# Patient Record
Sex: Male | Born: 1945
Health system: Southern US, Community
[De-identification: ages and names within clinical notes are randomized; demographics above are authoritative.]

## PROBLEM LIST (undated history)

## (undated) DIAGNOSIS — I10 Essential (primary) hypertension: Secondary | ICD-10-CM

## (undated) DIAGNOSIS — I509 Heart failure, unspecified: Secondary | ICD-10-CM

## (undated) DIAGNOSIS — H269 Unspecified cataract: Secondary | ICD-10-CM

## (undated) DIAGNOSIS — M1990 Unspecified osteoarthritis, unspecified site: Secondary | ICD-10-CM

## (undated) DIAGNOSIS — Z87442 Personal history of urinary calculi: Secondary | ICD-10-CM

## (undated) DIAGNOSIS — I4892 Unspecified atrial flutter: Secondary | ICD-10-CM

## (undated) DIAGNOSIS — Q211 Atrial septal defect, unspecified: Secondary | ICD-10-CM

## (undated) DIAGNOSIS — K625 Hemorrhage of anus and rectum: Secondary | ICD-10-CM

## (undated) DIAGNOSIS — F419 Anxiety disorder, unspecified: Secondary | ICD-10-CM

## (undated) DIAGNOSIS — J479 Bronchiectasis, uncomplicated: Secondary | ICD-10-CM

## (undated) DIAGNOSIS — E039 Hypothyroidism, unspecified: Secondary | ICD-10-CM

## (undated) DIAGNOSIS — K219 Gastro-esophageal reflux disease without esophagitis: Secondary | ICD-10-CM

## (undated) DIAGNOSIS — N200 Calculus of kidney: Secondary | ICD-10-CM

## (undated) DIAGNOSIS — T7840XA Allergy, unspecified, initial encounter: Secondary | ICD-10-CM

## (undated) DIAGNOSIS — K5790 Diverticulosis of intestine, part unspecified, without perforation or abscess without bleeding: Secondary | ICD-10-CM

## (undated) DIAGNOSIS — D689 Coagulation defect, unspecified: Secondary | ICD-10-CM

## (undated) DIAGNOSIS — I499 Cardiac arrhythmia, unspecified: Secondary | ICD-10-CM

## (undated) DIAGNOSIS — T148XXA Other injury of unspecified body region, initial encounter: Secondary | ICD-10-CM

## (undated) DIAGNOSIS — M109 Gout, unspecified: Secondary | ICD-10-CM

## (undated) DIAGNOSIS — I43 Cardiomyopathy in diseases classified elsewhere: Secondary | ICD-10-CM

## (undated) DIAGNOSIS — E854 Organ-limited amyloidosis: Secondary | ICD-10-CM

## (undated) DIAGNOSIS — C801 Malignant (primary) neoplasm, unspecified: Secondary | ICD-10-CM

## (undated) DIAGNOSIS — J45909 Unspecified asthma, uncomplicated: Secondary | ICD-10-CM

## (undated) DIAGNOSIS — E785 Hyperlipidemia, unspecified: Secondary | ICD-10-CM

## (undated) HISTORY — DX: Hyperlipidemia, unspecified: E78.5

## (undated) HISTORY — PX: UPPER GASTROINTESTINAL ENDOSCOPY: SHX188

## (undated) HISTORY — DX: Essential (primary) hypertension: I10

## (undated) HISTORY — PX: POLYPECTOMY: SHX149

## (undated) HISTORY — DX: Diverticulosis of intestine, part unspecified, without perforation or abscess without bleeding: K57.90

## (undated) HISTORY — DX: Unspecified cataract: H26.9

## (undated) HISTORY — DX: Calculus of kidney: N20.0

## (undated) HISTORY — DX: Hemorrhage of anus and rectum: K62.5

## (undated) HISTORY — DX: Heart failure, unspecified: I50.9

## (undated) HISTORY — PX: ROTATOR CUFF REPAIR: SHX139

## (undated) HISTORY — PX: EYE SURGERY: SHX253

## (undated) HISTORY — DX: Unspecified osteoarthritis, unspecified site: M19.90

## (undated) HISTORY — DX: Hypothyroidism, unspecified: E03.9

## (undated) HISTORY — PX: CATARACT EXTRACTION: SUR2

## (undated) HISTORY — DX: Anxiety disorder, unspecified: F41.9

## (undated) HISTORY — PX: HERNIA REPAIR: SHX51

## (undated) HISTORY — DX: Allergy, unspecified, initial encounter: T78.40XA

## (undated) HISTORY — PX: COLONOSCOPY: SHX174

## (undated) HISTORY — DX: Coagulation defect, unspecified: D68.9

## (undated) HISTORY — DX: Cardiomyopathy in diseases classified elsewhere: I43

## (undated) HISTORY — PX: COLONOSCOPY, DIAGNOSTIC (SCREENING): SHX174

## (undated) SURGICAL SUPPLY — 1 items: POUCH AIGIS-R ANTIBACT PPM (Mesh General) ×1 IMPLANT

---

## 1996-01-08 ENCOUNTER — Emergency Department: Admit: 1996-01-08 | Payer: Self-pay | Admitting: Emergency Medicine

## 1998-11-08 ENCOUNTER — Ambulatory Visit (HOSPITAL_BASED_OUTPATIENT_CLINIC_OR_DEPARTMENT_OTHER): Admission: RE | Admit: 1998-11-08 | Discharge: 1998-11-08 | Payer: Self-pay | Admitting: General Surgery

## 1998-12-08 ENCOUNTER — Emergency Department: Admit: 1998-12-08 | Payer: Self-pay | Admitting: Emergency Medicine

## 2000-01-02 ENCOUNTER — Ambulatory Visit
Admit: 2000-01-02 | Disposition: A | Discharge status: Home / Self Care | Payer: Self-pay | Source: Ambulatory Visit | Admitting: Emergency Medicine

## 2000-02-04 ENCOUNTER — Ambulatory Visit (HOSPITAL_BASED_OUTPATIENT_CLINIC_OR_DEPARTMENT_OTHER): Admission: RE | Admit: 2000-02-04 | Discharge: 2000-02-04 | Payer: Self-pay | Admitting: Orthopedic Surgery

## 2000-03-05 ENCOUNTER — Other Ambulatory Visit: Admission: RE | Admit: 2000-03-05 | Discharge: 2000-03-05 | Payer: Self-pay | Admitting: Urology

## 2000-05-22 ENCOUNTER — Ambulatory Visit (INDEPENDENT_AMBULATORY_CARE_PROVIDER_SITE_OTHER)
Admit: 2000-05-22 | Disposition: A | Discharge status: Home / Self Care | Payer: Self-pay | Source: Ambulatory Visit | Admitting: Emergency Medicine

## 2001-10-27 ENCOUNTER — Ambulatory Visit: Admit: 2001-10-27 | Disposition: A | Discharge status: Home / Self Care | Payer: Self-pay | Source: Ambulatory Visit

## 2001-12-23 ENCOUNTER — Ambulatory Visit (INDEPENDENT_AMBULATORY_CARE_PROVIDER_SITE_OTHER)
Admit: 2001-12-23 | Disposition: A | Discharge status: Home / Self Care | Payer: Self-pay | Source: Ambulatory Visit | Admitting: Emergency Medicine

## 2002-07-16 ENCOUNTER — Ambulatory Visit (INDEPENDENT_AMBULATORY_CARE_PROVIDER_SITE_OTHER)
Admit: 2002-07-16 | Disposition: A | Discharge status: Home / Self Care | Payer: Self-pay | Source: Ambulatory Visit | Admitting: Emergency Medicine

## 2004-09-18 ENCOUNTER — Emergency Department (HOSPITAL_COMMUNITY): Admission: EM | Admit: 2004-09-18 | Discharge: 2004-09-18 | Payer: Self-pay | Admitting: Family Medicine

## 2004-12-06 ENCOUNTER — Ambulatory Visit (HOSPITAL_COMMUNITY): Admission: RE | Admit: 2004-12-06 | Discharge: 2004-12-06 | Payer: Self-pay | Admitting: Surgery

## 2005-01-01 ENCOUNTER — Emergency Department: Admit: 2005-01-01 | Payer: Self-pay | Source: Emergency Department | Admitting: Emergency Medicine

## 2008-10-27 ENCOUNTER — Emergency Department: Admit: 2008-10-27 | Payer: Self-pay | Source: Emergency Department | Admitting: Emergency Medicine

## 2008-10-28 LAB — LYME DISEASE SEROLOGY, S-SOFT

## 2009-11-14 ENCOUNTER — Emergency Department: Admit: 2009-11-14 | Payer: Self-pay | Source: Emergency Department | Admitting: Emergency Medicine

## 2010-02-20 ENCOUNTER — Ambulatory Visit: Payer: Self-pay

## 2010-02-21 LAB — LAB USE ONLY - HISTORICAL SURGICAL PATHOLOGY

## 2011-06-10 ENCOUNTER — Ambulatory Visit (INDEPENDENT_AMBULATORY_CARE_PROVIDER_SITE_OTHER): Payer: 59

## 2011-06-10 DIAGNOSIS — N410 Acute prostatitis: Secondary | ICD-10-CM

## 2011-06-13 ENCOUNTER — Encounter (INDEPENDENT_AMBULATORY_CARE_PROVIDER_SITE_OTHER): Payer: Self-pay | Admitting: Surgery

## 2011-06-13 ENCOUNTER — Ambulatory Visit (INDEPENDENT_AMBULATORY_CARE_PROVIDER_SITE_OTHER): Payer: 59 | Admitting: Surgery

## 2011-06-13 VITALS — BP 132/82 | HR 68 | Temp 97.8°F | Resp 16 | Ht 76.0 in | Wt 202.0 lb

## 2011-06-13 DIAGNOSIS — K648 Other hemorrhoids: Secondary | ICD-10-CM | POA: Insufficient documentation

## 2011-06-13 NOTE — Progress Notes (Signed)
Chief Complaint:  Bleeding hemorrhoids  History of Present Illness:  Carlos Choi is an 65 y.o. male is been experiencing bleeding with defecation. He has had a colonoscopy. He was seen by Dr. Dorothey Baseman who referred him with an internal hemorrhoid bleeding. He takes a baby aspirin a day. He has not had any pain but mainly bleeding and difficulty with wiping himself.  Past Medical History  Diagnosis Date  . Hemorrhoids   . Hyperlipidemia     mild form - no medication required as of 06/13/11 appt  . Rectal bleeding     due to hemorrhoid     Past Surgical History  Procedure Date  . Hernia repair     Over ten years ago per patient. He was not sure of the date.  . Rotator cuff repair     both shoulders - patient does not remember the exact date    Current Outpatient Prescriptions  Medication Sig Dispense Refill  . aspirin 81 MG tablet Take 81 mg by mouth every other day.        . ciprofloxacin (CIPRO) 500 MG tablet Twice daily.      [name] doxycycline (VIBRA-TABS) 100 MG tablet Daily.      . Multiple Vitamins-Minerals (CENTRUM SILVER PO) Take by mouth daily.        [name] NASONEX 50 MCG/ACT nasal spray as needed.      . Omega-3 Fatty Acids (FISH OIL) 1000 MG CAPS Take by mouth 3 (three) times daily.        . ramipril (ALTACE) 5 MG capsule Daily.      [name] SYNTHROID 50 MCG tablet Twice daily.       Review of patient's allergies indicates no known allergies. Family History  Problem Relation Age of Onset  . Aneurysm Mother   . Alzheimer's disease Father   . Cancer Sister     ovarian   Social History:   reports that he quit smoking about 40 years ago. He has never used smokeless tobacco. He reports that he drinks alcohol. He reports that he does not use illicit drugs.   REVIEW OF SYSTEMS - PERTINENT POSITIVES ONLY: noncontributory  Physical Exam:   Blood pressure 132/82, pulse 68, temperature 97.8 F (36.6 C), temperature source Temporal, resp. rate 16, height 6\' 4"  (1.93 m),  weight 202 lb (91.627 kg). Body mass index is 24.59 kg/(m^2).  Gen:  WDWN white male NAD  Neurological: Alert and oriented to person, place, and time. Motor and sensory function is grossly intact  Head: Normocephalic and atraumatic.  Eyes: Conjunctivae are normal. Pupils are equal, round, and reactive to light. No scleral icterus.  Neck: Normal range of motion. Neck supple. No tracheal deviation or thyromegaly present.  Cardiovascular:  SR without murmurs or gallops.  No carotid bruits Respiratory: Effort normal.  No respiratory distress. No chest wall tenderness. Breath sounds normal.  No wheezes, rales or rhonchi.  Abdomen:  nontender GU:and a rectal exam showed on the right side there is an external hemorrhoid with a prominent internal component. I was able to reduce the period Musculoskeletal: Normal range of motion. Extremities are nontender. No cyanosis, edema or clubbing noted Lymphadenopathy: No cervical, preauricular, postauricular or axillary adenopathy is present Skin: Skin is warm and dry. No rash noted. No diaphoresis. No erythema. No pallor. Pscyh: Normal mood and affect. Behavior is normal. Judgment and thought content normal.   LABORATORY RESULTS: No results found for this or any previous visit (from the past 48  hour(s)).  RADIOLOGY RESULTS: No results found.  Problem List: Patient Active Problem List  Diagnoses  . Hemorrhoids, internal, with bleeding    Assessment & Plan: Prominent internal hemorrhoid on the right side was banded after informed consent was obtained. Double bands were applied. Post application rectal exam could palpate the handed hemorrhoidal tissue sitting just above the sphincter complex. Patient was having no difficulty with pain. We'll see him back in 4 weeks    Matt B. Daphine Deutscher, MD, [facility] [phone] beeper [phone]  06/13/2011 10:20 AM

## 2011-06-13 NOTE — Patient Instructions (Signed)
Use stool softener Avoid sitting for long periods on the toilet You may experience some bleeding after bands slough

## 2011-06-28 ENCOUNTER — Encounter (INDEPENDENT_AMBULATORY_CARE_PROVIDER_SITE_OTHER): Payer: Self-pay | Admitting: Surgery

## 2011-07-02 ENCOUNTER — Ambulatory Visit (INDEPENDENT_AMBULATORY_CARE_PROVIDER_SITE_OTHER): Payer: 59

## 2011-07-02 DIAGNOSIS — E039 Hypothyroidism, unspecified: Secondary | ICD-10-CM

## 2011-07-02 DIAGNOSIS — N410 Acute prostatitis: Secondary | ICD-10-CM

## 2012-03-19 ENCOUNTER — Other Ambulatory Visit: Payer: Self-pay | Admitting: Radiology

## 2012-03-21 ENCOUNTER — Other Ambulatory Visit: Payer: Self-pay

## 2012-03-23 ENCOUNTER — Other Ambulatory Visit: Payer: Self-pay | Admitting: *Deleted

## 2012-03-23 MED ORDER — RAMIPRIL 5 MG PO CAPS: 5.0000 mg | ORAL_CAPSULE | Freq: Every day | ORAL | Status: DC

## 2012-03-26 DIAGNOSIS — R972 Elevated prostate specific antigen [PSA]: Secondary | ICD-10-CM | POA: Insufficient documentation

## 2012-04-10 ENCOUNTER — Encounter: Payer: Self-pay | Admitting: Family Medicine

## 2012-04-10 DIAGNOSIS — R972 Elevated prostate specific antigen [PSA]: Secondary | ICD-10-CM

## 2012-07-06 ENCOUNTER — Other Ambulatory Visit: Payer: Self-pay | Admitting: Family Medicine

## 2012-07-06 DIAGNOSIS — Z0000 Encounter for general adult medical examination without abnormal findings: Secondary | ICD-10-CM

## 2012-07-16 ENCOUNTER — Encounter: Payer: Self-pay | Admitting: Family Medicine

## 2012-07-16 ENCOUNTER — Ambulatory Visit: Payer: 59 | Admitting: Family Medicine

## 2012-07-16 ENCOUNTER — Ambulatory Visit (INDEPENDENT_AMBULATORY_CARE_PROVIDER_SITE_OTHER): Payer: 59 | Admitting: Family Medicine

## 2012-07-16 VITALS — BP 132/84 | HR 82 | Temp 97.3°F | Resp 16 | Ht 76.0 in | Wt 205.0 lb

## 2012-07-16 DIAGNOSIS — K648 Other hemorrhoids: Secondary | ICD-10-CM

## 2012-07-16 DIAGNOSIS — K649 Unspecified hemorrhoids: Secondary | ICD-10-CM

## 2012-07-16 DIAGNOSIS — Z0000 Encounter for general adult medical examination without abnormal findings: Secondary | ICD-10-CM

## 2012-07-16 DIAGNOSIS — R040 Epistaxis: Secondary | ICD-10-CM

## 2012-07-16 DIAGNOSIS — E039 Hypothyroidism, unspecified: Secondary | ICD-10-CM

## 2012-07-16 LAB — POCT URINALYSIS DIPSTICK
Bilirubin, UA: NEGATIVE
Blood, UA: NEGATIVE
Glucose, UA: NEGATIVE
Ketones, UA: NEGATIVE
Leukocytes, UA: NEGATIVE
Nitrite, UA: NEGATIVE
Protein, UA: NEGATIVE
Spec Grav, UA: 1.025
Urobilinogen, UA: 0.2
pH, UA: 6

## 2012-07-16 LAB — CBC
HCT: 47.7 % (ref 39.0–52.0)
Hemoglobin: 16.7 g/dL (ref 13.0–17.0)
MCH: 31.3 pg (ref 26.0–34.0)
MCHC: 35 g/dL (ref 30.0–36.0)
MCV: 89.5 fL (ref 78.0–100.0)
Platelets: 180 10*3/uL (ref 150–400)
RBC: 5.33 MIL/uL (ref 4.22–5.81)
RDW: 13.2 % (ref 11.5–15.5)
WBC: 5.5 10*3/uL (ref 4.0–10.5)

## 2012-07-16 LAB — COMPREHENSIVE METABOLIC PANEL
ALT: 13 U/L (ref 0–53)
AST: 16 U/L (ref 0–37)
Albumin: 4.5 g/dL (ref 3.5–5.2)
Alkaline Phosphatase: 65 U/L (ref 39–117)
BUN: 19 mg/dL (ref 6–23)
CO2: 27 mEq/L (ref 19–32)
Calcium: 9.3 mg/dL (ref 8.4–10.5)
Chloride: 107 mEq/L (ref 96–112)
Creat: 1.07 mg/dL (ref 0.50–1.35)
Glucose, Bld: 92 mg/dL (ref 70–99)
Potassium: 4.7 mEq/L (ref 3.5–5.3)
Sodium: 141 mEq/L (ref 135–145)
Total Bilirubin: 0.7 mg/dL (ref 0.3–1.2)
Total Protein: 7.1 g/dL (ref 6.0–8.3)

## 2012-07-16 LAB — PSA: PSA: 1.96 ng/mL (ref <=4.00)

## 2012-07-16 LAB — LIPID PANEL
Cholesterol: 211 mg/dL — ABNORMAL HIGH (ref 0–200)
HDL: 40 mg/dL (ref >39)
LDL Cholesterol: 144 mg/dL — ABNORMAL HIGH (ref 0–99)
Total CHOL/HDL Ratio: 5.3 Ratio
Triglycerides: 136 mg/dL (ref <150)
VLDL: 27 mg/dL (ref 0–40)

## 2012-07-16 LAB — TSH: TSH: 0.901 u[IU]/mL (ref 0.350–4.500)

## 2012-07-16 MED ORDER — HYDROCORTISONE 2.5 % RE CREA: TOPICAL_CREAM | Freq: Two times a day (BID) | RECTAL | Status: DC

## 2012-07-16 NOTE — Progress Notes (Signed)
Phone [phone] Patient here for follow up labs.  He's had two prostate biopsies which were negative after PSA elevated slightly twice in the past.  Bleeding more easily.  Some epistaxis, bleeds more when he has minor cut, some hemorrhoidal bleeding.  No constipation.  Neg f/h cirrhosis

## 2012-07-17 ENCOUNTER — Encounter: Payer: Self-pay | Admitting: Family Medicine

## 2012-07-17 ENCOUNTER — Telehealth: Payer: Self-pay | Admitting: Family Medicine

## 2012-07-17 NOTE — Progress Notes (Signed)
I am completing a note from yesterday:  We discussed refilling patient's prescriptions of synthroid and altace.  Patient also notes some rectal bleeding.  He has had hemorrhoidal banding in the past, and is up to date on colonoscopy.  He has no anal pain or constipation.  He also notes no swelling.  Objective: NAD Exam of anus reveals some swollen, superficial veins, but no ectatic hemorrhoidal tissue. HEENT:  Small faint bilateral cataracts, normal fundi, normal oroph, normal ear canals, normal TM's Skin:  Fair without suspicious lesions Neck:  Supple without bruit or thyromegaly Chest:  Clear Heart:  Reg, no murmur Abdomen:  Soft, nontender, no HSM, no masses  Assessment:  Stable BP, stable thyroid, proctitis  Plan: TSH, CMET, U/A, cholesterol pending anusol cream for proctitis.  Results for orders placed in visit on 07/16/12  POCT URINALYSIS DIPSTICK      Component Value Range   Color, UA yellow     Clarity, UA clear     Glucose, UA neg     Bilirubin, UA neg     Ketones, UA neg     Spec Grav, UA 1.025     Blood, UA neg     pH, UA 6.0     Protein, UA neg     Urobilinogen, UA 0.2     Nitrite, UA neg     Leukocytes, UA Negative    CBC      Component Value Range   WBC 5.5  4.0 - 10.5 K/uL   RBC 5.33  4.22 - 5.81 MIL/uL   Hemoglobin 16.7  13.0 - 17.0 g/dL   HCT 16.1  09.6 - 04.5 %   MCV 89.5  78.0 - 100.0 fL   MCH 31.3  26.0 - 34.0 pg   MCHC 35.0  30.0 - 36.0 g/dL   RDW 40.9  81.1 - 91.4 %   Platelets 180  150 - 400 K/uL  COMPREHENSIVE METABOLIC PANEL      Component Value Range   Sodium 141  135 - 145 mEq/L   Potassium 4.7  3.5 - 5.3 mEq/L   Chloride 107  96 - 112 mEq/L   CO2 27  19 - 32 mEq/L   Glucose, Bld 92  70 - 99 mg/dL   BUN 19  6 - 23 mg/dL   Creat 7.82  9.56 - 2.13 mg/dL   Total Bilirubin 0.7  0.3 - 1.2 mg/dL   Alkaline Phosphatase 65  39 - 117 U/L   AST 16  0 - 37 U/L   ALT 13  0 - 53 U/L   Total Protein 7.1  6.0 - 8.3 g/dL   Albumin 4.5  3.5 - 5.2  g/dL   Calcium 9.3  8.4 - 08.6 mg/dL  LIPID PANEL      Component Value Range   Cholesterol 211 (*) 0 - 200 mg/dL   Triglycerides 578  <469 mg/dL   HDL 40  >62 mg/dL   Total CHOL/HDL Ratio 5.3     VLDL 27  0 - 40 mg/dL   LDL Cholesterol 952 (*) 0 - 99 mg/dL  TSH      Component Value Range   TSH 0.901  0.350 - 4.500 uIU/mL  PSA      Component Value Range   PSA 1.96  <=4.00 ng/mL

## 2012-07-17 NOTE — Telephone Encounter (Signed)
RX for Altace 5 mg & Synthroid 50 mcg faxed to [facility] per Dr. Elbert Ewings.

## 2012-07-27 ENCOUNTER — Telehealth: Payer: Self-pay

## 2012-07-27 NOTE — Telephone Encounter (Signed)
I do see where pt was previously on of levothyroxine in the past.  Ok to change dose, suspect that Dr. Elbert Ewings wrote in error as he did not document otherwise

## 2012-07-27 NOTE — Telephone Encounter (Signed)
Pt called to report that when he received his levothyroxine Rx from [facility] it was for the incorrect dosage. He was increased from 50 mcg to 100 mcg a year ago, and when Dr L saw him 07/17/12, the Rx was sent in for 50 mcg. Pt will take two tabs of the 50 mcg he has until they run out. He requests that another 90 day Rx be sent in for the 50 mcg QD to [facility] [place] (he will just pay OOP), which pt will actually cont to take 2 tabs QD for another 1 1/2 mos so that he will be on the same schedule w/his other med at [facility]. Then he would like the corrected Rx for 100 mcg #90 to be sent to [facility] w/2 add'l RFs w/a note to replace the incorrect RFs they have on file. Can I do this?  Pt's chart [id number] is at PA desk w/handwritten copies of Rxs just written for pt and also OV notes of change to 100 mcg.

## 2012-07-28 MED ORDER — LEVOTHYROXINE SODIUM 100 MCG PO TABS: 100.0000 ug | ORAL_TABLET | Freq: Every day | ORAL | Status: DC

## 2012-07-28 NOTE — Telephone Encounter (Signed)
Contacted pt and advised that we are sending in the corrected Rx to [facility] w/a note to replace the RFs of 50 mcg they have on file and to hold new Rx until pt places next order. Also d/w pt that we are going to call in a 90 day Rx for the new correct dose of 100 mcg to [facility] and pt will just pay OOP for this on $10 plan. Pt thanked [place]. I have sent both Rxs in.

## 2012-07-28 NOTE — Telephone Encounter (Signed)
Carlos Choi, this is very confusing, he wants 50 mcg, but this is not his dose. I am unsure why he does not want which is the correct dose, Carlos Choi

## 2012-08-31 ENCOUNTER — Ambulatory Visit (INDEPENDENT_AMBULATORY_CARE_PROVIDER_SITE_OTHER): Payer: 59 | Admitting: Surgery

## 2012-09-04 ENCOUNTER — Encounter (INDEPENDENT_AMBULATORY_CARE_PROVIDER_SITE_OTHER): Payer: Self-pay | Admitting: Surgery

## 2012-09-04 ENCOUNTER — Ambulatory Visit (INDEPENDENT_AMBULATORY_CARE_PROVIDER_SITE_OTHER): Payer: Medicare Other | Admitting: Surgery

## 2012-09-04 VITALS — BP 130/72 | HR 88 | Temp 97.8°F | Resp 18 | Ht 76.0 in | Wt 203.6 lb

## 2012-09-04 DIAGNOSIS — K649 Unspecified hemorrhoids: Secondary | ICD-10-CM

## 2012-09-04 NOTE — Progress Notes (Signed)
Subjective:     Patient ID: Carlos Choi, male   DOB: 1945/11/17, 67 y.o.   MRN: [id number]  HPI patient presents for rectal bleeding and hemorrhoids.he was seen here in 2012 by Dr. Wenda Low who banded right complex for bleeding hemorrhoids and prolapse. He developed intermittent bleeding with bowel movements. No pain. Last colonoscopy 2007. The bleeding i stopped since last week.  Review of Systems  Constitutional: Negative.   HENT: Negative.   Gastrointestinal: Positive for anal bleeding.  Skin: Negative.        Objective:   Physical Exam  HENT:  Head: Normocephalic and atraumatic.  Eyes: Conjunctivae are normal. Pupils are equal, round, and reactive to light.  Genitourinary: Rectal exam shows internal hemorrhoid. Rectal exam shows no fissure and no tenderness.     Skin: Skin is warm and dry.       Assessment:     Grade 3 prolapse right internal hemorrhoid    Plan:     Discussed treatment options of observation versus injection versus banding. He agreed to injection today. He tolerated the procedure well. Postprocedure instructions given. Patient will call if any problem.

## 2012-09-04 NOTE — Patient Instructions (Signed)
expect mild bleeding.  Some mild pain normal but any fevers,  Chills or severe rectal pain call.

## 2012-09-15 DIAGNOSIS — I10 Essential (primary) hypertension: Secondary | ICD-10-CM | POA: Diagnosis not present

## 2012-09-30 DIAGNOSIS — I10 Essential (primary) hypertension: Secondary | ICD-10-CM | POA: Diagnosis not present

## 2012-11-10 ENCOUNTER — Telehealth: Payer: Self-pay

## 2012-11-10 NOTE — Telephone Encounter (Signed)
Pended please advise.  

## 2012-11-10 NOTE — Telephone Encounter (Signed)
Pt was seen earlier this year by Dr. Elbert Ewings and he later turned 87 and got a new insurance and he is needing a new prescription wrote for 2 of his medications for remaining 9 months Call back number is [phone]

## 2012-11-10 NOTE — Telephone Encounter (Signed)
Please let me know which two.  I can probably do it through [facility].  Also, does he want 30 day or 90 day prescriptions?

## 2012-11-11 NOTE — Telephone Encounter (Signed)
I pended them, was Synthroid and Altace, need your approval to send in 9 mo supply, he wants 90 with refills, pended, you can sign or decline.

## 2012-11-17 NOTE — Telephone Encounter (Signed)
Pt's wife CB and stated that the synthroid and altace had been sent to [facility] but they have to verify the Altace d/t it being written/circled in a purple pen. Called and spoke to pharmacist at [facility] who verified they do have a Rx for both meds for 90 day supply plus 3 Rfs. I authorized the Rx for Dr Milus Glazier since both Rxs were signed by him.

## 2012-12-19 ENCOUNTER — Encounter
Admission: RE | Disposition: A | Discharge status: Home / Self Care | Payer: Self-pay | Source: Ambulatory Visit | Attending: Specialist

## 2012-12-19 ENCOUNTER — Ambulatory Visit: Payer: Self-pay

## 2012-12-19 ENCOUNTER — Ambulatory Visit: Payer: Medicare Other | Admitting: Pain Medicine

## 2012-12-19 ENCOUNTER — Ambulatory Visit: Payer: BC Managed Care – PPO | Admitting: Specialist

## 2012-12-19 ENCOUNTER — Ambulatory Visit
Admission: RE | Admit: 2012-12-19 | Discharge: 2012-12-21 | Disposition: A | Discharge status: Home / Self Care | Payer: Medicare Other | Source: Ambulatory Visit | Attending: Specialist | Admitting: Specialist

## 2012-12-19 ENCOUNTER — Encounter: Payer: Self-pay | Admitting: Pain Medicine

## 2012-12-19 DIAGNOSIS — M659 Unspecified synovitis and tenosynovitis, unspecified site: Secondary | ICD-10-CM | POA: Insufficient documentation

## 2012-12-19 DIAGNOSIS — D1779 Benign lipomatous neoplasm of other sites: Secondary | ICD-10-CM | POA: Insufficient documentation

## 2012-12-19 DIAGNOSIS — M66249 Spontaneous rupture of extensor tendons, unspecified hand: Secondary | ICD-10-CM | POA: Insufficient documentation

## 2012-12-19 DIAGNOSIS — M67431 Ganglion, right wrist: Secondary | ICD-10-CM | POA: Diagnosis present

## 2012-12-19 DIAGNOSIS — J45909 Unspecified asthma, uncomplicated: Secondary | ICD-10-CM | POA: Insufficient documentation

## 2012-12-19 DIAGNOSIS — M6740 Ganglion, unspecified site: Secondary | ICD-10-CM

## 2012-12-19 DIAGNOSIS — M66239 Spontaneous rupture of extensor tendons, unspecified forearm: Secondary | ICD-10-CM | POA: Insufficient documentation

## 2012-12-19 DIAGNOSIS — M129 Arthropathy, unspecified: Secondary | ICD-10-CM | POA: Insufficient documentation

## 2012-12-19 HISTORY — PX: INJECTION, MEDICATION: SHX4316

## 2012-12-19 HISTORY — DX: Unspecified osteoarthritis, unspecified site: M19.90

## 2012-12-19 HISTORY — PX: EXCISION, GANGLION: SHX3971

## 2012-12-19 HISTORY — DX: Unspecified asthma, uncomplicated: J45.909

## 2012-12-19 SURGERY — EXCISION, GANGLION
Anesthesia: Anesthesia General | Site: Wrist | Laterality: Right | Wound class: Clean

## 2012-12-19 MED ORDER — FENTANYL CITRATE 0.05 MG/ML IJ SOLN
INTRAMUSCULAR | Status: DC | PRN
Start: 2012-12-19 — End: 2012-12-19
  Administered 2012-12-19: 25 ug via INTRAVENOUS

## 2012-12-19 MED ORDER — TRIAMCINOLONE ACETONIDE 40 MG/ML IJ SUSP
INTRAMUSCULAR | Status: DC | PRN
Start: 2012-12-19 — End: 2012-12-19
  Administered 2012-12-19: 80 mg

## 2012-12-19 MED ORDER — FENTANYL CITRATE 0.05 MG/ML IJ SOLN
INTRAMUSCULAR | Status: AC
Start: 2012-12-19 — End: ?
  Filled 2012-12-19: qty 5

## 2012-12-19 MED ORDER — LACTATED RINGERS IV SOLN
INTRAVENOUS | Status: DC | PRN
Start: 2012-12-19 — End: 2013-01-06

## 2012-12-19 MED ORDER — DEXAMETHASONE SODIUM PHOSPHATE 4 MG/ML IJ SOLN (WRAP)
INTRAMUSCULAR | Status: DC | PRN
Start: 2012-12-19 — End: 2012-12-19
  Administered 2012-12-19: 8 mg via INTRAVENOUS

## 2012-12-19 MED ORDER — LIDOCAINE HCL 2 % IJ SOLN
INTRAMUSCULAR | Status: DC | PRN
Start: 2012-12-19 — End: 2012-12-19
  Administered 2012-12-19: 60 mg

## 2012-12-19 MED ORDER — SODIUM CHLORIDE 0.9 % IV MBP
1.0000 g | Freq: Three times a day (TID) | INTRAVENOUS | Status: DC
Start: 2012-12-19 — End: 2013-01-06
  Administered 2012-12-19: 1 g via INTRAVENOUS

## 2012-12-19 MED ORDER — BUPIVACAINE HCL (PF) 0.5 % IJ SOLN
INTRAMUSCULAR | Status: AC
Start: 2012-12-19 — End: ?
  Filled 2012-12-19: qty 30

## 2012-12-19 MED ORDER — HYDROMORPHONE HCL PF 1 MG/ML IJ SOLN
INTRAMUSCULAR | Status: DC | PRN
Start: 2012-12-19 — End: 2012-12-19
  Administered 2012-12-19: .2 mg via INTRAVENOUS

## 2012-12-19 MED ORDER — PROMETHAZINE HCL 25 MG/ML IJ SOLN
6.2500 mg | Freq: Once | INTRAMUSCULAR | Status: DC | PRN
Start: 2012-12-19 — End: 2013-01-06

## 2012-12-19 MED ORDER — MIDAZOLAM HCL 2 MG/2ML IJ SOLN
INTRAMUSCULAR | Status: AC
Start: 2012-12-19 — End: ?
  Filled 2012-12-19: qty 2

## 2012-12-19 MED ORDER — SODIUM CHLORIDE 0.9 % IR SOLN
Status: DC | PRN
Start: 2012-12-19 — End: 2012-12-19
  Administered 2012-12-19: 500 mL

## 2012-12-19 MED ORDER — KETOROLAC TROMETHAMINE 30 MG/ML IJ SOLN
INTRAMUSCULAR | Status: DC | PRN
Start: 2012-12-19 — End: 2012-12-19
  Administered 2012-12-19: 30 mg via INTRAVENOUS

## 2012-12-19 MED ORDER — BUPIVACAINE HCL 0.5 % IJ SOLN
INTRAMUSCULAR | Status: DC | PRN
Start: 2012-12-19 — End: 2012-12-19
  Administered 2012-12-19: 20 mL

## 2012-12-19 MED ORDER — OXYCODONE-ACETAMINOPHEN 5-325 MG PO TABS
1.0000 | ORAL_TABLET | Freq: Once | ORAL | Status: AC | PRN
Start: 2012-12-19 — End: 2012-12-19

## 2012-12-19 MED ORDER — HYDROMORPHONE HCL PF 1 MG/ML IJ SOLN
INTRAMUSCULAR | Status: AC
Start: 2012-12-19 — End: ?
  Filled 2012-12-19: qty 1

## 2012-12-19 MED ORDER — ONDANSETRON HCL 4 MG/2ML IJ SOLN
INTRAMUSCULAR | Status: DC | PRN
Start: 2012-12-19 — End: 2012-12-19
  Administered 2012-12-19: 4 mg via INTRAVENOUS

## 2012-12-19 MED ORDER — MEPERIDINE HCL 25 MG/ML IJ SOLN
25.0000 mg | INTRAMUSCULAR | Status: DC | PRN
Start: 2012-12-19 — End: 2013-01-06

## 2012-12-19 MED ORDER — OXYCODONE-ACETAMINOPHEN 5-325 MG PO TABS
ORAL_TABLET | ORAL | Status: AC
Start: 2012-12-19 — End: 2012-12-19
  Administered 2012-12-19: 1 via ORAL
  Filled 2012-12-19: qty 1

## 2012-12-19 MED ORDER — DIPHENHYDRAMINE HCL 50 MG/ML IJ SOLN
6.2500 mg | Freq: Four times a day (QID) | INTRAMUSCULAR | Status: DC | PRN
Start: 2012-12-19 — End: 2013-01-06

## 2012-12-19 MED ORDER — ONDANSETRON HCL 4 MG/2ML IJ SOLN
4.0000 mg | Freq: Once | INTRAMUSCULAR | Status: DC | PRN
Start: 2012-12-19 — End: 2013-01-06

## 2012-12-19 MED ORDER — PROPOFOL INFUSION 10 MG/ML
INTRAVENOUS | Status: DC | PRN
Start: 2012-12-19 — End: 2012-12-19
  Administered 2012-12-19: 150 mg via INTRAVENOUS

## 2012-12-19 MED ORDER — TRIAMCINOLONE ACETONIDE 40 MG/ML IJ SUSP
INTRAMUSCULAR | Status: AC
Start: 2012-12-19 — End: ?
  Filled 2012-12-19: qty 2

## 2012-12-19 MED ORDER — FENTANYL CITRATE 0.05 MG/ML IJ SOLN
25.0000 ug | INTRAMUSCULAR | Status: DC | PRN
Start: 2012-12-19 — End: 2013-01-06

## 2012-12-19 MED ORDER — HYDROMORPHONE HCL PF 1 MG/ML IJ SOLN
0.2000 mg | INTRAMUSCULAR | Status: DC | PRN
Start: 2012-12-19 — End: 2013-01-06

## 2012-12-19 SURGICAL SUPPLY — 30 items
BANDAGE CMPR PLSTR CTTN CRTY CNFRM 75X3 (Bandage) ×3 IMPLANT
BANDAGE MEDIUM COMPRESSION L5 YD X W4 IN ELASTIC HOOK LOOP CLOSURE (Bandage) ×2 IMPLANT
BANDAGE MEDLINE MEDIUM COMPRESSION L5 YD (Bandage) ×2
BNDG MTRX CMPR 5YDX4IN MED PLSTR CTTN (Bandage) ×1
DRESSING ABDOMINAL ONE-SZ (Dressing) ×3 IMPLANT
DRESSING PETRO 3% BI 3BRM GZE XR 8X1IN (Dressing) ×1
DRESSING PETROLATUM XEROFORM L8 IN X W1 (Dressing) ×2
DRESSING PETROLATUM XEROFORM L8 IN X W1 IN 3% BISMUTH TRIBROMOPHENATE (Dressing) ×2 IMPLANT
GLOVE SURG BIOGEL INDIC SZ 6.5 (Glove) ×3 IMPLANT
GLOVE SURG BIOGEL SZ6.5 (Glove) ×3 IMPLANT
GOWN OPTIMA STRL BACK OR (Gown) ×3 IMPLANT
SOL IRR 0.9% NACL 500ML PLS PR BTL ISTNC (Irrigation Solutions) ×1
SOLUTION IRRIGATION 0.9% SDM CHLORIDE 500ML PR BTTL ISOTONIC NONPRGNC (Irrigation Solutions) ×2 IMPLANT
SOLUTION IRRIGATION 0.9% SODIUM CHLORIDE (Irrigation Solutions) ×2
SOLUTION SRGPRP 74% ISPRP 0.7% IOD (Prep) ×1
SOLUTION SURGICAL PREP 26 ML DURAPREP (Prep) ×2
SOLUTION SURGICAL PREP 26 ML DURAPREP 74% ISOPROPYL ALCOHOL 0.7% (Prep) ×2 IMPLANT
SPONGE GAUZE L4 IN X W4 IN 12 PLY (Sponge) ×4 IMPLANT
SPONGE GAUZE L4 IN X W4 IN 16 PLY (Dressing) ×2
SPONGE GAUZE L4 IN X W4 IN 16 PLY MAXIMUM ABSORBENT USP TYPE VII (Dressing) ×2 IMPLANT
SPONGE GZE CTTN CRTY 4X4IN LF NS 16 PLY (Dressing) ×1
SPONGE GZE PLS CTTN CRTY 4X4IN LF STRL (Sponge) ×2
STRIP SKIN CLOSURE L4 IN X W1/2 IN (Dressing) ×2
STRIP SKIN CLOSURE L4 IN X W1/2 IN REINFORCE STERI-STRIP POLYESTER (Dressing) ×2 IMPLANT
STRIP SKNCLS PLSTR STRSTRP 4X.5IN LF (Dressing) ×1
TOURNIQUET 18IN STRL (Procedure Accessories) ×3 IMPLANT
TOURNIQUET VASC STD DLP 5.5IN SET SNR (Procedure Accessories) ×1
TOURNIQUET VASCULAR L5.5 IN STANDARD SET (Procedure Accessories) ×2
TOURNIQUET VASCULAR L5.5 IN STANDARD SET SNARE TUBE DLP PEDIATRIC (Procedure Accessories) ×2 IMPLANT
TRAY HAND IAH (Pack) ×3 IMPLANT

## 2012-12-19 NOTE — Anesthesia Postprocedure Evaluation (Signed)
Anesthesia Post Evaluation    Patient: Gregory [name]    Procedures performed: Procedure(s) with comments:  EXCISION, GANGLION - injection right and left shoulder    Anesthesia type: General LMA    Patient location:Phase I PACU    Last vitals:   Filed Vitals:    12/19/12 1245   BP: 142/99   Pulse: 81   Temp:    Resp: 21   SpO2: 97%       Post pain: Patient not complaining of pain, continue current therapy      Mental Status:awake    Respiratory Function: tolerating room air    Cardiovascular: stable    Nausea/Vomiting: patient not complaining of nausea or vomiting    Hydration Status: adequate    Post assessment: no apparent anesthetic complications

## 2012-12-19 NOTE — Anesthesia Preprocedure Evaluation (Signed)
Anesthesia Evaluation    AIRWAY    Mallampati: I    TM distance: >3 FB  Neck ROM: full  Mouth Opening:full   CARDIOVASCULAR    cardiovascular exam normal       DENTAL    No notable dental hx     PULMONARY    pulmonary exam normal     OTHER FINDINGS                      Anesthesia Plan    ASA 2     general               (Chart reviewed, patient examined. Questions answered.  Risks, benefits and alternatives discussed with patient.  Patient states understanding and desires to proceed with anesthetic plan.  )      intravenous induction   Detailed anesthesia plan: general LMA            informed consent obtained    Plan discussed with CRNA.

## 2012-12-19 NOTE — H&P (Signed)
ADMISSION HISTORY AND PHYSICAL EXAM    Date Time: 12/19/2012 9:52 AM  Patient Name: Gregory [name] T  Attending Physician: Jillyn Hidden, MD    Assessment:   Right dorsal wrist ganglion    Plan:   Removal right dorsal wrist ganglion    History of Present Illness:   Gregory [name] is a 67 y.o. male who presents to the hospital with Right dorsal wrist ganglion    Past Medical History:     Past Medical History   Diagnosis Date   . Asthma without status asthmaticus      childhood   . Arthritis        Past Surgical History:     Past Surgical History   Procedure Date   . Colonoscopy    . Hernia repair        Family History:   History reviewed. No pertinent family history.    Social History:     History     Social History   . Marital Status: Married     Spouse Name: N/A     Number of Children: N/A   . Years of Education: N/A     Social History Main Topics   . Smoking status: Never Smoker    . Smokeless tobacco: Never Used   . Alcohol Use: Yes      Comment: socially   . Drug Use: No   . Sexually Active:      Other Topics Concern   . Not on file     Social History Narrative   . No narrative on file       Allergies:   No Known Allergies    Medications:     No prescriptions prior to admission       Review of Systems:   A comprehensive review of systems was: normal    Physical Exam:     Filed Vitals:    12/19/12 0904   BP: 126/69   Pulse: 83   Temp: 97.6 F (36.4 C)   Resp: 18   SpO2: 100%       Intake and Output Summary (Last 24 hours) at Date Time  No intake or output data in the 24 hours ending 12/19/12 0952    PE    GEN:  WDWNWM in NAD  HEENT:  Clear  Chest:  Clear  Cardiac:  No M's  ABD:  Soft  Neuro:  Normal  MSK:  Right wrist ganglion about 2 by 1 cm    Labs:             Rads:   Radiological Procedure reviewed.    Signed by: Jillyn Hidden

## 2012-12-19 NOTE — Op Note (Addendum)
Procedure Date: 12/19/2012     Patient Type: A     SURGEON: Jillyn Hidden MD  ASSISTANT:       ASSISTANT:  Espina     PREOPERATIVE DIAGNOSIS:  Right wrist ganglion.     POSTOPERATIVE DIAGNOSIS:  Right wrist ganglion (outpouching dorsal capsule) with partial rupture of extensor tendon and lipoma,  right wrist.     TITLE OF PROCEDURE:  Debridement, extensor tendon, removal of lipoma, and capsulotomy of the  wrist.     ANESTHESIA:  General.     INDICATION FOR OPERATION:  This is a 66 year old male who felt something snap in his wrist and saw a  large swelling in the dorsum of the wrist in the area typical for ganglion.   X-rays were normal in his wrist.     DESCRIPTION OF PROCEDURE:  Under adequate general anesthesia, the patient was prepped and draped in  the usual sterile fashion.  After exsanguinating the right upper extremity,  an arm tourniquet was inflated.  [name] wrist was entered through an  incision over the wrist creases and continued down through skin and  subcutaneous tissue.  There was an obvious lipoma that bulged forth next to  the extensor retinaculum; this was removed in toto. There was fluid leaking from under the extensor retinaculum.  The extensor retinaculum was separated without  cutting the fibers in order to see where the ganglion fluid was coming  from. The dorsal wrist capsule bulged somewhat (forming a potential ganglion); the capsule was opened.  There was 1 of the extensor tendons that was partially ruptured  longitudinally, not in half, but longitudinally, and from this area was a  lot of wrist fluid.  [name] wrist extensor was debrided.  The joint  was then opened in order to decrease the chances of any fluid coming from  the ganglion.  The wound was irrigated and then closed using 2-0 undyed  Vicryl on the subcutaneous tissue and a running Monocryl suture on the skin  and was augmented with Mastisol and Steri-Strips.  The wound was  infiltrated with Marcaine and the  patient's wrist was wrapped in a dorsal  wrist splint that was well-padded.           D:  12/19/2012 11:59 AM by Dr. Jillyn Hidden, MD ([id number])  T:  12/19/2012 12:53 PM by       Lorin Glass: [id number]) (Doc ID: [id number])

## 2012-12-19 NOTE — Transfer of Care (Signed)
Anesthesia Transfer of Care Note    Patient: Gregory [name]    Procedures performed: Procedure(s) with comments:  EXCISION, GANGLION - injection right and left shoulder    Anesthesia type: General LMA    Patient location:Phase I PACU    Last vitals:   Filed Vitals:    12/19/12 1124   BP: 144/69   Pulse: 100   Temp: 96.9 F (36.1 C)   Resp: 16   SpO2: 100%       Post pain: Patient not complaining of pain, continue current therapy      Mental Status:sedated    Respiratory Function: tolerating face mask    Cardiovascular: stable    Nausea/Vomiting: patient not complaining of nausea or vomiting    Hydration Status: adequate    Post assessment: no apparent anesthetic complications

## 2012-12-19 NOTE — Discharge Instructions (Signed)
Call if  Too much pain  Come for appointment Monday 0800      Post Anesthesia Discharge Instructions    Although you may be awake and alert in the recovery room, small amounts of anesthetic remain in your system for about 24 hours.  You may feel tired and sleepy during this time.      You are advised to go directly home from the hospital.    Plan to stay at home and rest for the remainder of the day.    It is advisable to have someone with you at home for 24 hours after surgery.    Do not operate a motor vehicle, or any mechanical or electrical equipment for the next 24 hours.      Be careful when you are walking around, you may become dizzy.  The effects of anesthesia and/or medications are still present and drowsiness may occur    Do not consume alcohol, tranquilizers, sleeping medications, or any other non prescribed medication for the remainder of the day.    Diet:  begin with liquids, progress your diet as tolerated or as directed by your surgeon.  Nausea and vomiting may occur in the next 24 hours.

## 2012-12-21 ENCOUNTER — Encounter: Payer: Self-pay | Admitting: Specialist

## 2012-12-21 DIAGNOSIS — M67431 Ganglion, right wrist: Secondary | ICD-10-CM | POA: Diagnosis present

## 2012-12-23 ENCOUNTER — Other Ambulatory Visit: Payer: Self-pay | Admitting: Family Medicine

## 2012-12-23 LAB — LAB USE ONLY - HISTORICAL SURGICAL PATHOLOGY

## 2013-02-02 ENCOUNTER — Ambulatory Visit (INDEPENDENT_AMBULATORY_CARE_PROVIDER_SITE_OTHER): Payer: Medicare Other | Admitting: Family Medicine

## 2013-02-02 ENCOUNTER — Encounter: Payer: Self-pay | Admitting: Family Medicine

## 2013-02-02 VITALS — BP 128/78 | HR 74 | Temp 98.3°F | Resp 16 | Ht 76.0 in | Wt 198.8 lb

## 2013-02-02 DIAGNOSIS — R972 Elevated prostate specific antigen [PSA]: Secondary | ICD-10-CM

## 2013-02-02 DIAGNOSIS — K5289 Other specified noninfective gastroenteritis and colitis: Secondary | ICD-10-CM

## 2013-02-02 MED ORDER — DOXYCYCLINE HYCLATE 100 MG PO TABS: 100.0000 mg | ORAL_TABLET | Freq: Two times a day (BID) | ORAL | Status: DC

## 2013-02-02 MED ORDER — METRONIDAZOLE 250 MG PO TABS: 250.0000 mg | ORAL_TABLET | Freq: Three times a day (TID) | ORAL | Status: DC

## 2013-02-02 NOTE — Progress Notes (Signed)
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  He has tried Culturelle with only minimal benefit.  He finds that alkaseltzer plus gives temporary relief.  We also reviewed his prostate hx, which has involved two biopsy visits 10 years apart with no abnormalities found.  Nevertheless, the PSA has shown some elevations again.  He will be seeing the urologist in September again, having had a post void ultrasound in the past year which was also normal.  Patient has had some problems with hemorrhoids over past 18 month which seems unusual since he had no problems when he was working and traveling all over during his professional years.  Patient also notes some discomfort left ear and has had some increase in the sinus discharge despite the Nasonex, which has helped some.  Associated symptoms include some loss of sleep owing to the hyperactive bowels.  Patient plans on a big cruise through [place] in November.  Objective:  NAD.  Patient is cheerful and articulate HEENT:  Normal left ear Abdomen:  Hyperactive BS, no HSM or mass, nontender Skin:  No rash  Assessment: This gentleman has symptoms that are consistent with Giardia. Because this is difficult to diagnose, I have to presume that this is responsible for his GI symptoms. Indication he's having some elevations in his PSA which may very well be a mild prostatitis. I have to believe that the biopsy results are reliable. The upper respiratory symptoms may actually be related to be Giardia with some nocturnal reflux. Also, the Giardia may cause some portal hypertension which would result in increase probability of hemorrhoids.  Plan: Trial of metronidazole 250 3 times a day for week. Following this he can take the doxycycline for the prostate to try to get the PSA as well as possible. I asked him to continue the culture  row. Decided that it be a good idea to come back in September and review of symptoms at that time. We may need to get the gastroenterologist involved if the metronidazole doesn't take care of the problem. Other and unspecified noninfectious gastroenteritis and colitis(558.9) - Plan: metroNIDAZOLE (FLAGYL) 250 MG tablet  Elevated PSA - Plan: doxycycline (VIBRA-TABS) 100 MG tablet   Signed, Sheila Oats.D.

## 2013-02-02 NOTE — Patient Instructions (Addendum)
Giardiasis  Giardiasis is an infection of the small intestine with the parasite Giardia intestinalis. Giardia intestinalis cannot be seen with the naked eye. It is often found in unclean (contaminated) water.   CAUSES   Infection can be caused by drinking contaminated water. Giardia intestinalis can also be found in some tap water.  SYMPTOMS   An infection causes:  · Explosive, foul smelling, watery diarrhea.  · A Feeling of sickness in your stomach (nausea).  · Abdominal cramps and pain.  It takes about 1 to 2 weeks after ingesting infected water or food to get sick. The illness usually lasts 2 to 4 weeks. Infection in infants and children can be long lasting.  DIAGNOSIS   It can be diagnosed by stool exam. Blood tests may be needed.  TREATMENT   Medications can be given to shorten the course of the illness.  HOME CARE INSTRUCTIONS   · In areas of contamination, boil your water if possible. Filtering tap water in areas of contamination removes most Giardia. Cysts of Giardia Intestinalis are resistant to chlorine.  · Be careful handling soiled undergarments and diapers. If infection is present, it is easily passed by hand to mouth. Use good hand-washing techniques.  · Follow up with your caregiver as directed.  SEEK MEDICAL CARE IF:   You do not get better.  Document Released: 05/31/2000 Document Revised: 08/26/2011 Document Reviewed: 01/21/2008  ExitCare® Patient Information ©2014 [employer].

## 2013-02-25 DIAGNOSIS — I10 Essential (primary) hypertension: Secondary | ICD-10-CM | POA: Diagnosis not present

## 2013-03-28 DIAGNOSIS — Z23 Encounter for immunization: Secondary | ICD-10-CM | POA: Diagnosis not present

## 2013-03-31 DIAGNOSIS — Z23 Encounter for immunization: Secondary | ICD-10-CM | POA: Diagnosis not present

## 2013-04-06 ENCOUNTER — Ambulatory Visit (INDEPENDENT_AMBULATORY_CARE_PROVIDER_SITE_OTHER): Payer: Medicare Other | Admitting: Family Medicine

## 2013-04-06 VITALS — BP 122/68 | HR 82 | Temp 97.4°F | Resp 20 | Ht 75.5 in | Wt 196.6 lb

## 2013-04-06 DIAGNOSIS — K649 Unspecified hemorrhoids: Secondary | ICD-10-CM

## 2013-04-06 DIAGNOSIS — Z23 Encounter for immunization: Secondary | ICD-10-CM

## 2013-04-06 DIAGNOSIS — K529 Noninfective gastroenteritis and colitis, unspecified: Secondary | ICD-10-CM

## 2013-04-06 DIAGNOSIS — R0981 Nasal congestion: Secondary | ICD-10-CM

## 2013-04-06 DIAGNOSIS — K5289 Other specified noninfective gastroenteritis and colitis: Secondary | ICD-10-CM

## 2013-04-06 LAB — COMPREHENSIVE METABOLIC PANEL
ALT: 13 U/L (ref 0–53)
AST: 17 U/L (ref 0–37)
Albumin: 4.6 g/dL (ref 3.5–5.2)
Alkaline Phosphatase: 64 U/L (ref 39–117)
BUN: 16 mg/dL (ref 6–23)
CO2: 27 mEq/L (ref 19–32)
Calcium: 9.4 mg/dL (ref 8.4–10.5)
Chloride: 104 mEq/L (ref 96–112)
Creat: 1.05 mg/dL (ref 0.50–1.35)
Glucose, Bld: 101 mg/dL — ABNORMAL HIGH (ref 70–99)
Potassium: 4.7 mEq/L (ref 3.5–5.3)
Sodium: 138 mEq/L (ref 135–145)
Total Bilirubin: 1 mg/dL (ref 0.3–1.2)
Total Protein: 7.6 g/dL (ref 6.0–8.3)

## 2013-04-06 LAB — POCT CBC
Granulocyte percent: 64.6 %G (ref 37–80)
HCT, POC: 53.2 % (ref 43.5–53.7)
Hemoglobin: 17.9 g/dL (ref 14.1–18.1)
Lymph, poc: 1.6 (ref 0.6–3.4)
MCH, POC: 32.7 pg — AB (ref 27–31.2)
MCHC: 33.6 g/dL (ref 31.8–35.4)
MCV: 97.1 fL — AB (ref 80–97)
MID (cbc): 0.4 (ref 0–0.9)
MPV: 10.3 fL (ref 0–99.8)
POC Granulocyte: 3.6 (ref 2–6.9)
POC LYMPH PERCENT: 28.1 %L (ref 10–50)
POC MID %: 7.3 %M (ref 0–12)
Platelet Count, POC: 190 10*3/uL (ref 142–424)
RBC: 5.48 M/uL (ref 4.69–6.13)
RDW, POC: 13.6 %
WBC: 5.6 10*3/uL (ref 4.6–10.2)

## 2013-04-06 MED ORDER — ALPRAZOLAM 0.25 MG PO TABS: 0.2500 mg | ORAL_TABLET | Freq: Two times a day (BID) | ORAL | Status: DC | PRN

## 2013-04-06 MED ORDER — METRONIDAZOLE 500 MG PO TABS: 500.0000 mg | ORAL_TABLET | Freq: Three times a day (TID) | ORAL | Status: DC

## 2013-04-06 MED ORDER — HYDROCORTISONE 2.5 % RE CREA: TOPICAL_CREAM | Freq: Two times a day (BID) | RECTAL | Status: DC

## 2013-04-06 NOTE — Patient Instructions (Signed)
Stop Altace Get water checked for giardia Let me know Monday how things are going I have ordered a surgery referral to be done shortly Continue the probiotics   You got your flu shot today

## 2013-04-06 NOTE — Progress Notes (Addendum)
This is a 67 year old married man who comes in with recurrence of gastrointestinal distress. He's retired Medical illustrator and is planning a trip in mid November to the [place].  Patient was seen back in August of this year with similar symptoms: Belching, growling and stomach, decreased appetite, and unformed stools of urgency associated with dry throat. He usually goes to the bathroom several times in the morning and then is fine for the rest of the day with exception of the belching and upset stomach. He's had to wake up at night couple times with the symptoms as well.  Because of the loose stools in rapid fire in the morning, his hemorrhoids acting up. He's had this banded in the past. Now is having some bleeding from the hemorrhoid.  Patient is anxious about these symptoms because he's going to be on the trip and doesn't want to be an uncompromising position and lying somewhere. He's taken antianxiety meds in the past when his son and daughter were getting married, and this antianxiety medicine has helped him with IBS.  Patient's wife is healthy and having no symptoms whatsoever. Patient is on well water. He's lost 2 pounds since August. His appetite is diminished as well. The symptoms of gastroenteritis have surfaced in the last 2 weeks, having completely resolved after taking metronidazole last August.  Objective: HEENT: Unremarkable No acute distress Skin: Unremarkable Chest: Clear Heart: Regular Abdomen: Soft, nontender with hyperactive bowel sounds diffusely. No HSM or masses. Rectal exam: One half Centimeter violaceous hemorrhoid on the right side.  Assessment: I still think this patient has Giardia, particularly because he responded so well to the Flagyl in the past.  He does have a mild anxiety disorder which probably contributes to some of the symptoms. I want to make sure that the hemorrhoids under control before he goes to his cruise in November.  Plan: Metronidazole 500 3 times a  day x7 days, Anusol-HC cream twice a day, Xanax 0.25 when necessary twice a day. I'm referring patient to surgery to evaluate the hemorrhoid As the patient to check as well water and get back to me in next 5 days regarding his symptoms.  Gastroenteritis - Plan: Comprehensive metabolic panel, POCT CBC, ALPRAZolam (XANAX) 0.25 MG tablet, metroNIDAZOLE (FLAGYL) 500 MG tablet  Hemorrhoid - Plan: hydrocortisone (ANUSOL-HC) 2.5 % rectal cream, Ambulatory referral to General Surgery  Need for prophylactic vaccination and inoculation against influenza - Plan: Flu Vaccine QUAD 36+ mos IM  Signed, Elvina Sidle, MD   October 28:  He also has sinus drainage which he thinks may be contributing to the problem.  He has gotten better with the flagyl but some of the symptoms persist  Will call in Levaquin 500 qd x 7 days  Elvina Sidle, MD

## 2013-04-07 ENCOUNTER — Telehealth (INDEPENDENT_AMBULATORY_CARE_PROVIDER_SITE_OTHER): Payer: Self-pay

## 2013-04-07 NOTE — Telephone Encounter (Signed)
LMOM returning call. Dr Davina Poke does not have any openings. Plus he is a patient of Dr Daphine Deutscher so he needs to move appt up with Dr Daphine Deutscher if he is having more problems with hems.

## 2013-04-07 NOTE — Telephone Encounter (Signed)
Message copied by Brennan Bailey on Wed Apr 07, 2013  9:54 AM ------      Message from: Louie Casa      Created: Mon Apr 05, 2013  5:23 PM      Regarding: Dr. Albertina Sen[place] and early appt      Contact: [phone]       Patient called and has a question for Dr. Luisa Hart and if is possible to see him this week or before see Dr. Daphine Deutscher 04/28/13 because he is going on a cruise, please call him.      Thank you. ------

## 2013-04-13 ENCOUNTER — Ambulatory Visit (INDEPENDENT_AMBULATORY_CARE_PROVIDER_SITE_OTHER): Payer: Medicare Other | Admitting: Surgery

## 2013-04-13 ENCOUNTER — Encounter (INDEPENDENT_AMBULATORY_CARE_PROVIDER_SITE_OTHER): Payer: Self-pay | Admitting: Surgery

## 2013-04-13 VITALS — BP 120/72 | HR 84 | Temp 98.4°F | Resp 15 | Ht 76.0 in | Wt 195.6 lb

## 2013-04-13 DIAGNOSIS — K648 Other hemorrhoids: Secondary | ICD-10-CM

## 2013-04-13 MED ORDER — LEVOFLOXACIN 500 MG PO TABS: 500.0000 mg | ORAL_TABLET | Freq: Every day | ORAL | Status: DC

## 2013-04-13 NOTE — Patient Instructions (Signed)
ANORECTAL SURGERY: POST OP INSTRUCTIONS  1. Take your usually prescribed home medications unless otherwise directed. 2. DIET: Follow a light bland diet the first 24 hours after arrival home, such as soup, liquids, crackers, etc.  Be sure to include lots of fluids daily.  Avoid fast food or heavy meals as your are more likely to get nauseated.  Eat a low fat the next few days after surgery.   3. PAIN CONTROL: a. Pain is best controlled by a usual combination of three different methods TOGETHER: i. Ice/Heat ii. Over the counter pain medication iii. Prescription pain medication b. Most patients will experience some swelling and discomfort in the anus/rectal area. and incisions.  Ice packs or heat (30-60 minutes up to 6 times a day) will help. Use ice for the first few days to help decrease swelling and bruising, then switch to heat such as warm towels, sitz baths, warm baths, etc to help relax tight/sore spots and speed recovery.  Some people prefer to use ice alone, heat alone, alternating between ice & heat.  Experiment to what works for you.  Swelling and bruising can take several weeks to resolve.   c. It is helpful to take an over-the-counter pain medication regularly for the first few weeks.  Choose one of the following that works best for you: i. Naproxen (Aleve, etc)  Two 220mg tabs twice a day ii. Ibuprofen (Advil, etc) Three 200mg tabs four times a day (every meal & bedtime) iii. Acetaminophen (Tylenol, etc) 500-650mg four times a day (every meal & bedtime) d. A  prescription for pain medication (such as oxycodone, hydrocodone, etc) should be given to you upon discharge.  Take your pain medication as prescribed.  i. If you are having problems/concerns with the prescription medicine (does not control pain, nausea, vomiting, rash, itching, etc), please call us [phone] to see if we need to switch you to a different pain medicine that will work better for you and/or control your side effect  better. ii. If you need a refill on your pain medication, please contact your pharmacy.  They will contact our office to request authorization. Prescriptions will not be filled after 5 pm or on week-ends.  Use a Sitz Bath 4-8 times a day for relief A sitz bath is a warm water bath taken in the sitting position that covers only the hips and buttocks. It may be used for either healing or hygiene purposes. Sitz baths are also used to relieve pain, itching, or muscle spasms. The water may contain medicine. Moist heat will help you heal and relax.  HOME CARE INSTRUCTIONS  Take 3 to 4 sitz baths a day. 1. Fill the bathtub half full with warm water. 2. Sit in the water and open the drain a little. 3. Turn on the warm water to keep the tub half full. Keep the water running constantly. 4. Soak in the water for 15 to 20 minutes. 5. After the sitz bath, pat the affected area dry first. SEEK MEDICAL CARE IF:  You get worse instead of better. Stop the sitz baths if you get worse.   4. KEEP YOUR BOWELS REGULAR a. The goal is one bowel movement a day b. Avoid getting constipated.  Between the surgery and the pain medications, it is common to experience some constipation.  Increasing fluid intake and taking a fiber supplement (such as Metamucil, Citrucel, FiberCon, MiraLax, etc) 1-2 times a day regularly will usually help prevent this problem from occurring.  A mild   laxative (prune juice, Milk of Magnesia, MiraLax, etc) should be taken according to package directions if there are no bowel movements after 48 hours. c. Watch out for diarrhea.  If you have many loose bowel movements, simplify your diet to bland foods & liquids for a few days.  Stop any stool softeners and decrease your fiber supplement.  Switching to mild anti-diarrheal medications (Kayopectate, Pepto Bismol) can help.  If this worsens or does not improve, please call us.  5. Wound Care a. Remove your bandages the day after surgery.  Unless  discharge instructions indicate otherwise, leave your bandage dry and in place overnight.  Remove the bandage during your first bowel movement.   b. Allow the wound packing to fall out over the next few days.  You can trim exposed gauze / ribbon as it falls out.  You do not need to repack the wound unless instructed otherwise.  Wear an absorbent pad or soft cotton gauze in your underwear as needed to catch any drainage and help keep the area  c. Keep the area clean and dry.  Bathe / shower every day.  Keep the area clean by showering / bathing over the incision / wound.   It is okay to soak an open wound to help wash it.  Wet wipes or showers / gentle washing after bowel movements is often less traumatic than regular toilet paper. d. You may have some styrofoam-like soft packing in the rectum which will come out with the first bowel movement.  e. You will often notice bleeding with bowel movements.  This should slow down by the end of the first week of surgery f. Expect some drainage.  This should slow down, too, by the end of the first week of surgery.  Wear an absorbent pad or soft cotton gauze in your underwear until the drainage stops. 6. ACTIVITIES as tolerated:   a. You may resume regular (light) daily activities beginning the next day-such as daily self-care, walking, climbing stairs-gradually increasing activities as tolerated.  If you can walk 30 minutes without difficulty, it is safe to try more intense activity such as jogging, treadmill, bicycling, low-impact aerobics, swimming, etc. b. Save the most intensive and strenuous activity for last such as sit-ups, heavy lifting, contact sports, etc  Refrain from any heavy lifting or straining until you are off narcotics for pain control.   c. DO NOT PUSH THROUGH PAIN.  Let pain be your guide: If it hurts to do something, don't do it.  Pain is your body warning you to avoid that activity for another week until the pain goes down. d. You may drive when  you are no longer taking prescription pain medication, you can comfortably sit for long periods of time, and you can safely maneuver your car and apply brakes. e. You may have sexual intercourse when it is comfortable.  7. FOLLOW UP in our office a. Please call [facility] at [phone] to set up an appointment to see your surgeon in the office for a follow-up appointment approximately 2 weeks after your surgery. b. Make sure that you call for this appointment the day you arrive home to insure a convenient appointment time. 10. IF YOU HAVE DISABILITY OR FAMILY LEAVE FORMS, BRING THEM TO THE OFFICE FOR PROCESSING.  DO NOT GIVE THEM TO YOUR DOCTOR.        WHEN TO CALL US [phone]: 1. Poor pain control 2. Reactions / problems with new medications (rash/itching, nausea, etc)    3. Fever over 101.5 F (38.5 C) 4. Inability to urinate 5. Nausea and/or vomiting 6. Worsening swelling or bruising 7. Continued bleeding from incision. 8. Increased pain, redness, or drainage from the incision  The clinic staff is available to answer your questions during regular business hours (8:30am-5pm).  Please don't hesitate to call and ask to speak to one of our nurses for clinical concerns.   A surgeon from [facility] is always on call at the hospitals   If you have a medical emergency, go to the nearest emergency room or call 911.    [facility] [address], [address], [place], [place]  [address] ? MAIN: [phone] ? TOLL FREE: [phone] ? FAX [phone] [email or web address]  HEMORRHOIDS  The rectum is the last foot of your colon, and it naturally stretches to hold stool.  Hemorrhoidal piles are natural clusters of blood vessels that help the rectum and anal canal stretch to hold stool and allow bowel movements to eliminate feces.   Hemorrhoids are abnormally swollen blood vessels in the rectum.  Too much pressure in the rectum causes  hemorrhoids by forcing blood to stretch and bulge the walls of the veins, sometimes even rupturing them.  Hemorrhoids can become like varicose veins you might see on a person's legs.  Most people will develop a flare of hemorrhoids in their lifetime.  When bulging hemorrhoidal veins are irritated, they can swell, burn, itch, cause pain, and bleed.  Most flares will calm down gradually own within a few weeks.  However, once hemorrhoids are created, they are difficult to get rid of completely and tend to flare more easily than the first flare.   Fortunately, good habits and simple medical treatment usually control hemorrhoids well, and surgery is needed only in severe cases. Types of Hemorrhoids:  Internal hemorrhoids usually don't initially hurt or itch; they are deep inside the rectum and usually have no sensation. If they begin to push out (prolapse), pain and burning can occur.  However, internal hemorrhoids can bleed.  Anal bleeding should not be ignored since bleeding could come from a dangerous source like colorectal cancer, so persistent rectal bleeding should be investigated by a doctor, sometimes with a colonoscopy.  External hemorrhoids cause most of the symptoms - pain, burning, and itching. Nonirritated hemorrhoids can look like small skin tags coming out of the anus.   Thrombosed hemorrhoids can form when a hemorrhoid blood vessel bursts and causes the hemorrhoid to suddenly swell.  A purple blood clot can form in it and become an excruciatingly painful lump at the anus. Because of these unpleasant symptoms, immediate incision and drainage by a surgeon at an office visit can provide much relief of the pain.    PREVENTION Avoiding the most frequent causes listed below will prevent most cases of hemorrhoids: Constipation Hard stools Diarrhea  Constant sitting  Straining with bowel movements Sitting on the toilet for a long time  Severe coughing  episodes Pregnancy / Childbirth  Heavy  Lifting  Sometimes avoiding the above triggers is difficult:  How can you avoid sitting all day if you have a seated job? Also, we try to avoid coughing and diarrhea, but sometimes it's beyond your control.  Still, there are some practical hints to help: Keep the anal and genital area clean.  Moistened tissues such as flushable wet wipes are less irritating than toilet paper.  Using irrigating showers or bottle irrigation washing gently cleans this sensitive area.     Avoid dry toilet paper when cleaning after bowel movements.  . Keep the anal and genital area dry.  Lightly pat the rectal area dry.  Avoid rubbing.  Talcum or baby powders can help GET YOUR STOOLS SOFT.   This is the most important way to prevent irritated hemorrhoids.  Hard stools are like sandpaper to the anorectal canal and will cause more problems.  The goal: ONE SOFT BOWEL MOVEMENT A DAY!  BMs from every other day to 3 times a day is a tolerable range Treat coughing, diarrhea and constipation early since irritated hemorrhoids may soon follow.  If your main job activity is seated, always stand or walk during your breaks. Make it a point to stand and walk at least 5 minutes every hour and try to shift frequently in your chair to avoid direct rectal pressure.  Always exhale as you strain or lift. Don't hold your breath.  Do not delay or try to prevent a bowel movement when the urge is present. Exercise regularly (walking or jogging 60 minutes a day) to stimulate the bowels to move. No reading or other activity while on the toilet. If bowel movements take longer than 5 minutes, you are too constipated. AVOID CONSTIPATION Drink plenty of liquids (1 1/2 to 2 quarts of water and other fluids a day unless fluid restricted for another medical condition). Liquids that contain caffeine (coffee a, tea, soft drinks) can be dehydrating and should be avoided until constipation is controlled. Consider minimizing milk, as dairy products may be  constipating. Eat plenty of fiber (30g a day ideal, more if needed).  Fiber is the undigested part of plant food that passes into the colon, acting as "natures broom" to encourage bowel motility and movement.  Fiber can absorb and hold large amounts of water. This results in a larger, bulkier stool, which is soft and easier to pass.  Eating foods high in fiber - 12 servings - such as  Vegetables: Root (potatoes, carrots, turnips), Leafy green (lettuce, salad greens, celery, spinach), High residue (cabbage, broccoli, etc.) Fruit: Fresh, Dried (prunes, apricots, cherries), Stewed (applesauce)  Whole grain breads, pasta, whole wheat Bran cereals, muffins, etc. Consider adding supplemental bulking fiber which retains large volumes of water: Psyllium ground seeds (native plant from [place])--available as Metamucil, Konsyl, Effersyllium, Per Diem Fiber, or the less expensive generic forms.  Citrucel  (methylcellulose wood fiber) . FiberCon (Polycarbophil) Polyethylene Glycol - and "artificial" fiber commonly called Miralax or Glycolax.  It is helpful for people with gassy or bloated feelings with regular fiber Flax Seed - a less gassy natural fiber  Laxatives can be useful for a short period if constipation is severe Osmotics (Milk of Magnesia, Fleets Phospho-Soda, Magnesium Citrate)  Stimulants (Senokot,   Castor Oil,  Dulcolax, Ex-Lax)    Laxatives are not a good long-term solution as it can stress the bowels and cause too much mineral loss and dehydration.   Avoid taking laxatives for more than 7 days in a row.  AVOID DIARRHEA Switch to liquids and simpler foods for a few days to avoid stressing your intestines further. Avoid dairy products (especially milk & ice cream) for a short time.  The intestines often can lose the ability to digest lactose when stressed. Avoid foods that cause gassiness or bloating.  Typical foods include beans and other legumes, cabbage, broccoli, and dairy foods.   Every person has some sensitivity to other foods, so listen to your body and avoid those foods that trigger problems for   you. Adding fiber (Citrucel, Metamucil, FiberCon, Flax seed, Miralax) gradually can help thicken stools by absorbing excess fluid and retrain the intestines to act more normally.  Slowly increase the dose over a few weeks.  Too much fiber too soon can backfire and cause cramping & bloating. Probiotics (such as active yogurt, Align, etc) may help repopulate the intestines and colon with normal bacteria and calm down a sensitive digestive tract.  Most studies show it to be of mild help, though, and such products can be costly. Medicines: Bismuth subsalicylate (ex. Kayopectate, Pepto Bismol) every 30 minutes for up to 6 doses can help control diarrhea.  Avoid if pregnant. Loperamide (Immodium) can slow down diarrhea.  Start with two tablets (4mg total) first and then try one tablet every 6 hours.  Avoid if you are having fevers or severe pain.  If you are not better or start feeling worse, stop all medicines and call your doctor for advice Call your doctor if you are getting worse or not better.  Sometimes further testing (cultures, endoscopy, X-ray studies, bloodwork, etc) may be needed to help diagnose and treat the cause of the diarrhea. TREATMENT OF HEMORRHOID FLARE If these preventive measures fail, you must take action right away! Hemorrhoids are one condition that can be mild in the morning and become intolerable by nightfall. Most hemorrhoidal flares take several weeks to calm down.  These suggestions can help: Warm soaks.  This helps more than any topical medication.  Use up to 8 times a day.  Usually sitz baths or sitting in a warm bathtub helps.  Sitting on moist warm towels are helpful.  Switching to ice packs/cool compresses can be helpful  Use a Sitz Bath 4-8 times a day for relief A sitz bath is a warm water bath taken in the sitting position that covers only the hips and  buttocks. It may be used for either healing or hygiene purposes. Sitz baths are also used to relieve pain, itching, or muscle spasms. The water may contain medicine. Moist heat will help you heal and relax.  HOME CARE INSTRUCTIONS  Take 3 to 4 sitz baths a day. 6. Fill the bathtub half full with warm water. 7. Sit in the water and open the drain a little. 8. Turn on the warm water to keep the tub half full. Keep the water running constantly. 9. Soak in the water for 15 to 20 minutes. 10. After the sitz bath, pat the affected area dry first. SEEK MEDICAL CARE IF:  You get worse instead of better. Stop the sitz baths if you get worse.  Normalize your bowels.  Extremes of diarrhea or constipation will make hemorrhoids worse.  One soft bowel movement a day is the goal.  Fiber can help get your bowels regular Wet wipes instead of toilet paper Pain control with a NSAID such as ibuprofen (Advil) or naproxen (Aleve) or acetaminophen (Tylenol) around the clock.  Narcotics are constipating and should be minimized if possible Topical creams contain steroids (bydrocortisone) or local anesthetic (xylocaine) can help make pain and itching more tolerable.   EVALUATION If hemorrhoids are still causing problems, you could benefit by an evaluation by a surgeon.  The surgeon will obtain a history and examine you.  If hemorrhoids are diagnosed, some therapies can be offered in the office, usually with an anoscope into the less sensitive area of the rectum: -injection of hemorrhoids (sclerotherapy) can scar the blood vessels of the swollen/enlarged hemorrhoids to help shrink them down to   a more normal size -rubber banding of the enlarged hemorrhoids to help shrink them down to a more normal size -drainage of the blood clot causing a thrombosed hemorrhoid,  to relieve the severe pain   While 90% of the time such problems from hemorrhoids can be managed without preceding to surgery, sometimes the hemorrhoids require a  operation to control the problem (uncontrolled bleeding, prolapse, pain, etc.).   This involves being placed under general anesthesia where the surgeon can confirm the diagnosis and remove, suture, or staple the hemorrhoid(s).  Your surgeon can help you treat the problem appropriately.    GETTING TO GOOD BOWEL HEALTH. Irregular bowel habits such as constipation and diarrhea can lead to many problems over time.  Having one soft bowel movement a day is the most important way to prevent further problems.  The anorectal canal is designed to handle stretching and feces to safely manage our ability to get rid of solid waste (feces, poop, stool) out of our body.  BUT, hard constipated stools can act like ripping concrete bricks and diarrhea can be a burning fire to this very sensitive area of our body, causing inflamed hemorrhoids, anal fissures, increasing risk is perirectal abscesses, abdominal pain/bloating, an making irritable bowel worse.     The goal: ONE SOFT BOWEL MOVEMENT A DAY!  To have soft, regular bowel movements:    Drink at least 8 tall glasses of water a day.     Take plenty of fiber.  Fiber is the undigested part of plant food that passes into the colon, acting s "natures broom" to encourage bowel motility and movement.  Fiber can absorb and hold large amounts of water. This results in a larger, bulkier stool, which is soft and easier to pass. Work gradually over several weeks up to 6 servings a day of fiber (25g a day even more if needed) in the form of: o Vegetables -- Root (potatoes, carrots, turnips), leafy green (lettuce, salad greens, celery, spinach), or cooked high residue (cabbage, broccoli, etc) o Fruit -- Fresh (unpeeled skin & pulp), Dried (prunes, apricots, cherries, etc ),  or stewed ( applesauce)  o Whole grain breads, pasta, etc (whole wheat)  o Bran cereals    Bulking Agents -- This type of water-retaining fiber generally is easily obtained each day by one of the following:   o Psyllium bran -- The psyllium plant is remarkable because its ground seeds can retain so much water. This product is available as Metamucil, Konsyl, Effersyllium, Per Diem Fiber, or the less expensive generic preparation in drug and health food stores. Although labeled a laxative, it really is not a laxative.  o Methylcellulose -- This is another fiber derived from wood which also retains water. It is available as Citrucel. o Polyethylene Glycol - and "artificial" fiber commonly called Miralax or Glycolax.  It is helpful for people with gassy or bloated feelings with regular fiber o Flax Seed - a less gassy fiber than psyllium   No reading or other relaxing activity while on the toilet. If bowel movements take longer than 5 minutes, you are too constipated   AVOID CONSTIPATION.  High fiber and water intake usually takes care of this.  Sometimes a laxative is needed to stimulate more frequent bowel movements, but    Laxatives are not a good long-term solution as it can wear the colon out. o Osmotics (Milk of Magnesia, Fleets phosphosoda, Magnesium citrate, MiraLax, GoLytely) are safer than  o Stimulants (Senokot, Castor Oil, Dulcolax,   Ex Lax)    o Do not take laxatives for more than 7days in a row.    IF SEVERELY CONSTIPATED, try a Bowel Retraining Program: o Do not use laxatives.  o Eat a diet high in roughage, such as bran cereals and leafy vegetables.  o Drink six (6) ounces of prune or apricot juice each morning.  o Eat two (2) large servings of stewed fruit each day.  o Take one (1) heaping tablespoon of a psyllium-based bulking agent twice a day. Use sugar-free sweetener when possible to avoid excessive calories.  o Eat a normal breakfast.  o Set aside 15 minutes after breakfast to sit on the toilet, but do not strain to have a bowel movement.  o If you do not have a bowel movement by the third day, use an enema and repeat the above steps.    Controlling diarrhea o Switch to liquids and  simpler foods for a few days to avoid stressing your intestines further. o Avoid dairy products (especially milk & ice cream) for a short time.  The intestines often can lose the ability to digest lactose when stressed. o Avoid foods that cause gassiness or bloating.  Typical foods include beans and other legumes, cabbage, broccoli, and dairy foods.  Every person has some sensitivity to other foods, so listen to our body and avoid those foods that trigger problems for you. o Adding fiber (Citrucel, Metamucil, psyllium, Miralax) gradually can help thicken stools by absorbing excess fluid and retrain the intestines to act more normally.  Slowly increase the dose over a few weeks.  Too much fiber too soon can backfire and cause cramping & bloating. o Probiotics (such as active yogurt, Align, etc) may help repopulate the intestines and colon with normal bacteria and calm down a sensitive digestive tract.  Most studies show it to be of mild help, though, and such products can be costly. o Medicines:   Bismuth subsalicylate (ex. Kayopectate, Pepto Bismol) every 30 minutes for up to 6 doses can help control diarrhea.  Avoid if pregnant.   Loperamide (Immodium) can slow down diarrhea.  Start with two tablets (4mg  total) first and then try one tablet every 6 hours.  Avoid if you are having fevers or severe pain.  If you are not better or start feeling worse, stop all medicines and call your doctor for advice o Call your doctor if you are getting worse or not better.  Sometimes further testing (cultures, endoscopy, X-ray studies, bloodwork, etc) may be needed to help diagnose and treat the cause of the diarrhea.

## 2013-04-13 NOTE — Progress Notes (Signed)
Subjective:     Patient ID: Carlos Choi, male   DOB: 1946/03/16, 67 y.o.   MRN: [id number]  HPI  Carlos Choi  1945/12/25 [id number]  Patient Care Team: Elvina Sidle, MD as PCP - General (Family Medicine)  This patient is a 67 y.o.male who presents today for surgical evaluation at the request of self.   Reason for visit: Recurrent hemorrhoid bleeding  Pleasant active male.  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  Began to get hemorrhoid problems.  Occasional bleeding.  Hard to keep the perianal skin clean after bowel movements.  Some itching and irritation.  Was noted to have a partially prolapsing hemorrhoid on initial visit with [place] in 2012.  It was banded.  Recurrent symptoms Mar2014.  It was injected.  He him comes with some bleeding.  Not severe but annoying.  He wondered if something needed to be done.  He is getting ready to go on a vacation cruise and worries that the hemorrhoids will get worse on that.  Wished to be seen to have enough time to recovering before his vacation.  He normally has a bowel movement every day to every other day.  He does not take a fiber supplement.  He recalls having a normal colonoscopy in 2007.  He cannot recall who was with but promises to bring those records back to [place].  No personal nor family history of GI/colon cancer, inflammatory bowel disease, irritable bowel syndrome, allergy such as Celiac Sprue, dietary/dairy problems, colitis, ulcers nor gastritis.  No recent sick contacts/gastroenteritis.  No travel outside the country.  No changes in diet.    Patient Active Problem List   Diagnosis Date Noted  . Elevated PSA 03/26/2012  . Hemorrhoids, internal, with bleeding 06/13/2011    Past Medical History  Diagnosis Date  . Hemorrhoids   . Rectal bleeding     due to hemorrhoid   . Hypertension   . Hypothyroid     Past Surgical History  Procedure Laterality Date  . Hernia repair      Over ten years ago per  patient. He was not sure of the date.  . Rotator cuff repair      both shoulders - patient does not remember the exact date    History   Social History  . Marital Status: Married    Spouse Name: N/A    Number of Children: N/A  . Years of Education: N/A   Occupational History  . Not on file.   Social History Main Topics  . Smoking status: Former Smoker    Quit date: 06/13/1971  . Smokeless tobacco: Never Used  . Alcohol Use: 1.2 oz/week    2 Cans of beer per week     Comment: Monthly.  . Drug Use: No  . Sexual Activity: Not on file   Other Topics Concern  . Not on file   Social History Narrative  . No narrative on file    Family History  Problem Relation Age of Onset  . Aneurysm Mother   . Alzheimer's disease Father   . Cancer Sister     ovarian  . Stroke Brother   . Hypertension Brother     Current Outpatient Prescriptions  Medication Sig Dispense Refill  . ALPRAZolam (XANAX) 0.25 MG tablet Take 1 tablet (0.25 mg total) by mouth 2 (two) times daily as needed for sleep.  20 tablet  0  . aspirin 81 MG tablet Take 81  mg by mouth every other day.        . hydrocortisone (ANUSOL-HC) 2.5 % rectal cream Place rectally 2 (two) times daily.  30 g  0  . lactobacillus acidophilus (BACID) TABS tablet Take 2 tablets by mouth 3 (three) times daily.      [name] levofloxacin (LEVAQUIN) 500 MG tablet Take 1 tablet (500 mg total) by mouth daily.  7 tablet  0  . levothyroxine (SYNTHROID, LEVOTHROID) 100 MCG tablet Take 1 tablet (100 mcg total) by mouth daily.  90 tablet  2  . metroNIDAZOLE (FLAGYL) 500 MG tablet Take 1 tablet (500 mg total) by mouth 3 (three) times daily.  21 tablet  0  . Multiple Vitamins-Minerals (CENTRUM SILVER PO) Take by mouth daily.        [name] NASONEX 50 MCG/ACT nasal spray USE AS DIRECTED  17 g  5  . Omega-3 Fatty Acids (FISH OIL) 1000 MG CAPS Take by mouth 3 (three) times daily.         No current facility-administered medications for this visit.     No Known  Allergies  BP 120/72  Pulse 84  Temp(Src) 98.4 F (36.9 C) (Temporal)  Resp 15  Ht 6\' 4"  (1.93 m)  Wt 195 lb 9.6 oz (88.724 kg)  BMI 23.82 kg/m2  No results found.   Review of Systems  Constitutional: Negative for fever, chills and diaphoresis.  HENT: Negative for ear discharge, facial swelling, mouth sores, nosebleeds, sore throat and trouble swallowing.   Eyes: Negative for photophobia, discharge and visual disturbance.  Respiratory: Negative for choking, chest tightness, shortness of breath and stridor.   Cardiovascular: Negative for chest pain and palpitations.  Gastrointestinal: Negative for nausea, vomiting, abdominal pain, diarrhea, constipation, blood in stool, abdominal distention, anal bleeding and rectal pain.  Endocrine: Negative for cold intolerance and heat intolerance.  Genitourinary: Negative for dysuria, urgency, difficulty urinating and testicular pain.  Musculoskeletal: Negative for arthralgias, back pain, gait problem and myalgias.  Skin: Negative for color change, pallor, rash and wound.  Allergic/Immunologic: Negative for environmental allergies and food allergies.  Neurological: Negative for dizziness, speech difficulty, weakness, numbness and headaches.  Hematological: Negative for adenopathy. Does not bruise/bleed easily.  Psychiatric/Behavioral: Negative for hallucinations, confusion and agitation.       Objective:   Physical Exam  Constitutional: He is oriented to person, place, and time. He appears well-developed and well-nourished. No distress.  HENT:  Head: Normocephalic.  Mouth/Throat: Oropharynx is clear and moist. No oropharyngeal exudate.  Eyes: Conjunctivae and EOM are normal. Pupils are equal, round, and reactive to light. No scleral icterus.  Neck: Normal range of motion. Neck supple. No tracheal deviation present.  Cardiovascular: Normal rate, regular rhythm and intact distal pulses.   Pulmonary/Chest: Effort normal and breath sounds  normal. No respiratory distress.  Abdominal: Soft. He exhibits no distension. There is no tenderness. Hernia confirmed negative in the right inguinal area and confirmed negative in the left inguinal area.  Genitourinary: Prostate normal. Rectal exam shows internal hemorrhoid. Rectal exam shows no fissure and anal tone normal. Prostate is not tender.  Exam done with assistance of male Medical Assistant in the room.  Perianal skin clean with good hygiene.  No pruritis.  No pilonidal disease.  No fissure.  No abscess/fistula.    No external skin tags / hemorrhoids of significance.  Tolerates digital and anoscopic rectal exam.  Normal sphincter tone.  No rectal masses.  Hemorrhoidal piles Right posterior enlarged and easily prolapses.  Right  anterior moderately enlarged.  Left lateral mildly enlarged   Musculoskeletal: Normal range of motion. He exhibits no tenderness.  Lymphadenopathy:    He has no cervical adenopathy.       Right: No inguinal adenopathy present.       Left: No inguinal adenopathy present.  Neurological: He is alert and oriented to person, place, and time. No cranial nerve deficit. He exhibits normal muscle tone. Coordination normal.  Skin: Skin is warm and dry. No rash noted. He is not diaphoretic. No erythema. No pallor.  Psychiatric: He has a normal mood and affect. His behavior is normal. Judgment and thought content normal.       Assessment:     Hemorrhoids with intermittent prolapse and bleeding.     Plan:     I discussed options with him.  I think it is reasonable to try banding one or two more times.  I strongly recommended he get on a fiber bowel regimen.  THD would be the backup plan.  I went ahead and banded his right posterior and anterior hemorrhoidal piles:  The anatomy & physiology of the anorectal region was discussed.  The pathophysiology of hemorrhoids and differential diagnosis was discussed.  Natural history progression  was discussed.   I stressed the  importance of a bowel regimen to have daily soft bowel movements to minimize progression of disease.     [name] symptoms are not adequately controlled.  Therefore, I recommended banding to treat the hemorrhoids.  I went over the technique, risks, benefits, and alternatives.   Goals of post-operative recovery were discussed as well.  Questions were answered.  The patient expressed understanding & wished to proceed.  The patient was positioned in the lateral decubitus position.  Perianal & rectal examination was done.  Using anoscopy, I ligated the hemorrhoids above the dentate line with banding.  The patient tolerated the procedure well.  Educational handouts further explaining the pathology, treatment options, and bowel regimen were given as well.

## 2013-04-13 NOTE — Addendum Note (Signed)
Addended by: Elvina Sidle on: 04/13/2013 08:19 AM   Modules accepted: Orders

## 2013-04-16 ENCOUNTER — Telehealth (INDEPENDENT_AMBULATORY_CARE_PROVIDER_SITE_OTHER): Payer: Self-pay

## 2013-04-16 NOTE — Telephone Encounter (Signed)
Pt wants Dr Michaell Cowing to know that Dr Ezzard Standing did a colonoscopy on him in 12/06/04 and he wanted that put into his records.

## 2013-04-16 NOTE — Telephone Encounter (Signed)
Message copied by Ethlyn Gallery on Fri Apr 16, 2013 11:39 AM ------      Message from: Zacarias Pontes      Created: Fri Apr 16, 2013 10:50 AM       Pt had colonscopy on December 06 2004,he called and wanted you to put in his records....done by Dr Ovidio Kin ------

## 2013-04-19 NOTE — Telephone Encounter (Signed)
Find his old chart & get that into the records, please

## 2013-04-28 ENCOUNTER — Ambulatory Visit (INDEPENDENT_AMBULATORY_CARE_PROVIDER_SITE_OTHER): Payer: Medicare Other | Admitting: Surgery

## 2013-05-27 DIAGNOSIS — Z0000 Encounter for general adult medical examination without abnormal findings: Secondary | ICD-10-CM | POA: Diagnosis not present

## 2013-05-27 DIAGNOSIS — E785 Hyperlipidemia, unspecified: Secondary | ICD-10-CM | POA: Diagnosis not present

## 2013-05-27 DIAGNOSIS — I10 Essential (primary) hypertension: Secondary | ICD-10-CM | POA: Diagnosis not present

## 2013-05-27 DIAGNOSIS — Z125 Encounter for screening for malignant neoplasm of prostate: Secondary | ICD-10-CM | POA: Diagnosis not present

## 2013-05-27 DIAGNOSIS — Z1159 Encounter for screening for other viral diseases: Secondary | ICD-10-CM | POA: Diagnosis not present

## 2013-05-27 DIAGNOSIS — K4090 Unilateral inguinal hernia, without obstruction or gangrene, not specified as recurrent: Secondary | ICD-10-CM | POA: Diagnosis not present

## 2013-05-31 ENCOUNTER — Other Ambulatory Visit: Payer: Self-pay | Admitting: Family Medicine

## 2013-05-31 DIAGNOSIS — Z87891 Personal history of nicotine dependence: Secondary | ICD-10-CM

## 2013-06-03 ENCOUNTER — Ambulatory Visit
Admission: RE | Admit: 2013-06-03 | Discharge: 2013-06-03 | Disposition: A | Discharge status: Home / Self Care | Payer: No Typology Code available for payment source | Source: Ambulatory Visit | Attending: Family Medicine | Admitting: Family Medicine

## 2013-06-03 DIAGNOSIS — Z87891 Personal history of nicotine dependence: Secondary | ICD-10-CM

## 2013-06-03 DIAGNOSIS — Z136 Encounter for screening for cardiovascular disorders: Secondary | ICD-10-CM | POA: Diagnosis not present

## 2013-06-06 DIAGNOSIS — J019 Acute sinusitis, unspecified: Secondary | ICD-10-CM | POA: Diagnosis not present

## 2013-06-07 NOTE — OR Nursing (Signed)
Requested [facility] note from Dr Clemetine Marker office.

## 2013-06-09 ENCOUNTER — Ambulatory Visit: Payer: Medicare Other

## 2013-06-11 MED ORDER — SODIUM CHLORIDE 0.9 % IV MBP
1000.0000 mg | INTRAVENOUS | Status: DC
Start: 2013-06-11 — End: 2013-06-11

## 2013-06-14 ENCOUNTER — Ambulatory Visit: Payer: Medicare Other | Attending: Orthopaedic Surgery

## 2013-06-14 DIAGNOSIS — Z01818 Encounter for other preprocedural examination: Secondary | ICD-10-CM | POA: Insufficient documentation

## 2013-06-14 DIAGNOSIS — M19019 Primary osteoarthritis, unspecified shoulder: Secondary | ICD-10-CM | POA: Insufficient documentation

## 2013-06-14 LAB — CBC AND DIFFERENTIAL
Basophils Absolute Automated: 0.01 (ref 0.00–0.20)
Basophils Automated: 0 %
Eosinophils Absolute Automated: 0.29 (ref 0.00–0.70)
Eosinophils Automated: 5 %
Hematocrit: 36.3 % — ABNORMAL LOW (ref 42.0–52.0)
Hgb: 12.2 g/dL — ABNORMAL LOW (ref 13.0–17.0)
Immature Granulocytes Absolute: 0.02
Immature Granulocytes: 0 %
Lymphocytes Absolute Automated: 1.7 (ref 0.50–4.40)
Lymphocytes Automated: 28 %
MCH: 31.8 pg (ref 28.0–32.0)
MCHC: 33.6 g/dL (ref 32.0–36.0)
MCV: 94.5 fL (ref 80.0–100.0)
MPV: 9.4 fL (ref 9.4–12.3)
Monocytes Absolute Automated: 0.5 (ref 0.00–1.20)
Monocytes: 8 %
Neutrophils Absolute: 3.56 (ref 1.80–8.10)
Neutrophils: 59 %
Nucleated RBC: 0 (ref 0–1)
Platelets: 223 (ref 140–400)
RBC: 3.84 — ABNORMAL LOW (ref 4.70–6.00)
RDW: 13 % (ref 12–15)
WBC: 6.06 (ref 3.50–10.80)

## 2013-06-14 LAB — URINALYSIS, REFLEX TO MICROSCOPIC EXAM IF INDICATED
Bilirubin, UA: NEGATIVE
Blood, UA: NEGATIVE
Glucose, UA: NEGATIVE
Ketones UA: NEGATIVE
Leukocyte Esterase, UA: NEGATIVE
Nitrite, UA: NEGATIVE
Protein, UR: 30 — AB
Specific Gravity UA: 1.024 (ref 1.001–1.035)
Urine pH: 5 (ref 5.0–8.0)
Urobilinogen, UA: NEGATIVE mg/dL

## 2013-06-14 LAB — COMPREHENSIVE METABOLIC PANEL
ALT: 17 U/L (ref 0–55)
AST (SGOT): 18 U/L (ref 5–34)
Albumin/Globulin Ratio: 1.3 (ref 0.9–2.2)
Albumin: 3.8 g/dL (ref 3.5–5.0)
Alkaline Phosphatase: 110 U/L (ref 40–150)
BUN: 23 mg/dL — ABNORMAL HIGH (ref 7–21)
Bilirubin, Total: 1.1 mg/dL (ref 0.2–1.2)
CO2: 25 mEq/L (ref 22–29)
Calcium: 9.6 mg/dL (ref 8.5–10.5)
Chloride: 107 mEq/L (ref 98–107)
Creatinine: 1.2 mg/dL (ref 0.7–1.3)
Globulin: 3 g/dL (ref 2.0–3.6)
Glucose: 90 mg/dL (ref 70–100)
Potassium: 3.8 mEq/L (ref 3.5–5.1)
Protein, Total: 6.8 g/dL (ref 6.0–8.3)
Sodium: 142 mEq/L (ref 136–145)

## 2013-06-14 LAB — GFR: EGFR: >60

## 2013-06-14 LAB — PT/INR
PT INR: 1 (ref 0.9–1.1)
PT: 13 (ref 12.6–15.0)

## 2013-06-14 LAB — APTT: PTT: 31 (ref 23–37)

## 2013-06-14 NOTE — Consults (Addendum)
CONSULTATION    Date Time:06/14/2013 1:11 PM  Patient Name: Gregory [name],Gregory [name]  Requesting Physician: Dr Nagda       Reason for Consultation:   Medical clearance for surgery.      History:   Gregory [name] is a 67 y.o. male who presents for preop evaluation. The patient is scheduled for an elective Left shoulder arthroplasty  for worsening joint pain. He feels well overall without any complaints. METS score>4.    Chief Complaint:   No chief complaint on file.      Problem List:     Patient Active Problem List   Diagnosis   . Ganglion cyst of wrist, right       Past Medical History:     Past Medical History   Diagnosis Date   . Asthma without status asthmaticus      childhood   . Arthritis      bilat shoulders, lt knee   . Fracture of unspecified bones      1974 lt shoulder       Past Surgical History:     Past Surgical History   Procedure Date   . Colonoscopy    . Excision, ganglion 12/19/2012     Procedure: EXCISION, GANGLION;  Surgeon: McHale, Kathleen A, MD;  Location: [facility];  Service: Orthopedics;  Laterality: Right;  injection right and left shoulder   . Injection, medication 12/19/2012     Procedure: INJECTION, MEDICATION;  Surgeon: McHale, Kathleen A, MD;  Location: [facility];  Service: Orthopedics;  Laterality: Bilateral;   . Hernia repair      lt inguinal age 11       Family History:   History reviewed. No pertinent family history.    Social History:     History     Social History   . Marital Status: Married     Spouse Name: N/A     Number of Children: N/A   . Years of Education: N/A     Occupational History   . Not on file.     Social History Main Topics   . Smoking status: Former Smoker     Quit date: 06/09/1989   . Smokeless tobacco: Never Used   . Alcohol Use: Yes      Comment: 1 drink/month   . Drug Use: No   . Sexually Active:      Other Topics Concern   . Not on file     Social History Narrative   . No narrative on file       Allergies:   No Known Allergies    Medications:     Prior to Admission  medications    Medication Sig Start Date End Date Taking? Authorizing Provider   Ibuprofen (MOTRIN IB PO) Take 600 mg by mouth as needed.   Yes [provider]       Review of Systems:   A comprehensive review of systems was obtained from chart review and the patient.    General ROS: negative for - malaise and fatigue  Psychological ROS: negative for - disorientation or suicidal ideation  Ophthalmic ROS: negative for - blurry vision  ENT ROS: negative for - headaches  Allergy and Immunology ROS: negative  Hematological and Lymphatic ROS: negative for - bleeding problems  Endocrine ROS: negative for - malaise/lethargy  Respiratory ROS: no cough, shortness of breath, or wheezing  Cardiovascular ROS: no chest pain or dyspnea on exertion  Gastrointestinal ROS: no abdominal   pain, change in bowel habits, or black or bloody stools  Genito-Urinary ROS: no dysuria, trouble voiding, or hematuria  Musculoskeletal ROS: positive for - joint pain  Neurological ROS: no TIA or stroke symptoms      Physical Exam:   There were no vitals filed for this visit.    General appearance - alert, well appearing, and in no distress  Mental status - alert, oriented to person, place, and time, affect appropriate to mood  Eyes - pupils equal and reactive, sclera anicteric. No pallor.  Ears - right ear normal, left ear normal  Nose - normal, no discharge  Mouth - mucous membranes moist,   Neck - supple, no significant adenopathy  Chest - clear to auscultation, no wheezes, rales or rhonchi, symmetric air entry  Heart - normal rate, regular rhythm, normal S1, S2, no murmurs, rubs, or gallops  Abdomen - soft, nontender, nondistended, no organomegaly  Neurological - alert, oriented, normal speech, no focal findings  Musculoskeletal - left shoulder replacement  OA changes with tenderness and painful ROM  Extremities - peripheral pulses normal, no pedal edema    Labs:     Results     ** No Results found for the last 24 hours. **          Rads:      Radiology Results (24 Hour)     ** No Results found for the last 24 hours. **        Labs reviewed   EKG: Normal.    Assessment/Plan:   Pre-operative Evaluation.  Osteoarthritis of  left shoulder .  No ASA/NSAID's, OTC Meds 10 days prior to surgery as per preop instructions.DVT and GI prophylaxis per orthopedics. Med Rec completed for post-op today and discussed with patient. Pt is medically cleared. Will follow patient post-operatively. Thank you for consultation.    Additional Recommendations:   CBC/BMP POD #1      Signed LR:JPVGK Marijean Heath, MD

## 2013-06-16 LAB — ECG 12-LEAD
Atrial Rate: 82 {beats}/min
P Axis: 61 degrees
P-R Interval: 144 ms
Q-T Interval: 376 ms
QRS Duration: 66 ms
QTC Calculation (Bezet): 439 ms
R Axis: -2 degrees
T Axis: 18 degrees
Ventricular Rate: 82 {beats}/min

## 2013-06-17 DIAGNOSIS — D126 Benign neoplasm of colon, unspecified: Secondary | ICD-10-CM

## 2013-06-17 HISTORY — DX: Benign neoplasm of colon, unspecified: D12.6

## 2013-06-18 ENCOUNTER — Inpatient Hospital Stay
Admission: RE | Admit: 2013-06-18 | Discharge: 2013-06-19 | DRG: 483 | Disposition: A | Discharge status: Home / Self Care | Payer: Medicare Other | Source: Ambulatory Visit | Attending: Orthopaedic Surgery | Admitting: Orthopaedic Surgery

## 2013-06-18 ENCOUNTER — Inpatient Hospital Stay: Payer: Medicare Other | Admitting: Orthopaedic Surgery

## 2013-06-18 ENCOUNTER — Encounter
Admission: RE | Disposition: A | Discharge status: Home / Self Care | Payer: Self-pay | Source: Ambulatory Visit | Attending: Orthopaedic Surgery

## 2013-06-18 ENCOUNTER — Inpatient Hospital Stay: Payer: Medicare Other | Admitting: Pain Medicine

## 2013-06-18 ENCOUNTER — Inpatient Hospital Stay: Payer: Medicare Other

## 2013-06-18 ENCOUNTER — Encounter: Payer: Self-pay | Admitting: Pain Medicine

## 2013-06-18 DIAGNOSIS — M19019 Primary osteoarthritis, unspecified shoulder: Principal | ICD-10-CM | POA: Diagnosis present

## 2013-06-18 DIAGNOSIS — J45909 Unspecified asthma, uncomplicated: Secondary | ICD-10-CM | POA: Diagnosis present

## 2013-06-18 DIAGNOSIS — Z87891 Personal history of nicotine dependence: Secondary | ICD-10-CM

## 2013-06-18 DIAGNOSIS — R030 Elevated blood-pressure reading, without diagnosis of hypertension: Secondary | ICD-10-CM | POA: Diagnosis present

## 2013-06-18 DIAGNOSIS — M6740 Ganglion, unspecified site: Secondary | ICD-10-CM | POA: Diagnosis present

## 2013-06-18 DIAGNOSIS — Y831 Surgical operation with implant of artificial internal device as the cause of abnormal reaction of the patient, or of later complication, without mention of misadventure at the time of the procedure: Secondary | ICD-10-CM | POA: Diagnosis not present

## 2013-06-18 DIAGNOSIS — T8182XA Emphysema (subcutaneous) resulting from a procedure, initial encounter: Secondary | ICD-10-CM | POA: Diagnosis not present

## 2013-06-18 DIAGNOSIS — R209 Unspecified disturbances of skin sensation: Secondary | ICD-10-CM | POA: Diagnosis present

## 2013-06-18 DIAGNOSIS — M778 Other enthesopathies, not elsewhere classified: Secondary | ICD-10-CM | POA: Diagnosis present

## 2013-06-18 HISTORY — PX: ARTHROPLASTY, SHOULDER, TOTAL: SHX3145

## 2013-06-18 HISTORY — DX: Other injury of unspecified body region, initial encounter: T14.8XXA

## 2013-06-18 LAB — TYPE AND SCREEN
AB Screen Gel: NEGATIVE
ABO Rh: O POS

## 2013-06-18 SURGERY — ARTHROPLASTY, SHOULDER, TOTAL, GLENOHUMERAL JOINT
Anesthesia: Anesthesia General | Site: Shoulder | Laterality: Left | Wound class: Clean

## 2013-06-18 MED ORDER — MEPERIDINE HCL 25 MG/ML IJ SOLN
12.5000 mg | Freq: Once | INTRAMUSCULAR | Status: DC | PRN
Start: 2013-06-18 — End: 2013-06-18

## 2013-06-18 MED ORDER — PROMETHAZINE HCL 25 MG/ML IJ SOLN
6.2500 mg | Freq: Four times a day (QID) | INTRAMUSCULAR | Status: DC | PRN
Start: 2013-06-18 — End: 2013-06-19
  Administered 2013-06-19: 6.25 mg via INTRAVENOUS
  Filled 2013-06-18: qty 1

## 2013-06-18 MED ORDER — PROPOFOL 10 MG/ML IV EMUL
INTRAVENOUS | Status: AC
Start: 2013-06-18 — End: ?
  Filled 2013-06-18: qty 20

## 2013-06-18 MED ORDER — SENNOSIDES-DOCUSATE SODIUM 8.6-50 MG PO TABS
1.0000 | ORAL_TABLET | Freq: Two times a day (BID) | ORAL | Status: DC
Start: 2013-06-18 — End: 2013-06-18
  Filled 2013-06-18: qty 1

## 2013-06-18 MED ORDER — DEXAMETHASONE SODIUM PHOSPHATE 4 MG/ML IJ SOLN (WRAP)
INTRAMUSCULAR | Status: DC | PRN
Start: 2013-06-18 — End: 2013-06-18
  Administered 2013-06-18: 10 mg via INTRAVENOUS

## 2013-06-18 MED ORDER — LIDOCAINE HCL 2 % IJ SOLN
INTRAMUSCULAR | Status: DC | PRN
Start: 2013-06-18 — End: 2013-06-18
  Administered 2013-06-18: 100 mg

## 2013-06-18 MED ORDER — BACITRACIN 50000 UNITS IM SOLR
INTRAMUSCULAR | Status: AC
Start: 2013-06-18 — End: ?
  Filled 2013-06-18: qty 50000

## 2013-06-18 MED ORDER — MAGNESIUM HYDROXIDE 400 MG/5ML PO SUSP
30.0000 mL | Freq: Four times a day (QID) | ORAL | Status: DC | PRN
Start: 2013-06-18 — End: 2013-06-19
  Filled 2013-06-18 (×4): qty 30

## 2013-06-18 MED ORDER — HYDROMORPHONE HCL PF 1 MG/ML IJ SOLN
0.5000 mg | INTRAMUSCULAR | Status: DC | PRN
Start: 2013-06-18 — End: 2013-06-18

## 2013-06-18 MED ORDER — MIDAZOLAM HCL 2 MG/2ML IJ SOLN
INTRAMUSCULAR | Status: DC | PRN
Start: 2013-06-18 — End: 2013-06-18
  Administered 2013-06-18: 2 mg via INTRAVENOUS

## 2013-06-18 MED ORDER — METOCLOPRAMIDE HCL 5 MG/ML IJ SOLN
INTRAMUSCULAR | Status: DC | PRN
Start: 2013-06-18 — End: 2013-06-18
  Administered 2013-06-18: 10 mg via INTRAVENOUS

## 2013-06-18 MED ORDER — PHENYLEPHRINE HCL 10 MG/ML IJ SOLN
INTRAMUSCULAR | Status: AC
Start: 2013-06-18 — End: ?
  Filled 2013-06-18: qty 1

## 2013-06-18 MED ORDER — ONDANSETRON HCL 4 MG/2ML IJ SOLN
INTRAMUSCULAR | Status: DC | PRN
Start: 2013-06-18 — End: 2013-06-18
  Administered 2013-06-18: 4 mg via INTRAVENOUS

## 2013-06-18 MED ORDER — METOPROLOL TARTRATE 1 MG/ML IV SOLN
INTRAVENOUS | Status: DC | PRN
Start: 2013-06-18 — End: 2013-06-18
  Administered 2013-06-18: 2.5 mg via INTRAVENOUS

## 2013-06-18 MED ORDER — GLYCOPYRROLATE 0.2 MG/ML IJ SOLN
INTRAMUSCULAR | Status: DC | PRN
Start: 2013-06-18 — End: 2013-06-18
  Administered 2013-06-18: 0.2 mg via INTRAVENOUS

## 2013-06-18 MED ORDER — BACITRACIN 50000 UNITS IM SOLR
INTRAMUSCULAR | Status: DC | PRN
Start: 2013-06-18 — End: 2013-06-18
  Administered 2013-06-18: 50000 [IU]

## 2013-06-18 MED ORDER — HYDROMORPHONE HCL PF 1 MG/ML IJ SOLN
INTRAMUSCULAR | Status: DC | PRN
Start: 2013-06-18 — End: 2013-06-18
  Administered 2013-06-18: 0.5 mg via INTRAVENOUS

## 2013-06-18 MED ORDER — NEOSTIGMINE METHYLSULFATE 1 MG/ML IJ SOLN
INTRAMUSCULAR | Status: AC
Start: 2013-06-18 — End: ?
  Filled 2013-06-18: qty 10

## 2013-06-18 MED ORDER — MIDAZOLAM HCL 2 MG/2ML IJ SOLN
INTRAMUSCULAR | Status: AC
Start: 2013-06-18 — End: ?
  Filled 2013-06-18: qty 2

## 2013-06-18 MED ORDER — HETASTARCH-ELECTROLYTES 6 % IV SOLN
INTRAVENOUS | Status: DC | PRN
Start: 2013-06-18 — End: 2013-06-18
  Administered 2013-06-18: 500 mL/h via INTRAVENOUS

## 2013-06-18 MED ORDER — ACETAMINOPHEN 325 MG PO TABS
650.0000 mg | ORAL_TABLET | ORAL | Status: DC | PRN
Start: 2013-06-18 — End: 2013-06-19

## 2013-06-18 MED ORDER — ROCURONIUM BROMIDE 50 MG/5ML IV SOLN
INTRAVENOUS | Status: AC
Start: 2013-06-18 — End: ?
  Filled 2013-06-18: qty 5

## 2013-06-18 MED ORDER — ONDANSETRON HCL 4 MG/2ML IJ SOLN
4.0000 mg | Freq: Once | INTRAMUSCULAR | Status: DC | PRN
Start: 2013-06-18 — End: 2013-06-18

## 2013-06-18 MED ORDER — BISACODYL 10 MG RE SUPP
10.0000 mg | Freq: Every day | RECTAL | Status: DC | PRN
Start: 2013-06-18 — End: 2013-06-19
  Filled 2013-06-18: qty 1

## 2013-06-18 MED ORDER — FENTANYL CITRATE 0.05 MG/ML IJ SOLN
INTRAMUSCULAR | Status: AC
Start: 2013-06-18 — End: ?
  Filled 2013-06-18: qty 5

## 2013-06-18 MED ORDER — ONDANSETRON HCL 4 MG/2ML IJ SOLN
INTRAMUSCULAR | Status: AC
Start: 2013-06-18 — End: ?
  Filled 2013-06-18: qty 2

## 2013-06-18 MED ORDER — SUCCINYLCHOLINE CHLORIDE 20 MG/ML IJ SOLN
INTRAMUSCULAR | Status: DC | PRN
Start: 2013-06-18 — End: 2013-06-18
  Administered 2013-06-18: 160 mg via INTRAVENOUS

## 2013-06-18 MED ORDER — CEFAZOLIN SODIUM 1 G IJ SOLR
INTRAMUSCULAR | Status: AC
Start: 2013-06-18 — End: ?
  Filled 2013-06-18: qty 1000

## 2013-06-18 MED ORDER — METOCLOPRAMIDE HCL 5 MG/ML IJ SOLN
INTRAMUSCULAR | Status: AC
Start: 2013-06-18 — End: ?
  Filled 2013-06-18: qty 2

## 2013-06-18 MED ORDER — DEXAMETHASONE SODIUM PHOSPHATE 10 MG/ML IJ SOLN
INTRAMUSCULAR | Status: AC
Start: 2013-06-18 — End: ?
  Filled 2013-06-18: qty 1

## 2013-06-18 MED ORDER — HYDROMORPHONE HCL PF 1 MG/ML IJ SOLN
INTRAMUSCULAR | Status: AC
Start: 2013-06-18 — End: ?
  Filled 2013-06-18: qty 1

## 2013-06-18 MED ORDER — SODIUM CHLORIDE 0.9 % IV SOLN
INTRAVENOUS | Status: AC
Start: 2013-06-18 — End: ?
  Filled 2013-06-18: qty 50

## 2013-06-18 MED ORDER — FENTANYL CITRATE 0.05 MG/ML IJ SOLN
INTRAMUSCULAR | Status: DC | PRN
Start: 2013-06-18 — End: 2013-06-18
  Administered 2013-06-18: 150 ug via INTRAVENOUS
  Administered 2013-06-18 (×2): 100 ug via INTRAVENOUS

## 2013-06-18 MED ORDER — HYDROMORPHONE HCL PF 1 MG/ML IJ SOLN
1.0000 mg | INTRAMUSCULAR | Status: DC | PRN
Start: 2013-06-18 — End: 2013-06-19

## 2013-06-18 MED ORDER — ASPIRIN 325 MG PO TBEC
325.0000 mg | DELAYED_RELEASE_TABLET | Freq: Two times a day (BID) | ORAL | Status: DC
Start: 2013-06-18 — End: 2013-06-19
  Administered 2013-06-18 – 2013-06-19 (×2): 325 mg via ORAL
  Filled 2013-06-18 (×2): qty 1

## 2013-06-18 MED ORDER — OXYCODONE-ACETAMINOPHEN 5-325 MG PO TABS
1.0000 | ORAL_TABLET | Freq: Once | ORAL | Status: DC | PRN
Start: 2013-06-18 — End: 2013-06-18

## 2013-06-18 MED ORDER — FAMOTIDINE 10 MG/ML IV SOLN (WRAP)
INTRAVENOUS | Status: DC | PRN
Start: 2013-06-18 — End: 2013-06-18
  Administered 2013-06-18: 20 mg via INTRAVENOUS

## 2013-06-18 MED ORDER — SENNOSIDES-DOCUSATE SODIUM 8.6-50 MG PO TABS
1.0000 | ORAL_TABLET | Freq: Two times a day (BID) | ORAL | Status: DC
Start: 2013-06-18 — End: 2013-06-19
  Administered 2013-06-18 – 2013-06-19 (×2): 1 via ORAL
  Filled 2013-06-18 (×2): qty 1

## 2013-06-18 MED ORDER — LIDOCAINE HCL (PF) 2 % IJ SOLN
INTRAMUSCULAR | Status: AC
Start: 2013-06-18 — End: ?
  Filled 2013-06-18: qty 5

## 2013-06-18 MED ORDER — ASPIRIN 325 MG PO TBEC
325.0000 mg | DELAYED_RELEASE_TABLET | Freq: Two times a day (BID) | ORAL | Status: DC
Start: 2013-06-18 — End: 2013-06-18
  Filled 2013-06-18: qty 1

## 2013-06-18 MED ORDER — SODIUM CHLORIDE 0.9 % IV MBP
1.0000 g | Freq: Three times a day (TID) | INTRAVENOUS | Status: AC
Start: 2013-06-18 — End: 2013-06-19
  Administered 2013-06-18 – 2013-06-19 (×2): 1 g via INTRAVENOUS
  Filled 2013-06-18 (×3): qty 1000

## 2013-06-18 MED ORDER — ROCURONIUM BROMIDE 50 MG/5ML IV SOLN
INTRAVENOUS | Status: DC | PRN
Start: 2013-06-18 — End: 2013-06-18
  Administered 2013-06-18: 40 mg via INTRAVENOUS
  Administered 2013-06-18 (×2): 10 mg via INTRAVENOUS

## 2013-06-18 MED ORDER — FAMOTIDINE 20 MG/2ML IV SOLN
INTRAVENOUS | Status: AC
Start: 2013-06-18 — End: ?
  Filled 2013-06-18: qty 2

## 2013-06-18 MED ORDER — ROPIVACAINE HCL 5 MG/ML IJ SOLN
INTRAMUSCULAR | Status: DC | PRN
Start: 2013-06-18 — End: 2013-06-19
  Administered 2013-06-18: 20 mL via EPIDURAL
  Administered 2013-06-18: 15 mL via EPIDURAL

## 2013-06-18 MED ORDER — FENTANYL CITRATE 0.05 MG/ML IJ SOLN
25.0000 ug | INTRAMUSCULAR | Status: DC | PRN
Start: 2013-06-18 — End: 2013-06-18

## 2013-06-18 MED ORDER — GLYCOPYRROLATE 0.2 MG/ML IJ SOLN
INTRAMUSCULAR | Status: AC
Start: 2013-06-18 — End: ?
  Filled 2013-06-18: qty 3

## 2013-06-18 MED ORDER — FENTANYL CITRATE 0.05 MG/ML IJ SOLN
INTRAMUSCULAR | Status: AC
Start: 2013-06-18 — End: ?
  Filled 2013-06-18: qty 2

## 2013-06-18 MED ORDER — OXYCODONE HCL ER 10 MG PO T12A
10.0000 mg | EXTENDED_RELEASE_TABLET | Freq: Two times a day (BID) | ORAL | Status: DC
Start: 2013-06-18 — End: 2013-06-19
  Administered 2013-06-18 – 2013-06-19 (×2): 10 mg via ORAL
  Filled 2013-06-18 (×2): qty 1

## 2013-06-18 MED ORDER — PHENYLEPHRINE 100 MCG/ML IV BOLUS (ANESTHESIA)
PREFILLED_SYRINGE | INTRAVENOUS | Status: DC | PRN
Start: 2013-06-18 — End: 2013-06-18
  Administered 2013-06-18 (×3): 100 ug via INTRAVENOUS
  Administered 2013-06-18 (×2): 200 ug via INTRAVENOUS

## 2013-06-18 MED ORDER — METOCLOPRAMIDE HCL 5 MG/ML IJ SOLN
10.0000 mg | Freq: Once | INTRAMUSCULAR | Status: DC | PRN
Start: 2013-06-18 — End: 2013-06-18

## 2013-06-18 MED ORDER — ROPIVACAINE HCL 5 MG/ML IJ SOLN
INTRAMUSCULAR | Status: AC
Start: 2013-06-18 — End: ?
  Filled 2013-06-18: qty 30

## 2013-06-18 MED ORDER — METOPROLOL TARTRATE 1 MG/ML IV SOLN
INTRAVENOUS | Status: AC
Start: 2013-06-18 — End: ?
  Filled 2013-06-18: qty 5

## 2013-06-18 MED ORDER — LACTATED RINGERS IV SOLN
INTRAVENOUS | Status: DC
Start: 2013-06-18 — End: 2013-06-19

## 2013-06-18 MED ORDER — NEOSTIGMINE METHYLSULFATE 1 MG/ML IJ SOLN
INTRAMUSCULAR | Status: DC | PRN
Start: 2013-06-18 — End: 2013-06-18
  Administered 2013-06-18: 1 mg via INTRAVENOUS

## 2013-06-18 MED ORDER — PROPOFOL INFUSION 10 MG/ML
INTRAVENOUS | Status: DC | PRN
Start: 2013-06-18 — End: 2013-06-18
  Administered 2013-06-18: 160 mg via INTRAVENOUS
  Administered 2013-06-18: 30 mg via INTRAVENOUS

## 2013-06-18 MED ORDER — SUCCINYLCHOLINE CHLORIDE 20 MG/ML IJ SOLN
INTRAMUSCULAR | Status: AC
Start: 2013-06-18 — End: ?
  Filled 2013-06-18: qty 10

## 2013-06-18 MED ORDER — ROPIVACAINE HCL 5 MG/ML IJ SOLN
6.0000 mL/h | INTRAMUSCULAR | Status: DC
Start: 2013-06-18 — End: 2013-06-19
  Administered 2013-06-18: 6 mL/h via PERCUTANEOUS
  Filled 2013-06-18: qty 60

## 2013-06-18 MED ORDER — OXYCODONE-ACETAMINOPHEN 5-325 MG PO TABS
2.0000 | ORAL_TABLET | ORAL | Status: DC
Start: 2013-06-18 — End: 2013-06-19
  Administered 2013-06-18 – 2013-06-19 (×2): 2 via ORAL
  Filled 2013-06-18 (×2): qty 2

## 2013-06-18 MED ORDER — SODIUM CHLORIDE 0.9 % IV MBP
1.0000 g | Freq: Once | INTRAVENOUS | Status: AC
Start: 2013-06-18 — End: 2013-06-18
  Administered 2013-06-18: 1 g via INTRAVENOUS

## 2013-06-18 MED ORDER — ACETAMINOPHEN 325 MG PO TABS
650.0000 mg | ORAL_TABLET | ORAL | Status: DC | PRN
Start: 2013-06-18 — End: 2013-06-18

## 2013-06-18 SURGICAL SUPPLY — 92 items
ADHESIVE SKIN SURGISEAL .35ML (Suture) ×2 IMPLANT
BIT DRILL OD2 MM TWIST (Drillbits) ×1 IMPLANT
BIT DRL 2MM 125MM STRL TWST (Drillbits) ×1
BLADE ELECTRODE EXTENDED 6IN (Cautery) ×2 IMPLANT
BLADE SAGITTAL 18X1.27X90MM (Blade) ×2 IMPLANT
BUR CARBIDE ROUND 4X55MM (Burr) IMPLANT
CEMENT BN TBR SMPX P STRL FD RADOPQ (Cement) ×1 IMPLANT
CEMENT BONE RADIOPAQUE FULL DOSE SIMPLEX (Cement) ×1 IMPLANT
CEMENT BONE RADIOPAQUE FULL DOSE SIMPLEX P TOBRAMYCIN (Cement) ×1 IMPLANT
COMPONENT GLENOID OD40 MM MEDIUM PEG (Component) ×1 IMPLANT
COMPONENT GLENOID OD40 MM MEDIUM PEG AEQUALIS PERFORM CORTILOC (Component) ×1 IMPLANT
COMPONENT GLND MED AQLS PRFRM CORTILOC (Component) ×1 IMPLANT
DRESSING TRANSPARENT L4 3/4 IN X W4 IN (Dressing) ×4
DRESSING TRANSPARENT L4 3/4 IN X W4 IN POLYURETHANE ADHESIVE (Dressing) ×4 IMPLANT
DRESSING TRNS PU STD TGDRM 4.75X4IN LF (Dressing) ×4
FIBERWIRE #2 BRAID BLUE W/NDLE (Suture)
GLOVE BIOGEL SURG PF SZ 8.5 (Glove) ×6 IMPLANT
GLOVE SURG BIOGEL INDIC SZ 8.5 (Glove) ×2 IMPLANT
GOWN SRG POLY 2XL ASTND LF STRL LVL 4 (Gown) ×1
GOWN SURGICAL 2XL POLY ASTOUND BLUE (Gown) ×1
GOWN SURGICAL 2XL POLY ASTOUND BLUE LEVEL 4 IMPERVIOUS REINFORCE SET (Gown) ×1 IMPLANT
GOWN X-LRG POLY REINFORCED (Gown) ×4 IMPLANT
HEAD HUM COCR 4MM HI OFST AQLS ASCND FLX (Head) ×1 IMPLANT
HEAD HUMERAL OD48 MM H18 MM 4 MM HIGH (Head) ×1 IMPLANT
HEAD HUMERAL OD48 MM H18 MM 4 MM HIGH OFFSET SHOULDER COCR AEQUALIS (Head) ×1 IMPLANT
HOLDER CATH VLCR UNV CATH-MATE LF ADJ (Procedure Accessories)
HOLDER CATHETER UNIVERSAL ADJUSTABLE (Procedure Accessories) IMPLANT
HOOD SRG FLYTE STRL PLWY LEN LVL 1 DISP (Procedure Accessories) ×1
HOOD SURGEON ZIPPER PEEL AWAY LENS FLYTE (Procedure Accessories) ×1 IMPLANT
HOOD T4 STERI-SHIELD (Personal Protection) ×4 IMPLANT
IMMOBILIZER ORTH POLY CTTN FM 8IN MED (Sling) ×1
IMMOBILIZER SHOULDER D8 IN MEDIUM L15 IN (Sling) ×1
IMMOBILIZER SHOULDER D8 IN MEDIUM L15 IN POLY COTTON FOAM ORTHOPEDIC (Sling) ×1 IMPLANT
INACTIVE USE LAWSON 112416 (Drape) ×2 IMPLANT
KIT DRAINAGE L12.5 IN ROUND 3 SPRING (Drain) ×1
KIT DRAINAGE L12.5 IN ROUND 3 SPRING EVACUATOR Y CONNECTOR TUBE HOLE (Drain) ×1 IMPLANT
KIT DRN PVC 400CC RND 1/8IN 12.5IN LF (Drain) ×1
KIT POSITIONING STABILIZATION SHOULDER (Kits) ×1 IMPLANT
KIT PSTN LF STAB SHLDR (Kits) ×1
MANIFOLD NEPTUNE II 4 PORT (Procedure Accessories) ×2 IMPLANT
MAT OR L46 IN X W40 IN MEDIUM ABSORBENT (Procedure Accessories) ×1
MAT OR L46 IN X W40IN MD ABSRBNT FLUID BARRIER BACK SURGISAFE BLUE (Procedure Accessories) ×1 IMPLANT
MAT OR SGRSF 46X40IN LF NS MED ABS FLD (Procedure Accessories) ×1
MIXER BONE CMNT MIXEVAC III (Ortho Supply) IMPLANT
NEEDLE REG BEVEL 20GX1.5IN (Needles) ×2 IMPLANT
NEEDLE SUT SS CATGUT .5 CRC RCHRD-ALLAN (Suture) ×1
NEEDLE SUTURE 1/2 CIRCLE L1.404 IN (Suture) ×1
NEEDLE SUTURE 1/2 CIRCLE L1.404 IN TROCAR POINT FREE EYE RICHARD-ALLAN (Suture) ×1 IMPLANT
PACK TOTAL HIP TRAY (Pack) ×2 IMPLANT
PAD ELECTROSRG GRND REM W CRD (Procedure Accessories) ×2 IMPLANT
RETRIEVER SUTURE ARTHRO STRL (Suture) ×1
RETRIEVER SUTURE STRAIGHT ISOMETRY (Suture) ×1
RETRIEVER SUTURE STRAIGHT ISOMETRY ROTATOR CUFF ISOTAC (Suture) ×1 IMPLANT
SEALER ELECTROSURGICAL L.226 IN 30 D (Cautery)
SEALER ELECTROSURGICAL L.226 IN 30 D .173 IN SPACE BIPOLAR 2 ELECTRODE (Cautery) IMPLANT
SEALER ESURG 30D .173IN SPC AQUAMANTYS (Cautery)
SLEEVE CMPR MED KN LGTH KDL SCD 21- IN (Sleeve) ×2
SLEEVE COMPRESSION MEDIUM KNEE LENGTH KENDALL SEQUENTIAL OD21- IN (Sleeve) ×1 IMPLANT
SOL NACL 0.9% IRR 3000CC (Irrigation Solutions) ×1
SOLUTION IRR 0.9% NACL 3L URMTC LF PLS (Irrigation Solutions) ×1
SOLUTION IRRIGATION 0.9% SODIUM CHLORIDE 3000 ML PLASTIC CONTAINER (Irrigation Solutions) ×1 IMPLANT
SOLUTION SRGPRP 74% ISPRP 0.7% IOD (Prep) ×2
SOLUTION SURGICAL PREP 26 ML DURAPREP (Prep) ×2
SOLUTION SURGICAL PREP 26 ML DURAPREP 74% ISOPROPYL ALCOHOL 0.7% (Prep) ×2 IMPLANT
STEM HUM PTC 127.5D 3A STD AQLS ASCND (Stem) ×2 IMPLANT
STEM HUMERAL L74 MM PRESSFIT AEQUALIS ASCEND FLEX PTC 127.5 D 3A (Stem) ×1 IMPLANT
STOCKINETTE IMPERVIOUS MD (Drape) ×2 IMPLANT
SURGEON SURGICAL VEST (Ortho Supply) ×2 IMPLANT
SUTURE ABS 3-0 SH VCL 27IN BRD COAT VIOL (Suture) ×1
SUTURE BLACK BLUE 2 L38 IN BRAID 2 (Suture) ×1
SUTURE BLACK BLUE WHITE 2 1/2 CIRCLE (Suture) ×1
SUTURE BLCK BLUE 2 L38 IN BRD 2 STRAND LOWER KNOT PROFILE NNBSRBBL (Suture) ×1 IMPLANT
SUTURE BLUE 2 T-5 L38 IN BRAID TIES (Suture)
SUTURE COATED VICRYL 3-0 SH L27 IN BRAID (Suture) ×1
SUTURE COATED VICRYL 3-0 SH L27 IN BRAID COATED VIOLET ABSORBABLE (Suture) ×1 IMPLANT
SUTURE ETHIBOND EXCEL GREEN 2 V-37 L30 (Suture) ×1
SUTURE ETHIBOND EXCEL GREEN 2 V-37 L30 IN BRAID 4 STRAND NONABSORBABLE (Suture) ×1 IMPLANT
SUTURE FIBERWIRE BLACK BLUE WHITE 2 1/2 CIRCLE STRAIGHT DIAMOND POINT (Suture) ×1 IMPLANT
SUTURE FIBERWIRE BLUE 2 T-5 L38 IN BRAID TIES MULTISTRAND LOWER KNOT (Suture) IMPLANT
SUTURE NABSB 2 .5 CRC STRG DMD PT FBRWR (Suture) ×1
SUTURE NABSB 2 FBRWR 38IN BRD 2 STRN LWR (Suture) ×1
SUTURE NABSB 2 V-37 EBND EXC 30IN BRD 4 (Suture) ×1
SUTURE VICRYL 2-0 SH 27IN (Suture) ×2 IMPLANT
SYRINGE LUER LOCK SAFETY 10CC (Syringes, Needles) ×4 IMPLANT
TIP SUCTN FRAZ 12F (Suction) ×2
TOWEL L26 IN X W17 IN COTTON PREWASH (Procedure Accessories) ×1
TOWEL L26 IN X W17 IN COTTON PREWASH DELINT BLUE ACTISORB SURGICAL (Procedure Accessories) ×1 IMPLANT
TOWEL SRG CTTN 26X17IN LF STRL PREWASH (Procedure Accessories) ×1
TRAY CATH FOLEY SILICONE 16FR (Catheter Urine) IMPLANT
TUBING SUCTION OD12 FR VENT OBTURATOR FLAKE RESISTANT SHAFT BARON (Suction) ×1 IMPLANT
WATER STERILE PVC FREE DEHP FREE 1000 ML (Solution) ×1 IMPLANT
WATER STRL 1000ML PIC LF PVC FR DEHP-FR (Solution) ×1

## 2013-06-18 NOTE — H&P (View-Only) (Signed)
CONSULTATION    Date Time:06/14/2013 1:11 PM  Patient Name: Gregory [name], Gregory [name]  Requesting Physician: Dr Melodie Bouillon       Reason for Consultation:   Medical clearance for surgery.      History:   Gregory [name] is a 68 y.o. male who presents for preop evaluation. The patient is scheduled for an elective Left shoulder arthroplasty  for worsening joint pain. He feels well overall without any complaints. METS score>4.    Chief Complaint:   No chief complaint on file.      Problem List:     Patient Active Problem List   Diagnosis   . Ganglion cyst of wrist, right       Past Medical History:     Past Medical History   Diagnosis Date   . Asthma without status asthmaticus      childhood   . Arthritis      bilat shoulders, lt knee   . Fracture of unspecified bones      1974 lt shoulder       Past Surgical History:     Past Surgical History   Procedure Date   . Colonoscopy    . Excision, ganglion 12/19/2012     Procedure: EXCISION, GANGLION;  Surgeon: Jillyn Hidden, MD;  Location: [facility];  Service: Orthopedics;  Laterality: Right;  injection right and left shoulder   . Injection, medication 12/19/2012     Procedure: INJECTION, MEDICATION;  Surgeon: Jillyn Hidden, MD;  Location: [facility];  Service: Orthopedics;  Laterality: Bilateral;   . Hernia repair      lt inguinal age 34       Family History:   History reviewed. No pertinent family history.    Social History:     History     Social History   . Marital Status: Married     Spouse Name: N/A     Number of Children: N/A   . Years of Education: N/A     Occupational History   . Not on file.     Social History Main Topics   . Smoking status: Former Smoker     Quit date: 06/09/1989   . Smokeless tobacco: Never Used   . Alcohol Use: Yes      Comment: 1 drink/month   . Drug Use: No   . Sexually Active:      Other Topics Concern   . Not on file     Social History Narrative   . No narrative on file       Allergies:   No Known Allergies    Medications:     Prior to Admission  medications    Medication Sig Start Date End Date Taking? Authorizing Provider   Ibuprofen (MOTRIN IB PO) Take 600 mg by mouth as needed.   Yes [provider]       Review of Systems:   A comprehensive review of systems was obtained from chart review and the patient.    General ROS: negative for - malaise and fatigue  Psychological ROS: negative for - disorientation or suicidal ideation  Ophthalmic ROS: negative for - blurry vision  ENT ROS: negative for - headaches  Allergy and Immunology ROS: negative  Hematological and Lymphatic ROS: negative for - bleeding problems  Endocrine ROS: negative for - malaise/lethargy  Respiratory ROS: no cough, shortness of breath, or wheezing  Cardiovascular ROS: no chest pain or dyspnea on exertion  Gastrointestinal ROS: no abdominal  pain, change in bowel habits, or black or bloody stools  Genito-Urinary ROS: no dysuria, trouble voiding, or hematuria  Musculoskeletal ROS: positive for - joint pain  Neurological ROS: no TIA or stroke symptoms      Physical Exam:   There were no vitals filed for this visit.    General appearance - alert, well appearing, and in no distress  Mental status - alert, oriented to person, place, and time, affect appropriate to mood  Eyes - pupils equal and reactive, sclera anicteric. No pallor.  Ears - right ear normal, left ear normal  Nose - normal, no discharge  Mouth - mucous membranes moist,   Neck - supple, no significant adenopathy  Chest - clear to auscultation, no wheezes, rales or rhonchi, symmetric air entry  Heart - normal rate, regular rhythm, normal S1, S2, no murmurs, rubs, or gallops  Abdomen - soft, nontender, nondistended, no organomegaly  Neurological - alert, oriented, normal speech, no focal findings  Musculoskeletal - left shoulder replacement  OA changes with tenderness and painful ROM  Extremities - peripheral pulses normal, no pedal edema    Labs:     Results     ** No Results found for the last 24 hours. **          Rads:      Radiology Results (24 Hour)     ** No Results found for the last 24 hours. **        Labs reviewed   EKG: Normal.    Assessment/Plan:   Pre-operative Evaluation.  Osteoarthritis of  left shoulder .  No ASA/NSAID's, OTC Meds 10 days prior to surgery as per preop instructions.DVT and GI prophylaxis per orthopedics. Med Rec completed for post-op today and discussed with patient. Pt is medically cleared. Will follow patient post-operatively. Thank you for consultation.    Additional Recommendations:   CBC/BMP POD #1      Signed WN:IOEVO Marijean Heath, MD

## 2013-06-18 NOTE — Plan of Care (Signed)
Met with patient and family.  Discharge plan is to home with no serv ices needed.   PT to address any DME needs. Agreeable with plan. Follow-up appt with Dr Cristy Folks on 06/30/13, with first appt 07/01/13 at 10 am at the [facility] in [place].  Jackelyn Poling BSN, RN-BC, CCM 1/2/201512:32 PM

## 2013-06-18 NOTE — Op Note (Signed)
Procedure Date: 06/18/2013     Patient Type: I     SURGEON: Anayansi Rundquist Primitivo Gauze MD  ASSISTANT:  Durwin Reges PA     PREOPERATIVE DIAGNOSIS:  Left shoulder arthritis.     POSTOPERATIVE DIAGNOSIS:  Left shoulder arthritis.     TITLE OF PROCEDURE:  Left total shoulder arthroplasty.     COMPLICATIONS:  None.     BLOOD LOSS:  175 mL.     DISPOSITION:  PACU, stable.     DRAINS:  Hemovac 1.     TITLE OF PROCEDURE:  Left total shoulder arthroplasty.     IMPLANTS:  1.  Include a size 3A Aequalis Ascend Flex stem from the [place] system.  2.  Size 48 x 18 high offset eccentric head from the same system.  3.  An Aequalis Perform model medium 40 pegged glenoid.     PREOPERATIVE INDICATIONS:  This is a very pleasant 67 year old male who presented to my office for  evaluation of his left shoulder.  He was having a lot of pain and stiffness  in the shoulder and also having some numbness and tingling radiating down  his arm.  He had some cervical issues, but overall was having pain deep  within the shoulder.  X-rays revealed advanced arthritic changes including  bone-on-bone arthritis as well as large osteophyte formation.  He had good  strength with rotator cuff testing but had pain deep in the shoulder.  He  tried conservative measures, tried physical therapy.  We discussed surgical  intervention in light of the fact that he had advanced arthritic changes.   He had medialized his glenoid as well, and given these findings, we  discussed surgical intervention.  I explained to him the risks and benefits  of the procedure, as well as the alternatives, the indications, and the  postoperative rehabilitation course.  He has understanding of everything  and he wanted to move forward with it.  We scheduled him accordingly for  June 18, 2013.     DESCRIPTION OF PROCEDURE:  I identified patient in the holding area, marked his left shoulder.  He was  given 2 grams of antibiotics, underwent general anesthesia, was placed in  beach chair  position and prepped and draped in the usual sterile fashion.   At this point, I made a 10-cm incision centered over the deltopectoral  groove.  We took down the cephalic vein, identified the vein, took it  medially.  We identified the subdeltoid adhesions and released them.  We  placed retractors and identified the axillary nerve and protected it  throughout the rest of the case.  I then coagulated the circumflex vessels  and tenotomized the biceps and identified the upper and lower border of the  subscapularis.  We then did an osteotomy of the lesser tuberosity, taking a  fragment that was about 1.4 cm medial, lateral, and about 2.2 cm superior  to inferior.  We took the capsular tissue off the humerus and identified  the osteophytes.  We cut our head in 30 degrees retroversion and removed  all the osteophytes in the capsular tissue off the humerus.  From here, we  then identified the upper and lower border of the subscapularis and  released them.  We released the anterior and posterior were able to get the  subscapularis and the anterior recess.  We then took the rest of the biceps  and the labrum off the glenoid.  The version was neutral, despite the  medialization.  We then drilled our central guide pin in and reamed over  the top of it.  We then drilled our central and then peripheral holes for  the pegged glenoid from the [place] system.  We then irrigated and cemented  the glenoid into place.  From here, I then placed the humerus back into  external rotation.  We checked our version and the cut was good.  It was  anatomic at about 30 degrees.  We then sounded and then reamed all the way  down to a size 3 stem.  This fit nicely.  We then trialed a size 48 x 18  eccentric high-offset head, which had good fixation and was well balanced,  although slightly loose.  I did not want to go up to a size 50 to get an  extra millimeter of thickness.  As a result, I kept with the 48.  We took  everything out, drilled 3  holes into the bicipital groove, and placed the  stem in and placed the head stitch around it.  We placed the head in the  high offset position at the 11 o'clock position.  From here, we then  balanced the head and relocated the shoulder.  We then irrigated copiously  and closed the subscapularis up in about 30 degrees external rotation.  We  then closed the rotator interval region and tenodesed the biceps to the  conjoined tendon and placed a Hemovac in, and closed him up with 2-0  Vicryl, 3-0 Vicryl, 4-0 Monocryl stitches, applied a sterile dressing and a  sling, and awoke him and took him to the recovery room in stable condition           D:  06/18/2013 16:04 PM by Dr. Wendi Snipes, MD ([id number])  T:  06/18/2013 17:13 PM by Karle Starch      (Conf: [id number]) (Doc ID: [id number])

## 2013-06-18 NOTE — Interval H&P Note (Signed)
The patient was re-examined, The H&P was unchanged. The surgical site was marked by the patient and me.

## 2013-06-18 NOTE — Anesthesia Preprocedure Evaluation (Signed)
Anesthesia Evaluation    AIRWAY    Mallampati: II    TM distance: >3 FB  Neck ROM: full  Mouth Opening:full   CARDIOVASCULAR    regular and normal       DENTAL         PULMONARY    clear to auscultation     OTHER FINDINGS                      Anesthesia Plan    ASA 2     general                     intravenous induction   Detailed anesthesia plan: general endotracheal and PNB        Post op pain management: per surgeon    informed consent obtained    Plan discussed with CRNA.

## 2013-06-18 NOTE — Progress Notes (Signed)
[facility] PROGRESS NOTE    Date Time: 06/18/2013 4:30 PM  Patient Name: Gregory [name], Gregory [name]        Subjective:   Pt seen in PACU, s/p  Left shoulder arthroplasty.     Medications:      Scheduled Meds: PRN Meds:         [COMPLETED] ceFAZolin 1 g Intravenous Once         Continuous Infusions:       . lactated ringers           fentaNYL 25 mcg Q5 Min PRN   HYDROmorphone 0.5 mg Q5 Min PRN   meperidine 12.5 mg Once PRN   metoclopramide 10 mg Once PRN   ondansetron 4 mg Once PRN   oxyCODONE-acetaminophen 1 tablet Once PRN   [DISCONTINUED] bacitracin  PRN         I personally reviewed all of the medications      Review of Systems:   A comprehensive review of systems was obtained from chart review  General ROS: negative  Ophthalmic ROS: negative  Endocrine ROS: negative  Respiratory ROS: no cough, shortness of breath, or wheezing  Cardiovascular ROS: no chest pain or dyspnea on exertion  Gastrointestinal ROS: no abdominal pain  Genito-Urinary ROS: no c/o currently  Musculoskeletal ROS: no c/o pain, currently. Interscalene block planned.  Neurological ROS: no TIA or stroke symptoms      Physical Exam:     Filed Vitals:    06/09/13 0818 06/18/13 1245 06/18/13 1620   BP:  128/83    Pulse:  96 107   Temp:  98.9 F (37.2 C) 98 F (36.7 C)   TempSrc:  Temporal Artery Temporal Artery   Resp:  18 20   Height: 1.676 m (_0 )     Weight: 77.111 kg (170 lb)     SpO2:  100% 95%         Intake and Output Summary (Last 24 hours) at Date Time    Intake/Output Summary (Last 24 hours) at 06/18/13 1630  Last data filed at 06/18/13 1615   Gross per 24 hour   Intake   1300 ml   Output    175 ml   Net   1125 ml       General appearance - awakening from anesthesia, well appearing, and in no distress  Mental status - oriented to person and place  Chest - clear to auscultation, no wheezes, rales or rhonchi, symmetric air entry  Heart - normal rate, regular rhythm, normal S1, S2, no murmurs  Abdomen - bowel sounds normal, abdomen  is soft, nontender, non distended  Neurological - alert, oriented, normal speech, no focal findings or movement disorder noted  Musculoskeletal - laying in bed  Extremities - pedal pulses normal, no pedal edema, no clubbing or cyanosis  LUE with sling, ppp, fingers warm  Consultant note reviewed.    Labs:       Lab 06/14/13 0758   GLU 90   BUN 23*   CREAT 1.2   CA 9.6   NA 142   K 3.8   CL 107   CO2 25   ALB 3.8   PHOS --   MG --   EGFR >60.0   PT 13.0   PTT 31   INR 1.0         Lab 06/14/13 0758   AST 18   ALT 17   ALKPHOS 110   ALB 3.8  BILITOTAL 1.1   AMY --   LIP --         Lab 06/14/13 0758   WBC 6.06   HGB 12.2*   HCT 36.3*   MCV 94.5   MCH 31.8   MCHC 33.6   RDW 13   MPV 9.4       No results found for this basename: TSH,FREET3,FREET4 in the last 168 hours        Rads:     Radiology Results (24 Hour)     ** No Results found for the last 24 hours. **                  Assessment and Plan:      Patient Active Problem List   Diagnosis   . Ganglion cyst of wrist, right   . Primary localized osteoarthrosis, shoulder region- s/p L TSR- continue plan per Dr. Melodie Bouillon   Post-operative pain- treat PRN per Ortho. Interscalene block planned  Currently with elevated BP, awakening from surgery -Monitor BP      Signed by: Synthia Innocent, NP

## 2013-06-18 NOTE — Anesthesia Procedure Notes (Signed)
Peripheral    Patient location during procedure: PACU  Start time: 06/18/2013 4:45 PM  End time: 06/18/2013 5:15 PM  Block Region: upper extremity  Block type: interscalene  Laterality: left  Injection technique: catheterReason for block: post-op pain management  Staffing  Anesthesiologist: Lajean Silvius  Performed by: Anesthesiologist Preanesthetic Checklist Completed: patient identified, surgical consent, pre-op evaluation, timeout performed, risks and benefits discussed, anesthesia consent given and correct site  Timeout Completed:  06/18/2013 4:45 PM  Peripheral Block  Patient monitoring: pulse oximetry, NIBP and nasal cannula O2  Patient position: sittingSterile Technique: ChloraPrepPremedication: yes  Procedures: ultrasound guided and nerve stimulator  Local infiltration: lidocaine 2%  Needle  Needle type: Tuohy     Needle gauge: 18 G        Catheter size: 18 G    Catheter at skin depth: 6 cm      Ultrasound Guided: LA spread visualized, Needle visualized and Relevant anatomy identified (nerve, vessels, muscle)    Assessment Incremental injection: yes      Injection Resistance: no  Paresthesia Pain: no    Blood Aspirated: No    no suspected intravascular injection  Block Outcome: patient tolerated procedure well, no complications and successful block

## 2013-06-18 NOTE — Transfer of Care (Signed)
Anesthesia Transfer of Care Note    Patient: Gregory [name]    Procedures performed: Procedure(s) with comments:  ARTHROPLASTY, SHOULDER, TOTAL    Anesthesia type: General ETT    Patient location: Phase I PACU    Last vitals:   Filed Vitals:    06/18/13 1620   BP:    Pulse: 107   Temp: 98 F (36.7 C)   Resp: 20   SpO2: 95%       Post pain: Patient not complaining of pain, continue current therapy     Mental Status: Sedated    Respiratory Function: tolerating nasal cannula    Cardiovascular: stable    Nausea/Vomiting: patient not complaining of nausea or vomiting    Hydration Status: adequate    Post assessment: no apparent anesthetic complications, no reportable events and no evidence of recall

## 2013-06-18 NOTE — Discharge Instructions (Addendum)
Dr. Clemetine Marker post-op instructions for shoulder replacement   DIET   * You may resume clear liquids and light foods after surgery (jello, soups, etc.)   * Progress to your normal diet as tolerated if you are not nauseated   MEDICATIONS   * Start taking the prescribed pain medication after returning home from the hospital. Take them only as often as indicated on the prescription.   * Common side effects of the pain medication are nausea, drowsiness, itching, and constipation. Take the medication with food to decrease these effects.   * If you have persistent nausea or vomiting, contact the office and we may be able to switch your medication or prescribe you an anti-nausea medication.   * If constipation occurs, we strongly recommend taking an over-the-counter laxative.   * Over the counter Benadryl can be used for itching.   * Do not drive a car or operate machinery while taking narcotic medication   * You will be asked by Dr. Melodie Bouillon to take a regular aspirin two times per day. This will aid in the prevention of blood clots. DO NOT TAKE ANY IBUPROFEN, ADVIL, OR ALEVE with this. You should alert Dr. Melodie Bouillon if you have a history or bleeding disorders, bleeding ulcers, blood clots, or intolerance to Aspirin.   * Do not take any Tylenol while on the narcotic medication as it already contains Tylenol.   WOUND CARE   * Your dressing will likely be removed on the second day after surgery. You will likely not have to change the dressing once you return home from the hospital.   * It is normal for the shoulder to bleed and swell following surgery. If a small amount of blood is noted then we may ask you to change the dressing daily until this stops.   * To shower, apply a large piece of saran wrap over the shoulder. Remove the wrap once the shower is complete.   * Do not submerge the incisions in a bath or pool until instructed by Dr. Melodie Bouillon.   * You may remove the sling to shower even if instructed to keep it on at all times.    ACTIVITY   * You will find that sleeping in a slightly upright position (i.e. reclining chair) with a pillow under the forearm will likely be the most comfortable position.   * Avoid long distance traveling and being seated or lying for long periods of time after surgery.   * NO driving until you have been seen back in the office.   * You may return to sedentary work ONLY or school 7-10 days after surgery, if your pain is tolerable.   * We encourage you to walk around and be active as soon as you feel comfortable. However, do not use the arm that has been operated on for any significant activity.   * You can use your hand for writing and typing, but no lifting or carrying with the arm that has undergone surgery.   * You should wear your sling at all times except for showering and performing exercises taught by the therapist.   * You may remove the sling and move your elbow and wrist 4-5 times per day.   * You must avoid rotating the arm outward beyond 30 degrees, and avoid actively rotating the arm inward until instructed that is it ok to do so by Dr. Melodie Bouillon.   COLD THERAPY (GAMEREADY)   * This is an optional device that  is not mandatory but will help with pain and swelling in the post-operative period. If not covered by your insurance, the cost is $250 to rent the machine. This is completely optional and is up to you if your insurance does not cover the cost. A representative from the company will contact you regarding this device.   * Begin using ice packs or the machine immediately after surgery. These will be given to you while in the hospital. Continue for 5-7 days once you return home.   * Use ice machine continuously or ice packs (if machine not covered by insurance) every hour for 20 minutes, (while awake) until your first post-operative visit. Do not place the ice packs directly in contact with skin. Frozen peas work well.   EXERCISE   * Perform gentle deep breathing exercises, (take a deep breath and  fully exhale) 5-10 times per hour during the first several days after surgery.   * Begin Physical Therapy after being evaluated by Dr. Melodie Bouillon in the office after surgery.   * You may come out of the sling 4-5 times per day to move the elbow and wrist.   * Once you start therapy, the therapist will show you more exercises to perform at home. These should be done 3-4 times per day.   THINGS TO LOOK OUT FOR   * Contact Dr. Melodie Bouillon or the on-call physician at [phone] at any time if you notice any of the following signs or symptoms:   * Painful swelling or numbness that persists beyond 24 hours   * Unrelenting pain that is not relieved by the medication   * Fever over 101.70F after the first 2 days following surgery. You may have a fever within the first 2 days of surgery. This may be a sign of inactivity. Make sure you are performing your deep breathing exercises.   * Redness around the incisions   * Color change or excessive swelling in your wrist or hand   * Continuous drainage or bleeding from the surgical wound that persists after the first 4-5 days after surgery (a small amount of drainage is expected)   * Difficulty breathing   * Excessive nausea/vomiting   * Swelling in either of your legs   * Anything else that concerns you   FOLLOW-UP CARE/QUESTIONS   * Dr. Melodie Bouillon will see you every day while you are in the hospital to see how you are doing and answer any questions or concerns.   * If you do not already have a post-operative appointment scheduled, please contact the office during normal office hours at [phone] and schedule an appointment for 10- 14 days after surgery.      CASE MANAGEMENT DISCHARGE SUMMARY    SLING TO LEFT UPPER EXTREMITY. DO NOT BEAR WEIGHT THROUGH OPERATIVE ARM.  DO NOT PERFORM EXERCISES TO SHOULDER UNTIL SEEN BY DR NAGDA IN HIS OFFICE.  ENTERIC COATED ASPIRIN 2X DAILY FOR BLOOD CLOT PREVENTION.  RETURN TO DR NAGDA'S OFFICE ON 1/214/15  AS PER HIS REQUEST.      [facility]  APPT :[place] [phone]) 07/01/13 @ 10AM.    CASE MANAGER: Jackelyn Poling RN, CCM [id number])     Medications      MEDICATION: ASPIRIN    Aspirin is useful for fever, pain, and inflammation. When taken in small doses every day, it also has the benefit of reducing the risk of heart attack and stroke. Buffered aspirin (brand names: Ascriptin, Ecotrin, and others)  contains an antacid to reduce stomach irritation. Enteric-coated aspirin has a protective film that prevents it from dissolving until it passes out of the stomach. Both types of aspirin reduce the risk of a bleeding ulcer.  DIRECTIONS FOR USE:  Aspirin should be taken with a full glass of water, or with food or milk to reduce stomach upset.  Children and teenagers should not take aspirin while sick with chickenpox or any viral or flu-like illness since it may cause liver damage; use acetaminophen (Tylenol) instead.  WHAT TO WATCH FOR:  POSSIBLE SIDE EFFECTS: Nausea, upper abdominal pain (Take this medicine with food. Contact your doctor if these symptoms persist or become severe). Bleeding from the stomach [may appear as blood in vomit or stool (red or black color)], easy bruising, drowsiness, or repeated vomiting (Contact your doctor or return to this facility promptly).  ALLERGIC REACTIONS: Rash, itching, swelling, trouble breathing or swallowing (Contact your doctor or return to this facility promptly).  MEDICAL CONDITIONS: Before starting this medicine, be sure your doctor knows if you have any of the following conditions:   Stomach ulcer (active or in the past), history of vomiting blood or bloody stool   Sensitivity to aspirin or anti-inflammatory medicines (ibuprofen, Motrin, Naprosyn, etc.)   Asthma, nasal polyps, angioedema, liver or kidney disease; bleeding disorder, pregnancy or breastfeeding  DRUG INTERACTIONS: Before starting this medicine, be sure your doctor knows if you are taking any of the following drugs:   Coumadin  (warfarin), diabetes pills, prednisone, other anti-inflammatory drugs (ibuprofen, Motrin, Naprosyn, etc.), Axid, methotrexate, probenecid, Depakene (valproic acid)   ACE inhibitors [Lotensin (benazepril), Capoten (captopril), Vasotec (enalapril), and others]   Beta-blockers [Inderal (propranolol), Tenormin (atenolol), Lopressor (metoprolol), and others]  WARNINGS:   Daily aspirin use in medium or high doses carries the risk of causing a bleeding ulcer. Buffered or enteric-coated aspirin helps prevent this problem.   Do not take this medicine with prednisone, other anti-inflammatory drugs, or alcohol since this increases the risk of a bleeding ulcer.      MEDICATION: OXYCODONE, sustained release    Oxycodone (brand names: Oxycontin, Percolone, Roxicodone) is a sustained release narcotic pain medicine. Be sure to take it only as directed.  DIRECTIONS FOR USE:  Take with a full glass of water. If this medicine makes your stomach upset, take it with food. Pain medicine should be taken only if needed at the times prescribed. If you are not having pain, do not take the medicine unless you are advised to do so by your doctor. Do not crush, chew or break the sustained release tablets.  WHAT TO WATCH FOR  POSSIBLE SIDE EFFECTS: Dizziness, drowsiness (Reduce dose or take it less often). Constipation (Drink lots of liquids, use small doses of a mild laxative like Milk of Magnesia as needed). Nausea, vomiting (Take the medicine with food). Difficulty passing urine (Stop the medicine and contact your doctor). Slowed breathing (less than 8 breaths per minute), loss of consciousness (Call 911 or go to the Emergency Department).  ALLERGIC REACTION: Rash, itching, swelling, trouble breathing or swallowing (Contact your doctor or return to this facility promptly).  MEDICAL CONDITIONS: Before starting this medicine, be sure your doctor knows if you have any of the following conditions:  -- Chronic alcoholism or history of drug  abuse  -- Kidney or liver disease, asthma, prostate enlargement or trouble passing urine, hypothyroid, seizures, gallbladder disease  -- Pregnancy or breast feeding  DRUG INTERACTIONS: Before starting this medicine, be sure your doctor knows  if you are taking any of the following medicines:  -- Medicine for sleep, anxiety  -- Tricyclic antidepressants [Elavil (amitriptyline) and others], narcotic pain medicines  -- Phenothiazines [Thorazine (chlorpromazine), and others], antihistamines  WARNINGS:  -- DO NOT DRIVE, ride a bicycle or operate dangerous equipment while taking this medicine until you know how this drug will affect you.  -- This drug may cause increased side effects when taken with alcohol, muscle relaxant, sedative, anxiety medicine, antihistamines, antidepressants, MAO-inhibitor, seizure medicine, or another pain medicine.  -- Prolonged use of this medicine can be HABIT FORMING and may lead to ADDICTION.        Medication: Phenergan    Phenergan (generic: promethazine) is used to treat nausea and vomiting. It comes in oral and suppository form.  Directions For Use:  TABLETS, CAPSULES AND LIQUID: This medicine may be taken with or without water, juices, colas, milk and soft foods. Do not take within 2 hours of antacids.  SUPPOSITORIES: Remove the foil and insert the narrow end one inch into the rectum. If you were told to use only half of a suppository, cut it length-wise with a knife before insertion. Store suppositories in refrigerator.  What To Watch For:  Possible Side Effects: Dizziness, dry mouth, drowsiness . Contact your doctor if these symptoms do not go away after 24 hours. Difficulty passing urine. Contact your doctor or return to this facility if your bladder feels full and you have been unable to pass urine for more than 8 hours. Muscle spasm of the face, mouth, tongue, jaw or neck muscles. Return to this facility at once.  Allergic Reaction: Rash, itching, swelling, trouble breathing or  swallowing. Contact your doctor or return to this facility promptly.    Important  Medical Conditions: Before starting this medicine, be sure your doctor knows if you have any of the following conditions:  -- Pregnancy or breast feeding, sleep apnea  -- Glaucoma, prostate enlargement, difficulty passing urine  -- Seizure disorder, asthma or lung disease  Drug Interactions: Before starting this medicine, be sure your doctor knows if you are taking any of the following drugs:  -- MAO Inhibitors [Nardil (phenelzine), Parnate (tranylcypromine)]  -- Tricyclic Antidepressants [Amitriptyline (Elavil), Doxepin (Sinequan), Imipramine (Tofranil) and others]  -- Phenothiazines (Thorazine, Haldol, Mellaril, Compazine and others)  -- Antihistamines (Benadryl, Vistaril, Atarax and others); Bentyl, Levsin, Donnatol, Librax, ProBanthine  -- Cystospaz, Cogentin, Artane, Combivent or Atrovent inhalers, Levodopa, Lithium  Warnings:   -- DO NOT DRIVE, ride a bicycle or operate dangerous equipment while taking this medicine until you know how it will affect you.  -- May cause excess drowsiness when taken with alcohol, muscle relaxant, sedative or pain medicine. Use with caution.      MEDICATION: DOCUSATE    Docusate (brand names include Colace, Correctol, DSS and others) is used to treat constipation. It usually gives relief in 1-3 days.  DIRECTIONS FOR USE:  -- Take this medicine at bedtime. Capsules can be taken with an 8 ounce glass of water or juice. The liquid form can be added to 4-8 ounces of juice or milk or formula.  -- Decrease the dose or stop the medicine if you get diarrhea.   -- Use this medicine only when needed. Do not use for more than 1 week unless told otherwise.   WHAT TO WATCH FOR:  POSSIBLE SIDE EFFECTS: Stomach pain, diarrhea or cramping, throat irritation from the liquid form --> (This should go away within a few hours after a dose.  If symptoms persist, notify your doctor).   ALLERGIC REACTIONS: Rash, itching,  swelling, trouble swallowing or breathing --> (Contact your doctor or return to this facility promptly).  ---------- IMPORTANT ----------  MEDICAL CONDITIONS: Before starting this medicine, be sure your doctor knows if you have any of the following conditions:  -- Severe abdominal pain or vomiting  -- Pregnancy or breast feeding  DRUG INTERACTIONS: Before starting this medicine, be sure your doctor knows if you are taking any of the following drugs:  -- Mineral Oil, aspirin  -- Laxatives containing phenolphthalein (such as Ex-Lax)  WARNINGS:  -- Notify your doctor if your condition worsens or fails to respond to this medicine.  [NOTE: This information topic may not include all directions, precautions, medical conditions, drug/food interactions and warnings for this drug. Check with your doctor, nurse or pharmacist for any questions that you may have.]   [address], [address] [place] [address]. All rights reserved. This information is not intended as a substitute for professional medical care. Always follow your healthcare professional's instructions.

## 2013-06-19 DIAGNOSIS — J45909 Unspecified asthma, uncomplicated: Secondary | ICD-10-CM | POA: Insufficient documentation

## 2013-06-19 DIAGNOSIS — M19019 Primary osteoarthritis, unspecified shoulder: Principal | ICD-10-CM

## 2013-06-19 LAB — CBC AND DIFFERENTIAL
Basophils Absolute Automated: 0 10*3/uL (ref 0.00–0.20)
Basophils Automated: 0 %
Eosinophils Absolute Automated: 0 10*3/uL (ref 0.00–0.70)
Eosinophils Automated: 0 %
Hematocrit: 31.5 % — ABNORMAL LOW (ref 42.0–52.0)
Hgb: 10.7 g/dL — ABNORMAL LOW (ref 13.0–17.0)
Immature Granulocytes Absolute: 0.02 10*3/uL
Immature Granulocytes: 0 %
Lymphocytes Absolute Automated: 0.62 10*3/uL (ref 0.50–4.40)
Lymphocytes Automated: 6 %
MCH: 31.6 pg (ref 28.0–32.0)
MCHC: 34 g/dL (ref 32.0–36.0)
MCV: 92.9 fL (ref 80.0–100.0)
MPV: 9.2 fL — ABNORMAL LOW (ref 9.4–12.3)
Monocytes Absolute Automated: 0.3 10*3/uL (ref 0.00–1.20)
Monocytes: 3 %
Neutrophils Absolute: 8.95 10*3/uL — ABNORMAL HIGH (ref 1.80–8.10)
Neutrophils: 91 %
Nucleated RBC: 0 (ref 0–1)
Platelets: 194 10*3/uL (ref 140–400)
RBC: 3.39 10*6/uL — ABNORMAL LOW (ref 4.70–6.00)
RDW: 13 % (ref 12–15)
WBC: 9.87 10*3/uL (ref 3.50–10.80)

## 2013-06-19 LAB — BASIC METABOLIC PANEL
BUN: 18 mg/dL (ref 7–21)
CO2: 24 mEq/L (ref 22–29)
Calcium: 8.6 mg/dL (ref 8.5–10.5)
Chloride: 107 mEq/L (ref 98–107)
Creatinine: 1 mg/dL (ref 0.7–1.3)
Glucose: 154 mg/dL — ABNORMAL HIGH (ref 70–100)
Potassium: 4.5 mEq/L (ref 3.5–5.1)
Sodium: 136 mEq/L (ref 136–145)

## 2013-06-19 LAB — GFR: EGFR: >60

## 2013-06-19 LAB — CELL MORPHOLOGY: Cell Morphology: NORMAL

## 2013-06-19 MED ORDER — ASPIRIN 325 MG PO TBEC
325.0000 mg | DELAYED_RELEASE_TABLET | Freq: Two times a day (BID) | ORAL | Status: DC
Start: 2013-06-19 — End: 2014-07-29

## 2013-06-19 MED ORDER — SENNOSIDES-DOCUSATE SODIUM 8.6-50 MG PO TABS
1.0000 | ORAL_TABLET | Freq: Two times a day (BID) | ORAL | Status: DC
Start: 2013-06-19 — End: 2014-03-18

## 2013-06-19 MED ORDER — PROMETHAZINE HCL 12.5 MG PO TABS
12.5000 mg | ORAL_TABLET | Freq: Four times a day (QID) | ORAL | Status: DC | PRN
Start: 2013-06-19 — End: 2014-03-18

## 2013-06-19 MED ORDER — OXYCODONE HCL ER 10 MG PO T12A
10.0000 mg | EXTENDED_RELEASE_TABLET | Freq: Two times a day (BID) | ORAL | Status: DC
Start: 2013-06-19 — End: 2014-03-18

## 2013-06-19 NOTE — Progress Notes (Signed)
[facility] INTERNAL MEDICINE PROGRESS NOTE    Date Time: 06/19/2013 8:04 AM  Patient Name: AVRAJ, LINDROTH T        Subjective:   Been feeling nauseous all night long.  Didn't sleep much.    Medications:   Scheduled Meds:  Current Facility-Administered Medications   Medication Dose Route Frequency   . aspirin EC  325 mg Oral Q12H [place]   . [COMPLETED] ceFAZolin  1 g Intravenous Once   . [COMPLETED] ceFAZolin  1 g Intravenous Q8H [place]   . oxyCODONE  10 mg Oral Q12H [place]   . oxyCODONE-acetaminophen  2 tablet Oral Q4H [place]   . senna-docusate  1 tablet Oral BID   . [DISCONTINUED] aspirin EC  325 mg Oral Q12H [place]   . [DISCONTINUED] senna-docusate  1 tablet Oral BID     Continuous Infusions:     . lactated ringers     . ropivacaine 6 mL/hr (06/18/13 1731)     PRN Meds:.acetaminophen, bisacodyl, HYDROmorphone, magnesium hydroxide, promethazine, [DISCONTINUED] acetaminophen, [DISCONTINUED] bacitracin, [DISCONTINUED] fentaNYL, [DISCONTINUED] HYDROmorphone, [DISCONTINUED] meperidine, [DISCONTINUED] metoclopramide, [DISCONTINUED] ondansetron, [DISCONTINUED] oxyCODONE-acetaminophen      Review of Systems:   A comprehensive review of systems was: History obtained from chart review and the patient  General ROS: negative  Ophthalmic ROS: negative  Endocrine ROS: negative  Respiratory ROS: no cough, shortness of breath, or wheezing  Cardiovascular ROS: no chest pain or dyspnea on exertion  Gastrointestinal ROS: no abdominal pain, change in bowel habits, N/V/D.  Tolerating meals/fluids, passing gas.  Genito-Urinary ROS: no c/o dysuria, trouble voiding, or hematuria  Musculoskeletal ROS: left shoulder pain.  Still on epidural  Neurological ROS: no TIA or stroke symptoms, denies dizziness      Physical Exam:     Filed Vitals:    06/18/13 2200 06/18/13 2300 06/18/13 2357 06/19/13 0501   BP: 139/80 138/77 125/76 118/75   Pulse: 95 111 109 97   Temp: 98.7 F (37.1 C)  98.3 F (36.8 C) 98.1 F (36.7 C)   TempSrc: Oral  Oral Oral   Resp: _0 Height:       Weight:       SpO2: 98% 98% 98% 97%         Intake and Output Summary (Last 24 hours) at Date Time    Intake/Output Summary (Last 24 hours) at 06/19/13 0804  Last data filed at 06/19/13 0414   Gross per 24 hour   Intake   2100 ml   Output   1050 ml   Net   1050 ml       General appearance - alert, well appearing, and in no distress, though witnessed emisses.  Pt reports feeling better after vomiting.  Mental status - alert, oriented to person, place, and time  Chest - clear to auscultation, no wheezes, rales or rhonchi, symmetric air entry  Heart - normal rate, regular rhythm, normal S1, S2, no murmurs, rubs, clicks or gallops  Abdomen - bowel sounds normal, Abdomen is soft, nontender, nondistended  Neurological - alert, oriented, normal speech, no focal findings or movement disorder noted  Musculoskeletal - left shoulder bandages intact.  Left arm in sling.  Wound drain still in place.    Extremities - peripheral pulses normal, no pedal edema, no clubbing or cyanosis.  SCDs in place    Consultant note reviewed.    Labs:     Results     Procedure Component Value Units Date/Time  Cell MorpHology [[id number]]  (Abnormal) Collected:06/19/13 0442     Cell Morphology: Normal Updated:06/19/13 0737     Toxic Vacuolation =1+ (A)     CBC with differential [[id number]]  (Abnormal) Collected:06/19/13 0442    Specimen Information:Blood / Blood Updated:06/19/13 0736     WBC 9.87 x10 3/uL      RBC 3.39 (L) x10 6/uL      Hgb 10.7 (L) g/dL      Hematocrit 31.5 (L) %      MCV 92.9 fL      MCH 31.6 pg      MCHC 34.0 g/dL      RDW 13 %      Platelets 194 x10 3/uL      MPV 9.2 (L) fL      Neutrophils 91 %      Lymphocytes Automated 6 %      Monocytes 3 %      Eosinophils Automated 0 %      Basophils Automated 0 %      Immature Granulocyte 0 %      Nucleated RBC 0      Neutrophils Absolute 8.95 (H) x10 3/uL      Abs Lymph Automated 0.62 x10 3/uL      Abs Mono Automated 0.30 x10 3/uL      Abs Eos Automated 0.00 x10  3/uL      Absolute Baso Automated 0.00 x10 3/uL      Absolute Immature Granulocyte 0.02 x10 3/uL     Basic Metabolic Panel [[id number]]  (Abnormal) Collected:06/19/13 0442    Specimen Information:Blood Updated:06/19/13 0519     Glucose 154 (H) mg/dL      BUN 18 mg/dL      Creatinine 1.0 mg/dL      CALCIUM 8.6 mg/dL      Sodium 136 mEq/L      Potassium 4.5 mEq/L      Chloride 107 mEq/L      CO2 24 mEq/L     GFR [[id number]] Collected:06/19/13 0442     EGFR >60.0 Updated:06/19/13 0519    Type and screen [[id number]] Collected:06/18/13 1244    Specimen Information:Blood Updated:06/18/13 1342     ABO Rh O POS      AB Screen Gel NEG           No results found for this basename: GLUCOSEWHOLE:10 in the last 168 hours          Rads:     Radiology Results (24 Hour)     Procedure Component Value Units Date/Time    XR Shoulder Left 1 View [[id number]] Collected:06/18/13 2003    Order Status:Completed  Updated:06/18/13 2007    Narrative:    CLINICAL INDICATION: Postsurgical status post arthroplasty    COMPARISON: None    FINDINGS: Portable AP neutral view. Status post     joint arthroplasty  with anatomic alignment. No fracture or dislocation. Post surgical  changes with subcutaneous emphysema, skin staples and drainage catheter.      Impression:     Status post arthroplasty with anatomical alignment.    Odette Fraction, MD   06/18/2013 8:03 PM                  Assessment and Plan:      Patient Active Problem List   Diagnosis   . Ganglion cyst of wrist, right   . Primary localized osteoarthrosis, shoulder region - s/p L TSR - continue plan per orhto.   [name]  Post-operative pain- treat PRN per Ortho.  Discussed bowel regimen to avoid constipation due to pain mediations such as increased fluid, fiber, over the counter stool softener as needed.     . Asthma without status asthmaticus - currently without complaints     Post op nausea - patient with nausea through the night, vomited this am after breakfast.  IV phenergan administered by nurse.   Pt given phenergan script for discharge.  Instructed to take one hour prior to pain medication as needed.    Signed by: Marcell Barlow, NP

## 2013-06-19 NOTE — Anesthesia Postprocedure Evaluation (Signed)
Anesthesia Post Evaluation    Patient: Gregory [name]    Procedures performed: Procedure(s) with comments:  ARTHROPLASTY, SHOULDER, TOTAL    Anesthesia type: General ETT    Patient location:[facility] Floor    Last vitals:   Filed Vitals:    06/19/13 0501   BP: 118/75   Pulse: 97   Temp: 98.1 F (36.7 C)   Resp: 18   SpO2: 97%       Post pain: Patient not complaining of pain, continue current therapy      Mental Status:awake and alert     Respiratory Function: tolerating room air    Cardiovascular: stable    Nausea/Vomiting: patient not complaining of nausea or vomiting    Hydration Status: adequate    Post assessment: no apparent anesthetic complications, no reportable events and no evidence of recall  ISB catheter pulled tip intact, good analgesia, moving arm

## 2013-06-19 NOTE — Progress Notes (Signed)
PROGRESS NOTE    Date Time: 06/19/2013 9:55 AM  Patient Name: Gregory [name],Gregory [name]      Assessment:   S/p L shoulder replacement.  Doing well.      Plan:   [place] home if cleared by IM and OT.  Stay in sling.  OK to come out and do exercises shown by OT.     Subjective:   I have no pain.      Medications:     Current Facility-Administered Medications   Medication Dose Route Frequency   . aspirin EC  325 mg Oral Q12H [place]   . [COMPLETED] ceFAZolin  1 g Intravenous Once   . [COMPLETED] ceFAZolin  1 g Intravenous Q8H [place]   . oxyCODONE  10 mg Oral Q12H [place]   . oxyCODONE-acetaminophen  2 tablet Oral Q4H [place]   . senna-docusate  1 tablet Oral BID   . [DISCONTINUED] aspirin EC  325 mg Oral Q12H [place]   . [DISCONTINUED] senna-docusate  1 tablet Oral BID       Physical Exam:     Filed Vitals:    06/19/13 0501   BP: 118/75   Pulse: 97   Temp: 98.1 F (36.7 C)   Resp: 18   SpO2: 97%       Intake and Output Summary (Last 24 hours) at Date Time    Intake/Output Summary (Last 24 hours) at 06/19/13 0955  Last data filed at 06/19/13 0414   Gross per 24 hour   Intake   2100 ml   Output   1050 ml   Net   1050 ml         Labs:     Results     Procedure Component Value Units Date/Time    Cell MorpHology [[id number]]  (Abnormal) Collected:06/19/13 0442     Cell Morphology: Normal Updated:06/19/13 0737     Toxic Vacuolation =1+ (A)     CBC with differential [[id number]]  (Abnormal) Collected:06/19/13 0442    Specimen Information:Blood / Blood Updated:06/19/13 0736     WBC 9.87 x10 3/uL      RBC 3.39 (L) x10 6/uL      Hgb 10.7 (L) g/dL      Hematocrit 31.5 (L) %      MCV 92.9 fL      MCH 31.6 pg      MCHC 34.0 g/dL      RDW 13 %      Platelets 194 x10 3/uL      MPV 9.2 (L) fL      Neutrophils 91 %      Lymphocytes Automated 6 %      Monocytes 3 %      Eosinophils Automated 0 %      Basophils Automated 0 %      Immature Granulocyte 0 %      Nucleated RBC 0      Neutrophils Absolute 8.95 (H) x10 3/uL      Abs Lymph Automated 0.62 x10 3/uL      Abs Mono  Automated 0.30 x10 3/uL      Abs Eos Automated 0.00 x10 3/uL      Absolute Baso Automated 0.00 x10 3/uL      Absolute Immature Granulocyte 0.02 x10 3/uL     Basic Metabolic Panel [[id number]]  (Abnormal) Collected:06/19/13 0442    Specimen Information:Blood Updated:06/19/13 0519     Glucose 154 (H) mg/dL      BUN 18 mg/dL  Creatinine 1.0 mg/dL      CALCIUM 8.6 mg/dL      Sodium 136 mEq/L      Potassium 4.5 mEq/L      Chloride 107 mEq/L      CO2 24 mEq/L     GFR [[id number]] Collected:06/19/13 0442     EGFR >60.0 Updated:06/19/13 0519    Type and screen [[id number]] Collected:06/18/13 1244    Specimen Information:Blood Updated:06/18/13 1342     ABO Rh O POS      AB Screen Gel NEG           Rads:   Xr Shoulder Left 1 View    06/18/2013  CLINICAL INDICATION: Postsurgical status post arthroplasty  COMPARISON: None  FINDINGS: Portable AP neutral view. Status post     joint arthroplasty with anatomic alignment. No fracture or dislocation. Post surgical changes with subcutaneous emphysema, skin staples and drainage catheter.      06/18/2013   Status post arthroplasty with anatomical alignment.  Odette Fraction, MD  06/18/2013 8:03 PM       Signed by: Loel Ro Zoiee Wimmer,MD  06/19/2013

## 2013-06-19 NOTE — Progress Notes (Signed)
Numbness to left arm has resolved, pt is able to wiggle fingers, able to feel and make movements with hand.

## 2013-06-19 NOTE — Progress Notes (Signed)
Pain svc  S/P total shoulder under GA w/ post op ISB w/ catheter. Catheter pulled tip intact. Moving arm good analgesia has sensation back

## 2013-06-19 NOTE — Plan of Care (Signed)
Problem: Day of Surgery Post-Shoulder Replacement Surgery  Goal: Hemodynamic stability (Day of Surgery Post-Shoulder Replacement Surgery)  Outcome: Completed Date Met:  06/19/13  Pt. Is alert and oriented x3, denies pain, left arm with numbness, SAT >92% on 2LNC, VSS, IVF infusing, Voiding, exercises encouraged, surgical dressing Guaze and Tegaderm C/D/I, HV intact, sling in place will continue to monitor.   Goal: Pain at adequate level as identified by patient (Day of Surgery Post-Shoulder Replacement Surgery)  Outcome: Completed Date Met:  06/19/13  Denies pain at this time, on Ropivacaine at 6cc/hr and Percocet and OxyContin, left arm supported with sling and pillow.   Goal: Stable neurovascular status (Day of Surgery Post-Shoulder Replacement Surgery)  Outcome: Progressing  Pt . Is numb to left arm, unable  to wiggle fingers or feel, on Continuous Ropivacaine at 6cc/hr. Brachial and Radial pulses are palpable and strong, cap refill <3 secs, will monitor.   Goal: Mobility/activity is maintained at optimal level for patient (Day of Surgery Post-Shoulder Replacement Surgery)  Outcome: Completed Date Met:  06/19/13  Pt. Was OOB DOS with assistance.

## 2013-06-19 NOTE — OT Eval Note (Signed)
[facility]  [place], [place] [address]  [phone]    Occupational Therapy Evaluation    Patient: Gregory [name] MRN: [id number]   Unit: [facility] 4C Bed: M455/M455.01    Time of treatment: Time Calculation  OT Received On: 06/19/13  Start Time: 1030  Stop Time: 1130  Time Calculation (min): 60 min    Consult received for [name] for OT evaluation and treatment.  Patient's medical condition is appropriate for Occupational Therapy  intervention at this time.    Medical Diagnosis: Primary localized osteoarthrosis, shoulder region [715.11] (Primary localized osteoarthrosis, shoulder region [715.11])  Primary localized osteoarthrosis, shoulder region    History of Present Illness: Gregory [name] is a 68 y.o. male admitted on  06/18/2013 for elective:   TITLE OF PROCEDURE:   Left total shoulder arthroplasty.      Patient Active Problem List   Diagnosis   . Ganglion cyst of wrist, right   . Primary localized osteoarthrosis, shoulder region   . Asthma without status asthmaticus     Past Medical History   Diagnosis Date   . Asthma without status asthmaticus      childhood   . Arthritis      bilat shoulders, lt knee   . Fracture of unspecified bones      1974 lt shoulder     Past Surgical History   Procedure Date   . Colonoscopy    . Excision, ganglion 12/19/2012     Procedure: EXCISION, GANGLION;  Surgeon: Jillyn Hidden, MD;  Location: [facility];  Service: Orthopedics;  Laterality: Right;  injection right and left shoulder   . Injection, medication 12/19/2012     Procedure: INJECTION, MEDICATION;  Surgeon: Jillyn Hidden, MD;  Location: [facility];  Service: Orthopedics;  Laterality: Bilateral;   . Hernia repair      lt inguinal age 29     Precautions: Falls, shoulder precautions (NWB, No ER beyond 30 degrees, no active IR, sling at all times except for showers and exercises)    X-Rays/Tests/Labs:  Results for Gregory [name] (MRN [id number]) as of  06/19/2013 14:43   Ref. Range 06/19/2013 04:42   Hemoglobin Latest Range: 13.0-17.0 g/dL 10.7 (L)   Hematocrit Latest Range: 42.0-52.0 % 31.5 (L)     Social History:  Lives with his wife in a house.  Entry Steps: 2   Rails: yes Inside steps: 2 flights  Rails: yes (both sides)  Equipment at home: walk in tub, toilet seat riser  Prior Level of Function:    Cognition: intact    Independent with all self care and functional mobility    Subjective: Patient is agreeable to participation in the therapy session. Nursing clears patient for therapy.  Patient's Goal:  To go home today  Pain: 0/10    Objective:  Patient is in bed with  Intravenous Access and L UE sling in place.      Observation of patient/vitals:   Filed Vitals:    06/18/13 2200 06/18/13 2300 06/18/13 2357 06/19/13 0501   BP: 139/80 138/77 125/76 118/75   Pulse: 95 111 109 97   Temp: 98.7 F (37.1 C)  98.3 F (36.8 C) 98.1 F (36.7 C)   TempSrc: Oral  Oral Oral   Resp: _0 Height:       Weight:       SpO2: 98% 98% 98% 97%  Orientation/Cognition:     Alert and Oriented x 4  Cognition: intact, slow processing at first due to medication    Musculoskeletal Examination:          ROM Strength   RUE WFL 4/5 shoulder, 5/5 distal   LUE Elbow, wrist, hand WFL Distal at least 3/5     Sensation: Intact to light touch, localization, proprioception throughout B UE's.   Coordination: Intact gross motor and thumb to finger touch B hands   Vision: WFL  Hearing: WFL    Functional Mobility:  Rolling: NT    Scooting: independent  Supine to sit: independent  Sit to Supine: independent  Sit to stand: min assist, progressed to independent  Stand to sit: min assist, progressed to independent  Transfers: min assist, progressed to independent  Ambulation: min assist at first, progressed to supervision/independent, ambulated 200' with no AD  Stairs: one flight up and down with railings with supervision    Balance:  Static Sit Balance: good  Dynamic Sit Balance:  good  Static Stand Balance: good  Dynamic Stand Balance: fair+    Self Care:  Eating: independent   Grooming: independent   Bathing: min assist  UB Dressing: mod assist  Sling Management: mod assist  LB Dressing: modified independent  Toileting: modified independent    UE Exercises: 1 set of 10  Shoulder Forward Flexion, gentle PROM to 70*  Elbow Flexion/Extension  Wrist Circumduction, both directions  Hand Open and Close    Endurance: good    Participation:  good    Education:  Educated the patient to role of occupational therapy, plan of care, goals  of therapy and safety with mobility and ADLs, shoulder precautions, discharge instructions, home safety.  Pt issued handouts on HEP, shoulder precautions and post op instructions.  Pt educated on shoulder precautions (No active ER, abduction or flexion of shoulder, no weight bearing to operated UE).  Pt educated/trained on proper technique to don and doff sling.  Pt educated/trained on modified upper body dressing technique.   Pt educated/trained on AROM exercises to affected UE (elbow flexion and extension, wrist flexion and extension, hand flexion and extension, gentle PROM shoulder forward flexion in supine,.     Assessment:  [name] is a very pleasant and cooperative 68 y.o. male admitted 06/18/2013. Pt's ability to complete ADLs and functional transfers is impaired due to the following deficits:  Immobilized L UE.   Pt requires assistance with UB bathing, dressing, and sling management.  Pt states his wife will be able to assist at home and that he now knows how to tell her to how to help him appropriately.  Pt is independent with functional mobility, toileting and LB ADL's.  Verbalized understanding of post of op instructions and HEP.  Pt issued ice pack and incentive spirometer and educated on use of both.  No further OT indicated at this time.  Pt is safe for discharge home with family assist per OT.    Patient is in bed with needs met, and call bell  within reach. RN notified of session outcome.  Mobility and ADL status posted at bedside.    Goals:  Goal Formulation: Patient  Time For Goal Achievement: 1 visit  Patient will dress lower body: modified independently  Patient will toilet: modified independently  Pt will perform functional transfers: modified independently  Other Goal: Pt will ambulate 150' with modified independence for safety at home.  Other Goal #2: Pt will go up/down a flight  of steps with supervision.  Pt will perform Home Exercise Program: with caregiver/family assist    OT Plan  Treatment Interventions: ADL retraining;UE strengthening/ROM;Functional transfer training;Patient/Family training;Equipment eval/education  Discharge Recommendation: Home with supervision  OT Frequency Recommended: one time visit    Signature:   Ruthe Mannan, OT  06/19/2013  2:42 PM  Phone: [phone]

## 2013-06-21 ENCOUNTER — Encounter: Payer: Self-pay | Admitting: Orthopaedic Surgery

## 2013-06-25 ENCOUNTER — Ambulatory Visit (INDEPENDENT_AMBULATORY_CARE_PROVIDER_SITE_OTHER): Payer: Medicare Other | Admitting: General Surgery

## 2013-07-21 ENCOUNTER — Other Ambulatory Visit (INDEPENDENT_AMBULATORY_CARE_PROVIDER_SITE_OTHER): Payer: Self-pay | Admitting: General Surgery

## 2013-07-21 ENCOUNTER — Encounter (INDEPENDENT_AMBULATORY_CARE_PROVIDER_SITE_OTHER): Payer: Self-pay | Admitting: General Surgery

## 2013-07-21 ENCOUNTER — Encounter (INDEPENDENT_AMBULATORY_CARE_PROVIDER_SITE_OTHER): Payer: Self-pay

## 2013-07-21 ENCOUNTER — Ambulatory Visit (INDEPENDENT_AMBULATORY_CARE_PROVIDER_SITE_OTHER): Payer: Medicare Other | Admitting: General Surgery

## 2013-07-21 VITALS — BP 118/70 | HR 68 | Temp 97.3°F | Resp 14 | Ht 71.0 in | Wt 165.2 lb

## 2013-07-21 DIAGNOSIS — K4090 Unilateral inguinal hernia, without obstruction or gangrene, not specified as recurrent: Secondary | ICD-10-CM

## 2013-07-21 DIAGNOSIS — E785 Hyperlipidemia, unspecified: Secondary | ICD-10-CM | POA: Insufficient documentation

## 2013-07-21 DIAGNOSIS — N433 Hydrocele, unspecified: Secondary | ICD-10-CM

## 2013-07-21 DIAGNOSIS — I10 Essential (primary) hypertension: Secondary | ICD-10-CM | POA: Diagnosis not present

## 2013-07-21 NOTE — Progress Notes (Signed)
Patient ID: Dustin JarredWilliam C Klemann, male   DOB: January 01, 1946, 68 y.o.   MRN: [id number]  Chief Complaint  Patient presents with  . New Evaluation    eval RIH    HPI Dustin Maynard is a 68 y.o. male.   HPI  He is referred by Dr. Tiburcio PeaHarris for further evaluation and treatment of a right inguinal hernia. He states he's noticed a bulge in the right groin area and scrotum for about 4 years. It intermittently causes him some discomfort when he is doing some heavy lifting. No difficulty with voiding. No constipation. No chronic cough. He thinks his father may have had a left inguinal hernia.  Past Medical History  Diagnosis Date  . Hypertension     History reviewed. No pertinent past surgical history.  Family History  Problem Relation Age of Onset  . Heart disease Mother     Social History History  Substance Use Topics  . Smoking status: Former Smoker    Quit date: 07/21/2010  . Smokeless tobacco: Never Used  . Alcohol Use: No    Not on File  Current Outpatient Prescriptions  Medication Sig Dispense Refill  . amLODipine (NORVASC) 10 MG tablet       . lisinopril (PRINIVIL,ZESTRIL) 10 MG tablet       . Multiple Vitamins-Minerals (CENTRUM SILVER PO) Take by mouth.      . niacin 500 MG tablet Take 500 mg by mouth at bedtime.       No current facility-administered medications for this visit.    Review of Systems Review of Systems  Constitutional: Negative.   HENT: Negative.   Respiratory: Negative.   Cardiovascular: Negative.   Gastrointestinal: Negative.   Genitourinary: Negative.   Musculoskeletal: Negative.   Neurological: Negative.   Hematological: Negative.     Blood pressure 118/70, pulse 68, temperature 97.3 F (36.3 C), temperature source Temporal, resp. rate 14, height 5\' 11"  (1.803 m), weight 165 lb 3.2 oz (74.934 kg).  Physical Exam Physical Exam  Constitutional: He appears well-developed and well-nourished. No distress.  HENT:  Head: Normocephalic and atraumatic.   Eyes: No scleral icterus.  Cardiovascular: Normal rate and regular rhythm.   Pulmonary/Chest: Effort normal and breath sounds normal.  Abdominal: Soft. He exhibits no distension and no mass. There is no tenderness.  Genitourinary:  Right scrotal swelling that transilluminates. Right inguinal bulge with Valsalva maneuver. Left testicle appears normal in size without mass. No left inguinal bulge.  Neurological: He is alert.  Skin: Skin is warm and dry.  Psychiatric: He has a normal mood and affect. His behavior is normal.    Data Reviewed Note from Dr. Tiburcio PeaHarris  Assessment    1. Right inguinal hernia 2. Right hydrocele     Plan    We discussed repair of right inguinal hernia with mesh and possibly repair of the hydrocele. He is interested in this. I will refer him to a urologist to discuss repair of the right hydrocele. We should be able to do these 2 procedures together.  I have explained the procedure, risks, and aftercare of inguinal hernia repair.  Risks include but are not limited to bleeding, infection, wound problems, anesthesia, recurrence, bladder or intestine injury, urinary retention, testicular dysfunction, chronic pain, mesh problems.  He seems to understand and agrees with the plan.        Salimata Christenson J 07/21/2013, 9:57 AM

## 2013-07-21 NOTE — Patient Instructions (Signed)
Hydrocele, Adult Fluid can collect around the testicles. This fluid forms in a sac. This condition is called a hydrocele. The collected fluid causes swelling of the scrotum. Usually, it affects just one testicle. Most of the time, the condition does not cause pain. Sometimes, the hydrocele goes away on its own. Other times, surgery is needed to get rid of the fluid. CAUSES A hydrocele does not develop often. Different things can cause a hydrocele in a man, including:  Injury to the scrotum.  Infection.  X-ray of the area around the scrotum.  A tumor or cancer of the testicle.  Twisting of a testicle.  Decreased blood flow to the scrotum. SYMPTOMS   Swelling without pain. The hydrocele feels like a water-filled balloon.  Swelling with pain. This can occur if the hydrocele was caused by infection or twisting.  Mild discomfort in the scrotum.  The hydrocele may feel heavy.  Swelling that gets smaller when you lie down. DIAGNOSIS  Your caregiver will do a physical exam to decide if you have a hydrocele. This may include:  Asking questions about your overall health, today and in the past. Your caregiver may ask about any injuries, X-rays, or infections.  Pushing on your abdomen or asking you to change positions to see if the size of the hydrocele changes.  Shining a light through the scrotum (transillumination) to see if the fluid inside the scrotum is clear.  Blood tests and urine tests to check for infection.  Imaging studies that take pictures of the scrotum and testicles. TREATMENT  Treatment depends in part on what caused the condition. Options include:  Watchful waiting. Your caregiver checks the hydrocele every so often.  Different surgeries to drain the fluid.  A needle may be put into the scrotum to drain fluid (needle aspiration). Fluid often returns after this type of treatment.  A cut (incision) may be made in the scrotum to remove the fluid sac  (hydrocelectomy).  An incision may be made in the groin to repair a hydrocele that has contact with abdominal fluids (communicating hydrocele).  Medicines to treat an infection (antibiotics). HOME CARE INSTRUCTIONS  What you need to do at home may depend on the cause of the hydrocele and type of treatment. In general:  Take all medicine as directed by your caregiver. Follow the directions carefully.  Ask your caregiver if there is anything you should not do while you recover (activities, lifting, work, sex).  If you had surgery to repair a communicating hydrocele, recovery time may vary. Ask you caregiver about your recovery time.  Avoid heavy lifting for 4 to 6 weeks.  If you had an incision on the scrotum or groin, wash it for 2 to 3 days after surgery. Do this as long as the skin is closed and there are no gaps in the wound. Wash gently, and avoid rubbing the incision.  Keep all follow-up appointments. SEEK MEDICAL CARE IF:   Your scrotum seems to be getting larger.  The area becomes more and more uncomfortable. SEEK IMMEDIATE MEDICAL CARE IF:  You have a fever.    [facility] _____[facility]  UMBILICAL OR INGUINAL HERNIA REPAIR: POST OP INSTRUCTIONS  Always review your discharge instruction sheet given to you by the facility where your surgery was performed. IF YOU HAVE DISABILITY OR FAMILY LEAVE FORMS, YOU MUST BRING THEM TO THE OFFICE FOR PROCESSING.   DO NOT GIVE THEM TO YOUR DOCTOR.  1. A  prescription for pain medication may  be given to you upon discharge.  Take your pain medication as prescribed, if needed.  If narcotic pain medicine is not needed, then you may take acetaminophen (Tylenol) or ibuprofen (Advil) as needed. 2. Take your usually prescribed medications unless otherwise directed. 3. If you need a refill on your pain medication, please contact your pharmacy.  They will contact our office to request authorization. Prescriptions will not be filled  after 5 pm or on week-ends. 4. You should follow a light diet the first 24 hours after arrival home, such as soup and crackers, etc.  Be sure to include lots of fluids daily.  Resume your normal diet the day after surgery. 5. Most patients will experience some swelling and bruising around the umbilicus or in the groin and scrotum.  Ice packs and reclining will help.  Swelling and bruising can take several days to resolve.  6. It is common to experience some constipation if taking pain medication after surgery.  Increasing fluid intake and taking a stool softener (such as Colace) will usually help or prevent this problem from occurring.  A mild laxative (Milk of Magnesia or Miralax) should be taken according to package directions if there are no bowel movements after 48 hours. 7. Unless discharge instructions indicate otherwise, you may remove your bandages 24-48 hours after surgery, and you may shower at that time.  You may have steri-strips (small skin tapes) in place directly over the incision.  These strips should be left on the skin for 7-10 days.  If your surgeon used skin glue on the incision, you may shower in 24 hours.  The glue will flake off over the next 2-3 weeks.  Any sutures or staples will be removed at the office during your follow-up visit. 8. ACTIVITIES:  You may resume regular (light) daily activities beginning the next day-such as daily self-care, walking, climbing stairs-gradually increasing activities as tolerated.  You may have sexual intercourse when it is comfortable.  Refrain from any heavy lifting or straining until approved by your doctor. a. You may drive when you are no longer taking prescription pain medication, you can comfortably wear a seatbelt, and you can safely maneuver your car and apply brakes. b. RETURN TO WORK:  __________________________________________________________ 9. You should see your doctor in the office for a follow-up appointment approximately 2-3 weeks  after your surgery.  Make sure that you call for this appointment within a day or two after you arrive home to insure a convenient appointment time. 10. OTHER INSTRUCTIONS:  __________________________________________________________________________________________________________________________________________________________________________________________  WHEN TO CALL YOUR DOCTOR: 1. Fever over 101.0 2. Inability to urinate 3. Nausea and/or vomiting 4. Extreme swelling or bruising 5. Continued bleeding from incision. 6. Increased pain, redness, or drainage from the incision  The clinic staff is available to answer your questions during regular business hours.  Please don't hesitate to call and ask to speak to one of the nurses for clinical concerns.  If you have a medical emergency, go to the nearest emergency room or call 911.  A surgeon from [facility] is always on call at the hospital   [address], [place], [place]  [address] ?  [address] [address], [place], [place]   [address] [phone] ? [phone] ? FAX [phone] Web site: [email or web address]

## 2013-07-29 ENCOUNTER — Telehealth (INDEPENDENT_AMBULATORY_CARE_PROVIDER_SITE_OTHER): Payer: Self-pay | Admitting: *Deleted

## 2013-07-29 NOTE — Telephone Encounter (Signed)
I spoke with pt and informed him of the appt with Dr. Margarita GrizzleWoodruff at [facility] on 08/05/13 with an arrival time of 3:15pm.  I informed pt of their address.  He is agreeable with this appt.

## 2013-08-05 DIAGNOSIS — N433 Hydrocele, unspecified: Secondary | ICD-10-CM | POA: Diagnosis not present

## 2013-09-20 ENCOUNTER — Other Ambulatory Visit: Payer: Self-pay | Admitting: Family Medicine

## 2013-09-20 DIAGNOSIS — J329 Chronic sinusitis, unspecified: Secondary | ICD-10-CM

## 2013-09-20 MED ORDER — AZITHROMYCIN 250 MG PO TABS: ORAL_TABLET | ORAL | Status: DC

## 2013-09-22 ENCOUNTER — Other Ambulatory Visit: Payer: Self-pay | Admitting: Family Medicine

## 2013-09-22 DIAGNOSIS — J329 Chronic sinusitis, unspecified: Secondary | ICD-10-CM

## 2013-09-22 MED ORDER — AMOXICILLIN-POT CLAVULANATE 875-125 MG PO TABS: 1.0000 | ORAL_TABLET | Freq: Two times a day (BID) | ORAL | Status: DC

## 2013-09-24 ENCOUNTER — Encounter: Payer: Self-pay | Admitting: Family Medicine

## 2013-09-24 ENCOUNTER — Ambulatory Visit (INDEPENDENT_AMBULATORY_CARE_PROVIDER_SITE_OTHER): Payer: Medicare Other | Admitting: Family Medicine

## 2013-09-24 VITALS — BP 122/78 | HR 76 | Temp 97.9°F | Resp 16 | Ht 76.0 in | Wt 201.0 lb

## 2013-09-24 DIAGNOSIS — R040 Epistaxis: Secondary | ICD-10-CM

## 2013-09-24 DIAGNOSIS — J209 Acute bronchitis, unspecified: Secondary | ICD-10-CM

## 2013-09-24 MED ORDER — PREDNISONE 20 MG PO TABS: ORAL_TABLET | ORAL | Status: DC

## 2013-09-24 MED ORDER — HYDROCOD POLST-CHLORPHEN POLST 10-8 MG/5ML PO LQCR: 5.0000 mL | Freq: Two times a day (BID) | ORAL | Status: DC | PRN

## 2013-09-24 NOTE — Patient Instructions (Signed)
Afrin nasal spray at night (active ingredient is oxymetazoline)

## 2013-09-24 NOTE — Progress Notes (Signed)
Patient ID: VERNICE BOWKER MRN: [id number], DOB: Apr 03, 1946, 68 y.o. Date of Encounter: 09/24/2013, 1:37 PM  Primary Physician: Robyn Haber, MD  Chief Complaint:  Chief Complaint  Patient presents with  . Cough  . sinus drainage    HPI: 68 y.o. year old male presents with a 14 day history of nasal congestion, post nasal drip, sore throat, and cough. Mild sinus pressure. Afebrile. No chills. Nasal congestion thick and green/yellow. Cough is productive of green/yellow sputum and not associated with time of day. Ears feel full, leading to sensation of muffled hearing. Has tried OTC cold preps without success. No GI complaints.   No sick contacts.  recent antibiotics include Levaquin, z-pak and now Augmentin, no recent travels.   No leg trauma, sedentary periods, h/o cancer, or tobacco use.  Past Medical History  Diagnosis Date  . Hemorrhoids   . Rectal bleeding     due to hemorrhoid   . Hypertension   . Hypothyroid      Home Meds: Prior to Admission medications   Medication Sig Start Date End Date Taking? Authorizing Provider  ALPRAZolam (XANAX) 0.25 MG tablet Take 1 tablet (0.25 mg total) by mouth 2 (two) times daily as needed for sleep. 04/06/13  Yes Robyn Haber, MD  amoxicillin-clavulanate (AUGMENTIN) 875-125 MG per tablet Take 1 tablet by mouth 2 (two) times daily. 09/22/13  Yes Robyn Haber, MD  aspirin 81 MG tablet Take 81 mg by mouth every other day.     Yes Historical Provider, MD  hydrocortisone (ANUSOL-HC) 2.5 % rectal cream Place rectally 2 (two) times daily. 04/06/13  Yes Robyn Haber, MD  lactobacillus acidophilus (BACID) TABS tablet Take 2 tablets by mouth 3 (three) times daily.   Yes Historical Provider, MD  levothyroxine (SYNTHROID, LEVOTHROID) 100 MCG tablet Take 1 tablet (100 mcg total) by mouth daily. 07/28/12  Yes Robyn Haber, MD  Multiple Vitamins-Minerals (CENTRUM SILVER PO) Take by mouth daily.     Yes Historical Provider, MD  NASONEX 50  MCG/ACT nasal spray USE AS DIRECTED 12/23/12  Yes Robyn Haber, MD  Omega-3 Fatty Acids (FISH OIL) 1000 MG CAPS Take by mouth 3 (three) times daily.     Yes Historical Provider, MD  chlorpheniramine-HYDROcodone (TUSSIONEX PENNKINETIC ER) 10-8 MG/5ML LQCR Take 5 mLs by mouth every 12 (twelve) hours as needed. 09/24/13   Robyn Haber, MD  predniSONE (DELTASONE) 20 MG tablet 2 daily with food 09/24/13   Robyn Haber, MD    Allergies: No Known Allergies  History   Social History  . Marital Status: Married    Spouse Name: N/A    Number of Children: N/A  . Years of Education: N/A   Occupational History  . Not on file.   Social History Main Topics  . Smoking status: Former Smoker    Quit date: 06/13/1971  . Smokeless tobacco: Never Used  . Alcohol Use: 1.2 oz/week    2 Cans of beer per week     Comment: Monthly.  . Drug Use: No  . Sexual Activity: Not on file   Other Topics Concern  . Not on file   Social History Narrative  . No narrative on file     Review of Systems: Constitutional: negative for chills, fever, night sweats or weight changes Cardiovascular: negative for chest pain or palpitations Respiratory: negative for hemoptysis, wheezing, or shortness of breath Abdominal: negative for abdominal pain, nausea, vomiting or diarrhea Dermatological: negative for rash Neurologic: negative for headache   Physical Exam:  Blood pressure 122/78, pulse 76, temperature 97.9 F (36.6 C), resp. rate 16, height 6\' 4"  (1.93 m), weight 201 lb (91.173 kg), SpO2 96.00%., Body mass index is 24.48 kg/(m^2). General: Well developed, well nourished, in no acute distress. Head: Normocephalic, atraumatic, eyes without discharge, sclera non-icteric, nares are congested. Bilateral auditory canals clear, TM's are without perforation, pearly grey with reflective cone of light bilaterally. No sinus TTP. Oral cavity moist, dentition normal. Posterior pharynx with post nasal drip and mild  erythema. No peritonsillar abscess or tonsillar exudate.  Bilateral mild bleeding from posterior septum Neck: Supple. No thyromegaly. Full ROM. No lymphadenopathy. Lungs: Coarse breath sounds bilaterally without  rales, or rhonchi. Breathing is unlabored. Faint exp wheezes Heart: RRR with S1 S2. No murmurs, rubs, or gallops appreciated. Msk:  Strength and tone normal for age. Extremities: No clubbing or cyanosis. No edema. Neuro: Alert and oriented X 3. Moves all extremities spontaneously. CNII-XII grossly in tact. Psych:  Responds to questions appropriately with a normal affect.   Labs:   ASSESSMENT AND PLAN:  68 y.o. year old male with bronchitis. -Acute bronchitis - Plan: predniSONE (DELTASONE) 20 MG tablet, chlorpheniramine-HYDROcodone (TUSSIONEX PENNKINETIC ER) 10-8 MG/5ML LQCR   -Mucinex -Tylenol/Motrin prn -Rest/fluids -RTC precautions -RTC 3-5 days if no improvement  Signed, Robyn Haber, MD 09/24/2013 1:37 PM

## 2013-10-06 ENCOUNTER — Ambulatory Visit (INDEPENDENT_AMBULATORY_CARE_PROVIDER_SITE_OTHER): Payer: Medicare Other | Admitting: Family Medicine

## 2013-10-06 ENCOUNTER — Encounter: Payer: Self-pay | Admitting: Family Medicine

## 2013-10-06 ENCOUNTER — Telehealth: Payer: Self-pay | Admitting: *Deleted

## 2013-10-06 VITALS — BP 132/78 | HR 76 | Temp 98.0°F | Resp 16 | Ht 75.0 in | Wt 203.0 lb

## 2013-10-06 DIAGNOSIS — E039 Hypothyroidism, unspecified: Secondary | ICD-10-CM | POA: Insufficient documentation

## 2013-10-06 DIAGNOSIS — R972 Elevated prostate specific antigen [PSA]: Secondary | ICD-10-CM

## 2013-10-06 DIAGNOSIS — K649 Unspecified hemorrhoids: Secondary | ICD-10-CM

## 2013-10-06 DIAGNOSIS — Z0000 Encounter for general adult medical examination without abnormal findings: Secondary | ICD-10-CM

## 2013-10-06 DIAGNOSIS — J209 Acute bronchitis, unspecified: Secondary | ICD-10-CM

## 2013-10-06 DIAGNOSIS — K529 Noninfective gastroenteritis and colitis, unspecified: Secondary | ICD-10-CM

## 2013-10-06 DIAGNOSIS — K5289 Other specified noninfective gastroenteritis and colitis: Secondary | ICD-10-CM

## 2013-10-06 DIAGNOSIS — R040 Epistaxis: Secondary | ICD-10-CM

## 2013-10-06 DIAGNOSIS — K648 Other hemorrhoids: Secondary | ICD-10-CM

## 2013-10-06 LAB — POCT URINALYSIS DIPSTICK
Bilirubin, UA: NEGATIVE
Blood, UA: NEGATIVE
Glucose, UA: NEGATIVE
Ketones, UA: NEGATIVE
Leukocytes, UA: NEGATIVE
Nitrite, UA: NEGATIVE
Protein, UA: NEGATIVE
Spec Grav, UA: 1.02
Urobilinogen, UA: 0.2
pH, UA: 5.5

## 2013-10-06 LAB — COMPLETE METABOLIC PANEL WITH GFR
ALT: 16 U/L (ref 0–53)
AST: 24 U/L (ref 0–37)
Albumin: 4.1 g/dL (ref 3.5–5.2)
Alkaline Phosphatase: 65 U/L (ref 39–117)
BUN: 24 mg/dL — ABNORMAL HIGH (ref 6–23)
CO2: 19 mEq/L (ref 19–32)
Calcium: 8.7 mg/dL (ref 8.4–10.5)
Chloride: 102 mEq/L (ref 96–112)
Creat: 0.99 mg/dL (ref 0.50–1.35)
GFR, Est African American: >89 mL/min
GFR, Est Non African American: 79 mL/min
Glucose, Bld: 91 mg/dL (ref 70–99)
Potassium: 4.9 mEq/L (ref 3.5–5.3)
Sodium: 135 mEq/L (ref 135–145)
Total Bilirubin: 0.7 mg/dL (ref 0.2–1.2)
Total Protein: 6.9 g/dL (ref 6.0–8.3)

## 2013-10-06 LAB — CBC WITH DIFFERENTIAL/PLATELET
Basophils Absolute: 0.1 10*3/uL (ref 0.0–0.1)
Basophils Relative: 1 % (ref 0–1)
Eosinophils Absolute: 0.1 10*3/uL (ref 0.0–0.7)
Eosinophils Relative: 2 % (ref 0–5)
HCT: 47 % (ref 39.0–52.0)
Hemoglobin: 16.7 g/dL (ref 13.0–17.0)
Lymphocytes Relative: 29 % (ref 12–46)
Lymphs Abs: 1.8 10*3/uL (ref 0.7–4.0)
MCH: 30.6 pg (ref 26.0–34.0)
MCHC: 35.5 g/dL (ref 30.0–36.0)
MCV: 86.2 fL (ref 78.0–100.0)
Monocytes Absolute: 0.6 10*3/uL (ref 0.1–1.0)
Monocytes Relative: 10 % (ref 3–12)
Neutro Abs: 3.6 10*3/uL (ref 1.7–7.7)
Neutrophils Relative %: 58 % (ref 43–77)
Platelets: 217 10*3/uL (ref 150–400)
RBC: 5.45 MIL/uL (ref 4.22–5.81)
RDW: 13.7 % (ref 11.5–15.5)
WBC: 6.2 10*3/uL (ref 4.0–10.5)

## 2013-10-06 LAB — LIPID PANEL
Cholesterol: 219 mg/dL — ABNORMAL HIGH (ref 0–200)
HDL: 46 mg/dL (ref >39)
LDL Cholesterol: 149 mg/dL — ABNORMAL HIGH (ref 0–99)
Total CHOL/HDL Ratio: 4.8 Ratio
Triglycerides: 120 mg/dL (ref <150)
VLDL: 24 mg/dL (ref 0–40)

## 2013-10-06 LAB — IFOBT (OCCULT BLOOD): IFOBT: NEGATIVE

## 2013-10-06 MED ORDER — PREDNISONE 20 MG PO TABS: ORAL_TABLET | ORAL | Status: DC

## 2013-10-06 MED ORDER — ALPRAZOLAM 0.25 MG PO TABS: 0.2500 mg | ORAL_TABLET | Freq: Two times a day (BID) | ORAL | Status: DC | PRN

## 2013-10-06 NOTE — Progress Notes (Signed)
Patient ID: BUZZ AXEL MRN: [id number], DOB: February 17, 1946 68 y.o. Date of Encounter: 10/06/2013, 12:45 PM  Primary Physician: Robyn Haber, MD  Chief Complaint: Physical (CPE)  HPI: 68 y.o. y/o male with history noted below here for CPE.  Patient has had a recent upper respiratory infection and is doing fairly well now. He still has a little bit of cough and congestion but most of it is cleared. He's taking his Nasonex. He has had some night sweats last week but nothing this week.  Patient continues to have some epistaxis. It's less than he had in the past few weeks but it's still there.  Patient also continues to have blood on tissue when wiping. He's had hemorrhoids banded in the past and has had diverticulosis on sigmoidoscopy 9 years ago. He notes that he does have some irritable bowel type syndromes with increased gas and tenesmus when he is anxious.  Patient is now retired and spends a couple days a week with his wife taking care of his grandchildren in [place]. Patient sees Dr. Jasmine December for urology Patient sees Dr. Neysa Bonito for hemorrhoids Patient sees Dr. Wilhemina Bonito dermatology Patient sees Jule Ser for his dentistry Patient sees Dr. Sharyne Peach for ophthalmology  Review of Systems: Consitutional: No fever, chills, fatigue, night sweats, lymphadenopathy, or weight changes. Eyes: No visual changes, eye redness, or discharge. ENT/Mouth: Ears: No otalgia, tinnitus, hearing loss, discharge. Nose: No congestion, rhinorrhea, sinus pain, or epistaxis. Throat: No sore throat, post nasal drip, or teeth pain. Cardiovascular: No CP, palpitations, diaphoresis, DOE, edema, orthopnea, PND. Respiratory: No cough, hemoptysis, SOB, or wheezing. Gastrointestinal: No anorexia, dysphagia, reflux, pain, nausea, vomiting, hematemesis, diarrhea, constipation, BRBPR, or melena. Genitourinary: No dysuria, frequency, urgency, hematuria, incontinence, nocturia, decreased urinary stream,  discharge, impotence, or testicular pain/masses. Musculoskeletal: No decreased ROM, myalgias, stiffness, joint swelling, or weakness. Skin: No rash, erythema, lesion changes, pain, warmth, jaundice, or pruritis. Neurological: No headache, dizziness, syncope, seizures, tremors, memory loss, coordination problems, or paresthesias. Psychological: No anxiety, depression, hallucinations, SI/HI. Endocrine: No fatigue, polydipsia, polyphagia, polyuria, or known diabetes. All other systems were reviewed and are otherwise negative.  Past Medical History  Diagnosis Date  . Hemorrhoids   . Rectal bleeding     due to hemorrhoid   . Hypertension   . Hypothyroid      Past Surgical History  Procedure Laterality Date  . Hernia repair      Over ten years ago per patient. He was not sure of the date.  . Rotator cuff repair      both shoulders - patient does not remember the exact date    Home Meds:  Prior to Admission medications   Medication Sig Start Date End Date Taking? Authorizing Provider  ALPRAZolam (XANAX) 0.25 MG tablet Take 1 tablet (0.25 mg total) by mouth 2 (two) times daily as needed for sleep. 10/06/13  Yes Robyn Haber, MD  aspirin 81 MG tablet Take 81 mg by mouth every other day.     Yes Historical Provider, MD  hydrocortisone (ANUSOL-HC) 2.5 % rectal cream Place rectally 2 (two) times daily. 04/06/13  Yes Robyn Haber, MD  lactobacillus acidophilus (BACID) TABS tablet Take 2 tablets by mouth 3 (three) times daily.   Yes Historical Provider, MD  levothyroxine (SYNTHROID, LEVOTHROID) 100 MCG tablet Take 1 tablet (100 mcg total) by mouth daily. 07/28/12  Yes Robyn Haber, MD  Multiple Vitamins-Minerals (CENTRUM SILVER PO) Take by mouth daily.     Yes Historical Provider, MD  NASONEX 50 MCG/ACT nasal spray USE AS DIRECTED 12/23/12  Yes Robyn Haber, MD  Omega-3 Fatty Acids (FISH OIL) 1000 MG CAPS Take by mouth 3 (three) times daily.     Yes Historical Provider, MD  predniSONE  (DELTASONE) 20 MG tablet 2 daily with food 10/06/13   Robyn Haber, MD    Allergies: No Known Allergies  History   Social History  . Marital Status: Married    Spouse Name: N/A    Number of Children: N/A  . Years of Education: N/A   Occupational History  . Not on file.   Social History Main Topics  . Smoking status: Former Smoker    Quit date: 06/13/1971  . Smokeless tobacco: Never Used  . Alcohol Use: 1.2 oz/week    2 Cans of beer per week     Comment: Monthly.  . Drug Use: No  . Sexual Activity: Not on file   Other Topics Concern  . Not on file   Social History Narrative  . No narrative on file    Family History  Problem Relation Age of Onset  . Aneurysm Mother   . Alzheimer's disease Father   . Cancer Sister     ovarian  . Stroke Brother   . Hypertension Brother     Physical Exam: Blood pressure 132/78, pulse 76, temperature 98 F (36.7 C), resp. rate 16, height 6\' 3"  (1.905 m), weight 203 lb (92.08 kg), SpO2 96.00%.  General: Well developed, well nourished, in no acute distress. HEENT: Normocephalic, atraumatic. Conjunctiva pink, sclera non-icteric. Pupils 2 mm constricting to 1 mm, round, regular, and equally reactive to light and accomodation. EOMI. Internal auditory canal clear. TMs with good cone of light and without pathology. Nasal mucosa pink. Nares are without discharge. No sinus tenderness. Oral mucosa pink. Dentition good. Pharynx without exudate. 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  Neck: Supple. Trachea midline. No thyromegaly. Full ROM. No lymphadenopathy. Lungs: Clear to auscultation bilaterally without wheezes, rales, or rhonchi. Breathing is of normal effort and unlabored. Cardiovascular: RRR with S1 S2. No murmurs, rubs, or gallops appreciated. Distal pulses 2+ symmetrically. No carotid or abdominal bruits Abdomen: Soft, non-tender, non-distended with  normoactive bowel sounds. No hepatosplenomegaly or masses. No rebound/guarding. No CVA tenderness. Without hernias.  Rectal: No external hemorrhoids or fissures. Rectal vault without masses. Patient does have moderate proctitis with inflamed anal tissues  Genitourinary:  circumcised male. No penile lesions. Testes descended bilaterally, and smooth without tenderness or masses.  Musculoskeletal: Full range of motion and 5/5 strength throughout. Without swelling, atrophy, tenderness, crepitus, or warmth. Extremities without clubbing, cyanosis, or edema. Calves supple. Skin: Warm and moist without erythema, ecchymosis, wounds, or rash. Neuro: A+Ox3. CN II-XII grossly intact. Moves all extremities spontaneously. Full sensation throughout. Normal gait. DTR 2+ throughout upper and lower extremities. Finger to nose intact. Psych:  Responds to questions appropriately with a normal affect.   Studies: CBC, CMET, Lipid, PSA, TSH pending UA: pending  Assessment/Plan:  68 y.o. y/o  male here for CPE Routine general medical examination at a health care facility - Plan: POCT urinalysis dipstick, IFOBT POC (occult bld, rslt in office), T4, free, PSA, Medicare, TSH, COMPLETE METABOLIC PANEL WITH GFR, Lipid panel, CBC with Differential  Epistaxis - Plan: Ambulatory referral to ENT  Hemorrhoids - Plan: Ambulatory referral to Gastroenterology  Gastroenteritis - Plan: ALPRAZolam (XANAX) 0.25 MG tablet  Acute bronchitis - Plan: predniSONE (DELTASONE)  20 MG tablet  Signed, Robyn Haber, MD   -  Signed, Robyn Haber, MD 10/06/2013 12:45 PM

## 2013-10-06 NOTE — Telephone Encounter (Signed)
Faxed prescription to patient's pharmacy, per Dr Joseph Art. Confirmation page received at 1:48 pm,

## 2013-10-06 NOTE — Patient Instructions (Signed)

## 2013-10-07 LAB — PSA, MEDICARE: PSA: 2.22 ng/mL (ref <=4.00)

## 2013-10-07 LAB — T4, FREE: Free T4: 1.66 ng/dL (ref 0.80–1.80)

## 2013-10-07 LAB — TSH: TSH: 1.586 u[IU]/mL (ref 0.350–4.500)

## 2013-10-12 ENCOUNTER — Encounter: Payer: Self-pay | Admitting: Gastroenterology

## 2013-10-12 ENCOUNTER — Other Ambulatory Visit: Payer: Self-pay | Admitting: Family Medicine

## 2013-10-12 DIAGNOSIS — K921 Melena: Secondary | ICD-10-CM

## 2013-10-15 ENCOUNTER — Other Ambulatory Visit: Payer: Self-pay | Admitting: Cardiovascular Disease

## 2013-10-15 DIAGNOSIS — R0602 Shortness of breath: Secondary | ICD-10-CM

## 2013-10-18 ENCOUNTER — Telehealth: Payer: Self-pay

## 2013-10-18 NOTE — Telephone Encounter (Signed)
Pt needs [place] to call in a 90-day supply of levothyroxine, 100 mcg, to [facility] at [phone].  Please call the patient at [phone] or [phone] once this is done.

## 2013-10-19 ENCOUNTER — Other Ambulatory Visit: Payer: Self-pay | Admitting: Family Medicine

## 2013-10-19 ENCOUNTER — Ambulatory Visit
Admission: RE | Admit: 2013-10-19 | Discharge: 2013-10-19 | Disposition: A | Discharge status: Home / Self Care | Payer: Medicare Other | Source: Ambulatory Visit | Attending: Cardiovascular Disease | Admitting: Cardiovascular Disease

## 2013-10-19 DIAGNOSIS — E039 Hypothyroidism, unspecified: Secondary | ICD-10-CM

## 2013-10-19 DIAGNOSIS — R6889 Other general symptoms and signs: Secondary | ICD-10-CM | POA: Insufficient documentation

## 2013-10-19 DIAGNOSIS — R0602 Shortness of breath: Secondary | ICD-10-CM

## 2013-10-19 MED ORDER — LEVOTHYROXINE SODIUM 100 MCG PO TABS: 100.0000 ug | ORAL_TABLET | Freq: Every day | ORAL | Status: DC

## 2013-10-19 NOTE — Telephone Encounter (Signed)
Pended please advise.  

## 2013-10-19 NOTE — Progress Notes (Signed)
No message available.Please advise.

## 2013-10-26 DIAGNOSIS — J340 Abscess, furuncle and carbuncle of nose: Secondary | ICD-10-CM | POA: Insufficient documentation

## 2013-10-27 MED ORDER — LEVOTHYROXINE SODIUM 100 MCG PO TABS: 100.0000 ug | ORAL_TABLET | Freq: Every day | ORAL | Status: DC

## 2013-10-27 NOTE — Telephone Encounter (Signed)
[facility] has the order.  Please call [facility].[phone]  Fax  [phone]  90 day supply   Dr  Synetta Shadow

## 2013-10-27 NOTE — Telephone Encounter (Signed)
Ok'd to change pharmacies. Sent in 90 day supply to [facility].

## 2013-11-12 ENCOUNTER — Telehealth: Payer: Self-pay | Admitting: *Deleted

## 2013-11-12 ENCOUNTER — Ambulatory Visit (AMBULATORY_SURGERY_CENTER): Payer: Self-pay | Admitting: *Deleted

## 2013-11-12 VITALS — Ht 76.0 in | Wt 208.0 lb

## 2013-11-12 DIAGNOSIS — Z1211 Encounter for screening for malignant neoplasm of colon: Secondary | ICD-10-CM

## 2013-11-12 MED ORDER — PREPOPIK 10-3.5-12 MG-GM-GM PO PACK: PACK | ORAL | Status: DC

## 2013-11-12 NOTE — Progress Notes (Signed)
Patient denies any allergies to eggs or soy. Patient denies any problems with anesthesia/sedation. Patient denies any oxygen use at home and does not take any diet/weight loss medications. EMMI education assisgned to patient on colonoscopy, this was explained and instructions given to patient. 

## 2013-11-12 NOTE — Telephone Encounter (Signed)
Noted. Notified patient of Dr.Stark recommendations. No questions from patient at this time.

## 2013-11-12 NOTE — Telephone Encounter (Signed)
Patient is direct for colonoscopy on 12/10/13. Patient was referred by PCP Dr.Lauenstein for hematochezia. Patient states he does have hemorrhoids, occasional bleeding and has had banding several times in the past, last time was 2014 w/Dr.Gross. Patient states having "change in bowel habits over the last year", occasional BM's 2-3 times per day, then back to once per day, also c/o "a lot of gas", denies any abd. pain. Patient denies any family hx colon cancer. Patient does have last colonoscopy report, placed on your desk for review. Colon on 12/06/2004 by Dr.Newman, diverticulosis seen. Is okay for direct colonoscopy at this time? Thanks, Carlos Choi

## 2013-11-12 NOTE — Telephone Encounter (Signed)
Given his symptoms would proceed with direct colonoscopy now.

## 2013-11-26 ENCOUNTER — Encounter: Payer: Self-pay | Admitting: Gastroenterology

## 2013-12-07 ENCOUNTER — Encounter: Payer: Self-pay | Admitting: Family Medicine

## 2013-12-07 DIAGNOSIS — J340 Abscess, furuncle and carbuncle of nose: Secondary | ICD-10-CM

## 2013-12-10 ENCOUNTER — Encounter: Payer: Self-pay | Admitting: Gastroenterology

## 2013-12-10 ENCOUNTER — Ambulatory Visit (AMBULATORY_SURGERY_CENTER): Payer: Medicare Other | Admitting: Gastroenterology

## 2013-12-10 VITALS — BP 128/75 | HR 63 | Temp 96.9°F | Resp 16 | Ht 76.0 in | Wt 208.0 lb

## 2013-12-10 DIAGNOSIS — D126 Benign neoplasm of colon, unspecified: Secondary | ICD-10-CM

## 2013-12-10 DIAGNOSIS — Z1211 Encounter for screening for malignant neoplasm of colon: Secondary | ICD-10-CM

## 2013-12-10 MED ORDER — SODIUM CHLORIDE 0.9 % IV SOLN: 500.0000 mL | INTRAVENOUS | Status: DC

## 2013-12-10 NOTE — Patient Instructions (Addendum)
YOU HAD AN ENDOSCOPIC PROCEDURE TODAY AT [facility]: Refer to the procedure report that was given to you for any specific questions about what was found during the examination.  If the procedure report does not answer your questions, please call your gastroenterologist to clarify.  If you requested that your care partner not be given the details of your procedure findings, then the procedure report has been included in a sealed envelope for you to review at your convenience later.  YOU SHOULD EXPECT: Some feelings of bloating in the abdomen. Passage of more gas than usual.  Walking can help get rid of the air that was put into your GI tract during the procedure and reduce the bloating. If you had a lower endoscopy (such as a colonoscopy or flexible sigmoidoscopy) you may notice spotting of blood in your stool or on the toilet paper. If you underwent a bowel prep for your procedure, then you may not have a normal bowel movement for a few days.  DIET: Your first meal following the procedure should be a light meal and then it is ok to progress to your normal diet.  A half-sandwich or bowl of soup is an example of a good first meal.  Heavy or fried foods are harder to digest and may make you feel nauseous or bloated.  Likewise meals heavy in dairy and vegetables can cause extra gas to form and this can also increase the bloating.  Drink plenty of fluids but you should avoid alcoholic beverages for 24 hours.  Try to increase the fiber in your diet.  ACTIVITY: Your care partner should take you home directly after the procedure.  You should plan to take it easy, moving slowly for the rest of the day.  You can resume normal activity the day after the procedure however you should NOT DRIVE or use heavy machinery for 24 hours (because of the sedation medicines used during the test).    SYMPTOMS TO REPORT IMMEDIATELY: A gastroenterologist can be reached at any hour.  During normal business hours,  8:30 AM to 5:00 PM Monday through Friday, call [phone].  After hours and on weekends, please call the GI answering service at [phone] who will take a message and have the physician on call contact you.  Read the handouts given to you by your recovery room nurse.   Following lower endoscopy (colonoscopy or flexible sigmoidoscopy):  Excessive amounts of blood in the stool  Significant tenderness or worsening of abdominal pains  Swelling of the abdomen that is new, acute  Fever of 100F or higher  FOLLOW UP: If any biopsies were taken you will be contacted by phone or by letter within the next 1-3 weeks.  Call your gastroenterologist if you have not heard about the biopsies in 3 weeks.  Our staff will call the home number listed on your records the next business day following your procedure to check on you and address any questions or concerns that you may have at that time regarding the information given to you following your procedure. This is a courtesy call and so if there is no answer at the home number and we have not heard from you through the emergency physician on call, we will assume that you have returned to your regular daily activities without incident.  SIGNATURES/CONFIDENTIALITY: You and/or your care partner have signed paperwork which will be entered into your electronic medical record.  These signatures attest to the fact that that the  information above on your After Visit Summary has been reviewed and is understood.  Full responsibility of the confidentiality of this discharge information lies with you and/or your care-partner.  Please, read the handouts given to you by your recovery room nurse.

## 2013-12-10 NOTE — Progress Notes (Signed)
A/ox3, pleased with MAC, report to RN 

## 2013-12-10 NOTE — Progress Notes (Signed)
Called to room to assist during endoscopic procedure.  Patient ID and intended procedure confirmed with present staff. Received instructions for my participation in the procedure from the performing physician.  

## 2013-12-10 NOTE — Op Note (Signed)
[place]  [address] [place], [id number]   COLONOSCOPY PROCEDURE REPORT  PATIENT: Carlos Choi, Carlos Choi  MR#: [id number] BIRTHDATE: 01-24-1946 , 68  yrs. old GENDER: Male ENDOSCOPIST: Ladene Artist, MD, [facility] REFERRED HW:TUUE Lauenstein, M.D. PROCEDURE DATE:  12/10/2013 PROCEDURE:   Colonoscopy with snare polypectomy First Screening Colonoscopy - Avg.  risk and is 50 yrs.  old or older - No.  Prior Negative Screening - Now for repeat screening. 10 or more years since last screening  History of Adenoma - Now for follow-up colonoscopy & has been > or = to 3 yrs.  N/A  Polyps Removed Today? Yes. ASA CLASS:   Class II INDICATIONS:average risk screening. MEDICATIONS: MAC sedation, administered by CRNA and propofol (Diprivan) 200mg  IV DESCRIPTION OF PROCEDURE:   After the risks benefits and alternatives of the procedure were thoroughly explained, informed consent was obtained.  A digital rectal exam revealed no abnormalities of the rectum.   The [id number]  endoscope was introduced through the anus and advanced to the cecum, which was identified by both the appendix and ileocecal valve. No adverse events experienced.   The quality of the prep was [place] good The instrument was then slowly withdrawn as the colon was fully examined.  COLON FINDINGS: A sessile polyp measuring 6 mm in size was found in the ascending colon.  A polypectomy was performed with a cold snare.  The resection was complete and the polyp tissue was completely retrieved.   Moderate diverticulosis was noted in the descending colon and sigmoid colon.   The colon was otherwise normal.  There was no diverticulosis, inflammation, polyps or cancers unless previously stated.  Retroflexed views revealed small internal hemorrhoids. The time to cecum=2 minutes 32 seconds. Withdrawal time=10 minutes 03 seconds.  The scope was withdrawn and the procedure completed. COMPLICATIONS: There were no  complications.  ENDOSCOPIC IMPRESSION: 1.   Sessile polyp measuring 6 mm in the ascending colon; polypectomy performed with a cold snare 2.   Moderate diverticulosis in the descending colon and sigmoid colon 3.   Small internal hemorrhoids  RECOMMENDATIONS: 1.  Await pathology results 2.  High fiber diet with liberal fluid intake. 3.  Repeat colonoscopy in 5 years if polyp adenomatous; otherwise 10 years  eSigned:  Ladene Artist, MD, [facility] 12/10/2013 9:43 AM

## 2013-12-13 ENCOUNTER — Telehealth: Payer: Self-pay | Admitting: *Deleted

## 2013-12-13 NOTE — Telephone Encounter (Signed)
  Follow up Call-  Call back number 12/10/2013  Post procedure Call Back phone  # [phone]  Permission to leave phone message Yes     Patient questions:  Do you have a fever, pain , or abdominal swelling? No. Pain Score  0 *  Have you tolerated food without any problems? Yes.    Have you been able to return to your normal activities? Yes.    Do you have any questions about your discharge instructions: Diet   No. Medications  No. Follow up visit  No.  Do you have questions or concerns about your Care? No.  Actions: * If pain score is 4 or above: No action needed, pain <4.  Spoke with wife, patient out of town. He's doing good!

## 2013-12-19 ENCOUNTER — Encounter: Payer: Self-pay | Admitting: Gastroenterology

## 2014-02-17 ENCOUNTER — Ambulatory Visit (INDEPENDENT_AMBULATORY_CARE_PROVIDER_SITE_OTHER): Payer: Medicare Other | Admitting: Internal Medicine

## 2014-02-18 ENCOUNTER — Telehealth (INDEPENDENT_AMBULATORY_CARE_PROVIDER_SITE_OTHER): Payer: Self-pay

## 2014-02-18 DIAGNOSIS — I059 Rheumatic mitral valve disease, unspecified: Secondary | ICD-10-CM

## 2014-02-18 DIAGNOSIS — I369 Nonrheumatic tricuspid valve disorder, unspecified: Secondary | ICD-10-CM

## 2014-02-18 NOTE — Telephone Encounter (Signed)
Phoned Gregory [name] and pt states he does not know if he has been seen by a cardiologist in our office. Provided office number to call for f/u appt after discharge.

## 2014-02-18 NOTE — Telephone Encounter (Signed)
-----   Message from [facility] sent at 02/18/2014 11:22 AM EDT -----  Regarding: Patient admitted to the hospital  Dear Dr. Dub Mikes and Eugene Garnet,     Our missed appointment at [facility] yesterday was cause be Mr. Kimrey's hospitalization for CHF, at [facility]. I informed Mrs. Normington that if she wished Dr. Dub Mikes to consult with his treatment, the attending physician would need to invite him. However, I encouraged her to have her husband recommend his input, if he wished.     Is there a better method to communicate with the patient please let me know. Or alert me if any of these information was not accurate.     Thank you,   Elyse

## 2014-02-20 DIAGNOSIS — I509 Heart failure, unspecified: Secondary | ICD-10-CM

## 2014-03-01 ENCOUNTER — Ambulatory Visit (INDEPENDENT_AMBULATORY_CARE_PROVIDER_SITE_OTHER): Payer: Medicare Other | Admitting: Family Medicine

## 2014-03-01 VITALS — BP 138/72 | HR 76 | Temp 97.9°F | Resp 16 | Ht 76.0 in | Wt 204.8 lb

## 2014-03-01 DIAGNOSIS — J029 Acute pharyngitis, unspecified: Secondary | ICD-10-CM

## 2014-03-01 DIAGNOSIS — K649 Unspecified hemorrhoids: Secondary | ICD-10-CM

## 2014-03-01 DIAGNOSIS — K640 First degree hemorrhoids: Secondary | ICD-10-CM

## 2014-03-01 DIAGNOSIS — Z86010 Personal history of colonic polyps: Secondary | ICD-10-CM

## 2014-03-01 LAB — POCT CBC
Granulocyte percent: 60.7 %G (ref 37–80)
HCT, POC: 52.2 % (ref 43.5–53.7)
Hemoglobin: 17.2 g/dL (ref 14.1–18.1)
Lymph, poc: 1.8 (ref 0.6–3.4)
MCH, POC: 30.4 pg (ref 27–31.2)
MCHC: 33 g/dL (ref 31.8–35.4)
MCV: 92 fL (ref 80–97)
MID (cbc): 0.5 (ref 0–0.9)
MPV: 8.1 fL (ref 0–99.8)
POC Granulocyte: 3.6 (ref 2–6.9)
POC LYMPH PERCENT: 30.1 %L (ref 10–50)
POC MID %: 9.2 %M (ref 0–12)
Platelet Count, POC: 172 10*3/uL (ref 142–424)
RBC: 5.68 M/uL (ref 4.69–6.13)
RDW, POC: 13.3 %
WBC: 5.9 10*3/uL (ref 4.6–10.2)

## 2014-03-01 LAB — POCT RAPID STREP A (OFFICE): Rapid Strep A Screen: NEGATIVE

## 2014-03-01 NOTE — Patient Instructions (Addendum)
PSA 2.22    (1.96 one year ago)  For PSA values from 2.5-4.0, particularly in younger men <66 years old, the AUA and NCCN suggest testing for % Free PSA (3515) and evaluation of the rate of increase in PSA (PSA velocity). Resulting Agency [facility]   Stop the stool softener.  I think today's illness is a viral 1. Blood count looks good and the strep test is negative. If the symptoms persist I would call me in the next 2448 hours.

## 2014-03-01 NOTE — Progress Notes (Signed)
Patient ID: Carlos Choi MRN: [id number], DOB: 1945/09/15, 68 y.o. Date of Encounter: 03/01/2014, 4:37 PM  Primary Physician: Robyn Haber, MD  Chief Complaint:  Chief Complaint  Patient presents with  . Fever    x5-6 days;  pt states he has been having a low-grade fever  . Sore Throat    x5-6 days  . Chills    HPI: 68 y.o. year old male presents with 5 day history of sore throat. Subjective fever and chills. No cough, congestion, rhinorrhea, sinus pressure, otalgia, or headache. Normal hearing. No GI complaints. Able to swallow saliva, but hurts to do so. Decreased appetite secondary to sore throat.  He's had some evening low grade fevers and some neck discomfort as well  Colonoscopy done 6 weeks ago.  He continues to suffer from hemorrhoids. He's had them banded twice and still spotting is irritated. He's taking a stool softener and he thinks this may be contributing to his problem since he does eat quite a bit of fruit. Needs last PSA value  Past Medical History  Diagnosis Date  . Hemorrhoids   . Rectal bleeding     due to hemorrhoid   . Hypertension   . Hypothyroid      Home Meds: Prior to Admission medications   Medication Sig Start Date End Date Taking? Authorizing Provider  aspirin 81 MG tablet Take 81 mg by mouth every other day.     Yes Historical Provider, MD  docusate sodium (COLACE) 100 MG capsule Take 100 mg by mouth daily.   Yes Historical Provider, MD  Lactobacillus Rhamnosus, GG, (CULTURELLE PO) Take 1 tablet by mouth daily.   Yes Historical Provider, MD  levothyroxine (SYNTHROID, LEVOTHROID) 100 MCG tablet Take 1 tablet (100 mcg total) by mouth daily. 10/19/13  Yes Robyn Haber, MD  loratadine (CLARITIN) 10 MG tablet Take 10 mg by mouth daily.   Yes Historical Provider, MD  Multiple Vitamins-Minerals (CENTRUM SILVER PO) Take by mouth daily.     Yes Historical Provider, MD  Omega-3 Fatty Acids (FISH OIL) 1000 MG CAPS Take by mouth 3 (three) times  daily.     Yes Historical Provider, MD    Allergies: No Known Allergies  History   Social History  . Marital Status: Married    Spouse Name: N/A    Number of Children: N/A  . Years of Education: N/A   Occupational History  . Not on file.   Social History Main Topics  . Smoking status: Former Smoker    Quit date: 06/13/1971  . Smokeless tobacco: Never Used  . Alcohol Use: Yes     Comment: 2 beers Monthly.  . Drug Use: No  . Sexual Activity: Not on file   Other Topics Concern  . Not on file   Social History Narrative  . No narrative on file     Review of Systems: Constitutional: negative for weight changes HEENT: see above Cardiovascular: negative for chest pain or palpitations Respiratory: negative for hemoptysis, wheezing, or shortness of breath Abdominal: negative for abdominal pain, nausea, vomiting or diarrhea Dermatological: negative for rash Neurologic: negative for headache   Physical Exam: Blood pressure 138/72, pulse 76, temperature 97.9 F (36.6 C), temperature source Oral, resp. rate 16, height 6\' 4"  (1.93 m), weight 204 lb 12.8 oz (92.897 kg), SpO2 96.00%., Body mass index is 24.94 kg/(m^2). General: Well developed, well nourished, in no acute distress. Head: Normocephalic, atraumatic, eyes without discharge, sclera non-icteric, nares are patent. Bilateral auditory  canals clear, TM's are without perforation, pearly grey with reflective cone of light bilaterally. No sinus TTP. Oral cavity moist, dentition normal. Posterior pharynx with post nasal drip and mild erythema. No peritonsillar abscess or tonsillar exudate. Neck: Supple. No thyromegaly. Full ROM. No lymphadenopathy. Lungs: Clear bilaterally to auscultation without wheezes, rales, or rhonchi. Breathing is unlabored. Heart: RRR with S1 S2. No murmurs, rubs, or gallops appreciated. Abdomen: Soft, non-tender, non-distended with normoactive bowel sounds. No hepatomegaly. No rebound/guarding. No obvious  abdominal masses. Msk:  Strength and tone normal for age. Extremities: No clubbing or cyanosis. No edema. Neuro: Alert and oriented X 3. Moves all extremities spontaneously. CNII-XII grossly in tact. Psych:  Responds to questions appropriately with a normal affect.   Results for orders placed in visit on 03/01/14  POCT CBC      Result Value Ref Range   WBC 5.9  4.6 - 10.2 K/uL   Lymph, poc 1.8  0.6 - 3.4   POC LYMPH PERCENT 30.1  10 - 50 %L   MID (cbc) 0.5  0 - 0.9   POC MID % 9.2  0 - 12 %M   POC Granulocyte 3.6  2 - 6.9   Granulocyte percent 60.7  37 - 80 %G   RBC 5.68  4.69 - 6.13 M/uL   Hemoglobin 17.2  14.1 - 18.1 g/dL   HCT, POC 52.2  43.5 - 53.7 %   MCV 92.0  80 - 97 fL   MCH, POC 30.4  27 - 31.2 pg   MCHC 33.0  31.8 - 35.4 g/dL   RDW, POC 13.3     Platelet Count, POC 172  142 - 424 K/uL   MPV 8.1  0 - 99.8 fL  POCT RAPID STREP A (OFFICE)      Result Value Ref Range   Rapid Strep A Screen Negative  Negative    ASSESSMENT AND PLAN:  68 y.o. year old male with acute pharyngitis, persistent hemorrhoids and recent colon polyp. - -Tylenol/Motrin prn -Rest/fluids -RTC precautions -RTC 3-5 days if no improvement  Signed, Robyn Haber, MD 03/01/2014 4:37 PM

## 2014-03-18 ENCOUNTER — Encounter (INDEPENDENT_AMBULATORY_CARE_PROVIDER_SITE_OTHER): Payer: Self-pay | Admitting: Internal Medicine

## 2014-03-18 ENCOUNTER — Ambulatory Visit (INDEPENDENT_AMBULATORY_CARE_PROVIDER_SITE_OTHER): Payer: Medicare Other | Admitting: Internal Medicine

## 2014-03-18 VITALS — BP 118/71 | HR 109 | Resp 16 | Ht 66.0 in | Wt 157.0 lb

## 2014-03-18 DIAGNOSIS — I5040 Unspecified combined systolic (congestive) and diastolic (congestive) heart failure: Secondary | ICD-10-CM

## 2014-03-18 DIAGNOSIS — I5020 Unspecified systolic (congestive) heart failure: Secondary | ICD-10-CM

## 2014-03-18 DIAGNOSIS — I5043 Acute on chronic combined systolic (congestive) and diastolic (congestive) heart failure: Secondary | ICD-10-CM | POA: Insufficient documentation

## 2014-03-18 MED ORDER — BUMETANIDE 1 MG PO TABS
1.0000 mg | ORAL_TABLET | Freq: Every day | ORAL | Status: DC
Start: 2014-03-18 — End: 2014-04-22

## 2014-03-18 NOTE — Progress Notes (Signed)
[facility] OFF[facility] VISIT          I had the pleasure of seeing Gregory [name] today for cardiovascular follow up. He is a pleasant 68 y.o. male with a history of Heart Failure. who presents for continued management. [name] He presented with leg swelling and shortness of breath.  He underwent a cardiac cath at [facility] and was found to have an ejection fraction of 35-40%.      MEDICATIONS:     Current Outpatient Prescriptions   Medication Sig Dispense Refill   . aspirin EC 325 MG EC tablet Take 1 tablet (325 mg total) by mouth every 12 (twelve) hours. 30 tablet 0   . atorvastatin (LIPITOR) 20 MG tablet Take 20 mg by mouth daily.     0   . furosemide (LASIX) 20 MG tablet Take 20 mg by mouth daily.     [name] KLOR-CON M 10 MEQ tablet   0   . lisinopril (PRINIVIL,ZESTRIL) 10 MG tablet Take 10 mg by mouth daily.     0   . metoprolol (LOPRESSOR) 25 MG tablet Take 25 mg by mouth 2 (two) times daily.     5   . spironolactone (ALDACTONE) 25 MG tablet Take 25 mg by mouth daily. Take 1/2 tablet 0.5 mg by mouth daily    0   . bumetanide (BUMEX) 1 MG tablet Take 1 tablet (1 mg total) by mouth daily. 90 tablet 3     No current facility-administered medications for this visit.       REVIEW OF SYSTEMS: All other systems reviewed and negative except as above.    PHYSICAL EXAMINATION  Vital Signs: BP 118/71 mmHg  Pulse 109  Resp 16  Ht 1.676 m (_0 )  Wt 71.215 kg (157 lb)  BMI 25.35 kg/m2   Vital signs reviewed    Wt Readings from Last 3 Encounters:   03/18/14 71.215 kg (157 lb)   06/09/13 77.111 kg (170 lb)   12/16/12 72.576 kg (160 lb)        General Appearance:  A well-appearing male in no acute distress.    HEENT: Sclera anicteric, conjunctiva without pallor, moist mucous membranes, normal dentition.   Neck:  Supple without jugular venous distention.  Normal carotid upstrokes without bruits.   Chest: Clear to auscultation bilaterally with good air movement and respiratory effort and no wheezes, rales, or rhonchi   Cardiac:  RRR.  Normal S1 and physiologically split S2, without gallops or rub. No murmurs.  PMI of normal size and nondisplaced.   Vascular:  2+ carotid, radial, and distal pulses bilaterally  Abdomen: Soft, nontender, nondistended, with normoactive bowel sounds.  No pulsatile masses, or bruits.   Extremities: Warm without edema, clubbing, or cyanosis.   Skin: No rash, warm, appropriate for race.   Neuro: Alert and oriented x3. Grossly intact.  CN II-XII intact.  Normal mood and affect.     ECG:   Independent review shows: Sinus Tachycardia    ASSESSMENT/PLAN:    1. Congestive Heart Failure ( non ischemic)           Mixed ischemic and primary cardiomyopathy  Plan    Continue current medecation  Change to Bumex 1 mg  RTC  2 weeks          Karle Starch, MD  03/18/2014

## 2014-03-21 ENCOUNTER — Encounter (INDEPENDENT_AMBULATORY_CARE_PROVIDER_SITE_OTHER): Payer: Self-pay | Admitting: Internal Medicine

## 2014-03-25 ENCOUNTER — Encounter (INDEPENDENT_AMBULATORY_CARE_PROVIDER_SITE_OTHER): Payer: Medicare Other | Admitting: Surgery

## 2014-04-01 ENCOUNTER — Encounter (INDEPENDENT_AMBULATORY_CARE_PROVIDER_SITE_OTHER): Payer: Self-pay | Admitting: Internal Medicine

## 2014-04-01 ENCOUNTER — Ambulatory Visit (INDEPENDENT_AMBULATORY_CARE_PROVIDER_SITE_OTHER): Payer: Medicare Other | Admitting: Internal Medicine

## 2014-04-01 VITALS — BP 98/65 | HR 97 | Resp 16 | Ht 66.0 in | Wt 158.0 lb

## 2014-04-01 DIAGNOSIS — I429 Cardiomyopathy, unspecified: Secondary | ICD-10-CM

## 2014-04-01 NOTE — Progress Notes (Signed)
[facility] VISIT      Chief Complaint   Patient presents with   . Congestive Heart Failure       I had the pleasure of seeing [name] today for cardiovascular follow up. He is a pleasant 68 y.o. male with a history of  non ischemic cardiomyiopathy ( EF 35-45%) who presents for continued management.  Good appetite but " losing weight".  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 Taking all of his medications at night.      MEDICATIONS:     Current Outpatient Prescriptions   Medication Sig Dispense Refill   . aspirin EC 325 MG EC tablet Take 1 tablet (325 mg total) by mouth every 12 (twelve) hours. 30 tablet 0   . atorvastatin (LIPITOR) 20 MG tablet Take 20 mg by mouth daily.     0   . bumetanide (BUMEX) 1 MG tablet Take 1 tablet (1 mg total) by mouth daily. 90 tablet 3   . furosemide (LASIX) 20 MG tablet Take 20 mg by mouth daily.     [name] KLOR-CON M 10 MEQ tablet   0   . lisinopril (PRINIVIL,ZESTRIL) 10 MG tablet Take 10 mg by mouth daily.     0   . metoprolol (LOPRESSOR) 25 MG tablet Take 25 mg by mouth 2 (two) times daily.     5   . spironolactone (ALDACTONE) 25 MG tablet Take 25 mg by mouth daily. Take 1/2 tablet 0.5 mg by mouth daily    0     No current facility-administered medications for this visit.       REVIEW OF SYSTEMS: All other systems reviewed and negative except as above.    PHYSICAL EXAMINATION  Vital Signs: BP 98/65 mmHg  Pulse 97  Resp 16  Ht 1.676 m (_0 )  Wt 71.668 kg (158 lb)  BMI 25.51 kg/m2   Vital signs reviewed    Wt Readings from Last 3 Encounters:   04/01/14 71.668 kg (158 lb)   03/18/14 71.215 kg (157 lb)   06/09/13 77.111 kg (170 lb)        General Appearance:  A well-appearing male in no acute distress.    HEENT: Sclera anicteric, conjunctiva without pallor, moist mucous membranes, normal dentition.   Neck:  Supple without jugular venous distention.  Normal carotid upstrokes without bruits.   Chest: Clear to auscultation bilaterally with good air movement and respiratory  effort and no wheezes, rales, or rhonchi   Cardiac: RRR.  Normal S1 and physiologically split S2, without gallops or rub. No murmurs.  PMI of normal size and nondisplaced.   Vascular:  2+ carotid, radial, and distal pulses bilaterally  Abdomen: Soft, nontender, nondistended, with normoactive bowel sounds.  No pulsatile masses, or bruits.   Extremities: Warm without edema, clubbing, or cyanosis.   Skin: No rash, warm, appropriate for race.   Neuro: Alert and oriented x3. Grossly intact.  CN II-XII intact.  Normal mood and affect.     ECG:   Independent review shows: nsr and non specific st    ASSESSMENT/PLAN:    1. Non Ischemic Cardiomyopathy  2. Asthma     Plan    Continue current medications  RTC 2 months      Karle Starch, MD  04/01/2014

## 2014-04-06 ENCOUNTER — Ambulatory Visit (INDEPENDENT_AMBULATORY_CARE_PROVIDER_SITE_OTHER): Payer: Medicare Other | Admitting: *Deleted

## 2014-04-06 ENCOUNTER — Other Ambulatory Visit (INDEPENDENT_AMBULATORY_CARE_PROVIDER_SITE_OTHER): Payer: Self-pay

## 2014-04-06 ENCOUNTER — Encounter (INDEPENDENT_AMBULATORY_CARE_PROVIDER_SITE_OTHER): Payer: Self-pay | Admitting: Internal Medicine

## 2014-04-06 DIAGNOSIS — Z23 Encounter for immunization: Secondary | ICD-10-CM

## 2014-04-06 DIAGNOSIS — E7800 Pure hypercholesterolemia, unspecified: Secondary | ICD-10-CM

## 2014-04-06 DIAGNOSIS — I10 Essential (primary) hypertension: Secondary | ICD-10-CM

## 2014-04-06 MED ORDER — LISINOPRIL 10 MG PO TABS
10.0000 mg | ORAL_TABLET | Freq: Every day | ORAL | Status: DC
Start: 2014-04-06 — End: 2014-07-29

## 2014-04-06 MED ORDER — ATORVASTATIN CALCIUM 20 MG PO TABS
20.0000 mg | ORAL_TABLET | Freq: Every day | ORAL | Status: DC
Start: 2014-04-06 — End: 2014-07-29

## 2014-04-07 ENCOUNTER — Other Ambulatory Visit (INDEPENDENT_AMBULATORY_CARE_PROVIDER_SITE_OTHER): Payer: Self-pay

## 2014-04-07 DIAGNOSIS — I10 Essential (primary) hypertension: Secondary | ICD-10-CM

## 2014-04-07 MED ORDER — POTASSIUM CHLORIDE CRYS ER 10 MEQ PO TBCR
10.0000 meq | EXTENDED_RELEASE_TABLET | Freq: Every day | ORAL | Status: DC
Start: 2014-04-07 — End: 2014-07-29

## 2014-04-07 MED ORDER — FUROSEMIDE 20 MG PO TABS
20.0000 mg | ORAL_TABLET | Freq: Every day | ORAL | Status: DC
Start: 2014-04-07 — End: 2014-07-29

## 2014-04-13 DIAGNOSIS — M5020 Other cervical disc displacement, unspecified cervical region: Secondary | ICD-10-CM | POA: Diagnosis not present

## 2014-04-13 DIAGNOSIS — K4090 Unilateral inguinal hernia, without obstruction or gangrene, not specified as recurrent: Secondary | ICD-10-CM | POA: Diagnosis not present

## 2014-04-13 DIAGNOSIS — N529 Male erectile dysfunction, unspecified: Secondary | ICD-10-CM | POA: Diagnosis not present

## 2014-04-13 DIAGNOSIS — K219 Gastro-esophageal reflux disease without esophagitis: Secondary | ICD-10-CM | POA: Diagnosis not present

## 2014-04-13 DIAGNOSIS — N433 Hydrocele, unspecified: Secondary | ICD-10-CM | POA: Diagnosis not present

## 2014-04-13 DIAGNOSIS — E785 Hyperlipidemia, unspecified: Secondary | ICD-10-CM | POA: Diagnosis not present

## 2014-04-13 DIAGNOSIS — I10 Essential (primary) hypertension: Secondary | ICD-10-CM | POA: Diagnosis not present

## 2014-04-13 DIAGNOSIS — Z23 Encounter for immunization: Secondary | ICD-10-CM | POA: Diagnosis not present

## 2014-04-14 ENCOUNTER — Other Ambulatory Visit (INDEPENDENT_AMBULATORY_CARE_PROVIDER_SITE_OTHER): Payer: Self-pay | Admitting: Surgery

## 2014-04-14 ENCOUNTER — Encounter (INDEPENDENT_AMBULATORY_CARE_PROVIDER_SITE_OTHER): Payer: Self-pay | Admitting: Surgery

## 2014-04-14 DIAGNOSIS — D126 Benign neoplasm of colon, unspecified: Secondary | ICD-10-CM | POA: Insufficient documentation

## 2014-04-18 NOTE — H&P (Addendum)
Carlos Choi  Location: [facility] Patient #: [id number] DOB: Jun 24, 1945 Married / Language: Undefined / Race: Undefined Male  History of Present Illness Carlos Hector MD; 03/25/2014 12:33 PM) Patient words: hemorrhoids.  The patient is a 69 year old male who presents with hemorrhoids. This patient is a 68 y.o.male who presents today for surgical evaluation at the request of self. Reason for visit: Recurrent hemorrhoid bleeding Pleasant active male. 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 Began to get hemorrhoid problems. Occasional bleeding. Hard to keep the perianal skin clean after bowel movements. Some itching and irritation. Was noted to have a partially prolapsing hemorrhoid on initial visit with [place] in 2012. It was banded. Recurrent symptoms Mar2014. It was injected. Patient came in with recurrent hemorrhoid symptoms. I banded them later 2014. He him comes with some bleeding. Not severe but annoying. He wondered if something needed to be done. He is getting ready to go on a vacation cruise and worries that the hemorrhoids will get worse on that. Wished to be seen to have enough time to recovering before his vacation. He normally has a bowel movement every day to every other day. He does not take a fiber supplement. He recalls having a normal colonoscopy in 2007. He cannot recall who was with but promises to bring those records back to [place]. No personal nor family history of GI/colon cancer, inflammatory bowel disease, irritable bowel syndrome, allergy such as Celiac Sprue, dietary/dairy problems, colitis, ulcers nor gastritis. No recent sick contacts/gastroenteritis. No travel outside the country. No changes in diet.   Other Problems [facility] [place], [place]; 03/25/2014 11:56 AM) Enlarged Prostate Hemorrhoids  Past Surgical History [facility] [place], [place]; 03/25/2014 11:56 AM) Hernia Repair Bilateral. Rotator Cuff Repair - Both  Diagnostic  Studies History ([place], [place]; 03/25/2014 11:56 AM) Colonoscopy within last year  Allergies [facility], [place]; 03/25/2014 11:56 AM) No Known Drug Allergies10/02/2014  Medication History ([place], RMA; 03/25/2014 11:58 AM) Aspirin (81MG  Tablet, 1 (one) Oral) Active. Colace Clear (Oral) Specific dose unknown - Active. Culturelle (Oral) Active. Levothyroxine Sodium (100MCG Tablet, Oral) Active. Claritin (10MG  Capsule, Oral) Active. Centrum Silver (Oral) Active. Omega-3 Fatty Acids (1000MG  Capsule, Oral) Active.  Social History [facility] [place], [place]; 03/25/2014 11:56 AM) Alcohol use Occasional alcohol use. Caffeine use Carbonated beverages, Tea. No drug use Tobacco use Former smoker.  Family History Alexander Bergeron [place], [place]; 03/25/2014 11:56 AM) Hypertension Brother. Ovarian Cancer Sister.  Review of Systems ([place] RMA; 03/25/2014 11:56 AM) General Not Present- Appetite Loss, Chills, Fatigue, Fever, Night Sweats, Weight Gain and Weight Loss. Skin Not Present- Change in Wart/Mole, Dryness, Hives, Jaundice, New Lesions, Non-Healing Wounds, Rash and Ulcer. HEENT Present- Seasonal Allergies. Not Present- Earache, Hearing Loss, Hoarseness, Nose Bleed, Oral Ulcers, Ringing in the Ears, Sinus Pain, Sore Throat, Visual Disturbances, Wears glasses/contact lenses and Yellow Eyes. Respiratory Present- Snoring. Not Present- Bloody sputum, Chronic Cough, Difficulty Breathing and Wheezing. Breast Not Present- Breast Mass, Breast Pain, Nipple Discharge and Skin Changes. Cardiovascular Not Present- Chest Pain, Difficulty Breathing Lying Down, Leg Cramps, Palpitations, Rapid Heart Rate, Shortness of Breath and Swelling of Extremities. Gastrointestinal Present- Hemorrhoids and Indigestion. Not Present- Abdominal Pain, Bloating, Bloody Stool, Change in Bowel Habits, Chronic diarrhea, Constipation, Difficulty Swallowing, Excessive gas,  Gets full quickly at meals, Nausea, Rectal Pain and Vomiting. Male Genitourinary Not Present- Blood in Urine, Change in Urinary Stream, Frequency, Impotence, Nocturia, Painful Urination, Urgency and Urine Leakage. Musculoskeletal Not Present- Back Pain, Joint Pain,  Joint Stiffness, Muscle Pain, Muscle Weakness and Swelling of Extremities. Neurological Not Present- Decreased Memory, Fainting, Headaches, Numbness, Seizures, Tingling, Tremor, Trouble walking and Weakness. Psychiatric Not Present- Anxiety, Bipolar, Change in Sleep Pattern, Depression, Fearful and Frequent crying. Endocrine Not Present- Cold Intolerance, Excessive Hunger, Hair Changes, Heat Intolerance, Hot flashes and New Diabetes. Hematology Present- Easy Bruising. Not Present- Excessive bleeding, Gland problems, HIV and Persistent Infections.   Vitals ([place]ah[place]ah Maldonado RMA; 03/25/2014 12:00 PM) 03/25/2014 11:59 AM Weight: 205.6 lb Height: 76in Body Surface Area: 2.24 m Body Mass Index: 25.03 kg/m Temp.: 97.30F(Oral)  Pulse: 72 (Regular)  P.OX: 97% (Room air) BP: 122/70 (Sitting, Left Arm, Standard)    Physical Exam Carlos Hector MD; 03/25/2014 2:31 PM) General Mental Status-Alert. General Appearance-Not in acute distress, Not Sickly. Orientation-Oriented X3. Hydration-Well hydrated. Voice-Normal.  Integumentary Global Assessment Upon inspection and palpation of skin surfaces of the - Axillae: non-tender, no inflammation or ulceration, no drainage. and Distribution of scalp and body hair is normal. General Characteristics Temperature - normal warmth is noted.  Head and Neck Head-normocephalic, atraumatic with no lesions or palpable masses. Face Global Assessment - atraumatic, no absence of expression. Neck Global Assessment - no abnormal movements, no bruit auscultated on the right, no bruit auscultated on the left, no decreased range of motion,  non-tender. Trachea-midline. Thyroid Gland Characteristics - non-tender.  Eye Eyeball - Left-Extraocular movements intact, No Nystagmus. Eyeball - Right-Extraocular movements intact, No Nystagmus. Cornea - Left-No Hazy. Cornea - Right-No Hazy. Sclera/Conjunctiva - Left-No scleral icterus, No Discharge. Sclera/Conjunctiva - Right-No scleral icterus, No Discharge. Pupil - Left-Direct reaction to light normal. Pupil - Right-Direct reaction to light normal.  ENMT Ears Pinna - Left - no drainage observed, no generalized tenderness observed. Right - no drainage observed, no generalized tenderness observed. Nose and Sinuses External Inspection of the Nose - no destructive lesion observed. Inspection of the nares - Left - quiet respiration. Right - quiet respiration. Mouth and Throat Lips - Upper Lip - no fissures observed, no pallor noted. Lower Lip - no fissures observed, no pallor noted. Nasopharynx - no discharge present. Oral Cavity/Oropharynx - Tongue - no dryness observed. Oral Mucosa - no cyanosis observed. Hypopharynx - no evidence of airway distress observed.  Chest and Lung Exam Inspection Movements - Normal and Symmetrical. Accessory muscles - No use of accessory muscles in breathing. Palpation Palpation of the chest reveals - Non-tender. Auscultation Breath sounds - Normal and Clear.  Cardiovascular Auscultation Rhythm - Regular. Murmurs & Other Heart Sounds - Auscultation of the heart reveals - No Murmurs and No Systolic Clicks.  Abdomen Inspection Inspection of the abdomen reveals - No Visible peristalsis and No Abnormal pulsations. Umbilicus - No Bleeding, No Urine drainage. Palpation/Percussion Palpation and Percussion of the abdomen reveal - Soft, Non Tender, No Rebound tenderness, No Rigidity (guarding) and No Cutaneous hyperesthesia.  Male Genitourinary Sexual Maturity Tanner 5 - Adult hair pattern and Adult penile size and  shape.  Rectal Anorectal Exam External - rash noted on exam, Non-tender, no anal fissures, no anorectal fistula, no skin tags. Internal - internal hemorrhoids, no anal strictures, no rectal wall edema, no lacerations, no polyps observed. Residue - yellow and mucoid. Note: Partially prolapsed right posterior hemorrhoid. Moderately large. Moderate right anterior greater than left lateral hemorrhoidal piles. Enlarged redundant rectal mucosa but no true rectal prolapse.   Peripheral Vascular Upper Extremity Inspection - Left - No Cyanotic nailbeds, Not Ischemic. Right - No Cyanotic nailbeds, Not Ischemic.  Neurologic Neurologic evaluation reveals -  normal attention span and ability to concentrate, able to name objects and repeat phrases. Appropriate fund of knowledge , normal sensation and normal coordination. Mental Status Affect - not angry, not paranoid. Cranial Nerves-Normal Bilaterally. Gait-Normal.  Neuropsychiatric Mental status exam performed with findings of-able to articulate well with normal speech/language, rate, volume and coherence, thought content normal with ability to perform basic computations and apply abstract reasoning and no evidence of hallucinations, delusions, obsessions or homicidal/suicidal ideation.  Musculoskeletal Global Assessment Spine, Ribs and Pelvis - no instability, subluxation or laxity. Right Upper Extremity - no instability, subluxation or laxity.  Lymphatic Head & Neck  General Head & Neck Lymphatics: Bilateral - Description - No Localized lymphadenopathy. Axillary  General Axillary Region: Bilateral - Description - No Localized lymphadenopathy. Femoral & Inguinal  Generalized Femoral & Inguinal Lymphatics: Left - Description - No Localized lymphadenopathy. Right - Description - No Localized lymphadenopathy.    Assessment & Plan Carlos Hector MD; 03/25/2014 2:35 PM) PROLAPSED INTERNAL HEMORRHOIDS, GRADE 3 (455.2   K64.8) Impression: I am concerned that the right posterior hemorrhoid is partially prolapsed and may not respond to banding again. This would be the 4th intervantion in 3 years. He wished to try one more time. If not, plan [facility] hemorrhoidal ligation/pexy as outpatient procedure.  The anatomy & physiology of the anorectal region was discussed. The pathophysiology of hemorrhoids and differential diagnosis was discussed. Natural history progression was discussed. I stressed the importance of a bowel regimen to have daily soft bowel movements to minimize progression of disease.  [name] symptoms are not adequately controlled. Therefore, I recommended banding to treat the hemorrhoids. I went over the technique, risks, benefits, and alternatives. Goals of post-operative recovery were discussed as well. Questions were answered. The patient expressed understanding & wished to proceed.  The patient was positioned in the lateral decubitus position. Perianal & rectal examination was done. Using anoscopy, I ligated the hemorrhoids above the dentate line with banding (Rant/post & L posterior). The patient tolerated the procedure well. Educational handouts further explaining the pathology, treatment options, and bowel regimen were given as well. Current Plans  Pt Education - [facility] Hemorrhoids (Leiya Keesey) Pt Education - [facility] Rectal Surgery HCI (Raunak Antuna): discussed with patient and provided information. Pt Education - [facility] Pain Control ([place] Morren) HEMORRHOIDECTOMY, INTERNAL, RUBBER BAND LIGATION ([id number])   Signed by Carlos Hector, MD

## 2014-04-21 ENCOUNTER — Observation Stay
Admission: EM | Admit: 2014-04-21 | Discharge: 2014-04-22 | Disposition: A | Discharge status: Home / Self Care | Payer: Medicare Other | Attending: Pulmonary Disease | Admitting: Pulmonary Disease

## 2014-04-21 ENCOUNTER — Emergency Department: Payer: Medicare Other

## 2014-04-21 ENCOUNTER — Observation Stay: Payer: Medicare Other | Admitting: Pulmonary Disease

## 2014-04-21 DIAGNOSIS — N178 Other acute kidney failure: Secondary | ICD-10-CM | POA: Insufficient documentation

## 2014-04-21 DIAGNOSIS — R7989 Other specified abnormal findings of blood chemistry: Secondary | ICD-10-CM

## 2014-04-21 DIAGNOSIS — I129 Hypertensive chronic kidney disease with stage 1 through stage 4 chronic kidney disease, or unspecified chronic kidney disease: Secondary | ICD-10-CM | POA: Insufficient documentation

## 2014-04-21 DIAGNOSIS — Z23 Encounter for immunization: Secondary | ICD-10-CM | POA: Insufficient documentation

## 2014-04-21 DIAGNOSIS — R748 Abnormal levels of other serum enzymes: Secondary | ICD-10-CM | POA: Insufficient documentation

## 2014-04-21 DIAGNOSIS — Z7982 Long term (current) use of aspirin: Secondary | ICD-10-CM | POA: Insufficient documentation

## 2014-04-21 DIAGNOSIS — N189 Chronic kidney disease, unspecified: Secondary | ICD-10-CM | POA: Insufficient documentation

## 2014-04-21 DIAGNOSIS — R42 Dizziness and giddiness: Secondary | ICD-10-CM

## 2014-04-21 DIAGNOSIS — E860 Dehydration: Principal | ICD-10-CM | POA: Insufficient documentation

## 2014-04-21 DIAGNOSIS — M19011 Primary osteoarthritis, right shoulder: Secondary | ICD-10-CM | POA: Insufficient documentation

## 2014-04-21 DIAGNOSIS — R778 Other specified abnormalities of plasma proteins: Secondary | ICD-10-CM | POA: Diagnosis present

## 2014-04-21 DIAGNOSIS — R55 Syncope and collapse: Secondary | ICD-10-CM

## 2014-04-21 DIAGNOSIS — I429 Cardiomyopathy, unspecified: Secondary | ICD-10-CM | POA: Insufficient documentation

## 2014-04-21 DIAGNOSIS — I10 Essential (primary) hypertension: Secondary | ICD-10-CM

## 2014-04-21 DIAGNOSIS — I5040 Unspecified combined systolic (congestive) and diastolic (congestive) heart failure: Secondary | ICD-10-CM | POA: Insufficient documentation

## 2014-04-21 DIAGNOSIS — M19012 Primary osteoarthritis, left shoulder: Secondary | ICD-10-CM | POA: Insufficient documentation

## 2014-04-21 DIAGNOSIS — J45909 Unspecified asthma, uncomplicated: Secondary | ICD-10-CM | POA: Insufficient documentation

## 2014-04-21 DIAGNOSIS — I951 Orthostatic hypotension: Secondary | ICD-10-CM | POA: Insufficient documentation

## 2014-04-21 HISTORY — DX: Heart failure, unspecified: I50.9

## 2014-04-21 LAB — CBC AND DIFFERENTIAL
Basophils Absolute Automated: 0.01 10*3/uL (ref 0.00–0.20)
Basophils Automated: 0 %
Eosinophils Absolute Automated: 0.15 10*3/uL (ref 0.00–0.70)
Eosinophils Automated: 3 %
Hematocrit: 30.7 % — ABNORMAL LOW (ref 42.0–52.0)
Hgb: 10.3 g/dL — ABNORMAL LOW (ref 13.0–17.0)
Lymphocytes Absolute Automated: 1.54 10*3/uL (ref 0.50–4.40)
Lymphocytes Automated: 33 %
MCH: 33.6 pg — ABNORMAL HIGH (ref 28.0–32.0)
MCHC: 33.6 g/dL (ref 32.0–36.0)
MCV: 100 fL (ref 80.0–100.0)
MPV: 11.1 fL (ref 9.4–12.3)
Monocytes Absolute Automated: 0.3 10*3/uL (ref 0.00–1.20)
Monocytes: 6 %
Neutrophils Absolute: 2.73 10*3/uL (ref 1.80–8.10)
Neutrophils: 58 %
Platelets: 161 10*3/uL (ref 140–400)
RBC: 3.07 10*6/uL — ABNORMAL LOW (ref 4.70–6.00)
RDW: 14 % (ref 12–15)
WBC: 4.73 10*3/uL (ref 3.50–10.80)

## 2014-04-21 LAB — COMPREHENSIVE METABOLIC PANEL
ALT: 42 U/L (ref 0–55)
AST (SGOT): 38 U/L — ABNORMAL HIGH (ref 5–34)
Albumin/Globulin Ratio: 1.5 (ref 0.9–2.2)
Albumin: 3.9 g/dL (ref 3.5–5.0)
Alkaline Phosphatase: 97 U/L (ref 38–106)
Anion Gap: 10 (ref 5.0–15.0)
BUN: 36 mg/dL — ABNORMAL HIGH (ref 9.0–28.0)
Bilirubin, Total: 1.8 mg/dL — ABNORMAL HIGH (ref 0.2–1.2)
CO2: 26 mEq/L (ref 22–29)
Calcium: 9.6 mg/dL (ref 8.5–10.5)
Chloride: 105 mEq/L (ref 100–111)
Creatinine: 2 mg/dL — ABNORMAL HIGH (ref 0.7–1.3)
Globulin: 2.6 g/dL (ref 2.0–3.6)
Glucose: 106 mg/dL — ABNORMAL HIGH (ref 70–100)
Potassium: 4.5 mEq/L (ref 3.5–5.1)
Protein, Total: 6.5 g/dL (ref 6.0–8.3)
Sodium: 141 mEq/L (ref 136–145)

## 2014-04-21 LAB — GFR: EGFR: 40.6

## 2014-04-21 LAB — TROPONIN I
Troponin I: 0.1 ng/mL — ABNORMAL HIGH (ref 0.00–0.09)
Troponin I: 0.07 ng/mL (ref 0.00–0.09)

## 2014-04-21 MED ORDER — ASPIRIN 81 MG PO CHEW
324.0000 mg | CHEWABLE_TABLET | Freq: Once | ORAL | Status: AC
Start: 2014-04-21 — End: 2014-04-21
  Administered 2014-04-21: 324 mg via ORAL
  Filled 2014-04-21: qty 4

## 2014-04-21 MED ORDER — BUMETANIDE 1 MG PO TABS
1.0000 mg | ORAL_TABLET | Freq: Every day | ORAL | Status: DC
Start: 2014-04-21 — End: 2014-04-21

## 2014-04-21 MED ORDER — SPIRONOLACTONE 25 MG PO TABS
25.0000 mg | ORAL_TABLET | Freq: Every day | ORAL | Status: DC
Start: 2014-04-21 — End: 2014-04-21

## 2014-04-21 MED ORDER — ASPIRIN 325 MG PO TBEC
325.0000 mg | DELAYED_RELEASE_TABLET | Freq: Two times a day (BID) | ORAL | Status: DC
Start: 2014-04-21 — End: 2014-04-21

## 2014-04-21 MED ORDER — NALOXONE HCL 0.4 MG/ML IJ SOLN
0.2000 mg | INTRAMUSCULAR | Status: DC | PRN
Start: 2014-04-21 — End: 2014-04-21

## 2014-04-21 MED ORDER — LISINOPRIL 10 MG PO TABS
10.0000 mg | ORAL_TABLET | Freq: Every day | ORAL | Status: DC
Start: 2014-04-21 — End: 2014-04-21

## 2014-04-21 MED ORDER — METOPROLOL TARTRATE 25 MG PO TABS
25.0000 mg | ORAL_TABLET | Freq: Two times a day (BID) | ORAL | Status: DC
Start: 2014-04-22 — End: 2014-04-22
  Administered 2014-04-22: 25 mg via ORAL
  Filled 2014-04-21: qty 1

## 2014-04-21 MED ORDER — ATORVASTATIN CALCIUM 20 MG PO TABS
20.0000 mg | ORAL_TABLET | Freq: Every day | ORAL | Status: DC
Start: 2014-04-21 — End: 2014-04-22
  Administered 2014-04-21 – 2014-04-22 (×2): 20 mg via ORAL
  Filled 2014-04-21: qty 1

## 2014-04-21 MED ORDER — POTASSIUM CHLORIDE CRYS ER 10 MEQ PO TBCR
10.0000 meq | EXTENDED_RELEASE_TABLET | Freq: Every day | ORAL | Status: DC
Start: 2014-04-21 — End: 2014-04-21

## 2014-04-21 MED ORDER — ENOXAPARIN SODIUM 40 MG/0.4ML SC SOLN
SUBCUTANEOUS | Status: AC
Start: 2014-04-21 — End: 2014-04-22
  Filled 2014-04-21: qty 0.4

## 2014-04-21 MED ORDER — ENOXAPARIN SODIUM 40 MG/0.4ML SC SOLN
40.0000 mg | Freq: Every day | SUBCUTANEOUS | Status: DC
Start: 2014-04-21 — End: 2014-04-22
  Administered 2014-04-21 – 2014-04-22 (×2): 40 mg via SUBCUTANEOUS
  Filled 2014-04-21: qty 0.4

## 2014-04-21 MED ORDER — ACETAMINOPHEN 325 MG PO TABS
650.0000 mg | ORAL_TABLET | Freq: Four times a day (QID) | ORAL | Status: AC | PRN
Start: 2014-04-21 — End: 2014-04-22

## 2014-04-21 MED ORDER — SODIUM CHLORIDE 0.45 % IV SOLN
INTRAVENOUS | Status: AC
Start: 2014-04-21 — End: 2014-04-22
  Administered 2014-04-21 – 2014-04-22 (×2): 75 mL/h via INTRAVENOUS

## 2014-04-21 MED ORDER — INFLUENZA VAC SPLIT HIGH-DOSE 0.5 ML IM SUSY
0.5000 mL | PREFILLED_SYRINGE | Freq: Once | INTRAMUSCULAR | Status: AC
Start: 2014-04-21 — End: 2014-04-21
  Administered 2014-04-21: 0.5 mL via INTRAMUSCULAR
  Filled 2014-04-21 (×2): qty 0.5

## 2014-04-21 MED ORDER — ASPIRIN 81 MG PO TBEC
81.0000 mg | DELAYED_RELEASE_TABLET | Freq: Every day | ORAL | Status: DC
Start: 2014-04-22 — End: 2014-04-22
  Administered 2014-04-22: 81 mg via ORAL
  Filled 2014-04-21: qty 1

## 2014-04-21 MED ORDER — ATORVASTATIN CALCIUM 20 MG PO TABS
ORAL_TABLET | ORAL | Status: AC
Start: 2014-04-21 — End: 2014-04-22
  Filled 2014-04-21: qty 1

## 2014-04-21 NOTE — Consults (Addendum)
[facility] CONSULTATION REPORT    Date Time: 04/21/2014 2:51 PM  Patient Name: Gregory [name], Gregory [name]  Requesting Physician: Oneita Hurt, MD       Assessment:   CHF, systolic  Syncope with hypotension  Elevated Cr secondary to diuresis          Plan:   Will gently hydrate, keep on one diuretic Lasix 20 mg qd and f/u Dr Dub Mikes 1 week. Keep all other meds the same . I think trop elevation is due to acute kidney injury and not cardiac event.        Signed by: Lauree Chandler, MD  OFFICE [phone]  MD LINE [phone]      Reason for Consultation:   Hypotension, dizziness      History:   Gregory [name] is a 68 y.o. male who presents to the hospital on 04/21/2014 with dizziness  relavent cardiac history CHF. Pt has had cardiac cath showing mild CAD that should be rx's medically. He had been on 2 diuretics and had developed hypotension with dizziness. He has had no CP with exertion, or any rapid HR while admitted here.    Past Medical History:     Past Medical History   Diagnosis Date   . Asthma without status asthmaticus      childhood   . Arthritis      bilat shoulders, lt knee   . Fracture of unspecified bones      1974 lt shoulder   . CHF (congestive heart failure)        Past Surgical History:     Past Surgical History   Procedure Laterality Date   . Colonoscopy     . Excision, ganglion  12/19/2012     Procedure: EXCISION, GANGLION;  Surgeon: Jillyn Hidden, MD;  Location: [facility];  Service: Orthopedics;  Laterality: Right;  injection right and left shoulder   . Injection, medication  12/19/2012     Procedure: INJECTION, MEDICATION;  Surgeon: Jillyn Hidden, MD;  Location: [facility];  Service: Orthopedics;  Laterality: Bilateral;   . Hernia repair       lt inguinal age 79   . Arthroplasty, shoulder, total  06/18/2013     Procedure: ARTHROPLASTY, SHOULDER, TOTAL;  Surgeon: Wendi Snipes, MD;  [facility];  Service: Orthopedics;  Laterality: Left;       Family History:      Family History   Problem Relation Age of Onset   . Family history unknown: Yes   No Fhx CAD    Social History:     History     Social History   . Marital Status: Married     Spouse Name: N/A     Number of Children: N/A   . Years of Education: N/A     Social History Main Topics   . Smoking status: Never Smoker    . Smokeless tobacco: Never Used   . Alcohol Use: Yes      Comment: 1 drink/month   . Drug Use: No   . Sexual Activity: Not on file     Other Topics Concern   . Not on file     Social History Narrative       Allergies:   No Known Allergies    Medications:     Prescriptions prior to admission   Medication Sig   . aspirin EC 325 MG EC tablet Take 1 tablet (325 mg  total) by mouth every 12 (twelve) hours.   [name] atorvastatin (LIPITOR) 20 MG tablet Take 1 tablet (20 mg total) by mouth daily.   . bumetanide (BUMEX) 1 MG tablet Take 1 tablet (1 mg total) by mouth daily.   . furosemide (LASIX) 20 MG tablet Take 1 tablet (20 mg total) by mouth daily.   [name] lisinopril (PRINIVIL,ZESTRIL) 10 MG tablet Take 1 tablet (10 mg total) by mouth daily.   . metoprolol (LOPRESSOR) 25 MG tablet Take 25 mg by mouth 2 (two) times daily.      . potassium chloride (KLOR-CON M) 10 MEQ tablet Take 1 tablet (10 mEq total) by mouth daily.   [name] spironolactone (ALDACTONE) 25 MG tablet Take 25 mg by mouth daily. Take 1/2 tablet 0.5 mg by mouth daily                    _0 @       Review of Systems:   Comprehensive review of systems including ++constitutional, eyes, ears, nose, mouth, throat, ++cardiovascular, GI, GU, musculoskeletal, integumentary, respiratory, ++neurologic, psychiatric, and endocrine is negative other than what is mentioned already in the history of present illness    Physical Exam:     Filed Vitals:    04/21/14 1048   BP: 97/64   Pulse:    Temp:    Resp:    SpO2:      Temp (24hrs), Avg:97.2 F (36.2 C), Min:96.6 F (35.9 C), Max:97.9 F (36.6 C)      Intake and Output Summary (Last 24 hours) at Date Time  No intake or  output data in the 24 hours ending 04/21/14 1451    GENERAL: Patient is in no acute distress   HEENT: No scleral icterus or conjunctival pallor, moist mucous membranes   NECK: No jugular venous distention or thyromegaly, normal carotid upstrokes without bruits   CARDIAC: Normal apical impulse, regular rate and rhythm, with normal S1 and S2, and no murmurs, rubs, or gallops   CHEST: Clear to auscultation bilaterally, normal respiratory effort  ABDOMEN: No abdominal bruits, masses, or hepatosplenomegaly, nontender, non-distended, good bowel sounds   EXTREMITIES: No clubbing, cyanosis, or edema, 2+ DP, PT, and femoral pulses bilaterally without bruits SKIN: No rash or jaundice   NEUROLOGIC: Alert and oriented to time, place and person, normal mood and affect MUSCULOSKELETAL: Normal muscle strength and tone.      Labs Reviewed:   Recent CMP   Recent Labs      04/21/14   0438   GLUCOSE  106*   BUN  36.0*   CREATININE  2.0*   SODIUM  141   CO2  26   CALCIUM  9.6   ALBUMIN  3.9   AST (SGOT)  38*   ALT  42   GLOBULIN  2.6     Recent CARDIAC ENZYMES No results for input(s): TROPONIN, ISTATTROPONI, CK in the last 24 hours.    Invalid input(s): TROPONINT, CKMB[24  Recent TSH Invalid input(s): TSH[24  Recent CBC WITH DIFF   Recent Labs      04/21/14   0357   RBC  3.07*   HEMOGLOBIN  10.3*   HEMATOCRIT  30.7*   MCV  100.0   MCHC  33.6   RDW  14   MPV  11.1   PLATELETS  161     Recent LFT   Recent Labs      04/21/14   0438   AST (SGOT)  38*  ALT  42   ALBUMIN  3.9   GLOBULIN  2.6     Recent LIPID PANEL No results for input(s): CHOL, TRIG, HDL in the last 24 hours.    Invalid input(s): LDLC, VLDLC, LRAT[24  Recent ABG No results for input(s): TEMP, FIO2, RATE, MODE, ETCO2, PEEP in the last 24 hours.    Invalid input(s): APH, APCO2, APO2, AHCO3, ATCO2, ABE, AOSAT, ABGS, ALLEN, STATS, O2DEL, O2FLO, PRESS, VNTMN, PRSUP, TIVOL[24        Rads:   Radiological Procedure reviewed.      chest X-ray  ECG- nsr nsst changes

## 2014-04-21 NOTE — ED Provider Notes (Signed)
EMERGENCY DEPARTMENT HISTORY AND PHYSICAL EXAM     Physician/Midlevel provider first contact with patient: 04/21/14 0353         Date: 04/21/2014  Patient Name: Gregory [name]    History of Presenting Illness     Chief Complaint   Patient presents with   . Nausea   . Dizziness       History Provided By: Pt and pt's husband    Chief Complaint: dizziness  Onset: 12:00 AM today  Timing: Intermittent  Severity: Moderate  Modifying Factors: only comes on when standing up or walking   Associated Symptoms: nausea and vomiting (1 episode). Denies CP, SOB, missing doses of regular medication.    Additional History: Gregory [name] is a 68 y.o. male with a history of asthma, arthritis, and CHF. He presents to the ED BIBA with intermittent dizziness when standing up or walking since 12:00 AM today. He also c/o nausea and vomiting (1 episode). Pt reports he was recently treated for CHF and put on multiple new medications.     Denies CP, SOB, missing doses of regular medication.    PCP: Otto Herb, MD  Cardiology: Dr. Karle Starch, MD      Current Facility-Administered Medications   Medication Dose Route Frequency Provider Last Rate Last Dose   . aspirin chewable tablet 324 mg  324 mg Oral Once Jimmye Norman, MD         Current Outpatient Prescriptions   Medication Sig Dispense Refill   . aspirin EC 325 MG EC tablet Take 1 tablet (325 mg total) by mouth every 12 (twelve) hours. 30 tablet 0   . atorvastatin (LIPITOR) 20 MG tablet Take 1 tablet (20 mg total) by mouth daily. 30 tablet 3   . bumetanide (BUMEX) 1 MG tablet Take 1 tablet (1 mg total) by mouth daily. 90 tablet 3   . furosemide (LASIX) 20 MG tablet Take 1 tablet (20 mg total) by mouth daily. 90 tablet 3   . lisinopril (PRINIVIL,ZESTRIL) 10 MG tablet Take 1 tablet (10 mg total) by mouth daily. 30 tablet 3   . metoprolol (LOPRESSOR) 25 MG tablet Take 25 mg by mouth 2 (two) times daily.     5   . potassium chloride (KLOR-CON M) 10 MEQ tablet Take 1 tablet  (10 mEq total) by mouth daily. 90 tablet 3   . spironolactone (ALDACTONE) 25 MG tablet Take 25 mg by mouth daily. Take 1/2 tablet 0.5 mg by mouth daily    0       Past History     Past Medical History:  Past Medical History   Diagnosis Date   . Asthma without status asthmaticus      childhood   . Arthritis      bilat shoulders, lt knee   . Fracture of unspecified bones      1974 lt shoulder   . CHF (congestive heart failure)        Past Surgical History:  Past Surgical History   Procedure Laterality Date   . Colonoscopy     . Excision, ganglion  12/19/2012     Procedure: EXCISION, GANGLION;  Surgeon: Jillyn Hidden, MD;  Location: [facility];  Service: Orthopedics;  Laterality: Right;  injection right and left shoulder   . Injection, medication  12/19/2012     Procedure: INJECTION, MEDICATION;  Surgeon: Jillyn Hidden, MD;  Location: [facility];  Service: Orthopedics;  Laterality: Bilateral;   .  Hernia repair       lt inguinal age 7   . Arthroplasty, shoulder, total  06/18/2013     Procedure: ARTHROPLASTY, SHOULDER, TOTAL;  Surgeon: Wendi Snipes, MD;  Location: [facility];  Service: Orthopedics;  Laterality: Left;       Family History:  Family History   Problem Relation Age of Onset   . Family history unknown: Yes       Social History:  History   Substance Use Topics   . Smoking status: Never Smoker    . Smokeless tobacco: Never Used   . Alcohol Use: Yes      Comment: 1 drink/month       Allergies:  No Known Allergies    Review of Systems     Review of Systems   Respiratory: Negative for shortness of breath.    Cardiovascular: Negative for chest pain.   Gastrointestinal: Positive for nausea and vomiting (1 episode).   Allergic/Immunologic:        NKDA   Neurological: Positive for dizziness.   All other systems reviewed and are negative.      Physical Exam   BP 97/65 mmHg  Pulse 68  Temp(Src) 97.9 F (36.6 C)  Resp 21  Ht 1.676 m  Wt 70.308 kg  BMI 25.03 kg/m2  SpO2 97%    Physical Exam    Constitutional: Patient is oriented to person, place, and time and well-developed, well-nourished, and in no distress.   Head: Normocephalic and atraumatic.   ENT/Eyes: EOM are normal. Pupils are equal, round, and reactive to light. MMM.  Neck: Normal range of motion. Neck supple.   Cardiovascular: Normal rate and regular rhythm.   Pulmonary/Chest: Effort normal. No respiratory distress. Faint crackles in bases  Abdominal: Soft. There is no tenderness. No rebound or guarding.  Musculoskeletal: Normal range of motion. 1+ pedal edema bilat  Neurological: Patient is alert and oriented to person, place, and time. No gross weakness.   Skin: Skin is warm and dry.       Diagnostic Study Results     Labs -     Results     Procedure Component Value Units Date/Time    Comprehensive Metabolic Panel (CMP) [[id number]]  (Abnormal) Collected:  04/21/14 0438    Specimen Information:  Blood Updated:  04/21/14 0459     Glucose 106 (H) mg/dL      BUN 36.0 (H) mg/dL      Creatinine 2.0 (H) mg/dL      Sodium 141 mEq/L      Potassium 4.5 mEq/L      Chloride 105 mEq/L      CO2 26 mEq/L      CALCIUM 9.6 mg/dL      Protein, Total 6.5 g/dL      Albumin 3.9 g/dL      AST (SGOT) 38 (H) U/L      ALT 42 U/L      Alkaline Phosphatase 97 U/L      Bilirubin, Total 1.8 (H) mg/dL      Globulin 2.6 g/dL      Albumin/Globulin Ratio 1.5      Anion Gap 10.0     GFR [[id number]] Collected:  04/21/14 0438     EGFR 40.6 Updated:  04/21/14 0459    CBC and differential [[id number]]  (Abnormal) Collected:  04/21/14 0357    Specimen Information:  Blood / Blood Updated:  04/21/14 0455     WBC 4.73 x10 3/uL  RBC 3.07 (L) x10 6/uL      Hgb 10.3 (L) g/dL      Hematocrit 30.7 (L) %      MCV 100.0 fL      MCH 33.6 (H) pg      MCHC 33.6 g/dL      RDW 14 %      Platelets 161 x10 3/uL      MPV 11.1 fL      Neutrophils 58 %      Lymphocytes Automated 33 %      Monocytes 6 %      Eosinophils Automated 3 %      Basophils Automated 0 %      Immature Granulocyte  Unmeasured %      Neutrophils Absolute 2.73 x10 3/uL      Abs Lymph Automated 1.54 x10 3/uL      Abs Mono Automated 0.30 x10 3/uL      Abs Eos Automated 0.15 x10 3/uL      Absolute Baso Automated 0.01 x10 3/uL      Absolute Immature Granulocyte Unmeasured x10 3/uL     Troponin I [[id number]]  (Abnormal) Collected:  04/21/14 0357    Specimen Information:  Blood Updated:  04/21/14 0428     Troponin I 0.10 (H) ng/mL           Radiologic Studies -   Radiology Results (24 Hour)     Procedure Component Value Units Date/Time    Chest 2 Views [[id number]] Resulted:  04/21/14 0426    Order Status:  Sent Updated:  04/21/14 0426      .      Medical Decision Making   I am the first provider for this patient.    I reviewed the vital signs, available nursing notes, past medical history, past surgical history, family history and social history.    Vital Signs-Reviewed the patient's vital signs.     Patient Vitals for the past 12 hrs:   BP Temp Pulse Resp   04/21/14 0330 97/65 mmHg - 68 21   04/21/14 0316 97/64 mmHg 97.9 F (36.6 C) 87 20       Pulse Oximetry Analysis - Normal, 97% on RA    Old Medical Records: Nursing notes.     Cardiac Monitor:   Rate: 64   Rhythm: Normal Sinus Rhythm   EKG:  Interpreted by the Emergency Physician.   Time Interpreted: 3:12   Rate: 88   Rhythm: Normal Sinus Rhythm    Interpretation: normal axis, non-specific ST changes, low voltage diffusely   Comparison: No change compared to prior EKG dated december 2014     ED Course:   4:57 AM - Discussed pt case with Dr. Tyson Babinski, internal medicine, who accepts the pt on behalf of Dr. Raynelle Highland.    5:16 AM - Paged pt's cardiologist.    5:21 AM - Discussed pt case with Dr. Reece Levy, cardiology, who will consult.    Provider Notes: [phone] presented with exertional SOB and dizziness. Cardiac hx. CXR benign. Labs showed elevated trop. Mildly elevated Cr. Gentle rehydration. Aspirin. Consulted cardiology. Admit. Stable with low BP on re-eval.     Core Measures:  Pt  given ASA in ED.       Diagnosis     Clinical Impression:   1. Elevated troponin    2. Dizziness        _______________________________    Attestations:  This note is prepared by Pearson Forster, acting as  Scribe for Hilliard Clark, MD.    Hilliard Clark, MD:  The scribe's documentation has been prepared under my direction and personally reviewed by me in its entirety.  I confirm that the note above accurately reflects all work, treatment, procedures, and medical decision making performed by me.    _______________________________        Jimmye Norman, MD  04/22/14 [phone]

## 2014-04-21 NOTE — Progress Notes (Signed)
The patient and or patient's representative were informed both verbally and in writing that they are placed in Observation/Outpatient status.  A copy of the written notification was placed in the shadow chart.  Yareth Macdonnell Ext [phone]

## 2014-04-21 NOTE — ED Notes (Signed)
Pt BIBM c/o dizziness started 12am and vomited x 1, c/o intermittent SOB with exertion x 3 months. Denies any CP.

## 2014-04-21 NOTE — H&P (Signed)
Patient Type: V     ATTENDING PHYSICIAN: Myrene Buddy, MD     HISTORY OF PRESENT ILLNESS:  This is a 68 year old male with past medical history significant for  bronchial asthma, degenerative joint disease, hypertension, congestive  heart failure, who presented to the emergency room with chief complaint of  intermittent dizziness when he is standing and walking.  It started a  couple days prior to presentation.  Some mild nausea and vomiting x1.  No  fever, no chills, no chest pain, no shortness of breath.  Seen in emergency  room and evaluated.  He was hypotensive, blood pressure 80/50.  His labs  showed a creatinine of 2.0, which is abnormal for him.  He had normal  kidneys a few months back and mild elevated troponin 0.10.  Admitted with  diagnosis of dizziness, hypotension, elevated troponin, and renal  insufficiency.  No fever, no chills, no diarrhea.     REVIEW OF SYSTEMS:  Otherwise unremarkable.     PAST MEDICAL HISTORY:  Hypertension, congestive heart failure, degenerative joint disease,  bronchial asthma.     PAST SURGICAL HISTORY:  Colonoscopy, hernia repair, and arthroplasty of the shoulder.     SOCIAL HISTORY:  No smoking.  He does drink occasionally.  No drug use.     ALLERGIES:  None documented.     MEDICATIONS:  At home, he was taking multiple diuretics including Bumex, Lasix, and  Aldactone.  He is also on metoprolol, lisinopril, and Lipitor along with  potassium chloride.     PHYSICAL EXAMINATION:  GENERAL:  Seen at bedside, awake and alert, pleasant, not in any distress.  VITAL SIGNS:  Blood pressure currently is 90/64, heart rate 90,  respirations 18, temperature 97, saturation 96%.  HEAD AND NECK:  Supple, no adenopathy.  No pallor or jaundice.  HEART:  Regular rhythm, no gallop, no murmur.  LUNGS:  Clear to auscultation, no rales or wheezing.  Saturation 96% on  room air.  Chest x-ray showed no acute process.  ABDOMEN:  Soft, nontender, bowel sounds present, no organomegaly.  EXTREMITIES:   Lower extremities show no edema.  NEUROLOGIC:  Intact.     LABORATORY AND DIAGNOSTIC DATA:  WBC is 4.7, hematocrit 31, platelet count 161.  BUN and creatinine is 36  and 2.0.  Sodium is 141, potassium 4.5, chloride 105, bicarbonate 26.   Glucose is 106.  Liver function enzymes within normal limits.  Troponin  0.01.     ASSESSMENT AND PLAN:  A 68 year old male with the above medical history, now with:  1.  Dizziness, hypotension, likely related to volume depletion.  He has  been taking multiple diuretics.  We will continue intravenous hydration and  check his orthostatic blood pressure.  Hold off on diuretic at this time.  2.  Renal insufficiency, acute, likely prerenal.  Cannot rule out acute  kidney injury from hypertension.  We will hydrate him and follow  laboratories in the a.m.  Again, we will hold diuretics along with  angiotensin converting enzyme inhibitor.  3.  Elevated troponin.  This could be related to demand ischemia,  apparently had a recent heart catheterization recently.  Cardiology  consultation has been contacted.  He is on aspirin.  We will do serial  enzymes.      Plan of care discussed with the patient and patient's wife at the bedside.           D:  04/21/2014 14:42 PM by Dr. Dorris Carnes A. Al Khouri,  MD [phone])  T:  04/21/2014 15:04 PM by       Lorin Glass: [id number]) (Doc ID: [id number])

## 2014-04-21 NOTE — Progress Notes (Signed)
VTE/PE Risk Screening  Complete Upon Admission and Transfer to Different Level of Care  Completed by nurse: Ruffin Frederick 04/21/2014 12:14 PM   -----------------------------------------------------------------------------------------------------------  SECTION 1 - Risk Screening     _0   Patient currently receiving anticoagulation therapy (Heparin, Lovenox, Coumadin, Pradaxa, Xarelto, Eliquis or Arixtra Only) and received 1 dose within 24 hours of admission STOP HERE   _1   VTE/PE prophylaxis currently prescribed elsewhere - STOP HERE   _2   Comfort Care - STOP HERE   _3   Clinical Trials - STOP HERE    Contraindications: Patients with a history of the following conditions cannot haveSequential compression devices (SCD     _4  Any of these conditions present , Call MD for pharmacological prophylaxis or ask MD to document reason for not having both mechanical and pharmacologic VTE prophylaxis   _5  Post-op vein ligation   _6  Suspected VTE   _7  Cellulitis/Dermatitis of the leg   _8  Severe ischemic Vascular disease   _9  Edema related to Congestive Heart Faliure   _10  Gangrene   _11  Recent skin graft  -----------------------------------------------------------------------------------------------------------  SECTION 2 - Risk Factors (Check all that apply)    Moderate Risk Factors   _12   Heart Failure (current or history of)   _13   Respiratory Failure   _14   Acute Myocardial Infarction (AMI)   _15   Acute Infection   _16   Rheumatologic Disorder   _17   Elderly age (68 years old)   _18   Ongoing hormonal treatment / estrogen use (including Tamoxifen, Raloxifene)   _19   Obesity (BMI >/= 30kg/m2)    High Risk Factors   _20   Recent (</= 1 month) trauma/surgery    Highest Risk Factors   _21   Active Cancer   _22   Previous VTE   _23   Reduced mobility (>24 hrs; current or anticipated)   _24   Known thrombophilic condition (hematological disorders that promote thrombosis)      _25   No boxes checked in this section indicate patient is at low risk for  VTE. No VTE Prophylaxis indicated.       _26   One or more risk factors present, enter an EPIC order for Sequential compression devices (SCD). Use per protocol, MD signature required.

## 2014-04-21 NOTE — Plan of Care (Signed)
Problem: Moderate/High Fall Risk Score >/=15  Goal: Patient will remain free of falls  Pt will remain fall and injury free. Patient will be frequently reoriented  to the surroundings.   Visually check the patient hourly, or more frequently as determined by care team.   Call lights will be answered promptly. Use a bed and/or chair personal alarm.   Encourage family/social contacts for reorientation.   Assessment of fall risk will be done each shift.      Problem: Health Promotion  Goal: Knowledge - disease process  Extent of understanding conveyed about a specific disease process.   The patient and care giver's learning abilities have been assessed. Today's individualized plan of care to chf, elevated troponin was discussed with the patient and care giver and agree to it. Patient and care giver demonstrates understanding of disease process, treatment plan, medications and consequences of noncompliance. All questions and concerns were addressed.

## 2014-04-22 LAB — ECG 12-LEAD
Atrial Rate: 88 {beats}/min
P Axis: 52 degrees
P-R Interval: 142 ms
Q-T Interval: 352 ms
QRS Duration: 74 ms
QTC Calculation (Bezet): 425 ms
R Axis: -17 degrees
T Axis: 86 degrees
Ventricular Rate: 88 {beats}/min

## 2014-04-22 LAB — TROPONIN I: Troponin I: 0.08 ng/mL (ref 0.00–0.09)

## 2014-04-22 LAB — BASIC METABOLIC PANEL
Anion Gap: 9 (ref 5.0–15.0)
Anion Gap: 7 (ref 5.0–15.0)
BUN: 34 mg/dL — ABNORMAL HIGH (ref 9.0–28.0)
BUN: 30 mg/dL — ABNORMAL HIGH (ref 9.0–28.0)
CO2: 23 mEq/L (ref 22–29)
CO2: 24 mEq/L (ref 22–29)
Calcium: 9 mg/dL (ref 8.5–10.5)
Calcium: 9 mg/dL (ref 8.5–10.5)
Chloride: 105 mEq/L (ref 100–111)
Chloride: 106 mEq/L (ref 100–111)
Creatinine: 1.6 mg/dL — ABNORMAL HIGH (ref 0.7–1.3)
Creatinine: 1.5 mg/dL — ABNORMAL HIGH (ref 0.7–1.3)
Glucose: 90 mg/dL (ref 70–100)
Glucose: 129 mg/dL — ABNORMAL HIGH (ref 70–100)
Potassium: 4.4 mEq/L (ref 3.5–5.1)
Potassium: 4.1 mEq/L (ref 3.5–5.1)
Sodium: 137 mEq/L (ref 136–145)
Sodium: 137 mEq/L (ref 136–145)

## 2014-04-22 LAB — GFR
EGFR: 52.5
EGFR: 56.5

## 2014-04-22 LAB — HEMOLYSIS INDEX
Hemolysis Index: 3 (ref 0–18)
Hemolysis Index: 5 (ref 0–18)

## 2014-04-22 NOTE — Discharge Summary -  Nursing (Signed)
Pt to be discharge home. Pt given discharge teaching. Follow-up, when to call the doctor, and when to seek medical attention were reviewed. Medication indications, side effects, and times were reviewed. Pt given education and additional information on hypotension, dehydration, and dizziness. All questions were addressed and pt states understanding. Pt left unit via wheelchair with all personal belongings.

## 2014-04-22 NOTE — Plan of Care (Signed)
Problem: Hemodynamic Status: Cardiac  Goal: Stable vital signs and fluid balance  Outcome: Progressing  [name] learning abilities have been assessed. Today's individualized plan of care to do hourly rounds, fall prevention, IV fluids and monitor vitals was discussed with the patient, who agree to it. Patient demonstrates understanding of disease process, treatment plan, medications and consequences of noncompliance. All questions and concerns were addressed.       Patient is A&Ox4. SR on monitor, BP low 91/63.  Patient has no complaints for dizziness, SOB, chest pain.  IV fluids running at 75 ml/hr.  Patient ambulated to bathroom with standby assist only; has steady gait.  Continuing to monitor via hourly rounds.

## 2014-04-22 NOTE — Plan of Care (Signed)
Problem: Health Promotion  Goal: Knowledge - disease process  Extent of understanding conveyed about a specific disease process.   Outcome: Completed Date Met:  04/22/14  The patient and care giver's learning abilities have been assessed. Today's individualized plan of care to continue iv hydration, monitor vitals/labs, consult cardiology, and discharge pt were discussed with the patient and care giver and agree to it. Patient and care giver demonstrates understanding of disease process, treatment plan, medications and consequences of noncompliance. All questions and concerns were addressed. Pt alert and oriented, states no pain. IV hydration was continued. Pt receiving metoprolol. Cardiology consulted pt. Medication indications and side effects were given, including dizziness. Pt encouraged to call before getting out of bed, has personal items within reach, and call light at bedside. Pt to be discharged and given discharge teaching. Pt states understanding.

## 2014-04-22 NOTE — Discharge Instructions (Signed)
Dehydration (Adult)  Dehydration occurs when your body loses too much fluid. This may be the result of prolonged vomiting or diarrhea,excessive sweating, or a high fever. It may also happen if you don't drink enough fluid when you're sickor out in the heat.Misuse of diuretics (water pills) can also be a cause.  Symptoms include thirst and decreased urine output. You may also feel dizzy, weak, fatigued, or very drowsy. The diet described below is usually enough to treat dehydration. In some cases, you may needmedicine.  Home care   Drink at least 12 8-ounce glasses of fluid every day to resolve the dehydration. Fluid may include water; orange juice; lemonade; apple, grape, or cranberry juice; clear fruit drinks; electrolyte replacement and sports drinks; and teas and coffee without caffeine. If you have been diagnosed with a kidney disease, ask your doctor how much and what types of fluids you should drink to prevent dehydration. If you have kidney disease, fluid can build up in the body. This can be dangerous to your health.   If you have a fever, muscle aches, or a headache as a result of a cold or flu, you may take acetaminophen or ibuprofen, unless another medicine was prescribed.If you have chronic liver or kidney disease, or have ever had a stomach ulcer or gastrointestinal bleeding, talk with your health care provider before using these medicines. Don't take aspirin if you are younger than 18 and have a fever.Aspirin raises the chance forsevere liver injury.  Follow-up care  Follow up with your health care provider, or as advised.  When to seek medical advice  Call your health care provider right awayif any of theseoccur:   Continued vomiting   Frequent diarrhea (more than 5 times a day); blood (red or black color) or mucus in diarrhea   Blood in vomit or stool   Swollen abdomen or increasing abdominal pain   Weakness, dizziness, or fainting   Unusual drowsiness or confusion   Reduced  urine output or extreme thirst   Fever of 100.71F (34C)or higher   2000-2015 [employer] [address] [place] [address]. All rights reserved. This information is not intended as a substitute for professional medical care. Always follow your healthcare professional's instructions.          Discharge Instructions for Low Blood Pressure (Hypotension)  You have been diagnosed with hypotension. When you have hypotension, your blood pressure is lower than normal. Low blood pressure can make you feel dizzy or faint. This condition is sometimes a side effect of taking certain medications, including medications for high blood pressure (hypertension). It can also result from medical conditions such as dehydration.  Home care  These home care steps can help manage your condition:   Follow your doctor's instructions.   Rest in bed and ask for help with daily activities until you feel better. You may need to slowly increase the amount of time you spend sitting or doing light activity.   Don't drive while your blood pressure is not controlled.   Be careful when you get up from sitting or lying down.   Take your time. Sudden movements can cause dizziness or fainting.   When you first sit up after lying down, be sure to sit in bed for 30 seconds or so before getting up to walk.   Tell yourdoctor about the medications you are taking. Many kinds of medications trigger low blood pressure.   Limit your alcohol intake to no more than 2  drinks a day for men and 1 drink a day for women.   Prevent dehydration by drinking plenty of fluids, unless otherwise instructed by your doctor   Learn to take your own blood pressure. Keep a record of your results. Ask your doctor which readings mean that you need medical attention.   Tell your family members to call an ambulanceif you become unconscious. Request that they learn CPR.  Follow-up care  Make a follow-up appointment as directed by our staff.      [address] [employer], [place] [address]. All rights reserved. This information is not intended as a substitute for professional medical care. Always follow your healthcare professional's instructions.        Dehydration  The human body is comprised largely of water. If you lose more fluids than you take in, you can become dehydrated. This means there are not enough fluids in your body for it to function right. Mild dehydration can cause weakness, confusion, or muscle cramps. In extreme cases, it can lead to brain damage and even death. That's why prompt treatment is crucial.    Risk Factors  Anyone can become dehydrated. But infants, children, and older adults are at greatest risk. You are most likely to lose fluids with severe vomiting, diarrhea, or a fever. Exercising or working hard--especially in hot weather--can also cause excess fluid loss.  What to Do  Drinking liquids is the best way to prevent dehydration. Water is best, but juice or frozen pops can also help. Your doctor may suggest electrolyte solutions for sick infants and young children.    When to Go to the Emergency Room (ER)  Go to an ER right away for these symptoms:  Adults   Very dark urine and little urine output   Dizziness, weakness, confusion, fainting  Children   Sunken eyes   Little or no urine output (for infants, no wet diaper in 8 hours)   Very dark urine   Skin that doesn't bounce back quickly when pinched   Crying without tears     2000-2015 [employer] [facility] [place] [facility] [place], [place] [address]. All rights reserved. This information is not intended as a substitute for professional medical care. Always follow your healthcare professional's instructions.

## 2014-04-22 NOTE — Discharge Summary (Signed)
Discharge Date: 04/22/2014     ATTENDING PHYSICIAN:  Myrene Buddy, MD     HISTORY OF PRESENT ILLNESS:  A 68 year old male with history of bronchial asthma, degenerative joint  disease, hypertension, congestive heart failure, admitted to [facility] with chief complaint of dizziness associated with some  nausea and vomiting x1.  No chest pain, no shortness of breath, no fever or  chills, was seen in emergency room, he was hypotensive, blood pressure  80/50.  He had an elevated creatinine at 2.0 with a baseline creatinine  less than 1.  He was to have been taking a mixed dose of diuretics.  He was  on Bumex, Lasix, and Aldactone along with ACE inhibitor.  He is clearly  overall hydrated with excessive use of diuretics.  He was taken off all  diuretics, started on IV hydration.  He did have slightly elevated troponin  at 0.10, which normalized on the followup troponin to 0.07.  It was thought  this was related to abnormal kidney function, his creatinine improved down  to 1.5.  He had very good urine output.  He will continue on IV hydration  throughout his hospital stay.  He was eager to go home today on April 22, 2014.  His blood pressure is fairly stable.  His heart rate is within  normal range.  I encouraged that he should be on Bumex altogether.  He can  resume Lasix and Aldactone on Monday and maybe his ACE inhibitor.  I  encouraged him to drink enough water and follow up with his primary care  physician next week to check his renal panel.       DISCHARGE DIAGNOSES:    1.  Dizziness, lightheadedness related to volume depletion, orthostatic  hypotension, resolved.  2.  Acute kidney injury related to over diuresis, currently  creatinine  1.5, his creatinine usually at 1.0.  This should be followed as an  outpatient.  I encouraged fluid intake and is off diuretic for another 72  hours.  3.  Hypertension, fairly well controlled.  4.  Congestive heart failure fairly compensated.  5.  Elevated  troponin related to chronic kidney disease.     DISCHARGE MEDICATIONS:  As per medication reconciliation.           D:  04/22/2014 22:25 PM by Dr. Dorris Carnes A. Raynelle Highland, MD [phone])  T:  04/22/2014 23:20 PM by       Lorin Glass: [id number]) (Doc ID: [id number])

## 2014-04-22 NOTE — Progress Notes (Signed)
Assessment/Plan:   cardiomyopathy  Patient Active Problem List    Diagnosis Date Noted   . Elevated troponin 04/21/2014   . Combined systolic and diastolic congestive heart failure 03/18/2014   . Asthma without status asthmaticus    . Primary localized osteoarthrosis, shoulder region 06/18/2013   . Ganglion cyst of wrist, right 12/21/2012   Dehydration due to diuretics responded to IV fluids Creatinine now down to 1.5. Metoprolol restarted for mild ST. He is asymptomatic and could be discharged with office followup next several days     LOS: 1 day     Subjective:  Mr. Kidney denies chest pressure/discomfort, irregular heart beat and dizziness or syncope    Medication side effects: none    Scheduled Meds:  Current Facility-Administered Medications   Medication Dose Route Frequency   . aspirin EC  81 mg Oral Daily   . atorvastatin  20 mg Oral Daily   . enoxaparin  40 mg Subcutaneous Daily   . metoprolol  25 mg Oral BID     Continuous Infusions:  . sodium chloride 75 mL/hr (04/22/14 0101)     PRN Meds:    Objective:  Physical Exam:   BP 113/70 mmHg  Pulse 104  Temp(Src) 97.3 F (36.3 C) (Oral)  Resp 18  Ht 1.676 m (5' 5.98")  Wt 70.308 kg (155 lb)  BMI 25.03 kg/m2  SpO2 98%  General appearance: alert, appears stated age and cooperative  Neck: no adenopathy, no carotid bruit and no JVD  Lungs: clear to auscultation bilaterally  Heart: regular rate and rhythm  Extremities: no edema.    Cardiographics  ECG: ST on monitor .  Echocardiogram: not done    Imaging  Chest X-Ray: n/a     Lab Review   Lab Results   Component Value Date    NA 137 04/22/2014    K 4.1 04/22/2014    CL 106 04/22/2014    CO2 24 04/22/2014    BUN 30.0* 04/22/2014     No results found for: CKTOTAL, CKMB, CKMBINDEX  Lab Results   Component Value Date    WBC 4.73 04/21/2014    HGB 10.3* 04/21/2014    HCT 30.7* 04/21/2014    MCV 100.0 04/21/2014    PLT 161 04/21/2014

## 2014-04-28 ENCOUNTER — Encounter (HOSPITAL_COMMUNITY): Payer: Self-pay

## 2014-04-28 ENCOUNTER — Encounter (HOSPITAL_COMMUNITY): Admission: RE | Payer: Self-pay | Source: Ambulatory Visit

## 2014-04-28 ENCOUNTER — Emergency Department (HOSPITAL_COMMUNITY)
Admission: EM | Admit: 2014-04-28 | Discharge: 2014-04-29 | Disposition: A | Discharge status: Home / Self Care | Payer: Medicare Other | Attending: Emergency Medicine | Admitting: Emergency Medicine

## 2014-04-28 ENCOUNTER — Ambulatory Visit (HOSPITAL_COMMUNITY): Admission: RE | Admit: 2014-04-28 | Payer: Medicare Other | Source: Ambulatory Visit | Admitting: Surgery

## 2014-04-28 DIAGNOSIS — R319 Hematuria, unspecified: Secondary | ICD-10-CM | POA: Diagnosis present

## 2014-04-28 DIAGNOSIS — R339 Retention of urine, unspecified: Secondary | ICD-10-CM | POA: Insufficient documentation

## 2014-04-28 DIAGNOSIS — Z8719 Personal history of other diseases of the digestive system: Secondary | ICD-10-CM | POA: Insufficient documentation

## 2014-04-28 DIAGNOSIS — Z7982 Long term (current) use of aspirin: Secondary | ICD-10-CM | POA: Diagnosis not present

## 2014-04-28 DIAGNOSIS — Z87891 Personal history of nicotine dependence: Secondary | ICD-10-CM | POA: Diagnosis not present

## 2014-04-28 DIAGNOSIS — I10 Essential (primary) hypertension: Secondary | ICD-10-CM | POA: Insufficient documentation

## 2014-04-28 DIAGNOSIS — E039 Hypothyroidism, unspecified: Secondary | ICD-10-CM | POA: Insufficient documentation

## 2014-04-28 DIAGNOSIS — Z79899 Other long term (current) drug therapy: Secondary | ICD-10-CM | POA: Diagnosis not present

## 2014-04-28 SURGERY — TRANSANAL HEMORRHOIDAL DEARTERIALIZATION
Anesthesia: General

## 2014-04-28 MED ORDER — HYDROMORPHONE HCL 1 MG/ML IJ SOLN
1.0000 mg | Freq: Once | INTRAMUSCULAR | Status: AC
  Administered 2014-04-28: 1 mg via INTRAMUSCULAR
  Filled 2014-04-28: qty 1

## 2014-04-28 MED ORDER — LIDOCAINE HCL 2 % EX GEL
1.0000 "application " | Freq: Once | CUTANEOUS | Status: AC
  Administered 2014-04-29: 1 via URETHRAL
  Filled 2014-04-28: qty 10

## 2014-04-28 MED ORDER — OXYCODONE-ACETAMINOPHEN 5-325 MG PO TABS
1.0000 | ORAL_TABLET | Freq: Once | ORAL | Status: AC
  Administered 2014-04-28: 1 via ORAL
  Filled 2014-04-28: qty 1

## 2014-04-28 MED ORDER — LIDOCAINE HCL 2 % EX GEL
CUTANEOUS | Status: AC
  Administered 2014-04-28: 22:00:00
  Filled 2014-04-28: qty 10

## 2014-04-28 MED ORDER — CIPROFLOXACIN HCL 500 MG PO TABS
500.0000 mg | ORAL_TABLET | Freq: Once | ORAL | Status: AC
  Administered 2014-04-28: 500 mg via ORAL
  Filled 2014-04-28: qty 1

## 2014-04-28 NOTE — ED Provider Notes (Signed)
CSN: [id number]     Arrival date & time 04/28/14  2107 History   First MD Initiated Contact with Patient 04/28/14 2113     Chief Complaint  Patient presents with  . Hematuria     HPI  Patient presents from outpatient surgical center with concern of hematuria. Patient had elective repair of hemorrhoids, internal and external. Following the procedure the patient was unable to urinate, had a Foley catheter placed.  After catheter was removed patient had production of substantial amounts of blood, according to the patient and his wife. Subsequent attempts to replace the catheter were unsuccessful. The patient denies other complaints, such as abdominal pain, chest pain, dyspnea, fever, chills, nausea, vomiting.   Past Medical History  Diagnosis Date  . Hemorrhoids   . Rectal bleeding     due to hemorrhoid   . Hypertension   . Hypothyroid    Past Surgical History  Procedure Laterality Date  . Hernia repair      Over ten years ago per patient. He was not sure of the date.  . Rotator cuff repair      both shoulders - patient does not remember the exact date   Family History  Problem Relation Age of Onset  . Aneurysm Mother   . Alzheimer's disease Father   . Cancer Sister     ovarian  . Stroke Brother   . Hypertension Brother   . Colon cancer Neg Hx   . Esophageal cancer Neg Hx   . Stomach cancer Neg Hx   . Rectal cancer Neg Hx    History  Substance Use Topics  . Smoking status: Former Smoker    Quit date: 06/13/1971  . Smokeless tobacco: Never Used  . Alcohol Use: Yes     Comment: 2 beers Monthly.    Review of Systems  All other systems reviewed and are negative.     Allergies  Review of patient's allergies indicates no known allergies.  Home Medications   Prior to Admission medications   Medication Sig Start Date End Date Taking? Authorizing Provider  aspirin 81 MG tablet Take 81 mg by mouth every other day.     Yes Historical Provider, MD  docusate  sodium (COLACE) 100 MG capsule Take 100 mg by mouth every other day.    Yes Historical Provider, MD  Lactobacillus Rhamnosus, GG, (CULTURELLE PO) Take 1 tablet by mouth daily.   Yes Historical Provider, MD  levothyroxine (SYNTHROID, LEVOTHROID) 100 MCG tablet Take 1 tablet (100 mcg total) by mouth daily. 10/19/13  Yes Robyn Haber, MD  loratadine (CLARITIN) 10 MG tablet Take 10 mg by mouth daily.   Yes Historical Provider, MD  Multiple Vitamins-Minerals (CENTRUM SILVER PO) Take 1 tablet by mouth daily.    Yes Historical Provider, MD  Omega-3 Fatty Acids (FISH OIL) 1000 MG CAPS Take 3 capsules by mouth daily.    Yes Historical Provider, MD  phenylephrine-shark liver oil-mineral oil-petrolatum (PREPARATION H) 0.25-3-14-71.9 % rectal ointment Place 1 application rectally 2 (two) times daily as needed for hemorrhoids.   Yes Historical Provider, MD  psyllium (REGULOID) 0.52 G capsule Take 0.52 g by mouth every other day.   Yes Historical Provider, MD   BP 141/82 mmHg  Pulse 84  Temp(Src) 97.6 F (36.4 C) (Oral)  Resp 18  SpO2 97% Physical Exam  Constitutional: He is oriented to person, place, and time. He appears well-developed. No distress.  HENT:  Head: Normocephalic and atraumatic.  Eyes: Conjunctivae and EOM are  normal.  Cardiovascular: Normal rate and regular rhythm.   Pulmonary/Chest: Effort normal. No stridor. No respiratory distress.  Abdominal: He exhibits no distension. There is no tenderness. There is no rebound.  Genitourinary:    Right testis shows no swelling and no tenderness. Left testis shows no swelling and no tenderness.  Musculoskeletal: He exhibits no edema.  Neurological: He is alert and oriented to person, place, and time.  Skin: Skin is warm and dry.  Psychiatric: He has a normal mood and affect.  Nursing note and vitals reviewed.   ED Course  Procedures (including critical care time) Labs Review Labs Reviewed  URINALYSIS, ROUTINE W REFLEX MICROSCOPIC    I  reviewed the patient's chart, there is no record of today's surgical procedure.  12:11 AM Foley placed with assistance of nursing, well tolerated.  Catheter is draining yellow fluid.   MDM   Patient presents due to urinary retention following surgical procedure. Patient is awake, alert, afebrile, soft, non-peritoneal abdomen, and there is low suspicion for pneumoperitoneum or other surgical complication beyond likely inflammatory changes. With nursing assistance patient had placement of a catheter.  Patient tolerated this well, and after several hours of monitoring was appropriate for discharge with outpatient urology follow-up.  (on sign-out the patient is being monitored for complications.  If he remains stable, with no new concerns he will be d/c to f/u w his urologist and general surgeon.)   Carmin Muskrat, MD 04/29/14 [phone]

## 2014-04-28 NOTE — ED Notes (Signed)
Pt coming from the surgical center, his foley was removed and on his way out he had blood coming from his penis, the surgical center was able to place another one and Dr Johney Maine wanted him to come here and have a foley placed an send him home.

## 2014-04-29 LAB — URINALYSIS, ROUTINE W REFLEX MICROSCOPIC
Bilirubin Urine: NEGATIVE
Glucose, UA: >1000 mg/dL — AB
Ketones, ur: NEGATIVE mg/dL
Leukocytes, UA: NEGATIVE
NITRITE: NEGATIVE
Protein, ur: NEGATIVE mg/dL
Specific Gravity, Urine: 1.027 (ref 1.005–1.030)
UROBILINOGEN UA: 0.2 mg/dL (ref 0.0–1.0)
pH: 5.5 (ref 5.0–8.0)

## 2014-04-29 LAB — URINE MICROSCOPIC-ADD ON

## 2014-04-29 MED ORDER — OXYCODONE-ACETAMINOPHEN 5-325 MG PO TABS
1.0000 | ORAL_TABLET | ORAL | Status: AC | PRN
  Administered 2014-04-29: 1 via ORAL
  Filled 2014-04-29: qty 1

## 2014-04-29 MED ORDER — CIPROFLOXACIN HCL 500 MG PO TABS: 500.0000 mg | ORAL_TABLET | Freq: Two times a day (BID) | ORAL | Status: DC

## 2014-04-29 NOTE — Discharge Instructions (Signed)
As discussed, it is important that she speak with your urologist in addition to your surgeon tomorrow to arrange appropriate ongoing care.  Return here for any concerning changes in your condition.

## 2014-04-29 NOTE — ED Provider Notes (Signed)
Signed out by Dr. Vanita Panda.  On recheck, patient's Foley is draining well. Patient and wife updated. They will be given home foley catheter care instructions. Questions were answered. Patient appears stable for discharge.  After history, exam, and medical workup I feel the patient has been appropriately medically screened and is safe for discharge home. Pertinent diagnoses were discussed with the patient. Patient was given return precautions.   Merryl Hacker, MD 04/29/14 [phone]

## 2014-04-29 NOTE — ED Notes (Signed)
Changed pt's cath from standard drainage bag to leg bag  Cath care discussed in detail  Pt and wife had multiple questions and concerns  Reassured pt and wife

## 2014-06-20 ENCOUNTER — Ambulatory Visit (INDEPENDENT_AMBULATORY_CARE_PROVIDER_SITE_OTHER): Payer: Medicare Other | Admitting: Family Medicine

## 2014-06-20 VITALS — BP 142/90 | HR 80 | Temp 97.7°F | Resp 18 | Ht 76.0 in | Wt 202.8 lb

## 2014-06-20 DIAGNOSIS — J209 Acute bronchitis, unspecified: Secondary | ICD-10-CM

## 2014-06-20 DIAGNOSIS — K589 Irritable bowel syndrome without diarrhea: Secondary | ICD-10-CM

## 2014-06-20 LAB — POCT CBC
Granulocyte percent: 61 %G (ref 37–80)
HCT, POC: 49.8 % (ref 43.5–53.7)
Hemoglobin: 16.7 g/dL (ref 14.1–18.1)
Lymph, poc: 2 (ref 0.6–3.4)
MCH, POC: 30.7 pg (ref 27–31.2)
MCHC: 33.5 g/dL (ref 31.8–35.4)
MCV: 91.7 fL (ref 80–97)
MID (cbc): 0.6 (ref 0–0.9)
MPV: 8.4 fL (ref 0–99.8)
POC Granulocyte: 4.1 (ref 2–6.9)
POC LYMPH PERCENT: 29.9 %L (ref 10–50)
POC MID %: 9.1 %M (ref 0–12)
Platelet Count, POC: 174 10*3/uL (ref 142–424)
RBC: 5.43 M/uL (ref 4.69–6.13)
RDW, POC: 14.1 %
WBC: 6.8 10*3/uL (ref 4.6–10.2)

## 2014-06-20 LAB — IFOBT (OCCULT BLOOD): IFOBT: NEGATIVE

## 2014-06-20 MED ORDER — ALPRAZOLAM 0.25 MG PO TABS: 0.2500 mg | ORAL_TABLET | Freq: Two times a day (BID) | ORAL | Status: DC | PRN

## 2014-06-20 MED ORDER — LEVOFLOXACIN 500 MG PO TABS: 500.0000 mg | ORAL_TABLET | Freq: Every day | ORAL | Status: DC

## 2014-06-20 NOTE — Progress Notes (Addendum)
Subjective:  This chart was scribed for Robyn Haber, MD by Mercy Moore, Medial Scribe. This patient was seen in room 12 and the patient's care was started at 4:42 PM.    Patient ID: Carlos Choi, male    DOB: [birth year], 69 y.o.   MRN: [id number] Chief Complaint  Patient presents with   Cough    x5days   Nasal Congestion    x5days    HPI HPI Comments: Carlos Choi is a 69 y.o. male who presents to the [facility] complaining of cold symptoms including productive cough, rhinorrhea, and nasal congestion ongoing for six days. Patient reports that the cold began in his head and moved to his chest. Patient reports treatment with [facility] but denies relief. Patient reports that he is spending time with his grand children and wants this cold to clear up. Patient states that he sees his grand children at least one day out of the week.   Patient reports hemorrhoid surgery 04/28/2104. Patient reports that he was unable to urinate before leaving the facility; following his procedure. Patient reports insertion of catheter: five attempts. Patient reports that his "britches were soaked in blood" when attempting to leave the building after the third attempt. Patient reports successful insertion on the fifth attempt. Patient had the catheter removed at Dr. Cy Blamer, urologist, office. Patient reports irregular bowel movements since the surgery. Patient reports alternating treatment with Milk of Magnesia and Pepto Bismol to moderate his stool for three weeks; the treatment caused him both constipation and diarrhea. Patient reports that he stools are lose; however he denies completely watery diarrhea. Patient denies hematochezia or melena. Patient reports follow up with Dr. Johney Maine, his surgeon after one month. Everything appeared fine.   Patient Active Problem List   Diagnosis Date Noted   Hypothyroid 10/06/2013    Priority: Medium   Adenomatous colon polyp 04/14/2014    Nasal septal ulcer 10/26/2013   Elevated PSA 03/26/2012   Hemorrhoids, internal, with bleeding 06/13/2011   Past Medical History  Diagnosis Date   Hemorrhoids    Rectal bleeding     due to hemorrhoid    Hypertension    Hypothyroid    Past Surgical History  Procedure Laterality Date   Hernia repair      Over ten years ago per patient. He was not sure of the date.   Rotator cuff repair      both shoulders - patient does not remember the exact date   No Known Allergies Prior to Admission medications   Medication Sig Start Date End Date Taking? Authorizing Provider  aspirin 81 MG tablet Take 81 mg by mouth every other day.     Yes Historical Provider, MD  docusate sodium (COLACE) 100 MG capsule Take 100 mg by mouth every other day.    Yes Historical Provider, MD  Lactobacillus Rhamnosus, GG, (CULTURELLE PO) Take 1 tablet by mouth daily.   Yes Historical Provider, MD  levothyroxine (SYNTHROID, LEVOTHROID) 100 MCG tablet Take 1 tablet (100 mcg total) by mouth daily. 10/19/13  Yes Robyn Haber, MD  loratadine (CLARITIN) 10 MG tablet Take 10 mg by mouth daily.   Yes Historical Provider, MD  Multiple Vitamins-Minerals (CENTRUM SILVER PO) Take 1 tablet by mouth daily.    Yes Historical Provider, MD  Omega-3 Fatty Acids (FISH OIL) 1000 MG CAPS Take 3 capsules by mouth daily.    Yes Historical Provider, MD  phenylephrine-shark liver oil-mineral oil-petrolatum (PREPARATION H) 0.25-3-14-71.9 %  rectal ointment Place 1 application rectally 2 (two) times daily as needed for hemorrhoids.   Yes Historical Provider, MD  psyllium (REGULOID) 0.52 G capsule Take 0.52 g by mouth every other day.   Yes Historical Provider, MD  tamsulosin (FLOMAX) 0.4 MG CAPS capsule Take 0.4 mg by mouth daily.   Yes Historical Provider, MD   History   Social History   Marital Status: Married    Spouse Name: N/A    Number of Children: N/A   Years of Education: N/A   Occupational History   Not on file.    Social History Main Topics   Smoking status: Former Smoker    Quit date: 06/13/1971   Smokeless tobacco: Never Used   Alcohol Use: Yes     Comment: 2 beers Monthly.   Drug Use: No   Sexual Activity: Not on file   Other Topics Concern   Not on file   Social History Narrative    Review of Systems  Constitutional: Negative for fever and chills.  HENT: Positive for congestion.   Respiratory: Positive for cough.   Gastrointestinal: Positive for diarrhea and constipation. Negative for blood in stool.       Objective:   Physical Exam  Constitutional: He is oriented to person, place, and time. He appears well-developed and well-nourished. No distress.  HENT:  Head: Normocephalic and atraumatic.  Eyes: EOM are normal. Right eye exhibits no discharge. Left eye exhibits no discharge. No scleral icterus.  Neck: Neck supple. No JVD present. No tracheal deviation present. No thyromegaly present.  Cardiovascular: Normal rate, regular rhythm, normal heart sounds and intact distal pulses.  Exam reveals no gallop and no friction rub.   No murmur heard. Pulmonary/Chest: Effort normal. No respiratory distress. He has wheezes. He has no rales. He exhibits no tenderness.  Abdominal: Soft. Bowel sounds are normal.  Genitourinary: Rectum normal and prostate normal. No penile tenderness.  Prostate is soft.  Minimal stool in rectal ampulla.  Musculoskeletal: Normal range of motion.  Lymphadenopathy:    He has no cervical adenopathy.  Neurological: He is alert and oriented to person, place, and time. No cranial nerve deficit.  Skin: Skin is warm and dry.  Psychiatric: He has a normal mood and affect. His behavior is normal.  Nursing note and vitals reviewed.    Filed Vitals:   06/20/14 1629  BP: 142/90  Pulse: 80  Temp: 97.7 F (36.5 C)  TempSrc: Oral  Resp: 18  Height: 6\' 4"  (1.93 m)  Weight: 202 lb 12.8 oz (91.989 kg)  SpO2: 97%   Results for orders placed or performed in  visit on 06/20/14  POCT CBC  Result Value Ref Range   WBC 6.8 4.6 - 10.2 K/uL   Lymph, poc 2.0 0.6 - 3.4   POC LYMPH PERCENT 29.9 10 - 50 %L   MID (cbc) 0.6 0 - 0.9   POC MID % 9.1 0 - 12 %M   POC Granulocyte 4.1 2 - 6.9   Granulocyte percent 61.0 37 - 80 %G   RBC 5.43 4.69 - 6.13 M/uL   Hemoglobin 16.7 14.1 - 18.1 g/dL   HCT, POC 49.8 43.5 - 53.7 %   MCV 91.7 80 - 97 fL   MCH, POC 30.7 27 - 31.2 pg   MCHC 33.5 31.8 - 35.4 g/dL   RDW, POC 14.1 %   Platelet Count, POC 174 142 - 424 K/uL   MPV 8.4 0 - 99.8 fL  IFOBT POC (  occult bld, rslt in office)  Result Value Ref Range   IFOBT Negative         Assessment & Plan:    I personally performed the services described in this documentation, which was scribed in my presence. The recorded information has been reviewed and is accurate. This chart was scribed in my presence and reviewed by me personally.    ICD-9-CM ICD-10-CM   1. IBS (irritable bowel syndrome) 564.1 K58.9 IFOBT POC (occult bld, rslt in office)     ALPRAZolam (XANAX) 0.25 MG tablet  2. Acute bronchitis, unspecified organism 466.0 J20.9 POCT CBC     levofloxacin (LEVAQUIN) 500 MG tablet     Signed, Robyn Haber, MD

## 2014-06-20 NOTE — Patient Instructions (Signed)
I want you to take the antibiotic one tablet daily. I continue a refill on the alprazolam which she can take to help with the irritable bowel syndrome ROM. Please continue the probiotic. If the bowel situation hasn't improved in 2-3 days, please let me know and we will use a different medication.

## 2014-06-23 ENCOUNTER — Ambulatory Visit (INDEPENDENT_AMBULATORY_CARE_PROVIDER_SITE_OTHER): Payer: Medicare Other | Admitting: Family Medicine

## 2014-06-23 ENCOUNTER — Encounter: Payer: Self-pay | Admitting: Family Medicine

## 2014-06-23 DIAGNOSIS — K589 Irritable bowel syndrome without diarrhea: Secondary | ICD-10-CM

## 2014-06-23 DIAGNOSIS — J208 Acute bronchitis due to other specified organisms: Secondary | ICD-10-CM

## 2014-06-23 MED ORDER — PREDNISONE 20 MG PO TABS: 40.0000 mg | ORAL_TABLET | Freq: Every day | ORAL | Status: DC

## 2014-06-23 MED ORDER — HYOSCYAMINE SULFATE 0.125 MG SL SUBL: 0.1250 mg | SUBLINGUAL_TABLET | SUBLINGUAL | Status: DC | PRN

## 2014-06-23 NOTE — Progress Notes (Signed)
° °  Subjective:    Patient ID: Carlos Choi, male    DOB: 1946/02/14, 69 y.o.   MRN: [id number] This chart was scribed for Robyn Haber, MD by Zola Button, Medical Scribe. This patient was seen in Room 21 and the patient's care was started at 4:34 PM.   HPI HPI Comments: Carlos Choi is a 69 y.o. male who presents to the [facility] for a follow-up. He was seen 3 days ago for productive cough, rhinorrhea and nasal congestion. Patient states he still has symptoms, but his symptoms have not worsened. His productive cough tends to bring up more phlegm in the morning.  Patient has had indigestion problems for a while and would like some medications for it. He denies cramps and diarrhea. He is taking a probiotic.  Patient will leave for vacation on the 24th of this month.  Review of Systems  HENT: Positive for congestion and rhinorrhea.   Respiratory: Positive for cough.   Gastrointestinal: Negative for abdominal pain and diarrhea.       Objective:   Physical Exam CONSTITUTIONAL: Well developed/well nourished HEAD: Normocephalic/atraumatic EYES: EOM/PERRL ENMT: Mucous membranes moist NECK: supple no meningeal signs SPINE: entire spine nontender CV: S1/S2 noted, no murmurs/rubs/gallops noted LUNGS: Few rales in his left chest. ABDOMEN: soft, nontender, no rebound or guarding GU: no cva tenderness NEURO: Pt is awake/alert, moves all extremitiesx4 EXTREMITIES: pulses normal, full ROM SKIN: warm, color normal PSYCH: no abnormalities of mood noted        Assessment & Plan:   This chart was scribed in my presence and reviewed by me personally.    ICD-9-CM ICD-10-CM   1. Acute bronchitis due to other specified organisms 466.0 J20.8 predniSONE (DELTASONE) 20 MG tablet  2. IBS (irritable bowel syndrome) 564.1 K58.9 hyoscyamine (LEVSIN/SL) 0.125 MG SL tablet     Signed, Robyn Haber, MD

## 2014-06-29 DIAGNOSIS — K4090 Unilateral inguinal hernia, without obstruction or gangrene, not specified as recurrent: Secondary | ICD-10-CM | POA: Diagnosis not present

## 2014-06-29 DIAGNOSIS — E785 Hyperlipidemia, unspecified: Secondary | ICD-10-CM | POA: Diagnosis not present

## 2014-06-29 DIAGNOSIS — Z1211 Encounter for screening for malignant neoplasm of colon: Secondary | ICD-10-CM | POA: Diagnosis not present

## 2014-06-29 DIAGNOSIS — I10 Essential (primary) hypertension: Secondary | ICD-10-CM | POA: Diagnosis not present

## 2014-06-29 DIAGNOSIS — N433 Hydrocele, unspecified: Secondary | ICD-10-CM | POA: Diagnosis not present

## 2014-06-29 DIAGNOSIS — N529 Male erectile dysfunction, unspecified: Secondary | ICD-10-CM | POA: Diagnosis not present

## 2014-06-29 DIAGNOSIS — Z0000 Encounter for general adult medical examination without abnormal findings: Secondary | ICD-10-CM | POA: Diagnosis not present

## 2014-07-07 DIAGNOSIS — Z1211 Encounter for screening for malignant neoplasm of colon: Secondary | ICD-10-CM | POA: Diagnosis not present

## 2014-07-28 ENCOUNTER — Other Ambulatory Visit: Payer: Self-pay | Admitting: Family Medicine

## 2014-07-28 DIAGNOSIS — E038 Other specified hypothyroidism: Secondary | ICD-10-CM

## 2014-07-28 MED ORDER — LEVOTHYROXINE SODIUM 100 MCG PO TABS: 100.0000 ug | ORAL_TABLET | Freq: Every day | ORAL | Status: DC

## 2014-07-29 ENCOUNTER — Emergency Department
Admission: EM | Admit: 2014-07-29 | Discharge: 2014-07-29 | Disposition: A | Discharge status: Home / Self Care | Payer: Medicare Other | Attending: Emergency Medicine | Admitting: Emergency Medicine

## 2014-07-29 ENCOUNTER — Emergency Department: Payer: Medicare Other

## 2014-07-29 DIAGNOSIS — K5900 Constipation, unspecified: Secondary | ICD-10-CM | POA: Insufficient documentation

## 2014-07-29 DIAGNOSIS — E859 Amyloidosis, unspecified: Secondary | ICD-10-CM | POA: Insufficient documentation

## 2014-07-29 DIAGNOSIS — I509 Heart failure, unspecified: Secondary | ICD-10-CM | POA: Insufficient documentation

## 2014-07-29 DIAGNOSIS — Z79899 Other long term (current) drug therapy: Secondary | ICD-10-CM | POA: Insufficient documentation

## 2014-07-29 DIAGNOSIS — J45909 Unspecified asthma, uncomplicated: Secondary | ICD-10-CM | POA: Insufficient documentation

## 2014-07-29 HISTORY — DX: Malignant (primary) neoplasm, unspecified: C80.1

## 2014-07-29 MED ORDER — MAGNESIUM CITRATE 1.745 GM/30ML PO SOLN
5.0000 mL/kg | Freq: Once | ORAL | Status: DC
Start: 2014-07-29 — End: 2014-07-29
  Filled 2014-07-29: qty 296

## 2014-07-29 MED ORDER — POLYETHYLENE GLYCOL 3350 17 GM/SCOOP PO POWD
17.0000 g | Freq: Every day | ORAL | Status: DC | PRN
Start: 2014-07-29 — End: 2016-04-17

## 2014-07-29 NOTE — Discharge Instructions (Signed)
Constipation    You have been diagnosed with constipation.    Constipation is one of the most common problems people have. It is especially common for those over age 67.    Constipation is caused by a few things. This includes not enough fiber in the diet. It also includes not enough regular exercise. Hypothyroidism is another possible cause. Medicines (especially narcotic pain medicines like oxycodone (Percocet), hydrocodone (Vicodin), propoxyphene (Darvocet), and codeine (Tylenol #3) can cause it. Rarely, constipation can be a symptom in colon cancer. You should follow up with your doctor.    Typical constipation symptoms are less bowel movements than usual and small hard stool. Sometimes, diarrhea happens when stool leaks over and around a blockage of hard stool. This is called "overflow incontinence."    There are many treatments for constipation. These include having more fiber in your diet. Fruits, vegetables and foods with bran are rich in fiber. One cup of bran cereal a day usually gets you the daily dietary fiber and fluids you need. A teaspoon (5 ml) of Metamucil with each meal usually works as well.    You may also need suppositories, enemas and/or pills for your constipation. Use these only after talking about them with your doctor.    YOU SHOULD SEEK MEDICAL ATTENTION IMMEDIATELY, EITHER HERE OR AT THE NEAREST EMERGENCY DEPARTMENT, IF ANY OF THE FOLLOWING OCCURS:   Vomiting.   Fever (temperature higher than 100.4F / 38C).   Bloody or black, tarry stool.   Abdominal (belly) pain that is new, worse or that does not get better in the next 24 hours.   Cannot have a bowel movement even with laxatives, enemas and/or suppositories used as directed.   Worsening in any way or any concerns.

## 2014-07-29 NOTE — ED Provider Notes (Signed)
EMERGENCY DEPARTMENT PHYSICIAN ATTESTATION OF APP NOTE     Date: 07/29/2014  Patient Name: Gregory [name]  Physician Contact: 8:15 PM       HPI:  69 y.o. male presenting to the ED for constipation.     On exam: Soft abdomen.    Impression & Plan: Enema. Miralax. Oral hydration.      _______________________________      Attestations:  This note is prepared by [place] acting as Education administrator for [employer] Earley Favor, MD.    The scribe's documentation has been prepared under my direction and personally reviewed by me in its entirety.  I confirm that the note above accurately reflects all work, treatment, procedures, and medical decision making performed by me.                                             _______________________________    I, Dr. Earley Favor, Lolly Mustache, MD, personally saw and examined the patient with the advanced practice provider.      Jimmye Norman, MD  07/30/14 [phone]

## 2014-07-29 NOTE — ED Notes (Signed)
+   constipation.  Last BM 5 days ago.  Denies abdominal and back pain.  Unable to push stool out.  Has not taken stool softners or laxatives.

## 2014-07-29 NOTE — ED Provider Notes (Signed)
[facility] Emergency Department   HISTORY AND PHYSICAL EXAM     Physician/Midlevel provider first contact with patient: 07/29/14 1828       Date Time: 07/29/2014 8:45 PM  Patient Name: Gregory [name]  Attending Physician: Jimmye Norman, MD  Mid-Level: Starla Link, PA-C    History of Presenting Illness:   Gregory [name] is a 69 y.o. male     Chief Complaint: constipation   History obtained from: Patient.  Onset/Duration: last week  Quality: Painful  Severity: severe  Aggravating Factors: none  Alleviating Factors: better with BM  Associated Symptoms: Abd pain.  Denies vomiting  Narrative/Additional Historical Findings:68 y.o. male BIB wife with history of amyloidosis and chemo tx (4 treatments since Dec 2015) presents with constipation that started 2 hours ago with hx of similar sxs (3 episodes) starting approx a few weeks ago. Pt reports last enema one year ago. Diet: no sodium, eats vegetables. Pt is not on pain rx. Saw [place] ED yesterday.   Dr. Elinor Parkinson for chemo for amyloidosis (dxd 05/2014)    PCP: Otto Herb, MD   Dr. Lia Foyer PCP     Past Medical History:     Past Medical History   Diagnosis Date   . Asthma without status asthmaticus      childhood   . Arthritis      bilat shoulders, lt knee   . Fracture of unspecified bones      1974 lt shoulder   . CHF (congestive heart failure)    . Malignant neoplasm    . Amyloidosis        Past Surgical History:     Past Surgical History   Procedure Laterality Date   . Colonoscopy     . Excision, ganglion  12/19/2012     Procedure: EXCISION, GANGLION;  Surgeon: Jillyn Hidden, MD;  Location: [facility];  Service: Orthopedics;  Laterality: Right;  injection right and left shoulder   . Injection, medication  12/19/2012     Procedure: INJECTION, MEDICATION;  Surgeon: Jillyn Hidden, MD;  Location: [facility];  Service: Orthopedics;  Laterality: Bilateral;   . Hernia repair       lt inguinal age 51   . Arthroplasty, shoulder, total  06/18/2013      Procedure: ARTHROPLASTY, SHOULDER, TOTAL;  Surgeon: Wendi Snipes, MD;  Location: [facility];  Service: Orthopedics;  Laterality: Left;       Family History:     Family History   Problem Relation Age of Onset   . Family history unknown: Yes       Social History:     History     Social History   . Marital Status: Married     Spouse Name: N/A     Number of Children: N/A   . Years of Education: N/A     Social History Main Topics   . Smoking status: Never Smoker    . Smokeless tobacco: Never Used   . Alcohol Use: No   . Drug Use: No   . Sexual Activity: Not on file     Other Topics Concern   . Not on file     Social History Narrative       Allergies:   No Known Allergies    Medications:     Discharge Medication List as of 07/29/2014  8:17 PM      START taking these medications    Details  polyethylene glycol (MIRALAX) powder Take 17 g by mouth daily as needed (as needed for constipation). 0.8 g/kg/day  MAX 17 g/day, Starting 07/29/2014, Until Discontinued, Print         CONTINUE these medications which have NOT CHANGED    Details   dexamethasone (DECADRON) 4 MG tablet Take 4 mg by mouth., Until Discontinued, Historical Med      fluticasone (FLOVENT DISKUS) 50 MCG/BLIST diskus inhaler Inhale into the lungs daily as needed., Until Discontinued, Historical Med      sulfamethoxazole-trimethoprim (BACTRIM DS,SEPTRA DS) 800-160 MG per tablet Take 1 tablet by mouth. Mondays-Wednesdays-Fridays, Until Discontinued, Historical Med      torsemide (DEMADEX) 20 MG tablet Take 40 mg by mouth daily., Until Discontinued, Historical Med             Review of Systems:     Review of Systems   Constitutional: Negative for fever.   Eyes: Negative for redness.   Respiratory: Negative for cough and stridor.    Cardiovascular: Negative for chest pain.   Gastrointestinal: Positive for abdominal pain and constipation. Negative for nausea and vomiting.   Genitourinary: Negative for dysuria.   Musculoskeletal: Negative for back pain,  falls and neck pain.   Skin: Negative.    Neurological: Negative for dizziness, loss of consciousness and headaches.       Physical Exam:     Filed Vitals:    07/29/14 1819 07/29/14 2027 07/29/14 2036   BP: _0   Pulse: 118 114 113   Temp: 97.8 F (36.6 C)     Resp: 16 12    Height: _1  (1.676 m)     Weight: 61.236 kg     SpO2: 96% 98% 97%     Pulse Oximetry Analysis - Normal 96% on RA    Physical Exam   Constitutional: He is oriented to person, place, and time and well-developed, well-nourished, and in no distress. No distress.   HENT:   Head: Normocephalic and atraumatic.   Right Ear: External ear normal.   Left Ear: External ear normal.   Nose: Nose normal.   Mouth/Throat: Oropharynx is clear and moist.   Eyes: Conjunctivae are normal. Pupils are equal, round, and reactive to light. Right eye exhibits no discharge. Left eye exhibits no discharge. No scleral icterus.   Neck: Normal range of motion. Neck supple.   No midline ttp.   Cardiovascular: Normal rate, regular rhythm, normal heart sounds and intact distal pulses.    Pulmonary/Chest: Effort normal and breath sounds normal. No stridor. No respiratory distress. He has no wheezes. He has no rales.   Abdominal: Soft. Bowel sounds are normal. He exhibits no distension. There is no tenderness. There is no rebound, no guarding and no CVA tenderness.   Genitourinary:   Rectal exam performed. Hard brown stool pellets. Pt unable to tolerate disimpaction.    Musculoskeletal: Normal range of motion.   B/l ankle edema mild     Neurological: He is alert and oriented to person, place, and time. Gait normal. GCS score is 15.   Skin: Skin is warm and dry. He is not diaphoretic.   Examined where exposed.   Psychiatric: Affect normal.   Nursing note and vitals reviewed.      Labs:     Results     ** No results found for the last 24 hours. **          Rads:     Radiology Results (24 Hour)     **  No results found for the last 24 hours. **          Procedures:    n/a    Assessment/Plan:     1. Constipation, unspecified constipation type    -secondary to chemotherapy, decreased fluid intake due to CHF  -advised to start miralax and metamucil  -f/u with pcp  -hypotension and tachycardia at baseline for patient    D/w Jimmye Norman, MD      ED Course:     6:33 PM - Discussed plan in ED with pt including rectal exam/fecal disimpaction.    6:46 PM - Disimpaction started. Scribe (male) present. Pt agrees to enema. Pt unable to tolerate manual disimpaction.     8:17 PM - Pt had large BM.     8:28 PM - Pt vitals BP 88/57, pulse 114. Pt states he is in no pain and is normally tachycardic. Papers from 07/26/2014 and 07/28/2014 from [facility] shows pulse at 114 and 123, BP at 96/68 and 115/71. Will check orthostatics. Pt offered PO fluids. Torsemide dose is calculated by pt weight, last dose today 40 mg.     8:36 PM - Pt orthostatic vitals reviewed. Pt states he is in no pain, denies dizziness, near syncope. Pt requesting to go home.     Discharge instructions were provided. Patient was asked to return to the emergency department immediately for any new or concerning symptoms or if they get worse. Additional verbal discharge instructions were given and discussed with the patient. All questions answered.           This note is prepared by Loma Sousa, acting as Scribe for Starla Link, PA-C    Starla Link, [place],  MD: The scribe's documentation has been prepared under my direction and personally reviewed by me in its entirety.  I confirm that the note above accurately reflects all work, treatment, procedures, and medical decision making performed by me.          CHART OWNERSHIP: I, Starla Link, PA-C, am the primary clinician of record.    Signed by: Starla Link, PA-C            Gregory [name] [place], [place]  07/29/14 2101

## 2014-08-12 DIAGNOSIS — H21233 Degeneration of iris (pigmentary), bilateral: Secondary | ICD-10-CM | POA: Diagnosis not present

## 2014-08-12 DIAGNOSIS — H40013 Open angle with borderline findings, low risk, bilateral: Secondary | ICD-10-CM | POA: Diagnosis not present

## 2014-08-26 ENCOUNTER — Emergency Department: Payer: Medicare Other

## 2014-08-26 ENCOUNTER — Emergency Department
Admission: EM | Admit: 2014-08-26 | Discharge: 2014-08-26 | Disposition: A | Discharge status: Other Acute Inpatient Hospital | Payer: Medicare Other | Attending: Emergency Medicine | Admitting: Emergency Medicine

## 2014-08-26 DIAGNOSIS — J690 Pneumonitis due to inhalation of food and vomit: Secondary | ICD-10-CM | POA: Diagnosis not present

## 2014-08-26 DIAGNOSIS — E860 Dehydration: Secondary | ICD-10-CM | POA: Insufficient documentation

## 2014-08-26 DIAGNOSIS — Z23 Encounter for immunization: Secondary | ICD-10-CM | POA: Insufficient documentation

## 2014-08-26 DIAGNOSIS — S01511A Laceration without foreign body of lip, initial encounter: Secondary | ICD-10-CM | POA: Insufficient documentation

## 2014-08-26 DIAGNOSIS — W1830XA Fall on same level, unspecified, initial encounter: Secondary | ICD-10-CM | POA: Insufficient documentation

## 2014-08-26 DIAGNOSIS — I509 Heart failure, unspecified: Secondary | ICD-10-CM | POA: Insufficient documentation

## 2014-08-26 DIAGNOSIS — Y92009 Unspecified place in unspecified non-institutional (private) residence as the place of occurrence of the external cause: Secondary | ICD-10-CM | POA: Insufficient documentation

## 2014-08-26 DIAGNOSIS — R778 Other specified abnormalities of plasma proteins: Secondary | ICD-10-CM

## 2014-08-26 DIAGNOSIS — R748 Abnormal levels of other serum enzymes: Secondary | ICD-10-CM | POA: Insufficient documentation

## 2014-08-26 DIAGNOSIS — N289 Disorder of kidney and ureter, unspecified: Secondary | ICD-10-CM | POA: Insufficient documentation

## 2014-08-26 DIAGNOSIS — Z8679 Personal history of other diseases of the circulatory system: Secondary | ICD-10-CM | POA: Insufficient documentation

## 2014-08-26 DIAGNOSIS — R55 Syncope and collapse: Secondary | ICD-10-CM

## 2014-08-26 DIAGNOSIS — R05 Cough: Secondary | ICD-10-CM | POA: Diagnosis not present

## 2014-08-26 LAB — CBC AND DIFFERENTIAL
Basophils Absolute Automated: 0.01 10*3/uL (ref 0.00–0.20)
Basophils Automated: 0 %
Eosinophils Absolute Automated: 0.01 10*3/uL (ref 0.00–0.70)
Eosinophils Automated: 0 %
Hematocrit: 28.4 % — ABNORMAL LOW (ref 42.0–52.0)
Hgb: 9.4 g/dL — ABNORMAL LOW (ref 13.0–17.0)
Immature Granulocytes Absolute: 0.04 10*3/uL
Immature Granulocytes: 1 %
Lymphocytes Absolute Automated: 1.51 10*3/uL (ref 0.50–4.40)
Lymphocytes Automated: 33 %
MCH: 34.3 pg — ABNORMAL HIGH (ref 28.0–32.0)
MCHC: 33.1 g/dL (ref 32.0–36.0)
MCV: 103.6 fL — ABNORMAL HIGH (ref 80.0–100.0)
MPV: 11.2 fL (ref 9.4–12.3)
Monocytes Absolute Automated: 0.54 10*3/uL (ref 0.00–1.20)
Monocytes: 12 %
Neutrophils Absolute: 2.47 10*3/uL (ref 1.80–8.10)
Neutrophils: 54 %
Nucleated RBC: 0 /100 WBC (ref 0–1)
Platelets: 127 10*3/uL — ABNORMAL LOW (ref 140–400)
RBC: 2.74 10*6/uL — ABNORMAL LOW (ref 4.70–6.00)
RDW: 16 % — ABNORMAL HIGH (ref 12–15)
WBC: 4.54 10*3/uL (ref 3.50–10.80)

## 2014-08-26 LAB — HEMOLYSIS INDEX: Hemolysis Index: 7 (ref 0–18)

## 2014-08-26 LAB — COMPREHENSIVE METABOLIC PANEL
ALT: 37 U/L (ref 0–55)
AST (SGOT): 40 U/L — ABNORMAL HIGH (ref 5–34)
Albumin/Globulin Ratio: 1.7 (ref 0.9–2.2)
Albumin: 3.5 g/dL (ref 3.5–5.0)
Alkaline Phosphatase: 125 U/L — ABNORMAL HIGH (ref 38–106)
Anion Gap: 14 (ref 5.0–15.0)
BUN: 45 mg/dL — ABNORMAL HIGH (ref 9.0–28.0)
Bilirubin, Total: 1.4 mg/dL — ABNORMAL HIGH (ref 0.2–1.2)
CO2: 26 mEq/L (ref 22–29)
Calcium: 9.4 mg/dL (ref 8.5–10.5)
Chloride: 94 mEq/L — ABNORMAL LOW (ref 100–111)
Creatinine: 3.3 mg/dL — ABNORMAL HIGH (ref 0.7–1.3)
Globulin: 2.1 g/dL (ref 2.0–3.6)
Glucose: 135 mg/dL — ABNORMAL HIGH (ref 70–100)
Potassium: 3.8 mEq/L (ref 3.5–5.1)
Protein, Total: 5.6 g/dL — ABNORMAL LOW (ref 6.0–8.3)
Sodium: 134 mEq/L — ABNORMAL LOW (ref 136–145)

## 2014-08-26 LAB — TROPONIN I: Troponin I: 0.17 ng/mL (ref 0.00–0.09)

## 2014-08-26 LAB — B-TYPE NATRIURETIC PEPTIDE: B-Natriuretic Peptide: 1378 pg/mL — ABNORMAL HIGH (ref 0–100)

## 2014-08-26 LAB — DIGOXIN LEVEL: Digoxin Level: <0.3 ng/mL — ABNORMAL LOW (ref 0.5–2.0)

## 2014-08-26 LAB — GFR: EGFR: 22.7

## 2014-08-26 MED ORDER — AMOXICILLIN-POT CLAVULANATE 875-125 MG PO TABS
1.0000 | ORAL_TABLET | Freq: Once | ORAL | Status: AC
Start: 2014-08-26 — End: 2014-08-26
  Administered 2014-08-26: 1 via ORAL
  Filled 2014-08-26: qty 1

## 2014-08-26 MED ORDER — SODIUM CHLORIDE 0.9 % IV SOLN
INTRAVENOUS | Status: DC
Start: 2014-08-26 — End: 2014-08-26

## 2014-08-26 MED ORDER — DERMABOND (TOPICAL ADHESIVE)
Freq: Once | Status: AC
Start: 2014-08-26 — End: 2014-08-26
  Filled 2014-08-26: qty 1

## 2014-08-26 MED ORDER — SODIUM CHLORIDE 0.9 % IV BOLUS
250.0000 mL | Freq: Once | INTRAVENOUS | Status: DC
Start: 2014-08-26 — End: 2014-08-26

## 2014-08-26 MED ORDER — TETANUS-DIPHTH-ACELL PERTUSSIS 5-2.5-18.5 LF-MCG/0.5 IM SUSP
0.5000 mL | Freq: Once | INTRAMUSCULAR | Status: AC
Start: 2014-08-26 — End: 2014-08-26
  Administered 2014-08-26: 0.5 mL via INTRAMUSCULAR
  Filled 2014-08-26: qty 0.5

## 2014-08-26 MED ORDER — AMOXICILLIN-POT CLAVULANATE 875-125 MG PO TABS
1.0000 | ORAL_TABLET | Freq: Two times a day (BID) | ORAL | Status: DC
Start: 2014-08-26 — End: 2014-08-26

## 2014-08-26 NOTE — ED Notes (Signed)
Bed: BL16  Expected date:   Expected time:   Means of arrival:   Comments:  [id number]

## 2014-08-26 NOTE — ED Notes (Signed)
Pt was brought in by ambulance do to a syncopal episode at home this am. The pt states he got up to go to the bathroom when he felt dizzy, he sat down on the bed and then stood up again. The next thing he remembered was EMS at bedside. Pt isnt clear what he hit but does have a goose bump on his head and a split lip.

## 2014-08-26 NOTE — Discharge Instructions (Signed)
Please see your doctor as soon as possible. Please talk to you cardiologist before taking any more diuretics such as torsemide.    Return to the ED if you change your mind.

## 2014-08-26 NOTE — ED Provider Notes (Signed)
EMERGENCY DEPARTMENT HISTORY AND PHYSICAL EXAM     Physician/Midlevel provider first contact with patient: 08/26/14 0915         Date: 08/26/2014  Patient Name: Gregory [name]    History of Presenting Illness     Chief Complaint   Patient presents with   . Fall   . Dizziness     History Provided By: Patient    Chief Complaint: Syncope  Onset: Immediately PTA  Timing: Resolved  Severity: 1 Episode  Modifying Factors: None.  Associated Symptoms: Lightheadedness, lip laceration    Additional History: Gregory [name] is a 69 y.o. male BIBA P/W 1 syncopal episode that happened immediately PTA. Pt states that he woke up this morning to go to the bathroom upstairs, he got up the stairs and felt lightheaded after he got to the top. Pt says he laid down for a little bit and then got back up quickly (states he jumped up out of bed) as he had to go urinate and then passed out. Pt states that he woke up when EMS said "Sir". Pt thinks he fell forwards because he has a small lip laceration. Pt denies HA, teeth pain, neck pain, chest pain, adb pain, weakness, numbness, tingling, or change in appetite. Pt is not on blood thinners. He does not having a change in medications recently. No modifying factors.    States he went to see his specialist at [facility] yesterday where he had an "extra boost" of medicine given IV (?diuretic) and was told to double his diuretic dose last night which he did for CHF.    PCP: Otto Herb, MD      No current facility-administered medications for this encounter.     Current Outpatient Prescriptions   Medication Sig Dispense Refill   . carvedilol (COREG) 3.125 MG tablet Take 3.125 mg by mouth.     . digoxin (LANOXIN) 0.125 MG tablet Take 125 mcg by mouth.     . furosemide (LASIX) 40 MG tablet Take 40 mg by mouth 2 (two) times daily.     [name] lisinopril (PRINIVIL,ZESTRIL) 2.5 MG tablet Take 2.5 mg by mouth daily.     [name] dexamethasone (DECADRON) 4 MG tablet Take 4 mg by mouth.     . fluticasone (FLOVENT  DISKUS) 50 MCG/BLIST diskus inhaler Inhale into the lungs daily as needed.     [name] KLOR-CON M 10 MEQ tablet      . polyethylene glycol (MIRALAX) powder Take 17 g by mouth daily as needed (as needed for constipation). 0.8 g/kg/day  MAX 17 g/day 250 g 0   . sulfamethoxazole-trimethoprim (BACTRIM DS,SEPTRA DS) 800-160 MG per tablet Take 1 tablet by mouth. Mondays-Wednesdays-Fridays     . torsemide (DEMADEX) 20 MG tablet Take 40 mg by mouth daily.         Past History     Past Medical History:  Past Medical History   Diagnosis Date   . Asthma without status asthmaticus      childhood   . Arthritis      bilat shoulders, lt knee   . Fracture of unspecified bones      1974 lt shoulder   . CHF (congestive heart failure)    . Malignant neoplasm    . Amyloidosis        Past Surgical History:  Past Surgical History   Procedure Laterality Date   . Colonoscopy     . Excision, ganglion  12/19/2012     Procedure:  EXCISION, GANGLION;  Surgeon: Jillyn Hidden, MD;  Location: [facility];  Service: Orthopedics;  Laterality: Right;  injection right and left shoulder   . Injection, medication  12/19/2012     Procedure: INJECTION, MEDICATION;  Surgeon: Jillyn Hidden, MD;  Location: [facility];  Service: Orthopedics;  Laterality: Bilateral;   . Hernia repair       lt inguinal age 27   . Arthroplasty, shoulder, total  06/18/2013     Procedure: ARTHROPLASTY, SHOULDER, TOTAL;  Surgeon: Wendi Snipes, MD;  Location[facility];  Service: Orthopedics;  Laterality: Left;       Family History:  Family History   Problem Relation Age of Onset   . Family history unknown: Yes       Social History:  History   Substance Use Topics   . Smoking status: Never Smoker    . Smokeless tobacco: Never Used   . Alcohol Use: No       Allergies:  No Known Allergies    Review of Systems     Review of Systems   Constitutional: Negative for fever and appetite change.   HENT:        +Lip laceration  -Teeth pain   Respiratory: Negative for shortness of  breath.    Cardiovascular: Negative for chest pain.   Gastrointestinal: Negative for abdominal pain.   Musculoskeletal: Negative for neck pain.   Neurological: Positive for syncope and light-headedness. Negative for weakness, numbness and headaches.   All other systems reviewed and are negative.         Physical Exam   BP 89/60 mmHg  Pulse 102  Temp(Src) 98.5 F (36.9 C) (Oral)  Resp 20  Ht 5' 7.2" (1.707 m)  Wt 63.504 kg  BMI 21.79 kg/m2  SpO2 100%    Physical Exam   Constitutional: Patient is oriented to person, place, and time and well-developed, well-nourished, and in no distress.   Head: Normocephalic and atraumatic. Dried blood on face  Eyes: EOM are normal. Pupils are equal, round, and reactive to light. pink sunconj. No scleral icterus  ENT: OP clear, dry MM. Normal dentition without laxity or fx. Lower inner lip lac 1.5 cm. Anterior chin lac < 0.5 cm without foreign body or active bleeding  Neck: Normal range of motion. Neck supple. No JVD. No Cspine TTP  Cardiovascular: Normal rate and regular rhythm. No murmurs or rubs.  Pulmonary/Chest: Effort normal and breath sounds normal. No respiratory distress.   Abdominal: Soft. There is no tenderness. No rebound or guarding.  Musculoskeletal: Normal range of motion. No lower extremity edema.  Neurological: Patient is alert and oriented to person, place, and time. GCS score is 15. MAE. CN 2-12 intact. Normal speech  Skin: Skin is warm and dry.         Diagnostic Study Results     Labs -     Results     Procedure Component Value Units Date/Time    Digoxin level [[id number]]  (Abnormal) Collected:  08/26/14 0940    Specimen Information:  Blood Updated:  08/26/14 1129     Digoxin Level <0.3 (L) ng/mL      Digoxin Date of Last Dose UNK      Digoxin Time of Last Dose UNK     Narrative:      Reason for not entering the date/time of last dose:->Unknown  Last dose    Troponin I [[id number]]  (Abnormal) Collected:  08/26/14 0940  Specimen Information:  Blood  Updated:  08/26/14 1033     Troponin I 0.17 (HH) ng/mL     B-type Natriuretic Peptide [[id number]]  (Abnormal) Collected:  08/26/14 0940    Specimen Information:  Blood Updated:  08/26/14 1024     B-Natriuretic Peptide 1378 (H) pg/mL     Comprehensive metabolic panel [[id number]]  (Abnormal) Collected:  08/26/14 0940    Specimen Information:  Blood Updated:  08/26/14 1020     Glucose 135 (H) mg/dL      BUN 45.0 (H) mg/dL      Creatinine 3.3 (H) mg/dL      Sodium 134 (L) mEq/L      Potassium 3.8 mEq/L      Chloride 94 (L) mEq/L      CO2 26 mEq/L      CALCIUM 9.4 mg/dL      Protein, Total 5.6 (L) g/dL      Albumin 3.5 g/dL      AST (SGOT) 40 (H) U/L      ALT 37 U/L      Alkaline Phosphatase 125 (H) U/L      Bilirubin, Total 1.4 (H) mg/dL      Globulin 2.1 g/dL      Albumin/Globulin Ratio 1.7      Anion Gap 14.0     Hemolysis index [[id number]] Collected:  08/26/14 0940     Hemolysis Index 7 Updated:  08/26/14 1020    GFR [[id number]] Collected:  08/26/14 0940     EGFR 22.7 Updated:  08/26/14 1020    CBC with differential [[id number]]  (Abnormal) Collected:  08/26/14 0940    Specimen Information:  Blood / Blood Updated:  08/26/14 1011     WBC 4.54 x10 3/uL      Hgb 9.4 (L) g/dL      Hematocrit 28.4 (L) %      Platelets 127 (L) x10 3/uL      RBC 2.74 (L) x10 6/uL      MCV 103.6 (H) fL      MCH 34.3 (H) pg      MCHC 33.1 g/dL      RDW 16 (H) %      MPV 11.2 fL      Neutrophils 54 %      Lymphocytes Automated 33 %      Monocytes 12 %      Eosinophils Automated 0 %      Basophils Automated 0 %      Immature Granulocyte 1 %      Nucleated RBC 0 /100 WBC      Neutrophils Absolute 2.47 x10 3/uL      Abs Lymph Automated 1.51 x10 3/uL      Abs Mono Automated 0.54 x10 3/uL      Abs Eos Automated 0.01 x10 3/uL      Absolute Baso Automated 0.01 x10 3/uL      Absolute Immature Granulocyte 0.04 x10 3/uL           Radiologic Studies -   Radiology Results (24 Hour)     Procedure Component Value Units Date/Time    CT Head WO Contrast  [[id number]] Collected:  08/26/14 1026    Order Status:  Completed Updated:  08/26/14 1033    Narrative:      INDICATION: Head trauma    TECHNIQUE: Multiple transaxial images of the head were obtained. No  intravenous contrast was administered.    FINDINGS: No intracranial mass or hemorrhage is  demonstrated. There is  an old infarct involving the right parietal lobe. Ventricular system and  subarachnoid spaces are within normal limits. The visualized portions of  the sinuses and mastoid air cells are clear.    There is prominent soft tissue swelling overlying the right temporal  bone.      Impression:       No intracranial hemorrhage. 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 Soft tissue swelling overlying the right temporal bone.    Steward Drone, MD   08/26/2014 10:29 AM      XR Chest  AP Portable [[id number]] Collected:  08/26/14 1017    Order Status:  Completed Updated:  08/26/14 1022    Narrative:      Frontal chest x-ray    Indication: Syncope    Comparison: 04/21/2014    Findings: Cardio mediastinal silhouette is stable. There is no  pneumothorax. Lungs are clear. No pneumonia, vascular congestion, or  pleural effusion.         Impression:      Impression: No acute cardiopulmonary findings.    Golden Hurter, MD   08/26/2014 10:18 AM        .      Medical Decision Making   I am the first provider for this patient.    I reviewed the vital signs, available nursing notes, past medical history, past surgical history, family history and social history.    Vital Signs-Reviewed the patient's vital signs.     Patient Vitals for the past 12 hrs:   BP Temp Pulse Resp   08/26/14 1641 - 98.5 F (36.9 C) (!) 102 20   08/26/14 1429 (!) 89/60 mmHg - (!) 107 18   08/26/14 1131 96/65 mmHg 98 F (36.7 C) 100 20   08/26/14 1033 (!) 89/63 mmHg 98 F (36.7 C) 99 16   08/26/14 1000 - - 78 -   08/26/14 0913 104/61 mmHg - 100 18       Pulse Oximetry Analysis - Normal 100% on RA.      EKG:  Interpreted by the EP.   Time  Interpreted: 907   Rate: 100   Rhythm: Normal Sinus Rhythm    Interpretation: Low voltage QRS, LAFB, Q wave interiorly in III and AVF. No ST elevation or depression.   Comparison: No change from Apr 21, 2014    Old Medical Records: Old medical records. Nursing records.  Pt seen in February for congestion. Admitted November 2015 for dizziness, elevated creatinine of 2, BP low, was given IV fluids. Troponin slightly elevated at 0.1. Hx of CHF.     ED Course: 10:39 AM - Updated pt on results of labs. Recommended admission for observation of kidney function and dehydration. Pt agrees with plan but wants to be admitted at [facility]. pt is hesitant to receive any IV fluids before consulting his doctor.    11:32 AM - Still waiting consult from [facility]. Pt wants to leave AMA.    11:36 AM - Pt's friend talked to his doctor. Wants to leave AMA so he can go to [facility] himself. Discussed results. Discussed risk of leaving here without supervision. Pt understands risk and would like to leave.    11:38 AM - D/w pt's [facility] cardiomyopathy clinic NP (works with Dr. Marveen Reeks) doesn't recommend pt driving up to hospital with friend, she gave an operators phone number for transfer to [facility]. Will admit to cardiomyopathy team.    11:41 AM -  Pt's friend is trying to convince pt to stay until transfer is made official.    12:04 PM - D/w Heart Failure doctor on call at [facility], discussed case, will admit pt. Will call back with a bed. States not to give IV fluids until patient is seen by doctor at [facility].    2:55 pm- I called [place] bed board. They state they have not received ED notes even though our ED nurse faxed in 30-45 minutes ago. They request faxing it again to another number and this was done    3:26 PM - Still have not heard back from [facility] regarding bed assignment. Pt wants to leave AMA.    3:29 PM - D/w Bed board, they have a bed and Charge Nurse will call back within 30 minutes with the bed number.  If pt leaves AMA, there are 100+ pt's in the ED at [facility], he will lose his bed.    3:33 PM - Pt and friend frustrated about wait. Will await transport.    4:17 PM - NP at Cardiomyopathy clinic confirms that they do not know about a bed assignment for pt.    4:19 PM - Bed board states that there is no available bed for assignment. Accepting doctor states that it is unlikely that there will be an available bed today due to the amount of patients.    4:20 PM - Pt is now leaving AMA because there is no bed assignment.    4:31 PM - Bed board called back say that they do have a bed assigned.     4:31 PM - Pt now agrees to wait for transport to [facility].          Provider Notes: hx cardiomyopathy, CHF, amyloidosis, here after syncope preceded by lightheadedness when pt stood up, who on exam had soft Bp's, looked dehydrated, with hx incr diuretics yesterday at [facility] cardiology clinic, and with elevated cr. Denies Cp or SOB. ecg unchanged from prior. Pt and Susan Moore cardiologist felt pt could be having CHF exacerb and asked to gold off on IVF at this time until they evaluate him at [facility]. Td updated. From fall, pt has inner lip lac that was sutured due to gape. Will cover with augmentin as likely from teeth        Diagnosis     Clinical Impression:   1. Syncope and collapse    2. Dehydration    3. Acute renal insufficiency    4. History of CHF (congestive heart failure)    5. Lip laceration, initial encounter    6. Elevated troponin        _______________________________    This note is prepared by Garvin Fila acting as Scribe for Burnett Corrente, MD.    Burnett Corrente, MD.  The scribe's documentation has been prepared under my direction and personally reviewed by me in its entirety.  I confirm that the note above accurately reflects all work, treatment, procedures, and medical decision making performed by me.        _______________________________          Burnett Corrente, MD  08/29/14 1143

## 2014-08-26 NOTE — ED Provider Notes (Signed)
[facility]  Date/Time: 08/26/2014 1:46 PM  Performed by: Clovis Pu  Authorized by: Burnett Corrente    Consent:     Consent obtained:  Verbal    Consent given by:  Patient    Risks discussed:  Retained foreign body and poor wound healing    Alternatives discussed:  No treatment  Anesthesia (see MAR for exact dosages):     Anesthesia method:  Local infiltration    Local anesthetic:  Lidocaine 1% w/o epi  Laceration details:     Location:  Lip    Lip location:  Lower interior lip    Length (cm):  1  Repair type:     Repair type:  Simple  Pre-procedure details:     Preparation:  Patient was prepped and draped in usual sterile fashion  Exploration:     Hemostasis achieved with:  Direct pressure    Wound exploration: entire depth of wound probed and visualized      Contaminated: no    Treatment:     Area cleansed with:  Shur-Clens    Amount of cleaning:  Standard    Irrigation volume:  40 cc    Irrigation method:  Pressure wash    Visualized foreign bodies/material removed: no    Skin repair:     Repair method:  Sutures    Suture size:  5-0    Suture material:  Fast-absorbing gut    Number of sutures:  3  Approximation:     Approximation:  Close    Vermilion border: well-aligned    Post-procedure details:     Dressing:  Open (no dressing)    Patient tolerance of procedure:  Tolerated well, no immediate complications    ------------------- PROCEDURE: LACERATION REPAIR  --------------------    Performed by the emergency provider  Consent:  Informed consent, after discussion of the risks, benefits, and alternatives to the procedure, was obtained  Location:  Inferior to lower lip (inferior to vermilion border)  Length: 1 cm  Complexity: Simple  Description: clean wound edges, no foreign bodies  Distal CMS:  Normal.  No deficits.  Neurovascularly intact.  Anesthesia: not required  Preparation: The wound was cleaned with betadine solution and irrigated with shur-clens.  The area was prepped and draped in the usual sterile  fashion.   Exploration:   The wound was explored and no foreign bodies were found.  Procedure: The skin was closed with dermabond.  There was good approximation.  In total, 1 was used.  Post-Procedure:  Good closure and hemostasis.  The patient tolerated the procedure well and there were no complications.  CSM remains intact.  Post procedure dressing applied by RN.          Clovis Pu, [place]  08/26/14 [place], [place]  08/26/14 [phone]

## 2014-08-27 LAB — ECG 12-LEAD
Atrial Rate: 100 {beats}/min
P Axis: 63 degrees
P-R Interval: 144 ms
Q-T Interval: 308 ms
QRS Duration: 82 ms
QTC Calculation (Bezet): 397 ms
R Axis: -48 degrees
T Axis: 100 degrees
Ventricular Rate: 100 {beats}/min

## 2014-09-02 DIAGNOSIS — J690 Pneumonitis due to inhalation of food and vomit: Secondary | ICD-10-CM | POA: Diagnosis not present

## 2014-10-06 ENCOUNTER — Encounter: Payer: Self-pay | Admitting: Family Medicine

## 2014-10-06 ENCOUNTER — Ambulatory Visit (INDEPENDENT_AMBULATORY_CARE_PROVIDER_SITE_OTHER): Payer: Medicare Other | Admitting: Family Medicine

## 2014-10-06 VITALS — BP 162/78 | HR 73 | Temp 97.6°F | Resp 16 | Ht 76.0 in | Wt 203.2 lb

## 2014-10-06 DIAGNOSIS — Z0000 Encounter for general adult medical examination without abnormal findings: Secondary | ICD-10-CM

## 2014-10-06 DIAGNOSIS — K589 Irritable bowel syndrome without diarrhea: Secondary | ICD-10-CM | POA: Diagnosis not present

## 2014-10-06 DIAGNOSIS — E038 Other specified hypothyroidism: Secondary | ICD-10-CM

## 2014-10-06 LAB — POCT URINALYSIS DIPSTICK
Bilirubin, UA: NEGATIVE
Blood, UA: NEGATIVE
Glucose, UA: NEGATIVE
Leukocytes, UA: NEGATIVE
Nitrite, UA: NEGATIVE
Spec Grav, UA: 1.025
Urobilinogen, UA: 1
pH, UA: 5.5

## 2014-10-06 LAB — SEDIMENTATION RATE: Sed Rate: 1 mm/hr (ref 0–20)

## 2014-10-06 MED ORDER — ALPRAZOLAM 0.25 MG PO TABS: 0.2500 mg | ORAL_TABLET | Freq: Two times a day (BID) | ORAL | Status: DC | PRN

## 2014-10-06 MED ORDER — LEVOTHYROXINE SODIUM 100 MCG PO TABS: 100.0000 ug | ORAL_TABLET | Freq: Every day | ORAL | Status: DC

## 2014-10-06 NOTE — Addendum Note (Signed)
Addended by: Tommas Olp B on: 10/06/2014 03:54 PM   Modules accepted: Orders

## 2014-10-06 NOTE — Addendum Note (Signed)
Addended by: Robyn Haber on: 10/06/2014 03:51 PM   Modules accepted: Orders

## 2014-10-06 NOTE — Addendum Note (Signed)
Addended by: Kem Boroughs D on: 10/06/2014 01:50 PM   Modules accepted: Orders

## 2014-10-06 NOTE — Patient Instructions (Signed)
Stop tamsulosin Stop citrucel Stop hyocyamine (Levsin) Take one Bean-o daily Take culturelle as before    Health Maintenance A healthy lifestyle and preventative care can promote health and wellness.  Maintain regular health, dental, and eye exams.  Eat a healthy diet. Foods like vegetables, fruits, whole grains, low-fat dairy products, and lean protein foods contain the nutrients you need and are low in calories. Decrease your intake of foods high in solid fats, added sugars, and salt. Get information about a proper diet from your health care provider, if necessary.  Regular physical exercise is one of the most important things you can do for your health. Most adults should get at least 150 minutes of moderate-intensity exercise (any activity that increases your heart rate and causes you to sweat) each week. In addition, most adults need muscle-strengthening exercises on 2 or more days a week.   Maintain a healthy weight. The body mass index (BMI) is a screening tool to identify possible weight problems. It provides an estimate of body fat based on height and weight. Your health care provider can find your BMI and can help you achieve or maintain a healthy weight. For males 20 years and older:  A BMI below 18.5 is considered underweight.  A BMI of 18.5 to 24.9 is normal.  A BMI of 25 to 29.9 is considered overweight.  A BMI of 30 and above is considered obese.  Maintain normal blood lipids and cholesterol by exercising and minimizing your intake of saturated fat. Eat a balanced diet with plenty of fruits and vegetables. Blood tests for lipids and cholesterol should begin at age 29 and be repeated every 5 years. If your lipid or cholesterol levels are high, you are over age 39, or you are at high risk for heart disease, you may need your cholesterol levels checked more frequently.Ongoing high lipid and cholesterol levels should be treated with medicines if diet and exercise are not  working.  If you smoke, find out from your health care provider how to quit. If you do not use tobacco, do not start.  Lung cancer screening is recommended for adults aged 72-80 years who are at high risk for developing lung cancer because of a history of smoking. A yearly low-dose CT scan of the lungs is recommended for people who have at least a 30-pack-year history of smoking and are current smokers or have quit within the past 15 years. A pack year of smoking is smoking an average of 1 pack of cigarettes a day for 1 year (for example, a 30-pack-year history of smoking could mean smoking 1 pack a day for 30 years or 2 packs a day for 15 years). Yearly screening should continue until the smoker has stopped smoking for at least 15 years. Yearly screening should be stopped for people who develop a health problem that would prevent them from having lung cancer treatment.  If you choose to drink alcohol, do not have more than 2 drinks per day. One drink is considered to be 12 oz (360 mL) of beer, 5 oz (150 mL) of wine, or 1.5 oz (45 mL) of liquor.  Avoid the use of street drugs. Do not share needles with anyone. Ask for help if you need support or instructions about stopping the use of drugs.  High blood pressure causes heart disease and increases the risk of stroke. Blood pressure should be checked at least every 1-2 years. Ongoing high blood pressure should be treated with medicines if weight loss  and exercise are not effective.  If you are 35-2 years old, ask your health care provider if you should take aspirin to prevent heart disease.  Diabetes screening involves taking a blood sample to check your fasting blood sugar level. This should be done once every 3 years after age 56 if you are at a normal weight and without risk factors for diabetes. Testing should be considered at a younger age or be carried out more frequently if you are overweight and have at least 1 risk factor for  diabetes.  Colorectal cancer can be detected and often prevented. Most routine colorectal cancer screening begins at the age of 57 and continues through age 22. However, your health care provider may recommend screening at an earlier age if you have risk factors for colon cancer. On a yearly basis, your health care provider may provide home test kits to check for hidden blood in the stool. A small camera at the end of a tube may be used to directly examine the colon (sigmoidoscopy or colonoscopy) to detect the earliest forms of colorectal cancer. Talk to your health care provider about this at age 20 when routine screening begins. A direct exam of the colon should be repeated every 5-10 years through age 19, unless early forms of precancerous polyps or small growths are found.  People who are at an increased risk for hepatitis B should be screened for this virus. You are considered at high risk for hepatitis B if:  You were born in a country where hepatitis B occurs often. Talk with your health care provider about which countries are considered high risk.  Your parents were born in a high-risk country and you have not received a shot to protect against hepatitis B (hepatitis B vaccine).  You have HIV or AIDS.  You use needles to inject street drugs.  You live with, or have sex with, someone who has hepatitis B.  You are a man who has sex with other men (MSM).  You get hemodialysis treatment.  You take certain medicines for conditions like cancer, organ transplantation, and autoimmune conditions.  Hepatitis C blood testing is recommended for all people born from [birth year] through 1963 and any individual with known risk factors for hepatitis C.  Healthy men should no longer receive prostate-specific antigen (PSA) blood tests as part of routine cancer screening. Talk to your health care provider about prostate cancer screening.  Testicular cancer screening is not recommended for adolescents or  adult males who have no symptoms. Screening includes self-exam, a health care provider exam, and other screening tests. Consult with your health care provider about any symptoms you have or any concerns you have about testicular cancer.  Practice safe sex. Use condoms and avoid high-risk sexual practices to reduce the spread of sexually transmitted infections (STIs).  You should be screened for STIs, including gonorrhea and chlamydia if:  You are sexually active and are younger than 24 years.  You are older than 24 years, and your health care provider tells you that you are at risk for this type of infection.  Your sexual activity has changed since you were last screened, and you are at an increased risk for chlamydia or gonorrhea. Ask your health care provider if you are at risk.  If you are at risk of being infected with HIV, it is recommended that you take a prescription medicine daily to prevent HIV infection. This is called pre-exposure prophylaxis (PrEP). You are considered at risk if:  You are a man who has sex with other men (MSM).  You are a heterosexual man who is sexually active with multiple partners.  You take drugs by injection.  You are sexually active with a partner who has HIV.  Talk with your health care provider about whether you are at high risk of being infected with HIV. If you choose to begin PrEP, you should first be tested for HIV. You should then be tested every 3 months for as long as you are taking PrEP.  Use sunscreen. Apply sunscreen liberally and repeatedly throughout the day. You should seek shade when your shadow is shorter than you. Protect yourself by wearing long sleeves, pants, a wide-brimmed hat, and sunglasses year round whenever you are outdoors.  Tell your health care provider of new moles or changes in moles, especially if there is a change in shape or color. Also, tell your health care provider if a mole is larger than the size of a pencil  eraser.  A one-time screening for abdominal aortic aneurysm (AAA) and surgical repair of large AAAs by ultrasound is recommended for men aged 55-75 years who are current or former smokers.  Stay current with your vaccines (immunizations). Document Released: 11/30/2007 Document Revised: 06/08/2013 Document Reviewed: 10/29/2010 [facility] Patient Information 2015 [place], [place]. This information is not intended to replace advice given to you by your health care provider. Make sure you discuss any questions you have with your health care provider.

## 2014-10-06 NOTE — Progress Notes (Signed)
Subjective:  This chart was scribed for Robyn Haber, MD by Randa Evens, ED Scribe. This patient was seen in room 27 and the patient's care was started at 12:56 PM.   Patient ID: Carlos Choi, male    DOB: 04-May-1946, 69 y.o.   MRN: [id number]  Chief Complaint  Patient presents with   Annual Exam    HPI HPI Comments: Carlos Choi is a 69 y.o. male who presents to the Emergency Department for annual exam. Pt states that he has been having increased bowel movements that's has been worsening for the past 2 weeks. Pt states that he has had 4 bowel movements today. Pt reports increased flatus and belching. Pt states that he was taking probiotics that provided relief but stopped taking it due to starting citrucel. Pt states that he has intermittent loose stools. Pt does report having decreased urine. Pt states that he feels as if he is not emptying his bowels put when using the restroom. Pt states that he notices the increased bowel movements for about 6 weeks after having hemorrhoid surgery. Pt denies blood in stool. Pt states that he is concerned that he may be lactose intolerant but states that his symptoms are worse after eating diary products.   Pt report back pain that began 1 week ago. Pt also reports to the medical aspect of his right foot. Pt report some increased moisture in his ears when waking up.   Pt reports colonoscopy December 10, 2013. Pt states that he has recently had yearly eye exam.    Patient Active Problem List   Diagnosis Date Noted   Adenomatous colon polyp 04/14/2014   Nasal septal ulcer 10/26/2013   Hypothyroid 10/06/2013   Elevated PSA 03/26/2012   Hemorrhoids, internal, with bleeding 06/13/2011   Past Medical History  Diagnosis Date   Hemorrhoids    Rectal bleeding     due to hemorrhoid    Hypertension    Hypothyroid    Past Surgical History  Procedure Laterality Date   Hernia repair      Over ten years ago per patient. He was not  sure of the date.   Rotator cuff repair      both shoulders - patient does not remember the exact date   Not on File Prior to Admission medications   Medication Sig Start Date End Date Taking? Authorizing Provider  ALPRAZolam (XANAX) 0.25 MG tablet Take 1 tablet (0.25 mg total) by mouth 2 (two) times daily as needed for anxiety. 06/20/14  Yes Robyn Haber, MD  aspirin 81 MG tablet Take 81 mg by mouth every other day.     Yes Historical Provider, MD  hyoscyamine (LEVSIN/SL) 0.125 MG SL tablet Place 1 tablet (0.125 mg total) under the tongue every 4 (four) hours as needed. 06/23/14  Yes Robyn Haber, MD  levothyroxine (SYNTHROID, LEVOTHROID) 100 MCG tablet Take 1 tablet (100 mcg total) by mouth daily. 07/28/14  Yes Robyn Haber, MD  loratadine (CLARITIN) 10 MG tablet Take 10 mg by mouth daily.   Yes Historical Provider, MD  Multiple Vitamins-Minerals (CENTRUM SILVER PO) Take 1 tablet by mouth daily.    Yes Historical Provider, MD  Lactobacillus Rhamnosus, GG, (CULTURELLE PO) Take 1 tablet by mouth daily.    Historical Provider, MD  levofloxacin (LEVAQUIN) 500 MG tablet Take 1 tablet (500 mg total) by mouth daily. Patient not taking: Reported on 10/06/2014 06/20/14   Robyn Haber, MD  Omega-3 Fatty Acids (FISH OIL) 1000 MG CAPS  Take 3 capsules by mouth daily.     Historical Provider, MD  predniSONE (DELTASONE) 20 MG tablet Take 2 tablets (40 mg total) by mouth daily. Patient not taking: Reported on 10/06/2014 06/23/14   Robyn Haber, MD  psyllium (REGULOID) 0.52 G capsule Take 0.52 g by mouth every other day.    Historical Provider, MD  tamsulosin (FLOMAX) 0.4 MG CAPS capsule Take 0.4 mg by mouth daily.    Historical Provider, MD   History   Social History   Marital Status: Married    Spouse Name: N/A   Number of Children: N/A   Years of Education: N/A   Occupational History   Not on file.   Social History Main Topics   Smoking status: Former Smoker    Quit date: 06/13/1971     Smokeless tobacco: Never Used   Alcohol Use: Yes     Comment: 2 beers Monthly.   Drug Use: No   Sexual Activity: Not on file   Other Topics Concern   Not on file   Social History Narrative    Review of Systems  Constitutional: Negative for fever and chills.  HENT: Negative for rhinorrhea and sore throat.   Eyes: Negative for visual disturbance.  Respiratory: Negative for cough and shortness of breath.   Cardiovascular: Negative for chest pain.  Gastrointestinal: Negative for nausea, vomiting, abdominal pain, diarrhea, constipation and blood in stool.       Positive for increased bowel movements, flatus and belching.   Genitourinary: Positive for decreased urine volume. Negative for dysuria.  Musculoskeletal: Positive for back pain and arthralgias (right foot. ). Negative for neck pain.  Skin: Negative for rash.  Neurological: Negative for headaches.  Psychiatric/Behavioral: Negative for confusion.     Objective:   BP 162/78 mmHg   Pulse 73   Temp(Src) 97.6 F (36.4 C) (Oral)   Resp 16   Ht 6\' 4"  (1.93 m)   Wt 203 lb 3.2 oz (92.171 kg)   BMI 24.74 kg/m2   SpO2 96%   Physical Exam  Constitutional: He is oriented to person, place, and time. He appears well-developed and well-nourished. No distress.  HENT:  Head: Normocephalic and atraumatic.  Right Ear: External ear normal.  Small inferior white scar left TM  Eyes: Conjunctivae and EOM are normal. Pupils are equal, round, and reactive to light. Right eye exhibits discharge. Left eye exhibits no discharge. No scleral icterus.  Neck: Neck supple. No JVD present. No tracheal deviation present. No thyromegaly present.  Cardiovascular: Normal rate.   Pulmonary/Chest: Effort normal and breath sounds normal. No stridor. No respiratory distress.  Abdominal: Soft. Bowel sounds are normal. He exhibits no mass. There is no tenderness. There is no rebound and no guarding.  Musculoskeletal: Normal range of motion.  Mild kyphosis.    Small bunion right great toe  Lymphadenopathy:    He has no cervical adenopathy.  Neurological: He is alert and oriented to person, place, and time. No cranial nerve deficit. Coordination normal.  Skin: Skin is warm and dry.  Psychiatric: He has a normal mood and affect. His behavior is normal. Judgment and thought content normal.  Nursing note and vitals reviewed.    Assessment & Plan:   This chart was scribed in my presence and reviewed by me personally.    ICD-9-CM ICD-10-CM   1. Annual physical exam V70.0 Z00.00 CBC     POCT urinalysis dipstick     POCT SEDIMENTATION RATE     Lipid panel  COMPLETE METABOLIC PANEL WITH GFR  2. IBS (irritable bowel syndrome) 564.1 K58.9 ALPRAZolam (XANAX) 0.25 MG tablet  3. Other specified hypothyroidism 244.8 E03.8 levothyroxine (SYNTHROID, LEVOTHROID) 100 MCG tablet   Patient to call if IBS symptoms not corrected by Beano, culturelle, and cessation of citrucel tablet  Signed, Robyn Haber, MD

## 2014-10-07 LAB — COMPLETE METABOLIC PANEL WITH GFR
ALT: 11 U/L (ref 0–53)
AST: 18 U/L (ref 0–37)
Albumin: 4.1 g/dL (ref 3.5–5.2)
Alkaline Phosphatase: 60 U/L (ref 39–117)
BUN: 18 mg/dL (ref 6–23)
CO2: 25 mEq/L (ref 19–32)
Calcium: 8.7 mg/dL (ref 8.4–10.5)
Chloride: 106 mEq/L (ref 96–112)
Creat: 0.97 mg/dL (ref 0.50–1.35)
GFR, Est African American: >89 mL/min
GFR, Est Non African American: 80 mL/min
Glucose, Bld: 91 mg/dL (ref 70–99)
Potassium: 5.1 mEq/L (ref 3.5–5.3)
Sodium: 140 mEq/L (ref 135–145)
Total Bilirubin: 1 mg/dL (ref 0.2–1.2)
Total Protein: 6.7 g/dL (ref 6.0–8.3)

## 2014-10-07 LAB — LIPID PANEL
Cholesterol: 204 mg/dL — ABNORMAL HIGH (ref 0–200)
HDL: 38 mg/dL — ABNORMAL LOW (ref >=40)
LDL Cholesterol: 138 mg/dL — ABNORMAL HIGH (ref 0–99)
Total CHOL/HDL Ratio: 5.4 Ratio
Triglycerides: 140 mg/dL (ref <150)
VLDL: 28 mg/dL (ref 0–40)

## 2014-10-07 LAB — CBC
HCT: 49.1 % (ref 39.0–52.0)
Hemoglobin: 17.1 g/dL — ABNORMAL HIGH (ref 13.0–17.0)
MCH: 31.3 pg (ref 26.0–34.0)
MCHC: 34.8 g/dL (ref 30.0–36.0)
MCV: 89.9 fL (ref 78.0–100.0)
MPV: 11.5 fL (ref 8.6–12.4)
Platelets: 174 10*3/uL (ref 150–400)
RBC: 5.46 MIL/uL (ref 4.22–5.81)
RDW: 13.7 % (ref 11.5–15.5)
WBC: 5.2 10*3/uL (ref 4.0–10.5)

## 2014-12-12 ENCOUNTER — Other Ambulatory Visit: Payer: Self-pay

## 2014-12-28 DIAGNOSIS — E785 Hyperlipidemia, unspecified: Secondary | ICD-10-CM | POA: Diagnosis not present

## 2014-12-28 DIAGNOSIS — I10 Essential (primary) hypertension: Secondary | ICD-10-CM | POA: Diagnosis not present

## 2015-01-23 ENCOUNTER — Other Ambulatory Visit: Payer: Self-pay | Admitting: Family Medicine

## 2015-01-23 MED ORDER — HYOSCYAMINE SULFATE 0.125 MG SL SUBL: 0.1250 mg | SUBLINGUAL_TABLET | Freq: Every day | SUBLINGUAL | Status: DC

## 2015-03-10 ENCOUNTER — Ambulatory Visit (INDEPENDENT_AMBULATORY_CARE_PROVIDER_SITE_OTHER): Payer: Medicare Other

## 2015-03-10 ENCOUNTER — Ambulatory Visit (INDEPENDENT_AMBULATORY_CARE_PROVIDER_SITE_OTHER): Payer: Medicare Other | Admitting: Podiatry

## 2015-03-10 ENCOUNTER — Encounter: Payer: Self-pay | Admitting: Podiatry

## 2015-03-10 VITALS — BP 156/89 | HR 76 | Resp 16

## 2015-03-10 DIAGNOSIS — M2011 Hallux valgus (acquired), right foot: Secondary | ICD-10-CM

## 2015-03-10 DIAGNOSIS — M779 Enthesopathy, unspecified: Secondary | ICD-10-CM

## 2015-03-10 MED ORDER — TRIAMCINOLONE ACETONIDE 10 MG/ML IJ SUSP
10.0000 mg | Freq: Once | INTRAMUSCULAR | Status: AC
  Administered 2015-03-10: 10 mg

## 2015-03-10 NOTE — Progress Notes (Signed)
   Subjective:    Patient ID: Carlos Choi, male    DOB: [birth year], 69 y.o.   MRN: [id number]  HPI Comments: "I've got this pain in my big toe area"  Patient presents with: Foot Pain: 1st MPJ right - aching for 1 year, worsened recently, swells and gets red occasional, notices the plantar side is real sore when walking for long periods, tried Ibuprofen-helps some.    Foot Pain      Review of Systems  All other systems reviewed and are negative.      Objective:   Physical Exam        Assessment & Plan:

## 2015-03-12 NOTE — Progress Notes (Signed)
Subjective:     Patient ID: Carlos Choi, male   DOB: December 14, 1945, 69 y.o.   MRN: [id number]  HPI patient presents stating that his right foot has been aching for around one year and it's been worse recently. It swells and gets red and states that at times it makes it hard to wear shoe gear comfortably and he has been having to change shoe gear often   Review of Systems  All other systems reviewed and are negative.      Objective:   Physical Exam  Cardiovascular: Intact distal pulses.   Musculoskeletal: Normal range of motion.  Neurological: He is alert.  Skin: Skin is warm.  Nursing note and vitals reviewed.  neurovascular status intact muscle strength was found to be adequate with range of motion moderately diminished of the subtalar midtarsal joint. Patient has redness and pain around the first metatarsal head right with discomfort when I pressed into the joint surface and also to the medial side with prominence the bone and mild deviation of the hallux against the second toe. Patient has good digital perfusion and is well oriented 3     Assessment:    patient does have structural bunion deformity with mild limitation of motion with possible inflammation of the joint surface noted.    Plan:     H&P and x-ray reviewed concerning condition. Today I did careful injection around the first MPJ 3 mg Kenalog 5 mg Xylocaine and advised on wider-type shoe gear. Reappoint to recheck

## 2015-04-07 ENCOUNTER — Ambulatory Visit (INDEPENDENT_AMBULATORY_CARE_PROVIDER_SITE_OTHER): Payer: Medicare Other

## 2015-04-07 DIAGNOSIS — J111 Influenza due to unidentified influenza virus with other respiratory manifestations: Secondary | ICD-10-CM

## 2015-04-07 DIAGNOSIS — Z23 Encounter for immunization: Secondary | ICD-10-CM | POA: Diagnosis not present

## 2015-06-05 DIAGNOSIS — Z23 Encounter for immunization: Secondary | ICD-10-CM | POA: Diagnosis not present

## 2015-07-06 DIAGNOSIS — N529 Male erectile dysfunction, unspecified: Secondary | ICD-10-CM | POA: Diagnosis not present

## 2015-07-06 DIAGNOSIS — E785 Hyperlipidemia, unspecified: Secondary | ICD-10-CM | POA: Diagnosis not present

## 2015-07-06 DIAGNOSIS — I10 Essential (primary) hypertension: Secondary | ICD-10-CM | POA: Diagnosis not present

## 2015-07-06 DIAGNOSIS — Z125 Encounter for screening for malignant neoplasm of prostate: Secondary | ICD-10-CM | POA: Diagnosis not present

## 2015-07-06 DIAGNOSIS — Z0000 Encounter for general adult medical examination without abnormal findings: Secondary | ICD-10-CM | POA: Diagnosis not present

## 2015-07-06 DIAGNOSIS — N433 Hydrocele, unspecified: Secondary | ICD-10-CM | POA: Diagnosis not present

## 2015-07-06 DIAGNOSIS — Z1211 Encounter for screening for malignant neoplasm of colon: Secondary | ICD-10-CM | POA: Diagnosis not present

## 2015-07-17 DIAGNOSIS — Z1211 Encounter for screening for malignant neoplasm of colon: Secondary | ICD-10-CM | POA: Diagnosis not present

## 2015-07-27 ENCOUNTER — Ambulatory Visit (INDEPENDENT_AMBULATORY_CARE_PROVIDER_SITE_OTHER): Payer: Medicare Other

## 2015-07-27 ENCOUNTER — Ambulatory Visit (INDEPENDENT_AMBULATORY_CARE_PROVIDER_SITE_OTHER): Payer: Medicare Other | Admitting: Family Medicine

## 2015-07-27 VITALS — BP 130/80 | HR 80 | Temp 98.0°F | Resp 17 | Ht 76.0 in | Wt 207.4 lb

## 2015-07-27 DIAGNOSIS — R109 Unspecified abdominal pain: Secondary | ICD-10-CM

## 2015-07-27 LAB — POCT URINALYSIS DIP (MANUAL ENTRY)
Bilirubin, UA: NEGATIVE
Glucose, UA: NEGATIVE
Ketones, POC UA: NEGATIVE
Leukocytes, UA: NEGATIVE
Nitrite, UA: NEGATIVE
Spec Grav, UA: >=1.03
Urobilinogen, UA: 0.2
pH, UA: 5.5

## 2015-07-27 LAB — POC MICROSCOPIC URINALYSIS (UMFC)

## 2015-07-27 MED ORDER — METRONIDAZOLE 250 MG PO TABS: 250.0000 mg | ORAL_TABLET | Freq: Two times a day (BID) | ORAL | Status: DC

## 2015-07-27 MED ORDER — CYCLOBENZAPRINE HCL 5 MG PO TABS: 5.0000 mg | ORAL_TABLET | Freq: Every day | ORAL | Status: DC

## 2015-07-27 NOTE — Patient Instructions (Addendum)
Because you received an x-ray today, you will receive an invoice from [facility]. Please contact [facility] at [phone] with questions or concerns regarding your invoice. Our billing staff will not be able to assist you with those questions.   there is some increased stool in the bowel but I believe that most of this is muscular. I'm going to give you a antibiotic because of the chronic problem with the bowels but I'm also giving you a muscle relaxer to take at night.

## 2015-07-27 NOTE — Progress Notes (Addendum)
By signing my name below, I, Rawaa Al Rifaie, attest that this documentation has been prepared under the direction and in the presence of Robyn Haber, [place], Medical Scribe. 07/27/2015.  2:13 PM.     Patient ID: Carlos Choi MRN: [id number], DOB: 1946-05-16, 70 y.o. Date of Encounter: 07/27/2015  Primary Physician: Robyn Haber, MD  Chief Complaint:  Chief Complaint  Patient presents with   Other    right side pain x couples of weeks    HPI:  Carlos Choi is a 70 y.o. male who presents to [facility] complaining of intermittent intense stabbing right flank area pain, that does present occasionally on the left side, onset 2 months ago.  Pt reports that the pain is mostly in one spot in the right flank area. He notes that the area is sensitive to the touch at time, and is exacerbated with movement. Pt indicates that the pain is triggered by the passing over pumps on the road while in a car. He took Ibuprofen for the symptoms with no relief. Pt reports having a hx of back pain, however his current symptoms are different.   Past Medical History  Diagnosis Date   Hemorrhoids    Rectal bleeding     due to hemorrhoid    Hypertension    Hypothyroid      Home Meds: Prior to Admission medications   Medication Sig Start Date End Date Taking? Authorizing Provider  ALPRAZolam Duanne Moron) 0.25 MG tablet Take 0.25 mg by mouth at bedtime as needed for anxiety.   Yes Historical Provider, MD  aspirin 81 MG tablet Take 81 mg by mouth every other day.     Yes Historical Provider, MD  Lactobacillus Rhamnosus, GG, (CULTURELLE PO) Take 1 tablet by mouth daily.   Yes Historical Provider, MD  levothyroxine (SYNTHROID, LEVOTHROID) 100 MCG tablet Take 1 tablet (100 mcg total) by mouth daily. 10/06/14  Yes Robyn Haber, MD  loratadine (CLARITIN) 10 MG tablet Take 10 mg by mouth daily.   Yes Historical Provider, MD  Multiple Vitamins-Minerals (CENTRUM  SILVER PO) Take 1 tablet by mouth daily.    Yes Historical Provider, MD  Omega-3 Fatty Acids (FISH OIL) 1000 MG CAPS Take 3 capsules by mouth daily.    Yes Historical Provider, MD  hyoscyamine (LEVSIN/SL) 0.125 MG SL tablet Place 1 tablet (0.125 mg total) under the tongue daily after breakfast. Patient not taking: Reported on 07/27/2015 01/23/15   Robyn Haber, MD    Allergies: No Known Allergies  Social History   Social History   Marital Status: Married    Spouse Name: N/A   Number of Children: N/A   Years of Education: N/A   Occupational History   Not on file.   Social History Main Topics   Smoking status: Former Smoker    Quit date: 06/13/1971   Smokeless tobacco: Never Used   Alcohol Use: Yes     Comment: 2 beers Monthly.   Drug Use: No   Sexual Activity: Not on file   Other Topics Concern   Not on file   Social History Narrative     Review of Systems: Constitutional: negative for chills, fever, night sweats, weight changes, or fatigue  HEENT: negative for vision changes, hearing loss, congestion, rhinorrhea, ST, epistaxis, or sinus pressure Cardiovascular: negative for chest pain or palpitations Respiratory: negative for hemoptysis, wheezing, shortness of breath, or cough Abdominal: negative for abdominal pain, nausea, vomiting, diarrhea, or constipation  Dermatological: negative for rash Neurologic: negative for headache, dizziness, or syncope GU: Positive for flank pain.  Msk: Positive for myalgia. All other systems reviewed and are otherwise negative with the exception to those above and in the HPI.  Physical Exam: Blood pressure 130/80, pulse 80, temperature 98 F (36.7 C), temperature source Oral, resp. rate 17, height 6\' 4"  (1.93 m), weight 207 lb 6.4 oz (94.076 kg), SpO2 96 %., Body mass index is 25.26 kg/(m^2). General: Well developed, well nourished, in no acute distress. Head: Normocephalic, atraumatic, eyes without discharge, sclera  non-icteric, nares are without discharge. Bilateral auditory canals clear, TM's are without perforation, pearly grey and translucent with reflective cone of light bilaterally. Oral cavity moist, posterior pharynx without exudate, erythema, peritonsillar abscess, or post nasal drip.  Neck: Supple. No thyromegaly. Full ROM. No lymphadenopathy. Lungs: Clear bilaterally to auscultation without wheezes, rales, or rhonchi. Breathing is unlabored. Heart: RRR with S1 S2. No murmurs, rubs, or gallops appreciated. Abdomen: Soft, non-tender, non-distended with normoactive bowel sounds. No hepatomegaly. No rebound/guarding. No obvious abdominal masses.  Msk:  Strength and tone normal for age. Extremities/Skin: Warm and dry. No clubbing or cyanosis. No edema. No rashes or suspicious lesions.   Neuro: Alert and oriented X 3. Moves all extremities spontaneously. Gait is normal. CNII-XII grossly in tact. Psych:  Responds to questions appropriately with a normal affect.   X-ray read by Dr. Robyn Haber: Significant stool burden. Labs: Results for orders placed or performed in visit on 07/27/15  POCT urinalysis dipstick  Result Value Ref Range   Color, UA yellow yellow   Clarity, UA clear clear   Glucose, UA negative negative   Bilirubin, UA negative negative   Ketones, POC UA negative negative   Spec Grav, UA >=1.030    Blood, UA trace-intact (A) negative   pH, UA 5.5    Protein Ur, POC trace (A) negative   Urobilinogen, UA 0.2    Nitrite, UA Negative Negative   Leukocytes, UA Negative Negative  POCT Microscopic Urinalysis ([facility])  Result Value Ref Range   WBC,UR,HPF,POC None None WBC/hpf   RBC,UR,HPF,POC None None RBC/hpf   Bacteria Few (A) None, Too numerous to count   Mucus Present (A) Absent   Epithelial Cells, UR Per Microscopy Few (A) None, Too numerous to count cells/hpf     ASSESSMENT AND PLAN:  70 y.o. year old male with intermittent right flank pains    This chart was scribed in  my presence and reviewed by me personally.    ICD-9-CM ICD-10-CM   1. Right flank pain 789.09 R10.9 POCT urinalysis dipstick     POCT Microscopic Urinalysis ([facility])     DG Abd 1 View     metroNIDAZOLE (FLAGYL) 250 MG tablet     cyclobenzaprine (FLEXERIL) 5 MG tablet      Signed, Robyn Haber, MD 07/27/2015 2:57 PM

## 2015-08-01 ENCOUNTER — Encounter (HOSPITAL_COMMUNITY): Payer: Self-pay

## 2015-08-01 ENCOUNTER — Ambulatory Visit (INDEPENDENT_AMBULATORY_CARE_PROVIDER_SITE_OTHER): Payer: Medicare Other | Admitting: Family Medicine

## 2015-08-01 ENCOUNTER — Ambulatory Visit (HOSPITAL_COMMUNITY)
Admission: RE | Admit: 2015-08-01 | Discharge: 2015-08-01 | Disposition: A | Discharge status: Home / Self Care | Payer: Medicare Other | Source: Ambulatory Visit | Attending: Family Medicine | Admitting: Family Medicine

## 2015-08-01 VITALS — BP 150/87 | HR 88 | Temp 98.2°F | Resp 16 | Wt 209.0 lb

## 2015-08-01 DIAGNOSIS — M549 Dorsalgia, unspecified: Secondary | ICD-10-CM | POA: Insufficient documentation

## 2015-08-01 DIAGNOSIS — R109 Unspecified abdominal pain: Secondary | ICD-10-CM | POA: Insufficient documentation

## 2015-08-01 DIAGNOSIS — K5790 Diverticulosis of intestine, part unspecified, without perforation or abscess without bleeding: Secondary | ICD-10-CM | POA: Diagnosis not present

## 2015-08-01 DIAGNOSIS — K5730 Diverticulosis of large intestine without perforation or abscess without bleeding: Secondary | ICD-10-CM | POA: Diagnosis not present

## 2015-08-01 LAB — POC MICROSCOPIC URINALYSIS (UMFC): Mucus: ABSENT

## 2015-08-01 LAB — POCT URINALYSIS DIP (MANUAL ENTRY)
Bilirubin, UA: NEGATIVE
Glucose, UA: NEGATIVE
Leukocytes, UA: NEGATIVE
Nitrite, UA: NEGATIVE
Protein Ur, POC: NEGATIVE
Spec Grav, UA: 1.02
Urobilinogen, UA: 0.2
pH, UA: 5.5

## 2015-08-01 LAB — CREATININE, SERUM
Creatinine, Ser: 1.03 mg/dL (ref 0.61–1.24)
GFR calc Af Amer: >60 mL/min (ref >60)
GFR calc non Af Amer: >60 mL/min (ref >60)

## 2015-08-01 MED ORDER — IOHEXOL 300 MG/ML  SOLN
100.0000 mL | Freq: Once | INTRAMUSCULAR | Status: AC | PRN
  Administered 2015-08-01: 100 mL via INTRAVENOUS

## 2015-08-01 NOTE — Progress Notes (Addendum)
By signing my name below, I, [name], attest that this documentation has been prepared under the direction and in the presence of Robyn Haber, MD. Electronically Signed: [name], [place]. 08/01/2015 , 2:37 PM .  Patient was seen in room 11 .   Patient ID: Carlos Choi MRN: [id number], DOB: Oct 09, 1945, 70 y.o. Date of Encounter: 08/01/2015  Primary Physician: Robyn Haber, MD  Chief Complaint:  Chief Complaint  Patient presents with  . Follow-up    back pain, still painful    HPI:  Carlos Choi is a 70 y.o. male who presents to [facility] complaining of intermittent right flank pain. He states that he's taken some laxatives but pain worsened yesterday. The pain is just below his right posterior ribs. Yesterday, his pain radiated towards the left posterior ribs. He denies dysuria, or nausea.   He had back problems in the past, towards his neck and lower lumbar.   Past Medical History  Diagnosis Date  . Hemorrhoids   . Rectal bleeding     due to hemorrhoid   . Hypertension   . Hypothyroid      Home Meds: Prior to Admission medications   Medication Sig Start Date End Date Taking? Authorizing Provider  ALPRAZolam Duanne Moron) 0.25 MG tablet Take 0.25 mg by mouth at bedtime as needed for anxiety.    Historical Provider, MD  aspirin 81 MG tablet Take 81 mg by mouth every other day.      Historical Provider, MD  cyclobenzaprine (FLEXERIL) 5 MG tablet Take 1 tablet (5 mg total) by mouth at bedtime. 07/27/15   Robyn Haber, MD  hyoscyamine (LEVSIN/SL) 0.125 MG SL tablet Place 1 tablet (0.125 mg total) under the tongue daily after breakfast. Patient not taking: Reported on 07/27/2015 01/23/15   Robyn Haber, MD  Lactobacillus Rhamnosus, GG, (CULTURELLE PO) Take 1 tablet by mouth daily.    Historical Provider, MD  levothyroxine (SYNTHROID, LEVOTHROID) 100 MCG tablet Take 1 tablet (100 mcg total) by mouth daily. 10/06/14   Robyn Haber, MD    loratadine (CLARITIN) 10 MG tablet Take 10 mg by mouth daily.    Historical Provider, MD  metroNIDAZOLE (FLAGYL) 250 MG tablet Take 1 tablet (250 mg total) by mouth 2 (two) times daily. 07/27/15   Robyn Haber, MD  Multiple Vitamins-Minerals (CENTRUM SILVER PO) Take 1 tablet by mouth daily.     Historical Provider, MD  Omega-3 Fatty Acids (FISH OIL) 1000 MG CAPS Take 3 capsules by mouth daily.     Historical Provider, MD    Allergies: No Known Allergies  Social History   Social History  . Marital Status: Married    Spouse Name: N/A  . Number of Children: N/A  . Years of Education: N/A   Occupational History  . Not on file.   Social History Main Topics  . Smoking status: Former Smoker    Quit date: 06/13/1971  . Smokeless tobacco: Never Used  . Alcohol Use: Yes     Comment: 2 beers Monthly.  . Drug Use: No  . Sexual Activity: Not on file   Other Topics Concern  . Not on file   Social History Narrative     Review of Systems: Constitutional: negative for fever, chills, night sweats, weight changes, or fatigue  HEENT: negative for vision changes, hearing loss, congestion, rhinorrhea, ST, epistaxis, or sinus pressure Cardiovascular: negative for chest pain or palpitations Respiratory: negative for hemoptysis, wheezing, shortness of breath, or cough Abdominal: negative  for nausea, vomiting, diarrhea, or constipation; positive for flank pain (right) Dermatological: negative for rash Neurologic: negative for headache, dizziness, or syncope All other systems reviewed and are otherwise negative with the exception to those above and in the HPI.  Physical Exam: Blood pressure 150/87, pulse 88, temperature 98.2 F (36.8 C), resp. rate 16, weight 209 lb (94.802 kg), SpO2 98 %., Body mass index is 25.45 kg/(m^2). General: Well developed, well nourished, in no acute distress. Head: Normocephalic, atraumatic, eyes without discharge, sclera non-icteric, nares are without discharge.  Bilateral auditory canals clear, TM's are without perforation, pearly grey and translucent with reflective cone of light bilaterally. Oral cavity moist, posterior pharynx without exudate, erythema, peritonsillar abscess, or post nasal drip.  Neck: Supple. No thyromegaly. Full ROM. No lymphadenopathy. Lungs: Clear bilaterally to auscultation without wheezes, rales, or rhonchi. Breathing is unlabored. Heart: RRR with S1 S2. No murmurs, rubs, or gallops appreciated. Abdomen: Soft, non-distended with normoactive bowel sounds. No hepatomegaly. No obvious abdominal masses. Tender below right posterior ribs  Msk:  Strength and tone normal for age. Extremities/Skin: Warm and dry. No clubbing or cyanosis. No edema. No rashes or suspicious lesions. Neuro: Alert and oriented X 3. Moves all extremities spontaneously. Gait is normal. CNII-XII grossly in tact. Psych:  Responds to questions appropriately with a normal affect.   Labs: Results for orders placed or performed in visit on 08/01/15  POCT urinalysis dipstick  Result Value Ref Range   Color, UA yellow yellow   Clarity, UA clear clear   Glucose, UA negative negative   Bilirubin, UA negative negative   Ketones, POC UA trace (5) (A) negative   Spec Grav, UA 1.020    Blood, UA trace-intact (A) negative   pH, UA 5.5    Protein Ur, POC negative negative   Urobilinogen, UA 0.2    Nitrite, UA Negative Negative   Leukocytes, UA Negative Negative  POCT Microscopic Urinalysis ([facility])  Result Value Ref Range   WBC,UR,HPF,POC Few (A) None WBC/hpf   RBC,UR,HPF,POC None None RBC/hpf   Bacteria None None, Too numerous to count   Mucus Absent Absent   Epithelial Cells, UR Per Microscopy None None, Too numerous to count cells/hpf     ASSESSMENT AND PLAN:  70 y.o. year old male with  This chart was scribed in my presence and reviewed by me personally.    ICD-9-CM ICD-10-CM   1. Right flank pain 789.09 R10.9 POCT urinalysis dipstick     POCT  Microscopic Urinalysis ([facility])     CT Angio Abd/Pel w/ and/or w/o     Signed, Robyn Haber, MD 08/01/2015 2:37 PM

## 2015-08-01 NOTE — Patient Instructions (Signed)
YOU ARE TO GO OVER TO [place] NOW FOR YOUR CT SCAN.  GO TO THE [address] OFF OF [address] [place] [address].  GO TO THE ADMITTING DESK AND THEY WILL GET YOU WHERE YOU NEED TO GO.

## 2015-08-02 ENCOUNTER — Other Ambulatory Visit: Payer: Self-pay | Admitting: Family Medicine

## 2015-08-02 DIAGNOSIS — R109 Unspecified abdominal pain: Secondary | ICD-10-CM

## 2015-08-02 MED ORDER — METRONIDAZOLE 250 MG PO TABS: 250.0000 mg | ORAL_TABLET | Freq: Two times a day (BID) | ORAL | Status: DC

## 2015-08-18 DIAGNOSIS — R972 Elevated prostate specific antigen [PSA]: Secondary | ICD-10-CM | POA: Diagnosis not present

## 2015-08-25 DIAGNOSIS — Z0000 Encounter for general adult medical examination without abnormal findings: Secondary | ICD-10-CM | POA: Diagnosis not present

## 2015-08-25 DIAGNOSIS — R3912 Poor urinary stream: Secondary | ICD-10-CM | POA: Diagnosis not present

## 2015-08-25 DIAGNOSIS — N138 Other obstructive and reflux uropathy: Secondary | ICD-10-CM | POA: Diagnosis not present

## 2015-08-25 DIAGNOSIS — N401 Enlarged prostate with lower urinary tract symptoms: Secondary | ICD-10-CM | POA: Diagnosis not present

## 2015-09-13 ENCOUNTER — Telehealth: Payer: BC Managed Care – PPO

## 2015-09-13 NOTE — Pre-Procedure Instructions (Signed)
Patient unable to or refusing to go over medication list and does not have Dr. Tomma Lightning numbers for doctors from [facility].  Offered to reschedule interview with his daughter and he declined.  Angry and abruptly ended the phone call when offered to have my supervisor call him back.

## 2015-09-19 ENCOUNTER — Telehealth: Payer: BC Managed Care – PPO

## 2015-09-19 NOTE — Pre-Procedure Instructions (Signed)
Attempted scheduled preop phone interview but patient stated he is going to call Dr. Graceann Congress office to reschedule procedure for another time. I informed him that we will complete interview once new procedure date completed;he verbalized understanding.

## 2015-09-21 ENCOUNTER — Ambulatory Visit: Admission: RE | Admit: 2015-09-21 | Payer: Medicare Other | Source: Ambulatory Visit | Admitting: Gastroenterology

## 2015-09-21 ENCOUNTER — Encounter: Admission: RE | Payer: Self-pay | Source: Ambulatory Visit

## 2015-09-21 SURGERY — DONT USE, USE 1094-COLONOSCOPY, DIAGNOSTIC (SCREENING)
Anesthesia: Monitor Anesthesia Care | Site: Abdomen

## 2015-09-21 NOTE — Progress Notes (Signed)
History and physical reviewed.  Patient re-examined, H and P unchanged.  Levell July, MD

## 2015-10-31 DIAGNOSIS — K1329 Other disturbances of oral epithelium, including tongue: Secondary | ICD-10-CM | POA: Diagnosis not present

## 2015-11-02 ENCOUNTER — Encounter: Payer: Self-pay | Admitting: Family Medicine

## 2015-11-02 ENCOUNTER — Telehealth: Payer: Self-pay

## 2015-11-02 ENCOUNTER — Ambulatory Visit (INDEPENDENT_AMBULATORY_CARE_PROVIDER_SITE_OTHER): Payer: Medicare Other | Admitting: Family Medicine

## 2015-11-02 VITALS — BP 128/80 | HR 81 | Temp 97.7°F | Resp 16 | Ht 75.0 in | Wt 201.0 lb

## 2015-11-02 DIAGNOSIS — Z0000 Encounter for general adult medical examination without abnormal findings: Secondary | ICD-10-CM

## 2015-11-02 DIAGNOSIS — K589 Irritable bowel syndrome without diarrhea: Secondary | ICD-10-CM

## 2015-11-02 DIAGNOSIS — E038 Other specified hypothyroidism: Secondary | ICD-10-CM | POA: Diagnosis not present

## 2015-11-02 LAB — COMPLETE METABOLIC PANEL WITH GFR
ALT: 11 U/L (ref 9–46)
AST: 18 U/L (ref 10–35)
Albumin: 4.7 g/dL (ref 3.6–5.1)
Alkaline Phosphatase: 79 U/L (ref 40–115)
BUN: 19 mg/dL (ref 7–25)
CO2: 23 mmol/L (ref 20–31)
Calcium: 9.7 mg/dL (ref 8.6–10.3)
Chloride: 103 mmol/L (ref 98–110)
Creat: 1.06 mg/dL (ref 0.70–1.25)
GFR, Est African American: 83 mL/min (ref >=60)
GFR, Est Non African American: 72 mL/min (ref >=60)
Glucose, Bld: 77 mg/dL (ref 65–99)
Potassium: 4.5 mmol/L (ref 3.5–5.3)
Sodium: 139 mmol/L (ref 135–146)
Total Bilirubin: 1 mg/dL (ref 0.2–1.2)
Total Protein: 7.8 g/dL (ref 6.1–8.1)

## 2015-11-02 LAB — POCT CBC
Granulocyte percent: 67.1 %G (ref 37–80)
HCT, POC: 49.5 % (ref 43.5–53.7)
Hemoglobin: 17.7 g/dL (ref 14.1–18.1)
Lymph, poc: 1.9 (ref 0.6–3.4)
MCH, POC: 32.2 pg — AB (ref 27–31.2)
MCHC: 35.8 g/dL — AB (ref 31.8–35.4)
MCV: 98.8 fL — AB (ref 80–97)
MID (cbc): 0.7 (ref 0–0.9)
MPV: 8.5 fL (ref 0–99.8)
POC Granulocyte: 5.2 (ref 2–6.9)
POC LYMPH PERCENT: 24.2 %L (ref 10–50)
POC MID %: 8.7 %M (ref 0–12)
Platelet Count, POC: 172 10*3/uL (ref 142–424)
RBC: 5.51 M/uL (ref 4.69–6.13)
RDW, POC: 13.3 %
WBC: 7.7 10*3/uL (ref 4.6–10.2)

## 2015-11-02 LAB — LIPID PANEL
Cholesterol: 225 mg/dL — ABNORMAL HIGH (ref 125–200)
HDL: 43 mg/dL (ref >=40)
LDL Cholesterol: 150 mg/dL — ABNORMAL HIGH (ref <130)
Total CHOL/HDL Ratio: 5.2 Ratio — ABNORMAL HIGH (ref <=5.0)
Triglycerides: 159 mg/dL — ABNORMAL HIGH (ref <150)
VLDL: 32 mg/dL — ABNORMAL HIGH (ref <30)

## 2015-11-02 LAB — TSH: TSH: 1.83 mIU/L (ref 0.40–4.50)

## 2015-11-02 MED ORDER — ALPRAZOLAM 0.25 MG PO TABS: 0.2500 mg | ORAL_TABLET | Freq: Every day | ORAL | Status: DC | PRN

## 2015-11-02 MED ORDER — LEVOTHYROXINE SODIUM 100 MCG PO TABS: 100.0000 ug | ORAL_TABLET | Freq: Every day | ORAL | Status: DC

## 2015-11-02 NOTE — Patient Instructions (Addendum)
Health Maintenance, Male A healthy lifestyle and preventative care can promote health and wellness.  Maintain regular health, dental, and eye exams.  Eat a healthy diet. Foods like vegetables, fruits, whole grains, low-fat dairy products, and lean protein foods contain the nutrients you need and are low in calories. Decrease your intake of foods high in solid fats, added sugars, and salt. Get information about a proper diet from your health care provider, if necessary.  Regular physical exercise is one of the most important things you can do for your health. Most adults should get at least 150 minutes of moderate-intensity exercise (any activity that increases your heart rate and causes you to sweat) each week. In addition, most adults need muscle-strengthening exercises on 2 or more days a week.   Maintain a healthy weight. The body mass index (BMI) is a screening tool to identify possible weight problems. It provides an estimate of body fat based on height and weight. Your health care provider can find your BMI and can help you achieve or maintain a healthy weight. For males 20 years and older:  A BMI below 18.5 is considered underweight.  A BMI of 18.5 to 24.9 is normal.  A BMI of 25 to 29.9 is considered overweight.  A BMI of 30 and above is considered obese.  Maintain normal blood lipids and cholesterol by exercising and minimizing your intake of saturated fat. Eat a balanced diet with plenty of fruits and vegetables. Blood tests for lipids and cholesterol should begin at age 62 and be repeated every 5 years. If your lipid or cholesterol levels are high, you are over age 93, or you are at high risk for heart disease, you may need your cholesterol levels checked more frequently.Ongoing high lipid and cholesterol levels should be treated with medicines if diet and exercise are not working.  If you smoke, find out from your health care provider how to quit. If you do not use tobacco, do  not start.  Lung cancer screening is recommended for adults aged 36-80 years who are at high risk for developing lung cancer because of a history of smoking. A yearly low-dose CT scan of the lungs is recommended for people who have at least a 30-pack-year history of smoking and are current smokers or have quit within the past 15 years. A pack year of smoking is smoking an average of 1 pack of cigarettes a day for 1 year (for example, a 30-pack-year history of smoking could mean smoking 1 pack a day for 30 years or 2 packs a day for 15 years). Yearly screening should continue until the smoker has stopped smoking for at least 15 years. Yearly screening should be stopped for people who develop a health problem that would prevent them from having lung cancer treatment.  If you choose to drink alcohol, do not have more than 2 drinks per day. One drink is considered to be 12 oz (360 mL) of beer, 5 oz (150 mL) of wine, or 1.5 oz (45 mL) of liquor.  Avoid the use of street drugs. Do not share needles with anyone. Ask for help if you need support or instructions about stopping the use of drugs.  High blood pressure causes heart disease and increases the risk of stroke. High blood pressure is more likely to develop in:  People who have blood pressure in the end of the normal range (100-139/85-89 mm Hg).  People who are overweight or obese.  People who are African American.  If you are 74-6 years of age, have your blood pressure checked every 3-5 years. If you are 30 years of age or older, have your blood pressure checked every year. You should have your blood pressure measured twice--once when you are at a hospital or clinic, and once when you are not at a hospital or clinic. Record the average of the two measurements. To check your blood pressure when you are not at a hospital or clinic, you can use:  An automated blood pressure machine at a pharmacy.  A home blood pressure monitor.  If you are 98-66  years old, ask your health care provider if you should take aspirin to prevent heart disease.  Diabetes screening involves taking a blood sample to check your fasting blood sugar level. This should be done once every 3 years after age 20 if you are at a normal weight and without risk factors for diabetes. Testing should be considered at a younger age or be carried out more frequently if you are overweight and have at least 1 risk factor for diabetes.  Colorectal cancer can be detected and often prevented. Most routine colorectal cancer screening begins at the age of 53 and continues through age 10. However, your health care provider may recommend screening at an earlier age if you have risk factors for colon cancer. On a yearly basis, your health care provider may provide home test kits to check for hidden blood in the stool. A small camera at the end of a tube may be used to directly examine the colon (sigmoidoscopy or colonoscopy) to detect the earliest forms of colorectal cancer. Talk to your health care provider about this at age 36 when routine screening begins. A direct exam of the colon should be repeated every 5-10 years through age 22, unless early forms of precancerous polyps or small growths are found.  People who are at an increased risk for hepatitis B should be screened for this virus. You are considered at high risk for hepatitis B if:  You were born in a country where hepatitis B occurs often. Talk with your health care provider about which countries are considered high risk.  Your parents were born in a high-risk country and you have not received a shot to protect against hepatitis B (hepatitis B vaccine).  You have HIV or AIDS.  You use needles to inject street drugs.  You live with, or have sex with, someone who has hepatitis B.  You are a man who has sex with other men (MSM).  You get hemodialysis treatment.  You take certain medicines for conditions like cancer, organ  transplantation, and autoimmune conditions.  Hepatitis C blood testing is recommended for all people born from [birth year] through 1963 and any individual with known risk factors for hepatitis C.  Healthy men should no longer receive prostate-specific antigen (PSA) blood tests as part of routine cancer screening. Talk to your health care provider about prostate cancer screening.  Testicular cancer screening is not recommended for adolescents or adult males who have no symptoms. Screening includes self-exam, a health care provider exam, and other screening tests. Consult with your health care provider about any symptoms you have or any concerns you have about testicular cancer.  Practice safe sex. Use condoms and avoid high-risk sexual practices to reduce the spread of sexually transmitted infections (STIs).  You should be screened for STIs, including gonorrhea and chlamydia if:  You are sexually active and are younger than 24 years.  You  are older than 24 years, and your health care provider tells you that you are at risk for this type of infection.  Your sexual activity has changed since you were last screened, and you are at an increased risk for chlamydia or gonorrhea. Ask your health care provider if you are at risk.  If you are at risk of being infected with HIV, it is recommended that you take a prescription medicine daily to prevent HIV infection. This is called pre-exposure prophylaxis (PrEP). You are considered at risk if:  You are a man who has sex with other men (MSM).  You are a heterosexual man who is sexually active with multiple partners.  You take drugs by injection.  You are sexually active with a partner who has HIV.  Talk with your health care provider about whether you are at high risk of being infected with HIV. If you choose to begin PrEP, you should first be tested for HIV. You should then be tested every 3 months for as long as you are taking PrEP.  Use sunscreen. Apply  sunscreen liberally and repeatedly throughout the day. You should seek shade when your shadow is shorter than you. Protect yourself by wearing long sleeves, pants, a wide-brimmed hat, and sunglasses year round whenever you are outdoors.  Tell your health care provider of new moles or changes in moles, especially if there is a change in shape or color. Also, tell your health care provider if a mole is larger than the size of a pencil eraser.  A one-time screening for abdominal aortic aneurysm (AAA) and surgical repair of large AAAs by ultrasound is recommended for men aged 31-75 years who are current or former smokers.  Stay current with your vaccines (immunizations).   This information is not intended to replace advice given to you by your health care provider. Make sure you discuss any questions you have with your health care provider.   Document Released: 11/30/2007 Document Revised: 06/24/2014 Document Reviewed: 10/29/2010 Elsevier Interactive Patient Education [employer].    IF you received an x-ray today, you will receive an invoice from [facility]. Please contact [facility] at [phone] with questions or concerns regarding your invoice.   IF you received labwork today, you will receive an invoice from [employer]. Please contact [facility] at [phone] with questions or concerns regarding your invoice.   Our billing staff will not be able to assist you with questions regarding bills from these companies.  You will be contacted with the lab results as soon as they are available. The fastest way to get your results is to activate your My Chart account. Instructions are located on the last page of this paperwork. If you have not heard from [place] regarding the results in 2 weeks, please contact this office.

## 2015-11-02 NOTE — Telephone Encounter (Signed)
Paper wprk for his [facility] refill order form was faxed to [facility] as well as synthroid and xanax

## 2015-11-02 NOTE — Progress Notes (Signed)
Patient ID: Carlos Choi MRN: [id number], DOB: 23-Sep-1945 70 y.o. Date of Encounter: 11/02/2015, 1:27 PM  Primary Physician: Robyn Haber, MD  Chief Complaint: Physical (CPE)  HPI: 70 y.o. y/o male with history noted below here for CPE.  Doing well.  Walking 2 miles daily Patient lives with his wife in [place]. He has 2 children and 3 grandchildren. He's been enjoying going on cruises with his family and has a trip planned for [place]  He continues to have intermittent problems with the irritable bowel syndrome which is remedied by his intermittent use of alprazolam which works within a half an hour.  He recently had a biopsy of a lesion on his right undersurface of the tongue and the biopsy results should be back next Tuesday.  He recently had Dr. Risa Grill do his urological exam and was told that he did not need to return.  Patient continues to have glaucoma and it is getting worse and he may need to have a procedure done to lower the pressure.  The pain he had in his flank has resolved since we had to investigate with a CT scan 2 months ago.  Review of Systems: Consitutional: No fever, chills, fatigue, night sweats, lymphadenopathy, or weight changes. Eyes: No visual changes, eye redness, or discharge. ENT/Mouth: Ears: No otalgia, tinnitus, hearing loss, discharge. Nose: No congestion, rhinorrhea, sinus pain, or epistaxis. Throat: No sore throat, post nasal drip, or teeth pain. Cardiovascular: No CP, palpitations, diaphoresis, DOE, edema, orthopnea, PND. Respiratory: No cough, hemoptysis, SOB, or wheezing. Gastrointestinal: No anorexia, dysphagia, reflux, pain, nausea, vomiting, hematemesis, diarrhea, constipation, BRBPR, or melena. Genitourinary: No dysuria, frequency, urgency, hematuria, incontinence, nocturia, decreased urinary stream, discharge, impotence, or testicular pain/masses. Musculoskeletal: No decreased ROM, myalgias, stiffness, joint swelling, or  weakness. Skin: No rash, erythema, lesion changes, pain, warmth, jaundice, or pruritis. Neurological: No headache, dizziness, syncope, seizures, tremors, memory loss, coordination problems, or paresthesias. Psychological: No anxiety, depression, hallucinations, SI/HI. Endocrine: No fatigue, polydipsia, polyphagia, polyuria, or known diabetes. All other systems were reviewed and are otherwise negative.  Past Medical History  Diagnosis Date  . Hemorrhoids   . Rectal bleeding     due to hemorrhoid   . Hypothyroid   . Allergy      Past Surgical History  Procedure Laterality Date  . Hernia repair      Over ten years ago per patient. He was not sure of the date.  . Rotator cuff repair      both shoulders - patient does not remember the exact date    Home Meds:  Prior to Admission medications   Medication Sig Start Date End Date Taking? Authorizing Provider  aspirin 81 MG tablet Take 81 mg by mouth every other day.      Historical Provider, MD  cyclobenzaprine (FLEXERIL) 5 MG tablet Take 1 tablet (5 mg total) by mouth at bedtime. 07/27/15   Robyn Haber, MD  hyoscyamine (LEVSIN/SL) 0.125 MG SL tablet Place 1 tablet (0.125 mg total) under the tongue daily after breakfast. Patient not taking: Reported on 07/27/2015 01/23/15   Robyn Haber, MD  Lactobacillus Rhamnosus, GG, (CULTURELLE PO) Take 1 tablet by mouth daily.    Historical Provider, MD  levothyroxine (SYNTHROID, LEVOTHROID) 100 MCG tablet Take 1 tablet (100 mcg total) by mouth daily. 10/06/14   Robyn Haber, MD  loratadine (CLARITIN) 10 MG tablet Take 10 mg by mouth daily.    Historical Provider, MD  metroNIDAZOLE (FLAGYL) 250 MG tablet Take 1 tablet (  250 mg total) by mouth 2 (two) times daily. 08/02/15   Robyn Haber, MD  Multiple Vitamins-Minerals (CENTRUM SILVER PO) Take 1 tablet by mouth daily.     Historical Provider, MD  Omega-3 Fatty Acids (FISH OIL) 1000 MG CAPS Take 3 capsules by mouth daily.     Historical Provider,  MD    Allergies: No Known Allergies  Social History   Social History  . Marital Status: Married    Spouse Name: N/A  . Number of Children: N/A  . Years of Education: N/A   Occupational History  . Not on file.   Social History Main Topics  . Smoking status: Former Smoker    Quit date: 06/13/1971  . Smokeless tobacco: Never Used  . Alcohol Use: Yes     Comment: 2 beers Monthly.  . Drug Use: No  . Sexual Activity: Not on file   Other Topics Concern  . Not on file   Social History Narrative    Family History  Problem Relation Age of Onset  . Aneurysm Mother   . Alzheimer's disease Father   . Cancer Sister     ovarian  . Stroke Brother   . Hypertension Brother   . Colon cancer Neg Hx   . Esophageal cancer Neg Hx   . Stomach cancer Neg Hx   . Rectal cancer Neg Hx     Physical Exam: Blood pressure 128/80, pulse 81, temperature 97.7 F (36.5 C), temperature source Oral, resp. rate 16, height 6\' 3"  (1.905 m), weight 201 lb (91.173 kg), SpO2 96 %.  BP Readings from Last 3 Encounters:  11/02/15 128/80  08/01/15 150/87  07/27/15 130/80   General: Well developed, well nourished, in no acute distress. HEENT: Normocephalic, atraumatic. Conjunctiva pink, sclera non-icteric. Pupils 2 mm constricting to 1 mm, round, regular, and equally reactive to light and accomodation. EOMI.  Fundi benign   Internal auditory canal clear. TMs with good cone of light and without pathology. Nasal mucosa pink. Nares are without discharge. No sinus tenderness. Oral mucosa pink. Dentition good. Pharynx without exudate.  Right undersurface of tongue with white eschar at biopsy site  Neck: Supple. Trachea midline. No thyromegaly. Full ROM. No lymphadenopathy. Lungs: Clear to auscultation bilaterally without wheezes, rales, or rhonchi. Breathing is of normal effort and unlabored. Cardiovascular: RRR with S1 S2. No murmurs, rubs, or gallops appreciated. Distal pulses 2+ symmetrically. No carotid or  abdominal bruits Abdomen: Soft, non-tender, non-distended with normoactive bowel sounds. No hepatosplenomegaly or masses. No rebound/guarding. No CVA tenderness. Without hernias.   Genitourinary:  circumcised male. No penile lesions. Testes descended bilaterally, and smooth without tenderness or masses.  Musculoskeletal: Full range of motion and 5/5 strength throughout. Without swelling, atrophy, tenderness, crepitus, or warmth. Extremities without clubbing, cyanosis, or edema. Calves supple. Skin: Warm and moist without erythema, ecchymosis, wounds, or rash. Neuro: A+Ox3. CN II-XII grossly intact. Moves all extremities spontaneously. Full sensation throughout. Normal gait. DTR 2+ throughout upper and lower extremities. Finger to nose intact. Psych:  Responds to questions appropriately with a normal affect.   Lab Results  Component Value Date   CHOL 204* 10/06/2014   CHOL 219* 10/06/2013   CHOL 211* 07/16/2012   Lab Results  Component Value Date   HDL 38* 10/06/2014   HDL 46 10/06/2013   HDL 40 07/16/2012   Lab Results  Component Value Date   LDLCALC 138* 10/06/2014   LDLCALC 149* 10/06/2013   LDLCALC 144* 07/16/2012   Lab Results  Component  Value Date   TRIG 140 10/06/2014   TRIG 120 10/06/2013   TRIG 136 07/16/2012   Lab Results  Component Value Date   CHOLHDL 5.4 10/06/2014   CHOLHDL 4.8 10/06/2013   CHOLHDL 5.3 07/16/2012   No results found for: LDLDIRECT  Assessment/Plan:  70 y.o. y/o healthy male here for CPE   ICD-9-CM ICD-10-CM   1. Other specified hypothyroidism 244.8 99991111 COMPLETE METABOLIC PANEL WITH GFR     TSH     levothyroxine (SYNTHROID, LEVOTHROID) 100 MCG tablet     DISCONTINUED: levothyroxine (SYNTHROID, LEVOTHROID) 100 MCG tablet  2. Annual physical exam V70.0 Z00.00 Lipid panel     COMPLETE METABOLIC PANEL WITH GFR  3. Irritable bowel syndrome, unspecified type 564.1 K58.9 POCT CBC     ALPRAZolam (XANAX) 0.25 MG tablet     DISCONTINUED:  ALPRAZolam (XANAX) 0.25 MG tablet    Signed, Robyn Haber, MD 11/02/2015 1:27 PM

## 2015-11-03 ENCOUNTER — Encounter: Payer: Self-pay | Admitting: Radiology

## 2015-11-08 DIAGNOSIS — K1329 Other disturbances of oral epithelium, including tongue: Secondary | ICD-10-CM | POA: Diagnosis not present

## 2015-11-09 ENCOUNTER — Telehealth: Payer: Self-pay

## 2015-11-09 NOTE — Telephone Encounter (Signed)
PA needed for alprazolam which pt takes as needed for IBS. Pt has tried docusate sodium, hyoscyamine, lactobacillus acidophilus, Culturelle, and psyllium in the past. Completed on covermymeds. Pending.

## 2015-11-10 NOTE — Telephone Encounter (Signed)
[facility] called to ask if pt has a Dx of anxiety, depression or panic disorder. I saw nothing in the chart, so called pt and he reported that he does NOT have any of these Dxs. Stated he doesn't care if ins covers Rx anyway, it is inexpensive and he doesn't use it very often. Called [employer] back and advised and faxed pharm.

## 2016-01-03 DIAGNOSIS — N529 Male erectile dysfunction, unspecified: Secondary | ICD-10-CM | POA: Diagnosis not present

## 2016-01-03 DIAGNOSIS — E7800 Pure hypercholesterolemia, unspecified: Secondary | ICD-10-CM | POA: Diagnosis not present

## 2016-01-03 DIAGNOSIS — I10 Essential (primary) hypertension: Secondary | ICD-10-CM | POA: Diagnosis not present

## 2016-01-23 ENCOUNTER — Encounter: Payer: Self-pay | Admitting: Family Medicine

## 2016-01-23 ENCOUNTER — Ambulatory Visit (INDEPENDENT_AMBULATORY_CARE_PROVIDER_SITE_OTHER): Payer: Medicare Other | Admitting: Family Medicine

## 2016-01-23 VITALS — BP 110/70 | HR 76 | Temp 98.0°F | Resp 20 | Ht 76.0 in | Wt 204.0 lb

## 2016-01-23 DIAGNOSIS — Z23 Encounter for immunization: Secondary | ICD-10-CM | POA: Diagnosis not present

## 2016-01-23 DIAGNOSIS — K589 Irritable bowel syndrome without diarrhea: Secondary | ICD-10-CM | POA: Diagnosis not present

## 2016-01-23 DIAGNOSIS — Z7189 Other specified counseling: Secondary | ICD-10-CM

## 2016-01-23 DIAGNOSIS — Z7689 Persons encountering health services in other specified circumstances: Secondary | ICD-10-CM

## 2016-01-23 NOTE — Progress Notes (Signed)
Subjective:    Patient ID: Carlos Choi, male    DOB: Dec 31, 1945, 70 y.o.   MRN: [id number]  HPI  Patient is here today to establish care. Last colonoscopy was in 2015 and was normal. He sees a urologist who performs a prostate exam and checks of PSA every year. He is due for Pneumovax 23, Prevnar 13, the shingles vaccine. He is also due for hepatitis C screening. We discussed this at length today and he has no risk factors and therefore he declines this. His lab work was checked in May of this year by his previous primary care provider. His lab work was significant for an elevated LDL cholesterol. We discussed this at length today and he is against trying medication at the present time. He would like to try lifestyle changes including diet and exercise to address first. He also has irritable bowel syndrome with episodes of diarrhea that can be 4-6 times per day. His particular trigger seems to be anxiety Past Medical History:  Diagnosis Date  . Allergy   . Hemorrhoids   . Hypothyroid   . Rectal bleeding    due to hemorrhoid    Past Surgical History:  Procedure Laterality Date  . HERNIA REPAIR     Over ten years ago per patient. He was not sure of the date.  [name] ROTATOR CUFF REPAIR     both shoulders - patient does not remember the exact date   Current Outpatient Prescriptions on File Prior to Visit  Medication Sig Dispense Refill  . ALPRAZolam (XANAX) 0.25 MG tablet Take 1 tablet (0.25 mg total) by mouth daily as needed for anxiety. 90 tablet 1  . aspirin 81 MG tablet Take 81 mg by mouth every other day.      . levothyroxine (SYNTHROID, LEVOTHROID) 100 MCG tablet Take 1 tablet (100 mcg total) by mouth daily. 90 tablet 3  . loratadine (CLARITIN) 10 MG tablet Take 10 mg by mouth daily.    . Multiple Vitamins-Minerals (CENTRUM SILVER PO) Take 1 tablet by mouth daily.     . Omega-3 Fatty Acids (FISH OIL) 1000 MG CAPS Take 3 capsules by mouth daily.      No current facility-administered  medications on file prior to visit.    No Known Allergies Social History   Social History  . Marital status: Married    Spouse name: N/A  . Number of children: N/A  . Years of education: N/A   Occupational History  . Not on file.   Social History Main Topics  . Smoking status: Former Smoker    Quit date: 06/13/1971  . Smokeless tobacco: Never Used  . Alcohol use Yes     Comment: 2 beers Monthly.  . Drug use: No  . Sexual activity: Not on file   Other Topics Concern  . Not on file   Social History Narrative  . No narrative on file   Family History  Problem Relation Age of Onset  . Aneurysm Mother   . Alzheimer's disease Father   . Cancer Sister     ovarian  . Stroke Brother   . Hypertension Brother   . Colon cancer Neg Hx   . Esophageal cancer Neg Hx   . Stomach cancer Neg Hx   . Rectal cancer Neg Hx      Review of Systems  All other systems reviewed and are negative.      Objective:   Physical Exam  Constitutional: He is oriented to  person, place, and time. He appears well-developed and well-nourished. No distress.  HENT:  Head: Normocephalic and atraumatic.  Right Ear: External ear normal.  Left Ear: External ear normal.  Nose: Nose normal.  Mouth/Throat: Oropharynx is clear and moist. No oropharyngeal exudate.  Eyes: Conjunctivae and EOM are normal. Pupils are equal, round, and reactive to light. Right eye exhibits no discharge. Left eye exhibits no discharge. No scleral icterus.  Neck: Normal range of motion. Neck supple. No JVD present. No thyromegaly present.  Cardiovascular: Normal rate, regular rhythm and normal heart sounds.  Exam reveals no gallop and no friction rub.   No murmur heard. Pulmonary/Chest: Effort normal and breath sounds normal. No stridor. No respiratory distress. He has no wheezes. He has no rales. He exhibits no tenderness.  Abdominal: Soft. Bowel sounds are normal. He exhibits no distension and no mass. There is no tenderness.  There is no rebound and no guarding.  Musculoskeletal: Normal range of motion. He exhibits no edema or tenderness.  Lymphadenopathy:    He has no cervical adenopathy.  Neurological: He is alert and oriented to person, place, and time. He has normal reflexes. He displays normal reflexes. No cranial nerve deficit. He exhibits normal muscle tone. Coordination normal.  Skin: Skin is warm. No rash noted. He is not diaphoretic. No erythema. No pallor.  Psychiatric: He has a normal mood and affect. His behavior is normal. Judgment and thought content normal.  Vitals reviewed.         Assessment & Plan:  Encounter to establish care with new doctor  Encounter for immunization - Plan: Flu Vaccine QUAD 36+ mos IM  IBS (irritable bowel syndrome)  Patient received Pneumovax 23 today. I would recommend Prevnar 13 next year. We discussed the shingles vaccine. Prostate exam and colonoscopy are up-to-date. I recommended lifestyle changes, dietary changes, and recheck a fasting lipid panel in January. We will try viberzi 25 mg pobid prn IBS flares.

## 2016-01-25 DIAGNOSIS — D1801 Hemangioma of skin and subcutaneous tissue: Secondary | ICD-10-CM | POA: Diagnosis not present

## 2016-01-25 DIAGNOSIS — L821 Other seborrheic keratosis: Secondary | ICD-10-CM | POA: Diagnosis not present

## 2016-01-25 DIAGNOSIS — L570 Actinic keratosis: Secondary | ICD-10-CM | POA: Diagnosis not present

## 2016-03-12 ENCOUNTER — Other Ambulatory Visit (INDEPENDENT_AMBULATORY_CARE_PROVIDER_SITE_OTHER): Payer: Medicare Other | Admitting: Family Medicine

## 2016-03-12 DIAGNOSIS — Z23 Encounter for immunization: Secondary | ICD-10-CM

## 2016-03-29 ENCOUNTER — Ambulatory Visit (INDEPENDENT_AMBULATORY_CARE_PROVIDER_SITE_OTHER): Payer: Medicare Other

## 2016-03-29 DIAGNOSIS — Z23 Encounter for immunization: Secondary | ICD-10-CM | POA: Diagnosis not present

## 2016-04-04 DIAGNOSIS — Z23 Encounter for immunization: Secondary | ICD-10-CM | POA: Diagnosis not present

## 2016-04-17 ENCOUNTER — Emergency Department: Payer: Medicare Other

## 2016-04-17 ENCOUNTER — Inpatient Hospital Stay
Admission: EM | Admit: 2016-04-17 | Discharge: 2016-04-18 | DRG: 291 | Disposition: A | Discharge status: Home / Self Care | Payer: Medicare Other | Attending: Critical Care Medicine | Admitting: Critical Care Medicine

## 2016-04-17 ENCOUNTER — Inpatient Hospital Stay: Payer: Medicare Other | Admitting: Internal Medicine

## 2016-04-17 DIAGNOSIS — I509 Heart failure, unspecified: Secondary | ICD-10-CM

## 2016-04-17 DIAGNOSIS — M19012 Primary osteoarthritis, left shoulder: Secondary | ICD-10-CM | POA: Diagnosis present

## 2016-04-17 DIAGNOSIS — I959 Hypotension, unspecified: Secondary | ICD-10-CM | POA: Diagnosis present

## 2016-04-17 DIAGNOSIS — E860 Dehydration: Secondary | ICD-10-CM | POA: Diagnosis present

## 2016-04-17 DIAGNOSIS — I5023 Acute on chronic systolic (congestive) heart failure: Secondary | ICD-10-CM | POA: Diagnosis present

## 2016-04-17 DIAGNOSIS — M542 Cervicalgia: Secondary | ICD-10-CM

## 2016-04-17 DIAGNOSIS — I5043 Acute on chronic combined systolic (congestive) and diastolic (congestive) heart failure: Secondary | ICD-10-CM | POA: Diagnosis present

## 2016-04-17 DIAGNOSIS — E872 Acidosis: Secondary | ICD-10-CM | POA: Diagnosis present

## 2016-04-17 DIAGNOSIS — I130 Hypertensive heart and chronic kidney disease with heart failure and stage 1 through stage 4 chronic kidney disease, or unspecified chronic kidney disease: Principal | ICD-10-CM | POA: Diagnosis present

## 2016-04-17 DIAGNOSIS — J45909 Unspecified asthma, uncomplicated: Secondary | ICD-10-CM | POA: Diagnosis present

## 2016-04-17 DIAGNOSIS — Z7982 Long term (current) use of aspirin: Secondary | ICD-10-CM

## 2016-04-17 DIAGNOSIS — M1712 Unilateral primary osteoarthritis, left knee: Secondary | ICD-10-CM | POA: Diagnosis present

## 2016-04-17 DIAGNOSIS — E7800 Pure hypercholesterolemia, unspecified: Secondary | ICD-10-CM | POA: Diagnosis present

## 2016-04-17 DIAGNOSIS — E871 Hypo-osmolality and hyponatremia: Secondary | ICD-10-CM | POA: Diagnosis present

## 2016-04-17 DIAGNOSIS — R600 Localized edema: Secondary | ICD-10-CM

## 2016-04-17 DIAGNOSIS — I43 Cardiomyopathy in diseases classified elsewhere: Secondary | ICD-10-CM | POA: Diagnosis present

## 2016-04-17 DIAGNOSIS — R778 Other specified abnormalities of plasma proteins: Secondary | ICD-10-CM

## 2016-04-17 DIAGNOSIS — R519 Headache, unspecified: Secondary | ICD-10-CM

## 2016-04-17 DIAGNOSIS — Z96612 Presence of left artificial shoulder joint: Secondary | ICD-10-CM | POA: Diagnosis present

## 2016-04-17 DIAGNOSIS — N179 Acute kidney failure, unspecified: Secondary | ICD-10-CM | POA: Diagnosis present

## 2016-04-17 DIAGNOSIS — M19011 Primary osteoarthritis, right shoulder: Secondary | ICD-10-CM | POA: Diagnosis present

## 2016-04-17 DIAGNOSIS — N184 Chronic kidney disease, stage 4 (severe): Secondary | ICD-10-CM | POA: Diagnosis present

## 2016-04-17 DIAGNOSIS — E854 Organ-limited amyloidosis: Secondary | ICD-10-CM | POA: Diagnosis present

## 2016-04-17 DIAGNOSIS — K5900 Constipation, unspecified: Secondary | ICD-10-CM | POA: Diagnosis present

## 2016-04-17 DIAGNOSIS — Z23 Encounter for immunization: Secondary | ICD-10-CM

## 2016-04-17 DIAGNOSIS — R748 Abnormal levels of other serum enzymes: Secondary | ICD-10-CM

## 2016-04-17 LAB — PT/INR
PT INR: 1.3 — ABNORMAL HIGH (ref 0.9–1.1)
PT: 16 s — ABNORMAL HIGH (ref 12.6–15.0)

## 2016-04-17 LAB — I-STAT CG4 VENOUS CARTRIDGE
Lactic Acid I-Stat: 3.4 mmol/L — ABNORMAL HIGH (ref 0.2–2.0)
i-STAT Base Excess Venous: -4 mEq/L
i-STAT FIO2: 21
i-STAT HCO3 Bicarbonate Venous: 22.8 mEq/L
i-STAT O2 Saturation Venous: 16 %
i-STAT Patient Temperature: 97.4
i-STAT Total CO2 Venous: 24 mEq/L
i-STAT pCO2 Venous: 44.8
i-STAT pH Venous: 7.312
i-STAT pO2 Venous: 14

## 2016-04-17 LAB — CBC AND DIFFERENTIAL
Absolute NRBC: 0 10*3/uL
Basophils Absolute Automated: 0.02 10*3/uL (ref 0.00–0.20)
Basophils Automated: 0.3 %
Eosinophils Absolute Automated: 0.02 10*3/uL (ref 0.00–0.70)
Eosinophils Automated: 0.3 %
Hematocrit: 46.5 % (ref 42.0–52.0)
Hgb: 15 g/dL (ref 13.0–17.0)
Immature Granulocytes Absolute: 0.03 10*3/uL
Immature Granulocytes: 0.5 %
Lymphocytes Absolute Automated: 0.22 10*3/uL — ABNORMAL LOW (ref 0.50–4.40)
Lymphocytes Automated: 3.5 %
MCH: 32 pg (ref 28.0–32.0)
MCHC: 32.3 g/dL (ref 32.0–36.0)
MCV: 99.1 fL (ref 80.0–100.0)
MPV: 11.5 fL (ref 9.4–12.3)
Monocytes Absolute Automated: 0.06 10*3/uL (ref 0.00–1.20)
Monocytes: 0.9 %
Neutrophils Absolute: 6 10*3/uL (ref 1.80–8.10)
Neutrophils: 94.5 %
Nucleated RBC: 0 /100 WBC (ref 0.0–1.0)
Platelets: 100 10*3/uL — ABNORMAL LOW (ref 140–400)
RBC: 4.69 10*6/uL — ABNORMAL LOW (ref 4.70–6.00)
RDW: 15 % (ref 12–15)
WBC: 6.35 10*3/uL (ref 3.50–10.80)

## 2016-04-17 LAB — COMPREHENSIVE METABOLIC PANEL
ALT: 32 U/L (ref 0–55)
AST (SGOT): 46 U/L — ABNORMAL HIGH (ref 5–34)
Albumin/Globulin Ratio: 1.1 (ref 0.9–2.2)
Albumin: 3.7 g/dL (ref 3.5–5.0)
Alkaline Phosphatase: 166 U/L — ABNORMAL HIGH (ref 38–106)
BUN: 70 mg/dL — ABNORMAL HIGH (ref 9.0–28.0)
Bilirubin, Total: 4.5 mg/dL — ABNORMAL HIGH (ref 0.2–1.2)
CO2: 18 mEq/L — ABNORMAL LOW (ref 22–29)
Calcium: 9.1 mg/dL (ref 8.5–10.5)
Chloride: 101 mEq/L (ref 100–111)
Creatinine: 2.8 mg/dL — ABNORMAL HIGH (ref 0.7–1.3)
Globulin: 3.4 g/dL (ref 2.0–3.6)
Glucose: 80 mg/dL (ref 70–100)
Potassium: 4.7 mEq/L (ref 3.5–5.1)
Protein, Total: 7.1 g/dL (ref 6.0–8.3)
Sodium: 134 mEq/L — ABNORMAL LOW (ref 136–145)

## 2016-04-17 LAB — URINALYSIS, REFLEX TO MICROSCOPIC EXAM IF INDICATED
Bilirubin, UA: NEGATIVE
Glucose, UA: NEGATIVE
Ketones UA: NEGATIVE
Leukocyte Esterase, UA: NEGATIVE
Nitrite, UA: NEGATIVE
Protein, UR: 100 — AB
Specific Gravity UA: 1.01 (ref 1.001–1.035)
Urine pH: 6 (ref 5.0–8.0)
Urobilinogen, UA: NORMAL mg/dL

## 2016-04-17 LAB — B-TYPE NATRIURETIC PEPTIDE: B-Natriuretic Peptide: 4082 pg/mL — ABNORMAL HIGH (ref 0–100)

## 2016-04-17 LAB — TROPONIN I: Troponin I: 1.07 ng/mL (ref 0.00–0.09)

## 2016-04-17 LAB — MAGNESIUM: Magnesium: 1.8 mg/dL (ref 1.6–2.6)

## 2016-04-17 LAB — I-STAT TROPONIN: i-STAT Troponin: 0.46 ng/mL — ABNORMAL HIGH (ref 0.00–0.09)

## 2016-04-17 LAB — LACTIC ACID, PLASMA: Lactic Acid: 3.6 mmol/L — ABNORMAL HIGH (ref 0.2–2.0)

## 2016-04-17 LAB — IHS D-DIMER: D-Dimer: 2.99 ug/mL FEU — ABNORMAL HIGH (ref 0.00–0.51)

## 2016-04-17 LAB — GFR: EGFR: 27.4

## 2016-04-17 MED ORDER — POLYETHYLENE GLYCOL 3350 17 G PO PACK
17.0000 g | PACK | Freq: Every day | ORAL | Status: DC
Start: 2016-04-17 — End: 2016-04-18
  Administered 2016-04-17: 17 g via ORAL
  Filled 2016-04-17: qty 1

## 2016-04-17 MED ORDER — FLUTICASONE PROPIONATE 50 MCG/ACT NA SUSP
2.0000 | Freq: Every day | NASAL | Status: DC
Start: 2016-04-18 — End: 2016-04-18
  Administered 2016-04-18: 2 via NASAL
  Filled 2016-04-17: qty 16

## 2016-04-17 MED ORDER — PATIENT SUPPLIED NON FORMULARY
5.0000 mg | Status: DC
Start: 2016-04-18 — End: 2016-04-18

## 2016-04-17 MED ORDER — NALOXONE HCL 0.4 MG/ML IJ SOLN (WRAP)
0.2000 mg | INTRAMUSCULAR | Status: DC | PRN
Start: 2016-04-17 — End: 2016-04-18

## 2016-04-17 MED ORDER — ASPIRIN 81 MG PO TBEC
81.0000 mg | DELAYED_RELEASE_TABLET | Freq: Every day | ORAL | Status: DC
Start: 2016-04-18 — End: 2016-04-18
  Administered 2016-04-18: 81 mg via ORAL
  Filled 2016-04-17: qty 1

## 2016-04-17 MED ORDER — ACETAMINOPHEN 325 MG PO TABS
325.0000 mg | ORAL_TABLET | ORAL | Status: DC | PRN
Start: 2016-04-17 — End: 2016-04-18

## 2016-04-17 MED ORDER — POLYETHYLENE GLYCOL 3350 17 G PO PACK
17.0000 g | PACK | Freq: Every day | ORAL | Status: DC
Start: 2016-04-18 — End: 2016-04-17

## 2016-04-17 MED ORDER — DOPAMINE-DEXTROSE 1.6-5 MG/ML-% IV SOLN
10.0000 ug/kg/min | INTRAVENOUS | Status: DC
Start: 2016-04-17 — End: 2016-04-17

## 2016-04-17 MED ORDER — HEPARIN SODIUM (PORCINE) 5000 UNIT/ML IJ SOLN
5000.0000 [IU] | Freq: Three times a day (TID) | INTRAMUSCULAR | Status: DC
Start: 2016-04-17 — End: 2016-04-18
  Administered 2016-04-17 – 2016-04-18 (×2): 5000 [IU] via SUBCUTANEOUS
  Filled 2016-04-17 (×2): qty 1

## 2016-04-17 MED ORDER — SODIUM CHLORIDE 0.9 % IV BOLUS
500.0000 mL | Freq: Once | INTRAVENOUS | Status: AC
Start: 2016-04-17 — End: 2016-04-17
  Administered 2016-04-17: 500 mL via INTRAVENOUS

## 2016-04-17 MED ORDER — ASPIRIN 81 MG PO CHEW
324.0000 mg | CHEWABLE_TABLET | Freq: Once | ORAL | Status: AC
Start: 2016-04-17 — End: 2016-04-17
  Administered 2016-04-17: 324 mg via ORAL
  Filled 2016-04-17: qty 4

## 2016-04-17 MED ORDER — INFLUENZA VAC SPLIT HIGH-DOSE 0.5 ML IM SUSY
0.5000 mL | PREFILLED_SYRINGE | Freq: Once | INTRAMUSCULAR | Status: DC
Start: 2016-04-17 — End: 2016-04-18
  Filled 2016-04-17: qty 0.5

## 2016-04-17 NOTE — ED Provider Notes (Signed)
[facility] EMERGENCY DEPARTMENT H&P      Visit date: 04/17/2016      CLINICAL SUMMARY          Diagnosis:    .     Final diagnoses:   Elevated troponin   Hypotension, unspecified hypotension type   Nonintractable headache, unspecified chronicity pattern, unspecified headache type   Neck pain   Bilateral lower extremity edema   Dehydration   Acute on chronic congestive heart failure, unspecified congestive heart failure type         MDM Notes:      Gregory [name] is a 70 y.o. male w/phmx CHF, HTN, high cholesterol p/w headache and neck pain both resolved and c/o hypotension and tachycardia. Check labs for electrolyte abnormality, anemia, infection, heart failure exacerbation. Very gently IV fluids, considered pressors and consultation w/cardiology. Chest xray to evaluate pneumonia or cardiomegaly, low threshold to admit.         Disposition:          ED Disposition     ED Disposition Condition Date/Time Comment    Admit  Wed Apr 17, 2016  4:02 PM Admitting Physician: Judye Bos [[id number]]   Diagnosis: Elevated troponin [432045]   Estimated Length of Stay: > or = to 2 midnights   Tentative Discharge Plan?: Home or Self Care [1]   Patient Class: Inpatient [101]                          CLINICAL INFORMATION        HPI:      Chief Complaint: Chills  .    Gregory [name] is a 70 y.o. male w/pmhx asthma, arthritis, CHF, malignant neoplasm, amyloidosis who presents with headache and neck pain. Neck pain expands whole circumference of neck, now hurting in the back. Headache gone in ED. Today constipated. Never had pain like this before. Headache described as pressure.   Is followed at [facility] for his Amyloidosis.  Denies n/v/d/f, vision changes, rash, urinary sx.    History obtained from: Patient          ROS:      Review of Systems   Constitutional: Positive for chills. Negative for fatigue.   HENT: Negative for rhinorrhea.    Eyes: Negative for visual disturbance.   Respiratory:  Negative for cough, shortness of breath and wheezing.    Cardiovascular: Negative for chest pain and leg swelling.   Gastrointestinal: Negative for abdominal pain, constipation, diarrhea, nausea and vomiting.   Genitourinary: Negative for dysuria.   Musculoskeletal: Negative for back pain and myalgias.   Skin: Negative for rash.   Neurological: Positive for headaches.           Physical Exam:      Pulse (!) 113  BP 141/60  Resp (!) 32  SpO2 98 %  Temp 97.4 F (36.3 C)    Physical Exam   Constitutional: He is oriented to person, place, and time. He appears well-developed and well-nourished. No distress.   HENT:   Head: Normocephalic and atraumatic.   Eyes: Conjunctivae are normal. Pupils are equal, round, and reactive to light.   Neck: Neck supple. No tracheal deviation present.   Cardiovascular: Normal rate, normal heart sounds and intact distal pulses.    No murmur heard.  Pulmonary/Chest: Effort normal and breath sounds normal. No respiratory distress. He has no wheezes.   Abdominal: Soft. Bowel sounds are normal. He exhibits no distension and  no mass. There is no tenderness.   Musculoskeletal: He exhibits no edema or tenderness.   Neurological: He is alert and oriented to person, place, and time.   Skin: Skin is warm and dry. He is not diaphoretic.   +1 pitting edema B/L   Psychiatric: He has a normal mood and affect.   Nursing note and vitals reviewed.                 PAST HISTORY        Primary Care Provider: [name], MD        PMH/PSH:    .     Past Medical History:   Diagnosis Date   . Amyloidosis    . Arthritis     bilat shoulders, lt knee   . Asthma without status asthmaticus     childhood   . CHF (congestive heart failure)    . Fracture of unspecified bones     1974 lt shoulder   . Malignant neoplasm        He has a past surgical history that includes Colonoscopy; EXCISION, GANGLION (12/19/2012); INJECTION, MEDICATION (12/19/2012); Hernia repair; and ARTHROPLASTY, SHOULDER, TOTAL (06/18/2013).       Social/Family History:      He reports that he has never smoked. He has never used smokeless tobacco. He reports that he does not drink alcohol or use drugs.    Family History   Problem Relation Age of Onset   . Family history unknown: Yes         Listed Medications on Arrival:    .     Home Medications     Med List Status:  In Progress Set By: Ilda Mori, RN at 04/17/2016 11:59 AM                aspirin EC 81 MG EC tablet     Take 81 mg by mouth daily.     bumetanide (BUMEX) 2 MG tablet     Take 6 mg by mouth 2 (two) times daily.     dexamethasone (DECADRON) 2 MG tablet     Take 10 mg by mouth See Admin Instructions.Given on days 1-21 of chemo cycle     fluticasone (FLONASE) 50 MCG/ACT nasal spray     2 sprays by Nasal route daily.     Lenalidomide (REVLIMID) 5 MG Cap     Take 5 mg by mouth.Days 1-21. Hold for 7 days. For Amyloidosis     metOLazone (ZAROXOLYN) 5 MG tablet     Take 5 mg by mouth as needed (PRN Sun, Tues Thus if weight >135).     potassium chloride (K-TAB,KLOR-CON) 10 MEQ tablet     Take 10 mEq by mouth 2 (two) times daily.     prochlorperazine (COMPAZINE) 10 MG tablet     Take 10 mg by mouth every 6 (six) hours as needed (Nausea).                   Allergies: He has No Known Allergies.            VISIT INFORMATION        Clinical Course in the ED:          ED Course as of Apr 17 1736   Wed Apr 17, 2016   1311 Believe this to be overdiurese of patient and not sepsis, he has demand ischemia. Will give gentle hydration and trend troponins  [JB]   1323 Trop  0.4  [JB]   1423 Lab called about bnp, they cannot find the sticker, they will work on it  [JB]   1433 Spoke w/Thathagari for admission  [AK]   1534 Spoke with cards, recc upgrade to ICU and their pressor of choice is dopamine at this time  [JB]   1553 Spoke w/Dr. Noberto Retort, if upgraded to ICU, will call [facility].  [AK]   7035 KKXFG w/[facility] about admission. Will call on call cardiology for consulting.  [AK]      ED Course User Index  [AK] Mercie Eon  [JB] Perry Mount, MD     Critical Care Time (not including procedures): 30-74 minutes.   Due to the high risk of critical illness or multi-organ failure at initial presentation and/or during ED course.    System(s) at risk for compromise:  circulatory and respiratory  Critical Diagnosis:   1. Elevated troponin    2. Hypotension, unspecified hypotension type    3. Nonintractable headache, unspecified chronicity pattern, unspecified headache type    4. Neck pain    5. Bilateral lower extremity edema    6. Dehydration    7. Acute on chronic congestive heart failure, unspecified congestive heart failure type         The patient was Hypotensive:   Yes   The patient was Hypoxic:   No     This does not including time spent performing other reported procedures or services.   Critical care time involved full attention to the patient's condition and included:   Review of nursing notes and/or old charts - Yes  Documentation time - Yes  Care, transfer of care, and discharge plans - Yes  Obtaining necessary history from family, EMS, nursing home staff and/or treating physicians - Yes  Review of medications, allergies, and vital signs - Yes   Consultant collaboration on findings and treatment options - Yes  Ordering, interpreting, and reviewing diagnostic studies/tab tests - Yes         Medications Given in the ED:    .     ED Medication Orders     Start Ordered     Status Ordering Provider    04/17/16 1533 04/17/16 1532  DOPamine (INTROPIN) 400 mg in dextrose 5% 250 mL infusion (premix)  Continuous     Route: Intravenous  Ordered Dose: 10 mcg/kg/min     Acknowledged Tonna Palazzi L    04/17/16 1442 04/17/16 1441  sodium chloride 0.9 % bolus 500 mL  Once     Route: Intravenous  Ordered Dose: 500 mL     Last MAR action:  New Bag Derrisha Foos L    04/17/16 1423 04/17/16 1422  sodium chloride 0.9 % bolus 500 mL  Once     Route: Intravenous  Ordered Dose: 500 mL     Last MAR action:  Stopped Maribelle Hopple L     04/17/16 1325 04/17/16 1324  aspirin chewable tablet 324 mg  Once     Route: Oral  Ordered Dose: 324 mg     Last MAR action:  Given Bowe Sidor L    04/17/16 1301 04/17/16 1300  sodium chloride 0.9 % bolus 500 mL  Once     Route: Intravenous  Ordered Dose: 500 mL     Last MAR action:  Stopped Shravan Salahuddin L            Procedures:      Procedures      Interpretations:  O2 sat-           saturation: 98 %; Oxygen use: room air; Interpretation: Normal    Clinical scores   NSR, no stemi  Rate: 119  PR 138  QRS 82  QTC 402            RESULTS        Lab Results:      Results     Procedure Component Value Units Date/Time    D-Dimer [[id number]]  (Abnormal) Collected:  04/17/16 1311     Updated:  04/17/16 1717     D-Dimer 2.99 (H) ug/mL FEU     Blood Culture Aerobic and Anaerobic (age 69 or older) [[id number]] Collected:  04/17/16 1321    Specimen:  Blood from Blood Updated:  04/17/16 1639    Narrative:       1 BLUE+1 PURPLE    B-type Natriuretic Peptide [[id number]]  (Abnormal) Collected:  04/17/16 1311    Specimen:  Blood Updated:  04/17/16 1458     B-Natriuretic Peptide 4,082 (H) pg/mL     CBC with differential [[id number]]  (Abnormal) Collected:  04/17/16 1311    Specimen:  Blood from Blood Updated:  04/17/16 1426     WBC 6.35 x10 3/uL      Hgb 15.0 g/dL      Hematocrit 46.5 %      Platelets 100 (L) x10 3/uL      RBC 4.69 (L) x10 6/uL      MCV 99.1 fL      MCH 32.0 pg      MCHC 32.3 g/dL      RDW 15 %      MPV 11.5 fL      Neutrophils 94.5 %      Lymphocytes Automated 3.5 %      Monocytes 0.9 %      Eosinophils Automated 0.3 %      Basophils Automated 0.3 %      Immature Granulocyte 0.5 %      Nucleated RBC 0.0 /100 WBC      Neutrophils Absolute 6.00 x10 3/uL      Abs Lymph Automated 0.22 (L) x10 3/uL      Abs Mono Automated 0.06 x10 3/uL      Abs Eos Automated 0.02 x10 3/uL      Absolute Baso Automated 0.02 x10 3/uL      Absolute Immature Granulocyte 0.03 x10 3/uL      Absolute NRBC 0.00 x10 3/uL     UA, Reflex to  Microscopic (pts  3 + yrs) [[id number]]  (Abnormal) Collected:  04/17/16 1321    Specimen:  Urine Updated:  04/17/16 1410     Urine Type Clean Catch     Color, UA Yellow     Clarity, UA Clear     Specific Gravity UA 1.010     Urine pH 6.0     Leukocyte Esterase, UA Negative     Nitrite, UA Negative     Protein, UR 100 (A)     Glucose, UA Negative     Ketones UA Negative     Urobilinogen, UA Normal mg/dL      Bilirubin, UA Negative     Blood, UA Small (A)     RBC, UA 3 - 5 /hpf      Hyaline Casts, UA 6 - 10 (A) /lpf      Oval Fat Bodies, UA Rare (A) /hpf     Prothrombin time/INR [  [id number]]  (Abnormal) Collected:  04/17/16 1311    Specimen:  Blood Updated:  04/17/16 1404     PT 16.0 (H) sec      PT INR 1.3 (H)     PT Anticoag. Given Within 48 hrs. None    Comprehensive metabolic panel [[id number]]  (Abnormal) Collected:  04/17/16 1311    Specimen:  Blood Updated:  04/17/16 1352     Glucose 80 mg/dL      BUN 70.0 (H) mg/dL      Creatinine 2.8 (H) mg/dL      Sodium 134 (L) mEq/L      Potassium 4.7 mEq/L      Chloride 101 mEq/L      CO2 18 (L) mEq/L      Calcium 9.1 mg/dL      Protein, Total 7.1 g/dL      Albumin 3.7 g/dL      AST (SGOT) 46 (H) U/L      ALT 32 U/L      Alkaline Phosphatase 166 (H) U/L      Bilirubin, Total 4.5 (H) mg/dL      Globulin 3.4 g/dL      Albumin/Globulin Ratio 1.1    Magnesium [[id number]] Collected:  04/17/16 1311    Specimen:  Blood Updated:  04/17/16 1352     Magnesium 1.8 mg/dL     GFR [[id number]] Collected:  04/17/16 1311     Updated:  04/17/16 1352     EGFR 27.4    i-Stat CG4 Venous CartrIDge [[id number]]  (Abnormal) Collected:  04/17/16 1311     Updated:  04/17/16 1325     i-STAT pH Venous 7.312     i-STAT pCO2 Venous 44.8     i-STAT pO2 Venous 14.0     i-STAT HCO3 Bicarbonate Venous 22.8 mEq/L      i-STAT Total CO2 Venous 24.0 mEq/L      i-STAT Base Excess Venous -4.0 mEq/L      i-STAT O2 Saturation Venous 16.0 %      i-STAT Lactic acid 3.4 (H) mmol/L      i-STAT Patient Temperature 97.4      i-STAT FIO2 21     i-STAT Allen's Test NA     i-STAT Draw Site Venous    i-Stat Troponin [[id number]]  (Abnormal) Collected:  04/17/16 1313     Updated:  04/17/16 1325     i-STAT Troponin 0.46 (H) ng/mL               Radiology Results:      CT Head without Contrast   Final Result    Stable examination of the brain without evidence of acute   intracranial pathology. Please see discussion above for details.      Prentice Docker, MD    04/17/2016 2:04 PM         XR Chest  AP Portable   Final Result    No acute findings.      [name], MD    04/17/2016 1:47 PM                     Scribe Attestation:      I was acting as a Education administrator for Perry Mount, MD on Gregory [name]  Treatment Team: Scribe: Mercie Eon     I am the first provider for this patient and I personally performed the services documented. Treatment Team: Scribe: Mercie Eon is scribing for me on Gregory [name],Gregory [name].  This note and the patient instructions accurately reflect work and decisions made by me.  Perry Mount, MD            Perry Mount, MD  04/17/16 [phone]

## 2016-04-17 NOTE — H&P (Signed)
Patient's Name: Gregory [name]   Attending Provider: Nada Libman, *  Admit Date:04/17/2016  Medical Record Number: [id number]   Room:  N 42/N 42  Date/Time: 04/17/16 4:04 PM    Chief complaint:  Acute decompensated diastolic CHF    Hospitalist: Dr. Cleophas Dunker    Cardiologist: Dr. Maylon Peppers    History of present illness:   [name] is a 70 y.o. male with PMH of amyloidosis complicated by CHF (44-62%), CKD Stage 4, asthma, HTN who presents with headache, LE edema, and hypotension, thought to be in acute decompensated heart failure. Pt initally admitted to CCU for concern for cardiogenic shock.     Pt initially presented to hospital due to severe neck pain and headache. CT head negative for acute intracranial process. Trops elevated at 0.46.     Pt initially admitted at [facility] from 86/38-17/71 for acute systolic heart failure. BNP noted to be >17,000 and CXR showing volume overload. Lactate during that admission was also elevated at 2.4. Echo that admission showed EF of 25-30% with concentric hypertrophy of LV with severely decreased LV systolic function and moderate-severe global hypokinesis of LV. RV is mildly dilated with moderate-severe global hypokinesis of RV. Small pericardial effusion. EKG 10/17 shows NSR with right axis deviation. Recent B/L LE dopplers on 10/27 show mild subcutaenous edema without any evidence of acute DVT. He was discharged on Bumex 6 mg PO BID.  [place] on 10/15 showed redemonstration of small amount of free fluid in the abdomen with mildly dilated IVC/central hepatic vein which may represent mild volume overload related to congestive heart failure, as well as normal appearance of both kidneys without hydronephrosis.     Endorses three-pillow orthopnea, PND, and shortness of breath. He requires sleeping in a recliner due to his profound shortness of breath with lying flat. Otherwise, also endorses recent weight gain of 5lb in past week. His dry weight is 135. He takes  Metolazone PRN on _0 76 lt shoulder   . Malignant neoplasm        Past Surgical History:    Past Surgical History:   Procedure Laterality Date   . ARTHROPLASTY, SHOULDER, TOTAL  06/18/2013    Procedure: ARTHROPLASTY, SHOULDER, TOTAL;  Surgeon: Wendi Snipes, MD;  Location: [facility];  Service: Orthopedics;  Laterality: Left;   . COLONOSCOPY     . EXCISION, GANGLION  12/19/2012    Procedure: EXCISION, GANGLION;  Surgeon: Jillyn Hidden, MD;  Location: [facility];  Service: Orthopedics;  Laterality: Right;  injection right and left shoulder   . HERNIA REPAIR      lt inguinal age 78   . INJECTION, MEDICATION  12/19/2012    Procedure: INJECTION, MEDICATION;  Surgeon: Jillyn Hidden, MD;  Location: [facility];  Service: Orthopedics;  Laterality: Bilateral;       Family History:  Family History   Problem Relation Age of Onset   .  Family history unknown: Yes       Social History:  Social History     Social History   . Marital status: Married     Spouse name: N/A   . Number of children: N/A   . Years of education: N/A     Social History Main Topics   . Smoking status: Never Smoker   . Smokeless tobacco: Never Used   . Alcohol use No   . Drug use: No   . Sexual activity: Not on file     Other Topics Concern   . Not on file     Social History Narrative   . No narrative on file       Home medications:      Prior to Admission medications    Medication Sig Start Date End Date Taking? Authorizing Provider   carvedilol (COREG) 3.125 MG tablet Take 3.125 mg by mouth.     [provider]   dexamethasone (DECADRON) 4 MG tablet Take 4 mg by mouth.    [provider]   digoxin (LANOXIN) 0.125 MG tablet Take 125 mcg by mouth.    [provider]   fluticasone (FLOVENT DISKUS) 50 MCG/BLIST diskus inhaler Inhale into the lungs daily as needed.    [provider]   furosemide (LASIX) 40 MG tablet Take 40 mg by mouth 2 (two) times daily.    [provider]   lisinopril (PRINIVIL,ZESTRIL) 2.5 MG tablet Take 2.5 mg by mouth daily.    [provider]   polyethylene glycol (MIRALAX) powder Take 17 g by mouth daily as needed (as needed for constipation). 0.8 g/kg/day  MAX 17 g/day 07/29/14   Starla Link M, [place]   sulfamethoxazole-trimethoprim (BACTRIM DS,SEPTRA DS) 800-160 MG per tablet Take 1 tablet by mouth. Mondays-Wednesdays-Fridays    [provider]   torsemide (DEMADEX) 20 MG tablet Take 40 mg by mouth daily.    [provider]        Inpatient/ER medications:    (Not in a hospital admission)    Allergies:  No Known Allergies    Current Inpatient :  Current Facility-Administered Medications   Medication Dose Route Frequency   . sodium chloride  500 mL Intravenous Once         Review of systems:  No constitutional complaints, no respiratory complaints, no musculoskeletal complaints, or other pertinent review of symptoms except as noted in the history of present illness.    Physical Exam:  Temp:  [97.4 F (36.3 C)] 97.4 F (36.3 C)  Heart Rate:  [107-113] 107  Resp Rate:  [16-32] 20  BP: (85-141)/(56-65) 96/64   Pulse ox: 9^% on RA     No intake or output data in the 24 hours ending 04/17/16 1604  General: Healthy-appearing, in no distress.  Alert and oriented x 3  HEENT: no JVD, no carotid bruits  Pulmonary: Normal respiratory effort, clear lung sounds bilaterally.  Cardiovascular: S3 present, normal S1 and S2, no murmurs or rubs  Abdomen: Soft, nontender, nondistended   Extremities: 2+ pitting edema in B/L LEs  from feet to knees  Skin: No tendinous xanthomas  Psych:  Mood was normal  Neuro:  otherwise non-focal, gait normal    EKG:   Previous: 10/17- NSR with right axis deviation.  ER: Sinus tachycardia, low voltage. Q waves n leads II, III, and aVF.     Laboratory:    Recent Labs      04/17/16  1311   WBC  6.35   Hgb  15.0   Hematocrit  46.5   Platelets  100*     Recent Labs      04/17/16   1311   PT  16.0*   PT INR  1.3*     Recent Labs      04/17/16   1311   Sodium  134*   Potassium  4.7   Chloride  101   CO2  18*   BUN  70.0*   Calcium  9.1   Magnesium  1.8       Echocardiogram:    03/2016: Echo that admission showed EF of 25-30% with concentric hypertrophy of LV with severely decreased LV systolic function and moderate-severe global hypokinesis of LV. RV is mildly dilated with moderate-severe global hypokinesis of RV. Small pericardial effusion.    Imaging:  chest X-ray: no acute cardiopulmonary findings    ASSESSMENT AND PLAN:     Mr. TYKEL BADIE is a 70 y.o. male with PMH of amyloidosis complicated by CHF (92-92%), CKD Stage 4, asthma, HTN who presents with acute decompensated systolic heart failure. Pt current HDS, with low concern for cardiogenic shock. Withholding initiation of pressors at this time.    Neuro:  #Headaches: CT head negative. Etiology unclear, could be 2/2 amyloidosis medication (Revlimid)   - tylenol PRN for pain control    Cardio:  #Systolic CHF 2/2 amyloidosis: Echo 03/2016 shows EF of 25-30% .Trops elevated at 0.46, likely 2/2 demand ischemia  - hold diuresis for now in setting of hypotension  - trend troponins q6 until peak  - holding beta blocker, ACE-I due to hypotension (pt was not on these medications outpatient)    #Hypotension, 2/2 systolic CHF exacerbation. Currently stable. MAP > 70, low concern for cardiogenic shock  - recheck lactate  - check AM cortisol  - place PICC in case pressors need to be initiated    Resp: NAI    Renal /Fluid, Electrolytes:  #CKD: baseline 2.9  (currently at baseline)  - nephrology on board, appreciate recs  - avoid nephrotoxic agents    GI: NAI    Nutrition: cardiac diet    Infectious Disease (ID):  #Lactic acidosis, etiology unclear  - recheck lactate  - F/U blood cultures    Hem/Onc:  #Amyloidosis  - continue Revlimid 5 mg PO qOD starting 11/2 (patient supplied med)  - gets Decadron 4 mg PO weekly (last received on 10/31)    Endo: NAI- last HbA1C 5.5 in 03/2016      Prophylaxis:  - VTE: SQ heparin  - PPI:  Not indicated  - BM: not indicated    Lines/Drains/Airways:     Peripheral IV 04/17/16 Right Antecubital (Active)   Site Assessment Clean;Dry;Intact 04/17/2016  1:32 PM   Line Status Saline Locked;Flushed;Blood return noted 04/17/2016  1:32 PM   Dressing Status Clean;Dry;Intact 04/17/2016  1:32 PM   Reason Not Rotated Not due 04/17/2016  1:32 PM   Number of days: 0        Code Status: full code    Dispo: CCU    Signed by: Marilynne Halsted, MD  Date/Time: 04/17/16 4:04 PM

## 2016-04-17 NOTE — ED Notes (Signed)
Elevated lactate not thought to be due to sepsis after huddle with MD Stevenson Clinch.

## 2016-04-17 NOTE — Consults (Signed)
Consultation    Date Time: 04/17/16 4:06 PM  Patient Name: Gregory [name], Gregory [name]  Requesting Physician: Nada Libman, *       Reason for Consultation:   To evaluate and treat ckd        History:   Gregory [name] is a 70 y.o. male who presents to the hospital on 04/17/2016 with neck paina nd being admitted for likley chf.  Patient was noticed to have creatinine of 2.9 today.  His baseline creatinine runs around 2.9 as of labs from [facility].  He did not have any dye studies and denies use of nsaids.  his Bp has been lowish here.  His UO has been good with no urinary symptoms.  He sees nephrologya [name] [facility] and was dxed as cardiorenal syndrome with ckd4. He also has associated amyloidosis and has been on rx at [facility] for >2years and is currently on decadron.      Past Medical History:     Past Medical History:   Diagnosis Date   . Amyloidosis    . Arthritis     bilat shoulders, lt knee   . Asthma without status asthmaticus     childhood   . CHF (congestive heart failure)    . Fracture of unspecified bones     1974 lt shoulder   . Malignant neoplasm        Past Surgical History:     Past Surgical History:   Procedure Laterality Date   . ARTHROPLASTY, SHOULDER, TOTAL  06/18/2013    Procedure: ARTHROPLASTY, SHOULDER, TOTAL;  Surgeon: Wendi Snipes, MD;  Location: [facility];  Service: Orthopedics;  Laterality: Left;   . COLONOSCOPY     . EXCISION, GANGLION  12/19/2012    Procedure: EXCISION, GANGLION;  Surgeon: Jillyn Hidden, MD;  Location: [facility];  Service: Orthopedics;  Laterality: Right;  injection right and left shoulder   . HERNIA REPAIR      lt inguinal age 85   . INJECTION, MEDICATION  12/19/2012    Procedure: INJECTION, MEDICATION;  Surgeon: Jillyn Hidden, MD;  Location: [facility];  Service: Orthopedics;  Laterality: Bilateral;       Family History:     Family History   Problem Relation Age of Onset   . Family history unknown: Yes       Social History:     Social History     Social History   .  Marital status: Married     Spouse name: N/A   . Number of children: N/A   . Years of education: N/A     Social History Main Topics   . Smoking status: Never Smoker   . Smokeless tobacco: Never Used   . Alcohol use No   . Drug use: No   . Sexual activity: Not on file     Other Topics Concern   . Not on file     Social History Narrative   . No narrative on file       Allergies:   No Known Allergies    Medications:     Current Facility-Administered Medications   Medication Dose Route Frequency   . sodium chloride  500 mL Intravenous Once         . DOPamine         Prior to Admission medications    Medication Sig Start Date End Date Taking? Authorizing Provider   carvedilol (COREG) 3.125 MG tablet Take 3.125 mg  by mouth.    [provider]   dexamethasone (DECADRON) 4 MG tablet Take 4 mg by mouth.    [provider]   digoxin (LANOXIN) 0.125 MG tablet Take 125 mcg by mouth.    [provider]   fluticasone (FLOVENT DISKUS) 50 MCG/BLIST diskus inhaler Inhale into the lungs daily as needed.    [provider]   furosemide (LASIX) 40 MG tablet Take 40 mg by mouth 2 (two) times daily.    [provider]   lisinopril (PRINIVIL,ZESTRIL) 2.5 MG tablet Take 2.5 mg by mouth daily.    [provider]   polyethylene glycol (MIRALAX) powder Take 17 g by mouth daily as needed (as needed for constipation). 0.8 g/kg/day  MAX 17 g/day 07/29/14   Starla Link M, [place]   sulfamethoxazole-trimethoprim (BACTRIM DS,SEPTRA DS) 800-160 MG per tablet Take 1 tablet by mouth. Mondays-Wednesdays-Fridays    [provider]   torsemide (DEMADEX) 20 MG tablet Take 40 mg by mouth daily.    [provider]        Review of Systems:   A comprehensive review of systems was: General ROS: negative  Respiratory ROS: no cough, shortness of breath, or wheezing  Cardiovascular ROS: no chest pain or dyspnea on exertion  Gastrointestinal ROS: no abdominal pain, change in bowel habits, or  black or bloody stools  Genito-Urinary ROS: no dysuria, trouble voiding, or hematuria        Physical Exam:   Patient Vitals for the past 24 hrs:   BP Temp Temp src Pulse Resp SpO2 Height Weight   04/17/16 1530 96/64 - - (!) 107 20 98 % - -   04/17/16 1500 91/65 - - (!) 108 16 97 % - -   04/17/16 1434 (!) 85/56 - - (!) 108 16 95 % - -   04/17/16 1331 99/65 - - (!) 110 16 97 % - -   04/17/16 1230 97/63 - - (!) 112 19 96 % - -   04/17/16 1156 141/60 97.4 F (36.3 C) Oral (!) 113 (!) 32 98 % 1.676 m (_0 ) 61.7 kg (136 lb)       Intake and Output Summary (Last 24 hours) at Date Time  No intake or output data in the 24 hours ending 04/17/16 1606    No intake/output data recorded.    Gen: Well developed and no distress  Mouth: mucosa moist, no lesions on lips  Neck: No thyromegatly or carotid bruits  Lungs: Clear, breath sounds decreased  Heart: RRR, no Murmurs or rubs  Abd: Soft, nontender, bowel sounds present  Skin: Turgor good, no rashes  Extremites: 4+ edema present and no stasis changes  Joints: No inflammation or deformites of ankles, knees or fingers  Psych: Alert, Oriented to person, place, speech good      Labs Reviewed:     Recent Labs  Lab 04/17/16  1311   WBC 6.35   RBC 4.69*   Hgb 15.0   Hematocrit 46.5   MCV 99.1   MCHC 32.3   RDW 15   MPV 11.5   Platelets 100*         Recent Labs  Lab 04/17/16  1311   Sodium 134*   Potassium 4.7   Chloride 101   CO2 18*   BUN 70.0*   Creatinine 2.8*   Glucose 80   Calcium 9.1   Magnesium 1.8  Recent Labs  Lab 04/17/16  1311   PT INR 1.3*   PT 16.0*         Recent Labs  Lab 04/17/16  1311   ALT 32   AST (SGOT) 46*   Bilirubin, Total 4.5*   Albumin 3.7   Alkaline Phosphatase 166*                           Rads:   Radiological Procedure reviewed.     Signed by: [name], MD      Assessment/ Plan:     CKD3/4: Could very well be due to amyloidosis or cardiorenal syndrome given his acute CHF:  Continue to follow labs and diurese as tolearted. Would avoid  acei, arbs, dye studies and nsaids.  Dose meds for gfr<20    CHF: ?Due to cardiac amyloid.  Await cards input.  Would start bumex iv and add metolazone     Amyloidosis: Continue steroid and may need spep/upep    Hypotension: Would consider midodrine    Hyponatremia:Hypervolemic.  As above    [name], MD  [phone]

## 2016-04-17 NOTE — ED Notes (Signed)
Bed: N 42  Expected date:   Expected time:   Means of arrival: [id number] - [place]  Comments:  [phone]

## 2016-04-18 ENCOUNTER — Inpatient Hospital Stay: Payer: Medicare Other

## 2016-04-18 DIAGNOSIS — H2513 Age-related nuclear cataract, bilateral: Secondary | ICD-10-CM | POA: Diagnosis not present

## 2016-04-18 DIAGNOSIS — H40013 Open angle with borderline findings, low risk, bilateral: Secondary | ICD-10-CM | POA: Diagnosis not present

## 2016-04-18 DIAGNOSIS — H21233 Degeneration of iris (pigmentary), bilateral: Secondary | ICD-10-CM | POA: Diagnosis not present

## 2016-04-18 DIAGNOSIS — I429 Cardiomyopathy, unspecified: Secondary | ICD-10-CM

## 2016-04-18 DIAGNOSIS — I959 Hypotension, unspecified: Secondary | ICD-10-CM

## 2016-04-18 LAB — ECG 12-LEAD
Atrial Rate: 119 {beats}/min
P Axis: 50 degrees
P-R Interval: 138 ms
Q-T Interval: 286 ms
QRS Duration: 82 ms
QTC Calculation (Bezet): 402 ms
R Axis: 213 degrees
T Axis: 69 degrees
Ventricular Rate: 119 {beats}/min

## 2016-04-18 LAB — BASIC METABOLIC PANEL
BUN: 68 mg/dL — ABNORMAL HIGH (ref 9.0–28.0)
CO2: 15 mEq/L — ABNORMAL LOW (ref 22–29)
Calcium: 8.7 mg/dL (ref 8.5–10.5)
Chloride: 105 mEq/L (ref 100–111)
Creatinine: 2.7 mg/dL — ABNORMAL HIGH (ref 0.7–1.3)
Glucose: 107 mg/dL — ABNORMAL HIGH (ref 70–100)
Potassium: 4.8 mEq/L (ref 3.5–5.1)
Sodium: 132 mEq/L — ABNORMAL LOW (ref 136–145)

## 2016-04-18 LAB — CBC AND DIFFERENTIAL
Absolute NRBC: 0 10*3/uL
Basophils Absolute Automated: 0.02 10*3/uL (ref 0.00–0.20)
Basophils Automated: 0.2 %
Eosinophils Absolute Automated: 0.02 10*3/uL (ref 0.00–0.70)
Eosinophils Automated: 0.2 %
Hematocrit: 42 % (ref 42.0–52.0)
Hgb: 14 g/dL (ref 13.0–17.0)
Immature Granulocytes Absolute: 0.04 10*3/uL
Immature Granulocytes: 0.4 %
Lymphocytes Absolute Automated: 0.62 10*3/uL (ref 0.50–4.40)
Lymphocytes Automated: 6.9 %
MCH: 32.3 pg — ABNORMAL HIGH (ref 28.0–32.0)
MCHC: 33.3 g/dL (ref 32.0–36.0)
MCV: 97 fL (ref 80.0–100.0)
MPV: 11.9 fL (ref 9.4–12.3)
Monocytes Absolute Automated: 0.56 10*3/uL (ref 0.00–1.20)
Monocytes: 6.2 %
Neutrophils Absolute: 7.78 10*3/uL (ref 1.80–8.10)
Neutrophils: 86.1 %
Nucleated RBC: 0 /100 WBC (ref 0.0–1.0)
Platelets: 100 10*3/uL — ABNORMAL LOW (ref 140–400)
RBC: 4.33 10*6/uL — ABNORMAL LOW (ref 4.70–6.00)
RDW: 15 % (ref 12–15)
WBC: 9.04 10*3/uL (ref 3.50–10.80)

## 2016-04-18 LAB — LACTIC ACID, PLASMA
Lactic Acid: 2.9 mmol/L — ABNORMAL HIGH (ref 0.2–2.0)
Lactic Acid: 2.2 mmol/L — ABNORMAL HIGH (ref 0.2–2.0)

## 2016-04-18 LAB — CORTISOL: Cortisol: 16.7 ug/dL

## 2016-04-18 LAB — TROPONIN I
Troponin I: 1.1 ng/mL (ref 0.00–0.09)
Troponin I: 1.04 ng/mL (ref 0.00–0.09)

## 2016-04-18 LAB — MAGNESIUM: Magnesium: 2 mg/dL (ref 1.6–2.6)

## 2016-04-18 LAB — GFR: EGFR: 28.5

## 2016-04-18 MED ORDER — DEXAMETHASONE 4 MG PO TABS
20.0000 mg | ORAL_TABLET | ORAL | Status: DC
Start: 2016-04-18 — End: 2016-05-07

## 2016-04-18 MED ORDER — METOLAZONE 5 MG PO TABS
5.0000 mg | ORAL_TABLET | Freq: Once | ORAL | Status: AC
Start: 2016-04-18 — End: 2016-04-18
  Administered 2016-04-18: 5 mg via ORAL
  Filled 2016-04-18: qty 1

## 2016-04-18 MED ORDER — LENALIDOMIDE 5 MG PO CAPS
5.0000 mg | ORAL_CAPSULE | ORAL | Status: DC
Start: 2016-04-18 — End: 2016-06-05

## 2016-04-18 MED ORDER — BUMETANIDE 1 MG PO TABS
6.0000 mg | ORAL_TABLET | Freq: Two times a day (BID) | ORAL | Status: DC
Start: 2016-04-18 — End: 2016-04-18
  Administered 2016-04-18: 6 mg via ORAL
  Filled 2016-04-18: qty 6

## 2016-04-18 MED ORDER — DEXAMETHASONE 2 MG PO TABS
20.0000 mg | ORAL_TABLET | ORAL | Status: DC
Start: 2016-04-18 — End: 2016-04-18

## 2016-04-18 NOTE — Progress Notes (Signed)
PROGRESS NOTE    Date Time: 04/18/16 8:29 PM  Patient Name: Gregory [name], Gregory [name]                                NEPHROLOGY PROGRESS NOTE    Follow up for acute renal failure  Subjective:   Patient states he is doing well today    Medications:      Scheduled Meds: PRN Meds:        Continuous Infusions:         Review of Systems:    No complaints of chest pain, palpitations shortness of breath, cough, headaches, dizziness, joint pain, abdominal pain, nausea, decreased appetite, or fevers.     Physical Exam:   Patient Vitals for the past 24 hrs:   BP Temp Temp src Pulse Resp SpO2   04/18/16 1400 105/78 - - 93 (!) 33 98 %   04/18/16 1300 90/70 - - 96 (!) 24 100 %   04/18/16 1200 97/70 - - 89 21 99 %   04/18/16 1100 95/70 - - 92 (!) 25 99 %   04/18/16 1000 92/65 - - 85 (!) 23 97 %   04/18/16 0900 95/72 - - 92 14 99 %   04/18/16 0800 98/71 98.2 F (36.8 C) Oral 91 16 100 %   04/18/16 0700 91/70 - - 92 13 97 %   04/18/16 0600 95/51 - - 92 22 -   04/18/16 0500 (!) 87/64 - - 87 17 99 %   04/18/16 0400 95/65 98 F (36.7 C) Oral 89 13 99 %   04/18/16 0300 93/68 - - 95 (!) 11 95 %   04/18/16 0200 91/75 - - 90 19 97 %   04/18/16 0100 97/71 - - 96 17 97 %   04/18/16 0000 93/67 98 F (36.7 C) Oral 94 (!) 10 98 %   04/17/16 2300 96/73 - - 96 18 98 %   04/17/16 2200 96/72 - - 100 (!) 23 99 %   04/17/16 2100 92/68 - - 99 12 99 %       Intake and Output Summary (Last 24 hours) at Date Time    Intake/Output Summary (Last 24 hours) at 04/18/16 2029  Last data filed at 04/18/16 1200   Gross per 24 hour   Intake                0 ml   Output              450 ml   Net             -450 ml       General appearance - alert, well appearing, and in no distress  Mental status - alert, oriented to person, place, and time  Chest - air entry equal on both sides, crackles at the bases  Heart - S1 and S2 normal  Abdomen - soft, no tenderness  Neurological - alert, normal speech  Extremities - trace edema, no clubbing or cyanosis      Labs:     Recent  Labs  Lab 04/18/16  0121 04/17/16  1311   WBC 9.04 6.35   RBC 4.33* 4.69*   Hgb 14.0 15.0   Hematocrit 42.0 46.5   MCV 97.0 99.1   MCHC 33.3 32.3   RDW 15 15   MPV 11.9 11.5   Platelets 100* 100*  Recent Labs  Lab 04/18/16  0121 04/17/16  1311   Sodium 132* 134*   Potassium 4.8 4.7   Chloride 105 101   CO2 15* 18*   BUN 68.0* 70.0*   Creatinine 2.7* 2.8*   Glucose 107* 80   Calcium 8.7 9.1   Magnesium 2.0 1.8         Results     Procedure Component Value Units Date/Time    Blood Culture Aerobic and Anaerobic (age 32 or older) [[id number]] Collected:  04/17/16 1321    Specimen:  Blood from Blood Updated:  04/18/16 1148    Narrative:       [id number]  called Micro Results of pos bldcul. Results read back [id number], by  [id number] on 04/18/2016 at 08:36  ORDER#: [id number]                                    ORDERED BY: Nyoka Lint  SOURCE: Blood [facility] - Arm                        COLLECTED:  04/17/16 13:21  ANTIBIOTICS AT COLL.:                                RECEIVED :  04/17/16 16:38  [id number]  called Micro Results of pos bldcul. Results read back [id number], by [id number] on 04/18/2016 at 08:36  Culture Blood Aerobic and Anaerobic        PRELIM      04/18/16 11:48   +  04/18/16   Anaerobic Blood Culture Positive in less than 24 hrs             Gram Stain Shows: Gram positive cocci in chains             Identification to follow             Aerobic Blood Culture Positive in less than 24 hrs             Gram Stain Shows: Gram positive cocci in chains             Resembling Streptococcus species             Identification to follow      Troponin I [[id number]]  (Abnormal) Collected:  04/18/16 0616    Specimen:  Blood Updated:  04/18/16 0731     Troponin I 1.04 (HH) ng/mL     Lactic acid, plasma [[id number]]  (Abnormal) Collected:  04/18/16 0616    Specimen:  Blood Updated:  04/18/16 0633     Lactic acid 2.2 (H) mmol/L     Cortisol [[id number]] Collected:  04/18/16 0121    Specimen:  Blood Updated:  04/18/16 0434     Cortisol  16.7 ug/dL     Troponin I [[id number]]  (Abnormal) Collected:  04/18/16 0121    Specimen:  Blood Updated:  04/18/16 0241     Troponin I 1.10 (HH) ng/mL     Basic Metabolic Panel [[id number]]  (Abnormal) Collected:  04/18/16 0121    Specimen:  Blood Updated:  04/18/16 0202     Glucose 107 (H) mg/dL      BUN 68.0 (H) mg/dL      Creatinine 2.7 (H) mg/dL      Calcium 8.7 mg/dL      Sodium 132 (L)  mEq/L      Potassium 4.8 mEq/L      Chloride 105 mEq/L      CO2 15 (L) mEq/L     GFR [[id number]] Collected:  04/18/16 0121     Updated:  04/18/16 0202     EGFR 28.5    Magnesium [[id number]] Collected:  04/18/16 0121     Updated:  04/18/16 0202     Magnesium 2.0 mg/dL     CBC with differential [[id number]]  (Abnormal) Collected:  04/18/16 0121    Specimen:  Blood from Blood Updated:  04/18/16 0150     WBC 9.04 x10 3/uL      Hgb 14.0 g/dL      Hematocrit 42.0 %      Platelets 100 (L) x10 3/uL      RBC 4.33 (L) x10 6/uL      MCV 97.0 fL      MCH 32.3 (H) pg      MCHC 33.3 g/dL      RDW 15 %      MPV 11.9 fL      Neutrophils 86.1 %      Lymphocytes Automated 6.9 %      Monocytes 6.2 %      Eosinophils Automated 0.2 %      Basophils Automated 0.2 %      Immature Granulocyte 0.4 %      Nucleated RBC 0.0 /100 WBC      Neutrophils Absolute 7.78 x10 3/uL      Abs Lymph Automated 0.62 x10 3/uL      Abs Mono Automated 0.56 x10 3/uL      Abs Eos Automated 0.02 x10 3/uL      Absolute Baso Automated 0.02 x10 3/uL      Absolute Immature Granulocyte 0.04 x10 3/uL      Absolute NRBC 0.00 x10 3/uL     Lactic acid, plasma [[id number]]  (Abnormal) Collected:  04/18/16 0121    Specimen:  Blood Updated:  04/18/16 0136     Lactic acid 2.9 (H) mmol/L     MRSA culture [[id number]] Collected:  04/17/16 1853    Specimen:  Body Fluid from Nares and Throat Updated:  04/17/16 2216    Troponin I [[id number]]  (Abnormal) Collected:  04/17/16 1853    Specimen:  Blood Updated:  04/17/16 2023     Troponin I 1.07 (HH) ng/mL     Lactic acid, plasma - Sepsis specimen #2,  follow-up to positive initial lactate value. [[id number]]  (Abnormal) Collected:  04/17/16 1853    Specimen:  Blood Updated:  04/17/16 1859     Lactic acid 3.6 (H) mmol/L     D-Dimer [[id number]]  (Abnormal) Collected:  04/17/16 1311     Updated:  04/17/16 1717     D-Dimer 2.99 (H) ug/mL FEU             Rads:   Radiological Procedure reviewed.    Signed by: [name], MD    Assessment:   1) Renal: AKI, related to cardio-renal or amyloidosis. Creatinine improved after diuresies.     2) Hypotension: Will continue to monitor    3) Hyponatremia: Related to fluid overload. Continue diuresis  :    Plan:   Continue diuresis  Monitor blood pressure  Monitor weights      [name], MD  [facility]  Spectra# [phone]  Pg# [phone]  Office # [phone]  After 430 PM, please call the office for person on  call  On Weekends, call the office for the person on call

## 2016-04-18 NOTE — Progress Notes (Signed)
SW spoke to patient who stated his wife has already scheduled an appointment with his PCP in [place], [place]. He stated he thought it was for tomorrow. He suggested SW call wife. SW left VM for wife @ [phone] (home) and [phone].  Doroteo Glassman, LSW  Case Management  Spectra [phone]

## 2016-04-18 NOTE — Discharge Instr - AVS First Page (Addendum)
Reason for your Hospital Admission:  You were initially admitted for concern for a heart failure exacerbation. We think that you are stable to go home now.    Instructions for after your discharge:  Please continue to take Bumex 6 mg, twice a day. You will follow up with Dr. Tyrell Antonio on Wednesday, April 24, 2016 at 12:00PM .    Please continue taking your amyloidosis medications (Revlimid and Dexamethasone) by the previous instructions of you oncologist (Dr. Yong Channel).        Date Time: 04/18/16 2:17 PM  Attending Physician: Nada Libman, *    Date of Admission:   04/17/2016    Reason for Admission:   Dehydration [E86.0]  Neck pain [M54.2]  Elevated troponin [R74.8]  Bilateral lower extremity edema [R60.0]  Hypotension, unspecified hypotension type [I95.9]  Nonintractable headache, unspecified chronicity pattern, unspecified headache type [R51]  Acute on chronic congestive heart failure, unspecified congestive heart failure type [I50.9]  Elevated troponin [R74.8]  Abnormal levels of other serum enzymes [R74.8]  Abnormal levels of other serum enzymes [R74.8]  Abnormal levels of other serum enzymes [R74.8]    Follow up:        Neurology (If Stroke): ______________________  Phone:______________________    Other:___________________________________   Phone: ______________________       Medications:  . Your medications have been listed for you on the Medication Reconciliation Discharge Home List. Please bring a copy of all discharge instructions, including your Medication Reconciliation Discharge Home List when you visit your physician.    . Continue taking all medications even if you feel well, unless otherwise instructed by physician.  . Do not take any over-the-counter medications or herbal supplements without checking with your pharmacist or doctor.       If you are taking Warfarin, please follow up with (health professional/clinic) ____________ on _____________to have your PT/INR blood test checked.      Activity:  . Rise slowly from a sitting or lying position. Increase activity slowly, unless otherwise instructed by physician.  [name] Perform exercises as desginated by Therapist and Physician.  . In the event of severe shortness of breath or chest discomfort, call 911. Do NOT drive to the hospital.  . Speak with your physician regarding specific driving and/or work restrictions.    Diet:  . If you have special diet orders, you have been given printed diet instructions.   ________________________________________________________________________    Tobacco Cessation Counseling:  If you are currently a tobacco user or have used tobacco within the last 12 months, we have provided you with written Tobacco Cessation Counseling.  ________________________________________________________________________    Heart Failure Education:  If you have a diagnosis of a Heart Failure, we have provided you with written Heart Failure Education.   . Weigh yourself once a day at the same time. Record and bring the weight record to your next physician appointment.   . Call your doctor if you gain more than 3 pounds in one day or 5 pounds in one week, or if you experience shortness of breath, leg swelling and/or chest discomfort.   . Enroll in the [facility] [employer]-Assurance Program, a heart failure patient support program. Call [phone] for additional details.   ________________________________________________________________________    Nash Dimmer Education:  . Call 911 for:  [name] Sudden numbness or weakness of the face  . Sudden numbness of the arm or leg especially one side of the body  . Sudden confusion, trouble speaking or understanding  . Sudden trouble seeing in  one or both eyes  . Sudden trouble walking or dizziness, loss of balance or coordination      . For promotion of your health, we have provided you with personalized written education on risk factors specific to your diagnosis, including but not limited to:  [name] High Blood  Pressure  . High Cholesterol  . Atrial Fibrillation  . Overweight  . Diabetes  . Smoking         Vaccinations  Pneumonia Vaccine Received on:  Flu Vaccine Received on:             Treatments/Special Instructions:       Reason for your Hospital Admission:  You were initially admitted for concern for a heart failure exacerbation. We think that you are stable to go home now.    Instructions for after your discharge:  Please continue to take Bumex 6 mg, twice a day. You will follow up with Dr. Tyrell Antonio on Wednesday, April 24, 2016 at 12:00PM .    Please continue taking your amyloidosis medications (Dexamethasone) by the previous instructions of you oncologist (Dr. Yong Channel)         Heart Failure Discharge Instructions      Follow Up:   Most likely there are specific follow up instructions listed in another section of these discharge instructions. If we have not already scheduled a follow-up appointment with your physician (Cardiologist or (PCP) Primary Care Physician),  a good rule of thumb is to call your PCP as soon as you are home to make an appointment to be seen within 3 - 5 days of discharge.  If you do not have a PCP, please call [phone] for assistance 24 hours a day, 7 days a week.    Activity:   Unless otherwise instructed, gradually increase your activity as tolerated.  Let your symptoms guide you.  If your doctor has not discussed about enrolling you in cardiac rehab, bring it up at your next appointment     Diet:    Follow a heart healthy diet low in saturated fat and cholesterol.   Limit salt (sodium) in your diet to less than 2000 mg daily (1 teaspoon salt = 2400 mg sodium).    Consult with your physician before using a salt substitute, which may be high in potassium.    Fluid restriction:  Limit your daily fluid intake to 1.5 liters or 48 ounces.    Risk Factors:    All patients are advised to not smoke or use any tobacco products.    If you use tobacco products and want help in  quitting, call the QuitLine:1-800-QUIT-NOW 309-435-3918).   Know the symptoms of heart failure use the Heart Failure Zone information below to manage symptoms.    Weight Monitoring:     Rapid changes in weigh may reflect retention of fluid, which will worsen your heart failure symptoms.    Weigh yourself each morning before breakfast.     To ensure consistent results, place your scale on a section of bare floor without rug or carpeting.     Record date, time, and weight in your notebook and bring it with you to appointments to review with your doctor.     Review the Heart Failure Zone instructions for when to call your doctor.                              Managing your Heart Failure Using the "  Zones"      Green Zone - All Clear  Action     . No new or worsening shortness of breath  . No new or worsening swelling of your hands, abdomen, legs or ankles  . No Weight gain  . No chest pain or tightness  . No decrease in your ability to maintain your activity level      Continue taking your medications as ordered   Continue your daily weights   Follow a low salt diet   Keep all physician appointments and follow-ups     Yellow Zone - Caution Action      Weight gain of 2 pounds in a day   Increased swelling of your hands, abdomen, legs, or ankles   Increased in shortness of breath with activity   Increase in the number of pillows needed to sleep at night   New or more frequent chest pain or tightness   New onset of dizziness or lightheadedness after standing up     . Call your physician's office      Do not wait until you are RED to call.    Call for even one symptom!     Red Zone - "Emergency" Action      Unrelieved shortness of breath or shortness of breath at rest   Unrelieved chest pain   Wheezing or chest tightness at rest   Need to sit in a chair to sleep   Weight gain of more than 3 pounds in a day or 5 pounds in a week   A fall related to dizziness or lightheadedness      CALL your  physician's office right away!      Call 911 if you:   Faint or pass out   Become extremely short of breath or are unable to talk due to breathlessness   Have severe chest pain     Learn more at [email or web address]                                  Heart Failure Education Checklist     I understand that my heart has structural and/or functional problems which cause the symptoms of heart failure.     I know what my Ejection Fraction (EF), a measure of heart function, is:________%     I know what medications I take for heart failure and understand how to take them     I understand the importance of taking my medications, exactly as they are prescribed so that I can feel better, live longer, and stay out of the hospital     I understand the importance of weighing myself daily and that I should contact my physician if I have unexpected weight gain (2 pounds in one day)     I know what symptoms to look for to make sure my heart failure is not getting worse such as swelling of legs or ankles, more trouble breathing, dizziness, or feeling tired     I understand that physical activity is good for me and I have been given instructions on what type of exercises to do     I understand that I should maintain a low salt diet, monitor my fluid intake, and follow dietary instructions provided by my healthcare provider    I understand the heart failure zones and know what to do if my symptoms are in the yellow or red zone  Discharge Instructions: Taking Your Blood Pressure  Blood pressure is the force of blood as it moves from the heart through the blood vessels. You can take your own blood pressure reading using a digital monitor. Take readings as often as yourhealthcare providerinstructs. Take your readings each timein the same way, using the same arm. Here are guidelines for taking your blood pressure.  The American Heart Association (AHA) recommends purchasing a blood pressure monitor that is validated and  approved by the Association for the Advancement of Medical Instrumentation, the [place] Hypertension Society, and the International Protocol for the Validation of Automated BP Measuring Devices. If the blood pressure monitor is for a senior adult, a pregnant woman, or a child, make certain it is validated for use with such a population. For the most reliable readings, the AHA recommends an automatic, cuff-style, upper arm (bicep) monitor. The readings from finger and wrist monitors are not as reliable as the upper arm monitor.      Step 1. Relax     Wait at least a half hour after smoking,eating,or exercising. Do not drink coffee, tea, soda, or other caffeinated beverages before checking your blood pressure.   Sit comfortably at a table. Place the monitor near you.   Rest for a few minutes before you begin.      Step 2. Wrap the cuff     Place your arm on the table,palm up. Put your arm in a positionthat islevelwith your heart. Wrap the cuff around your upper arm, about an inch above your elbow. It's best to wrap the cuff on bare skin,not over clothing.   Make sure your cuff fits. If it doesn'twrap around your upper arm,order a larger cuff. A cuff that is too large or too small can result in an inaccurate blood pressure reading.        Step 3. Inflate the cuff     Pump the cuff until the scale reads 200. If you have a self-inflating cuff,push the button that starts the pump.   The cuff will tighten,then loosen.   The numbers will change. When they stop changing, your blood pressure reading will appear.   If you get a reading that is too high or too low for you, relax for a few minutes. Then do the test again.    Step 4. Write down the results   Write down your blood pressure numbers. Elta [place] the date and time. Keep your results in one place,such as a notebook.   Remove the cuff from your arm. Turn off the machine.   Take the readings with you to your medical appointments.   If you start  a new blood pressure medicine, or change a blood pressure medicine dose, note the day you started the new drug or dosage on your blood pressure recording sheet. This will help your healthcare provider monitor the effect of medication changes.    Date Last Reviewed: 10/12/2014   2000-2016 [employer], [place] [address]. All rights reserved. This information is not intended as a substitute for professional medical care. Always follow your healthcare professional's instructions.        Bumetanide Oral tablet  What is this medicine?  BUMETANIDE (byoo MET a nide) is a diuretic. It helps you make more urine and to lose salt and excess water from your body. This medicine is used to treat high blood pressure, and edema or swelling from heart, kidney, or liver disease.  This medicine may  be used for other purposes; ask your health care provider or pharmacist if you have questions.  What should I tell my health care provider before I take this medicine?  They need to know if you have any of these conditions:   abnormal blood electrolytes   diarrhea or vomiting   gout   heart disease   kidney disease, small amounts of urine, or difficulty passing urine   liver disease   an unusual or allergic reaction to bumetanide, sulfa drugs, other medicines, foods, dyes, or preservatives   pregnant or trying to get pregnant   breast-feeding  How should I use this medicine?  Take this medicine by mouth with a glass of water. Follow the directions on the prescription label. You may take this medicine with or without food but if it upsets your stomach, take it with food or milk. Do not take it more often than directed. Remember that you will need to pass more urine after taking this medicine. Do not take it at a time of day that will cause you problems. Do not take at bedtime.  Talk to your pediatrician regarding the use of this medicine in children. Special care may be needed.  Overdosage: If  you think you have taken too much of this medicine contact a poison control center or emergency room at once.  NOTE: This medicine is only for you. Do not share this medicine with others.  What if I miss a dose?  If you miss a dose, take it as soon as you can. If it is almost time for your next dose, take only that dose. Do not take double or extra doses.  What may interact with this medicine?   alcohol   certain antibiotics given by injection   diuretics   heart medicines like digoxin and dofetilide   hormones like cortisone, fludrocortisone, hydrocortisone   lithium   medicines for diabetes   medicines for high blood pressure   medicines for inflammation like indomethacin   OTC supplements like ginseng and ephedra  This list may not describe all possible interactions. Give your health care provider a list of all the medicines, herbs, non-prescription drugs, or dietary supplements you use. Also tell them if you smoke, drink alcohol, or use illegal drugs. Some items may interact with your medicine.  What should I watch for while using this medicine?  Visit your doctor or health care professional for regular check ups. Check your blood pressure regularly. Ask your doctor or health care professional what your blood pressure should be, and when you should contact him or her. If you are a diabetic, check your blood sugar as directed.  You may need to be on a special diet while taking this medicine. Ask your doctor. Also, ask how many glasses of fluid you need to drink each day. You must not get dehydrated.  You may get drowsy or dizzy. Do not drive, use machinery, or do anything that needs mental alertness until you know how this drug affects you. Do not stand or sit up quickly, especially if you are an older patient. This reduces the risk of dizzy or fainting spells. Alcohol can make you more drowsy and dizzy. Avoid alcoholic drinks.  What side effects may I notice from receiving this medicine?  Side effects  that you should report to your doctor or health care professional as soon as possible:   allergic reactions like skin rash, itching or hives, swelling of the face, lips, or tongue  blood in the urine or stools   blurred vision   dry mouth   fever or chills   hearing loss or ringing in the ears   irregular heartbeat   muscle cramps, pain or weakness   unusually weak or tired   vomiting or diarrhea  Side effects that usually do not require medical attention (report to your doctor or health care professional if they continue or are bothersome):   headache   loss of appetite   unusual bleeding or bruising  This list may not describe all possible side effects. Call your doctor for medical advice about side effects. You may report side effects to FDA at 1-800-FDA-1088.  Where should I keep my medicine?  Keep out of the reach of children.  Store at room temperature between 15 and 30 degrees C (59 and 86 degrees F). Throw away any unused medicine after the expiration date.  NOTE:This sheet is a summary. It may not cover all possible information. If you have questions about this medicine, talk to your doctor, pharmacist, or health care provider. Copyright 2015 Gold Standard           Metolazone Oral tablet  What is this medicine?  METOLAZONE (me TOLE a zone) is a diuretic. It increases the amount of urine passed, which causes the body to lose salt and water. This medicine is used to treat high blood pressure. It is also reduces the swelling and water retention caused by heart or kidney disease.  This medicine may be used for other purposes; ask your health care provider or pharmacist if you have questions.  What should I tell my health care provider before I take this medicine?  They need to know if you have any of these conditions:   diabetes   gout   immune system problems, like lupus   kidney disease   liver disease   pancreatitis   small amount of urine or difficulty passing urine   an unusual or  allergic reaction to metolazone, sulfa drugs, other medicines, foods, dyes, or preservatives   pregnant or trying to get pregnant   breast-feeding  How should I use this medicine?  Take this medicine by mouth with a glass of water. Follow the directions on the prescription label. Remember that you will need to pass urine frequently after taking this medicine. Do not take your doses at a time of day that will cause you problems. Do not take at bedtime. Take your medicine at regular intervals. Do not take your medicine more often than directed. Do not stop taking except on your doctor's advice.  Talk to your pediatrician regarding the use of this medicine in children. Special care may be needed.  Overdosage: If you think you have taken too much of this medicine contact a poison control center or emergency room at once.  NOTE: This medicine is only for you. Do not share this medicine with others.  What if I miss a dose?  If you miss a dose, take it as soon as you can. If it is almost time for your next dose, take only that dose. Do not take double or extra doses.  What may interact with this medicine?   alcohol   antiinflammatory drugs for pain or swelling   barbiturates for sleep or seizure control   digoxin   dofetilide   lithium   medicines for blood sugar   medicines for high blood pressure   medicines that relax muscles for surgery  methenamine   other diuretics   some medicines for pain   steroid hormones like cortisone, hydrocortisone, and prednisone   warfarin  This list may not describe all possible interactions. Give your health care provider a list of all the medicines, herbs, non-prescription drugs, or dietary supplements you use. Also tell them if you smoke, drink alcohol, or use illegal drugs. Some items may interact with your medicine.  What should I watch for while using this medicine?  Visit your doctor or health care professional for regular checks on your progress. Check your blood  pressure as directed. Ask your doctor or health care professional what your blood pressure should be and when you should contact him or her.  You may need to be on a special diet while taking this medicine. Ask your doctor.  Check with your doctor or health care professional if you get an attack of severe diarrhea, nausea and vomiting, or if you sweat a lot. The loss of too much body fluid can make it dangerous for you to take this medicine.  You may get drowsy or dizzy. Do not drive, use machinery, or do anything that needs mental alertness until you know how this medicine affects you. Do not stand or sit up quickly, especially if you are an older patient. This reduces the risk of dizzy or fainting spells. Alcohol may interfere with the effect of this medicine. Avoid alcoholic drinks.  This medicine may affect your blood sugar level. If you have diabetes, check with your doctor or health care professional before changing the dose of your diabetic medicine.  This medicine can make you more sensitive to the sun. Keep out of the sun. If you cannot avoid being in the sun, wear protective clothing and use sunscreen. Do not use sun lamps or tanning beds/booths.  What side effects may I notice from receiving this medicine?  Side effects that you should report to your doctor or health care professional as soon as possible:   allergic reactions such as skin rash or itching, hives, swelling of the lips, mouth, tongue, or throat   fast or irregular heartbeat, chest pain   feeling faint   fever, chills   gout pain   hot red lump on leg   muscle pain, cramps   nausea, vomiting   numbness or tingling in hands, feet   pain or difficulty when passing urine   redness, blistering, peeling or loosening of the skin, including inside the mouth   unusual bleeding or bruising   unusually weak or tired   yellowing of the eyes, skin  Side effects that usually do not require medical attention (report to your doctor or health  care professional if they continue or are bothersome):   abdominal pain   blurred vision   constipation or diarrhea   dry mouth   headache  This list may not describe all possible side effects. Call your doctor for medical advice about side effects. You may report side effects to FDA at 1-800-FDA-1088.  Where should I keep my medicine?  Keep out of the reach of children.  Store at room temperature between 15 and 30 degrees C (59 and 86 degrees F). Protect from light. Keep container tightly closed. Throw away any unused medicine after the expiration date.  NOTE:This sheet is a summary. It may not cover all possible information. If you have questions about this medicine, talk to your doctor, pharmacist, or health care provider. Copyright 2015 Gold Standard  Signed by: Loma Sender, RN    I HAVE RECEIVED AND UNDERSTAND THESE DISCHARGE INSTRUCTIONS.

## 2016-04-18 NOTE — Progress Notes (Signed)
Pt discharged to home with all belongings. Discharge education and f/u discussed with pt- pt verbalized understanding.  Core measure: Heart Failure. Pt not on ace-inhibitor- per MD, holding due to soft pressures- documented in notes.

## 2016-04-18 NOTE — Progress Notes (Signed)
Per chart review the patient's cardiologist is Dr. Tyrell Antonio, MD. SW contacted office @ [phone] ext [phone]. Per scheduler the patient's next appointment is 11/08 @ noon. Patient has declined [facility].   Case has been escalated to Manager. Manager aware.     Doroteo Glassman, LSW  Case Management

## 2016-04-18 NOTE — Progress Notes (Signed)
ICU/CCU Daily Progress Note    Patient's Name: Gregory [name]    Room:  FI100/FI100-01  Attending Provider: Nada Libman, *  Admit Date:04/17/2016  Medical Record Number: [id number]     Date/Time: 04/18/16 6:33 AM    70 y.o. male with PMH of amyloidosis complicated by CHF (83-46%), CKD Stage 4, asthma, HTN who presents with headache, LE edema, and hypotension, thought to be in acute decompensated heart failure. Pt initally admitted to CCU for concern for cardiogenic shock.      24hr events:  No acute events, had a BM and feels better. No HA, CP, SOB. Stable leg edema.    Patient Active Problem List   Diagnosis   . Ganglion cyst of wrist, right   . Primary localized osteoarthrosis, shoulder region   . Asthma without status asthmaticus   . Acute on chronic congestive heart failure, unspecified congestive heart failure type   . Elevated troponin   . Bilateral lower extremity edema          VITAL SIGNS PHYSICAL EXAM   Temp:  [97.4 F (36.3 C)-98.6 F (37 C)] 98 F (36.7 C)  Heart Rate:  [87-113] 87  Resp Rate:  [7-32] 7  BP: (85-141)/(56-75) 87/64  Blood Glucose:  Pulse ox:  Telemetry:       Intake/Output Summary (Last 24 hours) at 04/18/16 2194  Last data filed at 04/18/16 0300   Gross per 24 hour   Intake              200 ml   Output              150 ml   Net               50 ml    Physical Exam  Neuro:  A&Ox3, no focal deficits  HEENT: PERRL, macroglossia with scalloping  Cardiac: RRR, S3 and minimal TR murmur  Lungs: CTAB no w/r/r  Abdomen: +BS, soft, NTND   Ext: 1+ pitting edema to the knee  Skin: no rashes, no jaundice        Scheduled Meds: PRN Meds:      aspirin EC 81 mg Oral Daily   fluticasone 2 spray Each Nare Daily   heparin (porcine) 5,000 Units Subcutaneous Q8H [place]   influenza 0.5 mL Intramuscular Once   NON-FORMULARY PAT OWN MED order form 5 mg Oral Every Other Day   polyethylene glycol 17 g Oral Daily       Continuous Infusions:      acetaminophen 325 mg Q4H PRN   naloxone 0.2 mg PRN            Labs (last 72 hours):  Recent Labs      04/18/16   0121  04/17/16   1311   WBC  9.04  6.35   Hgb  14.0  15.0   Hematocrit  42.0  46.5     Recent Labs      04/17/16   1311   PT  16.0*   PT INR  1.3*    Recent Labs      04/18/16   0121  04/17/16   1311   Sodium  132*  134*   Potassium  4.8  4.7   Chloride  105  101   CO2  15*  18*   BUN  68.0*  70.0*   Creatinine  2.7*  2.8*   Glucose  107*  80   Calcium  8.7  9.1  Magnesium  2.0  1.8               ABG: none  BNP 4082  AM cortisol 16.7  Lactate: 3.6, 2.2  Trop 0.4, 1.07, 1.10, 1.04    Microbiology:   MRSA pending    Imaging:  CXR without acute cardiopulmonary process    Assessment and Plan:  [name] is a 70 y.o. male with PMH of amyloidosis complicated by CHF (97-98%), CKD Stage 4, asthma, HTN who presents with acute decompensated systolic heart failure. Pt current HDS, with low concern for cardiogenic shock.     Neuro:   #HA  -CT without any acute process  -possibly 2/2 Revlimid  -PRN tylenol    Cardio:   HFrEF 2/2 amyloid  -trops still slowly trending up, currently 1.10, continue to trend to peak q6  -BNP 4082  -LA downtrending from 3.6 to 2.2  -adding back bumex and metolazone at home doses    #Hypotension  -HFrEf exacerbation  -AM cortisol appropriate, no concern for AI  -not currently requiring pressors  -s/p PICC placement    Resp: NAI    Renal /Fluid, Electrolytes:   #CKD, baseline 2.9  -creatinine 2.8  -f/u nephro recs    GI: NAI    Nutrition: Cardiac diet    Infectious Disease (ID):   #elevated lactate  -downtrending after 1.5L fluids  -f/u bcx           Hem/Onc:   #Amyloid  -continue home med Revlamid 37m PO qOD  -continue Decadron 456mPO qweek, last dose 10/31    Endo: NAI    Prophylaxis:  - DVT:  subq hep  - GI:  none    Code Status: Full code    Dispo: Discharge if BPs tolerate addition of Bumex and metolazone    Lines/Drains/Airways:      Safety Checklist:     DVT prophylaxis:  CHEST guideline (See page [id number]) Chemical and Mechanical    Foley: Not present   IVs:  Peripheral IV and Central IV: Present and will d/c   PT/OT: Not needed   Daily CBC & or Chem ordered:  SHM/ABIM guidelines (see #5) Will d/c as no longer needed   Reference for charges of common labs: CBC auto diff - $76  BMP - $99  Mg - $79        Signed by: BrDorothy PufferMD  Date/Time: 04/18/16 6:33 AM

## 2016-04-18 NOTE — Consults (Signed)
CCU Attending Consultation Note     Referring Physician:  Gareth Eagle MD    Reason for Consultation: CHF    Assessment and Plan:   70 y.o. male with PMH of amyloidosis with heart involvement, complicated by CHF (46-79%), CKD Stage 4, asthma, HTN, recent admission in [facility] for CHF exacerbation, s/p IV diuresis,  who presents with headache, LE edema, and mild hypotension, admitted to CCU with a concern of cardiogenic shock.   In ED, received 1.5 L fluid, now slightly volume overloaded. Trop mildly elevated at 0.46 , likely due to chronic CHF and CKD.     Plan:  - Pt is not in cardiogenic shock; in fact, volume status is good, only mildly volume overloaded. Would resume home dose of Bumex and Metolazone , and give onde dose of  Metolazone  today.   - 2D echo with full Doppler study to eval current LVSF.   - BP on the lower side but stable, asymptomatic. Will continue hold BP meds.   - May be discharged home today with Cardiology follow up in one week.     I have spent 60  minutes providing critical care for the patient,  excluding teaching time , not overlapping with other providers.            Chief Complaint: Headache     History of Present Illness:   70 y.o. male with PMH of amyloidosis with heart involvement, complicated by CHF (38-53%), CKD Stage 4, asthma, HTN, recent admission in [facility] for CHF exacerbation, s/p IV diuresis,  who presents with headache, LE edema, and mild ypotensionh, thought to be in acute decompensated heart failure. Pt initally admitted to CCU for concern for cardiogenic shock.     Overnight, the pt's condition was stable, BP on the lower side but stable. Pt reports chronic DOE, with 3 -pillow orthopnea, and mild LE swelling. He has been taking Bumex 6 mg po bid and Metolazone PRN since being discharged from [facility] on 10/17. The HA resolved without intervention. He also reports feeling nauseous on admission, now resolved.  No CP or palpitations.     Patient Active Problem List    Diagnosis Date Noted    . Bilateral lower extremity edema    . Elevated troponin 04/21/2014   . Acute on chronic congestive heart failure, unspecified congestive heart failure type 03/18/2014   . Asthma without status asthmaticus    . Primary localized osteoarthrosis, shoulder region 06/18/2013   . Ganglion cyst of wrist, right 12/21/2012     Past Medical History:   Diagnosis Date   . Amyloidosis    . Arthritis     bilat shoulders, lt knee   . Asthma without status asthmaticus     childhood   . CHF (congestive heart failure)    . Fracture of unspecified bones     1974 lt shoulder   . Malignant neoplasm       Past Surgical History:   Procedure Laterality Date   . ARTHROPLASTY, SHOULDER, TOTAL  06/18/2013    Procedure: ARTHROPLASTY, SHOULDER, TOTAL;  Surgeon: Wendi Snipes, MD;  Location: [facility];  Service: Orthopedics;  Laterality: Left;   . COLONOSCOPY     . EXCISION, GANGLION  12/19/2012    Procedure: EXCISION, GANGLION;  Surgeon: Jillyn Hidden, MD;  Location: [facility];  Service: Orthopedics;  Laterality: Right;  injection right and left shoulder   . HERNIA REPAIR      lt inguinal age  11   . INJECTION, MEDICATION  12/19/2012    Procedure: INJECTION, MEDICATION;  Surgeon: Jillyn Hidden, MD;  Location: [facility];  Service: Orthopedics;  Laterality: Bilateral;      Prescriptions Prior to Admission   Medication Sig Dispense Refill Last Dose   . aspirin EC 81 MG EC tablet Take 81 mg by mouth daily.   04/17/2016 at Unknown time   . bumetanide (BUMEX) 2 MG tablet Take 6 mg by mouth 2 (two) times daily.   04/17/2016 at Unknown time   . fluticasone (FLONASE) 50 MCG/ACT nasal spray 2 sprays by Nasal route daily.   04/16/2016 at Unknown time   . metOLazone (ZAROXOLYN) 5 MG tablet Take 5 mg by mouth as needed (PRN Sun, Tues Thus if weight >135).   Past Week at Unknown time   . potassium chloride (K-TAB,KLOR-CON) 10 MEQ tablet Take 10 mEq by mouth 2 (two) times daily.   04/16/2016 at Unknown time   . [DISCONTINUED] Lenalidomide  (REVLIMID) 5 MG Cap Take 5 mg by mouth.Days 1-21. Hold for 7 days. For Amyloidosis   04/16/2016 at Unknown time   . prochlorperazine (COMPAZINE) 10 MG tablet Take 10 mg by mouth every 6 (six) hours as needed (Nausea).   Unknown at Unknown time   . [DISCONTINUED] dexamethasone (DECADRON) 2 MG tablet Take 10 mg by mouth See Admin Instructions.Given on days 1-21 of chemo cycle   Unknown at Unknown time   . [DISCONTINUED] torsemide (DEMADEX) 20 MG tablet Take 40 mg by mouth daily.        No Known Allergies   Current Facility-Administered Medications   Medication Dose Route Frequency Provider Last Rate Last Dose   . acetaminophen (TYLENOL) tablet 325 mg  325 mg Oral Q4H PRN Marilynne Halsted, MD       . aspirin EC tablet 81 mg  81 mg Oral Daily Monico Hoar, MD   81 mg at 04/18/16 0918   . bumetanide (BUMEX) tablet 6 mg  6 mg Oral BID Dorothy Puffer, MD   6 mg at 04/18/16 1026   . fluticasone (FLONASE) 50 MCG/ACT nasal spray 2 spray  2 spray Each Nare Daily Monico Hoar, MD   2 spray at 04/18/16 0918   . heparin (porcine) injection 5,000 Units  5,000 Units Subcutaneous Q8H [facility] Marilynne Halsted, MD   5,000 Units at 04/18/16 [phone]   . influenza trivalent vaccine (FLUZONE HIGH-DOSE) IM injection (age 23 & greater) 0.5 mL  0.5 mL Intramuscular Once Terek, Leonia Reeves, MD       . naloxone [facility]) injection 0.2 mg  0.2 mg Intravenous PRN Marilynne Halsted, MD       . PATIENT SUPPLIED NON FORMULARY 5 mg  5 mg Oral Every Other Day Marilynne Halsted, MD       . polyethylene glycol (MIRALAX) packet 17 g  17 g Oral Daily Nada Libman, MD   17 g at 04/17/16 2237     Social History   Substance Use Topics   . Smoking status: Never Smoker   . Smokeless tobacco: Never Used   . Alcohol use Yes      Comment: rare        Family History:   Family History   Problem Relation Age of Onset   . Family history unknown: Yes        Review of Systems    Constitutional:  No weight loss, fever, or night sweats  HEENT:  No vertigo,  visual  loss, or diplopia  Cardiovascular:  See HPI  Respiratory:  No cough, sputum, asthma, or wheezing  Gastrointestinal:  No nausea, heartburn, abdominal pain, constipation or diarrhea  Genitourinary:  No dysuria, frequency, or urgency  Musculoskeletal:  No back pain, muscular weakness or joint swelling  Endocrine:   No cold intolerance, heat intolerance, or polyuria  Neurological:  No syncope, seizures, numbness or tingling of hands, numbness or tingling of feet, unsteady gait or speech difficulties  Skin: No edema, color change or chronic ulcers  Heme: No bruising, no petechiae, no bleeding gums  Psych: Normal affect, normal mood, no suicidal ideation    Objective:   BP 90/70   Pulse 96   Temp 98.2 F (36.8 C) (Oral)   Resp (!) 24   Ht 1.676 m (_0 )   Wt 63.5 kg (139 lb 15.9 oz)   SpO2 100%   BMI 22.60 kg/m     General Appearance:    Alert, cooperative, no distress, appears stated age   Head:    Normocephalic, atraumatic   Eyes:    PERRL, conjunctiva/corneas clear, sclera anicteric   Nose:   Nares normal, septum midline, mucosa normal, no drainage    or sinus tenderness   Throat:   Lips, mucosa, and tongue normal; Oropharynx clear, no exudate   Neck:   Supple, symmetrical, trachea midline, no adenopathy;        thyroid:  No enlargement/tenderness/nodules;  Carotids 2+ Bilaterally, no JVD   Lungs:     Clear to auscultation bilaterally, respirations unlabored   Chest wall:    No tenderness or deformity   Heart:    Regular rate and rhythm, S1 and S2 normal, no murmur, no rub or gallop, PMI inferolaterally displaced.    Abdomen:     Soft, non-tender, bowel sounds active all four quadrants,     no masses, no organomegaly   Extremities:   Extremities warm, atraumatic, no clubbing, cyanosis; 1+ pitting edema b/l in the LEs up to mid shin, and dependent area in the back of the knees.    Pulses:   2+  and symmetric all extremities, including radial and DP   Skin:   Skin color, texture, turgor normal, no rashes or  lesions   Neurologic:   Moving all 4 extremities, exam grossly non focal     Cardiac Data Review  Echo Results     Pending.         EKG Results     Procedure Component Value Units Date/Time    ECG 12 lead [[id number]] Collected:  04/17/16 1301     Updated:  04/18/16 0017     Ventricular Rate 119 BPM      Atrial Rate 119 BPM      P-R Interval 138 ms      QRS Duration 82 ms      Q-T Interval 286 ms      QTC Calculation (Bezet) 402 ms      P Axis 50 degrees      R Axis 213 degrees      T Axis 69 degrees     Addenda:        SINUS TACHYCARDIA  LOW VOLTAGE QRS  NONSPECIFIC ST AND T WAVE ABNORMALITY  PROBABLE SEPTAL INFARCTION, , AGE UNDETERMINED  POSSIBLE LATERAL MYOCARDIAL INFARCTION , AGE UNDETERMINED  PROBABLE INFERIOR INFARCTION  (CITED ON OR BEFORE 26-Aug-2014)  ABNORMAL ECG  WHEN COMPARED WITH ECG OF 26-Aug-2014 09:07,  NO MAJOR CHANGES  Reconfirmed by [facility] MD, [place] [phone]) on 04/18/2016 12:17:07 AM  Signed:  04/18/16 0017 by Sue Lush, MD    Narrative:       SINUS TACHYCARDIA  LOW VOLTAGE QRS  PROBABLE SEPTAL INFARCTION, , AGE UNDETERMINED  POSSIBLE LATERAL MYOCARDIAL INFARCTION , AGE UNDETERMINED  PROBABLE INFERIOR INFARCTION  (CITED ON OR BEFORE 26-Aug-2014)  ABNORMAL ECG  WHEN COMPARED WITH ECG OF 26-Aug-2014 09:07,  NO MAJOR CHANGES  Confirmed by Rusty Aus MD, [place] ([id number]) on 04/18/2016 12:16:35 AM    EKG SCAN [[id number]] Resulted:  04/18/16 0017     Updated:  04/18/16 0017          Labs:   Lab Results   Component Value Date    WBC 9.04 04/18/2016    HGB 14.0 04/18/2016    HCT 42.0 04/18/2016    MCV 97.0 04/18/2016    PLT 100 (L) 04/18/2016     No results found for: CKTOTAL, CKMB, CKMBINDEX  No results found for: CKTOTAL  Lab Results   Component Value Date    BUN 68.0 (H) 04/18/2016    NA 132 (L) 04/18/2016    K 4.8 04/18/2016    CL 105 04/18/2016    CO2 15 (L) 04/18/2016   Cr 2.8    Chest X-Ray:  Radiology Results (24 Hour)     ** No results found for the last 24 hours. **

## 2016-04-18 NOTE — Discharge Summary (Signed)
MEDICINE DISCHARGE SUMMARY    Date Time: 04/18/16 2:40 PM  Patient Name: Gregory [name] T  Attending Physician: Nada Libman, *  Primary Care Physician: [name], MD    Date of Admission: 04/17/2016  Date of Discharge: 04/18/16    Discharge Diagnoses:     Principal Diagnosis (Diagnosis after study, that is chiefly responsible for admission to inpatient status): Acute on chronic congestive heart failure, unspecified congestive heart failure type  Active Hospital Problems    Diagnosis POA   . Principal Problem: Acute on chronic congestive heart failure, unspecified congestive heart failure type Yes   . Bilateral lower extremity edema Yes   . Elevated troponin Yes      Resolved Hospital Problems    Diagnosis POA   No resolved problems to display.     Disposition:      Home with family    Pending Results, Recommendations & Instructions to providers after discharge:     1. Micro / Labs / Path pending:   Unresulted Labs     Procedure . . . Date/Time    MRSA culture [[id number]] Collected:  04/17/16 1853    Specimen:  Body Fluid from Nares and Throat Updated:  04/17/16 2216        2. Wound Care Instructions: none    3. Date of completion for antibiotics or other medications: none    Recent Labs - Last 2:         Recent Labs  Lab 04/18/16  0121 04/17/16  1311   WBC 9.04 6.35   Hgb 14.0 15.0   Hematocrit 42.0 46.5   Platelets 100* 100*         Recent Labs  Lab 04/17/16  1311   PT 16.0*   PT INR 1.3*       Recent Labs  Lab 04/17/16  1311   Alkaline Phosphatase 166*   Bilirubin, Total 4.5*   Protein, Total 7.1   Albumin 3.7   ALT 32   AST (SGOT) 46*        Recent Labs  Lab 04/18/16  0121 04/17/16  1311   Sodium 132* 134*   Potassium 4.8 4.7   Chloride 105 101   CO2 15* 18*   BUN 68.0* 70.0*   Glucose 107* 80   Calcium 8.7 9.1   Magnesium 2.0 1.8                 Invalid input(s): FREET4         Procedures/Radiology performed:   Radiology: all results from this admission  Ct Head Without Contrast    Result  Date: 04/17/2016   Stable examination of the brain without evidence of acute intracranial pathology. Please see discussion above for details. Prentice Docker, MD 04/17/2016 2:04 PM     Xr Chest  Ap Portable    Result Date: 04/17/2016   No acute findings. [name], MD 04/17/2016 1:47 PM     Surgery: all results from this admission  * No surgery found [facility] Course:     Reason for admission/ HPI:  [name] is a 70 y.o. male with PMH of amyloidosis complicated by CHF (01-65%), CKD Stage 4, asthma, HTN who presents with headache, LE edema, and hypotension, thought to be in acute decompensated heart failure. Pt initally admitted to CCU for concern for cardiogenic shock.     Pt initially presented to hospital due to severe neck pain and headache.  CT head negative for acute intracranial process. Trops elevated at 0.46.     Pt initially admitted at [facility] from 74/09-92/78 for acute systolic heart failure. BNP noted to be >17,000 and CXR showing volume overload. Lactate during that admission was also elevated at 2.4. Echo that admission showed EF of 25-30% with concentric hypertrophy of LV with severely decreased LV systolic function and moderate-severe global hypokinesis of LV. RV is mildly dilated with moderate-severe global hypokinesis of RV. Small pericardial effusion. EKG 10/17 shows NSR with right axis deviation. Recent B/L LE dopplers on 10/27 show mild subcutaenous edema without any evidence of acute DVT. He was discharged on Bumex 6 mg PO BID.  [place] on 10/15 showed redemonstration of small amount of free fluid in the abdomen with mildly dilated IVC/central hepatic vein which may represent mild volume overload related to congestive heart failure, as well as normal appearance of both kidneys without hydronephrosis.     Endorses three-pillow orthopnea, PND, and shortness of breath. He requires sleeping in a recliner due to his profound shortness of breath with lying flat.  Otherwise, also endorses recent weight gain of 5lb in past week. His dry weight is 135. He takes Metolazone PRN on Sunday/tuesday/thursday for weight > 135. Pt continues to have good urinary output. Cardiologist is Dr. Gaynelle Cage. Nephrologist is Dr. Marcy Siren, for which he follows for his CKD. Oncologist is Dr. Yong Channel.    Hospital Course:  Mr. [name] was admitted to the CCU due to concern for cardiogenic shock, however, his BNP was much improved to ~4000 (from >17,000 during [place] admission for acute systolic CHF exacerbation). His CXR was also clear without any vascular congestion or overt signs of overload. Pt was adequately compensated after receiving 1500 cc NS in ED.  He remained HDS with MAP > 75 throughout his hospitalization in the CCU so dopamine therapy was not initiated. AM cortisol normal, thus no adrenal insufficiency. His home Bumex (6 mg PO BID) and Metolazone 5 mg PO x1 (which he previous was taking Sunday/Tuesday/Thursday for weight > his dry weight of 135) was given.     Pt's primary cardiologist, Dr. Cristy Folks was contacted during admission and a follow-up appointment was scheduled after hospital discharge. She preferred an echocardiogram not be repeated while hospitalized at [facility] since an echocardiogram was recently performed during his hospitalization at [facility] earlier in October 2017.    Note if any home meds were changed/discontinued and why: none    Discharge Day Exam:  Temp:  [98 F (36.7 C)-98.6 F (37 C)] 98.2 F (36.8 C)  Heart Rate:  [85-108] 96  Resp Rate:  [10-25] 24  BP: (87-98)/(51-75) 90/70    Physical Exam:  General: Healthy-appearing, in no distress.  Alert and oriented x 3  HEENT: no JVD, macroglossia with scalloping  Pulmonary: Normal respiratory effort, clear lung sounds bilaterally.  Cardiovascular: S3 present, normal S1 and S2, no murmurs or rubs  Abdomen: Soft, nontender, nondistended   Extremities: 1+ pitting edema in B/L LEs from feet to  knees  Skin: No tendinous xanthomas  Psych:  Mood was normal  Neuro:  otherwise non-focal, gait normal    Wounds/decutibus ulcers/stage: none    Consultations:     Treatment Team:   Attending Provider: Nada Libman, MD  Consulting Physician: [name], MD  Resident: Marilynne Halsted, MD  Resident: Weyman Pedro, MD  Resident: Dellie Burns, Felix Ahmadi, MD  Resident: Leighton Ruff, MD  Consulting Physician: Gaspar Bidding, MD  Discharge Condition:     STABLE    Discharge Instructions & Follow Up Plan for Patient:     Diet: Diet cardiac renal 80 GM Protein; 2 GM NA; 2000 ML FLUID    Activity/Weight Bearing Status: WBAT    Patient was instructed to follow up with:     Follow-up Information     Aquilla Hacker, MD. Go on 04/24/2016.    Specialty:  Cardiology  Why:  An appointment for you has been scheduled for 04/24/2016 at 12:00PM. Follow up for heart failure and recent hospitalization at [facility].  Contact information:  [address]  [id number]  [place] [address]  [phone]                   Discharge Code Status:Full Code    Patient Emergency Contact: Primary Emergency Contact: Waynick,[name], Home Phone: [phone]    Complete instructions and follow up are in the patient's After Visit Summary    Minutes spent coordinating discharge and reviewing discharge plan: 45 minutes    Discharge Medications:        Discharge Medication List      Taking    aspirin EC 81 MG EC tablet  Dose:  81 mg  Take 81 mg by mouth daily.     bumetanide 2 MG tablet  Dose:  6 mg  Commonly known as:  BUMEX  Take 6 mg by mouth 2 (two) times daily.     dexamethasone 4 MG tablet  Dose:  20 mg  What changed:   medication strength   how much to take   additional instructions  Commonly known as:  DECADRON  Take 5 tablets (20 mg total) by mouth See Admin Instructions.Take 20 mg of Dexamethasone once a week     fluticasone 50 MCG/ACT nasal spray  Dose:  2 spray  Commonly known as:  FLONASE  2 sprays by Nasal  route daily.     Lenalidomide 5 MG Caps  Dose:  5 mg  What changed:  when to take this  Commonly known as:  REVLIMID  Take 5 mg by mouth every other day.Days 1-21. Hold for 7 days. For Amyloidosis     metOLazone 5 MG tablet  Dose:  5 mg  Commonly known as:  ZAROXOLYN  Take 5 mg by mouth as needed (PRN Sun, Tues Thus if weight >135).     potassium chloride 10 MEQ tablet  Dose:  10 mEq  Commonly known as:  K-TAB,KLOR-CON  Take 10 mEq by mouth 2 (two) times daily.     prochlorperazine 10 MG tablet  Dose:  10 mg  Commonly known as:  COMPAZINE  Take 10 mg by mouth every 6 (six) hours as needed (Nausea).            (FYI: you must refresh the link after final D/C Med reconciliation)    Immunizations provided: [place]   Hospitalist Division  Department of Medicine  P: [phone]  F: [phone]    Signed by: Marilynne Halsted, MD    CC: [name], MD

## 2016-04-18 NOTE — UM Notes (Signed)
Admission Orders His   CollapseHide   Ordered   Start   04/17/16 1602  Admit to Inpatient (ADULT INPATIENT ADMIT PANEL ) Once    Status:    Question Answer Comment   Admitting Physician [place], [place]    Diagnosis Elevated troponin    Estimated Length of Stay > or = to 2 midnights    Tentative Discharge Plan? Home or Self Care    Patient Class Inpatient        04/17/16 1557     Payor Payor: MEDICARE / Plan: MEDICARE PART A AND B / Product Type: *No Product type* /     Results for ASHRITH, SAGAN (MRN [id number]) as of 04/18/2016 10:33   Ref. Range 04/17/2016 13:11 04/17/2016 13:13 04/17/2016 18:53 04/18/2016 01:21 04/18/2016 06:16   i-STAT Troponin Latest Ref Range: 0.00 - 0.09 ng/mL  0.46 (H)      Troponin I Latest Ref Range: 0.00 - 0.09 ng/mL   1.07 (HH) 1.10 (HH) 1.04 (HH)   B-Natriuretic Peptide Latest Ref Range: 0 - 100 pg/mL 4,082 (H)         Reason for coming to the hospital: Elevated troponin  -Level of Care:  ICU - CCU      -Dx - Admission:     ICD-10-CM    1. Elevated troponin R74.8    2. Hypotension, unspecified hypotension type I95.9    3. Nonintractable headache, unspecified chronicity pattern, unspecified headache type R51     resolved   4. Neck pain M54.2     resolved   5. Bilateral lower extremity edema R60.0    6. Dehydration E86.0    7. 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 Also with hypotension. Admitted to CCU for concern for cardiogenic shock and need for vasopressor therapy.    Assessment:  1. Acute on chronic systolic heart failure, secondary to amyloidosis  2. Chronic kidney disease  3. Hypotension  4. Elevated lactate  5. Lower extremity edema    Plan: 04/17/16 Appears adequately compensated at this time s/p 1500 cc NS in ED. MAP >70 at this time. Check repeat  lactate, AM cortisol level given dexamethasone q weekly for amyloidosis. Recent echo at [facility] disclosing depressed EF. Daily weight (dry weight reportedly 135). CXR clear, patient on room air. Consider heart failure team consultation in AM 11/2. PICC line for possible vasopressor necessity.      Plan 04/18/16  Date/Time: 04/18/16 6:33 AM    70 y.o.malewith PMH of amyloidosis complicated byCHF (02-72%), CKD Stage 4, asthma, HTN who presents with headache, LE edema, and hypotension, thought to be in acute decompensated heart failure. Pt initallyadmitted to CCU for concern for cardiogenic shock.      24hr events:  No acute events, had a BM and feels better. No HA, CP, SOB. Stable leg edema.  Cardio:   HFrEF 2/2 amyloid  -trops still slowly trending up, currently 1.10, continue to trend to peak q6  -BNP 4082  -LA downtrending from 3.6 to 2.2  -adding back bumex and metolazone at home doses    #Hypotension  -HFrEf exacerbation  -AM cortisol appropriate, no concern for AI  -not currently requiring pressors  -s/p PICC placement    Renal /Fluid, Electrolytes:   #CKD, baseline 2.9  -creatinine 2.8  -  f/u nephro recs    Nutrition: Cardiac diet    Infectious Disease (ID):   #elevated lactate  -downtrending after 1.5L fluids  -f/u bcx           Hem/Onc:   #Amyloid  -continue home med Revlamid 35m PO qOD  -continue Decadron 44mPO qweek, last dose 10/31    Prophylaxis:  - DVT:  subq hep    Code Status: Full code    Dispo: Discharge if BPs tolerate addition of Bumex and metolazone      MCG:    -PMH   has a past medical history of Amyloidosis; Arthritis; Asthma without status asthmaticus; CHF (congestive heart failure); Fracture of unspecified bones; and Malignant neoplasm.      -Social History    reports that he has never smoked. He has never used smokeless tobacco. He reports that he drinks alcohol. He reports that he does not use drugs.      -V/S while in ED  04/17/16 1434 -- -- --  108 95 % -- 16  85/56         -ED  meds given   ED Medication Orders     Start Ordered     Status Ordering Provider    04/17/16 1533 04/17/16 1532    Continuous     Route: Intravenous  Ordered Dose: 10 mcg/kg/min     Discontinued BELSKY, JAMIEE L    04/17/16 1442 04/17/16 1441  sodium chloride 0.9 % bolus 500 mL  Once     Route: Intravenous  Ordered Dose: 500 mL     Last MAR action:  New Bag BELSKY, JAMIEE L    04/17/16 1423 04/17/16 1422  sodium chloride 0.9 % bolus 500 mL  Once     Route: Intravenous  Ordered Dose: 500 mL     Last MAR action:  Stopped BELSKY, JAMIEE L    04/17/16 1325 04/17/16 1324  aspirin chewable tablet 324 mg  Once     Route: Oral  Ordered Dose: 324 mg     Last MAR action:  Given BELSKY, JAMIEE L    04/17/16 1301 04/17/16 1300  sodium chloride 0.9 % bolus 500 mL  Once     Route: Intravenous  Ordered Dose: 500 mL     Last MAR action:  Stopped BELSKY, JAMIEE L          Current meds  Scheduled Meds:  Current Facility-Administered Medications   Medication Dose Route Frequency   . aspirin EC  81 mg Oral Daily   . bumetanide  6 mg Oral BID   . fluticasone  2 spray Each Nare Daily   . heparin (porcine)  5,000 Units Subcutaneous Q8H SCCresco . influenza  0.5 mL Intramuscular Once   . NON-FORMULARY PAT OWN MED order form  5 mg Oral Every Other Day   . polyethylene glycol  17 g Oral Daily     Continuous Infusions:   PRN Meds:.acetaminophen, naloxone      -Labs  Results     Procedure Component Value Units Date/Time    Blood Culture Aerobic and Anaerobic (age 42 29r older) [[id number]]ollected:  04/17/16 1321    Specimen:  Blood from Blood Updated:  04/18/16 0836    Narrative:       [id number] Micro Results of pos bldcul. Results read back byWG:[id number]  [id number] on 04/18/2016 at 08:36  ORDER#: [id number]  ORDERED BY: BELSKY, JAMIEE  SOURCE: Blood [facility] - Arm                        COLLECTED:  04/17/16 13:21  ANTIBIOTICS AT COLL.:                                RECEIVED :  04/17/16 16:38  [id number]  called  Micro Results of pos bldcul. Results read back [id number], by [id number] on 04/18/2016 at 08:36  Culture Blood Aerobic and Anaerobic        PRELIM      04/18/16 08:36   +  04/18/16   Anaerobic Blood Culture Positive in less than 24 hrs             Gram Stain Shows: Gram positive cocci in chains             Identification to follow      Troponin I [[id number]]  (Abnormal) Collected:  04/18/16 0616    Specimen:  Blood Updated:  04/18/16 0731     Troponin I 1.04 (HH) ng/mL     Lactic acid, plasma [[id number]]  (Abnormal) Collected:  04/18/16 0616    Specimen:  Blood Updated:  04/18/16 0633     Lactic acid 2.2 (H) mmol/L     Cortisol [[id number]] Collected:  04/18/16 0121    Specimen:  Blood Updated:  04/18/16 0434     Cortisol 16.7 ug/dL     Troponin I [[id number]]  (Abnormal) Collected:  04/18/16 0121    Specimen:  Blood Updated:  04/18/16 0241     Troponin I 1.10 (HH) ng/mL     Basic Metabolic Panel [[id number]]  (Abnormal) Collected:  04/18/16 0121    Specimen:  Blood Updated:  04/18/16 0202     Glucose 107 (H) mg/dL      BUN 68.0 (H) mg/dL      Creatinine 2.7 (H) mg/dL      Calcium 8.7 mg/dL      Sodium 132 (L) mEq/L      Potassium 4.8 mEq/L      Chloride 105 mEq/L      CO2 15 (L) mEq/L     GFR [[id number]] Collected:  04/18/16 0121     Updated:  04/18/16 0202     EGFR 28.5    Magnesium [[id number]] Collected:  04/18/16 0121     Updated:  04/18/16 0202     Magnesium 2.0 mg/dL     CBC with differential [[id number]]  (Abnormal) Collected:  04/18/16 0121    Specimen:  Blood from Blood Updated:  04/18/16 0150     WBC 9.04 x10 3/uL      Hgb 14.0 g/dL      Hematocrit 42.0 %      Platelets 100 (L) x10 3/uL      RBC 4.33 (L) x10 6/uL      MCV 97.0 fL      MCH 32.3 (H) pg      MCHC 33.3 g/dL      RDW 15 %      MPV 11.9 fL      Neutrophils 86.1 %      Lymphocytes Automated 6.9 %      Monocytes 6.2 %      Eosinophils Automated 0.2 %      Basophils Automated 0.2 %      Immature Granulocyte 0.4 %  Nucleated RBC 0.0 /100 WBC       Neutrophils Absolute 7.78 x10 3/uL      Abs Lymph Automated 0.62 x10 3/uL      Abs Mono Automated 0.56 x10 3/uL      Abs Eos Automated 0.02 x10 3/uL      Absolute Baso Automated 0.02 x10 3/uL      Absolute Immature Granulocyte 0.04 x10 3/uL      Absolute NRBC 0.00 x10 3/uL     Lactic acid, plasma [[id number]]  (Abnormal) Collected:  04/18/16 0121    Specimen:  Blood Updated:  04/18/16 0136     Lactic acid 2.9 (H) mmol/L     MRSA culture [[id number]] Collected:  04/17/16 1853    Specimen:  Body Fluid from Nares and Throat Updated:  04/17/16 2216    Troponin I [[id number]]  (Abnormal) Collected:  04/17/16 1853    Specimen:  Blood Updated:  04/17/16 2023     Troponin I 1.07 (HH) ng/mL     Lactic acid, plasma - Sepsis specimen #2, follow-up to positive initial lactate value. [[id number]]  (Abnormal) Collected:  04/17/16 1853    Specimen:  Blood Updated:  04/17/16 1859     Lactic acid 3.6 (H) mmol/L     D-Dimer [[id number]]  (Abnormal) Collected:  04/17/16 1311     Updated:  04/17/16 1717     D-Dimer 2.99 (H) ug/mL FEU     B-type Natriuretic Peptide [[id number]]  (Abnormal) Collected:  04/17/16 1311    Specimen:  Blood Updated:  04/17/16 1458     B-Natriuretic Peptide 4,082 (H) pg/mL     CBC with differential [[id number]]  (Abnormal) Collected:  04/17/16 1311    Specimen:  Blood from Blood Updated:  04/17/16 1426     WBC 6.35 x10 3/uL      Hgb 15.0 g/dL      Hematocrit 46.5 %      Platelets 100 (L) x10 3/uL      RBC 4.69 (L) x10 6/uL      MCV 99.1 fL      MCH 32.0 pg      MCHC 32.3 g/dL      RDW 15 %      MPV 11.5 fL      Neutrophils 94.5 %      Lymphocytes Automated 3.5 %      Monocytes 0.9 %      Eosinophils Automated 0.3 %      Basophils Automated 0.3 %      Immature Granulocyte 0.5 %      Nucleated RBC 0.0 /100 WBC      Neutrophils Absolute 6.00 x10 3/uL      Abs Lymph Automated 0.22 (L) x10 3/uL      Abs Mono Automated 0.06 x10 3/uL      Abs Eos Automated 0.02 x10 3/uL      Absolute Baso Automated 0.02 x10 3/uL       Absolute Immature Granulocyte 0.03 x10 3/uL      Absolute NRBC 0.00 x10 3/uL     UA, Reflex to Microscopic (pts  3 + yrs) [[id number]]  (Abnormal) Collected:  04/17/16 1321    Specimen:  Urine Updated:  04/17/16 1410     Urine Type Clean Catch     Color, UA Yellow     Clarity, UA Clear     Specific Gravity UA 1.010     Urine pH 6.0     Leukocyte Esterase, UA Negative  Nitrite, UA Negative     Protein, UR 100 (A)     Glucose, UA Negative     Ketones UA Negative     Urobilinogen, UA Normal mg/dL      Bilirubin, UA Negative     Blood, UA Small (A)     RBC, UA 3 - 5 /hpf      Hyaline Casts, UA 6 - 10 (A) /lpf      Oval Fat Bodies, UA Rare (A) /hpf     Prothrombin time/INR [[id number]]  (Abnormal) Collected:  04/17/16 1311    Specimen:  Blood Updated:  04/17/16 1404     PT 16.0 (H) sec      PT INR 1.3 (H)     PT Anticoag. Given Within 48 hrs. None    Comprehensive metabolic panel [[id number]]  (Abnormal) Collected:  04/17/16 1311    Specimen:  Blood Updated:  04/17/16 1352     Glucose 80 mg/dL      BUN 70.0 (H) mg/dL      Creatinine 2.8 (H) mg/dL      Sodium 134 (L) mEq/L      Potassium 4.7 mEq/L      Chloride 101 mEq/L      CO2 18 (L) mEq/L      Calcium 9.1 mg/dL      Protein, Total 7.1 g/dL      Albumin 3.7 g/dL      AST (SGOT) 46 (H) U/L      ALT 32 U/L      Alkaline Phosphatase 166 (H) U/L      Bilirubin, Total 4.5 (H) mg/dL      Globulin 3.4 g/dL      Albumin/Globulin Ratio 1.1    Magnesium [[id number]] Collected:  04/17/16 1311    Specimen:  Blood Updated:  04/17/16 1352     Magnesium 1.8 mg/dL     GFR [[id number]] Collected:  04/17/16 1311     Updated:  04/17/16 1352     EGFR 27.4    i-Stat CG4 Venous CartrIDge [[id number]]  (Abnormal) Collected:  04/17/16 1311     Updated:  04/17/16 1325     i-STAT pH Venous 7.312     i-STAT pCO2 Venous 44.8     i-STAT pO2 Venous 14.0     i-STAT HCO3 Bicarbonate Venous 22.8 mEq/L      i-STAT Total CO2 Venous 24.0 mEq/L      i-STAT Base Excess Venous -4.0 mEq/L      i-STAT O2  Saturation Venous 16.0 %      i-STAT Lactic acid 3.4 (H) mmol/L      i-STAT Patient Temperature 97.4     i-STAT FIO2 21     i-STAT Allen's Test NA     i-STAT Draw Site Venous    i-Stat Troponin [[id number]]  (Abnormal) Collected:  04/17/16 1313     Updated:  04/17/16 1325     i-STAT Troponin 0.46 (H) ng/mL           Ct Head Without Contrast    Result Date: 04/17/2016   Stable examination of the brain without evidence of acute intracranial pathology. Please see discussion above for details. Prentice Docker, MD 04/17/2016 2:04 PM     Xr Chest  Ap Portable    Result Date: 04/17/2016   No acute findings. [name], MD 04/17/2016 1:47 PM

## 2016-04-18 NOTE — Plan of Care (Addendum)
Bundle letter delivered and explained to the pt. When asked about the cardiologist pt reports that he follows with Dr Gaynelle Cage in [place] and has an appt set up for the follow up. The CHF pt education booklet used to review pt education.    CHF warning zones reviewed: yes, RN reviewed the Yellow zone symptoms at length and reinforced the importance of close follow up with the Cardiology team  Daily weights: reviewed, pt reports compliance  Strict I/Os: reviewed  Fluid restriction: 48-60 oz  Heart healthy diet: reviewed at length, sample dine out menus reviewed and pt made aware to make healthy choice. RN reinforced the importance of the quantity and serving portion sizes.  Echo/EF: 25-30% per cardiology notes  Cardiac rehab: brochure reviewed and RN encouraged pt to enroll once seen and cleared by cardiology  90 days commitment to care: handout reviewed at length  Follow up Cardiology appt: scheduled    Dry weight: last weight on file 63.5 kg    Zola Button, RN, MSN, [facility]  Patient Care Navigator, [place]  [phone]

## 2016-04-18 NOTE — Plan of Care (Signed)
Patient is alert and oriented x 4.  Afebrile, hemodynamically stable, pain free.  On room air. Cardiac diet. Skin intact. Edema lower extremities.

## 2016-04-18 NOTE — Progress Notes (Signed)
04/18/16 1034   Patient Type   Within 30 Days of Previous Admission? No   Healthcare Decisions   Interviewed: Patient   Orientation/Decision Making Abilities of Patient Alert and Oriented x3, able to make decisions   Advance Directive Patient does not have advance directive   [place] Agent's Name [place] Agent's Phone Number [phone]    Prior to admission   Prior level of function Independent with ADLs;Ambulates independently   Type of Residence Private residence   Home Layout Multi-level   Have running water, electricity, heat, etc? Yes   Living Arrangements Spouse/significant other   How do you get to your MD appointments? Patient, wife   How do you get your groceries? Patient, wife   Who fixes your meals? Patient, wife   Who does your laundry? Patient, wife   Who picks up your prescriptions? Patient, wife   Dressing Independent   Grooming Independent   Feeding Independent   Bathing Independent   Toileting Independent   DME Currently at Home (N/A)   Adult Protective Services (APS) involved? No   Discharge Planning   Support Systems Spouse/significant other;Children   Patient expects to be discharged to: Home   Anticipated [place] plan discussed with: Same as interviewed   [place] discussion contact information: Patient   Follow up appointment scheduled? No   Reason no follow up scheduled? Other (comment)  (closer to [place]; PCP first name is Lattie Haw)   Mode of transportation: Private car (family member)   Consults/Providers   PT Evaluation Needed 2   OT Evalulation Needed 2   Correct PCP listed in Epic? No (comment)  (Patient unsure of last name)   Important Message from [facility] Notice   Patient received 1st IMM Letter? Yes   Date of most recent IMM given: 04/17/16   Patient has the support of his wife for transport and assistance PRN.  Doroteo Glassman, LSW  Case Management

## 2016-04-19 NOTE — Progress Notes (Signed)
Transitional Care Management    Enrollment--    Name/Number of person who participated in call:  Gregory [name]- [phone]    TCM Program explained to patient and TCM contact information provided: Yes    HIPAA verification completed and notified that call is recorded:  Yes     Primary language spoken: English    Interpreter needed:  No        Health History--    Health Status (PMH, current admission summary, previous admission history):  Past Medical History:   Diagnosis Date   . Amyloidosis    . Arthritis     bilat shoulders, lt knee   . Asthma without status asthmaticus     childhood   . CHF (congestive heart failure)    . Fracture of unspecified bones     1974 lt shoulder   . Malignant neoplasm      Patient Active Problem List   Diagnosis   . Ganglion cyst of wrist, right   . Primary localized osteoarthrosis, shoulder region   . Asthma without status asthmaticus   . Acute on chronic congestive heart failure, unspecified congestive heart failure type   . Elevated troponin   . Bilateral lower extremity edema       On 04/17/16, pt. Was admitted to [facility] for CHF. Pt. Also has a history of Asthma and CKD    All eligible TCM Diagnoses (include A1C, EF, weight):           Pt. Is 5'6" and 135lbs EF is 25-30%    Does the patient understand why he/she was admitted to the hospital:  Yes     Does the patient understand what the discharge diagnosis was: Yes- Heart Failure     What was the patient's chief complaint upon hospital arrival: Headache, edema      Medication Reconciliation--    Medication List:  Current Outpatient Prescriptions   Medication Sig Dispense Refill   . aspirin EC 81 MG EC tablet Take 81 mg by mouth daily.     . bumetanide (BUMEX) 2 MG tablet Take 6 mg by mouth 2 (two) times daily.     [name] dexamethasone (DECADRON) 4 MG tablet Take 5 tablets (20 mg total) by mouth See Admin Instructions.Take 20 mg of Dexamethasone once a week     . fluticasone (FLONASE) 50 MCG/ACT nasal spray 2 sprays by  Nasal route daily.     . Lenalidomide (REVLIMID) 5 MG Cap Take 5 mg by mouth every other day.Days 1-21. Hold for 7 days. For Amyloidosis     . metOLazone (ZAROXOLYN) 5 MG tablet Take 5 mg by mouth as needed (PRN Sun, Tues Thus if weight >135).     . potassium chloride (K-TAB,KLOR-CON) 10 MEQ tablet Take 10 mEq by mouth 2 (two) times daily.     . prochlorperazine (COMPAZINE) 10 MG tablet Take 10 mg by mouth every 6 (six) hours as needed (Nausea).       No current facility-administered medications for this visit.        Were the medications reviewed with the patient:  Yes     Was the patient able to obtain all of his/her medications when they left the hospital:  Yes     Does the patient understand what all of his/her medications are for: Yes- CM and pt. Reviewed all of pt's medications, doses and uses    Patient aware of side effects from provided teaching in hospital or pamphlets: No  Was teaching done on medications: Yes     Plan for resolution of any medication concerns: None. Medication Reconciliation completed successfully    Physician Follow Up--    Was the patient scheduled/seen by PCP or cardiologist within critical timeframe of hospital discharge: No  - Cardiologist on 04/24/16- pt. Did not wish for CM to call for an earlier appt. Or come to [facility]. Pt. Also declined VNA visit.    Name/number of PCP: None. CM offered to send a list of [place] doctors, but pt. declined    Name/number of cardiologist: Dr. Gaynelle Cage- 04/24/16- [phone]    Letter sent to PCP notifying of TCM involvement: No           Home Health Co-Management--    Is patient being followed by Home Health: No           CHF Management--    What is patient's understanding of CHF: heart disease    Does the patient have a scale: Yes    Does the patient weigh himself/herself daily: Yes    Does the patient understand the importance of when (in AM) s/he should be weighed and why (consistency and reporting): Yes    Has the patient's MD told him/her what  his/her "dry weight" should be: About 130-135      What was weight since hospital discharge: 135lbs    Is the patient keeping a weight log: Yes    Does the patient understand why "dry weight" is important: Yes    Was the patient educated on the CHF red flags/symptoms: Yes   Weight gain of 2 pounds in a day or 5 pounds in a week  Increased swelling of your hands, abdomen, legs, or ankles   Increase in shortness of breath with activity  Increase in the number of pillows needed to sleep at night  New or more frequent chest pain or tightness  New onset of dizziness or new onset of dizziness or lightheadedness after standing up    Is the patient currently experiencing any of the CHF red flags/symptoms: No       Was the patient instructed on what to do if he/she is experiencing any of the CHF red flags/symptoms: Yes - Call Cardiologist to report symptoms and schedule an emergent appt. If needed    Is patient familiar with the CHF zones: Yes    Education on zones provided: Yes- CM and pt. Reviewed the CHF Zones and when to call his doctor    Was [place] [facility] TCM visit initiated: No  - Pt. declined    Does patient meet EF requirements for cardiac rehab?  Yes    If yes, patient notified of cardiac rehab services and that a cardiac rehab team member will be calling patient with more information:  Yes    Date patient was referred to cardiac rehab: 04/19/16    Educational materials mailed to patient:  No- Pt. Already had received the materials at the hospital      Call Summary Notes-- Spoke to pt. Who reported he was doing fine. CM and pt. Reviewed red flag symptoms of CHF and pt. Denied having any at this time. CM amd pt. Reviewed all of pt's medications, none new, and Medication Reconciliation was completed successfully. Pt. Will see his Cardiologist on 04/24/16. CM will contact pt. Next week for further CHF Management.    Gregory Schmidt, MS, [facility]  Case Manager  [place]  T [phone]  F  [phone]

## 2016-04-21 ENCOUNTER — Emergency Department
Admission: EM | Admit: 2016-04-21 | Discharge: 2016-04-21 | Disposition: A | Discharge status: Home / Self Care | Payer: Medicare Other | Attending: Emergency Medicine | Admitting: Emergency Medicine

## 2016-04-21 ENCOUNTER — Emergency Department: Payer: Medicare Other

## 2016-04-21 DIAGNOSIS — M10041 Idiopathic gout, right hand: Secondary | ICD-10-CM | POA: Insufficient documentation

## 2016-04-21 DIAGNOSIS — I509 Heart failure, unspecified: Secondary | ICD-10-CM | POA: Insufficient documentation

## 2016-04-21 DIAGNOSIS — Z7982 Long term (current) use of aspirin: Secondary | ICD-10-CM | POA: Insufficient documentation

## 2016-04-21 HISTORY — DX: Gout, unspecified: M10.9

## 2016-04-21 MED ORDER — CEPHALEXIN 500 MG PO CAPS
500.0000 mg | ORAL_CAPSULE | Freq: Three times a day (TID) | ORAL | 0 refills | Status: AC
Start: 2016-04-21 — End: 2016-04-28

## 2016-04-21 MED ORDER — CEPHALEXIN 500 MG PO CAPS
500.0000 mg | ORAL_CAPSULE | Freq: Once | ORAL | Status: AC
Start: 2016-04-21 — End: 2016-04-21
  Administered 2016-04-21: 500 mg via ORAL
  Filled 2016-04-21: qty 1

## 2016-04-21 MED ORDER — OXYCODONE-ACETAMINOPHEN 5-325 MG PO TABS
2.0000 | ORAL_TABLET | Freq: Once | ORAL | Status: AC
Start: 2016-04-21 — End: 2016-04-21
  Administered 2016-04-21: 2 via ORAL
  Filled 2016-04-21: qty 2

## 2016-04-21 MED ORDER — OXYCODONE HCL 5 MG PO TABS
5.0000 mg | ORAL_TABLET | ORAL | 0 refills | Status: DC | PRN
Start: 2016-04-21 — End: 2016-05-06

## 2016-04-21 NOTE — ED Triage Notes (Signed)
Right hand joints swelling. Denies injury. + tenderness. Limited ROM. NAD

## 2016-04-21 NOTE — Discharge Instructions (Signed)
Gout     You have been diagnosed with gout.     Gout is a very common disease. It is caused by an inflammatory response to crystals in the joint. The big toe is the most commonly affected area. The usual symptoms include redness and pain. A gout attack may be brought on by certain foods or medicines.     A short run of NSAID (Non-Steroidal Anti-Inflammatory Drug) treatment usually works well for this disease. If the symptoms don't go away, you may need a different medicine. You may also need to be placed on a long-term medicine to prevent symptoms. Follow up with your regular doctor for this.     YOU SHOULD SEEK MEDICAL ATTENTION IMMEDIATELY, EITHER HERE OR AT THE NEAREST EMERGENCY DEPARTMENT, IF ANY OF THE FOLLOWING OCCURS:  · Fever (temperature higher than 100.4°F / 38°C).  · Red streaks outside the area of pain into the extremity (foot or arm).  · Pain not helped by the medicine.               Gout Diet (Edu)     Your doctor wanted you to have the following information about a diet that can lessen your gout symptoms...      If you have gout, then your body moves uric acid to the joints of your body. This can be very painful. Uric acid is formed when your body breaks down a chemical called purine. Your body gets a lot of purine from the food you eat. So, eating food that is low in purine and uric acid can lessen the chance of having a gout attack.     Tips for a diet that keeps gout attacks from happening…     · Losing weight decreases the chance of having a gout attack. So, you should eat a diet that helps you lose weight, such as a low-calorie diet.      · Dehydration can also set off a gout attack. So, you should drink more water. You should also drink less soda, juice, and other sugary drinks.     · You should also eat complex carbohydrates such as whole wheat, unprocessed sugars, whole grains, and brown rice.     · You should not drink too much alcohol. This includes beer and wine.     · You should eat less  red meat. Also, you should not eat organ meats, such as liver and kidneys.     · You might want to drink more coffee. (According to some studies, coffee can lower your uric acid level.)     · You should eat a diet with a lot of vitamin C.     · You should drink dairy products that are low in fat, such as 2% or skim milk.

## 2016-04-21 NOTE — ED Provider Notes (Addendum)
Physician/Midlevel provider first contact with patient: 04/21/16 1751         History     Chief Complaint   Patient presents with   . Hand Pain     Gout in right index finger red and warmth and pain for two days      The history is provided by the patient.   Hand Pain   This is a new problem. The current episode started yesterday. The problem occurs constantly. The problem has been gradually worsening. Nothing aggravates the symptoms. He has tried nothing for the symptoms. The treatment provided no relief.            Past Medical History:   Diagnosis Date   . Amyloidosis    . Arthritis     bilat shoulders, lt knee   . Asthma without status asthmaticus     childhood   . CHF (congestive heart failure)    . Fracture of unspecified bones     1974 lt shoulder   . Gout    . Malignant neoplasm        Past Surgical History:   Procedure Laterality Date   . ARTHROPLASTY, SHOULDER, TOTAL  06/18/2013    Procedure: ARTHROPLASTY, SHOULDER, TOTAL;  Surgeon: Wendi Snipes, MD;  Location: [facility];  Service: Orthopedics;  Laterality: Left;   . COLONOSCOPY     . EXCISION, GANGLION  12/19/2012    Procedure: EXCISION, GANGLION;  Surgeon: Jillyn Hidden, MD;  Location: [facility];  Service: Orthopedics;  Laterality: Right;  injection right and left shoulder   . HERNIA REPAIR      lt inguinal age 66   . INJECTION, MEDICATION  12/19/2012    Procedure: INJECTION, MEDICATION;  Surgeon: Jillyn Hidden, MD;  Location: [facility];  Service: Orthopedics;  Laterality: Bilateral;       Family History   Problem Relation Age of Onset   . Family history unknown: Yes       Social  Social History   Substance Use Topics   . Smoking status: Never Smoker   . Smokeless tobacco: Never Used   . Alcohol use Yes      Comment: rare       .     No Known Allergies    Home Medications             aspirin EC 81 MG EC tablet     Take 81 mg by mouth daily.     bumetanide (BUMEX) 2 MG tablet     Take 6 mg by mouth 2 (two) times daily.      dexamethasone (DECADRON) 4 MG tablet     Take 5 tablets (20 mg total) by mouth See Admin Instructions.Take 20 mg of Dexamethasone once a week     fluticasone (FLONASE) 50 MCG/ACT nasal spray     2 sprays by Nasal route daily.     Lenalidomide (REVLIMID) 5 MG Cap     Take 5 mg by mouth every other day.Days 1-21. Hold for 7 days. For Amyloidosis     Lenalidomide (REVLIMID) 5 MG Cap     Take by mouth.     metOLazone (ZAROXOLYN) 5 MG tablet     Take 5 mg by mouth as needed (PRN Sun, Tues Thus if weight >135).     metOLazone (ZAROXOLYN) 5 MG tablet     Take 5 mg by mouth daily.     potassium chloride (K-TAB,KLOR-CON) 10  MEQ tablet     Take 10 mEq by mouth 2 (two) times daily.     prochlorperazine (COMPAZINE) 10 MG tablet     Take 10 mg by mouth every 6 (six) hours as needed (Nausea).           Review of Systems   All other systems reviewed and are negative.      Physical Exam    BP: 106/79, Heart Rate: 99, Temp: 97.4 F (36.3 C), Resp Rate: 16, SpO2: 98 %, Weight: 64 kg    Physical Exam   Constitutional: He appears well-developed and well-nourished.   HENT:   Head: Normocephalic and atraumatic.   Musculoskeletal: He exhibits edema and tenderness.   Right index finger erythema and mild warmth and edema, NV intact CFT normal   Neurological: He is alert.   Skin: Skin is warm. Capillary refill takes less than 2 seconds. He is not diaphoretic.   Psychiatric: He has a normal mood and affect.   Vitals reviewed.        MDM and ED Course     ED Medication Orders     Start Ordered     Status Ordering Provider    04/21/16 0754 04/21/16 0753  cephalexin (KEFLEX) capsule 500 mg  Once     Route: Oral  Ordered Dose: 500 mg     Awilda Metro ALAN    04/21/16 0754 04/21/16 0753  oxyCODONE-acetaminophen (PERCOCET) 5-325 MG per tablet 2 tablet  Once     Route: Oral  Ordered Dose: 2 tablet     Ordered Ned Card             MDM      ED Course              Procedures    Clinical Impression & Disposition      Clinical Impression  Final diagnoses:   Acute idiopathic gout of right hand        ED Disposition     ED Disposition Condition Date/Time Comment    Discharge  Sun Apr 21, 2016  7:54 AM [name] discharge to home/self care.    Condition at disposition: Stable           New Prescriptions    CEPHALEXIN (KEFLEX) 500 MG CAPSULE    Take 1 capsule (500 mg total) by mouth 3 (three) times daily.for 7 days    OXYCODONE (ROXICODONE) 5 MG IMMEDIATE RELEASE TABLET    Take 1-2 tablets (5-10 mg total) by mouth every 4 (four) hours as needed for Pain.1-2 tablets every 4-6 hours as needed for pain                 Ned Card, MD  04/21/16 3154       Ned Card, MD  04/21/16 [id number]

## 2016-04-22 ENCOUNTER — Other Ambulatory Visit: Payer: Self-pay

## 2016-04-25 NOTE — Progress Notes (Signed)
Transitional Care Management--Cardiac Bundle CHF Call 2      CHF Management-Teachback--    Can the patient identify the signs and symptoms that CHF is worsening: Yes- sob, swelling, wt. Gain, HA     Can the patient identify what to do if he/she experiences these symptoms: Yes- Call Cardiologist to report symptoms, schedule an appt. If needed       Physician Follow Up/Medication Follow Up--    Was the patient seen by PCP or cardiologist within 3-5 days of hospital discharge: No-    No  - Cardiologist on 04/24/16- pt. Did not wish for CM to call for an earlier appt. Or come to [facility]. Pt. Also declined [facility] visit.    If no, plan for patient to be seen by PCP or cardiologist: Yes Cardiologist 04/24/2016    Were any medications changed at follow up medical appointment: Yes- Bumex increased to 3x/day       Cardiac Rehab Assessment--    Where is patient in Cardiac Rehab referral process: Pt's doctor did not yet wish for pt. To participate in Cardiac Rehabilitation, will discuss it at next appt.      Depression Screen--    Over the past 4 weeks, has the patient felt down, depressed or hopeless: No      Over the past 4 weeks, has the patients felt little interest or pleasure in doing things: No          Gaps in Care/Resource Connection--    Living situation (alone, family, home environment, etc): Pt. Resides with his spouse, nieces and nephews in [place], [place]        Call Summary Notes-- Spoke to pt. Who reported he was doing great. Pt. Did have an ED visit for a gout flare and is looking for a Rheumatologist for treatment. CM will assist. Pt. Was able to teach back symptoms of CHF and had some swelling and weight gain, but Bumex was increased and both have come down. Pt. Completed the Depression Screen and there is no indication that pt. Is down or sad. Pt. Saw his Cardiologist on 04/24/16. CM will give pt. Referrals for Rheumatologists during next weeks call.    [name], [place], [place]  T [phone]  F [phone]

## 2016-04-29 ENCOUNTER — Emergency Department: Payer: Medicare Other

## 2016-04-29 ENCOUNTER — Emergency Department
Admission: EM | Admit: 2016-04-29 | Discharge: 2016-04-29 | Disposition: A | Discharge status: Home / Self Care | Payer: Medicare Other | Attending: Emergency Medicine | Admitting: Emergency Medicine

## 2016-04-29 DIAGNOSIS — Z7982 Long term (current) use of aspirin: Secondary | ICD-10-CM | POA: Insufficient documentation

## 2016-04-29 DIAGNOSIS — I509 Heart failure, unspecified: Secondary | ICD-10-CM | POA: Insufficient documentation

## 2016-04-29 DIAGNOSIS — Z79899 Other long term (current) drug therapy: Secondary | ICD-10-CM | POA: Insufficient documentation

## 2016-04-29 DIAGNOSIS — L03113 Cellulitis of right upper limb: Secondary | ICD-10-CM

## 2016-04-29 LAB — GFR: EGFR: 21.9

## 2016-04-29 LAB — COMPREHENSIVE METABOLIC PANEL
ALT: 35 U/L (ref 0–55)
AST (SGOT): 80 U/L — ABNORMAL HIGH (ref 5–34)
Albumin/Globulin Ratio: 1.1 (ref 0.9–2.2)
Albumin: 3.6 g/dL (ref 3.5–5.0)
Alkaline Phosphatase: 161 U/L — ABNORMAL HIGH (ref 38–106)
Anion Gap: 18 — ABNORMAL HIGH (ref 5.0–15.0)
BUN: 94 mg/dL — ABNORMAL HIGH (ref 9.0–28.0)
Bilirubin, Total: 5.2 mg/dL — ABNORMAL HIGH (ref 0.2–1.2)
CO2: 22 mEq/L (ref 22–29)
Calcium: 9.3 mg/dL (ref 8.5–10.5)
Chloride: 91 mEq/L — ABNORMAL LOW (ref 100–111)
Creatinine: 3.4 mg/dL — ABNORMAL HIGH (ref 0.7–1.3)
Globulin: 3.4 g/dL (ref 2.0–3.6)
Glucose: 118 mg/dL — ABNORMAL HIGH (ref 70–100)
Potassium: 5.1 mEq/L (ref 3.5–5.1)
Protein, Total: 7 g/dL (ref 6.0–8.3)
Sodium: 131 mEq/L — ABNORMAL LOW (ref 136–145)

## 2016-04-29 LAB — CBC AND DIFFERENTIAL
Absolute NRBC: 0 10*3/uL
Basophils Absolute Automated: 0.03 10*3/uL (ref 0.00–0.20)
Basophils Automated: 0.5 %
Eosinophils Absolute Automated: 0.07 10*3/uL (ref 0.00–0.70)
Eosinophils Automated: 1.2 %
Hematocrit: 43.9 % (ref 42.0–52.0)
Hgb: 15.4 g/dL (ref 13.0–17.0)
Immature Granulocytes Absolute: 0.02 10*3/uL
Immature Granulocytes: 0.3 %
Lymphocytes Absolute Automated: 1.08 10*3/uL (ref 0.50–4.40)
Lymphocytes Automated: 18.4 %
MCH: 32.8 pg — ABNORMAL HIGH (ref 28.0–32.0)
MCHC: 35.1 g/dL (ref 32.0–36.0)
MCV: 93.4 fL (ref 80.0–100.0)
MPV: 11.1 fL (ref 9.4–12.3)
Monocytes Absolute Automated: 0.66 10*3/uL (ref 0.00–1.20)
Monocytes: 11.3 %
Neutrophils Absolute: 4 10*3/uL (ref 1.80–8.10)
Neutrophils: 68.3 %
Nucleated RBC: 0 /100 WBC (ref 0.0–1.0)
Platelets: 139 10*3/uL — ABNORMAL LOW (ref 140–400)
RBC: 4.7 10*6/uL (ref 4.70–6.00)
RDW: 14 % (ref 12–15)
WBC: 5.86 10*3/uL (ref 3.50–10.80)

## 2016-04-29 LAB — C-REACTIVE PROTEIN: C-Reactive Protein: 6.8 mg/dL — ABNORMAL HIGH (ref 0.0–0.8)

## 2016-04-29 LAB — URIC ACID: Uric acid: 13.3 mg/dL — ABNORMAL HIGH (ref 3.6–9.7)

## 2016-04-29 LAB — ETHANOL: Alcohol: NOT DETECTED mg/dL

## 2016-04-29 LAB — SEDIMENTATION RATE: Sed Rate: 25 mm/Hr — ABNORMAL HIGH (ref 0–15)

## 2016-04-29 MED ORDER — HYDROCODONE-ACETAMINOPHEN 5-325 MG PO TABS
1.0000 | ORAL_TABLET | Freq: Four times a day (QID) | ORAL | 0 refills | Status: DC | PRN
Start: 2016-04-29 — End: 2016-06-07

## 2016-04-29 MED ORDER — CEFTRIAXONE SODIUM IN DEXTROSE 20 MG/ML IV SOLN
1.0000 g | Freq: Once | INTRAVENOUS | Status: AC
Start: 2016-04-29 — End: 2016-04-29
  Administered 2016-04-29: 1 g via INTRAVENOUS
  Filled 2016-04-29: qty 50

## 2016-04-29 MED ORDER — OXYCODONE-ACETAMINOPHEN 5-325 MG PO TABS
1.0000 | ORAL_TABLET | Freq: Once | ORAL | Status: AC
Start: 2016-04-29 — End: 2016-04-29
  Administered 2016-04-29: 1 via ORAL
  Filled 2016-04-29: qty 1

## 2016-04-29 MED ORDER — CLINDAMYCIN HCL 150 MG PO CAPS
300.0000 mg | ORAL_CAPSULE | Freq: Once | ORAL | Status: AC
Start: 2016-04-29 — End: 2016-04-29
  Administered 2016-04-29: 300 mg via ORAL
  Filled 2016-04-29: qty 2

## 2016-04-29 MED ORDER — CEPHALEXIN 500 MG PO CAPS
500.0000 mg | ORAL_CAPSULE | Freq: Two times a day (BID) | ORAL | 0 refills | Status: DC
Start: 2016-04-29 — End: 2016-05-13

## 2016-04-29 MED ORDER — CLINDAMYCIN HCL 300 MG PO CAPS
300.0000 mg | ORAL_CAPSULE | Freq: Three times a day (TID) | ORAL | 0 refills | Status: DC
Start: 2016-04-29 — End: 2016-05-13

## 2016-04-29 MED ORDER — ONDANSETRON HCL 4 MG/2ML IJ SOLN
4.0000 mg | Freq: Once | INTRAMUSCULAR | Status: AC
Start: 2016-04-29 — End: 2016-04-29
  Administered 2016-04-29: 4 mg via INTRAVENOUS
  Filled 2016-04-29: qty 2

## 2016-04-29 MED ORDER — MORPHINE SULFATE 2 MG/ML IJ/IV SOLN (WRAP)
4.0000 mg | Freq: Once | Status: AC
Start: 2016-04-29 — End: 2016-04-29
  Administered 2016-04-29: 4 mg via INTRAVENOUS
  Filled 2016-04-29: qty 2

## 2016-04-29 NOTE — ED Notes (Signed)
Pt medicated before discharged. Pt AAO x 4 rr even and unlabored. Pt ambulatory out in ER NAD

## 2016-04-29 NOTE — ED Triage Notes (Signed)
Gregory [name] is a 70 y.o. male came in ambualtory complaining of right hand pain onset 2 weeks ago. Pt states ws seen here last Sunday for same and was given pain medicine and antibiotics but did not complete his antibiotics. Pt states getting worse and is now, red and swollen. Denies injury or trauma.

## 2016-04-29 NOTE — ED Provider Notes (Addendum)
Physician/Midlevel provider first contact with patient: 04/29/16 0217         History     Chief Complaint   Patient presents with   . Hand Pain     Seen last week for right hand pain swelling redness - he stopped keflex shortly after visit on his own. Things got better with the hand -   After he stopped the keflex, he noted in past three days increasing redness and swelling and pain in right hand no trauma, no numbness - no fever/chills            The history is provided by the patient.   Hand Pain   This is a recurrent problem. The current episode started in the past 7 days. The problem occurs constantly. The problem has been gradually worsening. Nothing aggravates the symptoms. Treatments tried: Keflex But he stopped the medicine for no particular reason. The treatment provided no relief.            Past Medical History:   Diagnosis Date   . Amyloidosis    . Arthritis     bilat shoulders, lt knee   . Asthma without status asthmaticus     childhood   . CHF (congestive heart failure)    . Fracture of unspecified bones     1974 lt shoulder   . Gout    . Malignant neoplasm        Past Surgical History:   Procedure Laterality Date   . ARTHROPLASTY, SHOULDER, TOTAL  06/18/2013    Procedure: ARTHROPLASTY, SHOULDER, TOTAL;  Surgeon: Wendi Snipes, MD;  Location: [facility];  Service: Orthopedics;  Laterality: Left;   . COLONOSCOPY     . EXCISION, GANGLION  12/19/2012    Procedure: EXCISION, GANGLION;  Surgeon: Jillyn Hidden, MD;  Location: [facility];  Service: Orthopedics;  Laterality: Right;  injection right and left shoulder   . HERNIA REPAIR      lt inguinal age 36   . INJECTION, MEDICATION  12/19/2012    Procedure: INJECTION, MEDICATION;  Surgeon: Jillyn Hidden, MD;  Location: [facility];  Service: Orthopedics;  Laterality: Bilateral;       Family History   Problem Relation Age of Onset   . Family history unknown: Yes       Social  Social History   Substance Use Topics   . Smoking status: Never  Smoker   . Smokeless tobacco: Never Used   . Alcohol use Yes      Comment: rare       .     No Known Allergies    Home Medications             aspirin EC 81 MG EC tablet     Take 81 mg by mouth daily.     bumetanide (BUMEX) 2 MG tablet     Take 6 mg by mouth 2 (two) times daily.     cephalexin (KEFLEX) 500 MG capsule (Expired)     Take 1 capsule (500 mg total) by mouth 3 (three) times daily.for 7 days     dexamethasone (DECADRON) 4 MG tablet     Take 5 tablets (20 mg total) by mouth See Admin Instructions.Take 20 mg of Dexamethasone once a week     fluticasone (FLONASE) 50 MCG/ACT nasal spray     2 sprays by Nasal route daily.     Lenalidomide (REVLIMID) 5 MG Cap  Take 5 mg by mouth every other day.Days 1-21. Hold for 7 days. For Amyloidosis     Lenalidomide (REVLIMID) 5 MG Cap     Take by mouth.     metOLazone (ZAROXOLYN) 5 MG tablet     Take 5 mg by mouth as needed (PRN Sun, Tues Thus if weight >135).     metOLazone (ZAROXOLYN) 5 MG tablet     Take 5 mg by mouth daily.     oxyCODONE (ROXICODONE) 5 MG immediate release tablet     Take 1-2 tablets (5-10 mg total) by mouth every 4 (four) hours as needed for Pain.1-2 tablets every 4-6 hours as needed for pain     potassium chloride (K-TAB,KLOR-CON) 10 MEQ tablet     Take 10 mEq by mouth 2 (two) times daily.     prochlorperazine (COMPAZINE) 10 MG tablet     Take 10 mg by mouth every 6 (six) hours as needed (Nausea).           Review of Systems   All other systems reviewed and are negative.      Physical Exam    BP: 108/75, Heart Rate: (!) 105, Temp: 97.9 F (36.6 C), Resp Rate: 18, SpO2: 98 %, Weight: 63.5 kg    Physical Exam   Constitutional: He is oriented to person, place, and time. He appears well-developed and well-nourished.   HENT:   Head: Normocephalic and atraumatic.   Right Ear: External ear normal.   Left Ear: External ear normal.   Nose: Nose normal.   Mouth/Throat: Oropharynx is clear and moist. No oropharyngeal exudate.   Eyes: Conjunctivae and EOM  are normal. Pupils are equal, round, and reactive to light. Right eye exhibits no discharge.   Neck: Normal range of motion. No JVD present. No tracheal deviation present.   Cardiovascular: Normal rate, regular rhythm, normal heart sounds and intact distal pulses.  Exam reveals no gallop and no friction rub.    No murmur heard.  Pulmonary/Chest: Effort normal and breath sounds normal. No respiratory distress. He has no wheezes. He has no rales. He exhibits no tenderness.   Abdominal: Soft. Bowel sounds are normal. He exhibits no distension and no mass. There is no tenderness. No hernia.   Musculoskeletal: He exhibits edema and tenderness.   Right hand dorsum erythema and edema and tenderness NV intact pain on flexion no streaking   Neurological: He is alert and oriented to person, place, and time.   Skin: Capillary refill takes less than 2 seconds. He is not diaphoretic. There is erythema.   Psychiatric: He has a normal mood and affect.   Nursing note and vitals reviewed.        MDM and ED Course     ED Medication Orders     Start Ordered     Status Ordering Provider    04/29/16 0415 04/29/16 0414  clindamycin (CLEOCIN) capsule 300 mg  Once     Route: Oral  Ordered Dose: 300 mg     Last MAR action:  Given Valda Christenson ALAN    04/29/16 0408 04/29/16 0407  oxyCODONE-acetaminophen (PERCOCET) 5-325 MG per tablet 1 tablet  Once     Route: Oral  Ordered Dose: 1 tablet     Last MAR action:  Given Jaki Steptoe ALAN    04/29/16 0238 04/29/16 0237  cefTRIAXone (ROCEPHIN) 1 g IVPB 50 mL (premix) 1 g  Once     Route: Intravenous  Ordered Dose: 1 g  Last MAR action:  Stopped Ned Card    04/29/16 0238 04/29/16 0237  morphine injection 4 mg  Once     Route: Intravenous  Ordered Dose: 4 mg     Last MAR action:  Given Abdulhadi Stopa ALAN    04/29/16 0238 04/29/16 0237  ondansetron (ZOFRAN) injection 4 mg  Once     Route: Intravenous  Ordered Dose: 4 mg     Last MAR action:  Given Aashrith Eves, [place]             MDM  Number of Diagnoses or Management Options     Amount and/or Complexity of Data Reviewed  Clinical lab tests: ordered  Tests in the radiology section of CPT: ordered    Risk of Complications, Morbidity, and/or Mortality  Presenting problems: moderate  Diagnostic procedures: moderate  Management options: moderate    Patient Progress  Patient progress: stable        ED Course              Procedures    Clinical Impression & Disposition     Clinical Impression  Final diagnoses:   Cellulitis of right hand        ED Disposition     ED Disposition Condition Date/Time Comment    Discharge  Mon Apr 29, 2016  4:08 AM [name] discharge to home/self care.    Condition at disposition: Stable           Discharge Medication List as of 04/29/2016  4:08 AM      START taking these medications    Details   clindamycin (CLEOCIN) 300 MG capsule Take 1 capsule (300 mg total) by mouth 3 (three) times daily.for 7 days, Starting Mon 04/29/2016, Until Mon 05/06/2016, Print      HYDROcodone-acetaminophen (NORCO) 5-325 MG per tablet Take 1 tablet by mouth every 6 (six) hours as needed for Pain.for up to 10 doses, Starting Mon 04/29/2016, Print                 Labs Reviewed   CBC AND DIFFERENTIAL - Abnormal; Notable for the following:        Result Value    Platelets 139 (*)     MCH 32.8 (*)     All other components within normal limits   COMPREHENSIVE METABOLIC PANEL - Abnormal; Notable for the following:     Glucose 118 (*)     BUN 94.0 (*)     Creatinine 3.4 (*)     Sodium 131 (*)     Chloride 91 (*)     AST (SGOT) 80 (*)     Alkaline Phosphatase 161 (*)     Bilirubin, Total 5.2 (*)     Anion Gap 18.0 (*)     All other components within normal limits   C-REACTIVE PROTEIN - Abnormal; Notable for the following:     C-Reactive Protein 6.8 (*)     All other components within normal limits   SEDIMENTATION RATE - Abnormal; Notable for the following:     Sed Rate 25 (*)     All other components within normal limits    URIC ACID - Abnormal; Notable for the following:     Uric acid 13.3 (*)     All other components within normal limits   CULTURE BLOOD AEROBIC AND ANAEROBIC    Narrative:     ORDER#: [id number]  ORDERED BY: Nickola Lenig, CHRISTOP  SOURCE: Blood arm                                    COLLECTED:  04/29/16 03:20  ANTIBIOTICS AT COLL.:                                RECEIVED :  04/29/16 11:29  Culture Blood Aerobic and Anaerobic        PRELIM      05/01/16 12:21  04/30/16   No Growth after 1 day/s of incubation.  05/01/16   No Growth after 2 day/s of incubation.     CULTURE BLOOD AEROBIC AND ANAEROBIC    Narrative:     ORDER#: [id number]                                    ORDERED BY: Kohlton Gilpatrick, CHRISTOP  SOURCE: Blood arm                                    COLLECTED:  04/29/16 02:55  ANTIBIOTICS AT COLL.:                                RECEIVED :  04/29/16 11:29  Culture Blood Aerobic and Anaerobic        PRELIM      05/01/16 12:21  04/30/16   No Growth after 1 day/s of incubation.  05/01/16   No Growth after 2 day/s of incubation.     ETHANOL   GFR       Hand Right PA Lateral and Oblique   Final Result      No acute osseous process.      Guy Sandifer, MD    04/29/2016 3:40 AM                    Tiburcio Bash Antionette Poles, MD  04/29/16 0236       Ned Card, MD  05/02/16 [phone]

## 2016-04-29 NOTE — Discharge Instructions (Signed)
Cellulitis    You were diagnosed with cellulitis.    This is a bacterial infection of the skin. Symptoms are usually redness, swelling, and warmth in the affected area. Some people get a fever (temperature higher than 100.48F / 38C) with this infection.    Keep the extremity (arm or leg) above your heart level if possible.    Cellulitis is treated with antibiotics. It is also treated by keeping the affected area elevated (up). Sometimes, antibiotics are given intravenously ("IV"). Other infections can be treated with oral (by mouth) medicines.    Redness, swelling, warmth, and fever should start to get better after 2-3 days of treatment. Come back here or go to the nearest Emergency Department or your primary doctor for a re-check as directed.    Return here or go to the nearest Emergency Department in 24 hours. This will be for another examination.    YOU SHOULD SEEK MEDICAL ATTENTION IMMEDIATELY, EITHER HERE OR AT THE NEAREST EMERGENCY DEPARTMENT, IF ANY OF THE FOLLOWING OCCURS:   Redness spreads even with treatment. You can mark the infection area with a pen. This will help watch for improvement or spreading.   Fever (temperature higher than 100.48F / 38C) doesn t go away or gets worse after 2-3 days of antibiotics.   Unusual or increasing pain in the infected area.   Lightheadedness.   Feeling sicker at any time or not getting better as expected.

## 2016-05-03 ENCOUNTER — Inpatient Hospital Stay
Admission: EM | Admit: 2016-05-03 | Discharge: 2016-05-13 | DRG: 286 | Disposition: A | Discharge status: Home Health | Payer: Medicare Other | Attending: Internal Medicine | Admitting: Internal Medicine

## 2016-05-03 ENCOUNTER — Inpatient Hospital Stay: Payer: Medicare Other | Admitting: Critical Care Medicine

## 2016-05-03 ENCOUNTER — Emergency Department: Payer: Medicare Other

## 2016-05-03 DIAGNOSIS — I43 Cardiomyopathy in diseases classified elsewhere: Secondary | ICD-10-CM | POA: Diagnosis present

## 2016-05-03 DIAGNOSIS — Z96612 Presence of left artificial shoulder joint: Secondary | ICD-10-CM | POA: Diagnosis present

## 2016-05-03 DIAGNOSIS — R945 Abnormal results of liver function studies: Secondary | ICD-10-CM | POA: Diagnosis present

## 2016-05-03 DIAGNOSIS — M19011 Primary osteoarthritis, right shoulder: Secondary | ICD-10-CM | POA: Diagnosis present

## 2016-05-03 DIAGNOSIS — E875 Hyperkalemia: Secondary | ICD-10-CM | POA: Diagnosis present

## 2016-05-03 DIAGNOSIS — I5082 Biventricular heart failure: Secondary | ICD-10-CM | POA: Diagnosis present

## 2016-05-03 DIAGNOSIS — N183 Chronic kidney disease, stage 3 unspecified: Secondary | ICD-10-CM | POA: Diagnosis present

## 2016-05-03 DIAGNOSIS — I132 Hypertensive heart and chronic kidney disease with heart failure and with stage 5 chronic kidney disease, or end stage renal disease: Principal | ICD-10-CM | POA: Diagnosis present

## 2016-05-03 DIAGNOSIS — M19012 Primary osteoarthritis, left shoulder: Secondary | ICD-10-CM | POA: Diagnosis present

## 2016-05-03 DIAGNOSIS — N185 Chronic kidney disease, stage 5: Secondary | ICD-10-CM | POA: Diagnosis present

## 2016-05-03 DIAGNOSIS — E876 Hypokalemia: Secondary | ICD-10-CM | POA: Diagnosis not present

## 2016-05-03 DIAGNOSIS — E8581 Light chain (AL) amyloidosis: Secondary | ICD-10-CM | POA: Diagnosis present

## 2016-05-03 DIAGNOSIS — I248 Other forms of acute ischemic heart disease: Secondary | ICD-10-CM | POA: Diagnosis present

## 2016-05-03 DIAGNOSIS — Z8679 Personal history of other diseases of the circulatory system: Secondary | ICD-10-CM

## 2016-05-03 DIAGNOSIS — E859 Amyloidosis, unspecified: Secondary | ICD-10-CM

## 2016-05-03 DIAGNOSIS — N179 Acute kidney failure, unspecified: Secondary | ICD-10-CM | POA: Diagnosis present

## 2016-05-03 DIAGNOSIS — I509 Heart failure, unspecified: Secondary | ICD-10-CM

## 2016-05-03 DIAGNOSIS — N289 Disorder of kidney and ureter, unspecified: Secondary | ICD-10-CM

## 2016-05-03 DIAGNOSIS — R778 Other specified abnormalities of plasma proteins: Secondary | ICD-10-CM | POA: Diagnosis present

## 2016-05-03 DIAGNOSIS — E854 Organ-limited amyloidosis: Secondary | ICD-10-CM | POA: Diagnosis present

## 2016-05-03 DIAGNOSIS — M1712 Unilateral primary osteoarthritis, left knee: Secondary | ICD-10-CM | POA: Diagnosis present

## 2016-05-03 DIAGNOSIS — J45909 Unspecified asthma, uncomplicated: Secondary | ICD-10-CM | POA: Diagnosis present

## 2016-05-03 DIAGNOSIS — I959 Hypotension, unspecified: Secondary | ICD-10-CM | POA: Diagnosis not present

## 2016-05-03 DIAGNOSIS — I2510 Atherosclerotic heart disease of native coronary artery without angina pectoris: Secondary | ICD-10-CM | POA: Diagnosis present

## 2016-05-03 DIAGNOSIS — K761 Chronic passive congestion of liver: Secondary | ICD-10-CM | POA: Diagnosis present

## 2016-05-03 DIAGNOSIS — M109 Gout, unspecified: Secondary | ICD-10-CM | POA: Diagnosis present

## 2016-05-03 DIAGNOSIS — D696 Thrombocytopenia, unspecified: Secondary | ICD-10-CM | POA: Diagnosis present

## 2016-05-03 DIAGNOSIS — I5043 Acute on chronic combined systolic (congestive) and diastolic (congestive) heart failure: Secondary | ICD-10-CM | POA: Diagnosis present

## 2016-05-03 DIAGNOSIS — E871 Hypo-osmolality and hyponatremia: Secondary | ICD-10-CM | POA: Diagnosis present

## 2016-05-03 DIAGNOSIS — R7989 Other specified abnormal findings of blood chemistry: Secondary | ICD-10-CM

## 2016-05-03 HISTORY — DX: Organ-limited amyloidosis: E85.4

## 2016-05-03 LAB — CBC AND DIFFERENTIAL
Absolute NRBC: 0 10*3/uL
Basophils Absolute Automated: 0.02 10*3/uL (ref 0.00–0.20)
Basophils Automated: 0.4 %
Eosinophils Absolute Automated: 0.01 10*3/uL (ref 0.00–0.70)
Eosinophils Automated: 0.2 %
Hematocrit: 45.2 % (ref 42.0–52.0)
Hgb: 15.4 g/dL (ref 13.0–17.0)
Immature Granulocytes Absolute: 0.02 10*3/uL
Immature Granulocytes: 0.4 %
Lymphocytes Absolute Automated: 0.41 10*3/uL — ABNORMAL LOW (ref 0.50–4.40)
Lymphocytes Automated: 7.3 %
MCH: 32.7 pg — ABNORMAL HIGH (ref 28.0–32.0)
MCHC: 34.1 g/dL (ref 32.0–36.0)
MCV: 96 fL (ref 80.0–100.0)
MPV: 11 fL (ref 9.4–12.3)
Monocytes Absolute Automated: 0.46 10*3/uL (ref 0.00–1.20)
Monocytes: 8.2 %
Neutrophils Absolute: 4.68 10*3/uL (ref 1.80–8.10)
Neutrophils: 83.5 %
Nucleated RBC: 0 /100 WBC (ref 0.0–1.0)
Platelets: 136 10*3/uL — ABNORMAL LOW (ref 140–400)
RBC: 4.71 10*6/uL (ref 4.70–6.00)
RDW: 14 % (ref 12–15)
WBC: 5.6 10*3/uL (ref 3.50–10.80)

## 2016-05-03 LAB — BASIC METABOLIC PANEL
Anion Gap: 18 — ABNORMAL HIGH (ref 5.0–15.0)
BUN: 127 mg/dL — ABNORMAL HIGH (ref 9.0–28.0)
CO2: 26 mEq/L (ref 22–29)
Calcium: 10.7 mg/dL — ABNORMAL HIGH (ref 8.5–10.5)
Chloride: 90 mEq/L — ABNORMAL LOW (ref 100–111)
Creatinine: 4.7 mg/dL — ABNORMAL HIGH (ref 0.7–1.3)
Glucose: 111 mg/dL — ABNORMAL HIGH (ref 70–100)
Potassium: 4.9 mEq/L (ref 3.5–5.1)
Sodium: 134 mEq/L — ABNORMAL LOW (ref 136–145)

## 2016-05-03 LAB — COMPREHENSIVE METABOLIC PANEL
ALT: 38 U/L (ref 0–55)
AST (SGOT): 67 U/L — ABNORMAL HIGH (ref 5–34)
Albumin/Globulin Ratio: 0.9 (ref 0.9–2.2)
Albumin: 3.3 g/dL — ABNORMAL LOW (ref 3.5–5.0)
Alkaline Phosphatase: 155 U/L — ABNORMAL HIGH (ref 38–106)
Anion Gap: 18 — ABNORMAL HIGH (ref 5.0–15.0)
BUN: 130 mg/dL — ABNORMAL HIGH (ref 9.0–28.0)
Bilirubin, Total: 3.5 mg/dL — ABNORMAL HIGH (ref 0.2–1.2)
CO2: 27 mEq/L (ref 22–29)
Calcium: 10 mg/dL (ref 8.5–10.5)
Chloride: 89 mEq/L — ABNORMAL LOW (ref 100–111)
Creatinine: 5.1 mg/dL — ABNORMAL HIGH (ref 0.7–1.3)
Globulin: 3.8 g/dL — ABNORMAL HIGH (ref 2.0–3.6)
Glucose: 90 mg/dL (ref 70–100)
Potassium: 5.6 mEq/L — ABNORMAL HIGH (ref 3.5–5.1)
Protein, Total: 7.1 g/dL (ref 6.0–8.3)
Sodium: 134 mEq/L — ABNORMAL LOW (ref 136–145)

## 2016-05-03 LAB — B-TYPE NATRIURETIC PEPTIDE: B-Natriuretic Peptide: 6908 pg/mL — ABNORMAL HIGH (ref 0–100)

## 2016-05-03 LAB — HEMOLYSIS INDEX
Hemolysis Index: 7 (ref 0–18)
Hemolysis Index: 97 — ABNORMAL HIGH (ref 0–18)

## 2016-05-03 LAB — GFR
EGFR: 13.7
EGFR: 15

## 2016-05-03 LAB — TROPONIN I
Troponin I: 1.53 ng/mL (ref 0.00–0.09)
Troponin I: 1.28 ng/mL (ref 0.00–0.09)

## 2016-05-03 LAB — BILIRUBIN, DIRECT: Bilirubin Direct: 1.9 mg/dL — ABNORMAL HIGH (ref 0.0–0.5)

## 2016-05-03 MED ORDER — SODIUM POLYSTYRENE SULFONATE 15 GM/60ML PO SUSP
15.0000 g | Freq: Once | ORAL | Status: AC
Start: 2016-05-03 — End: 2016-05-03
  Administered 2016-05-03: 15 g via ORAL
  Filled 2016-05-03: qty 60

## 2016-05-03 MED ORDER — SODIUM CHLORIDE 0.9 % IV SOLN
INTRAVENOUS | Status: AC
Start: 2016-05-03 — End: 2016-05-03
  Administered 2016-05-03: 100 mL via INTRAVENOUS
  Filled 2016-05-03: qty 250

## 2016-05-03 MED ORDER — PROCHLORPERAZINE EDISYLATE 5 MG/ML IJ SOLN
10.0000 mg | INTRAMUSCULAR | Status: DC | PRN
Start: 2016-05-03 — End: 2016-05-13

## 2016-05-03 MED ORDER — SODIUM CHLORIDE 0.9 % IV BOLUS
250.0000 mL | Freq: Once | INTRAVENOUS | Status: AC
Start: 2016-05-03 — End: 2016-05-03

## 2016-05-03 MED ORDER — DEXTROSE 10 % IV BOLUS
250.0000 mL | Freq: Once | INTRAVENOUS | Status: AC
Start: 2016-05-03 — End: 2016-05-03
  Administered 2016-05-03: 250 mL via INTRAVENOUS
  Filled 2016-05-03: qty 250

## 2016-05-03 MED ORDER — FLUTICASONE PROPIONATE 50 MCG/ACT NA SUSP
2.0000 | Freq: Every day | NASAL | Status: DC
Start: 2016-05-04 — End: 2016-05-13
  Administered 2016-05-04 – 2016-05-13 (×9): 2 via NASAL
  Filled 2016-05-03 (×3): qty 16

## 2016-05-03 MED ORDER — INSULIN REGULAR HUMAN 100 UNIT/ML IJ SOLN
3.0000 [IU] | Freq: Once | INTRAMUSCULAR | Status: AC
Start: 2016-05-03 — End: 2016-05-03
  Administered 2016-05-03: 3 [IU] via INTRAVENOUS
  Filled 2016-05-03: qty 9

## 2016-05-03 MED ORDER — PREDNISONE 20 MG PO TABS
20.0000 mg | ORAL_TABLET | Freq: Every day | ORAL | Status: DC
Start: 2016-05-04 — End: 2016-05-13
  Administered 2016-05-04 – 2016-05-13 (×10): 20 mg via ORAL
  Filled 2016-05-03 (×8): qty 1
  Filled 2016-05-03 (×2): qty 2

## 2016-05-03 MED ORDER — ACETAMINOPHEN 325 MG PO TABS
650.0000 mg | ORAL_TABLET | Freq: Four times a day (QID) | ORAL | Status: DC | PRN
Start: 2016-05-03 — End: 2016-05-13
  Administered 2016-05-08: 650 mg via ORAL
  Filled 2016-05-03: qty 2

## 2016-05-03 MED ORDER — CALCIUM CHLORIDE 10 % IV SOLN
1.0000 g | Freq: Once | INTRAVENOUS | Status: AC
Start: 2016-05-03 — End: 2016-05-03
  Administered 2016-05-03: 1 g via INTRAVENOUS
  Filled 2016-05-03: qty 10

## 2016-05-03 MED ORDER — HEPARIN SODIUM (PORCINE) 5000 UNIT/ML IJ SOLN
5000.0000 [IU] | Freq: Two times a day (BID) | INTRAMUSCULAR | Status: DC
Start: 2016-05-03 — End: 2016-05-13
  Administered 2016-05-03 – 2016-05-13 (×19): 5000 [IU] via SUBCUTANEOUS
  Filled 2016-05-03 (×20): qty 1

## 2016-05-03 MED ORDER — ASPIRIN 81 MG PO TBEC
81.0000 mg | DELAYED_RELEASE_TABLET | Freq: Every day | ORAL | Status: DC
Start: 2016-05-04 — End: 2016-05-13
  Administered 2016-05-04 – 2016-05-13 (×10): 81 mg via ORAL
  Filled 2016-05-03 (×10): qty 1

## 2016-05-03 NOTE — ED Triage Notes (Signed)
Patient presents to the ED after evaluation at [facility] by his oncologist. Patients wife states he needs evaluation of elevated potassium level and abnormal kidney function test.

## 2016-05-03 NOTE — H&P (Signed)
ADMISSION HISTORY AND PHYSICAL EXAM    Date Time: 05/03/16 9:38 PM  Patient Name: Gregory [name]  Attending Physician: Warren Lacy, *    Assessment:   Hyperkalemia  AKI  CKD stage 4  Amyloidosis of heart    Plan:   Admit to ICU, monitor chemistry closely  Has received kayexalate and insulin dextrose calcium  Iqbal consult for nephrology  Zaheer consult for cardiology  Usually takes revlimid 65m every other day, recently discussion with oncologist for 2.5 mg daily.  Hold diuretics for now, recent [facility]ain epic negative for DVT. Check troponins, continue aspirin.       History:   Gregory [name] a 70y.o. male who presents to the hospital on 05/03/2016  With past medical history of gout, having a flare in his hand. He saw his new PCP 05/02/16 and was placed on keflex with prednisone.  Today he was scheduled to see his oncologist. He had routine blood work done and then was called to come in due to hyperkalemia.  Patient has creatinine of 5 and BUN over 100 .   He was seen in the Ed on  11/5 and then again on 04/29/16 with his hand pain. He was recently discharged on 04/18/16 from hospital after being treated for acute decompensated heart failure.  Patient has history of amyloidosis, CHF with EF 25-30%, and CKD stage 4, HTN, asthma.  [facility] this [facility] same. His BNP in most recent hospitalization went from 17, 000 to 4, 000 with bumex and zaroxalyn.   His baseline creatinine appears to be 2.9. His oncologist follows him for amyloidosis of his heart.  His blood pressure is marginally low 90s/60s    Past Medical History:     Past Medical History:   Diagnosis Date   . Arthritis     bilat shoulders, lt knee   . Asthma without status asthmaticus     childhood   . Cardiac amyloidosis    . CHF (congestive heart failure)    . Fracture of unspecified bones     1974 lt shoulder   . Gout    . Malignant neoplasm      Amyloid heart disease,   CKD 4    Past Surgical History:     Past  Surgical History:   Procedure Laterality Date   . ARTHROPLASTY, SHOULDER, TOTAL  06/18/2013    Procedure: ARTHROPLASTY, SHOULDER, TOTAL;  Surgeon: NWendi Snipes MD;  Location: [facility];  Service: Orthopedics;  Laterality: Left;   . COLONOSCOPY     . EXCISION, GANGLION  12/19/2012    Procedure: EXCISION, GANGLION;  Surgeon: MJillyn Hidden MD;  Location: [facility];  Service: Orthopedics;  Laterality: Right;  injection right and left shoulder   . HERNIA REPAIR      lt inguinal age 70  . INJECTION, MEDICATION  12/19/2012    Procedure: INJECTION, MEDICATION;  Surgeon: MJillyn Hidden MD;  Location: [facility];  Service: Orthopedics;  Laterality: Bilateral;       Family History:     Family History   Problem Relation Age of Onset   . Family history unknown: Yes       Social History:     Social History     Social History   . Marital status: Married     Spouse name: N/A   . Number of children: N/A   . Years of  education: N/A     Social History Main Topics   . Smoking status: Never Smoker   . Smokeless tobacco: Never Used   . Alcohol use Yes      Comment: rare   . Drug use: No   . Sexual activity: No     Other Topics Concern   . Not on file     Social History Narrative   . No narrative on file       Allergies:   No Known Allergies    Medications:       Review of Systems:   A comprehensive review of systems was: History obtained from the patient  General ROS: negative  ENT ROS: positive for - hearing change and nasal congestion  negative for - epistaxis, headaches, nasal discharge, sinus pain, sore throat or visual changes  Respiratory ROS: no cough, shortness of breath, or wheezing  Cardiovascular ROS: no chest pain or dyspnea on exertion, BLE +3 edema into thighs. States that is about where they have been, left foot pain  Gastrointestinal ROS: no abdominal pain, change in bowel habits, or black or bloody stools  Genito-Urinary ROS: no dysuria, trouble voiding, or hematuria  states he has urinated a lot  today  Musculoskeletal ROS: right hand edema improving, gout flare  Neurological ROS: no TIA or stroke symptoms  Dermatological ROS: negative    Physical Exam:     VITAL SIGNS   Temp:  [95.9 F (35.5 C)-97.5 F (36.4 C)] 97.5 F (36.4 C)  Heart Rate:  [85-89] 85  Resp Rate:  [16-22] 16  BP: (101-105)/(67-71) 105/71  Blood Glucose:  Pulse ox:  Telemetry:sinus     No intake or output data in the 24 hours ending 05/03/16 2138   Capillary refill = < 3 seconds  General Appearance:  alert, well appearing, and in no distress  Mental status:  alert, oriented to person, place, and time  Neuro:  alert, oriented, normal speech, no focal findings or movement disorder noted  Neck:supple, no significant adenopathy  Lungs: clear to auscultation, no wheezes, rales or rhonchi, symmetric air entry  Cardiac: normal rate, regular rhythm, normal S1, S2, no murmurs, rubs, clicks or gallops  Abdomen:  soft, nontender, nondistended, no masses or organomegaly  Extremities: pedal edema 3 +   Skin:   normal coloration and turgor, no rashes, no suspicious skin lesions noted  Dry skin, brisk cap refill  GU: clear amber urine  Data:       IBW/kg (Calculated) : 61.5    Vent Settings:    IBW:      Patient Lines/Drains/Airways Status    Active PICC Line / CVC Line / PIV Line / Drain / Airway / Intraosseous Line / Epidural Line / ART Line / Line / Wound / Pressure Ulcer / NG/OG Tube     Name:   Placement date:   Placement time:   Site:   Days:    Peripheral IV 05/03/16 Left Antecubital  05/03/16    1848    Antecubital    less than 1                Labs:       Labs (last 72 hours):    Recent Labs  Lab 05/03/16  1850 04/29/16  0255   WBC 5.60 5.86   Hgb 15.4 15.4   Hematocrit 45.2 43.9   Platelets 136* 139*     No results for input(s): PT, INR, PTT in the last  72 hours.      Recent Labs      05/03/16   1850   Sodium  134*   Potassium  5.6*   Chloride  89*   CO2  27   BUN  130.0*   Creatinine  5.1*   Glucose  90   Calcium  10.0       Recent  Labs  Lab 05/03/16  1850   Troponin I 1.53*     .              Rads:   Radiological Procedure reviewed.     I have personally reviewed the patient's history and 24 hour interval events, along with vitals, labs, radiology images.    So far today I have spent 35 minutes providing care for this patient excluding teaching and billable procedures, and not overlapping with any other providers.:    Signed by: Silas Flood, ACNP-BC  Date/Time: 05/03/16 9:38 PM

## 2016-05-03 NOTE — ED Provider Notes (Signed)
EMERGENCY DEPARTMENT HISTORY AND PHYSICAL EXAM     Physician/Midlevel provider first contact with patient: 05/03/16 1855         Date: 05/03/2016  Patient Name: Gregory [name]    History of Presenting Illness     Chief Complaint   Patient presents with   . Abnormal Lab     elevated potassium       History Provided By: Patient, family    Chief Complaint: abnormal lab value  Duration: prior to arrival  Timing: constant  Location: hematologic  Quality: elevated potassium level, kidney dysfunction  Severity: moderate  Exacerbating factors: none   Alleviating factors: none  Associated Symptoms: R hand swelling, cough  Pertinent Negatives: N/V    Additional History: Gregory [name] is a 70 y.o. male with past medical history of cancer, gout presenting to the ED with abnormal lab value prior to arrival. Patient had routine blood w/u at [facility] after being seen by oncologist earlier today. He was referred to ED for elevated potassium level. Family reports cough symptoms.         PCP: [name], [name], MD  SPECIALISTS:  Nephrology - at [facility]        No current facility-administered medications for this encounter.      Current Outpatient Prescriptions   Medication Sig Dispense Refill   . methacholine (PROVOCHOLINE) 100 MG inhaler solution Inhale into the lungs once.     . potassium chloride (KLOR-CON) 20 MEQ packet Take 20 mEq by mouth.     . predniSONE (DELTASONE) 20 MG tablet Take 20 mg by mouth daily.     [name] aspirin EC 81 MG EC tablet Take 81 mg by mouth daily.     . bumetanide (BUMEX) 2 MG tablet Take 6 mg by mouth 2 (two) times daily.     . cephalexin (KEFLEX) 500 MG capsule Take 1 capsule (500 mg total) by mouth 2 (two) times daily.for 7 days 21 capsule 0   . clindamycin (CLEOCIN) 300 MG capsule Take 1 capsule (300 mg total) by mouth 3 (three) times daily.for 7 days 28 capsule 0   . dexamethasone (DECADRON) 4 MG tablet Take 5 tablets (20 mg total) by mouth See Admin Instructions.Take 20 mg of  Dexamethasone once a week     . fluticasone (FLONASE) 50 MCG/ACT nasal spray 2 sprays by Nasal route daily.     [name] HYDROcodone-acetaminophen (NORCO) 5-325 MG per tablet Take 1 tablet by mouth every 6 (six) hours as needed for Pain.for up to 10 doses 10 tablet 0   . Lenalidomide (REVLIMID) 5 MG Cap Take 5 mg by mouth every other day.Days 1-21. Hold for 7 days. For Amyloidosis     . Lenalidomide (REVLIMID) 5 MG Cap Take by mouth.     . metOLazone (ZAROXOLYN) 5 MG tablet Take 5 mg by mouth as needed (PRN Sun, Tues Thus if weight >135).     . metOLazone (ZAROXOLYN) 5 MG tablet Take 5 mg by mouth daily.     [name] oxyCODONE (ROXICODONE) 5 MG immediate release tablet Take 1-2 tablets (5-10 mg total) by mouth every 4 (four) hours as needed for Pain.1-2 tablets every 4-6 hours as needed for pain 15 tablet 0   . potassium chloride (K-TAB,KLOR-CON) 10 MEQ tablet Take 10 mEq by mouth 2 (two) times daily.     . prochlorperazine (COMPAZINE) 10 MG tablet Take 10 mg by mouth every 6 (six) hours as needed (Nausea).  Past History     Past Medical History:  Past Medical History:   Diagnosis Date   . Arthritis     bilat shoulders, lt knee   . Asthma without status asthmaticus     childhood   . Cardiac amyloidosis    . CHF (congestive heart failure)    . Fracture of unspecified bones     1974 lt shoulder   . Gout    . Malignant neoplasm        Past Surgical History:  Past Surgical History:   Procedure Laterality Date   . ARTHROPLASTY, SHOULDER, TOTAL  06/18/2013    Procedure: ARTHROPLASTY, SHOULDER, TOTAL;  Surgeon: Wendi Snipes, MD;  Location: [facility];  Service: Orthopedics;  Laterality: Left;   . COLONOSCOPY     . EXCISION, GANGLION  12/19/2012    Procedure: EXCISION, GANGLION;  Surgeon: Jillyn Hidden, MD;  Location: [facility];  Service: Orthopedics;  Laterality: Right;  injection right and left shoulder   . HERNIA REPAIR      lt inguinal age 18   . INJECTION, MEDICATION  12/19/2012    Procedure: INJECTION, MEDICATION;   Surgeon: Jillyn Hidden, MD;  Location: [facility];  Service: Orthopedics;  Laterality: Bilateral;       Family History:  Family History   Problem Relation Age of Onset   . Family history unknown: Yes       Social History:  Social History   Substance Use Topics   . Smoking status: Never Smoker   . Smokeless tobacco: Never Used   . Alcohol use Yes      Comment: rare       Allergies:  No Known Allergies    Review of Systems     Review of Systems   Constitutional: Negative for fever.   Respiratory: Positive for cough.    Gastrointestinal: Negative for nausea and vomiting.   Musculoskeletal: Positive for joint swelling.   All other systems reviewed and are negative.        Physical Exam   BP 105/71   Pulse 85   Temp 97.5 F (36.4 C) (Oral)   Resp 16   Ht _0  (1.651 m)   Wt 63.5 kg   SpO2 98%   BMI 23.30 kg/m     Physical Exam   Constitutional: Patient is alert.  Well nourished.  NAD  Head: Atraumatic.   Eyes: EOMI. PERRL  ENT:  MMM.   Neck:  FROM. No spinal tenderness. Neck supple.  +JVD   Cardiovascular: Normal rate and regular rhythm.   Pulmonary/Chest: Effort normal and breath sounds normal. No respiratory distress.   Abdominal: Soft. There is no tenderness. Bowel sounds present and normal.    Musculoskeletal:  Bilateral pitting edema to knees.   Neurological: Patient is alert and oriented to person, place, and time.  No focal deficits.   Skin: Skin is warm and dry.    Diagnostic Study Results     Labs -     Results     Procedure Component Value Units Date/Time    Troponin I [[id number]]  (Abnormal) Collected:  05/03/16 1850    Specimen:  Blood Updated:  05/03/16 2052     Troponin I 1.53 (HH) ng/mL     B-type Natriuretic Peptide (BNP) [[id number]]  (Abnormal) Collected:  05/03/16 1850    Specimen:  Blood Updated:  05/03/16 2045     B-Natriuretic Peptide 6,908 (H) pg/mL  Bilirubin, direct [[id number]]  (Abnormal) Collected:  05/03/16 1850     Updated:  05/03/16 1940     Bilirubin, Direct 1.9 (H)  mg/dL     Comprehensive Metabolic Panel (CMP) [[id number]]  (Abnormal) Collected:  05/03/16 1850    Specimen:  Blood Updated:  05/03/16 1928     Glucose 90 mg/dL      BUN 130.0 (H) mg/dL      Creatinine 5.1 (H) mg/dL      Sodium 134 (L) mEq/L      Potassium 5.6 (H) mEq/L      Chloride 89 (L) mEq/L      CO2 27 mEq/L      Calcium 10.0 mg/dL      Protein, Total 7.1 g/dL      Albumin 3.3 (L) g/dL      AST (SGOT) 67 (H) U/L      ALT 38 U/L      Alkaline Phosphatase 155 (H) U/L      Bilirubin, Total 3.5 (H) mg/dL      Globulin 3.8 (H) g/dL      Albumin/Globulin Ratio 0.9     Anion Gap 18.0 (H)    Hemolysis index [[id number]] Collected:  05/03/16 1850     Updated:  05/03/16 1928     Hemolysis Index 7    GFR [[id number]] Collected:  05/03/16 1850     Updated:  05/03/16 1928     EGFR 13.7    CBC and differential [[id number]]  (Abnormal) Collected:  05/03/16 1850    Specimen:  Blood from Blood Updated:  05/03/16 1908     WBC 5.60 x10 3/uL      Hgb 15.4 g/dL      Hematocrit 45.2 %      Platelets 136 (L) x10 3/uL      RBC 4.71 x10 6/uL      MCV 96.0 fL      MCH 32.7 (H) pg      MCHC 34.1 g/dL      RDW 14 %      MPV 11.0 fL      Neutrophils 83.5 %      Lymphocytes Automated 7.3 %      Monocytes 8.2 %      Eosinophils Automated 0.2 %      Basophils Automated 0.4 %      Immature Granulocyte 0.4 %      Nucleated RBC 0.0 /100 WBC      Neutrophils Absolute 4.68 x10 3/uL      Abs Lymph Automated 0.41 (L) x10 3/uL      Abs Mono Automated 0.46 x10 3/uL      Abs Eos Automated 0.01 x10 3/uL      Absolute Baso Automated 0.02 x10 3/uL      Absolute Immature Granulocyte 0.02 x10 3/uL      Absolute NRBC 0.00 x10 3/uL           Radiologic Studies -   Radiology Results (24 Hour)     Procedure Component Value Units Date/Time    Chest 2 Views [[id number]] Collected:  05/03/16 2039    Order Status:  Completed Updated:  05/03/16 2044    Narrative:       INDICATION: Cough.    TECHNIQUE: 2 views.    FINDINGS: No acute pulmonary infiltrates are  demonstrated.  The  cardiomediastinal silhouette is within normal limits.  The costophrenic  angles are clear. There is a prosthetic left humeral head.  Impression:        No acute pulmonary infiltrates are demonstrated.     Steward Drone, MD   05/03/2016 8:40 PM        .    Medical Decision Making   I am the first provider for this patient.    I reviewed the vital signs, available nursing notes, past medical history, past surgical history, family history and social history.    Vital Signs-Reviewed the patient's vital signs.     Patient Vitals for the past 12 hrs:   BP Temp Pulse Resp   05/03/16 2046 105/71 97.5 F (36.4 C) 85 16   05/03/16 1823 101/67 (!) 95.9 F (35.5 C) 89 22       Pulse Oximetry Analysis - Normal 98% on RA    Cardiac Monitor:  Rate: 89 bpm  Rhythm: NSR    EKG:  Interpreted by the EP.   Time Interpreted: 1833   Rate: 89 bpm   Rhythm: NSR    Interpretation: Low voltage. Poor R wave progression. Inferior Q waves.    Comparison: Compared to EKG from 04/17/16, rate has decreased.         Old Medical Records: Nursing notes.     ED Course:     1903. Recommended admission; patient and family with plan of care.     1945. D/w oncologist at [facility], who is aware of patient's case.     2017. D/w Dr. Emelda Brothers, nephrology, who agrees to consult.     2036. Patient is resting comfortably. I have discussed all testing results and plan of care. Patient and family agree with admission.     2110. D/w Dr. Laddie Aquas, cardiology, who agrees to consult.     2119. D/w Dr. Romana Juniper, [place] on call, requests for call to ICU.      2124. D/w Dr. Nadine Counts, ICU, who will evaluate in ED.     Provider Notes: Worsening kidney function from Oncology labs today at [facility].  BUN/Cr 130/5.1 from baseline ~70/3.0.  Potassium 5.6.  Given calcium, D50/insulin and kayex.  BNP 7K.  Trop 1.5 (previously 1.0-1.1 2 wk ago).  CXR clear.      For Hospitalized Patients:    1. Hospitalization Decision Time:  The decision to admit this patient  was made by the emergency provider at 2037 on 05/03/2016     CRITICAL CARE: The high probability of sudden, clinically significant deterioration in the patient's condition required the highest level of my preparedness to intervene urgently.    The services I provided to this patient were to treat and/or prevent clinically significant deterioration that could result in: mortality.  Services included the following: chart data review, reviewing nursing notes and/or old charts, documentation time, consultant collaboration regarding findings and treatment options, medication orders and management, direct patient care, re-evaluations, vital sign assessments and ordering, interpreting and reviewing diagnostic studies/lab tests.    Aggregate critical care time was 35 minutes, which includes only time during which I was engaged in work directly related to the patient's care, as described above, whether at the bedside or elsewhere in the Emergency Department.  It did not include time spent performing other reported procedures or the services of residents, students, nurses or physician assistants.    Diagnosis     Clinical Impression:   1. Hyperkalemia    2. Acute on chronic renal insufficiency    3. Elevated troponin    4. Elevated brain natriuretic peptide (BNP) level  5. Cardiac amyloidosis    6. History of CHF (congestive heart failure)        Treatment Plan:   ED Disposition     ED Disposition Condition Date/Time Comment    Admit  Fri May 03, 2016 10:03 PM Admitting Physician: Evon Slack [[id number]]   Diagnosis: Hyperkalemia [914782]   Estimated Length of Stay: > or = to 2 midnights   Tentative Discharge Plan?: Home or Self Care [1]   Patient Class: Inpatient [101]              _______________________________      Attestations: This note is prepared by Daniel Nones, acting as scribe for Dr. Dorris Carnes, MD.    Dr. Dorris Carnes, MD - The scribe's documentation has been prepared under my direction and personally  reviewed by me in its entirety.  I confirm that the note above accurately reflects all work, treatment, procedures, and medical decision making performed by me.    _______________________________       Warren Lacy, MD  05/03/16 [phone]

## 2016-05-03 NOTE — Progress Notes (Signed)
Transitional Care Management    CHF Management-Teachback--    Does the patient understand what could make their heart failure worse (diet, liquids, activity, weight, smoking, infections): Yes- salt, fluids, sickness, lack of exercise    Can the patient identify the signs and symptoms that CHF is worsening: Yes - HA, edema, wt. Gain, swelling    Can the patient identify what to do if he/she experiences these symptoms: Yes- Call PCP to report symptoms. Schedule an emergent appt. If needed      Wellness--    What is the patient's current/history of alcohol use/abuse: Rarely    What is the patient's current/history of tobacco use/abuse: None    Does the patient need/want information on assistance programs for alcohol abuse or smoking cessation?  No    How often/much does the patient exercise: Pt. Does not do any formal exercise, but is bothered by gout flares. Pt. Does walk, run errands    Has the patient been given exercise guidance from his/her doctor:  No: No Restrictions    Education on exercise provided: Yes- CM and pt. Discussed the benefit of exercise on the heart and lungs    Is the patient having any issues with sleeping well:  Yes - Pt. Uses a wedge pillow or recliner and sleeps well with these items    Has the patient discussed these issues with his/her doctor:  Yes      CHF Self management/Health Maintenance--    Has the patient's doctor recommended a special diet: Yes    Has the patient been instructed on low salt and/or low fat intake: Yes    If on low salt diet, can patient describe how much is 2 grams of sodium: Yes- reviewed with CM    Diet education completed: Yes- Pt. Stated he loves to cook and uses Mrs. Dash for seasoning, no salt added to foods. Pt. Mainly eats; steak, fish, chicken salad, [place], salmon, cabbage and vegetables.    Is the patient on fluid restriction: Yes    How many ounces of fluid can the patient have a day: 64oz    Has the patient been instructed on the following:   Influenza  vaccine Yes   Pneumococcal vaccine Yes   Health record Yes   Smoking cessation/reduction No    Hand hygiene Yes    Advance Directives--    Has the patient participated in an advance care planning process:  No       Has the patient completed an advance care planning document:  No      Do all interested parties have a copy of the advance care planning document:  No      Does the patient want further information on advance directives:  Yes- Spouse stated that pt. Does not have Advance Directives and agreed to receipt of 2 5 Wishes booklets for she and pt. To review. CM sent them out on 05/03/16.    Educational materials mailed to patient:  No        Call Summary Notes-- Spoke to pt. Who reported he was doing so-so, no CHF symptoms, but his hand has been acting up with a gout flare. Pt. Saw his new PCP on 05/02/16 and was put on Keflex and Prednisone. Pt. Sees his Oncologist on 05/03/16. Pt. Is mainly following a low sodium diet and also his fluid restriction. Pt. And CM discussed Advance Directives and pt. Agreed to receive 2 copies of the 5 Wishes document and will review them. Pt.  Had no other current concerns. CM will follow-up in 2 weeks for further CHF Management.    [name], [place], [place]  T [phone]  F [phone]

## 2016-05-04 ENCOUNTER — Inpatient Hospital Stay: Payer: Medicare Other

## 2016-05-04 DIAGNOSIS — N183 Chronic kidney disease, stage 3 unspecified: Secondary | ICD-10-CM | POA: Diagnosis present

## 2016-05-04 LAB — BASIC METABOLIC PANEL
Anion Gap: 20 — ABNORMAL HIGH (ref 5.0–15.0)
Anion Gap: 19 — ABNORMAL HIGH (ref 5.0–15.0)
BUN: 126 mg/dL — ABNORMAL HIGH (ref 9.0–28.0)
BUN: 123 mg/dL — ABNORMAL HIGH (ref 9.0–28.0)
CO2: 23 mEq/L (ref 22–29)
CO2: 27 mEq/L (ref 22–29)
Calcium: 10 mg/dL (ref 8.5–10.5)
Calcium: 9.6 mg/dL (ref 8.5–10.5)
Chloride: 92 mEq/L — ABNORMAL LOW (ref 100–111)
Chloride: 91 mEq/L — ABNORMAL LOW (ref 100–111)
Creatinine: 4.7 mg/dL — ABNORMAL HIGH (ref 0.7–1.3)
Creatinine: 4.2 mg/dL — ABNORMAL HIGH (ref 0.7–1.3)
Glucose: 98 mg/dL (ref 70–100)
Glucose: 103 mg/dL — ABNORMAL HIGH (ref 70–100)
Potassium: 4 mEq/L (ref 3.5–5.1)
Potassium: 3.6 mEq/L (ref 3.5–5.1)
Sodium: 135 mEq/L — ABNORMAL LOW (ref 136–145)
Sodium: 137 mEq/L (ref 136–145)

## 2016-05-04 LAB — TSH: TSH: 0.62 u[IU]/mL (ref 0.35–4.94)

## 2016-05-04 LAB — CBC
Absolute NRBC: 0 10*3/uL
Hematocrit: 45.3 % (ref 42.0–52.0)
Hgb: 15.4 g/dL (ref 13.0–17.0)
MCH: 32.2 pg — ABNORMAL HIGH (ref 28.0–32.0)
MCHC: 34 g/dL (ref 32.0–36.0)
MCV: 94.6 fL (ref 80.0–100.0)
MPV: 12 fL (ref 9.4–12.3)
Nucleated RBC: 0 /100 WBC (ref 0.0–1.0)
Platelets: 123 10*3/uL — ABNORMAL LOW (ref 140–400)
RBC: 4.79 10*6/uL (ref 4.70–6.00)
RDW: 14 % (ref 12–15)
WBC: 6.31 10*3/uL (ref 3.50–10.80)

## 2016-05-04 LAB — URINALYSIS WITH MICROSCOPIC
Bilirubin, UA: NEGATIVE
Glucose, UA: NEGATIVE
Ketones UA: NEGATIVE
Leukocyte Esterase, UA: NEGATIVE
Nitrite, UA: NEGATIVE
Protein, UR: 30 — AB
Specific Gravity UA: 1.009 (ref 1.001–1.035)
Urine pH: 6 (ref 5.0–8.0)
Urobilinogen, UA: NEGATIVE mg/dL

## 2016-05-04 LAB — CREATININE, URINE, RANDOM: Urine Creatinine, Random: 24.5 mg/dL

## 2016-05-04 LAB — HEMOLYSIS INDEX
Hemolysis Index: 38 — ABNORMAL HIGH (ref 0–18)
Hemolysis Index: 42 — ABNORMAL HIGH (ref 0–18)
Hemolysis Index: 118 — ABNORMAL HIGH (ref 0–18)

## 2016-05-04 LAB — TROPONIN I
Troponin I: 1.54 ng/mL (ref 0.00–0.09)
Troponin I: 1.5 ng/mL (ref 0.00–0.09)

## 2016-05-04 LAB — URIC ACID: Uric acid: 15.4 mg/dL — ABNORMAL HIGH (ref 3.6–9.7)

## 2016-05-04 LAB — GFR
EGFR: 15
EGFR: 17.1

## 2016-05-04 NOTE — UM Notes (Signed)
MEDICARE PART A AND B / [employer]  Admit to Inpatient (Order [id number]) 05/03/16 2203    Clinical Impression:   1. Hyperkalemia   2. Acute on chronic renal insufficiency   3. Elevated troponin   4. Elevated brain natriuretic peptide (BNP) level   5. Cardiac amyloidosis   6. History of CHF (congestive heart failure)     Gregory [name] is a 70 y.o. male with past medical history of cancer, gout presented to the ED with abnormal lab value prior to arrival. Patient had routine blood w/u at [facility] after being seen by oncologist earlier today. He was referred to ED for elevated potassium level. Family reports cough symptoms.     Past Medical History:   Diagnosis Date   . Arthritis     bilat shoulders, lt knee   . Asthma without status asthmaticus     childhood   . Cardiac amyloidosis    . CHF (congestive heart failure)    . Fracture of unspecified bones     1974 lt shoulder   . Gout    . Malignant neoplasm      BP 94/56   Pulse 91   Temp (!) 96.6 F (35.9 C) (Axillary)   Resp (!) 27   Ht 1.651 m (_0 )   Wt 63.6 kg (140 lb 3.4 oz)   SpO2 98%   BMI 23.33 kg/m     Temp:  [96.1 F (35.6 C)-98.1 F (36.7 C)]   Heart Rate:  [68-92]   Resp Rate:  [10-27]   BP: (77-110)/(53-80)   SpO2:  [88 %-100 %]   Height:  [165.1 cm (_1 )]   Weight:  [63.6 kg (140 lb 3.4 oz)]   BMI (calculated):  [23.4]     Lab Results last 48 Hours     Procedure Component Value Units Date/Time    Troponin I [[id number]]  (Abnormal) Collected:  05/04/16 1028    Specimen:  Blood Updated:  05/04/16 1113     Troponin I 1.50 (HH) ng/mL     Basic Metabolic Panel [[id number]]  (Abnormal) Collected:  05/04/16 1028    Specimen:  Blood Updated:  05/04/16 1110     Glucose 103 (H) mg/dL      BUN 123.0 (H) mg/dL      Creatinine 4.2 (H) mg/dL      Chloride 91 (L) mEq/L      Anion Gap 19.0 (H)    Hemolysis index [[id number]]  (Abnormal) Collected:  05/04/16 1028     Updated:  05/04/16 1110     Hemolysis Index 118 (H)    GFR [[id number]]  Collected:  05/04/16 1028     Updated:  05/04/16 1110     EGFR 17.1    Troponin I [[id number]]  (Abnormal) Collected:  05/04/16 0143    Specimen:  Blood Updated:  05/04/16 0402     Troponin I 1.54 (HH) ng/mL     Hemolysis index [[id number]]  (Abnormal) Collected:  05/04/16 0143     Updated:  05/04/16 0249     Hemolysis Index 42 (H)    Uric acid [[id number]]  (Abnormal) Collected:  05/04/16 0143    Specimen:  Blood Updated:  05/04/16 0244     Uric acid 15.4 (H) mg/dL     Basic Metabolic Panel [[id number]]  (Abnormal) Collected:  05/04/16 0143     BUN 126.0 (H) mg/dL      Creatinine 4.7 (H) mg/dL      Sodium  135 (L) mEq/L      Chloride 92 (L) mEq/L      Anion Gap 20.0 (H)    Hemolysis index [[id number]]  (Abnormal) Collected:  05/04/16 0143     Updated:  05/04/16 0239     Hemolysis Index 38 (H)    GFR [[id number]] Collected:  05/04/16 0143     Updated:  05/04/16 0239     EGFR 15.0    CBC without differential [[id number]]  (Abnormal) Collected:  05/04/16 0143     Platelets 123 (L) x10 3/uL      MCH 32.2 (H) pg     Troponin I [[id number]]  (Abnormal) Collected:  05/03/16 2232    Specimen:  Blood Updated:  05/03/16 2340     Troponin I 1.28 (HH) ng/mL     Basic Metabolic Panel (BMP) [[id number]]  (Abnormal) Collected:  05/03/16 2232    Specimen:  Blood Updated:  05/03/16 2303     Glucose 111 (H) mg/dL      BUN 127.0 (H) mg/dL      Creatinine 4.7 (H) mg/dL      Calcium 10.7 (H) mg/dL      Sodium 134 (L) mEq/L      Chloride 90 (L) mEq/L      Anion Gap 18.0 (H)    Hemolysis index [[id number]]  (Abnormal) Collected:  05/03/16 2232     Updated:  05/03/16 2303     Hemolysis Index 97 (H)    GFR [[id number]] Collected:  05/03/16 2232     Updated:  05/03/16 2303     EGFR 15.0    Troponin I [[id number]]  (Abnormal) Collected:  05/03/16 1850    Specimen:  Blood Updated:  05/03/16 2052     Troponin I 1.53 (HH) ng/mL     B-type Natriuretic Peptide (BNP) [[id number]]  (Abnormal) Collected:  05/03/16 1850    Specimen:  Blood Updated:  05/03/16 2045      B-Natriuretic Peptide 6,908 (H) pg/mL     Bilirubin, direct [[id number]]  (Abnormal) Collected:  05/03/16 1850     Updated:  05/03/16 1940     Bilirubin, Direct 1.9 (H) mg/dL     Comprehensive Metabolic Panel (CMP) [[id number]]  (Abnormal) Collected:  05/03/16 1850     BUN 130.0 (H) mg/dL      Creatinine 5.1 (H) mg/dL      Sodium 134 (L) mEq/L      Potassium 5.6 (H) mEq/L      Chloride 89 (L) mEq/L      Albumin 3.3 (L) g/dL      AST (SGOT) 67 (H) U/L      Alkaline Phosphatase 155 (H) U/L      Bilirubin, Total 3.5 (H) mg/dL      Globulin 3.8 (H) g/dL      Anion Gap 18.0 (H)    GFR [[id number]] Collected:  05/03/16 1850     Updated:  05/03/16 1928     EGFR 13.7    CBC and differential [[id number]]  (Abnormal) Collected:  05/03/16 1850     Platelets 136 (L) x10 3/uL      MCH 32.7 (H) pg      Abs Lymph Automated 0.41 (L) x10 3/uL         Cardiac Monitor:  Rate: 89 bpm  Rhythm: NSR    EKG:  Interpreted by the EP.              Time Interpreted: [phone]  Rate: 89 bpm              Rhythm: NSR               Interpretation: Low voltage. Poor R wave progression. Inferior Q waves.               Comparison: Compared to EKG from 04/17/16, rate has decreased.     MD note 05/03/16  Assessment:  Hyperkalemia  AKI  CKD stage 4  Amyloidosis of heart  Plan:  Admit to ICU, monitor chemistry closely  Has received kayexalate and insulin dextrose calcium  Iqbal consult for nephrology  Zaheer consult for cardiology  Usually takes revlimid 48m every other day, recently discussion with oncologist for 2.5 mg daily.  Hold diuretics for now, recent UKoreain epic negative for DVT. Check troponins, continue aspirin.     CC MD note 05/04/16  ICU problem list: hyperkalemia, acute kidney injury, chronic kidney disease stage 4, cardiac amyloidosis, congestive heart failure  Assessment:  . Acute kidney injury on chronic kidney disease stage 4 (baseline creatinine ~2.9)  . Elevated BNP  . Hyperkalemia  . Cardiac amyloidosis  . Congestive heart  failure, LVEF 25-30%  . Gout  Plan:  . BUN/Creatinine 126/4.7, elevated BNP. Makes urine. Strict I/O, monitor renal function, avoid nephrotoxic agents. Nephrology consult pending.  . Associated AKI on CKD. K 4.0 s/p kayexcelate, not presently c/o leg cramps. ECG w/o ectopy. Will monitor.  . Presently under the care of [facility]. Usually takes revlimid 571mevery other day, recently discussion with oncologist for 2.5 mg daily. Cardiology consult pending.  . On daily ASA, holding diuretics for now. Follow cardiac enzymes, BNP.  . [place] no home medication regimen. Pain management. Follow uric acid  . Heparin DVT prophylaxis.    Orders  Labs, VS, SCD's, ECG, SCD's, PT/OT    Scheduled Meds:  Current Facility-Administered Medications   Medication Dose Route Frequency   . aspirin EC  81 mg Oral Daily   . fluticasone  2 spray Each Nare Daily   . heparin (porcine)  5,000 Units [facility]H [facility] . predniSONE  20 mg Oral Daily     Continuous Infusions:   PRN Meds:.acetaminophen, prochlorperazine    Completed Meds  calcium chloride 10 % injection 1 g : Dose 1 g : Intravenous : Once  dextrose (D10W) 10% bolus 250 mL : Dose 250 mL : 1,000 mL/hr : Intravenous : Once  insulin regular (HumuLIN R,NovoLIN R) injection 3 Units : Dose 3 Units : Intravenous : Once  sodium chloride 0.9 % bolus 250 mL : Dose 250 mL : 500 mL/hr : Intravenous : Once  sodium polystyrene (KAYEXALATE) 15 GM/60ML suspension 15 g : Dose 15 g : Oral : Once    RoShanon Payor BSN, RN  Utilization Review Case Manager  Case Management Department  [id number] [phone] Jerilynn [phone] Please submit all clinical review request via fax to [phone]

## 2016-05-04 NOTE — PT Eval Note (Addendum)
[facility]  Physical Therapy Attempt Note    Patient:  Gregory [name] MRN#:  [id number]  Unit:  [facility] Room/Bed:  A2713/A2713-01      Request for PT eval received and chart reviewed. Physical Therapy eval attempted on 05/04/2016, unable to complete secondary to pt requesting therapist try back at another time. Trop elevated (also at baseline) but with evidence of mild peak. Pt states already walking with nursing staff today (RN confirms) and currently wishes to visit with son and dtr. Pt scheduled for UE doppler this date. Will continue to follow for PT eval as appropriate.    [place] Crane, [place]  05/04/2016 2:55 PM     Re-attempted pt at this time. Pt seated OOB in recliner, initially reluctant but willing to attempt following discussion re: role of PT. However, as turning lights on to begin eval, pt's family persuading pt to wait until tomorrow. Per son, "His mood is not good. He'll do better then." Pt then requesting therapist return tomorrow. D/w RN who is aware. PT to continue to follow for PT eval.    [place] Crane, [place]  05/04/2016 4:28 PM

## 2016-05-04 NOTE — Progress Notes (Signed)
Cardiology Admission Note  Impression:   Acute on chronic congestive heart failure exacerbation  Right hand pain and swelling  Hyperkalemia  Amyloidosis  Congestive heart failure with unknown ejection fraction  History of asthma  History of gout    Patient Active Problem List    Diagnosis Date Noted   . AKI (acute kidney injury) 05/04/2016   . Hyperkalemia 05/03/2016   . Amyloidosis    . Bilateral lower extremity edema    . Elevated troponin 04/21/2014   . Acute on chronic congestive heart failure, unspecified congestive heart failure type 03/18/2014   . Asthma without status asthmaticus    . Primary localized osteoarthrosis, shoulder region 06/18/2013   . Ganglion cyst of wrist, right 12/21/2012       Plan:   1. Patient is admitted with hyperkalemia and elevated BUN and creatinine consistent with acute on chronic renal failure.  2. Continue treatment for hyperkalemia as per primary team.  3. Obtain an echocardiogram to assess LV ejection fraction.  4. Patient is hypotensive today with blood pressure of 77/53 and heart rate in the 80s.  5. Patient is diuresing well despite renal failure  6. Continue supportive care for now.  7. Continue strict I's and O's and daily weight.  8. Troponin is persistently elevated typical of renal failure.  9. EKG showed sinus rhythm with nonspecific ST-T wave changes and low voltage QRS throughout.  10. Continue to observe in telemetry.  11. Further recommendations to follow depending on findings on echocardiogram and clinical progression.    Subjective:  Gregory [name] is a 70 y.o. male who is being seen on consult for Congestive heart failure.    Chief Complaint   Patient presents with   . Abnormal Lab     elevated potassium       Admission diagnosis: AKI (acute kidney injury)  Patient Active Problem List   Diagnosis   . Ganglion cyst of wrist, right   . Primary localized osteoarthrosis, shoulder region   . Asthma without status asthmaticus   . Acute on chronic congestive heart  failure, unspecified congestive heart failure type   . Elevated troponin   . Bilateral lower extremity edema   . Amyloidosis   . Hyperkalemia   . AKI (acute kidney injury)       HPI:  This is a 70 year old gentleman with past medical history of cardiomyopathy /congestive heart failure with unknown ejection fraction, amyloidosis, history of asthma, CK D stage IV, history of gout, who was recently discharged from the hospital on April 18, 2016 after treatment for acute decompensated heart failure.  Of note, patient was also seen in the emergency room on 11 5, and again on April 29, 2016 with acute flareup of gout in his hand.  He was placed on Keflex and prednisone by his PMD.  Patient had a routine blood work done which was notable for elevated potassium and he was called to back to the emergency room.  Cardiology consultation was obtained to assess patient's volume status.  Past Medical History:   Diagnosis Date   . Arthritis     bilat shoulders, lt knee   . Asthma without status asthmaticus     childhood   . Cardiac amyloidosis    . CHF (congestive heart failure)    . Fracture of unspecified bones     1974 lt shoulder   . Gout    . Malignant neoplasm      Past Surgical History:  Procedure Laterality Date   . ARTHROPLASTY, SHOULDER, TOTAL  06/18/2013    Procedure: ARTHROPLASTY, SHOULDER, TOTAL;  Surgeon: Wendi Snipes, MD;  Location: [facility];  Service: Orthopedics;  Laterality: Left;   . COLONOSCOPY     . EXCISION, GANGLION  12/19/2012    Procedure: EXCISION, GANGLION;  Surgeon: Jillyn Hidden, MD;  Location: [facility];  Service: Orthopedics;  Laterality: Right;  injection right and left shoulder   . HERNIA REPAIR      lt inguinal age 38   . INJECTION, MEDICATION  12/19/2012    Procedure: INJECTION, MEDICATION;  Surgeon: Jillyn Hidden, MD;  Location: [facility];  Service: Orthopedics;  Laterality: Bilateral;     Social History     Social History   . Marital status: Married     Spouse name:  N/A   . Number of children: N/A   . Years of education: N/A     Occupational History   . Not on file.     Social History Main Topics   . Smoking status: Never Smoker   . Smokeless tobacco: Never Used   . Alcohol use Yes      Comment: rare   . Drug use: No   . Sexual activity: No     Other Topics Concern   . Not on file     Social History Narrative   . No narrative on file     Family History   Problem Relation Age of Onset   . Family history unknown: Yes       No Known Allergies    Home Medications:   _0 @    Hospital Medications:     aspirin EC 81 mg Daily   fluticasone 2 spray Daily   heparin (porcine) 5,000 Units Q12H [facility]   predniSONE 20 mg Daily       Review of Systems:   Shortness of breath, bilateral lower extremity swelling, right hand swelling and pain.  All other symptoms were reviewed and are negative as per history and physical    Physical Assessment:   BP 94/57   Pulse 88   Temp (!) 96.6 F (35.9 C) (Axillary)   Resp (!) 27   Ht 1.651 m (_1 )   Wt 63.6 kg (140 lb 3.4 oz)   SpO2 98%   BMI 23.33 kg/m   HEENT: [place]   Neck: No JVD  Resp: Decreased breath sounds at the lung bases  CV: regular rhythm normal I1HOY s2, II/VI systolic murmur heard best at the left sternal border  Abd: Positive Bowel Sounds, Soft  Ext: +2 lower extremity edema; distal pulses intact  Neuro: Alert and nonfocal        Labs:  CBC w/Diff  Lab Results   Component Value Date/Time    WBC 6.31 05/04/2016 01:43 AM    RBC 4.79 05/04/2016 01:43 AM    HCT 45.3 05/04/2016 01:43 AM    MCV 94.6 05/04/2016 01:43 AM    MCH 32.2 (H) 05/04/2016 01:43 AM    MCHC 34.0 05/04/2016 01:43 AM    RDW 14 05/04/2016 01:43 AM    MPV 12.0 05/04/2016 01:43 AM   No results found for: BANDS, EOS    Basic Metabolic Profile  Lab Results   Component Value Date    NA 137 05/04/2016    CO2 27 05/04/2016    BUN 123.0 (H) 05/04/2016    CREAT 4.2 (H) 05/04/2016  Cardiac Enzymes  Lab Results   Component Value Date    BNP 6,908 (H) 05/03/2016        Thyroid Studies  Lab Results   Component Value Date/Time    TSH 0.62 05/04/2016 01:43 AM   No results found for: T3, FREET3, T3UPTAKE    Coagulation  Lab Results   Component Value Date    PT 16.0 (H) 04/17/2016    INR 1.3 (H) 04/17/2016       No results found for: HGBA1C  No results found for: HDL    Hepatic Function  Lab Results   Component Value Date    ALKPHOS 155 (H) 05/03/2016       Urinalysis  No components found for: USPG, UPH, UPSC, UGLU, UKET, UOCB, [place], [place], JAARS, [place], [place], [facility]      Lewayne Bunting, MD, MPH, [facility]  05/04/2016, 4:59 PM

## 2016-05-04 NOTE — Consults (Signed)
CONSULTATION    Date Time: 05/04/16 9:07 PM  Patient Name: Gregory [name], Gregory [name]  Requesting Physician: Evon Slack, MD      Reason for Consultation:   Kidney disease  Assessment:   Acute kidney injury, likely related to diuretics/hypotension/hypoperfusion.  Upward trend of creatinine.  Also contributed by improvement in volume, likely.  Creatinine showed some improvement  Urine analysis showed hyaline cast.  Hyperkalemia, likely related to above.  Also on potassium supplement at home  Chronic kidney disease stage IV.    Significant azotemia, has been on steroids. likely diuretic related.  Blood pressure is fair, on lower side.  Bilateral lower extremity edema.  Systolic and diastolic heart failure, with recent ejection fraction of 25-30 percent.  Amyloidosis  Plan:   Encourage oral hydration  Agree with holding diuretics.  Monitor the blood pressure, avoid hypotension.  Normal saline boluses if required.  Repeat urinalysis with urine electrolytes.  Medication in renal dose and avoid nephrotoxic.  Closely monitor the renal function, electrolytes and fluid status.  No emergent indication for dialysis.  However, we will closely monitor the patient.  History:   Gregory [name] is a 70 y.o. male who presents to the hospital on 05/03/2016 with Abnormal labs.  Patient has been diagnosed with amyloidosis.  Has significant cardiomyopathy.  Patient has significant fluid overload and has been on Bumex.  Patient admits having good urine  And improvement and fluid status. Recently his creatinine has trended .  He denies any significant or less, use of any other medication or use of any nonsteroidal anti-inflammatory drugs. Patient denies any urinary symptoms.  Patient is aware of kidney disease.   Past Medical History:     Past Medical History:   Diagnosis Date   . Arthritis     bilat shoulders, lt knee   . Asthma without status asthmaticus     childhood   . Cardiac amyloidosis    . CHF (congestive heart failure)    .  Fracture of unspecified bones     1974 lt shoulder   . Gout    . Malignant neoplasm        Past Surgical History:     Past Surgical History:   Procedure Laterality Date   . ARTHROPLASTY, SHOULDER, TOTAL  06/18/2013    Procedure: ARTHROPLASTY, SHOULDER, TOTAL;  Surgeon: Wendi Snipes, MD;  Location: [facility];  Service: Orthopedics;  Laterality: Left;   . COLONOSCOPY     . EXCISION, GANGLION  12/19/2012    Procedure: EXCISION, GANGLION;  Surgeon: Jillyn Hidden, MD;  Location: [facility];  Service: Orthopedics;  Laterality: Right;  injection right and left shoulder   . HERNIA REPAIR      lt inguinal age 60   . INJECTION, MEDICATION  12/19/2012    Procedure: INJECTION, MEDICATION;  Surgeon: Jillyn Hidden, MD;  Location: [facility];  Service: Orthopedics;  Laterality: Bilateral;       Family History:     Family History   Problem Relation Age of Onset   . Family history unknown: Yes       Social History:     Social History     Social History   . Marital status: Married     Spouse name: N/A   . Number of children: N/A   . Years of education: N/A     Social History Main Topics   . Smoking status: Never Smoker   . Smokeless tobacco: Never Used   .  Alcohol use Yes      Comment: rare   . Drug use: No   . Sexual activity: No     Other Topics Concern   . Not on file     Social History Narrative   . No narrative on file       Allergies:   No Known Allergies    Medications:     Current Facility-Administered Medications   Medication Dose Route Frequency   . aspirin EC  81 mg Oral Daily   . fluticasone  2 spray Each Nare Daily   . heparin (porcine)  5,000 Units Subcutaneous Q12H [facility]   . predniSONE  20 mg Oral Daily       Review of Systems:   No fever, chills  No cough, sputum  No chest pain or palpitation  No abd pain, nausea or vomiting  No urinary symptoms,  No headache, visual changes,   All other systems reviewed and negative for new problems  Physical Exam:   BP 104/64   Pulse 95   Temp 97.5 F (36.4 C)  (Axillary)   Resp (!) 27   Ht 1.651 m (_0 )   Wt 63.6 kg (140 lb 3.4 oz)   SpO2 99%   BMI 23.33 kg/m     Intake/Output Summary (Last 24 hours) at 05/04/16 2107  Last data filed at 05/04/16 2000   Gross per 24 hour   Intake             1200 ml   Output             2700 ml   Net            -1500 ml       General appearance - alert, oriented, well appearing, and in no distress  HEENT no pallor   Mucous membranes moist, skin turgor normal  Neck - JVP not raised  Chest - Bilateral air entry  Heart -S1, S2,   Abdomen - soft, nontender, nondistended,  Has  leg edema,     Labs Reviewed:       Recent Labs      05/04/16   0143  05/03/16   1850   WBC  6.31  5.60   Hgb  15.4  15.4   Hematocrit  45.3  45.2   Platelets  123*  136*       Recent Labs      05/04/16   1028  05/04/16   0143   Sodium  137  135*   Potassium  3.6  4.0   Chloride  91*  92*   CO2  27  23   BUN  123.0*  126.0*   Creatinine  4.2*  4.7*   Glucose  103*  98   Calcium  9.6  10.0       Recent Labs      05/03/16   1850   AST (SGOT)  67*   ALT  38   Alkaline Phosphatase  155*   Protein, Total  7.1   Albumin  3.3*       No results for input(s): PTT, PT, INR in the last 72 hours.    Rads:     Radiology Results (24 Hour)     Procedure Component Value Units Date/Time    US Venous Up Extrem Duplex Dopp Uni Right [[id number]] Collected:  05/04/16 1708    Order Status:  Completed Updated:  05/04/16 1713    Narrative:  RIGHT UPPER EXTREMITY VENOUS DUPLEX ULTRASOUND    HISTORY: 70 year old male with right arm pain and swelling. Patient has  history of gout of the right hand. Clinical concern for deep venous  thrombosis.    COMPARISON: Right hand x-rays 04/29/2016.    TECHNIQUE: Duplex evaluation of the right upper extremity was performed  with gated Doppler, color flow and gray scale imaging techniques.    FINDINGS: The right innominate, subclavian, axillary, and internal  jugular veins are patent without thrombus or obstruction to venous flow.  There is no  evidence of central venous obstruction. There is normal  coaptation with transverse compression of the basilic and brachial veins  to the forearm. There is focal chronic appearing thrombus in the right  cephalic vein near the antecubital fossa and the colon the remaining  right cephalic vein including through the cephalic arch is patent with  normal coaptation. The contralateral left internal jugular vein is  patent.      Impression:         1. Negative for DVT or central venous obstruction of the right upper  extremity.  2. Focal chronic appearing superficial thrombophlebitis of the right  cephalic vein near the antecubital fossa. The remaining right cephalic  vein is patent.    Maureen Ralphs, MD   05/04/2016 5:09 PM            Remus Blake, MD  05/04/2016  9:07 PM  [phone]

## 2016-05-04 NOTE — Plan of Care (Signed)
Patient a/o x4. OOB to bathroom with no assist. C/O temporary cramping, c/o R hand pain from gout flare up (+1 edema, difficulty making tight fist). C/V: HR 80s, SBP 80s-100s. Pulses +2 radial, pedal doppler. Edema RUE +1, BLEs +3-4. [place] of RUE ordered to r/o DVT. IV access: 22g LAC. BMP and Trop ordered for 1000 per NP. Resp: RA, LS clear, WDL. GI: +BS, Kayexalate given in ED. BM x3. GU: Uses urinal or bathroom. Total measured 1164m. Skin: Swelling noted, otherwise intact. Plan: doppler RUE, BMP and trop at 1000, card and neph consults.

## 2016-05-04 NOTE — Plan of Care (Signed)
Problem: Hemodynamic Status: Cardiac  Goal: Stable vital signs and fluid balance  Outcome: Progressing  Pt hemodynamically stable. Systolic blood pressures on the softer side ranging from 80-100 maintaining a MAP >65. Heart rhythm normal sinus in the 80s. Pt oxygen saturations 99% on room air. +2 pitting edema noted to bilateral lower extremities. Pedal pulses present with doppler. Mild edema noted to right upper extremity. US doppler negative for all extremities. Adequate clear, yellow urine output. UA sent to lab per nephrology. Call bell within reach. Will continue to monitor.   05/04/16 2241   Goal/Interventions addressed this shift   Stable vital signs and fluid balance Monitor/assess vital signs and telemetry per unit protocol;Weigh on admission and record weight daily;Assess signs and symptoms associated with cardiac rhythm changes;Monitor intake/output per unit protocol and/or LIP order;Monitor lab values;Monitor for leg swelling/edema and report to LIP if abnormal       Problem: Heart Failure  Goal: Stable vital signs and fluid balance  Outcome: Progressing  Pt hemodynamically stable. Systolic blood pressures on the softer side ranging from 80-100 maintaining a MAP >65. Heart rhythm normal sinus in the 80s. Pt oxygen saturations 99% on room air. +2 pitting edema noted to bilateral lower extremities. Pedal pulses present with doppler. Mild edema noted to right upper extremity. US doppler negative for all extremities. Adequate clear, yellow urine output. UA sent to lab per nephrology. Call bell within reach. Will continue to monitor.   05/04/16 2241   Goal/Interventions addressed this shift   Stable vital signs and fluid balance Monitor/assess vital signs and telemetry per unit protocol;Weigh on admission and record weight daily;Assess signs and symptoms associated with cardiac rhythm changes;Monitor intake/output per unit protocol and/or LIP order;Monitor lab values;Monitor for leg swelling/edema and report  to LIP if abnormal

## 2016-05-04 NOTE — Progress Notes (Signed)
Pt. Re-admitted to [facility] 11 /17/17. Case closed.    [name], [place], [place]  T [phone]  F [phone]

## 2016-05-04 NOTE — Progress Notes (Signed)
MCR Fx: HF    30D READMIT    Pt from home, recent [place] from [facility]. Pt denies having any DME, no hx HH/SNF per pt. Pt interested in [facility], covering CM to f/u at [place].     DCP to return home w/ spouse    Transport via spouse       05/04/16 1005   Patient Type   Within 30 Days of Previous Admission? Yes   Healthcare Decisions   Interviewed: Patient   Orientation/Decision Making Abilities of Patient Alert and Oriented x3, able to make decisions   Advance Directive Patient does not have advance directive   Healthcare Agent Appointed Yes   Prior to admission   Prior level of function Independent with ADLs;Ambulates independently   Type of Residence Private residence   Home Layout Multi-level   Have running water, electricity, heat, etc? Yes   Living Arrangements Spouse/significant other   How do you get to your MD appointments? spouse   How do you get your groceries? spouse   Who fixes your meals? spouse   Who does your laundry? spouse   Who picks up your prescriptions? spouse   Dressing Independent   Grooming Independent   Feeding Independent   Bathing Independent   Toileting Independent   DME Currently at Home Other (Comment)  (denies having any DME)   Name of Prior [place] None   Prior SNF admission? (Detail) none   Prior Rehab admission? (Detail) none   Adult Protective Services (APS) involved? No   Discharge Planning   Support Systems Spouse/significant other;Family members   Patient expects to be discharged to: home   Anticipated [place] plan discussed with: Same as interviewed   Follow up appointment scheduled? No   Reason no follow up scheduled? Family to schedule;Other (comment)  (CM to f/u at [place])   Mode of transportation: Private car (family member)  (spouse to transport at [place])   Consults/Providers   PT Evaluation Needed 2   OT Evalulation Needed 2   SLP Evaluation Needed 2   Correct PCP listed in Epic? Yes   Important Message from [facility] Notice   Patient received  1st IMM Letter? No       Royann Shivers, RN  Clinical Case Manager I  [phone]

## 2016-05-04 NOTE — Progress Notes (Signed)
ICU Daily Progress Note        Date Time: 05/04/16 8:41 AM  Patient Name: Gregory [name]  Attending Physician: Evon Slack, MD  Room: [id number]   Admit Date: 05/03/2016  LOS: 1 day       ICU problem list: hyperkalemia, acute kidney injury, chronic kidney disease stage 4, cardiac amyloidosis, congestive heart failure    Assessment:   . Acute kidney injury on chronic kidney disease stage 4 (baseline creatinine ~2.9)  . Elevated BNP  . Hyperkalemia  . Cardiac amyloidosis  . Congestive heart failure, LVEF 25-30%  . Gout    Plan:   . BUN/Creatinine 126/4.7, elevated BNP. Makes urine. Strict I/O, monitor renal function, avoid nephrotoxic agents. Nephrology consult pending.  . Associated AKI on CKD. K 4.0 s/p kayexcelate, not presently c/o leg cramps. ECG w/o ectopy. Will monitor.  . Presently under the care of [facility] oncology. Usually takes revlimid 38m every other day, recently discussion with oncologist for 2.5 mg daily. Cardiology consult pending.  . On daily ASA, holding diuretics for now. Follow cardiac enzymes, BNP.  .Marland KitchenReports no home medication regimen. Pain management. Follow uric acid  . Heparin DVT prophylaxis.    POC dicussed with patient and nurse.    Code Status: full code    Subjective:   Review of Systems - Denies HA, dizziness, lightheadedness, SOB, chest pain or pressure, abdominal pain, nausea/vomiting, altered sensorium.    Medications:      Scheduled Meds: PRN Meds:      aspirin EC 81 mg Oral Daily   fluticasone 2 spray Each Nare Daily   heparin (porcine) 5,000 Units Subcutaneous [facility]12H [facility]  predniSONE 20 mg Oral Daily       Continuous Infusions:      acetaminophen 650 mg Q6H PRN   prochlorperazine 10 mg Q4H PRN         Physical Exam:   Capillary refill <2 seconds  General Appearance:  alert, well appearing, and in no distress, sitting in bedside recliner chair, OOB as tolerated  Neuro:  alert, oriented, normal speech, no focal findings or movement disorder noted  Neck:supple, no  significant adenopathy  Lungs: on RA, clear to auscultation, no wheezes, rales or rhonchi, symmetric air entry  Cardiac: NSR, normal rate, regular rhythm, normal S1, S2, no murmurs, rubs, clicks or gallops  Abdomen:  soft, nontender, nondistended, no masses or organomegaly  Extremities: peripheral pulses normal, +1 pedal edema, mild RUE hand edema, no clubbing or cyanosis   Skin:   normal coloration and turgor, no rashes, no suspicious skin lesions noted, skin dry.  GU: voids, pale yellow urine  Data:     IBW/kg (Calculated) : 61.5    Patient Lines/Drains/Airways Status    Active PICC Line / CVC Line / PIV Line / Drain / Airway / Intraosseous Line / Epidural Line / ART Line / Line / Wound / Pressure Ulcer / NG/OG Tube     Name:   Placement date:   Placement time:   Site:   Days:    Peripheral IV 05/03/16 Left Antecubital  05/03/16    1848    Antecubital    less than 1                VITAL SIGNS   Temp:  [95.9 F (35.5 C)-98.1 F (36.7 C)] 96.1 F (35.6 C)  Heart Rate:  [68-90] 83  Resp Rate:  [10-24] 13  BP: (84-110)/(60-80) 104/70  Blood Glucose:  Pulse ox:  Telemetry: normal sinus rhythm       Intake/Output Summary (Last 24 hours) at 05/04/16 0841  Last data filed at 05/04/16 0700   Gross per 24 hour   Intake              200 ml   Output             1150 ml   Net             -950 ml        Labs:     CBC w/Diff CMP     Recent Labs  Lab 05/04/16  0143 05/03/16  1850 04/29/16  0255   WBC 6.31 5.60 5.86   Hgb 15.4 15.4 15.4   Hematocrit 45.3 45.2 43.9   Platelets 123* 136* 139*   MCV 94.6 96.0 93.4   Neutrophils  --  83.5 68.3          PT/INR           Recent Labs  Lab 05/04/16  0143 05/03/16  2232 05/03/16  1850 04/29/16  0255   Sodium 135* 134* 134* 131*   Potassium 4.0 4.9 5.6* 5.1   Chloride 92* 90* 89* 91*   CO2 _0 BUN 126.0* 127.0* 130.0* 94.0*   Creatinine 4.7* 4.7* 5.1* 3.4*   Glucose 98 111* 90 118*   Calcium 10.0 10.7* 10.0 9.3   Protein, Total  --   --  7.1 7.0   Albumin  --   --  3.3* 3.6    AST (SGOT)  --   --  67* 80*   ALT  --   --  38 35   Alkaline Phosphatase  --   --  155* 161*   Bilirubin, Total  --   --  3.5* 5.2*       Recent Labs  Lab 05/04/16  0143 05/03/16  2232 05/03/16  1850   Troponin I 1.54* 1.28* 1.53*       _1 @   Glucose POCT     Recent Labs  Lab 05/04/16  0143 05/03/16  2232 05/03/16  1850 04/29/16  0255   Glucose 98 111* 90 118*        Rads:   Radiological Imaging personally reviewed.    I have personally reviewed the patient's history and 24 hour interval events, along with vitals, labs, radiology images.     So far today I have spent 35 critical care minutes providing care for this patient excluding teaching and billable procedures, and not overlapping with any other providers.    Signed by: Cathi Roan, ACNP-BC  Date/Time: 05/04/16 8:41 AM  Critical Care  Performed by: Paulita Cradle [name]  Authorized by: Paulita Cradle [name]   Total critical care time: 35 minutes  Critical care time was exclusive of separately billable procedures and treating other patients.  Critical care was necessary to treat or prevent imminent or life-threatening deterioration of the following conditions: circulatory failure, renal failure and cardiac failure.  Critical care was time spent personally by me on the following activities: development of treatment plan with patient or surrogate, discussions with consultants, examination of patient, ordering and performing treatments and interventions, ordering and review of radiographic studies, re-evaluation of patient's condition, review of old charts, ordering and review of laboratory studies and evaluation of patient's response to treatment.

## 2016-05-04 NOTE — Progress Notes (Signed)
05/04/16 [facility] Readmission Patient Interview/Contributing Factors   Kept informed about diagnosis during hospital stay? All of the time   At discharge, discuss diagnoses? Yes   At discharge, discuss procedures? Yes   At discharge, discuss signs/sympoms? Yes   At discharge, discuss what to do for worsening of disease? Yes   At discharge, discuss who to contact? Yes   Asked if you understood instructions? Yes   D/C Instructions written, given to you? Yes   D/C Instructions easy to read? Yes   How confident about understanding instructions? Very confident   Regular doctor for most things? Yes   At discharge, discuss medications, diet, activity? Yes   Take meds as prescribed, follow diet/activity restrictions?  Yes   Post-discharge appointment with MD? Yes   Who made the appointment? Patient's family   After discharge, how long before your appointment? About a week   Contributing Factors to Readmission Comorbidities not managed   Patient active with Home Health? No, patient refused on previous discharge   Patient active with home hospice? No   Was patient readmitted from a facility? Not readmitted from a facility     Royann Shivers, RN  Clinical Case Manager I  [phone]

## 2016-05-04 NOTE — Progress Notes (Signed)
Patient seen and examined in the ICU.  Patient with improving symptoms.    Patient's hyperkalemia improved.  Status post Kayexalate, insulin and dextrose    Patient with slight improvement in creatinine from 4.7 to 4.2.  Persistent acute renal failure, possibly secondary to diuresis during his recent multiple hospitalizations versus developing cardiorenal syndrome.    Continue monitor patient's blood pressure and heart rate closely.    Follow-up echocardiogram    Continue supportive care    Follow-up nephrology recommendation

## 2016-05-04 NOTE — Plan of Care (Signed)
Problem: Hemodynamic Status: Cardiac  Goal: Stable vital signs and fluid balance  Outcome: Progressing   05/04/16 1736   Goal/Interventions addressed this shift   Stable vital signs and fluid balance Monitor/assess vital signs and telemetry per unit protocol;Weigh on admission and record weight daily;Assess signs and symptoms associated with cardiac rhythm changes;Monitor intake/output per unit protocol and/or LIP order;Monitor lab values;Monitor for leg swelling/edema and report to LIP if abnormal   SR on the monitor, BP on Low side but not symptomatic, troponin peaked, denies chest pain. Swelling on lower extremities going down to +2.

## 2016-05-04 NOTE — Plan of Care (Signed)
Problem: Renal Instability  Goal: Fluid and electrolyte balance are achieved/maintained  Outcome: Progressing  Bun/ct trending down but remain high. No iv fluid in use due to CHF. K is WNL so are the other electrolytes. Adequate fluid intake and out put.

## 2016-05-04 NOTE — Consults (Signed)
[facility]  MEDICINE CONSULT NOTE      Patient: Gregory [name]  Date: 05/03/2016   DOB: [birth year]  Admission Date: 05/03/2016   MRN: [id number]  Attending: Evon Slack, MD       Reason for Consult: Gregory [name], hyperkalemia  Requesting Physician: Evon Slack, MD  Consulting Physician: Albina Billet     Chief Complaint   Patient presents with   . Abnormal Lab     elevated potassium      HISTORY AND PHYSICAL     Mr. Gregory Nez Smithis a 70 y.o.malewith PMH of amyloidosis complicated byCHF (47-42%), CKD Stage 4, asthma, HTN who presents with hyperkalemia. He saw his new PCP 05/02/16 and was placed on keflex with prednisone.  Today he was scheduled to see his oncologist. He had routine blood work done and then was called to come in due to hyperkalemia.  Patient has creatinine of 5 and BUN over 100    He was recently discharged on 04/18/16 from hospital after being treated for acute decompensated heart failure.  Patient has history of amyloidosis, CHF with EF 25-30%, and CKD stage 4, HTN, asthma.  Prior to this he was at [facility] 10/13-17/17 for the same. His BNP in most recent hospitalization went from 17, 000 to 4, 000 with bumex and zaroxalyn.     Past Medical History:   Diagnosis Date   . Arthritis     bilat shoulders, lt knee   . Asthma without status asthmaticus     childhood   . Cardiac amyloidosis    . CHF (congestive heart failure)    . Fracture of unspecified bones     1974 lt shoulder   . Gout    . Malignant neoplasm        Past Surgical History:   Procedure Laterality Date   . ARTHROPLASTY, SHOULDER, TOTAL  06/18/2013    Procedure: ARTHROPLASTY, SHOULDER, TOTAL;  Surgeon: Wendi Snipes, MD;  Location: [facility];  Service: Orthopedics;  Laterality: Left;   . COLONOSCOPY     . EXCISION, GANGLION  12/19/2012    Procedure: EXCISION, GANGLION;  Surgeon: Jillyn Hidden, MD;  Location: [facility];  Service: Orthopedics;  Laterality: Right;  injection right and left shoulder    . HERNIA REPAIR      lt inguinal age 17   . INJECTION, MEDICATION  12/19/2012    Procedure: INJECTION, MEDICATION;  Surgeon: Jillyn Hidden, MD;  Location: [facility];  Service: Orthopedics;  Laterality: Bilateral;       Prior to Admission medications    Medication Sig Start Date End Date Taking? Authorizing Provider   methacholine (PROVOCHOLINE) 100 MG inhaler solution Inhale into the lungs once.   Yes [provider]   potassium chloride (KLOR-CON) 20 MEQ packet Take 20 mEq by mouth.   Yes [provider]   predniSONE (DELTASONE) 20 MG tablet Take 20 mg by mouth daily.   Yes [provider]   aspirin EC 81 MG EC tablet Take 81 mg by mouth daily.    [provider]   bumetanide (BUMEX) 2 MG tablet Take 6 mg by mouth 2 (two) times daily.    [provider]   cephalexin (KEFLEX) 500 MG capsule Take 1 capsule (500 mg total) by mouth 2 (two) times daily.for 7 days 04/29/16 05/06/16  Ned Card, MD   clindamycin (CLEOCIN) 300 MG capsule Take 1 capsule (300 mg total) by mouth 3 (three)  times daily.for 7 days 04/29/16 05/06/16  Ned Card, MD   dexamethasone (DECADRON) 4 MG tablet Take 5 tablets (20 mg total) by mouth See Admin Instructions.Take 20 mg of Dexamethasone once a week 04/18/16   Marilynne Halsted, MD   fluticasone (FLONASE) 50 MCG/ACT nasal spray 2 sprays by Nasal route daily.    [provider]   HYDROcodone-acetaminophen (NORCO) 5-325 MG per tablet Take 1 tablet by mouth every 6 (six) hours as needed for Pain.for up to 10 doses 04/29/16   Ned Card, MD   Lenalidomide (REVLIMID) 5 MG Cap Take 5 mg by mouth every other day.Days 1-21. Hold for 7 days. For Amyloidosis 04/18/16   Marilynne Halsted, MD   Lenalidomide (REVLIMID) 5 MG Cap Take by mouth.    [provider]   metOLazone (ZAROXOLYN) 5 MG tablet Take 5 mg by mouth as needed (PRN Sun, Tues Thus if weight >135).    [provider]   metOLazone  (ZAROXOLYN) 5 MG tablet Take 5 mg by mouth daily.    [provider]   oxyCODONE (ROXICODONE) 5 MG immediate release tablet Take 1-2 tablets (5-10 mg total) by mouth every 4 (four) hours as needed for Pain.1-2 tablets every 4-6 hours as needed for pain 04/21/16   Ned Card, MD   potassium chloride (K-TAB,KLOR-CON) 10 MEQ tablet Take 10 mEq by mouth 2 (two) times daily.    [provider]   prochlorperazine (COMPAZINE) 10 MG tablet Take 10 mg by mouth every 6 (six) hours as needed (Nausea).    [provider]       No Known Allergies    CODE STATUS: FULL CODE    PRIMARY CARE MD: [name], [name], MD    Family History   Problem Relation Age of Onset   . Family history unknown: Yes       Social History   Substance Use Topics   . Smoking status: Never Smoker   . Smokeless tobacco: Never Used   . Alcohol use Yes      Comment: rare       REVIEW OF SYSTEMS       A complete review of systems was completed and all others are negative.    PHYSICAL EXAM     Vital Signs (most recent): BP 94/66   Pulse 81   Temp 98.1 F (36.7 C) (Oral)   Resp 13   Ht 1.651 m (_0 )   Wt 63.6 kg (140 lb 3.4 oz)   SpO2 99%   BMI 23.33 kg/m   Constiutional:  Patient speaks freely in full sentences.   HEENT: +JVD  Cardiovascular: RRR, + murmurs, +JVD  Respiratory: Normal rate. No retractions or increased work of breathing. Clear to auscultation and percussion bilaterally.  Gastrointestinal: +BS, non-distended, soft, non-tender, no rebound or guarding, no hepatosplenomegaly  Genitourinary: no suprapubic or costovertebral angle tenderness  Musculoskeletal: Edema  Skin: no rashes, jaundice or other lesions  Neurologic: EOMI, CN 2-12 grossly intact. no gross motor or sensory deficits, patellar and bicep DTR 2+ and symmetric  Psychiatric: affect and mood appropriate. The patient is alert, interactive, appropriate.    LABS & IMAGING     Recent Results (from the past 24 hour(s))   CBC and  differential    Collection Time: 05/03/16  6:50 PM   Result Value Ref Range    WBC 5.60 3.50 - 10.80 x10 3/uL    Hgb 15.4 13.0 - 17.0 g/dL  Hematocrit 45.2 42.0 - 52.0 %    Platelets 136 (L) 140 - 400 x10 3/uL    RBC 4.71 4.70 - 6.00 x10 6/uL    MCV 96.0 80.0 - 100.0 fL    MCH 32.7 (H) 28.0 - 32.0 pg    MCHC 34.1 32.0 - 36.0 g/dL    RDW 14 12 - 15 %    MPV 11.0 9.4 - 12.3 fL    Neutrophils 83.5 None %    Lymphocytes Automated 7.3 None %    Monocytes 8.2 None %    Eosinophils Automated 0.2 None %    Basophils Automated 0.4 None %    Immature Granulocyte 0.4 None %    Nucleated RBC 0.0 0.0 - 1.0 /100 WBC    Neutrophils Absolute 4.68 1.80 - 8.10 x10 3/uL    Abs Lymph Automated 0.41 (L) 0.50 - 4.40 x10 3/uL    Abs Mono Automated 0.46 0.00 - 1.20 x10 3/uL    Abs Eos Automated 0.01 0.00 - 0.70 x10 3/uL    Absolute Baso Automated 0.02 0.00 - 0.20 x10 3/uL    Absolute Immature Granulocyte 0.02 0 x10 3/uL    Absolute NRBC 0.00 0 x10 3/uL   Comprehensive Metabolic Panel (CMP)    Collection Time: 05/03/16  6:50 PM   Result Value Ref Range    Glucose 90 70 - 100 mg/dL    BUN 130.0 (H) 9.0 - 28.0 mg/dL    Creatinine 5.1 (H) 0.7 - 1.3 mg/dL    Sodium 134 (L) 136 - 145 mEq/L    Potassium 5.6 (H) 3.5 - 5.1 mEq/L    Chloride 89 (L) 100 - 111 mEq/L    CO2 27 22 - 29 mEq/L    Calcium 10.0 8.5 - 10.5 mg/dL    Protein, Total 7.1 6.0 - 8.3 g/dL    Albumin 3.3 (L) 3.5 - 5.0 g/dL    AST (SGOT) 67 (H) 5 - 34 U/L    ALT 38 0 - 55 U/L    Alkaline Phosphatase 155 (H) 38 - 106 U/L    Bilirubin, Total 3.5 (H) 0.2 - 1.2 mg/dL    Globulin 3.8 (H) 2.0 - 3.6 g/dL    Albumin/Globulin Ratio 0.9 0.9 - 2.2    Anion Gap 18.0 (H) 5.0 - 15.0   Hemolysis index    Collection Time: 05/03/16  6:50 PM   Result Value Ref Range    Hemolysis Index 7 0 - 18   GFR    Collection Time: 05/03/16  6:50 PM   Result Value Ref Range    EGFR 13.7    Bilirubin, direct    Collection Time: 05/03/16  6:50 PM   Result Value Ref Range    Bilirubin, Direct 1.9 (H) 0.0 - 0.5  mg/dL   Troponin I    Collection Time: 05/03/16  6:50 PM   Result Value Ref Range    Troponin I 1.53 (HH) 0.00 - 0.09 ng/mL   B-type Natriuretic Peptide (BNP)    Collection Time: 05/03/16  6:50 PM   Result Value Ref Range    B-Natriuretic Peptide 6,908 (H) 0 - 100 pg/mL   Basic Metabolic Panel (BMP)    Collection Time: 05/03/16 10:32 PM   Result Value Ref Range    Glucose 111 (H) 70 - 100 mg/dL    BUN 127.0 (H) 9.0 - 28.0 mg/dL    Creatinine 4.7 (H) 0.7 - 1.3 mg/dL    Calcium 10.7 (H) 8.5 -  10.5 mg/dL    Sodium 134 (L) 136 - 145 mEq/L    Potassium 4.9 3.5 - 5.1 mEq/L    Chloride 90 (L) 100 - 111 mEq/L    CO2 26 22 - 29 mEq/L    Anion Gap 18.0 (H) 5.0 - 15.0   Hemolysis index    Collection Time: 05/03/16 10:32 PM   Result Value Ref Range    Hemolysis Index 97 (H) 0 - 18   GFR    Collection Time: 05/03/16 10:32 PM   Result Value Ref Range    EGFR 15.0    Troponin I    Collection Time: 05/03/16 10:32 PM   Result Value Ref Range    Troponin I 1.28 (HH) 0.00 - 0.09 ng/mL   Basic Metabolic Panel    Collection Time: 05/04/16  1:43 AM   Result Value Ref Range    Glucose 98 70 - 100 mg/dL    BUN 126.0 (H) 9.0 - 28.0 mg/dL    Creatinine 4.7 (H) 0.7 - 1.3 mg/dL    Calcium 10.0 8.5 - 10.5 mg/dL    Sodium 135 (L) 136 - 145 mEq/L    Potassium 4.0 3.5 - 5.1 mEq/L    Chloride 92 (L) 100 - 111 mEq/L    CO2 23 22 - 29 mEq/L    Anion Gap 20.0 (H) 5.0 - 15.0   Hemolysis index    Collection Time: 05/04/16  1:43 AM   Result Value Ref Range    Hemolysis Index 38 (H) 0 - 18   GFR    Collection Time: 05/04/16  1:43 AM   Result Value Ref Range    EGFR 15.0    CBC without differential    Collection Time: 05/04/16  1:43 AM   Result Value Ref Range    WBC 6.31 3.50 - 10.80 x10 3/uL    Hgb 15.4 13.0 - 17.0 g/dL    Hematocrit 45.3 42.0 - 52.0 %    Platelets 123 (L) 140 - 400 x10 3/uL    RBC 4.79 4.70 - 6.00 x10 6/uL    MCV 94.6 80.0 - 100.0 fL    MCH 32.2 (H) 28.0 - 32.0 pg    MCHC 34.0 32.0 - 36.0 g/dL    RDW 14 12 - 15 %    MPV 12.0 9.4 - 12.3  fL    Nucleated RBC 0.0 0.0 - 1.0 /100 WBC    Absolute NRBC 0.00 0 x10 3/uL   Uric acid    Collection Time: 05/04/16  1:43 AM   Result Value Ref Range    Uric acid 15.4 (H) 3.6 - 9.7 mg/dL   TSH    Collection Time: 05/04/16  1:43 AM   Result Value Ref Range    Thyroid Stimulating Hormone 0.62 0.35 - 4.94 uIU/mL       IMAGING:  CXR clear    ASSESSMENT & PLAN       Acute systolic right heart failure  AKI  Hyperkalemia  Elevated troponin  Amyloidosis     Patient shows signs of decompensated right heart failure given significant JVD and leg edema. CXR is clear.  He requires diuresis. His BP is low. He would benefit from Dopamine infusion if blood pressure further drops.  Kidney function could be worse from cardiorenal syndrome.  Cardiology, nephrology and oncology consulted by ED  Hyperkalemia resolved with insulin, kayexalate  Troponin likely in setting of CHF and AKI  Thank for for the opportunity to be involved in Huntley Dec Savell's care. We will continue to follow.    Drucie Ip  05/04/2016 2:49 AM

## 2016-05-04 NOTE — Plan of Care (Signed)
Problem: Hemodynamic Status: Cardiac  Goal: Stable vital signs and fluid balance  Outcome: Progressing   05/04/16 1958   Goal/Interventions addressed this shift   Stable vital signs and fluid balance Monitor/assess vital signs and telemetry per unit protocol;Weigh on admission and record weight daily;Assess signs and symptoms associated with cardiac rhythm changes;Monitor intake/output per unit protocol and/or LIP order;Monitor lab values;Monitor for leg swelling/edema and report to LIP if abnormal       Problem: Heart Failure  Goal: Stable vital signs and fluid balance  Outcome: Progressing   05/04/16 1958   Goal/Interventions addressed this shift   Stable vital signs and fluid balance Monitor/assess vital signs and telemetry per unit protocol;Weigh on admission and record weight daily;Assess signs and symptoms associated with cardiac rhythm changes;Monitor intake/output per unit protocol and/or LIP order;Monitor lab values;Monitor for leg swelling/edema and report to LIP if abnormal

## 2016-05-05 ENCOUNTER — Inpatient Hospital Stay: Payer: Medicare Other

## 2016-05-05 LAB — CBC
Absolute NRBC: 0 10*3/uL
Hematocrit: 43.4 % (ref 42.0–52.0)
Hgb: 15.1 g/dL (ref 13.0–17.0)
MCH: 32.9 pg — ABNORMAL HIGH (ref 28.0–32.0)
MCHC: 34.8 g/dL (ref 32.0–36.0)
MCV: 94.6 fL (ref 80.0–100.0)
MPV: 11.8 fL (ref 9.4–12.3)
Nucleated RBC: 0 /100 WBC (ref 0.0–1.0)
Platelets: 140 10*3/uL (ref 140–400)
RBC: 4.59 10*6/uL — ABNORMAL LOW (ref 4.70–6.00)
RDW: 14 % (ref 12–15)
WBC: 6.58 10*3/uL (ref 3.50–10.80)

## 2016-05-05 LAB — BASIC METABOLIC PANEL
Anion Gap: 19 — ABNORMAL HIGH (ref 5.0–15.0)
BUN: 123 mg/dL — ABNORMAL HIGH (ref 9.0–28.0)
CO2: 26 mEq/L (ref 22–29)
Calcium: 9.5 mg/dL (ref 8.5–10.5)
Chloride: 90 mEq/L — ABNORMAL LOW (ref 100–111)
Creatinine: 3.7 mg/dL — ABNORMAL HIGH (ref 0.7–1.3)
Glucose: 104 mg/dL — ABNORMAL HIGH (ref 70–100)
Potassium: 3 mEq/L — ABNORMAL LOW (ref 3.5–5.1)
Sodium: 135 mEq/L — ABNORMAL LOW (ref 136–145)

## 2016-05-05 LAB — ECG 12-LEAD
Atrial Rate: 89 {beats}/min
P Axis: 65 degrees
P-R Interval: 140 ms
Q-T Interval: 312 ms
QRS Duration: 90 ms
QTC Calculation (Bezet): 379 ms
R Axis: -87 degrees
T Axis: 90 degrees
Ventricular Rate: 89 {beats}/min

## 2016-05-05 LAB — POTASSIUM, URINE, RANDOM: Urine Potassium Random: 21.3 mEq/L

## 2016-05-05 LAB — SODIUM, URINE, RANDOM: Urine Sodium Random: 83 mEq/L

## 2016-05-05 LAB — GFR: EGFR: 19.8

## 2016-05-05 LAB — CHLORIDE, URINE, RANDOM: Chloride, UR: 95 mEq/L

## 2016-05-05 LAB — HEMOLYSIS INDEX: Hemolysis Index: 54 — ABNORMAL HIGH (ref 0–18)

## 2016-05-05 MED ORDER — POTASSIUM CHLORIDE CRYS ER 20 MEQ PO TBCR
40.0000 meq | EXTENDED_RELEASE_TABLET | Freq: Once | ORAL | Status: AC
Start: 2016-05-05 — End: 2016-05-05
  Administered 2016-05-05: 40 meq via ORAL
  Filled 2016-05-05 (×2): qty 2

## 2016-05-05 NOTE — Progress Notes (Signed)
[facility]  PROGRESS NOTE      Patient: Gregory [name]  Date: 05/05/2016   LOS: 2 Days  Admission Date: 05/03/2016   MRN: [id number]  Attending: Marya Landry  Please contact me on the following Pager.  [phone]      ASSESSMENT/PLAN     Gregory [name] is a 70 y.o. male admitted with AKI (acute kidney injury)    Interval Summary:     Patient Active Hospital Problem List:   AKI (acute kidney injury)On chronic kidney disease stage IV (05/04/2016)    Assessment: Patient's acute kidney injury, likely due to overuse of diuretics, hypotension, hypoperfusion.  Improving     Plan: Continue to hold Diuril headaches, manage blood pressure, follow-up with nephrology recommendations    Acute on chronic systolic/diastolic congestive heart failure (03/18/2014)    Assessment: Status post echocardiogram showing left ventricle ejection fraction of 20-25 percent with severe diffuse hypokinesis.      Plan: An 70 year old diuretics in order to avoid hypoperfusion of his kidneys.  Continue to follow closely follow up with cardiology recommendation    Elevated troponin (04/21/2014)    Assessment: Likely due to his renal failure and demand ischemia     Plan: Continue monitor closely    Amyloidosis ()    Assessment: Stable and chronic     Plan: Continue to monitor    Hyperkalemia (05/03/2016)   Hypokalemia     Assessment: Status post Kayexalate, insulin and dextrose Patient currently having hypokalemia of 3.0     Plan: Repleted as per ICU protocol           Analgesia: Tylenol    Nutrition: Renal    DVT Prophylaxis: Heparin       Code Status: Full    DISPO: To be determined             SUBJECTIVE     Gregory [name] states That he feels fine.  Denies any fever, chills, nausea, vomiting, diarrhea, chest pain, shortness of breath.  Does feel little bit weak    MEDICATIONS     Current Facility-Administered Medications   Medication Dose Route Frequency   . aspirin EC  81 mg Oral Daily   . fluticasone  2 spray Each Nare Daily   .  heparin (porcine)  5,000 Units Subcutaneous Q12H [facility]   . predniSONE  20 mg Oral Daily       PHYSICAL EXAM     Vitals:    05/05/16 1400   BP: 96/67   Pulse: 90   Resp:    Temp:    SpO2: 96%       Temperature: Temp  Min: 97.5 F (36.4 C)  Max: 98.4 F (36.9 C)  Pulse: Pulse  Min: 76  Max: 95  Respiratory: No Data Recorded  Non-Invasive BP: BP  Min: 81/56  Max: 104/64  Pulse Oximetry SpO2  Min: 95 %  Max: 99 %    Intake and Output Summary (Last 24 hours) at Date Time    Intake/Output Summary (Last 24 hours) at 05/05/16 1550  Last data filed at 05/05/16 1400   Gross per 24 hour   Intake              320 ml   Output             2750 ml   Net            -2430 ml  GEN APPEARANCE: Normal;  A&OX3  HEENT: PERLA; EOMI; Conjunctiva Clear  NECK: Supple; No bruits  CVS: RRR, S1, S2; No M/G/R  LUNGS: CTAB; No Wheezes; No Rhonchi: No rales  ABD: Soft; No TTP; + Normoactive BS  EXT: No edema; Pulses 2+ and intact  Skin exam:  no pallor  NEURO: CN 2-12 intact; No Focal neurological deficits  CAP REFILL:  Normal  MENTAL STATUS:  Normal        LABS       Recent Labs  Lab 05/05/16  0335 05/04/16  0143 05/03/16  1850   WBC 6.58 6.31 5.60   RBC 4.59* 4.79 4.71   Hgb 15.1 15.4 15.4   Hematocrit 43.4 45.3 45.2   MCV 94.6 94.6 96.0   Platelets 140 123* 136*         Recent Labs  Lab 05/05/16  0335 05/04/16  1028 05/04/16  0143 05/03/16  2232 05/03/16  1850   Sodium 135* 137 135* 134* 134*   Potassium 3.0* 3.6 4.0 4.9 5.6*   Chloride 90* 91* 92* 90* 89*   CO2 _0 BUN 123.0* 123.0* 126.0* 127.0* 130.0*   Creatinine 3.7* 4.2* 4.7* 4.7* 5.1*   Glucose 104* 103* 98 111* 90   Calcium 9.5 9.6 10.0 10.7* 10.0         Recent Labs  Lab 05/03/16  1850 04/29/16  0255   ALT 38 35   AST (SGOT) 67* 80*   Bilirubin, Total 3.5* 5.2*   Bilirubin, Direct 1.9*  --    Albumin 3.3* 3.6   Alkaline Phosphatase 155* 161*         Recent Labs  Lab 05/04/16  1028 05/04/16  0143 05/03/16  2232   Troponin I 1.50* 1.54* 1.28*             Microbiology  Results     Procedure Component Value Units Date/Time    MRSA culture (If not [name] in Triage) [[id number]] Collected:  05/03/16 2314    Specimen:  Body Fluid from Nasal/Throat ASC Admission Updated:  05/04/16 2337    Narrative:       ORDER#: [id number]                                    ORDERED BY: WHITE, SUSAN  SOURCE: Nares and Throat                             COLLECTED:  05/03/16 23:14  ANTIBIOTICS AT COLL.:                                RECEIVED :  05/04/16 05:01  Culture MRSA Surveillance                  FINAL       05/04/16 23:36  05/04/16   Negative for Methicillin Resistant Staph aureus from Nares and             Negative for Methicillin Resistant Staph aureus from Throat             RADIOLOGY     Chest 2 Views    Result Date: 05/03/2016   No acute pulmonary infiltrates are demonstrated. Steward Drone, MD 05/03/2016 8:40 PM     Hand  Right Pa Lateral And Oblique    Result Date: 04/29/2016  No acute osseous process. Guy Sandifer, MD 04/29/2016 3:40 AM     Ct Head Without Contrast    Result Date: 04/17/2016   Stable examination of the brain without evidence of acute intracranial pathology. Please see discussion above for details. Prentice Docker, MD 04/17/2016 2:04 PM     Xr Chest  Ap Portable    Result Date: 04/17/2016   No acute findings. [name], MD 04/17/2016 1:47 PM     US Venous Up Extrem Duplex Dopp Uni Right    Result Date: 05/04/2016  1. Negative for DVT or central venous obstruction of the right upper extremity. 2. Focal chronic appearing superficial thrombophlebitis of the right cephalic vein near the antecubital fossa. The remaining right cephalic vein is patent. Maureen Ralphs, MD 05/04/2016 5:09 PM       Signed,  Marya Landry  3:50 PM 05/05/2016

## 2016-05-05 NOTE — Plan of Care (Signed)
Problem: Safety  Goal: Patient will be free from injury during hospitalization  Outcome: Progressing   05/05/16 1531   Goal/Interventions addressed this shift   Patient will be free from injury during hospitalization  Assess patient's risk for falls and implement fall prevention plan of care per policy;Provide and maintain safe environment;Use appropriate transfer methods;Ensure appropriate safety devices are available at the bedside;Include patient/ family/ care giver in decisions related to safety;Hourly rounding;Assess for patients risk for elopement and implement Elopement Risk Plan per policy       Problem: Pain  Goal: Pain at adequate level as identified by patient  Outcome: Progressing   05/05/16 1531   Goal/Interventions addressed this shift   Pain at adequate level as identified by patient Identify patient comfort function goal;Assess pain on admission, during daily assessment and/or before any "as needed" intervention(s);Reassess pain within 30-60 minutes of any procedure/intervention, per Pain Assessment, Intervention, Reassessment (AIR) Cycle;Evaluate if patient comfort function goal is met;Evaluate patient's satisfaction with pain management progress;Offer non-pharmacological pain management interventions;Consult/collaborate with Physical Therapy, Occupational Therapy, and/or Speech Therapy;Include patient/patient care companion in decisions related to pain management as needed       Problem: Hemodynamic Status: Cardiac  Goal: Stable vital signs and fluid balance  Outcome: Progressing   05/05/16 1531   Goal/Interventions addressed this shift   Stable vital signs and fluid balance Monitor/assess vital signs and telemetry per unit protocol;Weigh on admission and record weight daily;Assess signs and symptoms associated with cardiac rhythm changes;Monitor intake/output per unit protocol and/or LIP order;Monitor lab values;Monitor for leg swelling/edema and report to LIP if abnormal       Problem: Renal  Instability  Goal: Fluid and electrolyte balance are achieved/maintained  Outcome: Progressing   05/05/16 1531   Goal/Interventions addressed this shift   Fluid and electrolyte balance are achieved/maintained  Monitor intake and output every shift;Monitor/assess lab values and report abnormal values;Monitor daily weight;Provide adequate hydration;Assess for confusion/personality changes;Assess and reassess fluid and electrolyte status;Observe for cardiac arrhythmias;Monitor for muscle weakness;Follow fluid restrictions/IV/PO parameters       Problem: Heart Failure  Goal: Stable vital signs and fluid balance  Outcome: Progressing   05/05/16 1531   Goal/Interventions addressed this shift   Stable vital signs and fluid balance Monitor/assess vital signs and telemetry per unit protocol;Weigh on admission and record weight daily;Assess signs and symptoms associated with cardiac rhythm changes;Monitor intake/output per unit protocol and/or LIP order;Monitor lab values;Monitor for leg swelling/edema and report to LIP if abnormal       Comments: S: pt alert and oriented X 4, has occasional complaints of L hand pain and lower extremity cramps resolved with warm compresses and movement respectively.   Pt oob to bathroom and walked in hallway with physical therapy; tolerated well; denies shortness of breath or dizziness. Pt otherwise up in chair all day.   Pt's vss wnl except from some hypotension, sbp 80s. Pt is asymptomatic (no complaints of dizziness, shortness of breath, mentation wnl).  Pt kept safe, free from falls and injury with fall mats present and chair alarm set.   Pt informed r/t current status and progress.    P: Continue to monitor vss per unit protocol. Keep pt safe, comfortable, free from falls and injury. Advance and encourage activity as tolerated. Keep pt informed r/t current status and progress. Prepare pt for transfer to step-down unit once bed becomes available.

## 2016-05-05 NOTE — Progress Notes (Signed)
RENAL PROGRESS NOTE    Date Time: 05/05/16 9:08 PM  Patient Name: Gregory [name]  Attending Physician: Evon Slack, MD    Assessment:   Acute kidney injury, likely related to diuresis/hypertension/hypoperfusion. Creatinine trended down.  Chronic kidney disease stage IV,  Significant acidemia  Blood pressure is on the lower side.  Bilateral lower extremity edema.  Systolic and diastolic congestive heart failure.  Amyloidosis  Plan:   Diuretics on hold.  Use as needed.  Fluid restriction to less than 1500 mL per 24 hour.  Follow the blood pressure and avoid hypotension.  Will check urine protein to creatinine ratio, random.  Likely need evaluation for renal failure  Monitor the electrolytes, fluid status and renal function.  Please try to obtain information regarding nephrology follow-up at [facility].  No emergent indication for dialysis.    Subjective:   Patient is seen and examined at bedside.  Sitting in the chair, not in acute distress.  Awake and alert    Review of Systems:   Did not offer any significant complaint.  Denies any nausea, vomiting, chest pain, palpitation, headache, visual changes, cough or dyspnea.    Meds:     Current Facility-Administered Medications   Medication Dose Route Frequency   . aspirin EC  81 mg Oral Daily   . fluticasone  2 spray Each Nare Daily   . heparin (porcine)  5,000 Units Subcutaneous Q12H [facility]   . predniSONE  20 mg Oral Daily       Physical Exam:   BP 94/66   Pulse 95   Temp (!) 96 F (35.6 C) (Oral)   Resp 18   Ht 1.651 m (_0 )   Wt 63.6 kg (140 lb 3.4 oz)   SpO2 100%   BMI 23.33 kg/m     Intake/Output Summary (Last 24 hours) at 05/05/16 2108  Last data filed at 05/05/16 1906   Gross per 24 hour   Intake              100 ml   Output             2200 ml   Net            -2100 ml       General appearance - alert, oriented, well appearing, and in no distress  HEENT no pallor   Mucous membranes moist, skin turgor normal  Neck - JVP not raised  Chest -  Bilateral air entry  Heart -S1, S2,   Abdomen - soft, nontender, nondistended,  Has  leg edema,     Labs:     Recent Labs      05/05/16   0335  05/04/16   0143   WBC  6.58  6.31   Hgb  15.1  15.4   Hematocrit  43.4  45.3   Platelets  140  123*   MCV  94.6  94.6     Recent Labs      05/05/16   0335  05/04/16   1028   Sodium  135*  137   Potassium  3.0*  3.6   Chloride  90*  91*   CO2  26  27   BUN  123.0*  123.0*   Creatinine  3.7*  4.2*   Glucose  104*  103*   Calcium  9.5  9.6     Recent Labs      05/03/16   1850   AST (SGOT)  67*  ALT  38   Alkaline Phosphatase  155*   Protein, Total  7.1   Albumin  3.3*     No results for input(s): PTT, PT, INR in the last 72 hours.                  Radiology Results (24 Hour)     ** No results found for the last 24 hours. Remus Blake, MD  05/05/2016  9:08 PM  [phone]

## 2016-05-05 NOTE — Plan of Care (Signed)
Problem: Physical Therapy  Goal: By discharge, patient will perform mobility at the  patient's highest functional potential. See PT evaluation for goals.  Outcome: Adequate for Discharge  Discharge Recommendation: Home with supervision  DME Recommended for Discharge: Other (Comment) (none)    Is an Occupational Therapy Evaluation Indicated at this time? No, the patient does not require a OT evaluation.    Treatment/Interventions: No skilled interventions needed at this time        Early/Progressive Mobility Protocol Level: Independent  Updated on Patient's Flip Chart in Room  (Please See Therapy Evaluation for device and assistance level needed)    Goals:   Goals  Goal Formulation: With patient  Time for Goal Acheivement: 1 visit  Goals: Select goal  Pt Will Perform Sit to Stand: independent, to maximize functional mobility and independence, Goal met  Pt Will Ambulate: > 200 feet, independent, to maximize functional mobility and independence, Goal met  Pt Will Perform Home Exer Program: independent, to maximize functional mobility and independence, Partly met  Other Goal: Pt will verbalize understanding of two strategies for improved safety with home mobility.  (Goal met. )

## 2016-05-05 NOTE — Consults (Signed)
PROGRESS NOTE    Date Time: 05/05/16 4:27 PM  Patient Name: Gregory [name], Gregory [name]    Please note this patient's cardiologist is Dr. Sunny Schlein from [facility] cardiology/[facility].  Patient is seen at the request of the ER physician for evaluation of congestive heart failure and elevated troponin I level.  Assessment:       1. Congestive heart failure; acute on chronic.  Systolic and diastolic dysfunction.  2. Severely depressed left ventricular systolic function; ejection fraction is 25-30 percent.  3. Biatrial enlargement, left ventricular hypertrophy and high E/A ratio, suggestive of restrictive pattern.  4. Tissue Doppler shows E/e ratio of more than 20, suggests elevated pulmonary capillary wedge pressure.  5. Patient carries   the diagnoses of cardiac amyloidosis and is being followed in [facility].  6. AK I/CK D.  7. Elevated troponin I level.    Plan:   1. Cardiac enzymes to peak.  2. Lipid panel  3. Thyroid function test  4. Nephrology evaluation  5. Aspirin 81 mg once a day  6. Lipitor 40 mg by mouth once a day  7. Not on beta blockers due to hypotension  8. Not on ACE inhibitor due to worsening renal failure  9. Advanced heart failure evaluation  10. Patient may need right heart catheterization to guide management decisions.  Subjective/chief complaint:   Patient is seen and examined  All medications and labs  reviewed    Medications:     Current Facility-Administered Medications   Medication Dose Route Frequency   . aspirin EC  81 mg Oral Daily   . fluticasone  2 spray Each Nare Daily   . heparin (porcine)  5,000 Units Subcutaneous Q12H [facility]   . predniSONE  20 mg Oral Daily       Review of Systems:   General ROS: no weight loss, no weight gain, no fever, no chills  Endocrine ROS: no fatigue, no polyuria, no polydipsia, no general weakness  Respiratory ROS:+ shortness of breath, no cough, no wheezes and no hemoptysis  Cardiovascular ROS: no chest pain, no palpitations, no PND and no orthopnea, +  DOE  Gastrointestinal ROS:no nausea, no vomiting, no diarrhea, no constipation, no blood in stool and no abdominal pain  Musculoskeletal ROS: no muscle pain, no muscle weakness, no joint pain, + swelling and no redness  Neurological ROS: no headache, no dizziness, no diplopia, no focal weakness, no seizure  Dermatological ROS: no rash, no itching and no ecchymoses, no pressure ulcer      Physical Exam:   BP 96/67   Pulse 90   Temp 97.6 F (36.4 C) (Oral)   Resp (!) 27   Ht 1.651 Adaira Centola (_0 )   Wt 63.6 kg (140 lb 3.4 oz)   SpO2 96%   BMI 23.33 kg/[place]ola Yuille       Intake/Output Summary (Last 24 hours) at 05/05/16 1627  Last data filed at 05/05/16 1400   Gross per 24 hour   Intake              200 ml   Output             2750 ml   Net            -2550 ml       General appearance - alert, well appearing, and in no distress  Mental status - alert, oriented to person, place, and time  HEENT:normocephalic, no icterus, no pallor, no cyanosis  Neck: supple, + JVD, no thyromegaly,  no carotid bruits  Chest - clear to auscultation, no wheezes, rales or rhonchi, symmetric air entry  Heart - normal rate, regular rhythm, normal S1, S2, no S3 ,+ murmurs, rubs, clicks or gallops  Abdomen - soft, nontender, nondistended, no masses or organomegaly  Neurological - alert, oriented, normal speech, no focal findings  noted  Musculoskeletal - no joint tenderness, deformity or swelling seen.  Extremities - peripheral pulses palpable++, pedal edema, no clubbing or cyanosis  Skin - normal color , no rashes.    Labs:     CBC w/Diff     Recent Labs  Lab 05/05/16  0335 05/04/16  0143 05/03/16  1850   WBC 6.58 6.31 5.60   Hgb 15.1 15.4 15.4   Hematocrit 43.4 45.3 45.2   Platelets 140 123* 136*          Basic Metabolic Profile     Recent Labs  Lab 05/05/16  0335 05/04/16  1028 05/04/16  0143  05/03/16  1850 04/29/16  0255   Sodium 135* 137 135* More results in Results Review 134* 131*   Potassium 3.0* 3.6 4.0 More results in Results Review 5.6* 5.1    Chloride 90* 91* 92* More results in Results Review 89* 91*   CO2 _0 More results in Results Review 27 22   BUN 123.0* 123.0* 126.0* More results in Results Review 130.0* 94.0*   Creatinine 3.7* 4.2* 4.7* More results in Results Review 5.1* 3.4*   Calcium 9.5 9.6 10.0 More results in Results Review 10.0 9.3   ALT  --   --   --   --  38 35   AST (SGOT)  --   --   --   --  67* 80*   Glucose 104* 103* 98 More results in Results Review 90 118*   More results in Results Review = values in this interval not displayed.       Cardiac Enzymes     Recent Labs  Lab 05/04/16  1028 05/04/16  0143 05/03/16  2232 05/03/16  1850   Troponin I 1.50* 1.54* 1.28* 1.53*       Lab Results   Component Value Date    BNP 6,908 (H) 05/03/2016    BNP 4,082 (H) 04/17/2016    BNP 1,378 (H) 08/26/2014          Thyroid Studies      Recent Labs  Lab 05/04/16  0143   Thyroid Stimulating Hormone 0.62           Lipid Profile            Radiology Results (24 Hour)     Procedure Component Value Units Date/Time    US Venous Up Extrem Duplex Dopp Uni Right [[id number]] Collected:  05/04/16 1708    Order Status:  Completed Updated:  05/04/16 1713    Narrative:       RIGHT UPPER EXTREMITY VENOUS DUPLEX ULTRASOUND    HISTORY: 70 year old male with right arm pain and swelling. Patient has  history of gout of the right hand. Clinical concern for deep venous  thrombosis.    COMPARISON: Right hand x-rays 04/29/2016.    TECHNIQUE: Duplex evaluation of the right upper extremity was performed  with gated Doppler, color flow and gray scale imaging techniques.    FINDINGS: The right innominate, subclavian, axillary, and internal  jugular veins are patent without thrombus or obstruction to venous flow.  There is no evidence of central venous obstruction. There  is normal  coaptation with transverse compression of the basilic and brachial veins  to the forearm. There is focal chronic appearing thrombus in the right  cephalic vein near the antecubital fossa and  the colon the remaining  right cephalic vein including through the cephalic arch is patent with  normal coaptation. The contralateral left internal jugular vein is  patent.      Impression:         1. Negative for DVT or central venous obstruction of the right upper  extremity.  2. Focal chronic appearing superficial thrombophlebitis of the right  cephalic vein near the antecubital fossa. The remaining right cephalic  vein is patent.    Maureen Ralphs, MD   05/04/2016 5:09 PM                  Vonzell Schlatter, MD  05/05/2016 4:27 PM

## 2016-05-05 NOTE — Progress Notes (Signed)
ICU Daily Progress Note        Date Time: 05/05/16 10:49 AM  Patient Name: Gregory [name]  Attending Physician: Evon Slack, MD  Room: [phone]   Admit Date: 05/03/2016  LOS: 2 days       ICU problem list: hyperkalemia, acute kidney injury, chronic kidney disease stage 4, cardiac amyloidosis, congestive heart failure    Assessment:   . Acute kidney injury on chronic kidney disease stage 4 (baseline creatinine ~2.9)  . Hyperkalemia  . Cardiac amyloidosis  . Congestive heart failure  . Cardio-renal syndrome  . Gout    Plan:   . Likely secondary to diuretics, hypotension and hypoperfusion. Associated azotemia. Mildly improved creatinine today. Continue PO hydration, holding diuretics, avoid hypotension.  . Secondary to the above and taking potassium supplement at home. UA w/ urine electrolytes pending. Will monitor.  .  Presently under the care of [facility] oncology. Usually takes revlimid 84m every other day, recently discussion with oncologist for 2.5 mg daily. On prednisone. Cardiology following, add oncology consult. Patient requesting case management consult as patient states he is interested in moving his amyloidosis management closer [place]  . Systolic and diastolic dysfunction, severe diffuse hypokinesis w/ LVEF 20-25% (05/05/16). On daily ASA, holding diuretics for now, diuresing well presently despite SBP on low side 85-102, avoid hypoperfusion to kidneys and may need gentle IVFB PRN. Follow BNP/cardiac enzymes, avoid pulmonary vascular congestion.  . Reports no home medication regimen. Pain management. Follow uric acid  . Heparin DVT prophylaxis.    From a [facility] standpoint [facility]  POC discussed with patient and nurse.    Code Status: full code    Subjective:   Review of Systems - reports sleeping well last night for several hours. Denies HA, dizziness, lightheadedness, SOB, CP, abdominal pain, N/V, altered  sensorium.    Medications:      Scheduled Meds: PRN Meds:      aspirin EC 81 mg Oral Daily   fluticasone 2 spray Each Nare Daily   heparin (porcine) 5,000 Units Subcutaneous Q12H [facility]  predniSONE 20 mg Oral Daily       Continuous Infusions:      acetaminophen 650 mg Q6H PRN   prochlorperazine 10 mg Q4H PRN         Physical Exam:   Capillary refill <2 seconds  General Appearance:  alert, well appearing, and in no distress, OOB as tolerated  Neuro:  alert, oriented, normal speech, no focal findings or movement disorder noted  Neck:supple, no significant adenopathy  Lungs: on RA, clear to auscultation, no wheezes, rales or rhonchi, symmetric air entry  Cardiac: NSR, normal rate, regular rhythm, normal S1, S2, no murmurs, rubs, clicks or gallops  Abdomen:  soft, nontender, nondistended, no masses or organomegaly  Extremities: peripheral pulses normal, +1 pedal edema, no clubbing or cyanosis   Skin:   normal coloration and turgor, no rashes, no suspicious skin lesions noted  GU:  voids  Data:     IBW/kg (Calculated) : 61.5    Patient Lines/Drains/Airways Status    Active PICC Line / CVC Line / PIV Line / Drain / Airway / Intraosseous Line / Epidural Line / ART Line / Line / Wound / Pressure Ulcer / NG/OG Tube     Name:   Placement date:   Placement time:   Site:   Days:    Peripheral IV 05/03/16 Left Antecubital  05/03/16  1848    Antecubital    1    Surgical Airway  05/04/16            1                VITAL SIGNS   Temp:  [96.6 F (35.9 C)-98.4 F (36.9 C)] 98.4 F (36.9 C)  Heart Rate:  [76-95] 81  BP: (85-104)/(56-75) 88/62  Blood Glucose:  Pulse ox:  Telemetry: normal sinus rhythm       Intake/Output Summary (Last 24 hours) at 05/05/16 1049  Last data filed at 05/05/16 1000   Gross per 24 hour   Intake              680 ml   Output             3200 ml   Net            -2520 ml        Labs:     CBC w/Diff CMP     Recent Labs  Lab 05/05/16  0335 05/04/16  0143 05/03/16  1850 04/29/16  0255   WBC 6.58 6.31 5.60  5.86   Hgb 15.1 15.4 15.4 15.4   Hematocrit 43.4 45.3 45.2 43.9   Platelets 140 123* 136* 139*   MCV 94.6 94.6 96.0 93.4   Neutrophils  --   --  83.5 68.3          PT/INR           Recent Labs  Lab 05/05/16  0335 05/04/16  1028 05/04/16  0143  05/03/16  1850 04/29/16  0255   Sodium 135* 137 135* More results in Results Review 134* 131*   Potassium 3.0* 3.6 4.0 More results in Results Review 5.6* 5.1   Chloride 90* 91* 92* More results in Results Review 89* 91*   CO2 _0 More results in Results Review 27 22   BUN 123.0* 123.0* 126.0* More results in Results Review 130.0* 94.0*   Creatinine 3.7* 4.2* 4.7* More results in Results Review 5.1* 3.4*   Glucose 104* 103* 98 More results in Results Review 90 118*   Calcium 9.5 9.6 10.0 More results in Results Review 10.0 9.3   Protein, Total  --   --   --   --  7.1 7.0   Albumin  --   --   --   --  3.3* 3.6   AST (SGOT)  --   --   --   --  67* 80*   ALT  --   --   --   --  38 35   Alkaline Phosphatase  --   --   --   --  155* 161*   Bilirubin, Total  --   --   --   --  3.5* 5.2*   More results in Results Review = values in this interval not displayed.    Recent Labs  Lab 05/04/16  1028 05/04/16  0143 05/03/16  2232   Troponin I 1.50* 1.54* 1.28*       _1 @   Glucose POCT     Recent Labs  Lab 05/05/16  0335 05/04/16  1028 05/04/16  0143 05/03/16  2232 05/03/16  1850 04/29/16  0255   Glucose 104* 103* 98 111* 90 118*        Rads:   Radiological Imaging personally reviewed.    I have personally reviewed the patient's history and 24  hour interval events, along with vitals, labs, radiology images.     So far today I have spent 35 critical care minutes providing care for this patient excluding teaching and billable procedures, and not overlapping with any other providers.    Signed by: Cathi Roan, ACNP-BC  Date/Time: 05/05/16 10:49 AM      Critical Care  Performed by: Paulita Cradle [name]  Authorized by: Paulita Cradle [name]   Total critical care time: 30  minutes  Critical care time was exclusive of separately billable procedures and treating other patients.  Critical care was necessary to treat or prevent imminent or life-threatening deterioration of the following conditions: renal failure, cardiac failure, circulatory failure and metabolic crisis.

## 2016-05-05 NOTE — PT Eval Note (Signed)
[facility]  Physical Therapy Evaluation and Treatment    Patient: Gregory [name]     MRN#: [id number]   Unit: [facility]  Bed: A2713/A2713-01    Time of Evaluation and Treatment:  Time Calculation  PT Received On: 05/05/16  Start Time: 1208  Stop Time: 1238  Time Calculation (min): 30 min    Chart Review and Collaboration with Care Team: 10 minutes  Evaluation Time: 10 minutes  Treatment Time: 20 minutes     PT Visit Number: 1    Consult received for Gregory [name] for PT Evaluation and Treatment.  Patient's medical condition is appropriate for Physical therapy intervention at this time.    Activity Orders: Activity as tolerated.     Precautions and Contraindications:   Precautions  Weight Bearing Status: no restrictions  Other Precautions: Fall    Medical Diagnosis: Hyperkalemia [E87.5]  Acute renal failure superimposed on stage 5 chronic kidney disease, not on chronic dialysis, unspecified acute renal failure type [N17.9, N18.5]    History of Present Illness: Gregory [name] is a 70 y.o. male admitted on 05/03/2016 with hyperkalemia found on routine blood work. Pt found to have AKI on CKD stage 4, hyperkalemia, and gout flare-up.     Patient Active Problem List   Diagnosis   . Ganglion cyst of wrist, right   . Primary localized osteoarthrosis, shoulder region   . Asthma without status asthmaticus   . Acute on chronic congestive heart failure, unspecified congestive heart failure type   . Elevated troponin   . Bilateral lower extremity edema   . Amyloidosis   . Hyperkalemia   . AKI (acute kidney injury)        Past Medical/Surgical History:  Past Medical History:   Diagnosis Date   . Arthritis     bilat shoulders, lt knee   . Asthma without status asthmaticus     childhood   . Cardiac amyloidosis    . CHF (congestive heart failure)    . Fracture of unspecified bones     1974 lt shoulder   . Gout    . Malignant neoplasm       Past Surgical History:   Procedure Laterality  Date   . ARTHROPLASTY, SHOULDER, TOTAL  06/18/2013    Procedure: ARTHROPLASTY, SHOULDER, TOTAL;  Surgeon: Wendi Snipes, MD;  Location: [facility];  Service: Orthopedics;  Laterality: Left;   . COLONOSCOPY     . EXCISION, GANGLION  12/19/2012    Procedure: EXCISION, GANGLION;  Surgeon: Jillyn Hidden, MD;  Location: [facility];  Service: Orthopedics;  Laterality: Right;  injection right and left shoulder   . HERNIA REPAIR      lt inguinal age 76   . INJECTION, MEDICATION  12/19/2012    Procedure: INJECTION, MEDICATION;  Surgeon: Jillyn Hidden, MD;  Location: [facility];  Service: Orthopedics;  Laterality: Bilateral;         X-Rays/Tests/Labs:  Lab Results   Component Value Date/Time    HGB 15.1 05/05/2016 03:35 AM    HCT 43.4 05/05/2016 03:35 AM    K 3.0 (L) 05/05/2016 03:35 AM    NA 135 (L) 05/05/2016 03:35 AM    INR 1.3 (H) 04/17/2016 01:11 PM    TROPI 1.50 (HH) 05/04/2016 10:28 AM    TROPI 1.54 (HH) 05/04/2016 01:43 AM    TROPI 1.28 (HH) 05/03/2016 10:32 PM    TROPI 1.53 (Short) 05/03/2016 06:50 PM  Imaging reviewed prior to evaluation, please refer to chart for further detail.     Social History:  Prior Level of Function  Prior level of function: Independent with ADLs, Ambulates independently  Baseline Activity Level: Household ambulation  DME Currently at Home: Other (Comment) (None)    Home Living Arrangements  Living Arrangements: Spouse/significant other, Children, Family members  Type of Home: House  Home Layout: Multi-level, Bed/bath upstairs, Performs ADL's on one level, Stairs to enter with rails (add number in comment) (6 STE)  DME Currently at Home: Other (Comment) (None)  Home Living - Notes / Comments: Pt reports family members are available to assist as needed following D/C.    Subjective: Patient is agreeable to participation in the therapy session. Nursing clears patient for therapy.   Patient Goal: To feel better and go home.   Pain Assessment  Pain Assessment: Numeric Scale  (0-10)  Pain Score:  (did not rate)  POSS Score: Awake and Alert  Pain Location: Hand;Foot;Toe (Comment which one) (L great toe, R hand, L foot)  Pain Frequency: Constant/continuous  Effect of Pain on Daily Activities: mild  Pain Intervention(s): Medication (See eMAR);Repositioned;Ambulation/increased activity    Objective:  Observation of Patient/Vital Signs:   Vitals:    05/05/16 1210   BP:    Pulse:    Resp:    Temp: 97.6 F (36.4 C)   SpO2:      VS assessed, resting BP in 80s/60s, per discussion with RN and chart review pt has been in 80s/50s-60s. Pt asymptomatic t/o session. Following amb BP up to 110/60s. HR in 80s-90s t/o.     Patient received seated in a bedside chair with telemetry  and SpO2 monitor in place.    Inspection/Posture: Pt with increased thoracic kyphosis and forward head posture, moderate pitting edema BLE.     Cognitive Status and Neuro Exam:  Cognition/Neuro Status  Arousal/Alertness: Appropriate responses to stimuli  Attention Span: Appears intact  Orientation Level: Oriented X4  Memory: Appears intact  Following Commands: Follows all commands and directions without difficulty  Safety Awareness: minimal verbal instruction  Insights: Fully aware of deficits;Educated in safety awareness  Problem Solving: Able to problem solve independently  Behavior: attentive;calm;cooperative  Motor Planning: intact  Coordination: intact      Musculoskeletal Examination  Gross ROM  Right Lower Extremity ROM: within functional limits  Left Lower Extremity ROM: within functional limits  Gross Strength  Right Lower Extremity Strength: within functional limits  Left Lower Extremity Strength: within functional limits       Functional Mobility:  Functional Mobility  Sit to Stand: Supervision;Increased Time;Increased Effort (instruction for technique, progressed to indep)  Stand to Sit: Independent     Locomotion  Ambulation: Supervision (instruction for technique, progressed to indep)  Ambulation Distance (Feet):  300 Feet  Pattern: shuffle;L foot decreased clearance;R foot decreased clearance;decreased cadence;decreased step length (increased lateral sway initially, improved by end of amb)     Balance  Balance  Balance: needs focused assessment  Sitting - Static: Good  Sitting - Dynamic: Good  Standing - Static: Good  Standing - Dynamic: Good (minus progressed to good)    Participation and Activity Tolerance  Participation and Endurance  Participation Effort: good  Endurance: Tolerates 10 - 20 min exercise with multiple rests  [place] Dyspnea Scale: 0 Dyspnea    Educated the patient to role of physical therapy, plan of care, goals of therapy and HEP, safety with mobility and ADLs, energy conservation techniques,  discharge instructions, home safety.   Patient left in bedside chair with BP cuff, SpO2 monitor in place and call bell and all personal items/needs within reach. RN notified of session outcome.     Assessment: Gregory [name] is a 70 y.o. male admitted 05/03/2016.  Pt's functional mobility is impacted by:  decreased activity tolerance and gait impairment.  There are a few comorbidities or other factors that affect plan of care and require modification of task including: has stairs to manage and recurrent gout.  Standardized tests and exams incorporated into evaluation include AMPAC mobility, balance, ROM  and Strength.  Pt demonstrates a stable clinical presentation. Pt is demonstrating safe and indep functional mobility and good awareness of deficits following PT evaluation and treatment. No further skilled physical therapy services are recommended in acute care or following D/C, unless pt functional status changes.     Complexity Level Hx and Co  morbidites Examination Clinical Decision Making Clinical Presentation   Low no impact 1-2 elements Limited options Stable     Treatment: Pt sound asleep upon PT arrival, several attempts required to arouse pt however pt alert and participatory t/o session after  being awoken. Instruction for posture, increased positional acclimation time, self-assessment of symptoms, safety when managing LE/foot cramping, gradual return to normal activities with increased rest breaks PRN, and assistance from family for safety PRN. Functional mobility improved with increased pt alertness and practice with ambulation. Stressed importance of continued OOB activity during hospital stay and following D/C. Reviewed PT plan of care and D/C recommendation. Reinforced importance of OOB activity and amb for improved functional outcomes, with emphasis on use of nursing assistance for safety. Reinforced use of call bell. Pt verbalized understanding and agreement with plan. Addressed all pt questions and concerns.    Plan: Treatment/Interventions: No skilled interventions needed at this time       Risks/Benefits/POC Discussed with Pt/Family: With patient     G codes: yes  Mobility, Current Status (G5498): CJ  Mobility, Goal Status (Y6415): CI  Mobility, D/C Status (A3094): CJ    Goals:   Goals  Goal Formulation: With patient  Time for Goal Acheivement: 1 visit  Goals: Select goal  Pt Will Perform Sit to Stand: independent, to maximize functional mobility and independence, Goal met  Pt Will Ambulate: > 200 feet, independent, to maximize functional mobility and independence, Goal met  Pt Will Perform Home Exer Program: independent, to maximize functional mobility and independence, Partly met  Other Goal: Pt will verbalize understanding of two strategies for improved safety with home mobility.  (Goal met. )    AM-PAC:yes        PT Basic Mobility Raw Score: 22  CMS 0-100% Score: 20.91%                 DME Recommended for Discharge: Other (Comment) (none)  Discharge Recommendation: Home with supervision    Veverly Fells PT, DPT x [phone]  05/05/2016 12:44 PM

## 2016-05-05 NOTE — Consults (Signed)
Gregory [name]  [phone]    Patient Name: [name] Day: 2     History of Present Illness:   Gregory [name] is a 70 y.o. male who is followed by Dr. Yong Channel at [facility] for treatment of AL cardiac amyloid.  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  He was treated with 10 cycles CyBorD, completed in August 2016.  He has had ongoing issues with CHF exacerbation and progressive renal failure.  He was last seen in clinic recently with plan to initiate revlimid 5 mg QOD with dexamethasone 20 mg weekly.      He was admitted 04/02/16 with hyperkalemia and progressive renal failure.  Currently denies dyspnea, chest pain.      Past Medical History:     Past Medical History:   Diagnosis Date   . Arthritis     bilat shoulders, lt knee   . Asthma without status asthmaticus     childhood   . Cardiac amyloidosis    . CHF (congestive heart failure)    . Fracture of unspecified bones     1974 lt shoulder   . Gout    . Malignant neoplasm      Past Surgical History:   Procedure Laterality Date   . ARTHROPLASTY, SHOULDER, TOTAL  06/18/2013    Procedure: ARTHROPLASTY, SHOULDER, TOTAL;  Surgeon: Wendi Snipes, MD;  Location: [facility];  Service: Orthopedics;  Laterality: Left;   . COLONOSCOPY     . EXCISION, GANGLION  12/19/2012    Procedure: EXCISION, GANGLION;  Surgeon: Jillyn Hidden, MD;  Location: [facility];  Service: Orthopedics;  Laterality: Right;  injection right and left shoulder   . HERNIA REPAIR      lt inguinal age 77   . INJECTION, MEDICATION  12/19/2012    Procedure: INJECTION, MEDICATION;  Surgeon: Jillyn Hidden, MD;  Location: [facility];  Service: Orthopedics;  Laterality: Bilateral;       FamilyHistory:     Family History   Problem Relation Age of Onset   . Family history unknown: Yes       Social History:     Social History     Social History   . Marital status:  Married     Spouse name: N/A   . Number of children: N/A   . Years of education: N/A     Social History Main Topics   . Smoking status: Never Smoker   . Smokeless tobacco: Never Used   . Alcohol use Yes      Comment: rare   . Drug use: No   . Sexual activity: No     Other Topics Concern   . Not on file     Social History Narrative   . No narrative on file       Allergies:   No Known Allergies    Medications:   Scheduled Meds:  Current Facility-Administered Medications   Medication Dose Route Frequency   . aspirin EC  81 mg Oral Daily   . fluticasone  2 spray Each Nare Daily   . heparin (porcine)  5,000 Units Subcutaneous Q12H [facility]   . predniSONE  20 mg Oral Daily     Continuous Infusions:   PRN Meds:.acetaminophen, prochlorperazine    Review of Systems:   Otherwise negative  _0 General: _1  Negative _2  Weight loss _3  Weight gain _4   Fever _0  Night sweats _1  Chills     _2 Respiratory: _3  Negative _4  Dyspnea _5  Cough _6  Wheezing _7  Hemoptysis    _8 Cardiac: _9  Negative _10  Chest pain _11  Palpitations _12  Orthopnea _13  Paroxysmal nocturnal dyspnea    _14 Gastrointestinal: _15  Negative _16  Abdominal pain _17  Nausea _18  Vomiting _19  Diarrhea   _20  Constipation _21  Rectal bleeding  _22  Change in bowel habits    _23 Genitourinary: _24  Negative _25  Dysuria _26  Increased urine frequency _27  Incontinence _28  Hematuria _29  Incomplete emptying    _30 CNS: _31  Negative _32  Neuropathy _33  Unsteady gait _34  Seizure _35  Encephalopathy    _36 Endocrine: _37  Negative _38  Fatigue _39  Polyuria _40  Polydipsia _41  General weakness    _42 Skin: _43  Negative _44  Rash _45  Pruritus _46  Ecchymosis _47  Pressure ulcer    _48 Musculoskeletal: _49  Muscle ache    Objective:   Physical Exam:  VITALS:   Vitals:    05/05/16 1400   BP: 96/67   Pulse: 90   Resp:    Temp:    SpO2: 96%     Intake/Output:   Intake/Output Summary (Last 24 hours) at 05/05/16 1554  Last data filed at 05/05/16 1400   Gross per 24 hour   Intake              320 ml   Output             2750 ml   Net            -2430 ml        GENERAL: No acute distress  HEENT: Sclera anicteric  HEART: Regular rate and rhythm.    LUNG: Clear to ascultation bilaterally.   ABDOMEN: Soft, nontender, nondistended.    EXTREMITIES: Warm and well perfused.  2-3+ pitting edema bilaterally      Labs:     Results     Procedure Component Value Units Date/Time    Basic Metabolic Panel [[id number]]  (Abnormal) Collected:  05/05/16 0335    Specimen:  Blood Updated:  05/05/16 0514     Glucose 104 (H) mg/dL      BUN 123.0 (H) mg/dL      Creatinine 3.7 (H) mg/dL      Calcium 9.5 mg/dL      Sodium 135 (L) mEq/L      Potassium 3.0 (L) mEq/L      Chloride 90 (L) mEq/L      CO2 26 mEq/L      Anion Gap 19.0 (H)    Hemolysis index [[id number]]  (Abnormal) Collected:  05/05/16 0335     Updated:  05/05/16 0514     Hemolysis Index 54 (H)    GFR [[id number]] Collected:  05/05/16 0335     Updated:  05/05/16 0514     EGFR 19.8    CBC without differential [[id number]]  (Abnormal) Collected:  05/05/16 0335    Specimen:  Blood from Blood Updated:  05/05/16 0431     WBC 6.58 x10 3/uL      Hgb 15.1 g/dL      Hematocrit 43.4 %      Platelets 140 x10 3/uL      RBC 4.59 (L) x10 6/uL      MCV 94.6 fL      MCH 32.9 (H) pg      MCHC 34.8 g/dL      RDW 14 %      MPV 11.8 fL      Nucleated RBC 0.0 /100 WBC      Absolute NRBC  0.00 x10 3/uL     Sodium, urine, random [[id number]] Collected:  05/04/16 2208    Specimen:  Urine Updated:  05/05/16 0417     Sodium, UR 83 mEq/L     Potassium, urine, random [[id number]] Collected:  05/04/16 2208    Specimen:  Urine Updated:  05/05/16 0417     Potassium, Urine 21.3 mEq/L     Chloride, urine, random [[id number]] Collected:  05/04/16 2208    Specimen:  Urine Updated:  05/05/16 0417     Chloride, UR 95 mEq/L     MRSA culture (If not done in Triage) [[id number]] Collected:  05/03/16 2314    Specimen:  Body Fluid from Nasal/Throat ASC Admission Updated:  05/04/16 2337    Narrative:       ORDER#: [id number]                                    ORDERED BY: WHITE,  SUSAN  SOURCE: Nares and Throat                             COLLECTED:  05/03/16 23:14  ANTIBIOTICS AT COLL.:                                RECEIVED :  05/04/16 05:01  Culture MRSA Surveillance                  FINAL       05/04/16 23:36  05/04/16   Negative for Methicillin Resistant Staph aureus from Nares and             Negative for Methicillin Resistant Staph aureus from Throat      Urinalysis with microscopic [[id number]]  (Abnormal) Collected:  05/04/16 2208    Specimen:  Urine Updated:  05/04/16 2238     Urine Type Clean Catch     Color, UA Straw     Clarity, UA Clear     Specific Gravity UA 1.009     Urine pH 6.0     Leukocyte Esterase, UA Negative     Nitrite, UA Negative     Protein, UR 30 (A)     Glucose, UA Negative     Ketones UA Negative     Urobilinogen, UA Negative mg/dL      Bilirubin, UA Negative     Blood, UA Small (A)     RBC, UA 0 - 2 /hpf      WBC, UA 0 - 5 /hpf      Hyaline Casts, UA 0 - 3 /lpf     Creatinine, urine, random [[id number]] Collected:  05/04/16 2208    Specimen:  Urine Updated:  05/04/16 2233     Urine Creatinine, Random 24.5 mg/dL            Imaging Data:     Radiology Results (24 Hour)     Procedure Component Value Units Date/Time    US Venous Up Extrem Duplex Dopp Uni Right [[id number]] Collected:  05/04/16 1708    Order Status:  Completed Updated:  05/04/16 1713    Narrative:       RIGHT UPPER EXTREMITY VENOUS DUPLEX ULTRASOUND    HISTORY: 70 year old male with right arm pain and swelling. Patient has  history of gout of the right hand. Clinical concern  for deep venous  thrombosis.    COMPARISON: Right hand x-rays 04/29/2016.    TECHNIQUE: Duplex evaluation of the right upper extremity was performed  with gated Doppler, color flow and gray scale imaging techniques.    FINDINGS: The right innominate, subclavian, axillary, and internal  jugular veins are patent without thrombus or obstruction to venous flow.  There is no evidence of central venous obstruction. There is  normal  coaptation with transverse compression of the basilic and brachial veins  to the forearm. There is focal chronic appearing thrombus in the right  cephalic vein near the antecubital fossa and the colon the remaining  right cephalic vein including through the cephalic arch is patent with  normal coaptation. The contralateral left internal jugular vein is  patent.      Impression:         1. Negative for DVT or central venous obstruction of the right upper  extremity.  2. Focal chronic appearing superficial thrombophlebitis of the right  cephalic vein near the antecubital fossa. The remaining right cephalic  vein is patent.    Maureen Ralphs, MD   05/04/2016 5:09 PM            Assessment/Plan:     Gregory [name] is a 70 y.o. male with AL cardiac amyloid, s/p CyBorD, most recently started on low dose revlimid and dexamethasone.    1.  Cardiac amyloid.  Patient content with current oncology management.  He was advised to continue to maintain follow-up with Dr. Yong Channel.  Would hold revlimid and dexamethasone until he follows up with his primary oncologist.    2.  CKD.  Nephrology following.    3.  Disposition.  Per primary team.  No further oncology recommendations.  Will sign off.  Please call if we can assist further.             Darrelyn Hillock, MD  [place] [name]  P: [phone]  F: [phone]

## 2016-05-06 DIAGNOSIS — R748 Abnormal levels of other serum enzymes: Secondary | ICD-10-CM

## 2016-05-06 DIAGNOSIS — I43 Cardiomyopathy in diseases classified elsewhere: Secondary | ICD-10-CM

## 2016-05-06 DIAGNOSIS — N179 Acute kidney failure, unspecified: Secondary | ICD-10-CM

## 2016-05-06 DIAGNOSIS — E8581 Light chain (AL) amyloidosis: Secondary | ICD-10-CM

## 2016-05-06 DIAGNOSIS — E854 Organ-limited amyloidosis: Secondary | ICD-10-CM

## 2016-05-06 DIAGNOSIS — I5043 Acute on chronic combined systolic (congestive) and diastolic (congestive) heart failure: Secondary | ICD-10-CM

## 2016-05-06 LAB — BASIC METABOLIC PANEL
Anion Gap: 18 — ABNORMAL HIGH (ref 5.0–15.0)
BUN: 121 mg/dL — ABNORMAL HIGH (ref 9.0–28.0)
CO2: 29 mEq/L (ref 22–29)
Calcium: 9.3 mg/dL (ref 8.5–10.5)
Chloride: 91 mEq/L — ABNORMAL LOW (ref 100–111)
Creatinine: 3.1 mg/dL — ABNORMAL HIGH (ref 0.7–1.3)
Glucose: 123 mg/dL — ABNORMAL HIGH (ref 70–100)
Potassium: 2.8 mEq/L — CL (ref 3.5–5.1)
Sodium: 138 mEq/L (ref 136–145)

## 2016-05-06 LAB — CBC
Absolute NRBC: 0 10*3/uL
Hematocrit: 46.9 % (ref 42.0–52.0)
Hgb: 16.3 g/dL (ref 13.0–17.0)
MCH: 32.7 pg — ABNORMAL HIGH (ref 28.0–32.0)
MCHC: 34.8 g/dL (ref 32.0–36.0)
MCV: 94 fL (ref 80.0–100.0)
MPV: 11.7 fL (ref 9.4–12.3)
Nucleated RBC: 0 /100 WBC (ref 0.0–1.0)
Platelets: 130 10*3/uL — ABNORMAL LOW (ref 140–400)
RBC: 4.99 10*6/uL (ref 4.70–6.00)
RDW: 14 % (ref 12–15)
WBC: 7.55 10*3/uL (ref 3.50–10.80)

## 2016-05-06 LAB — URIC ACID: Uric acid: 16.4 mg/dL — ABNORMAL HIGH (ref 3.6–9.7)

## 2016-05-06 LAB — PROTEIN / CREATININE RATIO, URINE
Urine Creatinine, Random: 37.5 mg/dL
Urine Protein Random: 26.3 mg/dL — ABNORMAL HIGH (ref 1.0–14.0)
Urine Protein/Creatinine Ratio: 0.7

## 2016-05-06 LAB — GFR: EGFR: 24.3

## 2016-05-06 LAB — HEMOLYSIS INDEX
Hemolysis Index: 24 — ABNORMAL HIGH (ref 0–18)
Hemolysis Index: 21 — ABNORMAL HIGH (ref 0–18)

## 2016-05-06 MED ORDER — OXYCODONE-ACETAMINOPHEN 5-325 MG PO TABS
1.0000 | ORAL_TABLET | ORAL | Status: DC | PRN
Start: 2016-05-06 — End: 2016-05-13
  Administered 2016-05-06 – 2016-05-07 (×2): 1 via ORAL
  Filled 2016-05-06 (×2): qty 1

## 2016-05-06 MED ORDER — BUMETANIDE 0.25 MG/ML IJ SOLN
1.0000 mg | Freq: Once | INTRAMUSCULAR | Status: AC
Start: 2016-05-06 — End: 2016-05-06
  Administered 2016-05-06: 1 mg via INTRAVENOUS
  Filled 2016-05-06 (×2): qty 4

## 2016-05-06 MED ORDER — POTASSIUM CHLORIDE CRYS ER 20 MEQ PO TBCR
40.0000 meq | EXTENDED_RELEASE_TABLET | Freq: Two times a day (BID) | ORAL | Status: DC
Start: 2016-05-06 — End: 2016-05-06

## 2016-05-06 MED ORDER — POTASSIUM CHLORIDE CRYS ER 20 MEQ PO TBCR
40.0000 meq | EXTENDED_RELEASE_TABLET | Freq: Two times a day (BID) | ORAL | Status: AC
Start: 2016-05-06 — End: 2016-05-07
  Administered 2016-05-06 – 2016-05-07 (×4): 40 meq via ORAL
  Filled 2016-05-06 (×5): qty 2

## 2016-05-06 NOTE — Plan of Care (Signed)
Problem: Hemodynamic Status: Cardiac  Goal: Stable vital signs and fluid balance  Outcome: Progressing  Patient's kidney function tests are lowering. MD aware. Feet and legs continue to have edema +2 legs and feet, pitting edema. Will continue to monitor.     Problem: Heart Failure  Goal: Stable vital signs and fluid balance  Outcome: Progressing  Patient's kidney function tests are lowering. MD aware. Feet and legs continue to have edema +2 legs and feet, pitting edema. Will continue to monitor.

## 2016-05-06 NOTE — Progress Notes (Signed)
Canary Brim, MD  [facility]  Tel:  [phone]      CC:  Follow-up for CHF    Assessment And Plan :     1. Chronic systolic HF - D/w Advanced HF team, agree with right heart cath for evaluation of filling pressures and cardiac index. D/w pt and he is agreeable. Scheduled for tomorrow, keep NPO after midnight. Continue with IV diuresis, metolazone, input/ output charting/ daily weights.  2. Cardiac amyloidosis - AL/ NICM EF 20 to 25% - Follows at [facility] - treatment with CyBorD therapy, recent relapse with therapy now with revlimid/ dexamethasone. Cardiac cath 02/2014 at [facility] - report scanned into EPIC - non obstructive CAD - LAD  prox 20%, D2 - small vessel 70%, RCA - small, non dominant 90%. Low dose bisoprolol started by HF team. No ACE inh/ aldactone at this time due to AKI.  3. AKI on CKD - Nephrology following          Cardiology Diagnostics         Telemetry:  (I have personally reviewed the telemetry strips)    Sinus, no events      Chest -Xray: this admission (I have personally reviewed images) No acute process        Echocardiogram: 04/2016 ( I have personally reviewed the report)    -  Left ventricle:  -  Systolic function was severely reduced. Ejection fraction was estimated in  the range of 20 % to 25 %.  -  There was severe diffuse hypokinesis.  -  Wall thickness was moderately to markedly increased.  -  Tissue doppler shows E/e to 20 , suggestive of diastolic dysfunction and  elevated PCWP.    -  Mitral valve:  -  There was mild regurgitation.    -  Left atrium:  -  The atrium was dilated.    -  Right ventricle:  -  Systolic function was reduced.    -  Tricuspid valve:  -  There was mild regurgitation.    -  Right atrium:  -  The atrium was dilated.                Problem list     Patient Active Problem List   Diagnosis   . Ganglion cyst of wrist, right   . Primary localized osteoarthrosis, shoulder region   . Asthma without status asthmaticus   . Acute on chronic  systolic and diastolic heart failure, NYHA class 4   . Elevated troponin   . Bilateral lower extremity edema   . Light chain (AL) amyloidosis   . Hyperkalemia   . AKI (acute kidney injury)   . Cardiac amyloidosis               HPI and Events since last 24 hrs:       70 year old man with cardiac amyloid, NICM EF 20 to 25%, AKI on CKD admitted with AKI, abnormal troponin.    No complaints today. Denies chest pain/ palpitations/ SOB/ dizziness.      Objective:   Vitals reviewed     VS: BP 93/62   Pulse 93   Temp (!) 96 F (35.6 C)   Resp 18   Ht 1.651 m (_0 )   Wt 62.4 kg (137 lb 9.1 oz)   SpO2 94%   BMI 22.89 kg/m     Intake/Output Summary (Last 24 hours) at 05/06/16 1535  Last data filed at 05/06/16  0700   Gross per 24 hour   Intake              320 ml   Output                0 ml   Net              320 ml         General Appearance: Breathing comfortable, no acute distress   Head: normocephalic   Eyes: EOM's intact, nonicteric sclera   Neck:decreased air entry in bases, no significant rales nor wheeze  Cardiac: RRR, normal S1, S2, no S3, no S4, no rub, no murmurs   Abdomen: Soft, non-tender, positive bowel sounds   Extremities: No cyanosis or clubbing. bilateral pedal edema.  Vascular: 2+ radial and distal pulses bilaterally   Neurologic: Alert and oriented x 3    Laboratory Studies:   (I have personally reviewed the laboratory values below)      CBC w/Diff     Recent Labs  Lab 05/06/16  0545 05/05/16  0335 05/04/16  0143   WBC 7.55 6.58 6.31   Hgb 16.3 15.1 15.4   Hematocrit 46.9 43.4 45.3   Platelets 130* 140 123*          Basic Metabolic Profile     Recent Labs  Lab 05/06/16  0940 05/05/16  0335 05/04/16  1028   Sodium 138 135* 137   Potassium 2.8* 3.0* 3.6   Chloride 91* 90* 91*   CO2 _0 BUN 121.0* 123.0* 123.0*   Creatinine 3.1* 3.7* 4.2*   EGFR 24.3 19.8 17.1   Glucose 123* 104* 103*   Calcium 9.3 9.5 9.6            Cardiac Enzymes     Recent Labs  Lab 05/04/16  1028 05/04/16  0143  05/03/16  2232   Troponin I 1.50* 1.54* 1.28*          Thyroid Studies     Recent Labs  Lab 05/04/16  0143   Thyroid Stimulating Hormone 0.62          Cholesterol Panel            Coagulation Studies                Current Medications   Meds reviewed:    Current Facility-Administered Medications   Medication Dose Route Frequency   . aspirin EC  81 mg Oral Daily   . fluticasone  2 spray Each Nare Daily   . heparin (porcine)  5,000 Units Subcutaneous Q12H [facility]   . potassium chloride  40 mEq Oral Q12H [facility]   . predniSONE  20 mg Oral Daily            More than 40 minutes spent. Records from [place] reviewed.  D/w nursing.  Patient's medical record and labs have been reviewed.     Kathleene Hazel, MD  05/06/2016 3:35 PM

## 2016-05-06 NOTE — Plan of Care (Signed)
Problem: Hemodynamic Status: Cardiac  Goal: Stable vital signs and fluid balance  Outcome: Progressing   05/06/16 0035   Goal/Interventions addressed this shift   Stable vital signs and fluid balance Monitor/assess vital signs and telemetry per unit protocol;Weigh on admission and record weight daily;Assess signs and symptoms associated with cardiac rhythm changes;Monitor lab values;Monitor for leg swelling/edema and report to LIP if abnormal       Problem: Impaired Mobility  Goal: Mobility/Activity is maintained at optimal level for patient  Outcome: Progressing   05/06/16 0035   Goal/Interventions addressed this shift   Mobility/activity is maintained at optimal level for patient Increase mobility as tolerated/progressive mobility;Encourage independent activity per ability;Reposition patient every 2 hours and as needed unless able to reposition self;Assess for changes in respiratory status, level of consciousness and/or development of fatigue       Problem: Nutrition  Goal: Nutritional intake is adequate  Outcome: Progressing   05/06/16 0035   Goal/Interventions addressed this shift   Nutritional intake is adequate Monitor daily weights;Assist patient with meals/food selection;Allow adequate time for meals;Encourage/perform oral hygiene as appropriate;Encourage/administer dietary supplements as ordered (i.e. tube feed, TPN, oral, OGT/NGT, supplements)       Problem: Renal Instability  Goal: Perineal skin integrity is maintained or improved  Outcome: Progressing    Goal: Nutritional intake is adequate  Outcome: Progressing   05/06/16 0035   Goal/Interventions addressed this shift   Nutritional intake is adequate Monitor daily weights;Assist patient with meals/food selection;Allow adequate time for meals;Encourage/perform oral hygiene as appropriate;Encourage/administer dietary supplements as ordered (i.e. tube feed, TPN, oral, OGT/NGT, supplements)       Problem: Heart Failure  Goal: Stable vital signs and fluid  balance  Outcome: Progressing   05/06/16 0035   Goal/Interventions addressed this shift   Stable vital signs and fluid balance Monitor/assess vital signs and telemetry per unit protocol;Weigh on admission and record weight daily;Assess signs and symptoms associated with cardiac rhythm changes;Monitor lab values;Monitor for leg swelling/edema and report to [facility] if abnormal     Goal: Mobility/Activity is maintained at optimal level for patient  Outcome: Progressing   05/06/16 0035   Goal/Interventions addressed this shift   Mobility/activity is maintained at optimal level for patient Increase mobility as tolerated/progressive mobility;Encourage independent activity per ability;Reposition patient every 2 hours and as needed unless able to reposition self;Assess for changes in respiratory status, level of consciousness and/or development of fatigue     Goal: Nutritional intake is adequate  Outcome: Progressing   05/06/16 0035   Goal/Interventions addressed this shift   Nutritional intake is adequate Monitor daily weights;Assist patient with meals/food selection;Allow adequate time for meals;Encourage/perform oral hygiene as appropriate;Encourage/administer dietary supplements as ordered (i.e. tube feed, TPN, oral, OGT/NGT, supplements)

## 2016-05-06 NOTE — Consults (Signed)
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   Initial response to CyBorD therapy; however, patient has relapsed and recently started revlimid 5 mg PO QOD and dexamethasone 20 mg weekly   Acute on chronic systolic and diastolic heart failure   Restrictive cardiac physiology   Acute on chronic renal failure, stage V   Hyperkalemia (treated)   Hyponatremia    Acute gout flare    Recommendations:     Continue diuresis with bumetanide 6 mg IV daily at 0800 and 1400   Continue metolazone 5 mg, though would do daily at this point    Fluid restrict to 1.5 L/day and Na 2 gm/day   Daily standing weights   Start bisoprolol 2.5 mg PO Q HS   No ACE/ARB/ARNI due to severity of renal failure and relative hypotension   No spironolactone due to severity of renal failure   Continue Revlimid; holding dexamethasone while patient is on prednisone   Prednisone taper for treatment of gout   Activity as tolerated   Consider RHC to better define the severity of his restrictive physiology, his filling pressures and cardiac index. This is particularly important if there is consideration for HD   I am concerned that the patient will not tolerate HD (if indicated) due to his restrictive physiology and hypotension   I have recommended a family meeting to discuss prognosis, goals of care, and overall limited treatment options.    I have also advocated that patient consider changing care to local MDs as to improve continuity of care, particularly when patient is hospitalized      ---------------------------------------------------------------------------------------------------------  CC:  AL cardiac amyloidosis    HPI:  Patient is a 70 yr old AA male who was diagnosed with AL amyloidosis in  December 2015 when he presented with decompensated heart failure. He was started on CyBorD in January 2016 and had a good response for a total of ten cycles (completed 02/09/15). He was hospitalized on 08/26/14 with syncope, which was felt to be due to inability to augment cardiac output with exertion due to the severity of his cardiomyopathy. He has since relapsed (increased serum free light chains from 35 in October 2017 to 378 in October 2017), and treatment was restarted on 04/09/16 with revlimid 5 mg QOD and dexamethasone 20 mg weekly.    The patient was admitted at [facility] 10/13-10/17/17 for decompensated heart failure and gout flare affecting his right hand. He underwent diuresis and the discharge summary notes that he was without leg edema. His goal weight at home is around 135 lbs. He was discharged on bumetanide 6 mg PO BID and metolazone 5 mg on Sun, Tues, and Thurs if weight > 135 lbs.     He was hospitalized at [facility] 11/1-11/2/17 for acute decompensated heart failure. In reviewing records and given the short hospital stay, it appears that the patient was not fully optimized from a volume standpoint, and was discharged still with leg edema. His BNP on 04/17/16 was 4082.    The patient was seen in the ED on 04/21/16 and 04/29/16 for bilateral hand pain. The working diagnosis was gout (04/21/16) and cellulitis (04/29/16), though for reasons unclear, he has received two courses of antibiotics instead of steroids, or colchicine.  Uric acid was checked on 04/29/16 and was 13.3.    The patient was seen by Dr Yong Channel on 05/03/16 for routine follow-up, and was called by her office when labs were resulted, as his BUN, creatinine and K were all elevated. The patient was admitted for acute renal failure and hyperkalemia. Of note, his creatinine on 04/05/16 was 2.7, corresponding to an eGFR of 20-25. His total bilirubin has been chronically elevated around 3-4.0. He has been seen twice in the [facility] ED for  bilateral hand pain and swelling, which most likely is due to gout (uric acid is 15.4).    When I saw the patient, he was sitting up in a chair; there were no family members present and he could not reach his wife by phone. He reports chronic leg edema, decreased appetite, early satiety, inability to climb one flight of stairs, and he has been sleeping in a recliner for months. He states that he does not get out of the house and does not do much at home, as his family "doesn't let me do anything". While he does not report it to me today, notes from [facility] states he's had chronic dyspnea, fatigue, and that his weight on 04/23/16 was 149 lbs due to worsening edema. He apparently has had the hand pain for weeks, indicating that it has never fully resolved. I noticed today that the patient pays attention to particular details which are tangential to conversation, raising the question of how much insight he has into his illness. He cannot recall any of his medications, and defers to his wife and niece (who is a Marine scientist) for many of my questions.    [name] oncologist is Dr Karen Kays and his cardiologist is Dr Cristy Folks (he was previously followed by Dr Jerilee Field). His nephrologist is Dr Marcy Siren.    Discussed with Dr Fredia Beets    ROS:  All other systems were reviewed and are otherwise negative except as noted in the HPI.    Consult Requested by:  Dr Laddie Aquas    Patient Active Problem List   Diagnosis   . Ganglion cyst of wrist, right   . Primary localized osteoarthrosis, shoulder region   . Asthma without status asthmaticus   . Acute on chronic congestive heart failure, unspecified congestive heart failure type   . Elevated troponin   . Bilateral lower extremity edema   . Amyloidosis   . Hyperkalemia   . AKI (acute kidney injury)     Current Meds:    aspirin EC 81 mg Oral Daily   fluticasone 2 spray Each Nare Daily   heparin (porcine) 5,000 Units Subcutaneous Q12H [facility]   predniSONE 20 mg Oral Daily      Drips:     He has No Known Allergies.     Family history: there is no family history of premature coronary artery disease, nor sudden cardiac death or cardiomyopathy.    Social history: the patient is married and lives with his wife. 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 He does not smoke or drink ETOH.     Objective:    Vital signs in last 24 hours:   Temp:  [95.3 F (35.2 C)-98.4 F (36.9 C)] 96.6 F (35.9 C)  Heart Rate:  [79-95] 93  Resp Rate:  [18] 18  BP: (81-100)/(56-73) 97/73  SpO2: 97 %O2 Device: None (Room air)  Height: 165.1 cm (_0 )  Weight: 62.4  kg (137 lb 9.1 oz); Weight change:   Body mass index is 22.89 kg/m.[name]    Telemetry: sinus at 87 BPM    I/O last 3 completed shifts:  In: 59 [P.O.:520]  Out: 2500 [Urine:2500]                     Physical Exam:   General:  alert, appears stated age, cooperative and no distress  HEENT:  Conjunctiva pale, bucal mucosa moist.  JVD - 4 cm above sternal notch, no adenopathy, no carotid bruit and supple, symmetrical, trachea midline.   Lungs:  clear to auscultation bilaterally  Cardiovascular:  regular rate and rhythm, S1, S2 normal, no murmur, click, rub or gallop  Abdominal:  soft, non-tender; bowel sounds normal; no masses,  no organomegaly.  Hepatojular reflux is present.  Extremities:  edema 3+ pitting edema of the legs bilaterally.  Extremities are cool to the touch.    Neuromuscular exam:  Grossly non-focal though not formally tested.  Normal muscle tone.  Patient able to perform PT and OT.    Cardiac Studies:  Echocardiogram on 05/04/16 shows a left ventricular end-diastolic dimension of 33 mm, concentric LVH (walls 15 mm), global HK with a left ventricular ejection fraction of 20-25%, and grade 3 diastolic dysfunction.  The right ventricle is dilated with reduced systolic function.  The left atrium is dilated and the right atrium is dilated.  The aortic valve is trileaflet without evidence of  stenosis; there is trivial insufficiency.  The mitral valve is morphologically normal with mild regurgitation.  There is mild tricuspid regurgitation.  Pulmonary systolic pressure is estimated to be 30 mmHg above right atrial pressure (10-15 mmHg).  There is trivial pulmonic insufficiency.  A pericardial effusion is not present.    ECG Findings on 05/03/16:  sinus rhythm, left anterior fascicular block and inferior MI, low voltage.  As compared to his previous ECG on 04/17/16 there is no no significant change.      Right heart catheterization none    Imaging:  Chest X-Ray: 05/03/16 (images reviewed) no acute process (no pulmonary edema, pleural effusions or air space disease.    Lab Results   Component Value Date    WBC 7.55 05/06/2016    HGB 16.3 05/06/2016    PLT 130 (L) 05/06/2016    NA 135 (L) 05/05/2016    K 3.0 (L) 05/05/2016    BUN 123.0 (H) 05/05/2016    CREAT 3.7 (H) 05/05/2016    EGFR 19.8 05/05/2016    MG 2.0 04/18/2016    AST 67 (H) 05/03/2016    ALB 3.3 (L) 05/03/2016    INR 1.3 (H) 04/17/2016    BNP 6,908 (H) 05/03/2016

## 2016-05-06 NOTE — Progress Notes (Signed)
RENAL PROGRESS NOTE    Date Time: 05/06/16 6:21 PM  Patient Name: Gregory [name]  Attending Physician: Evon Slack, MD    Assessment:   Acute kidney injury, creatinine keeps trending down.    Significant azotemia, has also been on steroids.  Also on diuretics.  Likely chronic kidney disease stage IV.  At present, unclear etiology.  Needs further record.  Protein to creatinine ratio is 0.7. Could be tubular involvement?  Systolic and diastolic congestive heart failure.  Blood pressure is on lower side.  Bilateral lower extremity edema.  Amyloidosis.  Plan:   Will give a dose of Lasix today.  Continue with the fluid restriction.  Low-sodium diet.  Follow renal panel in a.m.  Please try to obtain further information from [facility].  Medication in renal dose and avoid nephrotoxic.  Discussed in detail with the patient.  Subjective:     Patient is seen and examined at bedside.  Awake and alert.  Not in acute distress, sitting in the chair comfortably    Review of Systems:   No significant complaint.  Denies any nausea, vomiting, chest pain, palpitation, headache, visual changes, cough or dyspnea    Meds:     Current Facility-Administered Medications   Medication Dose Route Frequency   . aspirin EC  81 mg Oral Daily   . fluticasone  2 spray Each Nare Daily   . heparin (porcine)  5,000 Units Subcutaneous Q12H [facility]   . potassium chloride  40 mEq Oral Q12H [place]   . predniSONE  20 mg Oral Daily       Physical Exam:   BP 106/73   Pulse 91   Temp (!) 96 F (35.6 C)   Resp 18   Ht 1.651 m (_0 )   Wt 62.4 kg (137 lb 9.1 oz)   SpO2 93%   BMI 22.89 kg/m     Intake/Output Summary (Last 24 hours) at 05/06/16 1821  Last data filed at 05/06/16 0700   Gross per 24 hour   Intake              320 ml   Output                0 ml   Net              320 ml       General appearance - alert, oriented, well appearing, and in no distress  HEENT no pallor   Mucous membranes moist, skin turgor normal  Neck - JVP not  raised  Chest - Bilateral air entry  Heart -S1, S2,   Abdomen - soft, nontender, nondistended,  Has significant   leg edema,     Labs:     Recent Labs      05/06/16   0545  05/05/16   0335   WBC  7.55  6.58   Hgb  16.3  15.1   Hematocrit  46.9  43.4   Platelets  130*  140   MCV  94.0  94.6     Recent Labs      05/06/16   0940  05/05/16   0335   Sodium  138  135*   Potassium  2.8*  3.0*   Chloride  91*  90*   CO2  29  26   BUN  121.0*  123.0*   Creatinine  3.1*  3.7*   Glucose  123*  104*   Calcium  9.3  9.5  Recent Labs      05/03/16   1850   AST (SGOT)  67*   ALT  38   Alkaline Phosphatase  155*   Protein, Total  7.1   Albumin  3.3*     No results for input(s): PTT, PT, INR in the last 72 hours.                  Radiology Results (24 Hour)     ** No results found for the last 24 hours. Remus Blake, MD  05/06/2016  6:21 PM  [phone]

## 2016-05-06 NOTE — Progress Notes (Signed)
MCR Fx HF, 30D READM:    CM met w/ pt to update DCP. PT recommends home w/ supervision, no HHPT recommended. Pt aware and agreeable. [place] likely next 2-3 days per rounds.     Pt from home w/ spouse. Monitoring renal labs. DCP home w/ spouse, not homebound currently. Transport via spouse    CM to follow for DCP.    Royann Shivers, RN  Clinical Case Manager I  [phone]

## 2016-05-06 NOTE — Progress Notes (Signed)
[facility]  PROGRESS NOTE      Patient: Gregory [name]  Date: 05/06/2016   LOS: 3 Days  Admission Date: 05/03/2016   MRN: [id number]  Attending: Marya Landry  Please contact me on the following Pager.  [phone]      ASSESSMENT/PLAN     Gregory [name] is a 70 y.o. male admitted with AKI (acute kidney injury)    Interval Summary:     Patient Active Hospital Problem List:   AKI (acute kidney injury)On chronic kidney disease stage IV (05/04/2016)    Assessment: Patient's acute kidney injury, likely due to overuse of diuretics, hypotension, hypoperfusion.  Improving     Plan: Continue to hold Diuretics, manage blood pressure, follow-up with nephrology recommendations    Acute on chronic systolic/diastolic congestive heart failure (03/18/2014)  Nonischemic cardiomyopathy with ejection fraction of 20 percent to 25 percent     Assessment: Status post echocardiogram showing left ventricle ejection fraction of 20-25 percent with severe diffuse hypokinesis.  Patient with NYHA class IV.  ACC/AHA stage D, BNP [id number]     Plan: Continue to hold diuretics in order to avoid hypoperfusion of his kidneys.  Continue to follow closely follow up with cardiology recommendation .      Patient will likely need right heart catheterization and possible automatic implantable cardiac defibrillator placement.  As per cardiology      Elevated troponin (04/21/2014)    Assessment: Likely due to his renal failure and demand ischemia .  Peak troponin at 1.54     Plan: Continue monitor closely      Amyloidosis ()    Assessment: Patient with AL cardiac status post CyBorD therapy with relapse now on revlimid     Plan: Continue to monitor      Hyperkalemia (05/03/2016)   Hypokalemia     Assessment: A shunt with initial hyperkalemia Status post Kayexalate, insulin and dextrose Patient currently having worsening hypokalemia of 2.8    Plan: We will replete           Analgesia: Tylenol    Nutrition: Renal    DVT Prophylaxis: Heparin       Code  Status: Full    DISPO: To be determined             SUBJECTIVE     Gregory [name] states That he feels fine.  Denies any fever, chills, nausea, vomiting, diarrhea, chest pain, shortness of breath.  Does feel little bit weak    MEDICATIONS     Current Facility-Administered Medications   Medication Dose Route Frequency   . aspirin EC  81 mg Oral Daily   . bumetanide  1 mg Intravenous Once   . fluticasone  2 spray Each Nare Daily   . heparin (porcine)  5,000 Units Subcutaneous Q12H [facility]   . potassium chloride  40 mEq Oral Q12H [place]   . predniSONE  20 mg Oral Daily       PHYSICAL EXAM     Vitals:    05/06/16 1909   BP: 94/65   Pulse: 94   Resp: 17   Temp: (!) 95.6 F (35.3 C)   SpO2: 97%       Temperature: Temp  Min: 95.3 F (35.2 C)  Max: 96.6 F (35.9 C)  Pulse: Pulse  Min: 86  Max: 94  Respiratory: Resp  Min: 17  Max: 18  Non-Invasive BP: BP  Min: 87/64  Max: 106/73  Pulse Oximetry SpO2  Min: 93 %  Max: 99 %    Intake and Output Summary (Last 24 hours) at Date Time    Intake/Output Summary (Last 24 hours) at 05/06/16 1920  Last data filed at 05/06/16 1300   Gross per 24 hour   Intake              220 ml   Output              400 ml   Net             -180 ml         GEN APPEARANCE: Normal;  A&OX3  HEENT: PERLA; EOMI; Conjunctiva Clear  NECK: Supple; No bruits  CVS: RRR, S1, S2; No M/G/R  LUNGS: CTAB; No Wheezes; No Rhonchi: No rales  ABD: Soft; No TTP; + Normoactive BS  EXT: No edema; Pulses 2+ and intact  Skin exam:  no pallor  NEURO: CN 2-12 intact; No Focal neurological deficits  CAP REFILL:  Normal  MENTAL STATUS:  Normal        LABS       Recent Labs  Lab 05/06/16  0545 05/05/16  0335 05/04/16  0143   WBC 7.55 6.58 6.31   RBC 4.99 4.59* 4.79   Hgb 16.3 15.1 15.4   Hematocrit 46.9 43.4 45.3   MCV 94.0 94.6 94.6   Platelets 130* 140 123*         Recent Labs  Lab 05/06/16  0940 05/05/16  0335 05/04/16  1028 05/04/16  0143 05/03/16  2232   Sodium 138 135* 137 135* 134*   Potassium 2.8* 3.0* 3.6 4.0 4.9   Chloride  91* 90* 91* 92* 90*   CO2 _0 BUN 121.0* 123.0* 123.0* 126.0* 127.0*   Creatinine 3.1* 3.7* 4.2* 4.7* 4.7*   Glucose 123* 104* 103* 98 111*   Calcium 9.3 9.5 9.6 10.0 10.7*         Recent Labs  Lab 05/03/16  1850   ALT 38   AST (SGOT) 67*   Bilirubin, Total 3.5*   Bilirubin, Direct 1.9*   Albumin 3.3*   Alkaline Phosphatase 155*         Recent Labs  Lab 05/04/16  1028 05/04/16  0143 05/03/16  2232   Troponin I 1.50* 1.54* 1.28*             Microbiology Results     Procedure Component Value Units Date/Time    MRSA culture (If not [name] in Triage) [[id number]] Collected:  05/03/16 2314    Specimen:  Body Fluid from Nasal/Throat ASC Admission Updated:  05/04/16 2337    Narrative:       ORDER#: [id number]                                    ORDERED BY: WHITE, SUSAN  SOURCE: Nares and Throat                             COLLECTED:  05/03/16 23:14  ANTIBIOTICS AT COLL.:                                RECEIVED :  05/04/16 05:01  Culture MRSA Surveillance  FINAL       05/04/16 23:36  05/04/16   Negative for Methicillin Resistant Staph aureus from Nares and             Negative for Methicillin Resistant Staph aureus from Throat             RADIOLOGY     Chest 2 Views    Result Date: 05/03/2016   No acute pulmonary infiltrates are demonstrated. Steward Drone, MD 05/03/2016 8:40 PM     Hand Right Pa Lateral And Oblique    Result Date: 04/29/2016  No acute osseous process. Guy Sandifer, MD 04/29/2016 3:40 AM     Ct Head Without Contrast    Result Date: 04/17/2016   Stable examination of the brain without evidence of acute intracranial pathology. Please see discussion above for details. Prentice Docker, MD 04/17/2016 2:04 PM     Xr Chest  Ap Portable    Result Date: 04/17/2016   No acute findings. [name], MD 04/17/2016 1:47 PM     US Venous Up Extrem Duplex Dopp Uni Right    Result Date: 05/04/2016  1. Negative for DVT or central venous obstruction of the right upper extremity. 2. Focal chronic  appearing superficial thrombophlebitis of the right cephalic vein near the antecubital fossa. The remaining right cephalic vein is patent. Maureen Ralphs, MD 05/04/2016 5:09 PM       Signed,  Marya Landry  7:20 PM 05/06/2016

## 2016-05-06 NOTE — Plan of Care (Signed)
Problem: Compromised Tissue integrity  Goal: Damaged tissue is healing and protected  Outcome: Progressing   05/06/16 1401   Goal/Interventions addressed this shift   Damaged tissue is healing and protected  Monitor/assess Braden scale every shift;Provide wound care per wound care algorithm;Reposition patient every 2 hours and as needed unless able to reposition self;Increase activity as tolerated/progressive mobility;Relieve pressure to bony prominences for patients at moderate and high risk;Avoid shearing injuries;Keep intact skin clean and dry;Use bath wipes, not soap and water, for daily bathing;Use incontinence wipes for cleaning urine, stool and caustic drainage. Foley care as needed;Monitor external devices/tubes for correct placement to prevent pressure, friction and shearing;Encourage use of lotion/moisturizer on skin;Monitor patient's hygiene practices;Consult/collaborate with wound care nurse   Patient able to void using urinal. Skin is intact, no signs of breakdown at this time. Patient is out of bed in chair where he states he feels most comfortable. Foot of chair elevated to help minimize swelling in feet and legs. Will continue to monitor.

## 2016-05-06 NOTE — Progress Notes (Signed)
Transitional Care Management    Writer provided an introduction and information about the TCM program and program involvement in the patient's care post discharge for education and management; patient verbalized confirmation and understanding. The patient identified his spouse as the best contact person post discharge: [phone]. Writer provided the patient with a TCM program brochure and writers contact information for f/u post discharge.  Writer will continue to monitor the patient's discharge plan for appropriateness of TCM program involvement post discharge.    Ileana Roup, RN, BSN   Case Public librarian Transitional Care Management   [phone]

## 2016-05-06 NOTE — Progress Notes (Addendum)
rec'vd transfer from CVICU, placed on tele, SR 70s, pt alert oriented x4, oriented to unit and workflows, educated on fall precautions, use of call light, pain management, verbalized understanding, pt stated that he doesn't need help to ambulate. Advised of need to assure that he is steady on his feet and gait steady and to please call for assistance when need to go to bathroom. Verbalized understanding.  CHF education folder given and contents discussed

## 2016-05-07 ENCOUNTER — Encounter
Admission: EM | Disposition: A | Discharge status: Home Health | Payer: Self-pay | Source: Home / Self Care | Attending: Internal Medicine

## 2016-05-07 LAB — BASIC METABOLIC PANEL
Anion Gap: 16 — ABNORMAL HIGH (ref 5.0–15.0)
BUN: 117 mg/dL — ABNORMAL HIGH (ref 9.0–28.0)
CO2: 31 mEq/L — ABNORMAL HIGH (ref 22–29)
Calcium: 9.5 mg/dL (ref 8.5–10.5)
Chloride: 92 mEq/L — ABNORMAL LOW (ref 100–111)
Creatinine: 2.9 mg/dL — ABNORMAL HIGH (ref 0.7–1.3)
Glucose: 109 mg/dL — ABNORMAL HIGH (ref 70–100)
Potassium: 3.2 mEq/L — ABNORMAL LOW (ref 3.5–5.1)
Sodium: 139 mEq/L (ref 136–145)

## 2016-05-07 LAB — CBC
Absolute NRBC: 0 10*3/uL
Hematocrit: 45.4 % (ref 42.0–52.0)
Hgb: 15.3 g/dL (ref 13.0–17.0)
MCH: 31.7 pg (ref 28.0–32.0)
MCHC: 33.7 g/dL (ref 32.0–36.0)
MCV: 94.2 fL (ref 80.0–100.0)
MPV: 12.1 fL (ref 9.4–12.3)
Nucleated RBC: 0 /100 WBC (ref 0.0–1.0)
Platelets: 139 10*3/uL — ABNORMAL LOW (ref 140–400)
RBC: 4.82 10*6/uL (ref 4.70–6.00)
RDW: 14 % (ref 12–15)
WBC: 7.54 10*3/uL (ref 3.50–10.80)

## 2016-05-07 LAB — PHOSPHORUS: Phosphorus: 4 mg/dL (ref 2.3–4.7)

## 2016-05-07 LAB — GFR: EGFR: 26.3

## 2016-05-07 LAB — HEMOLYSIS INDEX: Hemolysis Index: 8 (ref 0–18)

## 2016-05-07 LAB — MAGNESIUM: Magnesium: 1.8 mg/dL (ref 1.6–2.6)

## 2016-05-07 SURGERY — RIGHT HEART CATH
Laterality: Right

## 2016-05-07 MED ORDER — LIDOCAINE HCL (PF) 1 % IJ SOLN
INTRAMUSCULAR | Status: AC
Start: 2016-05-07 — End: 2016-05-07
  Administered 2016-05-07: 5 mL via SUBCUTANEOUS
  Filled 2016-05-07: qty 30

## 2016-05-07 MED ORDER — MIDAZOLAM HCL 2 MG/2ML IJ SOLN
INTRAMUSCULAR | Status: AC
Start: 2016-05-07 — End: 2016-05-07
  Administered 2016-05-07: 1 mg via INTRAVENOUS
  Filled 2016-05-07: qty 2

## 2016-05-07 MED ORDER — FENTANYL CITRATE (PF) 50 MCG/ML IJ SOLN (WRAP)
INTRAMUSCULAR | Status: AC
Start: 2016-05-07 — End: 2016-05-07
  Administered 2016-05-07: 25 ug via INTRAVENOUS
  Filled 2016-05-07: qty 2

## 2016-05-07 MED ORDER — HEPARIN SODIUM (PORCINE) 1000 UNIT/ML IJ SOLN
INTRAMUSCULAR | Status: AC
Start: 2016-05-07 — End: 2016-05-07
  Filled 2016-05-07: qty 10

## 2016-05-07 NOTE — Progress Notes (Addendum)
S[facility] H[facility]ITAL[facility]  PROGRESS NOTE      Patient: Gregory [name]  Date: 05/07/2016   LOS: 4 Days  Admission Date: 05/03/2016   MRN: [id number]  Attending: Salena Saner  Gregory [name]  Please contact me on the following Pager.  [phone]      ASSESSMENT/PLAN     Gregory [name] is Gregory 70 y.o. 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  Patient with NYHA class IV.  ACC/AHA stage D, BNP 6908   -Received 1 mg of IV Bumex last night, none since  Fluid restriction 1.5 liter/day, low sodium  Bisoprolol and not started due to hypotension  Does not tolerate ACE inhibitor due to hypotension  Avoid Aldactone due to renal failure and hyperkalemia, present on admission  Has very poor prognosis.  I will get palleative care involved today    Patient will likely need right heart catheterization and possible automatic implantable cardiac defibrillator placement  per cardiology      Elevated troponin (04/21/2014)    Assessment: Likely due to his renal failure and demand ischemia .  Peak troponin at 1.54     Plan: Continue monitor closely      Amyloidosis ()    Assessment: Patient with AL cardiac status post CyBorD therapy with relapse now on revlimid     holding dexamethasone while patient is on prednisone      Hyperkalemia (05/03/2016), present on admission, resolved   Hypokalemia     replace and monitor closely     Gout attack left hand- on prednisone  Abnormal liver function tests-suspect related to passive congestion of the liver due to right-sided heart failure  Will follow liver function tests      Analgesia: Tylenol    Nutrition: Renal    DVT Prophylaxis: Heparin       Code Status: Full    DISPO: To be determined             SUBJECTIVE     He is frustrated due to nothing by mouth status.  Reports hand pain, discomfort in his mouth, cramping in both legs.  he did not sleep well last night.  Has appropriate questions regarding the cardiac catheterization, which apparently is scheduled for today.  I explained thoroughly to him the risk of end-stage renal disease and need of dialysis after contrast.  He remains pretty helpful regarding his overall prognosis.  He indicates he has Gregory friend that underwent similar procedure and she has been very encouraging to him     anMEDICATIONS     Current Facility-Administered Medications   Medication Dose Route Frequency   . aspirin EC  81 mg Oral Daily   . fluticasone  2 spray Each Nare Daily   . heparin (porcine)  5,000 Units Subcutaneous Q12H Gregory [name]   . potassium chloride  40  mEq Oral Q12H [place]   . predniSONE  20 mg Oral Daily       PHYSICAL EXAM     Vitals:    05/07/16 0751   BP: 104/67   Pulse: 89   Resp: 17   Temp: 97 F (36.1 C)   SpO2: 98%       Temperature: Temp  Min: 95.3 F (35.2 C)  Max: 97 F (36.1 C)  Pulse: Pulse  Min: 86  Max: 94  Respiratory: Resp  Min: 17  Max: 18  Non-Invasive BP: BP  Min: 87/64  Max: 106/73  Pulse Oximetry SpO2  Min: 93 %  Max: 98 %    Intake and Output Summary (Last 24 hours) at Date Time    Intake/Output Summary (Last 24 hours) at 05/07/16 0911  Last data filed at 05/07/16 0751   Gross per 24 hour   Intake              260 ml   Output             1400 ml   Net            -1140 ml         GEN APPEARANCE: Normal;  Gregory&OX3  HEENT: PERLA; EOMI;  Conjunctiva Clear  NECK: Supple; No bruits  CVS: RRR, S1, S2; No M/G/R  LUNGS: CTAB; No Wheezes; No Rhonchi: No rales  ABD: Soft; No TTP; + Normoactive BS  EXT: 3+ edema edema; Pulses 2+ and intact, both hands are swollen, left more than right, and warm   Skin exam:  no pallor  NEURO: CN 2-12 intact; No Focal neurological deficits  CAP REFILL:  Normal  MENTAL STATUS:  Normal        LABS       Recent Labs  Lab 05/07/16  0418 05/06/16  0545 05/05/16  0335   WBC 7.54 7.55 6.58   RBC 4.82 4.99 4.59*   Hgb 15.3 16.3 15.1   Hematocrit 45.4 46.9 43.4   MCV 94.2 94.0 94.6   Platelets 139* 130* 140         Recent Labs  Lab 05/07/16  0419 05/06/16  0940 05/05/16  0335 05/04/16  1028 05/04/16  0143   Sodium 139 138 135* 137 135*   Potassium 3.2* 2.8* 3.0* 3.6 4.0   Chloride 92* 91* 90* 91* 92*   CO2 31* _0 BUN 117.0* 121.0* 123.0* 123.0* 126.0*   Creatinine 2.9* 3.1* 3.7* 4.2* 4.7*   Glucose 109* 123* 104* 103* 98   Calcium 9.5 9.3 9.5 9.6 10.0   Magnesium 1.8  --   --   --   --          Recent Labs  Lab 05/03/16  1850   ALT 38   AST (SGOT) 67*   Bilirubin, Total 3.5*   Bilirubin, Direct 1.9*   Albumin 3.3*   Alkaline Phosphatase 155*         Recent Labs  Lab 05/04/16  1028 05/04/16  0143 05/03/16  2232   Troponin I 1.50* 1.54* 1.28*             Microbiology Results     Procedure Component Value Units Date/Time    MRSA culture (If not done in Triage) [[id number]] Collected:  05/03/16 2314    Specimen:  Body Fluid from Nasal/Throat ASC Admission Updated:  05/04/16 2337    Narrative:  ORDER#: [id number]                                    ORDERED BY: WHITE, SUSAN  SOURCE: Nares and Throat                             COLLECTED:  05/03/16 23:14  ANTIBIOTICS AT COLL.:                                RECEIVED :  05/04/16 05:01  Culture MRSA Surveillance                  FINAL       05/04/16 23:36  05/04/16   Negative for Methicillin Resistant Staph aureus from Nares and             Negative for Methicillin Resistant  Staph aureus from Throat             RADIOLOGY     Chest 2 Views    Result Date: 05/03/2016   No acute pulmonary infiltrates are demonstrated. Steward Drone, MD 05/03/2016 8:40 PM     Hand Right Pa Lateral And Oblique    Result Date: 04/29/2016  No acute osseous process. Guy Sandifer, MD 04/29/2016 3:40 AM     Ct Head Without Contrast    Result Date: 04/17/2016   Stable examination of the brain without evidence of acute intracranial pathology. Please see discussion above for details. Prentice Docker, MD 04/17/2016 2:04 PM     Xr Chest  Ap Portable    Result Date: 04/17/2016   No acute findings. [name], MD 04/17/2016 1:47 PM     US Venous Up Extrem Duplex Dopp Uni Right    Result Date: 05/04/2016  1. Negative for DVT or central venous obstruction of the right upper extremity. 2. Focal chronic appearing superficial thrombophlebitis of the right cephalic vein near the antecubital fossa. The remaining right cephalic vein is patent. Maureen Ralphs, MD 05/04/2016 5:09 PM       Signed,  Dorothy Spark Tylik Treese  9:11 AM 05/07/2016

## 2016-05-07 NOTE — Plan of Care (Signed)
Problem: Pre-op Phase - Cardiac Surgery  Goal: Patient will be ready for surgery  Outcome: Progressing      Problem: Post-op Phase - Cardiac Surgery  Goal: Effective breathing pattern is maintained  Outcome: Progressing   05/07/16 1311   Goal/Interventions addressed this shift   Effective breathing pattern is maintained  Position patient for maximum ventilatory efficiency with HOB to a minimum of 30 degrees when hemodynamically stable;Maintain SpO2 level per LIP order;Reinforce use of ordered respiratory interventions (i.e. CPAP, BiPAP, Incentive Spirometer, Acapella);Monitor for sleep apnea;Monitor for medication-induced respiratory depression   Patient is currently receiving oxygen 2 Liters for comfort while sleeping.   Goal: Cardiac output is adequate  Outcome: Progressing   05/07/16 1311   Goal/Interventions addressed this shift   Cardiac output is adequate  Monitor/assess vital signs, hemodynamic parameters, and temperature per LIP orders;Monitor/assess neurovascular status (i.e. pulses, capillary refill, pain, paresthesia, presence of edema);Maintain temperature within ordered parameters     Goal: Patient will remain free from post-op complications  Outcome: Progressing   05/07/16 1311   Goal/Interventions addressed this shift   Patient will remain free from post-op complications  VTE prevention: Administer anticoagulants and/or apply anti-embolism stockings/devices as ordered;Assess dressing and reinforce or change per LIP order;Assess surgical incision/wound site and treat per LIP order     Goal: Mobility/activity is maintained at optimal level  Outcome: Progressing   05/07/16 1311   Goal/Interventions addressed this shift   Mobility/activity is maintained at optimal level  Increase mobility as tolerated/progressive mobility;Encourage independent activity per ability. Reinforce sternal precautions.;Plan activities to conserve energy. Plan rest periods.   After cardic cath, patient will maintain effective  breathing pattern, maintain normal HR and rhythm, remain free from post of infection and have adequate nutritional intake.

## 2016-05-07 NOTE — Progress Notes (Signed)
Chaplain Service      Assessment:  Spiritual Assessment: Unable to assess (comment) (asleep)    Background:  Visit Type: Initial (PC) was made by Chaplain with patient, Gregory [name], based on Source: Chaplain Initiated, Palliative Care Consult.  Present at Visit: patient.  Spiritual Care Provided to: patient only.  Length of Visit: 0-15 minutes .    Summary:  Reason for Request: Spiritual Support, Spiritual Assessment   Spiritual Care Interventions: Provided silent and supportive presence   Spiritual Care Outcomes: Consulted with social worker, Patient appeared to have appreciated visit      Notes:    Patient was asleep during visit. Pastoral presence and contact information provided.

## 2016-05-07 NOTE — Progress Notes (Signed)
Pt tired/drowsy after cardiac cath.  Will see tomorrow for full consultation             Signed by: Stark Jock, NP  [facility] [place]  [phone]

## 2016-05-07 NOTE — Progress Notes (Addendum)
Received patient from [place] on stretcher. Patient asleep and easily aroused. Dressing noted to Rt femoral, clean dry and intact. Patient laying flat at this time. Will continue to monitor. Good pedal pulses with doppler secondary to pitting edema +2. VSS. Allowing patient to rest at this time. Will continue to monitor.

## 2016-05-07 NOTE — UM Notes (Signed)
Cont'd 05/07/16    He is frustrated due to nothing by mouth status.  Reports hand pain, discomfort in his mouth, cramping in both legs.  he did not sleep well last night.  Has appropriate questions regarding the cardiac catheterization, which apparently is scheduled for today.  I explained thoroughly to him the risk of end-stage renal disease and need of dialysis after contrast.  He remains pretty helpful regarding his overall prognosis.  He indicates he has a friend that underwent similar procedure and she has been very encouraging to him    BP 99/70   Pulse 86   Temp 98.2 F (36.8 C) (Oral)   Resp 16   Ht 1.651 m (_0 )   Wt 61.7 kg (136 lb)   SpO2 98%   BMI 22.63 kg/m     Temp:  [95.3 F (35.2 C)-98.2 F (36.8 C)]   Heart Rate:  [86-94]   Resp Rate:  [16-18]   BP: (87-106)/(59-73)   SpO2:  [93 %-98 %]   Weight:  [61.7 kg (136 lb)]     Last recorded pain score:  Pain Scale Used: Numeric Scale (0-10)  Pain Score: 6-moderate pain   Results     Procedure Component Value Units Date/Time    GFR [[id number]] Collected:  05/07/16 0419     EGFR 42.4    Basic Metabolic Panel [[id number]]  (Abnormal) Collected:  05/07/16 0419     Glucose 109 (H) mg/dL      BUN 117.0 (H) mg/dL      Creatinine 2.9 (H) mg/dL      Potassium 3.2 (L) mEq/L      Chloride 92 (L) mEq/L      CO2 31 (H) mEq/L      Anion Gap 16.0 (H)    CBC without differential [[id number]]  (Abnormal) Collected:  05/07/16 0418     Platelets 139 (L) x10 3/uL     Uric acid [[id number]]  (Abnormal) Collected:  05/06/16 1321    Specimen:  Blood Updated:  05/06/16 1407     Uric acid 16.4 (H) mg/dL     Hemolysis index [[id number]]  (Abnormal) Collected:  05/06/16 1321     Updated:  05/06/16 1407     Hemolysis Index 21 (H)        Scheduled Meds:  Current Facility-Administered Medications   Medication Dose Route Frequency   . fentaNYL (PF)       . midazolam       . aspirin EC  81 mg Oral Daily   . fluticasone  2 spray Each Nare Daily   . heparin (porcine)       .  heparin (porcine)  5,000 Units Subcutaneous Q12H [facility]   . lidocaine       . potassium chloride  40 mEq Oral Q12H [place]   . predniSONE  20 mg Oral Daily     PRN Meds:. oxyCODONE-acetaminophen po x 1    Orders: NPO, Tele    ASSESSMENT/PLAN  Gregory [name] is a 70 y.o. 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  Patient with NYHA class IV.  ACC/AHA stage D, BNP 6908   -Received 1 mg of IV Bumex last night, none since  Fluid restriction 1.5 liter/day, low sodium  Bisoprolol and not started due to hypotension  Does not tolerate ACE inhibitor due to hypotension  Avoid Aldactone due to renal failure and hyperkalemia, present on admission  Has very poor prognosis.  I will get palleative care involved today    Patient will likely need right heart catheterization and possible automatic implantable cardiac defibrillator placement  per cardiology      Elevated troponin (04/21/2014)    Assessment: Likely due to his renal failure and demand ischemia .  Peak troponin at 1.54     Plan: Continue monitor closely      Amyloidosis ()    Assessment: Patient with AL cardiac status post CyBorD therapy with relapse now on revlimid     holding dexamethasone while patient is on prednisone     Hyperkalemia  (05/03/2016), present on admission, resolved   Hypokalemia     replace and monitor closely    Gout attack left hand- on prednisone  Abnormal liver function tests-suspect related to passive congestion of the liver due to right-sided heart failure  Will follow liver function tests    Analgesia: Tylenol    Nutrition: Renal    DVT Prophylaxis: Heparin        Gwenith Daily, BSN, RN  Utilization Review Nurse  Case Management Dept  [facility]  [phone] [facility] Staff Only)  [phone] (Carriers)    Please submit all clinical review request via fax to [phone]

## 2016-05-07 NOTE — Plan of Care (Signed)
Problem: Hemodynamic Status: Cardiac  Goal: Stable vital signs and fluid balance  Outcome: Progressing   05/07/16 0257   Goal/Interventions addressed this shift   Stable vital signs and fluid balance Monitor/assess vital signs and telemetry per unit protocol;Weigh on admission and record weight daily;Monitor intake/output per unit protocol and/or LIP order;Monitor lab values;Monitor for leg swelling/edema and report to LIP if abnormal;Assess signs and symptoms associated with cardiac rhythm changes       Problem: Heart Failure  Goal: Stable vital signs and fluid balance  Outcome: Progressing   05/07/16 0257   Goal/Interventions addressed this shift   Stable vital signs and fluid balance Monitor/assess vital signs and telemetry per unit protocol;Weigh on admission and record weight daily;Monitor intake/output per unit protocol and/or LIP order;Monitor lab values;Monitor for leg swelling/edema and report to LIP if abnormal;Assess signs and symptoms associated with cardiac rhythm changes       Comments: Alert/oriented x4, SR on the monitor, denies any pain, no respiratory distress noted, on 1573m fluid restriction, bumex 142mIV given x1 with BP 101/73,  nothing by mouth after 1283mfor cardiac cath in am, pt was instructed, verbalized understanding.

## 2016-05-07 NOTE — Progress Notes (Signed)
RENAL PROGRESS NOTE    Date Time: 05/07/16 10:48 AM  Patient Name: Gregory [name]  Attending Physician: Cherylann Banas, MD    Assessment:   Acute kidney injury, renal function continues to improve; high BUN to creatinine ratio , likely contributed by steroids and diuresis.   Stage 3-4 chronic kidney disease with some proteinuria, creatinine has been ranging 2.8-3.0 earlier this year at [facility].    Systolic and diastolic congestive heart failure, diuresed.  Hypotension, likely related to cardiomyopathy from amyloid heart disease   Bilateral lower extremity edema, improving.  AmyloidosisWith heart disease and (??) kidney involvement.    Plan:   Continue to hold diuretics.  Continue low-sodium diet with 1200-1500 mL/day of fluids.  No changes to medication  Cardiology to follow  I will follow renal function and electrolytes daily.    Subjective:   Chart reviewed and noted.  Normal right heart pressures, wedge pressure normal, cardiac index is low.  Urine output is fair  According to nurses, patient has not had any diarrhea.  Swelling has much improved    Review of Systems:   Unable to do because of patient's current condition    Meds:     . fentaNYL (PF)       . midazolam       . aspirin EC  81 mg Oral Daily   . fluticasone  2 spray Each Nare Daily   . heparin (porcine)       . heparin (porcine)  5,000 Units Subcutaneous Q12H [facility]   . lidocaine       . potassium chloride  40 mEq Oral Q12H [place]   . predniSONE  20 mg Oral Daily     Physical Exam:     BP 99/70   Pulse 86   Temp 98.2 F (36.8 C) (Oral)   Resp 16   Ht 1.651 m (_0 )   Wt 61.7 kg (136 lb)   SpO2 98%   BMI 22.63 kg/m     Intake              260 ml   Output             1100 ml   Net             -840 ml     Sedated status post cardiac catheterization now.  Unable to respond   Comfortable, nondyspneic  JVP not visible  Lungs reveal good air entry bilaterally, unable to hear crackles.  Heart tones are normal, early systolic murmur  Abdomen is  nontender, bowel sounds present  Improving edema    Labs:        05/07/16   0418  05/06/16   0545   WBC  7.54  7.55   Hgb  15.3  16.3   Hematocrit  45.4  46.9   Platelets  139*  130*     Sodium  139  138   Potassium  3.2*  2.8*   Chloride  92*  91*   CO2  31*  29   BUN  117.0*  121.0*   Creatinine  2.9*  3.1*   Glucose  109*  123*   Calcium  9.5  9.3   Magnesium  1.8   --    Phosphorus  4.0   --        Signed by: Jerilynn Som, [place]

## 2016-05-07 NOTE — Progress Notes (Signed)
Patient to Admission [place] for Right Cardiac Catheterization  See pre procedure screening tool in EPIC for further data

## 2016-05-07 NOTE — Procedures (Signed)
Findings; RA =8                  RV=24/8                  PA=29/9 mean = 16                  PW=13  CO by TD=1.9 L/M   CO by Fick=1.9 L/M CI= 1.2    Impression : Normal right heart pressures                       Low cardiac output    Plan Will discuss with HF team.

## 2016-05-08 ENCOUNTER — Inpatient Hospital Stay: Payer: Medicare Other

## 2016-05-08 LAB — COMPREHENSIVE METABOLIC PANEL
ALT: 46 U/L (ref 0–55)
AST (SGOT): 74 U/L — ABNORMAL HIGH (ref 5–34)
Albumin/Globulin Ratio: 0.9 (ref 0.9–2.2)
Albumin: 2.9 g/dL — ABNORMAL LOW (ref 3.5–5.0)
Alkaline Phosphatase: 122 U/L — ABNORMAL HIGH (ref 38–106)
Anion Gap: 14 (ref 5.0–15.0)
BUN: 108 mg/dL — ABNORMAL HIGH (ref 9.0–28.0)
Bilirubin, Total: 2.8 mg/dL — ABNORMAL HIGH (ref 0.2–1.2)
CO2: 28 mEq/L (ref 22–29)
Calcium: 8.8 mg/dL (ref 8.5–10.5)
Chloride: 95 mEq/L — ABNORMAL LOW (ref 100–111)
Creatinine: 3 mg/dL — ABNORMAL HIGH (ref 0.7–1.3)
Globulin: 3.3 g/dL (ref 2.0–3.6)
Glucose: 98 mg/dL (ref 70–100)
Potassium: 4.2 mEq/L (ref 3.5–5.1)
Protein, Total: 6.2 g/dL (ref 6.0–8.3)
Sodium: 137 mEq/L (ref 136–145)

## 2016-05-08 LAB — CBC
Absolute NRBC: 0 10*3/uL
Hematocrit: 44.9 % (ref 42.0–52.0)
Hgb: 15.1 g/dL (ref 13.0–17.0)
MCH: 32.3 pg — ABNORMAL HIGH (ref 28.0–32.0)
MCHC: 33.6 g/dL (ref 32.0–36.0)
MCV: 95.9 fL (ref 80.0–100.0)
MPV: 12.2 fL (ref 9.4–12.3)
Nucleated RBC: 0 /100 WBC (ref 0.0–1.0)
Platelets: 131 10*3/uL — ABNORMAL LOW (ref 140–400)
RBC: 4.68 10*6/uL — ABNORMAL LOW (ref 4.70–6.00)
RDW: 14 % (ref 12–15)
WBC: 8.08 10*3/uL (ref 3.50–10.80)

## 2016-05-08 LAB — HEMOLYSIS INDEX: Hemolysis Index: 5 (ref 0–18)

## 2016-05-08 LAB — C-REACTIVE PROTEIN: C-Reactive Protein: 0.6 mg/dL (ref 0.0–0.8)

## 2016-05-08 LAB — BILIRUBIN, DIRECT: Bilirubin Direct: 1.4 mg/dL — ABNORMAL HIGH (ref 0.0–0.5)

## 2016-05-08 LAB — MAGNESIUM: Magnesium: 1.8 mg/dL (ref 1.6–2.6)

## 2016-05-08 LAB — GFR: EGFR: 25.3

## 2016-05-08 MED ORDER — MILRINONE LACTATE IN DEXTROSE 20-5 MG/100ML-% IV SOLN
0.1250 ug/kg/min | INTRAVENOUS | Status: DC
Start: 2016-05-08 — End: 2016-05-09
  Administered 2016-05-08: 0.125 ug/kg/min via INTRAVENOUS
  Filled 2016-05-08: qty 100

## 2016-05-08 MED ORDER — COLCHICINE 0.6 MG PO TABS
0.6000 mg | ORAL_TABLET | Freq: Every day | ORAL | Status: DC
Start: 2016-05-08 — End: 2016-05-08

## 2016-05-08 MED ORDER — COLCHICINE 0.6 MG PO TABS
0.3000 mg | ORAL_TABLET | Freq: Every day | ORAL | Status: DC
Start: 2016-05-08 — End: 2016-05-13
  Administered 2016-05-08 – 2016-05-13 (×6): 0.3 mg via ORAL
  Filled 2016-05-08 (×7): qty 1

## 2016-05-08 NOTE — Consults (Signed)
CM met with pt re: MD request for closer clinic for amyloidosis tx. Pt stated he goes to [facility] and isn't sure yet if he wants to switch clinics. Pt is dealing w/ other medical concerns at this time and would like to readdress later.     Royann Shivers, RN  Clinical Case Manager I  [phone]

## 2016-05-08 NOTE — Plan of Care (Signed)
Problem: Renal Instability  Goal: Fluid and electrolyte balance are achieved/maintained  Outcome: Progressing   05/08/16 1000   Goal/Interventions addressed this shift   Fluid and electrolyte balance are achieved/maintained  Monitor intake and output every shift;Monitor/assess lab values and report abnormal values;Provide adequate hydration;Assess for confusion/personality changes;Assess and reassess fluid and electrolyte status;Observe for seizure activity and initiate seizure precautions if indicated;Observe for cardiac arrhythmias;Follow fluid restrictions/IV/PO parameters      05/08/16 1000   Goal/Interventions addressed this shift   Fluid and electrolyte balance are achieved/maintained  Monitor intake and output every shift;Monitor/assess lab values and report abnormal values;Provide adequate hydration;Assess for confusion/personality changes;Assess and reassess fluid and electrolyte status;Observe for seizure activity and initiate seizure precautions if indicated;Observe for cardiac arrhythmias;Follow fluid restrictions/IV/PO parameters       Comments: Nephrologist came and evaluated patient's renal function,renal lytes been elevated but as per NEPHPROLOGIST not yet candidate for HD at this time,will starts [place]  Will see if renal function will improves.DR MAY CARDIOLOGIST  ordered this drip and patient will be will be transfer to [facility] monitor closely this drip.Patient and family were present when DOCTORS made their rounds ,questions were answered and verbalizes good understanding .Educated about the medication,able to [employer].

## 2016-05-08 NOTE — Consults (Signed)
Peripherally Inserted Central Catheter (PICC) PROCEDURE NOTE    Shew,Caellum T  05/08/2016    INDICATIONS: Home care intravenous therapy    CONSENT: The procedure, risks, benefits and alternatives were discussed with the patient.  All questions were answered, and informed written consent was obtained from the patient.  Informed consent and Procedure Board[place] were completed.     PROCEDURE DETAILS:      Ultrasound was used to confirm patency of the Right Basilic vein prior to obtaining venous access. The area to be punctured was  cleansed with chlorhexidine 2% for more than 30 seconds and the site draped using maximal sterile barrier precautions.    After 1% lidocaine injected followed by puncture of the Right Basilic with a [id number] single-wall needle under direct sonographic guidance. Guidewire was advanced and the needle was removed and a peel-away sheath was placed. The catheter was then trimmed and advanced through the peel-away sheath using electronic navigation.  The sheath was removed, brisk blood return confirmed, the catheter was flushed with normal saline and capped.      Maximal sterile barrier was used: cap, mask, sterile gloves, sterile gown and body drape.  All guide wires were removed with tip intact by visual inspection.  The catheter was stabilized on the skin using a securement device.  Antimicrobial disc and sterile transparent occlusive dressing applied using aseptic technique.     Patient did tolerate procedure well.        Catheter Type Arrow POwer PICC with CHloragard  Insertion Site (vein): Right Basilic  Total Length: 39 cm  Internal Length: 35 cm  External Length: 4 cm  UAC: 27 cm    PICC Reference #: [id number]  PICC Kit Lot #: [id number]  PICC Kit expiration date: [id number]   FINDINGS/CONCLUSIONS:   No signs of bleeding or symptoms of nerve irritation noted.  Final tip location was confirmed utilizing ECG tip confirmation.  PICC is ready for use  PICC education / instructions  provided to patient.  JNinfa Meeker RN

## 2016-05-08 NOTE — Plan of Care (Signed)
Problem: Safety  Goal: Patient will be free from injury during hospitalization  Outcome: Progressing   05/08/16 0606   Goal/Interventions addressed this shift   Patient will be free from injury during hospitalization  Assess patient's risk for falls and implement fall prevention plan of care per policy;Provide and maintain safe environment;Use appropriate transfer methods;Ensure appropriate safety devices are available at the bedside;Include patient/ family/ care giver in decisions related to safety;Hourly rounding;Assess for patients risk for elopement and implement [employer] per policy;Provide alternative method of communication if needed (communication boards, writing)   Pt A&O x4, resting in recliner. Chair alarm on in place. Pt educated on fall prevention and verbalize understanding of teaching. Call light within reach and pt instructed to call with needs. Rounding in place for safety. Safe environment maintained. Will continue to monitor.    Problem: Hemodynamic Status: Cardiac  Goal: Stable vital signs and fluid balance  Outcome: Progressing   05/08/16 0606   Goal/Interventions addressed this shift   Stable vital signs and fluid balance Monitor/assess vital signs and telemetry per unit protocol;Weigh on admission and record weight daily;Assess signs and symptoms associated with cardiac rhythm changes;Monitor intake/output per unit protocol and/or LIP order;Monitor lab values;Monitor for leg swelling/edema and report to LIP if abnormal   Pt post cardiac cath, dressing to R groin gauze and tape is C/D/I. No bleeding, no hematoma present. Positive distal pulses. LE edema +3, doppler was used. Pt is currently NSR on monitor. Pt denies chest pain, SOB. Pt voided in urinal. Will continue to monitor.    Problem: Heart Failure  Goal: Stable vital signs and fluid balance  Outcome: Progressing   05/08/16 0606   Goal/Interventions addressed this shift   Stable vital signs and fluid balance Monitor/assess vital  signs and telemetry per unit protocol;Weigh on admission and record weight daily;Assess signs and symptoms associated with cardiac rhythm changes;Monitor intake/output per unit protocol and/or LIP order;Monitor lab values;Monitor for leg swelling/edema and report to LIP if abnormal   Pt post cardiac cath, dressing to R groin gauze and tape is C/D/I. No bleeding, no hematoma present. Positive distal pulses. LE edema +3, doppler was used. Pt is currently NSR on monitor. Pt denies chest pain, SOB. Pt voided in urinal. Will continue to monitor.

## 2016-05-08 NOTE — Progress Notes (Signed)
Pt transferred from [facility] with personal belongings. Fall risk and belongings forms complete on unit 25 at patient's bedside.

## 2016-05-08 NOTE — Progress Notes (Signed)
[facility]  PROGRESS NOTE      Patient: Gregory [name]  Date: 05/08/2016   LOS: 5 Days  Admission Date: 05/03/2016   MRN: [id number]  Attending: Salena Saner  A Ketzaly Cardella  Please contact me on the following Pager.  [phone]      ASSESSMENT/PLAN     Gregory [name] is a 70 y.o. 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  Patient with NYHA class IV.  ACC/AHA stage D, BNP 6908   Hold diuretics  Fluid restriction 1.5 liter/day, low sodium  Bisoprolol and not started due to hypotension  Does not tolerate ACE inhibitor due to hypotension  Avoid Aldactone due to renal failure and hyperkalemia, present on admission  Start milrinone drip low dose to watch for hypotension and response, increase as tolerated  PICC line  No ICD- spoke with Dr May, unlikely to help and might be problematic due to amyloid( no capture)     Elevated troponin (04/21/2014)    Assessment: Likely due to his renal failure and demand ischemia .  Peak troponin at 1.54     Plan: Continue monitor closely       Cardiac Amyloidosis    Assessment: Patient with AL cardiac status post CyBorD therapy with relapse now on revlimid     holding dexamethasone while patient is on prednisone  Thrombocytopenia  monitor     Hypokalemia     replace and monitor closely     Gout attack both hands on prednisone- start colchicine as he still has significant swelling and decreased ROM both  hands  Abnormal liver function tests-suspect related to passive congestion of the liver due to right-sided heart failure  Will follow liver function tests      Analgesia: Tylenol    Nutrition: Renal    DVT Prophylaxis: Heparin       Code Status: Full    DISPO: To be determined             SUBJECTIVE     Had some tough conversation , understands his poor prognosis but will to try anything that's possible. Hands still swollen, has difficulties with ROM, leg swelling improved. Tolerated cardiac cath well. Plan of care discussed with Dr May.     anMEDICATIONS     Current Facility-Administered Medications   Medication Dose Route Frequency   . aspirin EC  81 mg Oral Daily   . colchicine  0.6 mg Oral Daily   . fluticasone  2 spray Each Nare Daily   . heparin (porcine)  5,000 Units Subcutaneous Q12H [facility]   . predniSONE  20 mg Oral Daily       PHYSICAL EXAM     Vitals:    05/08/16 1116   BP: 103/60   Pulse: 91   Resp: 16   Temp: (!) 96.4 F (35.8 C)   SpO2: 98%       Temperature: Temp  Min: 95.4 F (35.2 C)  Max: 97.3 F (36.3 C)  Pulse: Pulse  Min: 90  Max: 95  Respiratory: Resp  Min: 16  Max: 18  Non-Invasive BP: BP  Min: 87/66  Max: 103/60  Pulse Oximetry SpO2  Min: 96 %  Max: 99 %    Intake and Output Summary (Last 24 hours) at Date Time    Intake/Output Summary (Last 24 hours) at 05/08/16 1505  Last data filed at 05/08/16 1116   Gross per 24 hour   Intake              740 ml   Output             1000 ml   Net             -260 ml         GEN APPEARANCE: Normal;  A&OX3  HEENT: PERLA; EOMI; Conjunctiva Clear  NECK: Supple; No bruits  CVS: RRR, S1, S2; No M/G/R  LUNGS: CTAB; No Wheezes; No Rhonchi: No rales  ABD: Soft; No TTP; + Normoactive BS  EXT: 2+ edema  Pulses 2+ and intact, both hands are swollen, left more than right, and warm   Skin exam:  no pallor  NEURO: CN 2-12 intact; No Focal neurological deficits  CAP REFILL:  Normal  MENTAL STATUS:  Normal        LABS       Recent Labs  Lab 05/08/16  0444 05/07/16  0418  05/06/16  0545   WBC 8.08 7.54 7.55   RBC 4.68* 4.82 4.99   Hgb 15.1 15.3 16.3   Hematocrit 44.9 45.4 46.9   MCV 95.9 94.2 94.0   Platelets 131* 139* 130*         Recent Labs  Lab 05/08/16  0444 05/07/16  0419 05/06/16  0940 05/05/16  0335 05/04/16  1028   Sodium 137 139 138 135* 137   Potassium 4.2 3.2* 2.8* 3.0* 3.6   Chloride 95* 92* 91* 90* 91*   CO2 28 31* _0 BUN 108.0* 117.0* 121.0* 123.0* 123.0*   Creatinine 3.0* 2.9* 3.1* 3.7* 4.2*   Glucose 98 109* 123* 104* 103*   Calcium 8.8 9.5 9.3 9.5 9.6   Magnesium 1.8 1.8  --   --   --          Recent Labs  Lab 05/08/16  0444 05/03/16  1850   ALT 46 38   AST (SGOT) 74* 67*   Bilirubin, Total 2.8* 3.5*   Bilirubin, Direct 1.4* 1.9*   Albumin 2.9* 3.3*   Alkaline Phosphatase 122* 155*         Recent Labs  Lab 05/04/16  1028 05/04/16  0143 05/03/16  2232   Troponin I 1.50* 1.54* 1.28*             Microbiology Results     Procedure Component Value Units Date/Time    MRSA culture (If not done in Triage) [[id number]] Collected:  05/03/16 2314    Specimen:  Body Fluid from Nasal/Throat ASC Admission Updated:  05/04/16 2337    Narrative:       ORDER#: [id number]                                    ORDERED BY: WHITE, SUSAN  SOURCE: Nares and Throat  COLLECTED:  05/03/16 23:14  ANTIBIOTICS AT COLL.:                                RECEIVED :  05/04/16 05:01  Culture MRSA Surveillance                  FINAL       05/04/16 23:36  05/04/16   Negative for Methicillin Resistant Staph aureus from Nares and             Negative for Methicillin Resistant Staph aureus from Throat             RADIOLOGY     Chest 2 Views    Result Date: 05/03/2016   No acute pulmonary infiltrates are demonstrated. Steward Drone, MD 05/03/2016 8:40 PM     Hand Right Pa Lateral And Oblique    Result Date: 04/29/2016  No acute osseous process. Guy Sandifer, MD 04/29/2016 3:40 AM     Ct Head Without Contrast    Result Date: 04/17/2016   Stable examination of the brain without  evidence of acute intracranial pathology. Please see discussion above for details. Prentice Docker, MD 04/17/2016 2:04 PM     Xr Chest Ap Portable    Result Date: 05/08/2016   Interval placement of right PICC line with the tip overlying SVC. No visible right pneumothorax. Golden Hurter, MD 05/08/2016 2:55 PM     Xr Chest  Ap Portable    Result Date: 04/17/2016   No acute findings. [name], MD 04/17/2016 1:47 PM     US Venous Up Extrem Duplex Dopp Uni Right    Result Date: 05/04/2016  1. Negative for DVT or central venous obstruction of the right upper extremity. 2. Focal chronic appearing superficial thrombophlebitis of the right cephalic vein near the antecubital fossa. The remaining right cephalic vein is patent. Maureen Ralphs, MD 05/04/2016 5:09 PM       Signed,  Dorothy Spark Emila Steinhauser  3:05 PM 05/08/2016

## 2016-05-08 NOTE — Plan of Care (Signed)
Problem: Hemodynamic Status: Cardiac  Goal: Stable vital signs and fluid balance   05/08/16 1300   Goal/Interventions addressed this shift   Stable vital signs and fluid balance Monitor/assess vital signs and telemetry per unit protocol;Weigh on admission and record weight daily;Assess signs and symptoms associated with cardiac rhythm changes;Monitor intake/output per unit protocol and/or LIP order;Monitor lab values;Monitor for leg swelling/edema and report to LIP if abnormal       Problem: Heart Failure  Goal: Stable vital signs and fluid balance   05/08/16 1300   Goal/Interventions addressed this shift   Stable vital signs and fluid balance Monitor/assess vital signs and telemetry per unit protocol;Weigh on admission and record weight daily;Assess signs and symptoms associated with cardiac rhythm changes;Monitor intake/output per unit protocol and/or LIP order;Monitor lab values;Monitor for leg swelling/edema and report to LIP if abnormal       Comments: Patient on fluid restriction,strict I/O,daily weight,bun /creatinine elevated will be started with PRIMACOR DRIP,BE TRANSFER TO IMCU FOR CLOSE MONITORING,REMAINS IN SR.PATIENT BEEN SLEEPING ON THE CHAIR EVEN NIGHT TIME ,"FEELS MORE  COMFORTABLE ON THE CHAIR",NO CHEST PAIN OR SOB,ON RA. PICC LINE WAS PLACED TODAY.EDUCATED ABOUT THE PRIMACORE.

## 2016-05-08 NOTE — Progress Notes (Addendum)
Advanced Heart Failure and Transplant Progress Note  [facility] Failure/Transplant Spectra Link [id number]  I'm on [place] Text, too!    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   Initial response to CyBorD therapy; however, patient has relapsed and recently started revlimid 5 mg PO QOD and dexamethasone 20 mg weekly   Acute on chronic systolic and diastolic heart failure   Low cardiac output   Restrictive cardiac physiology   Acute on chronic renal failure, stage V   Hyperkalemia (treated)   Hyponatremia    Acute gout flare   Overall poor prognosis    Recommendations:     Continue holding diuresis for now (bumetanide 6 mg IV daily at 0800 and 1400) to see patient's response to milrinone   Fluid restrict to 1.5 L/day and Na 2 gm/day   Daily standing weights   No ACE/ARB/ARNI due to severity of renal failure and relative hypotension   No spironolactone due to severity of renal failure   Continue Revlimid; holding dexamethasone while patient is on prednisone   Prednisone taper for treatment of gout   Activity as tolerated   I am concerned that the patient will not tolerate HD (if indicated) due to his restrictive physiology and hypotension    Start milrinone 0.125 mcg/kg/min   Place dual lumen power PICC   Transfer to ICU for initiation of milrinone      ---------------------------------------------------------------------------------------------------------  Subjective:  Patient up in chair, visiting with his wife and niece. The patient denies dyspnea, lightheadedness, abdominal symptoms, anorexia or orthopnea. He continues to have significant leg edema (no diuretics over past 24 hrs).    I reviewed the findings of the [place], focusing on the low CO. His overall prognosis is poor and life expectancy most likely limited to no more than 12 months. I advised a trial of milrinone to see if he has a  symptomatic response. I advised against ICD due his poor prognosis. In addition, I recommended that he has his complex care locally instead of at [facility]. The patient is agreeable to all of the above. Discussed with Dr Elvera Maria, dr Gwenevere Ghazi, and bedside RN.    Objective:    Vital signs in last 24 hours:   Temp:  [95.4 F (35.2 C)-98.2 F (36.8 C)] 97 F (36.1 C)  Heart Rate:  [86-95] 92  Resp Rate:  [16-18] 18  BP: (87-104)/(61-71) 91/64  SpO2: 98 %O2 Device: None (Room air)  Height: 165.1 cm (_0 )  Weight: 60.3 kg (133 lb); Weight change: -1.361 kg (-3 lb)  Body mass index is 22.13 kg/m.[name]    Telemetry: sinus at 80-90s BPM    I/O last 3 completed shifts:  In: 800 [P.O.:800]  Out: 2200 [Urine:2200]                       Drips:     Physical Exam:   General:  alert, appears stated age, cooperative and no distress  HEENT:  Conjunctiva pale, bucal mucosa moist.  JVD - 4 cm above sternal notch, no adenopathy, no carotid bruit and supple, symmetrical, trachea midline.   Lungs:  clear to auscultation bilaterally  Cardiovascular:  regular rate and rhythm, S1, S2 normal, S4 present  Abdominal:  soft, non-tender; bowel sounds normal; no masses,  no organomegaly.  Hepatojular reflux is present.  Extremities:  edema 3+ pitting edema of  the legs bilaterally.  Extremities are cool to the touch.    Neuromuscular exam:  Grossly non-focal though not formally tested.  Normal muscle tone.  Patient able to perform PT and OT.    Cardiac Studies:  Echocardiogram on 05/04/16 shows a left ventricular end-diastolic dimension of 33 mm, concentric LVH (walls 15 mm), global HK with a left ventricular ejection fraction of 20-25%, and grade 3 diastolic dysfunction.  The right ventricle is dilated with reduced systolic function.  The left atrium is dilated and the right atrium is dilated.  The aortic valve is trileaflet without evidence of stenosis; there is trivial insufficiency.  The mitral valve is morphologically normal with mild  regurgitation.  There is mild tricuspid regurgitation.  Pulmonary systolic pressure is estimated to be 30 mmHg above right atrial pressure (10-15 mmHg).  There is trivial pulmonic insufficiency.  A pericardial effusion is not present.    ECG Findings on 05/03/16:  sinus rhythm, left anterior fascicular block and inferior MI, low voltage.  As compared to his previous ECG on 04/17/16 there is no no significant change.      Right heart catheterization on 05/07/16 showed a RA pressure of 8 mmHg, RV pressure of 24/8 mmHg, PA pressure of 29/9/16 mmHg, and a PW pressure of 13 mmHg.  PA saturation was 45.8%.  Cardiac output by Fick calculation was 1.9 L/min with a corresponding index of 1.2 L/min/m2.   Cardiac output by thermodilution was 1.6 L/min with a corresponding index of 0.95 L/min/m2.  PVR is 1.6 WU.    Imaging:  Chest X-Ray: 05/03/16 (images reviewed) no acute process (no pulmonary edema, pleural effusions or air space disease.      aspirin EC 81 mg Oral Daily   fluticasone 2 spray Each Nare Daily   heparin (porcine) 5,000 Units Subcutaneous Q12H [facility]   predniSONE 20 mg Oral Daily     Lab Results   Component Value Date    WBC 8.08 05/08/2016    HGB 15.1 05/08/2016    PLT 131 (L) 05/08/2016    NA 137 05/08/2016    K 4.2 05/08/2016    BUN 108.0 (H) 05/08/2016    CREAT 3.0 (H) 05/08/2016    EGFR 25.3 05/08/2016    MG 1.8 05/08/2016    AST 74 (H) 05/08/2016    ALB 2.9 (L) 05/08/2016    INR 1.3 (H) 04/17/2016    BNP 6,908 (H) 05/03/2016

## 2016-05-08 NOTE — Progress Notes (Signed)
MCR Fx HR, 30D READM: pt from home, multiple health problems, palliative following. Pt transferring to 2519 today for cont tx. DCP for home, spouse to transport.    Royann Shivers, RN  Clinical Case Manager I  [phone]

## 2016-05-08 NOTE — Progress Notes (Signed)
RENAL PROGRESS NOTE    Date Time: 05/08/16 8:57 AM  Patient Name: Gregory [name]  Attending Physician: Cherylann Banas, MD    Assessment:   Acute kidney injury secondary to decompensated congestive heart failure, renal function has improved significantly since admission.  Stage III to 4 chronic kidney disease with only mild proteinuria, unlikely to be amyloid kidney, renal function currently is close to baseline.  Systolic and diastolic congestive heart failure, well diuresed.  Amyloid heart/cardiomyopathy/LV EF around 25 percent  Bilateral lower extremity edema from chronic congestive heart failure, slow to improve.  Hypotension related to severe cardiomyopathy.    Plan:   I had a lengthy discussion with patient and his wife regarding current status of kidney function, likely cause of his kidney problem, course and prognosis of his kidney disease.  Patient and his wife both understand that renal prognosis is entirely dictated by cardiac output.  I will also discussed progression to ESRD and indications for dialysis, etc. the patient does not need any form of dialysis at this time.  Discussed with Dr. May.  Agree with his intention to treat with intravenous milrinone.  No changes to other medication from my viewpoint.  I will continue to follow patient's renal function and electrolytes closely.    Subjective:   Patient reports feeling well  Denies any shortness of breath at rest or orthopnea  He is urinating well without any symptoms  Denies any chest pain or palpitation  Has not noticed much improvement in his leg swelling  Denies any lightheadedness    Review of Systems:   Please see above.  No fever or chills  No abdominal pain, vomiting or diarrhea  No cough or sputum  No new neurological symptoms  No acute joint pain or rash    Meds:     . aspirin EC  81 mg Oral Daily   . fluticasone  2 spray Each Nare Daily   . heparin (porcine)  5,000 U Subcutaneous Q12H [place]   . predniSONE  20 mg Oral Daily     Physical  Exam:     BP 91/64   Pulse 92   Temp 97 F (36.1 C) (Oral)   Resp 18   Ht 1.651 m (_0 )   Wt 60.3 kg (133 lb)   SpO2 98%   BMI 22.13 kg/m     Intake              540 ml   Output             1200 ml   Net             -660 ml     Alert and oriented  HEENT shows no new findings  JVP visible 3 cm  Mucosa moist  Skin turgor is normal  Lungs reveal few fine crackles in the bases  Heart sounds are unchanged, no new murmur  Abdomen is nontender, bowel sounds present  Able to move all 4 limbs  Peripheral edema as before  No other acute findings on extremities, skin or joint    Labs:        05/08/16   0444  05/07/16   0418   WBC  8.08  7.54   Hgb  15.1  15.3   Hematocrit  44.9  45.4   Platelets  131*  139*     Sodium  137  139   Potassium  4.2  3.2*   Chloride  95*  92*  CO2  28  31*   BUN  108.0*  117.0*   Creatinine  3.0*  2.9*   Glucose  98  109*   Calcium  8.8  9.5   Magnesium  1.8  1.8   Phosphorus   --   4.0     AST (SGOT)  74*   ALT  46   Alkaline Phosphatase  122*   Protein, Total  6.2   Albumin  2.9*         Signed by: Gregory [name], [place]

## 2016-05-08 NOTE — Progress Notes (Signed)
Patient went to 2519[facility] ,transported via bed by transporter,report given to  receiving RN.Primacor was tubed down to [employer].

## 2016-05-08 NOTE — Consults (Signed)
Palliative Care Consultation    [facility]    Date Time: 05/08/16 11:20 AM  Patient Name: Gregory [name]  Attending Physician: Cherylann Banas, MD  Date of Admission: 05/03/2016  Referral Date: 05/07/2016  Consult Date: 05/08/2016  Referral Unit: Medical    Assessment/Plan:   Overall - 70 yom with CHF and amyloidosis admitted with AKI and hyperkalemia. Underwent cardiac cath 11/21. Currently about to be transferred to ICU for initiation of milrinone.    Pain/Dyspnea - Denies.    Constipation - Denies.    Advance Care Plan - Full Code. He is able to make his own decisions. [name] wife and niece are bedside. I explained that in the event he is ever unable to make his own decisions, per [place] statute, decision-making would default to his wife [name]. He v/u. I asked if he had ever thought about code status or advance care planning/wishes for his care going forward. He told me no. I presented him with the "Your Right to Decide" booklet and discussed it with him and his family, explaining that it is a good tool to utilize for him to put his wishes for care at this time and going forward into writing. He states he will review this document and complete it with his family. PC will continue to follow for [place] conversations.    Disposition - Pending medical course.    Reason for Consultation:   Palliative care consultation was requested by Dr. Elvera Maria for assistance with management of debility and clarification of goals of care.    Reason for Consult (Chief Complaint): Other Symptom Management debility and Goals of Care  Primary Diagnosis: CHF    History of Present Illness:   28 yof with PMHx gout with recent flare in his hand, CHF with EF 25-30%, amyloidosis, CKD stage 4, HTN, and asthma who presented to [facility] on 05/03/16 with hypokalemia and AKI. He had routine bloodwork done for a visit with his oncologist (who follows him for his amyloidosis) and was called by the lab and instructed to come into  the hospital. He underwent cardiac cath procedure 11/21 for evaluation of filling pressures and cardiac index. PC has been asked to see the patient to assist with dyspnea and clarify GOC.    Past Medical History:     Past Medical History:   Diagnosis Date   . Arthritis     bilat shoulders, lt knee   . Asthma without status asthmaticus     childhood   . Cardiac amyloidosis    . CHF (congestive heart failure)    . Fracture of unspecified bones     1974 lt shoulder   . Gout    . Malignant neoplasm        Past Surgical History:     Past Surgical History:   Procedure Laterality Date   . ARTHROPLASTY, SHOULDER, TOTAL  06/18/2013    Procedure: ARTHROPLASTY, SHOULDER, TOTAL;  Surgeon: Wendi Snipes, MD;  Location: [facility];  Service: Orthopedics;  Laterality: Left;   . COLONOSCOPY     . EXCISION, GANGLION  12/19/2012    Procedure: EXCISION, GANGLION;  Surgeon: Jillyn Hidden, MD;  Location: [facility];  Service: Orthopedics;  Laterality: Right;  injection right and left shoulder   . HERNIA REPAIR      lt inguinal age 77   . INJECTION, MEDICATION  12/19/2012    Procedure: INJECTION, MEDICATION;  Surgeon: Jillyn Hidden, MD;  Location: [facility];  Service: Orthopedics;  Laterality: Bilateral;       Family History:     Family History   Problem Relation Age of Onset   . Family history unknown: Yes       Social History:   Tobacco use: Never smoker  Alcohol use: Yes, rare  Marital Status: Married  Living Situation: Lives with spouse  Psychosocial/Spiritual Supports: Family  Advance Directive: None on file   Code status: Full Code    Designated agent: Milus Glazier    Relationship: spouse    Contact information: [phone], [phone]    Allergies:   No Known Allergies    Medications:   Medication list/MAR reviewed.  Current Facility-Administered Medications   Medication Dose Route Frequency   . aspirin EC  81 mg Oral Daily   . fluticasone  2 spray Each Nare Daily   . heparin (porcine)  5,000 Units  Subcutaneous Q12H [facility]   . predniSONE  20 mg Oral Daily     As needed medications include: acetaminophen, oxyCODONE-acetaminophen, prochlorperazine    Review of Systems:     Review of systems was negative for complaints per 12-system review except as noted above.    Physical Exam:   VS: BP 103/60   Pulse 91   Temp (!) 96.4 F (35.8 C) (Oral)   Resp 16   Ht 1.651 m (_0 )   Wt 60.3 kg (133 lb)   SpO2 98%   BMI 22.13 kg/m   GENERAL: Sitting up in chair, appears fatigued, visiting with his wife and niece.  EYES: PERRLA. Sclera anicteric. Conjunctivae pale.  ENT: Oral mucosa moist. +JVD.  NECK: Trachea midline. Neck veins flat. No adenopathy.  HEART: RRR. Normal S1, S2. No murmur appreciated.  CHEST: Breath sounds are clear bilaterally.  ABDOMEN: Soft. Non-tender. Non-distended. BS+.  EXTREMITIES: 3+ pitting of legs bilaterally. Extremities are cool to the touch.  PSYCH: Alert. Oriented x 3. Appropriate.  NEURO: Grossly non-focal, not formally tested.      Labs/Diagnostics:     Recent Labs  Lab 05/08/16  0444 05/07/16  0419 05/07/16  0418 05/06/16  0940 05/06/16  0545  05/03/16  1850   WBC 8.08  --  7.54  --  7.55 More results in Results Review 5.60   Hgb 15.1  --  15.3  --  16.3 More results in Results Review 15.4   Hematocrit 44.9  --  45.4  --  46.9 More results in Results Review 45.2   Platelets 131*  --  139*  --  130* More results in Results Review 136*   Sodium 137 139  --  138  --  More results in Results Review 134*   Potassium 4.2 3.2*  --  2.8*  --  More results in Results Review 5.6*   Chloride 95* 92*  --  91*  --  More results in Results Review 89*   CO2 28 31*  --  29  --  More results in Results Review 27   BUN 108.0* 117.0*  --  121.0*  --  More results in Results Review 130.0*   Creatinine 3.0* 2.9*  --  3.1*  --  More results in Results Review 5.1*   EGFR 25.3 26.3  --  24.3  --  More results in Results Review 13.7   Glucose 98 109*  --  123*  --  More results in Results Review 90   AST  (SGOT) 74*  --   --   --   --   --  67*   ALT 46  --   --   --   --   --  38   Alkaline Phosphatase 122*  --   --   --   --   --  155*   Albumin 2.9*  --   --   --   --   --  3.3*   Bilirubin, Total 2.8*  --   --   --   --   --  3.5*   More results in Results Review = values in this interval not displayed.  Wt Readings from Last 10 Encounters:   05/08/16 60.3 kg (133 lb)   04/29/16 63.5 kg (140 lb)   04/21/16 64 kg (141 lb)   04/17/16 63.5 kg (139 lb 15.9 oz)   08/26/14 63.5 kg (140 lb)   07/29/14 61.2 kg (135 lb)   04/21/14 70.3 kg (155 lb)   04/01/14 71.7 kg (158 lb)   03/18/14 71.2 kg (157 lb)   06/09/13 77.1 kg (170 lb)       Time in: 1110/Time out: 1200. More than 50% of this visit was spent in face to face consultation and discussion of diagnosis, prognosis, and treatment options.  Signed by: Taft Worthing  N [place], NP  [facility]  [phone]

## 2016-05-09 LAB — CBC
Absolute NRBC: 0 10*3/uL
Hematocrit: 41.1 % — ABNORMAL LOW (ref 42.0–52.0)
Hgb: 14 g/dL (ref 13.0–17.0)
MCH: 32.8 pg — ABNORMAL HIGH (ref 28.0–32.0)
MCHC: 34.1 g/dL (ref 32.0–36.0)
MCV: 96.3 fL (ref 80.0–100.0)
MPV: 11.5 fL (ref 9.4–12.3)
Nucleated RBC: 0 /100 WBC (ref 0.0–1.0)
Platelets: 117 10*3/uL — ABNORMAL LOW (ref 140–400)
RBC: 4.27 10*6/uL — ABNORMAL LOW (ref 4.70–6.00)
RDW: 14 % (ref 12–15)
WBC: 6.89 10*3/uL (ref 3.50–10.80)

## 2016-05-09 LAB — GFR: EGFR: 29.8

## 2016-05-09 LAB — BASIC METABOLIC PANEL
Anion Gap: 14 (ref 5.0–15.0)
BUN: 112 mg/dL — ABNORMAL HIGH (ref 9.0–28.0)
CO2: 27 mEq/L (ref 22–29)
Calcium: 8.8 mg/dL (ref 8.5–10.5)
Chloride: 94 mEq/L — ABNORMAL LOW (ref 100–111)
Creatinine: 2.6 mg/dL — ABNORMAL HIGH (ref 0.7–1.3)
Glucose: 114 mg/dL — ABNORMAL HIGH (ref 70–100)
Potassium: 3.9 mEq/L (ref 3.5–5.1)
Sodium: 135 mEq/L — ABNORMAL LOW (ref 136–145)

## 2016-05-09 LAB — HEMOLYSIS INDEX: Hemolysis Index: 9 (ref 0–18)

## 2016-05-09 MED ORDER — MILRINONE LACTATE IN DEXTROSE 20-5 MG/100ML-% IV SOLN
0.3750 ug/kg/min | INTRAVENOUS | Status: DC
Start: 2016-05-09 — End: 2016-05-13
  Administered 2016-05-10: 0.375 ug/kg/min via INTRAVENOUS
  Administered 2016-05-10: 0.25 ug/kg/min via INTRAVENOUS
  Administered 2016-05-11 – 2016-05-13 (×4): 0.375 ug/kg/min via INTRAVENOUS
  Filled 2016-05-09 (×11): qty 100

## 2016-05-09 NOTE — Plan of Care (Signed)
Problem: Renal Instability  Goal: Fluid and electrolyte balance are achieved/maintained   05/09/16 0902   Goal/Interventions addressed this shift   Fluid and electrolyte balance are achieved/maintained  Monitor intake and output every shift;Monitor/assess lab values and report abnormal values;Provide adequate hydration;Monitor daily weight;Assess for confusion/personality changes;Assess and reassess fluid and electrolyte status;Observe for seizure activity and initiate seizure precautions if indicated;Observe for cardiac arrhythmias;Monitor for muscle weakness;Follow fluid restrictions/IV/PO parameters

## 2016-05-09 NOTE — Plan of Care (Signed)
Problem: Safety  Goal: Patient will be free from injury during hospitalization  Outcome: Progressing      Problem: Pain  Goal: Pain at adequate level as identified by patient  Outcome: Progressing      Problem: Hemodynamic Status: Cardiac  Goal: Stable vital signs and fluid balance  Outcome: Progressing   05/09/16 0125   Goal/Interventions addressed this shift   Stable vital signs and fluid balance Monitor/assess vital signs and telemetry per unit protocol;Weigh on admission and record weight daily;Assess signs and symptoms associated with cardiac rhythm changes;Monitor lab values;Monitor intake/output per unit protocol and/or LIP order;Monitor for leg swelling/edema and report to LIP if abnormal       Problem: Renal Instability  Goal: Fluid and electrolyte balance are achieved/maintained  Outcome: Progressing   05/09/16 0125   Goal/Interventions addressed this shift   Fluid and electrolyte balance are achieved/maintained  Monitor intake and output every shift;Monitor/assess lab values and report abnormal values;Provide adequate hydration;Monitor daily weight;Assess for confusion/personality changes;Assess and reassess fluid and electrolyte status;Observe for cardiac arrhythmias;Monitor for muscle weakness;Observe for seizure activity and initiate seizure precautions if indicated   Patient alert denies pain or discomfort , BP on the low side but stable , NSR on th monitor, strict I/O patient on Milrinone drip, will continue to monitor the patient.     Problem: Heart Failure  Goal: Stable vital signs and fluid balance  Outcome: Progressing   05/09/16 0125   Goal/Interventions addressed this shift   Stable vital signs and fluid balance Monitor/assess vital signs and telemetry per unit protocol;Weigh on admission and record weight daily;Assess signs and symptoms associated with cardiac rhythm changes;Monitor lab values;Monitor intake/output per unit protocol and/or LIP order;Monitor for leg swelling/edema and report to  LIP if abnormal

## 2016-05-09 NOTE — Progress Notes (Signed)
Advanced Heart Failure and Transplant Progress Note  [facility] Failure/Transplant Spectra Link [phone]  I'm on [place] Text, too!    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   Initial response to CyBorD therapy; however, patient has relapsed and recently started revlimid 5 mg PO QOD and dexamethasone 20 mg weekly   Acute on chronic systolic and diastolic heart failure   Low cardiac output   Restrictive cardiac physiology   Acute on chronic renal failure, stage V   Hyperkalemia (treated)   Hyponatremia    Acute gout flare   Overall poor prognosis    Recommendations:     Increase milrinone to 0.25 mcg/kg/min   Continue holding diuresis for now (bumetanide 6 mg IV daily at 0800 and 1400) to see patient's response to milrinone   Fluid restrict to 1.5 L/day and Na 2 gm/day   Daily standing weights   No ACE/ARB/ARNI due to severity of renal failure and relative hypotension   No spironolactone due to severity of renal failure   Continue Revlimid; holding dexamethasone while patient is on prednisone   Prednisone taper for treatment of gout   Activity as tolerated    ---------------------------------------------------------------------------------------------------------  Subjective:  Patient sitting up in bed, reports doing well overnight. He denies any dyspnea, fatigue, or abdominal symptoms. Appetite remains stable. Patient continues to have leg edema. Care reviewed with bedside RN.    Objective:    Vital signs in last 24 hours:   Temp:  [96.4 F (35.8 C)-98 F (36.7 C)] 97.8 F (36.6 C)  Heart Rate:  [88-95] 88  Resp Rate:  [16-18] 18  BP: (91-103)/(60-67) 91/61  SpO2: 97 %O2 Device: None (Room air)  Height: 165.1 cm (_0 )  Weight: 53.3 kg (117 lb 9.6 oz); Weight change: -6.985 kg (-15 lb 6.4 oz)  Body mass index is 19.57 kg/m.[name]    Telemetry: sinus at 80-90s BPM, no VT    I/O last 3  completed shifts:  In: 740 [P.O.:740]  Out: 1650 [Urine:1650]                       Drips:  . milrinone 0.125 mcg/kg/min (05/08/16 1742)     Physical Exam:   General:  alert, appears stated age, cooperative and no distress  HEENT:  Conjunctiva pale, bucal mucosa moist.  JVD - 4 cm above sternal notch, no adenopathy, no carotid bruit and supple, symmetrical, trachea midline.   Lungs:  clear to auscultation bilaterally  Cardiovascular:  regular rate and rhythm, S1, S2 normal, S4 now absent  Abdominal:  soft, non-tender; bowel sounds normal; no masses,  no organomegaly.  Hepatojular reflux is present.  Extremities:  edema 2-3+ pitting edema of the legs bilaterally.  Extremities are warm to the touch.    Neuromuscular exam:  Grossly non-focal though not formally tested.  Normal muscle tone.  Patient able to perform PT and OT.    Cardiac Studies:  Echocardiogram on 05/04/16 shows a left ventricular end-diastolic dimension of 33 mm, concentric LVH (walls 15 mm), global HK with a left ventricular ejection fraction of 20-25%, and grade 3 diastolic dysfunction.  The right ventricle is dilated with reduced systolic function.  The left atrium is dilated and the right atrium is dilated.  The aortic valve is trileaflet without evidence of stenosis; there is trivial insufficiency.  The mitral valve is morphologically  normal with mild regurgitation.  There is mild tricuspid regurgitation.  Pulmonary systolic pressure is estimated to be 30 mmHg above right atrial pressure (10-15 mmHg).  There is trivial pulmonic insufficiency.  A pericardial effusion is not present.    ECG Findings on 05/03/16:  sinus rhythm, left anterior fascicular block and inferior MI, low voltage.  As compared to his previous ECG on 04/17/16 there is no no significant change.      Right heart catheterization on 05/07/16 showed a RA pressure of 8 mmHg, RV pressure of 24/8 mmHg, PA pressure of 29/9/16 mmHg, and a PW pressure of 13 mmHg.  PA saturation was  45.8%.  Cardiac output by Fick calculation was 1.9 L/min with a corresponding index of 1.2 L/min/m2.   Cardiac output by thermodilution was 1.6 L/min with a corresponding index of 0.95 L/min/m2.  PVR is 1.6 WU.    Imaging:  Chest X-Ray: 05/03/16 (images reviewed) no acute process (no pulmonary edema, pleural effusions or air space disease.      aspirin EC 81 mg Oral Daily   colchicine 0.3 mg Oral Daily   fluticasone 2 spray Each Nare Daily   heparin (porcine) 5,000 Units Subcutaneous Q12H [facility]   predniSONE 20 mg Oral Daily     Lab Results   Component Value Date    WBC 6.89 05/09/2016    HGB 14.0 05/09/2016    PLT 117 (L) 05/09/2016    NA 135 (L) 05/09/2016    K 3.9 05/09/2016    BUN 112.0 (H) 05/09/2016    CREAT 2.6 (H) 05/09/2016    EGFR 29.8 05/09/2016    MG 1.8 05/08/2016    AST 74 (H) 05/08/2016    ALB 2.9 (L) 05/08/2016    INR 1.3 (H) 04/17/2016    BNP 6,908 (H) 05/03/2016

## 2016-05-09 NOTE — Progress Notes (Addendum)
[facility]  PROGRESS NOTE      Patient: Gregory [name]  Date: 05/09/2016   LOS: 6 Days  Admission Date: 05/03/2016   MRN: [id number]  Attending: Salena Saner  A Tamario Heal  Please contact me on the following Pager.  [phone]      ASSESSMENT/PLAN         Interval Summary:     Patient Active Hospital Problem List:      Acute on chronic systolic/diastolic congestive heart failure (03/18/2014)  Nonischemic  Amyloid cardiomyopathy with ejection fraction of 20 percent to 25 percent     Assessment: Status post echocardiogram showing left ventricle ejection fraction of 20-25 percent with severe diffuse hypokinesis.  Patient with NYHA class IV.  ACC/AHA stage D, BNP 6908   Hold diuretics  Fluid restriction 1.5 liter/day, low sodium  Bisoprolol and not started due to hypotension  Does not tolerate ACE inhibitor due to hypotension  Avoid Aldactone due to renal failure and hyperkalemia, present on admission  Started milrinone drip low dose to watch for hypotension and response, increase today to 0.25 mcg/kgc/min  PICC line placed  Still hypotensive but asymptomatic  ? Did he really lose 10 kg since admission, asked nurse to recheck weight  No ICD- spoke with Dr May, unlikely to help and might be problematic due to amyloid( no capture)  lifevest  If he does well he will be discharged with milrinone drip at [facility]( [facility]) per Dr May  AKI (acute kidney injury)On chronic kidney disease stage 4(05/04/2016)   improved, creatinine stable after cardiac cath , continues to drop  not a good dialysis candidate due to hypotension and severe amyloid cardiomyopathy  Gout flare up to both hands  Much better with colchicine  Decrease prednisone to 10 mg daily  Elevated troponin (04/21/2014)    Assessment: Likely due to his renal failure and demand ischemia .  Peak troponin at 1.54   Cardiac Amyloidosis    Assessment: Patient with AL cardiac status post CyBorD therapy with relapse now on revlimid     holding dexamethasone while patient is on  prednisone  Thrombocytopenia  Platelet count dropping   Check HIT titer   Abnormal liver function tests-suspect related to passive congestion of the liver due to right-sided heart failure   follow  Up liver function tests are improved      Analgesia: Tylenol    Nutrition: Renal    DVT Prophylaxis: Heparin       Code Status: Full    DISPO: To be determined             SUBJECTIVE   He is optimistic today, walked around, denies any dizziness despite being hypotensive; hands improved, less swollen and painful starting to be able to make a fist.  According with the weight recorded he lost 10 kg since he is here , not sure that is accurate considering a daily and to continue diuretics; his leg edema did improve, though , and he is clearly in negative balance, so we started Millrinone     anMEDICATIONS     Current Facility-Administered Medications   Medication Dose Route Frequency   . aspirin EC  81 mg Oral Daily   . colchicine  0.3 mg Oral Daily   . fluticasone  2 spray Each Nare Daily   . heparin (porcine)  5,000 Units Subcutaneous Q12H [facility]   . predniSONE  20 mg Oral Daily       PHYSICAL EXAM     Vitals:  05/09/16 1104   BP: (!) 86/62   Pulse: 97   Resp: 16   Temp: (!) 95.2 F (35.1 C)   SpO2: 98%       Temperature: Temp  Min: 95.2 F (35.1 C)  Max: 98 F (36.7 C)  Pulse: Pulse  Min: 88  Max: 98  Respiratory: Resp  Min: 16  Max: 20  Non-Invasive BP: BP  Min: 86/62  Max: 99/65  Pulse Oximetry SpO2  Min: 97 %  Max: 100 %    Intake and Output Summary (Last 24 hours) at Date Time    Intake/Output Summary (Last 24 hours) at 05/09/16 1232  Last data filed at 05/09/16 1049   Gross per 24 hour   Intake              120 ml   Output             1300 ml   Net            -1180 ml         GEN APPEARANCE: Normal;  A&OX3  HEENT: PERLA; EOMI; Conjunctiva Clear  NECK: Supple; No bruits  CVS: RRR, S1, S2; No M/G/R  LUNGS: CTAB; No Wheezes; No Rhonchi: No rales  ABD: Soft; No TTP; + Normoactive BS  EXT: 2+ edema  Pulses 2+ and intact,  both hands are swollen, left more than right, and warm   Skin exam:  no pallor  NEURO: CN 2-12 intact; No Focal neurological deficits  CAP REFILL:  Normal  MENTAL STATUS:  Normal        LABS       Recent Labs  Lab 05/09/16  0338 05/08/16  0444 05/07/16  0418   WBC 6.89 8.08 7.54   RBC 4.27* 4.68* 4.82   Hgb 14.0 15.1 15.3   Hematocrit 41.1* 44.9 45.4   MCV 96.3 95.9 94.2   Platelets 117* 131* 139*         Recent Labs  Lab 05/09/16  0338 05/08/16  0444 05/07/16  0419 05/06/16  0940 05/05/16  0335   Sodium 135* 137 139 138 135*   Potassium 3.9 4.2 3.2* 2.8* 3.0*   Chloride 94* 95* 92* 91* 90*   CO2 27 28 31* 29 26   BUN 112.0* 108.0* 117.0* 121.0* 123.0*   Creatinine 2.6* 3.0* 2.9* 3.1* 3.7*   Glucose 114* 98 109* 123* 104*   Calcium 8.8 8.8 9.5 9.3 9.5   Magnesium  --  1.8 1.8  --   --          Recent Labs  Lab 05/08/16  0444 05/03/16  1850   ALT 46 38   AST (SGOT) 74* 67*   Bilirubin, Total 2.8* 3.5*   Bilirubin, Direct 1.4* 1.9*   Albumin 2.9* 3.3*   Alkaline Phosphatase 122* 155*         Recent Labs  Lab 05/04/16  1028 05/04/16  0143 05/03/16  2232   Troponin I 1.50* 1.54* 1.28*             Microbiology Results     Procedure Component Value Units Date/Time    MRSA culture (If not done in Triage) [[id number]] Collected:  05/03/16 2314    Specimen:  Body Fluid from Nasal/Throat ASC Admission Updated:  05/04/16 2337    Narrative:       ORDER#: [id number]  ORDERED BY: WHITE, SUSAN  SOURCE: Nares and Throat                             COLLECTED:  05/03/16 23:14  ANTIBIOTICS AT COLL.:                                RECEIVED :  05/04/16 05:01  Culture MRSA Surveillance                  FINAL       05/04/16 23:36  05/04/16   Negative for Methicillin Resistant Staph aureus from Nares and             Negative for Methicillin Resistant Staph aureus from Throat             RADIOLOGY     Chest 2 Views    Result Date: 05/03/2016   No acute pulmonary infiltrates are demonstrated. Steward Drone,  MD 05/03/2016 8:40 PM     Hand Right Pa Lateral And Oblique    Result Date: 04/29/2016  No acute osseous process. Guy Sandifer, MD 04/29/2016 3:40 AM     Ct Head Without Contrast    Result Date: 04/17/2016   Stable examination of the brain without evidence of acute intracranial pathology. Please see discussion above for details. Prentice Docker, MD 04/17/2016 2:04 PM     Xr Chest Ap Portable    Result Date: 05/08/2016   Interval placement of right PICC line with the tip overlying SVC. No visible right pneumothorax. Golden Hurter, MD 05/08/2016 2:55 PM     Xr Chest  Ap Portable    Result Date: 04/17/2016   No acute findings. [name], MD 04/17/2016 1:47 PM     US Venous Up Extrem Duplex Dopp Uni Right    Result Date: 05/04/2016  1. Negative for DVT or central venous obstruction of the right upper extremity. 2. Focal chronic appearing superficial thrombophlebitis of the right cephalic vein near the antecubital fossa. The remaining right cephalic vein is patent. Maureen Ralphs, MD 05/04/2016 5:09 PM       Signed,  Dorothy Spark Tivis Wherry  12:32 PM 05/09/2016

## 2016-05-09 NOTE — Progress Notes (Signed)
RENAL PROGRESS NOTE    Date Time: 05/09/16 12:13 PM  Patient Name: Gregory [name]  Attending Physician: Cherylann Banas, MD    Assessment:   Acute kidney injury secondary to decompensated congestive heart failure, renal function has further improved now to baseline.  Stage 3-4 chronic kidney disease with estimated GFR around 25 at baseline a few months ago.  More likely secondary to ischemic nephropathy from chronic severe cardiomyopathy than from amyloid disease as his proteinuria is rather low.  Systolic and diastolic congestive heart failure, diuresed.  Off loop diuretics.  Amyloid heart, severe cardiomyopathy, LVEF 25%  Bilateral lower extremity edema from congestive heart failure, slow to improve.  Hypotension related to severe cardiomyopathy, blood pressure remains low, but patient is asymptomatic    Plan:   Continue current dose of milrinone  I will follow urine output  Recheck renal panel daily  Tylenol for pain  Cardiology follow-up    Subjective:   Patient feels better.  No shortness of breath at rest but on minimal exertion.  Maintaining good urine output, even more since starting milrinone.  No lightheadedness.  Blood pressure has been running a little low.  Feels some improvement in his leg swelling.  Has some hand stiffness more as the day progresses.    Review of Systems:   No fever.  No cough.  No chest pain or palpitations.  No urinary complaints.  No vomiting or diarrhea.  No abdominal pain.  No headache.    Meds:     . aspirin EC  81 mg Oral Daily   . colchicine  0.3 mg Oral Daily   . fluticasone  2 spray Each Nare Daily   . heparin (porcine)  5,000 Un Subcutaneous Q12H [facility]   . predniSONE  20 mg Oral Daily     Physical Exam:     BP (!) 86/62   Pulse 97   Temp (!) 95.2 F (35.1 C) (Axillary)   Resp 16   Ht 1.651 m (_0 )   Wt 53.3 kg (117 lb 9.6 oz)   SpO2 98%   BMI 19.57 kg/m     Intake              120 ml   Output             1300 ml   Net            -1180 ml     Alert and  oriented  Nondyspneic, sitting up in bed  No new HEENT findings  JVP not visible  Lungs reveal few basilar crackles, more on the right than left  Heart sounds are unchanged, systolic murmur as before  Abdomen is nontender, bowel sounds are normal  Leg edema is minimally better  No acute findings on joints, skin or extremities  No new neurological findings    Labs:        05/09/16   0338  05/08/16   0444   WBC  6.89  8.08   Hgb  14.0  15.1   Hematocrit  41.1*  44.9   Platelets  117*  131*     Sodium  135*  137  139   Potassium  3.9  4.2  3.2*   Chloride  94*  95*  92*   CO2  27  28  31*   BUN  112.0*  108.0*  117.0*   Creatinine  2.6*  3.0*  2.9*   Glucose  114*  98  109*   Calcium  8.8  8.8  9.5   Magnesium   --   1.8  1.8   Phosphorus   --    --   4.0     AST (SGOT)  74*   ALT  46   Alkaline Phosphatase  122*   Protein, Total  6.2   Albumin  2.9*       Signed by: Jerilynn Som, MD  [phone]

## 2016-05-10 LAB — COMPREHENSIVE METABOLIC PANEL
ALT: 43 U/L (ref 0–55)
AST (SGOT): 71 U/L — ABNORMAL HIGH (ref 5–34)
Albumin/Globulin Ratio: 0.9 (ref 0.9–2.2)
Albumin: 2.9 g/dL — ABNORMAL LOW (ref 3.5–5.0)
Alkaline Phosphatase: 121 U/L — ABNORMAL HIGH (ref 38–106)
Anion Gap: 10 (ref 5.0–15.0)
BUN: 99 mg/dL — ABNORMAL HIGH (ref 9.0–28.0)
Bilirubin, Total: 3.2 mg/dL — ABNORMAL HIGH (ref 0.2–1.2)
CO2: 34 mEq/L — ABNORMAL HIGH (ref 22–29)
Calcium: 9.2 mg/dL (ref 8.5–10.5)
Chloride: 92 mEq/L — ABNORMAL LOW (ref 100–111)
Creatinine: 2.4 mg/dL — ABNORMAL HIGH (ref 0.7–1.3)
Globulin: 3.1 g/dL (ref 2.0–3.6)
Glucose: 100 mg/dL (ref 70–100)
Potassium: 3.7 mEq/L (ref 3.5–5.1)
Protein, Total: 6 g/dL (ref 6.0–8.3)
Sodium: 136 mEq/L (ref 136–145)

## 2016-05-10 LAB — CBC
Absolute NRBC: 0 10*3/uL
Hematocrit: 41 % — ABNORMAL LOW (ref 42.0–52.0)
Hgb: 13.5 g/dL (ref 13.0–17.0)
MCH: 32 pg (ref 28.0–32.0)
MCHC: 32.9 g/dL (ref 32.0–36.0)
MCV: 97.2 fL (ref 80.0–100.0)
MPV: 11.7 fL (ref 9.4–12.3)
Nucleated RBC: 0 /100 WBC (ref 0.0–1.0)
Platelets: 127 10*3/uL — ABNORMAL LOW (ref 140–400)
RBC: 4.22 10*6/uL — ABNORMAL LOW (ref 4.70–6.00)
RDW: 14 % (ref 12–15)
WBC: 6.85 10*3/uL (ref 3.50–10.80)

## 2016-05-10 LAB — HEMOLYSIS INDEX: Hemolysis Index: 10 (ref 0–18)

## 2016-05-10 LAB — GFR: EGFR: 32.7

## 2016-05-10 LAB — BILIRUBIN, DIRECT: Bilirubin Direct: 1.5 mg/dL — ABNORMAL HIGH (ref 0.0–0.5)

## 2016-05-10 LAB — MAGNESIUM: Magnesium: 2 mg/dL (ref 1.6–2.6)

## 2016-05-10 MED ORDER — BUMETANIDE 1 MG PO TABS
4.0000 mg | ORAL_TABLET | Freq: Two times a day (BID) | ORAL | Status: DC
Start: 2016-05-10 — End: 2016-05-13
  Administered 2016-05-10 – 2016-05-13 (×7): 4 mg via ORAL
  Filled 2016-05-10 (×9): qty 4

## 2016-05-10 NOTE — UM Notes (Signed)
Primary Payor: [place] PART A AND B   Secondary Payor: [employer]   Concurrent 05/10/16    Gregory [name] is a 70 y.o. male admitted with AKI (acute kidney injury)    BP 93/55   Pulse (!) 102   Temp (!) 96.8 F (36 C) (Oral)   Resp 18   Ht 1.651 m (_0 )   Wt 53.8 kg (118 lb 8 oz)   SpO2 99%   BMI 19.72 kg/m     Temp:  [95 F (35 C)-97.3 F (36.3 C)]   Heart Rate:  [89-103]   Resp Rate:  [17-20]   BP: (85-101)/(53-72)   SpO2:  [95 %-99 %]   Weight:  [53.8 kg (118 lb 8 oz)]     Lab Results last 48 Hours     Procedure Component Value Units Date/Time    Bilirubin, direct [[id number]]  (Abnormal) Collected:  05/10/16 0457     Updated:  05/10/16 0641     Bilirubin, Direct 1.5 (H) mg/dL     GFR [[id number]] Collected:  05/10/16 0457     Updated:  05/10/16 0624     EGFR 32.7    Comprehensive metabolic panel [[id number]]  (Abnormal) Collected:  05/10/16 0457     BUN 99.0 (H) mg/dL      Creatinine 2.4 (H) mg/dL      Chloride 92 (L) mEq/L      CO2 34 (H) mEq/L      Albumin 2.9 (L) g/dL      AST (SGOT) 71 (H) U/L      Alkaline Phosphatase 121 (H) U/L      Bilirubin, Total 3.2 (H) mg/dL     CBC without differential [[id number]]  (Abnormal) Collected:  05/10/16 0457     Hematocrit 41.0 (L) %      Platelets 127 (L) x10 3/uL      RBC 4.22 (L) x10 6/uL     Basic Metabolic Panel [[id number]]  (Abnormal) Collected:  05/09/16 0338    Specimen:  Blood Updated:  05/09/16 0430     Glucose 114 (H) mg/dL      BUN 112.0 (H) mg/dL      Creatinine 2.6 (H) mg/dL      Sodium 135 (L) mEq/L      Chloride 94 (L) mEq/L     GFR [[id number]] Collected:  05/09/16 0338     EGFR 29.8    CBC without differential [[id number]]  (Abnormal) Collected:  05/09/16 0338     Hematocrit 41.1 (L) %      Platelets 117 (L) x10 3/uL      RBC 4.27 (L) x10 6/uL      MCH 32.8 (H) pg         MD note 05/10/16  Patient Active Hospital Problem List:   Acute on chronic systolic/diastolic congestive heart failure (03/18/2014)  Nonischemic   Amyloid cardiomyopathy with ejection fraction of 20 percent to 25 percent     Assessment: Status post echocardiogram showing left ventricle ejection fraction of 20-25 percent with severe diffuse hypokinesis.  Patient with NYHA class IV.  ACC/AHA stage D, BNP 6908   Hold diuretics  Fluid restriction 1.5 liter/day, low sodium  Bisoprolol and not started due to hypotension  Does not tolerate ACE inhibitor due to hypotension  Avoid Aldactone due to renal failure and hyperkalemia, present on admission  Started milrinone drip low dose to watch for hypotension and response, increase today to 0.375 mcg/kgc/min  PICC line placed  Still  hypotensive but asymptomatic  Restart bumex 4 mg BID  No ICD- spoke with Dr May, unlikely to help and might be problematic due to amyloid( no capture)  lifevest  Plan to discharge on milrinone drip on Monday per Dr May  AKI (acute kidney injury)On chronic kidney disease stage 4(05/04/2016)   improved, creatinine stable after cardiac cath , continues to drop  not a good dialysis candidate due to hypotension and severe amyloid cardiomyopathy  Gout flare up to both hands  Much better with colchicine  Decrease prednisone to 10 mg daily  Elevated troponin (04/21/2014)    Assessment: Likely due to his renal failure and demand ischemia .  Peak troponin at 1.54   Cardiac Amyloidosis    Assessment: Patient with AL cardiac status post CyBorD therapy with relapse now on revlimid     holding dexamethasone while patient is on prednisone  Thrombocytopenia  Platelet count Better  Check HIT titer   Abnormal liver function tests-suspect related to passive congestion of the liver due to right-sided heart failure   follow  Up liver function tests are improved  Analgesia: Tylenol  Nutrition: Renal  DVT Prophylaxis: Heparin  Code Status: Full  DISPO: To be determined    Scheduled Meds:  Current Facility-Administered Medications   Medication Dose Route Frequency   . aspirin EC  81 mg Oral Daily   . bumetanide  4 mg  Oral BID   . colchicine  0.3 mg Oral Daily   . fluticasone  2 spray Each Nare Daily   . heparin (porcine)  5,000 Units Subcutaneous Q12H [facility]   . predniSONE  20 mg Oral Daily     Continuous Infusions:  . milrinone 0.375 mcg/kg/min (05/10/16 1918)     PRN Meds:.acetaminophen, oxyCODONE-acetaminophen, prochlorperazine    Shanon Payor,  BSN, RN  Utilization Review Case Manager  Case Management Department  [facility]  T [phone] Jerilynn Mages [phone]   Please submit all clinical review request via fax to [phone]

## 2016-05-10 NOTE — Plan of Care (Signed)
Problem: Moderate/High Fall Risk Score >5  Goal: Patient will remain free of falls  Outcome: Progressing   05/10/16 0800   OTHER   High (Greater than 13) HIGH-Initiate use of floor mats as appropriate;HIGH-Apply yellow "Fall Risk" arm band;HIGH-(VH Only) Yellow slippers;HIGH-Activate bed exit alarm where available       Problem: Safety  Goal: Patient will be free from injury during hospitalization  Outcome: Progressing   05/10/16 1738   Goal/Interventions addressed this shift   Patient will be free from injury during hospitalization  Assess patient's risk for falls and implement fall prevention plan of care per policy;Provide and maintain safe environment;Hourly rounding;Assess for patients risk for elopement and implement Elopement Risk Plan per policy;Use appropriate transfer methods     Goal: Patient will be free from infection during hospitalization  Outcome: Progressing   05/10/16 1738   Goal/Interventions addressed this shift   Free from Infection during hospitalization Monitor lab/diagnostic results;Monitor all insertion sites (i.e. indwelling lines, tubes, urinary catheters, and drains);Assess and monitor for signs and symptoms of infection       Problem: Pain  Goal: Pain at adequate level as identified by patient  Outcome: Progressing   05/10/16 1738   Goal/Interventions addressed this shift   Pain at adequate level as identified by patient Assess pain on admission, during daily assessment and/or before any "as needed" intervention(s);Evaluate if patient comfort function goal is met       Problem: Psychosocial and Spiritual Needs  Goal: Demonstrates ability to cope with hospitalization/illness  Outcome: Progressing   05/10/16 1738   Goal/Interventions addressed this shift   Demonstrates ability to cope with hospitalizations/illness Encourage verbalization of feelings/concerns/expectations;Provide quiet environment       Problem: Inadequate Tissue Perfusion  Goal: Adequate tissue perfusion will be  maintained  Outcome: Progressing   05/10/16 1738   Goal/Interventions addressed this shift   Adequate tissue perfusion will be maintained Monitor/assess neurovascular status (pulses, capillary refill, pain, paresthesia, paralysis, presence of edema);VTE Prevention: Administer anticoagulant(s) and/or apply anti-embolism stockings/devices as ordered;Increase mobility as tolerated/progressive mobility;Elevate feet;Assess and monitor skin integrity;Monitor/assess vital signs       Problem: Impaired Mobility  Goal: Mobility/Activity is maintained at optimal level for patient  Outcome: Progressing   05/10/16 1738   Goal/Interventions addressed this shift   Mobility/activity is maintained at optimal level for patient Increase mobility as tolerated/progressive mobility       Problem: Nutrition  Goal: Nutritional intake is adequate  Outcome: Progressing   05/10/16 1738   Goal/Interventions addressed this shift   Nutritional intake is adequate Allow adequate time for meals;Monitor daily weights       Problem: Renal Instability  Goal: Nutritional intake is adequate  Outcome: Progressing   05/10/16 1738   Goal/Interventions addressed this shift   Nutritional intake is adequate Allow adequate time for meals;Monitor daily weights       Problem: Heart Failure  Goal: Mobility/Activity is maintained at optimal level for patient  Outcome: Progressing   05/10/16 1738   Goal/Interventions addressed this shift   Mobility/activity is maintained at optimal level for patient Increase mobility as tolerated/progressive mobility     Goal: Nutritional intake is adequate  Outcome: Progressing   05/10/16 1738   Goal/Interventions addressed this shift   Nutritional intake is adequate Allow adequate time for meals;Monitor daily weights       Problem: Post-op Phase - Cardiac Surgery  Goal: Nutritional intake is adequate  Outcome: Progressing   05/10/16 1738   Goal/Interventions addressed this  shift   Nutritional intake is adequate Allow adequate  time for meals;Monitor daily weights       Comments: Pt remained safe during shift. Hourly rounding implemented and bed alarm remained on. Cardiac output monitored through vital signs and assessment of pulses and perfusion. New orders given for strict input/output and daily monitoring of weight. Primacor rate/dose adjusted and validated with another nurse. Pt allowed encouraged to verbalize feelings and express emotions regarding passing of friend. Attending contacted regarding early d/c per patient request so they may attend funeral of friend. P/t will be ambulated per MD order to see tolerance to Primacor drip.

## 2016-05-10 NOTE — Progress Notes (Signed)
Advanced Heart Failure and Transplant Progress Note  [facility] Failure/Transplant Spectra Link [id number]  I'm on [place] Text, too!    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   Initial response to CyBorD therapy; however, patient has relapsed and recently started revlimid 5 mg PO QOD and dexamethasone 20 mg weekly   Acute on chronic systolic and diastolic heart failure   Low cardiac output   Restrictive cardiac physiology   Acute on chronic renal failure, stage V   Hyperkalemia (treated)   Hyponatremia    Mild thrombocytopenia   Acute gout flare   Overall poor prognosis    Recommendations:     Increase milrinone to 0.375 mcg/kg/min   Restart bumetanide 4 mg PO daily at 0800 and 1400   Fluid restrict to 1.5 L/day and Na 2 gm/day   Daily standing weights   No beta blocker due to relative hypotension   No ACE/ARB/ARNI due to severity of renal failure and relative hypotension   No spironolactone due to severity of renal failure   Continue Revlimid; holding dexamethasone while patient is on prednisone   Prednisone taper for treatment of gout   Activity as tolerated   Face to face completed 05/10/16    ---------------------------------------------------------------------------------------------------------  Subjective:  Patient sitting up in bed, reports doing well overnight. He has not ambulated in the past two days, so cannot fully assess his response to milrinone. He denies dyspnea, lightheadedness, abdominal symptoms. His hands and feet are less painful. Appetite stable. Reviewed care with Dr Elvera Maria and bedside RN.    Objective:    Vital signs in last 24 hours:   Temp:  [95.1 F (35.1 C)-96.1 F (35.6 C)] 95.1 F (35.1 C)  Heart Rate:  [81-98] 95  Resp Rate:  [16-20] 17  BP: (85-96)/(53-72) 93/72  SpO2: 98 %O2 Device: Nasal cannula  Height: 165.1 cm (_0 )  Weight: 53.8 kg (118 lb 8  oz); Weight change: 0.408 kg (14.4 oz)  Body mass index is 19.72 kg/m.[name]    Telemetry: sinus at 80-90s BPM, no VT    I/O last 3 completed shifts:  In: 120 [P.O.:120]  Out: 1450 [Urine:1450]                       Drips:  . milrinone 0.25 mcg/kg/min (05/10/16 0018)     Physical Exam:   General:  alert, appears stated age, cooperative and no distress  HEENT:  Conjunctiva pale, bucal mucosa moist.  JVD - 4 cm above sternal notch, no adenopathy, no carotid bruit and supple, symmetrical, trachea midline.   Lungs:  clear to auscultation bilaterally  Cardiovascular:  regular rate and rhythm, S1, S2 normal, S4 now absent  Abdominal:  soft, non-tender; bowel sounds normal; no masses,  no organomegaly.  Hepatojular reflux is present.  Extremities:  edema 2-3+ pitting edema of the legs bilaterally.  Extremities are warm to the touch.    Neuromuscular exam:  Grossly non-focal though not formally tested.  Normal muscle tone.  Patient able to perform PT and OT.    Cardiac Studies:  Echocardiogram on 05/04/16 shows a left ventricular end-diastolic dimension of 33 mm, concentric LVH (walls 15 mm), global HK with a left ventricular ejection fraction of 20-25%, and grade 3 diastolic dysfunction.  The right ventricle is dilated with reduced systolic function.  The left atrium is dilated and  the right atrium is dilated.  The aortic valve is trileaflet without evidence of stenosis; there is trivial insufficiency.  The mitral valve is morphologically normal with mild regurgitation.  There is mild tricuspid regurgitation.  Pulmonary systolic pressure is estimated to be 30 mmHg above right atrial pressure (10-15 mmHg).  There is trivial pulmonic insufficiency.  A pericardial effusion is not present.    ECG Findings on 05/03/16:  sinus rhythm, left anterior fascicular block and inferior MI, low voltage.  As compared to his previous ECG on 04/17/16 there is no no significant change.      Right heart catheterization on 05/07/16 showed a RA  pressure of 8 mmHg, RV pressure of 24/8 mmHg, PA pressure of 29/9/16 mmHg, and a PW pressure of 13 mmHg.  PA saturation was 45.8%.  Cardiac output by Fick calculation was 1.9 L/min with a corresponding index of 1.2 L/min/m2.   Cardiac output by thermodilution was 1.6 L/min with a corresponding index of 0.95 L/min/m2.  PVR is 1.6 WU.    Imaging:  Chest X-Ray: 05/03/16 (images reviewed) no acute process (no pulmonary edema, pleural effusions or air space disease.      aspirin EC 81 mg Oral Daily   colchicine 0.3 mg Oral Daily   fluticasone 2 spray Each Nare Daily   heparin (porcine) 5,000 Units Subcutaneous Q12H [facility]   predniSONE 20 mg Oral Daily     Lab Results   Component Value Date    WBC 6.85 05/10/2016    HGB 13.5 05/10/2016    PLT 127 (L) 05/10/2016    NA 136 05/10/2016    K 3.7 05/10/2016    BUN 99.0 (H) 05/10/2016    CREAT 2.4 (H) 05/10/2016    EGFR 32.7 05/10/2016    MG 2.0 05/10/2016    AST 71 (H) 05/10/2016    ALB 2.9 (L) 05/10/2016    INR 1.3 (H) 04/17/2016    BNP 6,908 (H) 05/03/2016

## 2016-05-10 NOTE — Progress Notes (Signed)
Home Health Referral          Referral from Lavone Nian (Case Manager) for home health care upon discharge.    By [employer], the patient has the right to freely choose a home care provider.  Arrangements have been made with:     A company of the patients choosing. We have supplied the patient with a listing of providers in your area who asked to be included and participate in Medicare.   [facility], a home care agency that provides both adult home care services which is a wholly owned and operated by [employer] and participates in [employer]   The preferred provider of your insurance company. Choosing a home care provider other than your insurance company's preferred provider may affect your insurance coverage.    The Home Health Care Referral Form acknowledging the voluntary selection of the home care company has been completed, signed, and is on file.      Home Health Discharge Information     Your doctor has ordered Skilled Nursing in-home service(s) for you while you recuperate at home, to assist you in the transition from hospital to home.      The agency that you or your representative chose to provide the service:   ]        [facility] [place]:  Name of [place]: [facility]- ([place], [place] ([phone])]  Infusion:  Milrinone Start on: Date: Home Infusion will be set up on the day of discharge, prior to leaving the hospital    The above services were set up by:  Rosario Jacks  ([place])   Phone   [phone]         Signed by: Rosario Jacks  Date Time: 05/10/16 2:26 PM

## 2016-05-10 NOTE — Progress Notes (Signed)
Dr. May paged and notified of Pt's Milrinone drip rate dose change being done late. Per MD no new orders or changes for now, continue drip as ordered and monitor Pt.

## 2016-05-10 NOTE — Discharge Instr - AVS First Page (Addendum)
[facility] DISCHARGE INSTRUCTIONS     Date of Admission: 05/03/2016    Date of Discharge: 05/13/2016    Discharge Physician: Dolphus Jenny    Dear Gregory [name],     Thank you for choosing [facility] for your emergency care needs. We strive to provide EXCELLENT care to you and your family.     In an effort to explain clearly why you were here in the hospital, I've written a very brief summary. I hope that you find it useful. Other details including formal diagnosis, medication changes, follow up appointment recommendations, and access to MyChart for formal medical records can be found in this packet.       You were admitted for AKI (acute kidney injury) . Make sure to follow up with  Zoe Lan, MD   Make sure to bring the following to your doctors appointments:  Medications in their original bottles  Glucometer/blood sugar log (if diabetic)   Weight log (if you have heart failure)    Finally, as your discharging physician, you may be receiving a survey which is regarding my care. I would greatly value and appreciate your feedback as I strive for excellence.     Respectfully yours,    Dolphus Jenny MD    Consultants: cardiology, nephrology    Pending Studies: none    Further Instructions:   You were admitted with kidney injury and low blood pressure.  You were treated for gout. You will decrease prednisone every few days.   Your heart failure medications were changed: Bumex was decreased. Aldactone was added. You were started on IV milrinone.   You need to weigh yourself daily and call your cardiologist with changes in your weight. You have as needed zaroxolyn.   You should take in only 2 grams sodium daily and 1.5L only daily.  Return to the hospital for shortness of breath, weakness, dizziness or any other concerns.     Discharge Diet: 1.5 L fluid, low sodium    Surgery/Procedure: cardiac cath      Home Health Discharge Information     Your doctor has ordered Skilled  Nursing in-home service(s) for you while you recuperate at home, to assist you in the transition from hospital to home.      The agency that you or your representative chose to provide the service:      [facility]:  Name of [place]: [employer]- [employer] [place], [place] ([phone])]  Infusion:  Milrinone Start on: Date: Home Infusion will be set up on the day of discharge, prior to leaving the hospital    The above services were set up by:  Rosario Jacks  ([place])   Phone   [phone]

## 2016-05-10 NOTE — Progress Notes (Signed)
S[facility] H[facility]ITAL[facility]  PROGRESS NOTE      Patient: Gregory [name]  Date: 05/10/2016   LOS: 7 Days  Admission Date: 05/03/2016   MRN: [id number]  Attending: Salena Saner  A Kairy Folsom  Please contact me on the following Pager.  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  Patient with NYHA class IV.  ACC/AHA stage D, BNP 6908   Hold diuretics  Fluid restriction 1.5 liter/day, low sodium  Bisoprolol and not started due to hypotension  Does not tolerate ACE inhibitor due to hypotension  Avoid Aldactone due to renal failure and hyperkalemia, present on admission  Started milrinone drip low dose to watch for hypotension and response, increase today to 0.375 mcg/kgc/min  PICC line placed  Still hypotensive but asymptomatic  Restart bumex 4 mg BID  No ICD- spoke with Dr May, unlikely to help and might be problematic due to amyloid( no capture)  lifevest  Plan to discharge on milrinone drip on Monday per Dr May  AKI (acute kidney injury)On chronic kidney disease stage 4(05/04/2016)   improved, creatinine stable after cardiac cath , continues to drop  not a good dialysis candidate due to hypotension and severe amyloid cardiomyopathy  Gout flare up to both hands  Much better with colchicine  Decrease prednisone to 10 mg daily  Elevated troponin (04/21/2014)    Assessment: Likely due to his renal failure and demand ischemia .  Peak troponin at 1.54   Cardiac Amyloidosis    Assessment: Patient with AL cardiac status post CyBorD therapy with relapse now on revlimid     holding dexamethasone while patient is on prednisone  Thrombocytopenia  Platelet count Better  Check HIT titer   Abnormal  liver function tests-suspect related to passive congestion of the liver due to right-sided heart failure   follow  Up liver function tests are improved      Analgesia: Tylenol    Nutrition: Renal    DVT Prophylaxis: Heparin       Code Status: Full    DISPO: To be determined             SUBJECTIVE   He Feels better today, is able to make a fist with left hand , right hand still swollen to some degree .feet appear puffier today to me , has no complaints of shortness of breath or chest pain , he feels optimistic      nMEDICATIONS     Current Facility-Administered Medications   Medication Dose Route Frequency   . aspirin EC  81 mg Oral Daily   . bumetanide  4 mg Oral BID   . colchicine  0.3 mg Oral Daily   . fluticasone  2 spray Each Nare Daily   . heparin (porcine)  5,000 Units Subcutaneous Q12H [facility]   . predniSONE  20 mg Oral Daily       PHYSICAL EXAM     Vitals:    05/10/16 1241   BP: 101/67   Pulse: 98   Resp:    Temp:    SpO2:        Temperature: Temp  Min: 95  F (35 C)  Max: 97.3 F (36.3 C)  Pulse: Pulse  Min: 81  Max: 103  Respiratory: Resp  Min: 17  Max: 20  Non-Invasive BP: BP  Min: 85/62  Max: 101/67  Pulse Oximetry SpO2  Min: 95 %  Max: 99 %    Intake and Output Summary (Last 24 hours) at Date Time    Intake/Output Summary (Last 24 hours) at 05/10/16 1402  Last data filed at 05/10/16 1243   Gross per 24 hour   Intake              450 ml   Output              600 ml   Net             -150 ml         GEN APPEARANCE: Normal;  A&OX3  HEENT: PERLA; EOMI; Conjunctiva Clear  NECK: Supple; No bruits  CVS: RRR, S1, S2; No M/G/R  LUNGS: CTAB; No Wheezes; No Rhonchi: No rales  ABD: Soft; No TTP; + Normoactive BS  EXT: 2+ edema  Pulses 2+ and intact, both hands are swollen, left more than right, and warm   Skin exam:  no pallor  NEURO: CN 2-12 intact; No Focal neurological deficits  CAP REFILL:  Normal  MENTAL STATUS:  Normal        LABS       Recent Labs  Lab 05/10/16  0457 05/09/16  0338 05/08/16  0444   WBC 6.85  6.89 8.08   RBC 4.22* 4.27* 4.68*   Hgb 13.5 14.0 15.1   Hematocrit 41.0* 41.1* 44.9   MCV 97.2 96.3 95.9   Platelets 127* 117* 131*         Recent Labs  Lab 05/10/16  0457 05/09/16  0338 05/08/16  0444 05/07/16  0419 05/06/16  0940   Sodium 136 135* 137 139 138   Potassium 3.7 3.9 4.2 3.2* 2.8*   Chloride 92* 94* 95* 92* 91*   CO2 34* 27 28 31* 29   BUN 99.0* 112.0* 108.0* 117.0* 121.0*   Creatinine 2.4* 2.6* 3.0* 2.9* 3.1*   Glucose 100 114* 98 109* 123*   Calcium 9.2 8.8 8.8 9.5 9.3   Magnesium 2.0  --  1.8 1.8  --          Recent Labs  Lab 05/10/16  0457 05/08/16  0444 05/03/16  1850   ALT 43 46 38   AST (SGOT) 71* 74* 67*   Bilirubin, Total 3.2* 2.8* 3.5*   Bilirubin, Direct 1.5* 1.4* 1.9*   Albumin 2.9* 2.9* 3.3*   Alkaline Phosphatase 121* 122* 155*         Recent Labs  Lab 05/04/16  1028 05/04/16  0143 05/03/16  2232   Troponin I 1.50* 1.54* 1.28*             Microbiology Results     Procedure Component Value Units Date/Time    MRSA culture (If not done in Triage) [[id number]] Collected:  05/03/16 2314    Specimen:  Body Fluid from Nasal/Throat ASC Admission Updated:  05/04/16 2337    Narrative:       ORDER#: [id number]                                    ORDERED BY: WHITE, SUSAN  SOURCE: Nares and Throat  COLLECTED:  05/03/16 23:14  ANTIBIOTICS AT COLL.:                                RECEIVED :  05/04/16 05:01  Culture MRSA Surveillance                  FINAL       05/04/16 23:36  05/04/16   Negative for Methicillin Resistant Staph aureus from Nares and             Negative for Methicillin Resistant Staph aureus from Throat             RADIOLOGY     Chest 2 Views    Result Date: 05/03/2016   No acute pulmonary infiltrates are demonstrated. Steward Drone, MD 05/03/2016 8:40 PM     Hand Right Pa Lateral And Oblique    Result Date: 04/29/2016  No acute osseous process. Guy Sandifer, MD 04/29/2016 3:40 AM     Ct Head Without Contrast    Result Date: 04/17/2016   Stable examination of  the brain without evidence of acute intracranial pathology. Please see discussion above for details. Prentice Docker, MD 04/17/2016 2:04 PM     Xr Chest Ap Portable    Result Date: 05/08/2016   Interval placement of right PICC line with the tip overlying SVC. No visible right pneumothorax. Golden Hurter, MD 05/08/2016 2:55 PM     Xr Chest  Ap Portable    Result Date: 04/17/2016   No acute findings. [name], MD 04/17/2016 1:47 PM     US Venous Up Extrem Duplex Dopp Uni Right    Result Date: 05/04/2016  1. Negative for DVT or central venous obstruction of the right upper extremity. 2. Focal chronic appearing superficial thrombophlebitis of the right cephalic vein near the antecubital fossa. The remaining right cephalic vein is patent. Maureen Ralphs, MD 05/04/2016 5:09 PM       Signed,  Dorothy Spark Leela Vanbrocklin  2:02 PM 05/10/2016

## 2016-05-10 NOTE — Plan of Care (Signed)
Problem: Safety  Goal: Patient will be free from injury during hospitalization  Outcome: Progressing   05/10/16 0417   Goal/Interventions addressed this shift   Patient will be free from injury during hospitalization  Assess patient's risk for falls and implement fall prevention plan of care per policy;Provide and maintain safe environment;Use appropriate transfer methods;Include patient/ family/ care giver in decisions related to safety;Ensure appropriate safety devices are available at the bedside;Hourly rounding     Goal: Patient will be free from infection during hospitalization  Outcome: Progressing   05/10/16 0417   Goal/Interventions addressed this shift   Free from Infection during hospitalization Assess and monitor for signs and symptoms of infection       Problem: Pain  Goal: Pain at adequate level as identified by patient  Outcome: Progressing   05/10/16 0417   Goal/Interventions addressed this shift   Pain at adequate level as identified by patient Identify patient comfort function goal;Assess for risk of opioid induced respiratory depression, including snoring/sleep apnea. Alert healthcare team of risk factors identified.;Assess pain on admission, during daily assessment and/or before any "as needed" intervention(s);Reassess pain within 30-60 minutes of any procedure/intervention, per Pain Assessment, Intervention, Reassessment (AIR) Cycle;Evaluate patient's satisfaction with pain management progress;Offer non-pharmacological pain management interventions

## 2016-05-10 NOTE — Progress Notes (Signed)
RENAL PROGRESS NOTE    Date Time: 05/10/16 11:34 AM  Patient Name: Gregory [name] T  Attending Physician: Cherylann Banas, MD    Assessment:   Acute kidney injury secondary to decompensated biventricular failure, creatinine has further improved a little.  Stage 3-4 chronic kidney disease likely secondary to chronic ischemic nephropathy related to severe cardiomyopathy and poor cardiac output.  Systolic and diastolic congestive heart failure, on diuretics and milrinone.  Amyloid heart with severe cardiomyopathy, LVEF 25%.  Lower extremity edema slowly improved.  Hypotension related to cardiomyopathy.    Plan:   Continue current dose of Bumex and milrinone.  I will follow electrolyte levels and replace if necessary.  Magnesium/potassium levels is normal.  Continue to follow urine output and daily weight.  Renal panel again tomorrow    Subjective:   Reports feeling better.  Not short of breath at rest.  He has not walked this morning.  Orthopnea present  Able to take orally  Tolerating milrinone  On Bumex also.    Review of Systems:   No fever or chills  No cough  No chest pain or palpitation  No abdominal pain  No vomiting or diarrhea  No headache or lightheadedness  No new rash or joint pain  All other systems reviewed and found negative for new problems    Meds:     . aspirin EC  81 mg Oral Daily   . bumetanide  4 mg Oral BID   . colchicine  0.3 mg Oral Daily   . fluticasone  2 spray Each Nare Daily   . heparin (porcine)  5,000 Un Subcutaneous Q12H [facility]   . predniSONE  20 mg Oral Daily     Physical Exam:     BP 96/66   Pulse 100   Temp (!) 95 F (35 C) (Axillary)   Resp 18   Ht 1.651 m (_0 )   Wt 53.8 kg (118 lb 8 oz)   SpO2 96%   BMI 19.72 kg/m     Intake/Output Summary incomplete  Intake                0 ml   Output              600 ml   Net             -600 ml     Alert and oriented  Nondyspneic at rest  No new HEENT findings  JVD as before  Lungs reveal few bibasilar crackles  Heart sounds are  unchanged, no new murmur  Abdomen is nontender, bowel sounds present  No new neurological findings  No other acute findings on skin, joints or extremities  Leg edema is minimally better    Labs:        05/10/16   0457  05/09/16   0338   WBC  6.85  6.89   Hgb  13.5  14.0   Hematocrit  41.0*  41.1*   Platelets  127*  117*     Sodium  136  135*  137   Potassium  3.7  3.9  4.2   Chloride  92*  94*  95*   CO2  34*  27  28   BUN  99.0*  112.0*  108.0*   Creatinine  2.4*  2.6*  3.0*   Glucose  100  114*  98   Calcium  9.2  8.8  8.8   Magnesium  2.0   --  1.8     AST (SGOT)  71*  74*   ALT  43  46   Alkaline Phosphatase  121*  122*   Protein, Total  6.0  6.2   Albumin  2.9*  2.9*         Signed by: Jerilynn Som, [place] [phone]

## 2016-05-11 LAB — CBC
Absolute NRBC: 0 10*3/uL
Hematocrit: 38 % — ABNORMAL LOW (ref 42.0–52.0)
Hgb: 12.7 g/dL — ABNORMAL LOW (ref 13.0–17.0)
MCH: 32.6 pg — ABNORMAL HIGH (ref 28.0–32.0)
MCHC: 33.4 g/dL (ref 32.0–36.0)
MCV: 97.4 fL (ref 80.0–100.0)
MPV: 11.9 fL (ref 9.4–12.3)
Nucleated RBC: 0 /100 WBC (ref 0.0–1.0)
Platelets: 122 10*3/uL — ABNORMAL LOW (ref 140–400)
RBC: 3.9 10*6/uL — ABNORMAL LOW (ref 4.70–6.00)
RDW: 14 % (ref 12–15)
WBC: 6.57 10*3/uL (ref 3.50–10.80)

## 2016-05-11 LAB — BASIC METABOLIC PANEL
Anion Gap: 11 (ref 5.0–15.0)
BUN: 89 mg/dL — ABNORMAL HIGH (ref 9.0–28.0)
CO2: 34 mEq/L — ABNORMAL HIGH (ref 22–29)
Calcium: 9 mg/dL (ref 8.5–10.5)
Chloride: 93 mEq/L — ABNORMAL LOW (ref 100–111)
Creatinine: 2.3 mg/dL — ABNORMAL HIGH (ref 0.7–1.3)
Glucose: 104 mg/dL — ABNORMAL HIGH (ref 70–100)
Potassium: 3.9 mEq/L (ref 3.5–5.1)
Sodium: 138 mEq/L (ref 136–145)

## 2016-05-11 LAB — HEMOLYSIS INDEX: Hemolysis Index: 8 (ref 0–18)

## 2016-05-11 LAB — HEPARIN-INDUCED PLATELET ANTIBODY
Heparin Induced Platelet Antibody: NEGATIVE
Patient Optical Density: 0.099

## 2016-05-11 LAB — GFR: EGFR: 34.3

## 2016-05-11 MED ORDER — SPIRONOLACTONE 25 MG PO TABS
12.5000 mg | ORAL_TABLET | Freq: Every day | ORAL | Status: DC
Start: 2016-05-11 — End: 2016-05-13
  Administered 2016-05-13: 12.5 mg via ORAL
  Filled 2016-05-11 (×2): qty 1

## 2016-05-11 NOTE — Progress Notes (Signed)
S[facility] H[facility]PITAL[facility]  PROGRESS NOTE      Patient: Gregory [name]  Date: 05/11/2016   LOS: 8 Days  Admission Date: 05/03/2016   MRN: [id number]  Attending: Dolphus Jenny  Please contact me on the following pager [phone]       ASSESSMENT/PLAN     [name] is a 70 y.o. 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  Patient with NYHA class IV.  ACC/AHA stage D, BNP 6908   bumex resumed. Aldactone resumed.   Fluid restriction 1.5 liter/day, low sodium  Bisoprolol and not started due to hypotension  Does not tolerate ACE inhibitor due to hypotension  Started milrinone drip low dose to watch for hypotension and response,to 0.375 mcg/kgc/min  PICC line placed  Still hypotensive but asymptomatic  No ICD- Dr Elvera Maria spoke with Dr May, unlikely to help and might be problematic due to amyloid( no capture)  lifevest  Plan to discharge on milrinone drip on Monday per Dr May    AKI (acute kidney injury)On chronic kidney disease stage 4(05/04/2016)   improved, creatinine stable after cardiac cath , continues to drop  not a good dialysis candidate due to hypotension and severe amyloid cardiomyopathy    Gout flare up to both hands  Much better with colchicine  Decrease prednisone to 10 mg daily    Elevated troponin (04/21/2014)    Assessment: Likely due to his renal failure and demand ischemia .  Peak troponin at 1.54     Cardiac Amyloidosis    Assessment: Patient with AL cardiac status post CyBorD therapy with relapse now on revlimid     holding dexamethasone while patient is on prednisone    Thrombocytopenia  Platelet count Better  Check HIT titer     Abnormal liver function  tests-suspect related to passive congestion of the liver due to right-sided heart failure   follow  Up liver function tests are improved       Nutrition: low sodium cardiac    DVT Prophylaxis:heparin       Code Status: full    DISPO: home monday    Family Contact: none       SUBJECTIVE     Gregory [name] states feels well. No sob chest pain abd pain. Leg edema improved. Some gout in hands but improving pain and swelling.     MEDICATIONS     Current Facility-Administered Medications   Medication Dose Route Frequency   . aspirin EC  81 mg Oral Daily   . bumetanide  4 mg Oral BID   . colchicine  0.3 mg Oral Daily   . fluticasone  2 spray Each Nare Daily   . heparin (porcine)  5,000 Units Subcutaneous Q12H [facility]   . predniSONE  20 mg Oral Daily   . spironolactone  12.5 mg Oral Daily       PHYSICAL EXAM     Vitals:    05/11/16 1608   BP: 98/62   Pulse: (!) 111   Resp: 18   Temp: (!) 96.3 F (35.7 C)   SpO2: 95%       Temperature: Temp  Min: 96.3 F (  35.7 C)  Max: 97.7 F (36.5 C)  Pulse: Pulse  Min: 101  Max: 111  Respiratory: Resp  Min: 18  Max: 20  Non-Invasive BP: BP  Min: 90/59  Max: 102/66  Pulse Oximetry SpO2  Min: 95 %  Max: 99 %    Intake and Output Summary (Last 24 hours) at Date Time    Intake/Output Summary (Last 24 hours) at 05/11/16 1846  Last data filed at 05/11/16 1700   Gross per 24 hour   Intake              500 ml   Output              800 ml   Net             -300 ml     GEN APPEARANCE: Normal;  A&OX3 high spirits  HEENT: PERLA; EOMI; Conjunctiva Clear  NECK: Supple; No bruits  CVS: RRR, S1, S2; No M/G/R  LUNGS: CTAB; No Wheezes; No Rhonchi: No rales  ABD: Soft; No TTP; + Normoactive BS  OEC:XFQHK bilateral edema; Pulses 2+ and intact. Hands with swelling bilateral hands.   Skin exam:  no pallor  NEURO: CN 2-12 intact; No Focal neurological deficits  CAP REFILL:  Normal  MENTAL STATUS:  Normal    Exam done by Dolphus Jenny, MD on 05/11/16 at 6:46 PM      LABS       Recent Labs  Lab 05/11/16  0357  05/10/16  0457 05/09/16  0338   WBC 6.57 6.85 6.89   RBC 3.90* 4.22* 4.27*   Hgb 12.7* 13.5 14.0   Hematocrit 38.0* 41.0* 41.1*   MCV 97.4 97.2 96.3   Platelets 122* 127* 117*         Recent Labs  Lab 05/11/16  0357 05/10/16  0457 05/09/16  0338 05/08/16  0444 05/07/16  0419   Sodium 138 136 135* 137 139   Potassium 3.9 3.7 3.9 4.2 3.2*   Chloride 93* 92* 94* 95* 92*   CO2 34* 34* 27 28 31*   BUN 89.0* 99.0* 112.0* 108.0* 117.0*   Creatinine 2.3* 2.4* 2.6* 3.0* 2.9*   Glucose 104* 100 114* 98 109*   Calcium 9.0 9.2 8.8 8.8 9.5   Magnesium  --  2.0  --  1.8 1.8         Recent Labs  Lab 05/10/16  0457 05/08/16  0444   ALT 43 46   AST (SGOT) 71* 74*   Bilirubin, Total 3.2* 2.8*   Bilirubin, Direct 1.5* 1.4*   Albumin 2.9* 2.9*   Alkaline Phosphatase 121* 122*                   Microbiology Results     Procedure Component Value Units Date/Time    MRSA culture (If not done in Triage) [[id number]] Collected:  05/03/16 2314    Specimen:  Body Fluid from Nasal/Throat ASC Admission Updated:  05/04/16 2337    Narrative:       ORDER#: [id number]                                    ORDERED BY: WHITE, SUSAN  SOURCE: Nares and Throat                             COLLECTED:  05/03/16 23:14  ANTIBIOTICS AT COLL.:                                RECEIVED :  05/04/16 05:01  Culture MRSA Surveillance                  FINAL       05/04/16 23:36  05/04/16   Negative for Methicillin Resistant Staph aureus from Nares and             Negative for Methicillin Resistant Staph aureus from Throat             RADIOLOGY     Upon my review:    [name]  6:46 PM 05/11/2016

## 2016-05-11 NOTE — Plan of Care (Addendum)
Problem: Hemodynamic Status: Cardiac  Goal: Stable vital signs and fluid balance  Outcome: Progressing   05/11/16 1814   Goal/Interventions addressed this shift   Stable vital signs and fluid balance Monitor/assess vital signs and telemetry per unit protocol;Weigh on admission and record weight daily;Assess signs and symptoms associated with cardiac rhythm changes;Monitor intake/output per unit protocol and/or LIP order;Monitor lab values;Monitor for leg swelling/edema and report to LIP if abnormal       Problem: Inadequate Tissue Perfusion  Goal: Adequate tissue perfusion will be maintained  Outcome: Progressing   05/11/16 1814   Goal/Interventions addressed this shift   Adequate tissue perfusion will be maintained Monitor/assess vital signs;Monitor/assess lab values and report abnormal values;Monitor/assess neurovascular status (pulses, capillary refill, pain, paresthesia, paralysis, presence of edema);Monitor intake and output;Monitor for signs and symptoms of a pulmonary embolism (dyspnea, tachypnea, tachycardia, confusion);Reinforce use of ordered respiratory interventions (i.e. CPAP, BiPAP, Incentive Spirometer, Acapella, etc.);Position patient for maximum circulation/cardiac output       Problem: Heart Failure  Goal: Stable vital signs and fluid balance  Outcome: Progressing   05/11/16 1814   Goal/Interventions addressed this shift   Stable vital signs and fluid balance Monitor/assess vital signs and telemetry per unit protocol;Weigh on admission and record weight daily;Assess signs and symptoms associated with cardiac rhythm changes;Monitor intake/output per unit protocol and/or LIP order;Monitor lab values;Monitor for leg swelling/edema and report to LIP if abnormal       Comments: Gregory [name]'s learning abilities have been assessed. Today's individualized plan of care to continue hourly rounding, pain management, IV milrinone, and monitor BP and respiratory status was discussed with Gregory [name] and  he agreed to it. Gregory [name] demonstrates understanding of disease process, treatment plan, medications and consequences of noncompliance. All questions and concerns were addressed. Pt denies chest pain and SOB. Sinus tach, room air. IV Milrinone 6.82m/hr. Pt tolerated diet. Ambulated in room with standby assist. Pt bathed and groomed independently. Family visiting and updated. Right groin cath insertion site asesed, clean, dry and intact. Curos caps changed on picc line.     Fall precaution maintained. Re instructed to call for assistance, wait for help to arrive before getting out of bed. Pt educated about the importance of preventing falls and about proper safety.  Pt demonstrated understanding of education by repeating back.  Pt's informed of POC/goals. Verbalized understanding of disease process, treatment plan, medication, and consequences of noncompliance. Bed in lowest position. Bed wheels locked. Bed alarm on. Non-skid socks on bilaterally. Floor mats in used. Call light, phone, over bed table and personal belongings in reach. Participate in Hourly rounding. White board updated.

## 2016-05-11 NOTE — Progress Notes (Signed)
RENAL PROGRESS NOTE    Date Time: 05/11/16 12:52 PM  Patient Name: Gregory [name] T  Attending Physician: Dolphus Jenny, MD    Assessment:   Acute kidney injury secondary to decompensated congestive heart failure, on milrinone, creatinine is slowly improving.  Stage 3 to 4 chronic kidney disease secondary to chronic ischemic nephropathy related to severe cardiomyopathy.  Systolic and diastolic congestive heart failure, on diuretics and milrinone.  Good response so far.  Amyloid heart disease with severe cardiomyopathy, LVEF 25 percent.  Edema is improving.  Hypotension, blood pressure stable, related to cardiomyopathy.    Plan:   No changes to current dose of diuretics.  The potassium being stable, agree with starting a small dose of spironolactone once a day.  No changes to other medication  Renal panel tomorrow    Subjective:   Feels better  Denies any chest pain, shortness of breath at rest, cough, fever  No lightheadedness or headache  No palpitation  No nausea, vomiting or diarrhea  No fever  Urinating more, no symptoms  Appetite is better    Review of Systems:   All other 12 systems reviewed and found negative for new problems    Meds:     . aspirin EC  81 mg Oral Daily   . bumetanide  4 mg Oral BID   . colchicine  0.3 mg Oral Daily   . fluticasone  2 spray Each Nare Daily   . heparin (porcine)  5,000 U Subcutaneous Q12H [place]   . predniSONE  20 mg Oral Daily     Physical Exam:     BP 100/67   Pulse (!) 110   Temp 97.4 F (36.3 C) (Oral)   Resp 18   Ht 1.651 m (_0 )   Wt 54.5 kg (120 lb 1.6 oz)   SpO2 97%   BMI 19.99 kg/m     Intake              500 ml   Output             1050 ml   Net             -550 ml     Alert and talking, nondyspneic.  No new HEENT findings  JVP as before.  Lungs reveal crackles bilaterally, improved.  Heart tones are unchanged, systolic murmur as before  Abdomen is nontender, bowel sounds present  Peripheral edema has improved    Labs:        05/11/16   0357  05/10/16    0457   WBC  6.57  6.85   Hgb  12.7*  13.5   Hematocrit  38.0*  41.0*   Platelets  122*  127*     Sodium  138  136   Potassium  3.9  3.7   Chloride  93*  92*   CO2  34*  34*   BUN  89.0*  99.0*   Creatinine  2.3*  2.4*   Glucose  104*  100   Calcium  9.0  9.2   Magnesium   --   2.0         Signed by: Jerilynn Som, MD  [phone]

## 2016-05-11 NOTE — Plan of Care (Signed)
Problem: Safety  Goal: Patient will be free from injury during hospitalization  Outcome: Progressing   05/11/16 0346   Goal/Interventions addressed this shift   Patient will be free from injury during hospitalization  Assess patient's risk for falls and implement fall prevention plan of care per policy;Use appropriate transfer methods;Provide and maintain safe environment;Include patient/ family/ care giver in decisions related to safety;Hourly rounding;Ensure appropriate safety devices are available at the bedside     Goal: Patient will be free from infection during hospitalization  Outcome: Progressing   05/11/16 0346   Goal/Interventions addressed this shift   Free from Infection during hospitalization Assess and monitor for signs and symptoms of infection       Comments: [name] learning abilities have been assessed. Today's individualized plan of care to monitor vital signs, cardiac rhythm, pain management, hourly rounding was discussed with the patient and agrees to it. All questions and concerns were addressed

## 2016-05-12 LAB — CBC
Absolute NRBC: 0 10*3/uL
Hematocrit: 35.6 % — ABNORMAL LOW (ref 42.0–52.0)
Hgb: 11.9 g/dL — ABNORMAL LOW (ref 13.0–17.0)
MCH: 32.3 pg — ABNORMAL HIGH (ref 28.0–32.0)
MCHC: 33.4 g/dL (ref 32.0–36.0)
MCV: 96.7 fL (ref 80.0–100.0)
MPV: 12.1 fL (ref 9.4–12.3)
Nucleated RBC: 0 /100 WBC (ref 0.0–1.0)
Platelets: 131 10*3/uL — ABNORMAL LOW (ref 140–400)
RBC: 3.68 10*6/uL — ABNORMAL LOW (ref 4.70–6.00)
RDW: 14 % (ref 12–15)
WBC: 5.97 10*3/uL (ref 3.50–10.80)

## 2016-05-12 MED ORDER — SODIUM CHLORIDE 0.9 % IV BOLUS
500.0000 mL | Freq: Once | INTRAVENOUS | Status: DC
Start: 2016-05-12 — End: 2016-05-13

## 2016-05-12 NOTE — Progress Notes (Signed)
[facility]  PROGRESS NOTE      Patient: Gregory [name]  Date: 05/12/2016   LOS: 9 Days  Admission Date: 05/03/2016   MRN: [id number]  Attending: Dolphus Jenny  Please contact me on the following pager [phone]       ASSESSMENT/PLAN     Gregory [name] is a 70 y.o. 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  Patient with NYHA class IV.  ACC/AHA stage D, BNP 6908   bumex resumed. Aldactone resumed.   Fluid restriction 1.5 liter/day, low sodium  Bisoprolol and not started due to hypotension  Does not tolerate ACE inhibitor due to hypotension  Started milrinone drip low dose to watch for hypotension and response,to 0.375 mcg/kgc/min  PICC line placed  Still hypotensive in 80's-90's but asymptomatic  No ICD- Dr Gregory [name] spoke with Dr Gregory [name], unlikely to help and might be problematic due to amyloid( no capture)  lifevest  Plan to discharge on milrinone drip on Monday per Dr Gregory [name]    AKI (acute kidney injury)On chronic kidney disease stage 4(05/04/2016)   improved, creatinine stable after cardiac cath , continues to drop  not a good dialysis candidate due to hypotension and severe amyloid cardiomyopathy    Gout flare up to both hands  Much better with colchicine  Decrease prednisone to 10 mg daily    Elevated troponin (04/21/2014)    Assessment: Likely due to his renal failure and demand ischemia .  Peak troponin at 1.54     Cardiac Amyloidosis    Assessment: Patient with AL cardiac status post CyBorD therapy with relapse now on revlimid     holding dexamethasone while patient is on prednisone    Thrombocytopenia  Platelet count Better  Check HIT titer     Abnormal  liver function tests-suspect related to passive congestion of the liver due to right-sided heart failure   Follow up liver function tests improved       Nutrition: low sodium cardiac    DVT Prophylaxis:heparin       Code Status: full    DISPO: home monday    Family Contact: none       SUBJECTIVE     Gregory [name] states feels well. Walking in hall. Has energy. No sob chest pain abd pain nausea/vomiting.    MEDICATIONS     Current Facility-Administered Medications   Medication Dose Route Frequency   . aspirin EC  81 mg Oral Daily   . bumetanide  4 mg Oral BID   . colchicine  0.3 mg Oral Daily   . fluticasone  2 spray Each Nare Daily   . heparin (porcine)  5,000 Units Subcutaneous Q12H [facility]   . predniSONE  20 mg Oral Daily   . sodium chloride  500 mL Intravenous Once   . spironolactone  12.5 mg Oral Daily       PHYSICAL EXAM     Vitals:    05/12/16 1613   BP: (!) 82/59   Pulse: (!) 104   Resp: 18   Temp: (!) 96 F (35.6 C)   SpO2: 99%       Temperature: Temp  Min: 96 F (35.6 C)  Max: 98 F (36.7 C)  Pulse: Pulse  Min: 92  Max: 117  Respiratory: Resp  Min: 16  Max: 18  Non-Invasive BP: BP  Min: 77/51  Max: 102/77  Pulse Oximetry SpO2  Min: 93 %  Max: 99 %    Intake and Output Summary (Last 24 hours) at Date Time    Intake/Output Summary (Last 24 hours) at 05/12/16 1743  Last data filed at 05/12/16 1500   Gross per 24 hour   Intake              600 ml   Output             1175 ml   Net             -575 ml     GEN APPEARANCE: Normal;  A&OX3 high spirits  HEENT: PERLA; EOMI; Conjunctiva Clear  NECK: Supple; No bruits  CVS: RRR, S1, S2; No M/G/R  LUNGS: CTAB; No Wheezes; No Rhonchi: No rales  ABD: Soft; No TTP; + Normoactive BS  DKC:CQFJU bilateral edema; Pulses 2+ and intact. Hands with swelling bilateral hands.   Skin exam:  no pallor  NEURO: CN 2-12 intact; No Focal neurological deficits  CAP REFILL:  Normal  MENTAL STATUS:  Normal    Exam done by Dolphus Jenny, MD on 05/12/16 at 5:46 PM      LABS       Recent  Labs  Lab 05/12/16  0351 05/11/16  0357 05/10/16  0457   WBC 5.97 6.57 6.85   RBC 3.68* 3.90* 4.22*   Hgb 11.9* 12.7* 13.5   Hematocrit 35.6* 38.0* 41.0*   MCV 96.7 97.4 97.2   Platelets 131* 122* 127*         Recent Labs  Lab 05/11/16  0357 05/10/16  0457 05/09/16  0338 05/08/16  0444 05/07/16  0419   Sodium 138 136 135* 137 139   Potassium 3.9 3.7 3.9 4.2 3.2*   Chloride 93* 92* 94* 95* 92*   CO2 34* 34* 27 28 31*   BUN 89.0* 99.0* 112.0* 108.0* 117.0*   Creatinine 2.3* 2.4* 2.6* 3.0* 2.9*   Glucose 104* 100 114* 98 109*   Calcium 9.0 9.2 8.8 8.8 9.5   Magnesium  --  2.0  --  1.8 1.8         Recent Labs  Lab 05/10/16  0457 05/08/16  0444   ALT 43 46   AST (SGOT) 71* 74*   Bilirubin, Total 3.2* 2.8*   Bilirubin, Direct 1.5* 1.4*   Albumin 2.9* 2.9*   Alkaline Phosphatase 121* 122*                   Microbiology Results     Procedure Component Value Units Date/Time    MRSA culture (If not done in Triage) [[id number]] Collected:  05/03/16 2314    Specimen:  Body Fluid from Nasal/Throat ASC Admission Updated:  05/04/16 2337    Narrative:       ORDER#: [id number]                                    ORDERED BY: Gregory [name]  SOURCE: Nares and Throat  COLLECTED:  05/03/16 23:14  ANTIBIOTICS AT COLL.:                                RECEIVED :  05/04/16 05:01  Culture MRSA Surveillance                  FINAL       05/04/16 23:36  05/04/16   Negative for Methicillin Resistant Staph aureus from Nares and             Negative for Methicillin Resistant Staph aureus from Throat             RADIOLOGY     Upon my review:    Gregory [name]  5:43 PM 05/12/2016

## 2016-05-12 NOTE — Progress Notes (Signed)
Per Camera operator, pt will need a Armed forces training and education officer.  SW provided Camera operator with the Medical Order form for the physician to complete.    Ivenise A. Butts, MSW

## 2016-05-12 NOTE — Progress Notes (Signed)
RENAL PROGRESS NOTE    Date Time: 05/12/16 10:50 AM  Patient Name: Gregory [name]  Attending Physician: Dolphus Jenny, MD    Assessment:   Acute kidney injury secondary to severe cardiomyopathy and poor cardiac output, on milrinone.  Renal function has improved further.  Chronic kidney disease stage 3-4 secondary to chronic ischemic nephropathy.  Systolic and diastolic congestive heart failure, on Bumex and milrinone, diuresing well.  Amyloid heart disease with cardiomyopathy, LV ejection fraction 25 percent.  Edema, slow to improve  Hypotension, related to cardiomyopathy, blood pressure remained stable    Plan:   No change in diuretic dose  Milrinone per heart failure service.  No changes in other medication  Salt restricted diet to continue.  Recheck serum chemistry tomorrow    Subjective:   Feels continued improvement in breathing  Good urine output  Patient believes that leg swelling is improving   Tolerating milrinone infusion so far    Review of Systems:   No fever  No chest pain  No cough  No abdominal pain  No vomiting or diarrhea  No headache or lightheadedness  No acute joint pain  No urinary symptoms    Meds:     . aspirin EC  81 mg Oral Daily   . bumetanide  4 mg Oral BID   . colchicine  0.3 mg Oral Daily   . fluticasone  2 spray Each Nare Daily   . heparin (porcine)  5,000 U Subcutaneous Q12H [place]   . predniSONE  20 mg Oral Daily   . sodium chloride  500 mL Intravenous Once   . spironolactone  12.5 mg Oral Daily     Physical Exam:     BP 92/58   Pulse (!) 103   Temp 97.4 F (36.3 C) (Oral)   Resp 18   Ht 1.651 m (_0 )   Wt 56 kg (123 lb 8 oz)   SpO2 99%   BMI 20.55 kg/m     Intake              500 ml   Output              950 ml   Net             -450 ml     Alert, oriented, nondyspneic  Sitting on the side of bed comfortably  JVD+  Mucosa moist.  Lungs reveal crackles both sides, much improved.  Heart sounds are unchanged, systolic murmur as before.  Abdomen is nontender with normal  bowel sounds  Peripheral edema is a little better but still has significant swelling in both legs    Labs:     Recent Labs      05/12/16   0351  05/11/16   0357   WBC  5.97  6.57   Hgb  11.9*  12.7*   Hematocrit  35.6*  38.0*   Platelets  131*  122*   MCV  96.7  97.4     Recent Labs      05/11/16   0357  05/10/16   0457   Sodium  138  136   Potassium  3.9  3.7   Chloride  93*  92*   CO2  34*  34*   BUN  89.0*  99.0*   Creatinine  2.3*  2.4*   Glucose  104*  100   Calcium  9.0  9.2   Magnesium   --   2.0  Signed by: Jerilynn Som, MD  [phone]

## 2016-05-12 NOTE — Plan of Care (Signed)
Problem: Hemodynamic Status: Cardiac  Goal: Stable vital signs and fluid balance  Outcome: Progressing   05/12/16 1546   Goal/Interventions addressed this shift   Stable vital signs and fluid balance Monitor/assess vital signs and telemetry per unit protocol;Weigh on admission and record weight daily;Assess signs and symptoms associated with cardiac rhythm changes;Monitor intake/output per unit protocol and/or LIP order;Monitor lab values;Monitor for leg swelling/edema and report to LIP if abnormal       Problem: Impaired Mobility  Goal: Mobility/Activity is maintained at optimal level for patient  Outcome: Progressing   05/12/16 1546   Goal/Interventions addressed this shift   Mobility/activity is maintained at optimal level for patient Increase mobility as tolerated/progressive mobility;Encourage independent activity per ability;Maintain proper body alignment;Plan activities to conserve energy, plan rest periods;Reposition patient every 2 hours and as needed unless able to reposition self;Assess for changes in respiratory status, level of consciousness and/or development of fatigue       Problem: Heart Failure  Goal: Stable vital signs and fluid balance  Outcome: Progressing   05/12/16 1546   Goal/Interventions addressed this shift   Stable vital signs and fluid balance Monitor/assess vital signs and telemetry per unit protocol;Weigh on admission and record weight daily;Assess signs and symptoms associated with cardiac rhythm changes;Monitor intake/output per unit protocol and/or LIP order;Monitor lab values;Monitor for leg swelling/edema and report to LIP if abnormal     Goal: Mobility/Activity is maintained at optimal level for patient  Outcome: Progressing   05/12/16 1546   Goal/Interventions addressed this shift   Mobility/activity is maintained at optimal level for patient Increase mobility as tolerated/progressive mobility;Encourage independent activity per ability;Maintain proper body alignment;Plan  activities to conserve energy, plan rest periods;Reposition patient every 2 hours and as needed unless able to reposition self;Assess for changes in respiratory status, level of consciousness and/or development of fatigue       Comments: Gregory [name]'s learning abilities have been assessed. Today's individualized plan of care to continue hourly rounding, pain management, IV milrinone, ambulate, and monitor BP and respiratory status  was discussed with Gregory [name] and he agreed to it. Gregory [name] demonstrates understanding of disease process, treatment plan, medications and consequences of noncompliance. All questions and concerns were addressed. Pt denies chest pain and SOB. Room air, sinus tach. IV Milrinone 6.9m/hr. Pt's SBP was running running in 90's. Dr TJolyn Lentaware, per Dr. PAbbott Paocan tolerate 848's but wants to be informed. Pt tolerated diet. Ambulated on unit 2X with standby assist. Tolerated well, no distress.      Fall precaution maintained. Re instructed to call for assistance, wait for help to arrive before getting out of bed. Pt educated about the importance of preventing falls and about proper safety.  Pt demonstrated understanding of education by repeating back.  Pt's informed of POC/goals. Verbalized understanding of disease process, treatment plan, medication, and consequences of noncompliance. Bed in lowest position. Bed wheels locked. Bed alarm on. Non-skid socks on bilaterally. Floor mats in used. Call light, phone, over bed table and personal belongings in reach. Participate in Hourly rounding. White board updated.

## 2016-05-12 NOTE — Plan of Care (Signed)
Problem: Safety  Goal: Patient will be free from injury during hospitalization  Outcome: Progressing   05/12/16 0513   Goal/Interventions addressed this shift   Patient will be free from injury during hospitalization  Assess patient's risk for falls and implement fall prevention plan of care per policy;Provide and maintain safe environment;Use appropriate transfer methods;Ensure appropriate safety devices are available at the bedside;Include patient/ family/ care giver in decisions related to safety;Hourly rounding     Goal: Patient will be free from infection during hospitalization  Outcome: Progressing   05/12/16 0513   Goal/Interventions addressed this shift   Free from Infection during hospitalization Assess and monitor for signs and symptoms of infection       Comments: [name] learning abilities have been assessed. Today's individualized plan of care to monitor vital signs, cardiac rhythm, pain management, hourly rounding was discussed with the patient and agrees to it. All questions and concerns were addressed

## 2016-05-13 LAB — CBC
Absolute NRBC: 0 10*3/uL
Hematocrit: 37.8 % — ABNORMAL LOW (ref 42.0–52.0)
Hgb: 12.9 g/dL — ABNORMAL LOW (ref 13.0–17.0)
MCH: 33 pg — ABNORMAL HIGH (ref 28.0–32.0)
MCHC: 34.1 g/dL (ref 32.0–36.0)
MCV: 96.7 fL (ref 80.0–100.0)
MPV: 11.3 fL (ref 9.4–12.3)
Nucleated RBC: 0 /100 WBC (ref 0.0–1.0)
Platelets: 144 10*3/uL (ref 140–400)
RBC: 3.91 10*6/uL — ABNORMAL LOW (ref 4.70–6.00)
RDW: 14 % (ref 12–15)
WBC: 5.7 10*3/uL (ref 3.50–10.80)

## 2016-05-13 LAB — MAGNESIUM: Magnesium: 1.7 mg/dL (ref 1.6–2.6)

## 2016-05-13 LAB — BASIC METABOLIC PANEL
Anion Gap: 12 (ref 5.0–15.0)
BUN: 81 mg/dL — ABNORMAL HIGH (ref 9.0–28.0)
CO2: 31 mEq/L — ABNORMAL HIGH (ref 22–29)
Calcium: 9.1 mg/dL (ref 8.5–10.5)
Chloride: 91 mEq/L — ABNORMAL LOW (ref 100–111)
Creatinine: 2.5 mg/dL — ABNORMAL HIGH (ref 0.7–1.3)
Glucose: 117 mg/dL — ABNORMAL HIGH (ref 70–100)
Potassium: 3.8 mEq/L (ref 3.5–5.1)
Sodium: 134 mEq/L — ABNORMAL LOW (ref 136–145)

## 2016-05-13 LAB — HEMOLYSIS INDEX: Hemolysis Index: 15 (ref 0–18)

## 2016-05-13 LAB — GFR: EGFR: 31.2

## 2016-05-13 MED ORDER — PREDNISONE 1 MG PO TABS
10.0000 mg | ORAL_TABLET | Freq: Every morning | ORAL | 0 refills | Status: DC
Start: 2016-05-13 — End: 2016-05-13

## 2016-05-13 MED ORDER — SPIRONOLACTONE 25 MG PO TABS
12.5000 mg | ORAL_TABLET | Freq: Every day | ORAL | 0 refills | Status: DC
Start: 2016-05-13 — End: 2016-05-21

## 2016-05-13 MED ORDER — MILRINONE LACTATE IN DEXTROSE 20-5 MG/100ML-% IV SOLN
0.3750 ug/kg/min | INTRAVENOUS | Status: AC
Start: 2016-05-13 — End: ?

## 2016-05-13 MED ORDER — DEXAMETHASONE 4 MG PO TABS
20.0000 mg | ORAL_TABLET | ORAL | Status: DC
Start: 2016-05-13 — End: 2016-06-05

## 2016-05-13 MED ORDER — PREDNISONE 5 MG PO TABS
10.0000 mg | ORAL_TABLET | Freq: Every morning | ORAL | 0 refills | Status: DC
Start: 2016-05-13 — End: 2016-06-05

## 2016-05-13 MED ORDER — BUMETANIDE 2 MG PO TABS
4.0000 mg | ORAL_TABLET | Freq: Two times a day (BID) | ORAL | 0 refills | Status: DC
Start: 2016-05-13 — End: 2016-05-21

## 2016-05-13 NOTE — Discharge Summary (Signed)
[facility]      Patient: Gregory [name]  Admission Date: 05/03/2016   DOB: [birth year]  Discharge Date: 05/13/2016    MRN: [id number]  Discharge Attending:Batsheva Stevick E Erdine [name]     Referring Physician: Zoe Lan, MD  PCP: Zoe Lan, MD       DISCHARGE SUMMARY     Discharge Information   Admission Diagnosis:   AKI (acute kidney injury)    Discharge Diagnosis:   Active Hospital Problems    Diagnosis   . Cardiac amyloidosis   . AKI (acute kidney injury)   . Hyperkalemia   . Light chain (AL) amyloidosis   . Elevated troponin   . Acute on chronic systolic and diastolic heart failure, NYHA class 4    gout flare    Admission Condition: serious  Discharge Condition: guarded  Consultants: cardiology, nephrology  Functional Status: baseline  Discharged to: home with services    Discharge Medications:     Medication List      START taking these medications    dexamethasone 4 MG tablet  Commonly known as:  DECADRON  Take 5 tablets (20 mg total) by mouth once a week.     milrinone 20-5 MG/100ML-% infusion  Commonly known as:  PRIMACOR  Infuse 23.0625 mcg/min into the vein continuous.     spironolactone 25 MG tablet  Commonly known as:  ALDACTONE  Take 0.5 tablets (12.5 mg total) by mouth daily.        CHANGE how you take these medications    bumetanide 2 MG tablet  Commonly known as:  BUMEX  Take 2 tablets (4 mg total) by mouth 2 (two) times daily.  What changed:  how much to take     Lenalidomide 5 MG Caps  Commonly known as:  REVLIMID  Take 5 mg by mouth every other day.Days 1-21. Hold for 7 days. For Amyloidosis  What changed:   how much to take   when to take this   additional instructions     predniSONE 1 MG tablet  Commonly known as:  DELTASONE  Take 10 tablets (10 mg total) by mouth every morning with breakfast.  What changed:   medication strength   how much to take   when to take this        CONTINUE taking these medications    aspirin EC 81 MG EC tablet      HYDROcodone-acetaminophen 5-325 MG per tablet  Commonly known as:  NORCO  Take 1 tablet by mouth every 6 (six) hours as needed for Pain.for up to 10 doses     metOLazone 5 MG tablet  Commonly known as:  ZAROXOLYN     prochlorperazine 10 MG tablet  Commonly known as:  COMPAZINE        STOP taking these medications    cephalexin 500 MG capsule  Commonly known as:  KEFLEX     clindamycin 300 MG capsule  Commonly known as:  CLEOCIN           Where to Get Your Medications      You can get these medications from any pharmacy    Bring a paper prescription for each of these medications   bumetanide 2 MG tablet   predniSONE 1 MG tablet   spironolactone 25 MG tablet     Information about where to get these medications is not yet available    Ask your nurse or doctor about these medications   dexamethasone 4 MG  tablet   milrinone 20-5 MG/100ML-% infusion             Hospital Course   Presentation History   [name] is a 70 y.o. male with PMH of amyloidosis complicated by CHF (23-34%), CKD Stage 4, asthma, HTN who presents with hyperkalemia. He saw his new PCP 05/02/16 and was placed on keflex with prednisone.  Today he was scheduled to see his oncologist. He had routine blood work done and then was called to come in due to hyperkalemia.  Patient has creatinine of 5 and BUN over 100    He was recently discharged on 04/18/16 from hospital after being treated for acute decompensated heart failure.  Patient has history of amyloidosis, CHF with EF 25-30%, and CKD stage 4, HTN, asthma.  Prior to this he was at [facility] 10/13-17/17 for the same. His BNP in most recent hospitalization went from 17, 000 to 4, 000 with bumex and zaroxalyn.       See HPI for details.    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  Right  ventricular pressure 24/8.   Pulmonary capillary wedge pressure mean 15, pulmonary artery pressure  29/12, mean 16.  Cardiac output by thermodilution 1.6 liters per minute.   Cardiac output by assumed Fick 1.93 liters per minute, with mixed venous  oxygen 46%.  CONCLUSIONS:  The patient presents for diagnostic right heart catheterization.  Today's  study shows essentially normal right heart pressures, however, cardiac  output was extremely low on multiple determinations.  This data will be  shared with the heart failure team and will be used to guide further  therapy.    Status post echocardiogram showing left ventricle ejection fraction of 20-25 percent with severe diffuse hypokinesis.  Patient with NYHA class IV.  ACC/AHA stage D, BNP [id number].   Bumex resumed at lower dose. Aldactone resumed.   May use zaroxolyn as needed three times weekly (as per home regimen) for fluid gain.  Bisoprolol not started due to hypotension  Does not tolerate ACE inhibitor due to hypotension  Milrinone drip 0.375 mcg/kgc/min  Still hypotensive in 80's-90's but asymptomatic  Discussed with Dr May: No indication for lifevest or AICD as life expectancy is very low <12 months.  Fluid restriction 1.5 liter/day, low sodium 2g diet.  Visiting nurses. Follow up with Dr May to be arranged by Dr Gregory [name] office.     AKI (acute kidney injury)On chronic kidney disease stage 4(05/04/2016)  Improved, creatinine stable after cardiac cath , at 2.3-2.5 from peak of 3.   Not a good dialysis candidate due to hypotension and severe amyloid cardiomyopathy     Gout flare up to both hands  Much better with colchicine and prednisone. Resolved. Will complete prednisone taper. Home dexamethasone resumed.     Elevated troponin (04/21/2014)  Likely due to his renal failure and demand ischemia .  Peak troponin at 1.54     Cardiac Amyloidosis  Patient with AL cardiac status post CyBorD therapy with relapse now on revlimid   Dexamethasone resumed.      Thrombocytopenia  Platelet count stabilized.  HIT antibody negative.    Abnormal liver function tests-suspect related to passive congestion of the liver due to right-sided heart failure   Follow up liver function tests improved  Procedures/Imaging:   Upon my review: Xr Chest Ap Portable    Result Date: 05/08/2016   Interval placement of right PICC line with the tip overlying SVC. No visible right pneumothorax. Golden Hurter, MD 05/08/2016 2:55 PM          Best Practices   Was the patient admitted with either a CHF Exacerbation or Pneumonia? yes     Progress Note/Physical Exam at Discharge     Subjective: Feels great. May have more lower extremity swelling. Gout resolved. No fever, cough, sob or chest pain. Good energey.    Vitals:    05/12/16 2322 05/13/16 0436 05/13/16 0706 05/13/16 0842   BP: _0    Pulse: (!) 104 (!) 104 (!) 101    Resp: _1 Temp: 97.3 F (36.3 C) (!) 96.6 F (35.9 C) 97.4 F (36.3 C)    TempSrc: Oral Oral Oral    SpO2: 96% 100% 94%    Weight:  60.6 kg (133 lb 9.6 oz)  59.9 kg (132 lb 1.6 oz)   Height:           General: NAD, AAOx3  HEENT: perrla, eomi, sclera anicteric, OP: Clear, MMM  Neck: supple, FROM, no LAD  Cardiovascular: RRR, no m/r/g  Lungs: CTAB, no w/r/r  Abdomen: soft, +BS, NT/ND, no masses, no g/r  Extremities: 2+ lower extremity edema  Skin: no rashes or lesions noted  Neuro: CN 2-12 intact; No Focal neurological deficits       Diagnostics     Labs/Studies Pending at Discharge: no    Last Labs     Recent Labs  Lab 05/13/16  0453 05/12/16  0351 05/11/16  0357   WBC 5.70 5.97 6.57   RBC 3.91* 3.68* 3.90*   Hgb 12.9* 11.9* 12.7*   Hematocrit 37.8* 35.6* 38.0*   MCV 96.7 96.7 97.4   Platelets 144 131* 122*         Recent Labs  Lab 05/13/16  0453 05/11/16  0357 05/10/16  0457 05/09/16  0338 05/08/16  0444 05/07/16  0419   Sodium 134* 138 136 135* 137 139   Potassium 3.8 3.9 3.7 3.9 4.2 3.2*   Chloride 91* 93* 92* 94* 95* 92*   CO2 31* 34* 34* 27 28 31*    BUN 81.0* 89.0* 99.0* 112.0* 108.0* 117.0*   Creatinine 2.5* 2.3* 2.4* 2.6* 3.0* 2.9*   Glucose 117* 104* 100 114* 98 109*   Calcium 9.1 9.0 9.2 8.8 8.8 9.5   Magnesium 1.7  --  2.0  --  1.8 1.8       Microbiology Results     Procedure Component Value Units Date/Time    MRSA culture (If not done in Triage) [[id number]] Collected:  05/03/16 2314    Specimen:  Body Fluid from Nasal/Throat ASC Admission Updated:  05/04/16 2337    Narrative:       ORDER#: [id number]                                    ORDERED BY: WHITE, SUSAN  SOURCE: Nares and Throat                             COLLECTED:  05/03/16 23:14  ANTIBIOTICS AT COLL.:  RECEIVED :  05/04/16 05:01  Culture MRSA Surveillance                  FINAL       05/04/16 23:36  05/04/16   Negative for Methicillin Resistant Staph aureus from Nares and             Negative for Methicillin Resistant Staph aureus from Throat             Patient Instructions   Discharge Diet: 2g Na 1.5L fluid restriction  Discharge Activity: as tolerated    Follow Up Appointment:  Follow-up Information     [facility]    Contact information:  [facility]  [phone]           [name], [name], MD .    Specialty:  Internal Medicine  Why:  Follow up with your primary care doctor within 1 week  Contact information:  [place] [address]  [phone]             May, Jacklynn Bue, MD .    Specialties:  Advanced Heart Failure and Transplant Cardiology, Internal Medicine, Cardiology  Why:  Dr Gregory [name] office will call you  Contact information:  [place]  [place] [address]  [phone]                    Time spent examining patient, discussing with patient/family regarding hospital course, chart review, reconciling medications and discharge planning: 50 minutes.    Lavone Nian Mirabella Hilario    8:45 AM 05/13/2016

## 2016-05-13 NOTE — Plan of Care (Signed)
Care plan goals completed, patient adequate for discharge.

## 2016-05-13 NOTE — Progress Notes (Signed)
Advanced Heart Failure and Transplant Progress Note  [facility] Failure/Transplant Spectra Link [id number]  I'm on [place] Text, too!    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   Initial response to CyBorD therapy; however, patient has relapsed and recently started revlimid 5 mg PO QOD and dexamethasone 20 mg weekly   Acute on chronic systolic and diastolic heart failure   Low cardiac output   Restrictive cardiac physiology   Acute on chronic renal failure, stage V   Hyperkalemia (treated)   Hyponatremia    Mild thrombocytopenia   Acute gout flare   Overall poor prognosis    Recommendations:     Continue milrinone 0.375 mcg/kg/min   Continue bumetanide 4 mg PO daily at 0800 and 1400   Fluid restrict to 1.5 L/day and Na 2 gm/day   Daily standing weights   No beta blocker due to relative hypotension   No ACE/ARB/ARNI due to severity of renal failure and relative hypotension   Continue spironolactone 12.5 mg QD   Continue Revlimid; holding dexamethasone while patient is on prednisone   Prednisone taper for treatment of gout; colchicine started   Activity as tolerated   Face to face for milrinone completed 05/10/16   Elastic stockings to the legs during the day to help with his edema   Discharge home today   Follow up with me in the Advanced Heart Failure clinic on 05/21/16 at 11:00 AM    ---------------------------------------------------------------------------------------------------------  Subjective:  Patient reports doing well: he walked in the hallways on the past two days without dyspnea, lightheadedness or fatigue. He feels much better since starting milrinone. His appetite is stable and he denies abdominal symptoms. He continues to have some leg edema. Reviewed with Dr Romana Juniper, Dr Emelda Brothers, and bedside RN.    Objective:    Vital signs in last 24 hours:   Temp:  [96 F (35.6 C)-98.1  F (36.7 C)] 97.4 F (36.3 C)  Heart Rate:  [101-106] 101  Resp Rate:  [16-20] 20  BP: (82-110)/(55-73) 92/59  SpO2: 94 %O2 Device: None (Room air)  Height: 165.1 cm (_0 )  Weight: 60.6 kg (133 lb 9.6 oz); Weight change: 4.581 kg (10 lb 1.6 oz)  Body mass index is 22.23 kg/m.[name]    Telemetry: sinus at 100 BPM, no VT    I/O last 3 completed shifts:  In: 600 [P.O.:600]  Out: 2075 [Urine:2075]                       Drips:  . milrinone 0.375 mcg/kg/min (05/12/16 1923)     Physical Exam:   General:  alert, appears stated age, cooperative and no distress  HEENT:  Conjunctiva pale, bucal mucosa moist.  JVP 10 cm with large v waves, no adenopathy, no carotid bruit and supple, symmetrical, trachea midline.   Lungs:  clear to auscultation bilaterally  Cardiovascular:  regular rate and rhythm, S1, S2 normal  Abdominal:  soft, non-tender; bowel sounds normal; no masses,  no organomegaly.  Hepatojular reflux is present.  Extremities:  edema 2+ pitting edema of the legs bilaterally.  Extremities are warm to the touch.    Neuromuscular exam:  Grossly non-focal though not formally tested.  Normal muscle tone.  Patient able to perform PT and OT.    Cardiac Studies:  Echocardiogram on 05/04/16 shows a left ventricular end-diastolic dimension of  33 mm, concentric LVH (walls 15 mm), global HK with a left ventricular ejection fraction of 20-25%, and grade 3 diastolic dysfunction.  The right ventricle is dilated with reduced systolic function.  The left atrium is dilated and the right atrium is dilated.  The aortic valve is trileaflet without evidence of stenosis; there is trivial insufficiency.  The mitral valve is morphologically normal with mild regurgitation.  There is mild tricuspid regurgitation.  Pulmonary systolic pressure is estimated to be 30 mmHg above right atrial pressure (10-15 mmHg).  There is trivial pulmonic insufficiency.  A pericardial effusion is not present.    ECG Findings on 05/03/16:  sinus rhythm, left  anterior fascicular block and inferior MI, low voltage.  As compared to his previous ECG on 04/17/16 there is no no significant change.      Right heart catheterization on 05/07/16 showed a RA pressure of 8 mmHg, RV pressure of 24/8 mmHg, PA pressure of 29/9/16 mmHg, and a PW pressure of 13 mmHg.  PA saturation was 45.8%.  Cardiac output by Fick calculation was 1.9 L/min with a corresponding index of 1.2 L/min/m2.   Cardiac output by thermodilution was 1.6 L/min with a corresponding index of 0.95 L/min/m2.  PVR is 1.6 WU.    Imaging:  Chest X-Ray: 05/03/16 (images reviewed) no acute process (no pulmonary edema, pleural effusions or air space disease.      aspirin EC 81 mg Oral Daily   bumetanide 4 mg Oral BID   colchicine 0.3 mg Oral Daily   fluticasone 2 spray Each Nare Daily   heparin (porcine) 5,000 Units Subcutaneous Q12H [facility]   predniSONE 20 mg Oral Daily   sodium chloride 500 mL Intravenous Once   spironolactone 12.5 mg Oral Daily     Lab Results   Component Value Date    WBC 5.70 05/13/2016    HGB 12.9 (L) 05/13/2016    PLT 144 05/13/2016    NA 134 (L) 05/13/2016    K 3.8 05/13/2016    BUN 81.0 (H) 05/13/2016    CREAT 2.5 (H) 05/13/2016    EGFR 31.2 05/13/2016    MG 1.7 05/13/2016    AST 71 (H) 05/10/2016    ALB 2.9 (L) 05/10/2016    INR 1.3 (H) 04/17/2016    BNP 6,908 (H) 05/03/2016

## 2016-05-13 NOTE — Progress Notes (Signed)
Chaplain Service      Background:  Visit Type: Follow-up was made by Chaplain with patient, Gregory [name], based on Source: Chaplain Initiated.  Present at Visit: patient.  Spiritual Care Provided to: patient only.  Length of Visit: 0-15 minutes .    Summary:  Reason for Request: Other (PC)   Spiritual Care Interventions: Provided silent and supportive presence   Spiritual Care Outcomes: Declined visit    Notes:    Patient was visited by [place]. Patient declined visit.

## 2016-05-13 NOTE — Plan of Care (Signed)
Problem: Hemodynamic Status: Cardiac  Goal: Stable vital signs and fluid balance   05/13/16 0027   Goal/Interventions addressed this shift   Stable vital signs and fluid balance Monitor/assess vital signs and telemetry per unit protocol;Weigh on admission and record weight daily;Assess signs and symptoms associated with cardiac rhythm changes;Monitor lab values;Monitor intake/output per unit protocol and/or LIP order       Problem: Heart Failure  Goal: Stable vital signs and fluid balance   05/13/16 0027   Goal/Interventions addressed this shift   Stable vital signs and fluid balance Monitor/assess vital signs and telemetry per unit protocol;Weigh on admission and record weight daily;Assess signs and symptoms associated with cardiac rhythm changes;Monitor lab values;Monitor intake/output per unit protocol and/or LIP order       Comments: The patient and care giver's learning abilities have been assessed. Today's individualized plan of care to continue on mirlrinone drip, daily weight, possible d/c in am was discussed with the patient and care giver and agree to it. Patient and care giver demonstrates understanding of disease process, treatment plan, medications and consequences of noncompliance. All questions and concerns were addressed.

## 2016-05-13 NOTE — Progress Notes (Signed)
RENAL PROGRESS NOTE    Date Time: 05/13/16 4:08 PM  Patient Name: Gregory [name]  Attending Physician: Dolphus Jenny, MD    Assessment:   Acute kidney injury, Cr trending down,  Chronic kidney disease III/IV,   Blood pressure is on lower side.  Systolic and diastolic heart failure.  Bilateral lower extremity edema.  Amyloidosis.  Plan:   Monitor the renal function, electrolytes and fluid status.  Monitor the urine output and fluid status  Discussed regarding diet and fluid intake with the pt.  Diuretic and milrinone per cardiology.   Discussed with cardiology.  Pt to follow up in clinic upon discharge.  Subjective:     Pt is seen and examined at bedside.  Alert and awake and in no distress.    Review of Systems:     Did not offer any complaints.  No nausea, vomiting, cherst pain, palpitations, dyspnea, cough, headache.    Meds:     Current Facility-Administered Medications   Medication Dose Route Frequency   . aspirin EC  81 mg Oral Daily   . bumetanide  4 mg Oral BID   . colchicine  0.3 mg Oral Daily   . fluticasone  2 spray Each Nare Daily   . heparin (porcine)  5,000 Units Subcutaneous Q12H [facility]   . predniSONE  20 mg Oral Daily   . sodium chloride  500 mL Intravenous Once   . spironolactone  12.5 mg Oral Daily       Physical Exam:   BP 93/60   Pulse 100   Temp 97.5 F (36.4 C) (Oral)   Resp 20   Ht 1.651 m (_0 )   Wt 59.9 kg (132 lb 1.6 oz)   SpO2 95%   BMI 21.98 kg/m     Intake/Output Summary (Last 24 hours) at 05/13/16 1608  Last data filed at 05/13/16 1537   Gross per 24 hour   Intake              340 ml   Output             1175 ml   Net             -835 ml       General appearance - alert, oriented, well appearing, and in no distress  HEENT no pallor   Mucous membranes moist, skin turgor normal  Neck - JVP not raised  Chest - Bilateral air entry  Heart -S1, S2,   Abdomen - soft, nontender, nondistended,  Has significant   leg edema,     Labs:     Recent Labs      05/13/16   0453  05/12/16    0351   WBC  5.70  5.97   Hgb  12.9*  11.9*   Hematocrit  37.8*  35.6*   Platelets  144  131*   MCV  96.7  96.7     Recent Labs      05/13/16   0453  05/11/16   0357   Sodium  134*  138   Potassium  3.8  3.9   Chloride  91*  93*   CO2  31*  34*   BUN  81.0*  89.0*   Creatinine  2.5*  2.3*   Glucose  117*  104*   Calcium  9.1  9.0   Magnesium  1.7   --      No results for input(s): AST, ALT, ALKPHOS, PROT, ALB in  the last 72 hours.  No results for input(s): PTT, PT, INR in the last 72 hours.                  Radiology Results (24 Hour)     ** No results found for the last 24 hours. Remus Blake, MD  05/13/2016  4:08 PM  [phone]

## 2016-05-13 NOTE — Plan of Care (Signed)
Patients learning abilities have been assessed. Pt is A&Ox4  Denies CP, SOB, dizziness, nausea, vomiting, and diarrhea. Today's plan of care includes monitoring of cardiac HR and Rhythm maintained by continuous infusion of Primacor, strict I/O with fluid restriction of 1569m/day, and [place] instructions.  Safety precautions and interventions have been discussed with the patient and he agrees to them. Pt demonstrates understanding of the disease process, treatment plans, medications, and consequences of noncompliance. All questions and concerns have been addressed with the patient and family.     Pt D/C after representative from infusion pump company comes to educate pt and family on device. Expected around 1700 tonight. Pharmacy discussed and educated the pt and family at length today on medication regime. Bedside RN will reinforce all education at time of [place].

## 2016-05-14 NOTE — Progress Notes (Signed)
Transitional Care Management    Enrollment--    Name/Number of person who participated in call:  Patient's significant other, [phone] (home) [phone] (work)    [employer] explained to patient and TCM contact information provided: Yes    HIPAA verification completed and notified that call is recorded:  Yes     Primary language spoken: English     Interpreter needed:  No        Health History--    Health Status (PMH, current admission summary, previous admission history):  Past Medical History:   Diagnosis Date   . Arthritis     bilat shoulders, lt knee   . Asthma without status asthmaticus     childhood   . Cardiac amyloidosis    . CHF (congestive heart failure)    . Fracture of unspecified bones     1974 lt shoulder   . Gout    . Malignant neoplasm      Patient Active Problem List   Diagnosis   . Ganglion cyst of wrist, right   . Primary localized osteoarthrosis, shoulder region   . Asthma without status asthmaticus   . Acute on chronic systolic and diastolic heart failure, NYHA class 4   . Elevated troponin   . Bilateral lower extremity edema   . Light chain (AL) amyloidosis   . Hyperkalemia   . AKI (acute kidney injury)   . Cardiac amyloidosis       Patient admitted to [facility] on     All eligible TCM Diagnoses (include A1C, EF, weight):         CHF  132lbs  5'5"    Does the patient understand why he/she was admitted to the hospital:  Yes     Does the patient understand what the discharge diagnosis was: Yes     What was the patient's chief complaint upon hospital arrival: CHF       Medication Reconciliation--    Medication List:  Current Outpatient Prescriptions   Medication Sig Dispense Refill   . aspirin EC 81 MG EC tablet Take 81 mg by mouth daily.     . bumetanide (BUMEX) 2 MG tablet Take 2 tablets (4 mg total) by mouth 2 (two) times daily. 60 tablet 0   . dexamethasone (DECADRON) 4 MG tablet Take 5 tablets (20 mg total) by mouth once a week.     [name] HYDROcodone-acetaminophen (NORCO) 5-325 MG per tablet  Take 1 tablet by mouth every 6 (six) hours as needed for Pain.for up to 10 doses 10 tablet 0   . Lenalidomide (REVLIMID) 5 MG Cap Take 5 mg by mouth every other day.Days 1-21. Hold for 7 days. For Amyloidosis (Patient taking differently: Take 2.5 mg by mouth daily.Days 1-21 scheduled to start on 05/07/16. Hold for 7 days. For Amyloidosis    )     . metOLazone (ZAROXOLYN) 5 MG tablet Take 5 mg by mouth as needed (PRN Sun, Tues Thus if weight >135).     . milrinone (PRIMACOR) 20-5 MG/100ML-% infusion Infuse 23.0625 mcg/min into the vein continuous.     . predniSONE (DELTASONE) 5 MG tablet Take 2 tablets (10 mg total) by mouth every morning with breakfast. 9 tablet 0   . prochlorperazine (COMPAZINE) 10 MG tablet Take 10 mg by mouth every 6 (six) hours as needed (Nausea).     [name] spironolactone (ALDACTONE) 25 MG tablet Take 0.5 tablets (12.5 mg total) by mouth daily. 30 tablet 0     No current  facility-administered medications for this visit.        Were the medications reviewed with the patient:  Yes, medication reconciliation completed      Was the patient able to obtain all of his/her medications when they left the hospital:  Yes     Does the patient understand what all of his/her medications are for: Yes    Physician Follow Up--    Was the patient scheduled/seen by PCP or cardiologist within critical timeframe of hospital discharge: No, patient is scheduled to see Dr. May on 05/21/16, CM recommended seeing PCP within 3-5 days and calling CM [name] if unable to secure 3-5 day appointment, contact information given.     Does patient understand importance of seeing a healthcare professional within critical timeframe of discharge: Yes    Name/number of PCP: [name], [name], MD, [phone]    Letter sent to PCP notifying of TCM involvement: Yes    Home Health Co-Management--    Is patient being followed by Home Health: Yes, [facility]    CHF Management--    What is patient's understanding of CHF:  Patient's spouse states that they are aware of red flag symptoms and have the magnet of the zones on the refrigerator, and she will be making sure patient weighs daily as patient is non-compliant at times, did not weigh today     Does the patient have a scale: Yes    Does the patient weigh himself/herself daily: Yes    Does the patient understand the importance of when (in AM) s/he should be weighed and why (consistency and reporting): Yes    Has the patient's MD told him/her what his/her "dry weight" should be: Yes    What was weight since hospital discharge: unknown     Is the patient keeping a weight log: No, instructed to start       Does the patient understand why "dry weight" is important: Yes    Was the patient educated on the CHF red flags/symptoms: Yes   Weight gain of 2 pounds in a day or 5 pounds in a week  Increased swelling of your hands, abdomen, legs, or ankles   Increase in shortness of breath with activity  Increase in the number of pillows needed to sleep at night  New or more frequent chest pain or tightness  New onset of dizziness or new onset of dizziness or lightheadedness after standing up    Is the patient currently experiencing any of the CHF red flags/symptoms: Yes     Was the patient instructed on what to do if he/she is experiencing any of the CHF red flags/symptoms: Yes     Is patient familiar with the CHF zones: Yes    Education on zones provided: No      Was [facility] TCM visit initiated: No, HH in place       Does patient meet EF requirements for cardiac rehab?  Yes, 20-25%    If yes, patient notified of cardiac rehab services and that a cardiac rehab team member will be calling patient with more information:  Yes    Date patient was referred to cardiac rehab: 04/19/16 by [name]    Educational materials mailed to patient:  Already received       Call Summary Notes-- CM contacted patient in order to enroll in TCM and initiate CHF Management. CM will follow up.     Allean Found,  MSW  Case Manager  [facility]  Transitional Care Management  T [phone] F [phone]

## 2016-05-21 ENCOUNTER — Emergency Department: Payer: Medicare Other

## 2016-05-21 ENCOUNTER — Emergency Department
Admission: EM | Admit: 2016-05-21 | Discharge: 2016-05-21 | Disposition: A | Discharge status: Home / Self Care | Payer: Medicare Other | Attending: Emergency Medicine | Admitting: Emergency Medicine

## 2016-05-21 ENCOUNTER — Other Ambulatory Visit: Payer: Medicare Other

## 2016-05-21 ENCOUNTER — Encounter: Payer: Self-pay | Admitting: Registered Nurse

## 2016-05-21 ENCOUNTER — Ambulatory Visit
Payer: Medicare Other | Attending: Advanced Heart Failure and Transplant Cardiology | Admitting: Advanced Heart Failure and Transplant Cardiology

## 2016-05-21 VITALS — BP 98/68 | HR 109 | Temp 97.3°F | Wt 141.8 lb

## 2016-05-21 DIAGNOSIS — Z7982 Long term (current) use of aspirin: Secondary | ICD-10-CM | POA: Insufficient documentation

## 2016-05-21 DIAGNOSIS — I509 Heart failure, unspecified: Secondary | ICD-10-CM | POA: Insufficient documentation

## 2016-05-21 DIAGNOSIS — T82898A Other specified complication of vascular prosthetic devices, implants and grafts, initial encounter: Secondary | ICD-10-CM | POA: Insufficient documentation

## 2016-05-21 DIAGNOSIS — Z95828 Presence of other vascular implants and grafts: Secondary | ICD-10-CM

## 2016-05-21 DIAGNOSIS — I5022 Chronic systolic (congestive) heart failure: Secondary | ICD-10-CM

## 2016-05-21 DIAGNOSIS — I428 Other cardiomyopathies: Secondary | ICD-10-CM

## 2016-05-21 DIAGNOSIS — I5032 Chronic diastolic (congestive) heart failure: Secondary | ICD-10-CM

## 2016-05-21 MED ORDER — BUMETANIDE 2 MG PO TABS
4.0000 mg | ORAL_TABLET | Freq: Two times a day (BID) | ORAL | 3 refills | Status: DC
Start: 2016-05-21 — End: 2016-06-14

## 2016-05-21 MED ORDER — SPIRONOLACTONE 25 MG PO TABS
25.0000 mg | ORAL_TABLET | Freq: Every day | ORAL | 3 refills | Status: DC
Start: 2016-05-21 — End: 2016-06-13

## 2016-05-21 NOTE — ED Provider Notes (Signed)
Triage note: Initial testing was ordered based on presenting complaint in order to expedite care. I am not the primary provider for this patient.  Milrinone dependent via PICC line, stopped working PTA. I d/w PICC line nurse _0 :42. They will come evaluate.       Guy Sandifer, MD  05/21/16 [phone]

## 2016-05-21 NOTE — Discharge Instructions (Signed)
Dear Gregory [name]:    Thank you for choosing the [facility], the premier emergency department in the [place] area.  I hope your visit today was EXCELLENT.    Specific instructions for your visit today:    PICC Line Repair     You have been seen today for a PICC Line Repair.    A peripherally inserted central catheter (PICC Line) is a long catheter. It is put into a vein in the arm and goes to the larger blood vessels near the heart. These lines can be used to provide medications, nutrition, or fluids on a long-term basis. You were seen today because your PICC line was not working normally and had to be fixed.    Based on your evaluation today, your doctor doesn't think your PICC line malfunctioned because of any serious or life-threatening problems. Therefore, you won't need any more tests or medication.    You had your PICC line fixed today. It is working well. As with any medical device, it is important to care for the PICC line as you were instructed before (flushing the catheter, changing dressings, etc.).    YOU SHOULD SEEK MEDICAL ATTENTION IMMEDIATELY, EITHER HERE OR AT THE NEAREST EMERGENCY DEPARTMENT, IF ANY OF THE FOLLOWING OCCUR:     You get a fever (temperature higher than 100.19F or 38C).   You start to have skin redness, drainage, or swelling near the entry point of your PICC line.   You get swelling in the hand, arm, or neck on the side of your PICC line.   You get pain, numbness, or tingling in the arm on the side of your PICC line.   You develop chest pain or trouble breathing.   Your PICC line starts to malfunction or comes out.   You get light headed or dizzy.   You have any other symptoms, concerns, or you are not getting better as expected.   You get worse or feel you can't wait until your follow-up appointment to get treated.    If you can't follow up with your doctor, or if at any time you feel you need to be rechecked or seen again, come back here or  go to the nearest emergency department.                 If you do not continue to improve or your condition worsens, please contact your doctor or return immediately to the Emergency Department.    Sincerely,  [place], [place]  Attending Emergency Physician  [facility] Emergency Department    ONSITE PHARMACY  Our full service onsite pharmacy is located in the ER waiting room.  Open 7 days a week from 9 am to 11 pm.  We accept all major insurances and prices are competitive with major retailers.  Ask your provider to print your prescriptions down to the pharmacy to speed you on your way home.    OBTAINING A PRIMARY CARE APPOINTMENT    Primary care physicians (PCPs, also known as primary care doctors) are either internists or family medicine doctors. Both types of PCPs focus on health promotion, disease prevention, patient education and counseling, and treatment of acute and chronic medical conditions.    Call for an appointment with a primary care doctor.  Ask to see who is taking new patients.     [place]  telephone:  [phone]  [email or web address]    DOCTOR REFERRALS  Call [phone] (available 24  hours a day, 7 days a week) if you need any further referrals and we can help you find a primary care doctor or specialist.  Also, available online at:  [email or web address]    YOUR CONTACT INFORMATION  Before leaving please check with registration to make sure we have an up-to-date contact number.  You can call registration at [phone] to update your information.  For questions about your hospital bill, please call [phone].  For questions about your Emergency Dept Physician bill please call [phone].      [place]  If you need help with health or social services, please call 2-1-1 for a free referral to resources in your area.  2-1-1 is a free service connecting people with information on health insurance, free clinics, pregnancy,  mental health, dental care, food assistance, housing, and substance abuse counseling.  Also, available online at:  http://www.[place].org    MEDICAL RECORDS AND TESTS  Certain laboratory test results do not come back the same day, for example urine cultures.   We will contact you if other important findings are noted.  Radiology films are often reviewed again to ensure accuracy.  If there is any discrepancy, we will notify you.      Please call [phone] to pick up a complimentary CD of any radiology studies performed.  If you or your doctor would like to request a copy of your medical records, please call [phone].      ORTHOPEDIC INJURY   Please know that significant injuries can exist even when an initial x-ray is read as normal or negative.  This can occur because some fractures (broken bones) are not initially visible on x-rays.  For this reason, close outpatient follow-up with your primary care doctor or bone specialist (orthopedist) is required.    MEDICATIONS AND FOLLOWUP  Please be aware that some prescription medications can cause drowsiness.  Use caution when driving or operating machinery.    The examination and treatment you have received in our Emergency Department is provided on an emergency basis, and is not intended to be a substitute for your primary care physician.  It is important that your doctor checks you again and that you report any new or remaining problems at that time.      [place]  The nearest 24 hour pharmacy is:    [facility] [place] [place] [address]  [place]  [facility])  Call to start or finish an application, compare plans, enroll or ask a question.  [place]: [phone]  Web:  Healthcare.gov    Help Enrolling in [place]  [phone] (TOLL-FREE)  8631923222 (TTY)  Web:  Http://www.coverva.org    Local Help Enrolling in the  [place]  [phone] (MAIN)  Email:  health-help_0 .org  Web:  http://lewis-perez.info/  Address:  [address] [place], [place] [address]    SEDATING MEDICATIONS  Sedating medications include strong pain medications (e.g. narcotics), muscle relaxers, benzodiazepines (used for anxiety and as muscle relaxers), Benadryl/diphenhydramine and other antihistamines for allergic reactions/itching, and other medications.  If you are unsure if you have received a sedating medication, please ask your physician or nurse.  If you received a sedating medication: DO NOT drive a car. DO NOT operate machinery. DO NOT perform jobs where you need to be alert.  DO NOT  drink alcoholic beverages while taking this medicine.     If you get dizzy, sit or lie down at the first signs. Be careful going up and down stairs.  Be extra careful to prevent falls.     Never give this medicine to others.     Keep this medicine out of reach of children.     Do not take or save old medicines. Throw them away when outdated.     Keep all medicines in a cool, dry place. DO NOT keep them in your bathroom medicine cabinet or in a cabinet above the stove.    MEDICATION REFILLS  Please be aware that we cannot refill any prescriptions through the ER. If you need further treatment from what is provided at your ER visit, please follow up with your primary care doctor or your pain management specialist.    [place]  Did you know [facility] has two freestanding ERs located just a few miles away?  [facility] [place] have short wait times, easy free parking directly in front of the building and top patient satisfaction scores - and the same Board Certified Emergency Medicine doctors as [facility].

## 2016-05-21 NOTE — Progress Notes (Signed)
Transitional Care Management    CHF Management-[place]-- with spouse    Does the patient understand what could make their heart failure worse (diet, liquids, activity, weight, smoking, infections): Yes- poor diet, lack of exercise, infections, not taking medications right    Can the patient identify the signs and symptoms that CHF is worsening: Yes     Can the patient identify what to do if he/she experiences these symptoms: Yes- Call Cardiologist to report symptoms. Schedule an emergent appt. If needed      Physician Follow Up/Medication Follow Up--    Patient seen by PCP or cardiologist within critical timeframe of hospital discharge: No  -  No, patient is scheduled to see Dr. May on 05/21/16, CM recommended seeing PCP within 3-5 days and calling CM [name] if unable to secure 3-5 day appointment, contact information given.    Were any medications changed at follow up medical appointments:  No        Depression Screen--    Over the past 4 weeks, has the patient felt down, depressed or hopeless:  No      Over the past 4 weeks, has the patients felt little interest or pleasure in doing things:  No      What resource(s) provided if positive response to one or both of the depression screen questions: Pt's spouse feels pt. May have some symptoms of early dementia and CM instructed spouse to contact PCP for mini mental health status exam and CM will assist spouse based upon the result.    Gaps in Care/Resource Connection--    Living situation (alone, family, home environment, etc): Pt resides with his spouse in [place], [place]        Call Summary Notes-- Spoke to pt's spouse who reported pt. Was doing pretty good. Spouse was able to teach back symptoms of CHF with no difficulty. Pt. Is not experiencing any at this time. Weight is steady at 132lbs. Spouse completed the Depression Screen and there is no indication that pt. Is depressed. However, spouse feels pt. May have some minor dementia symptoms and CM suggested a  mini-mental health examine through pt's PCP. Pt. Will see Dr. Comer Locket today for an appt. CM will contact spouse next week for further CHF Management.    [name], [place], [facility]  T [phone]  F [phone]

## 2016-05-21 NOTE — Patient Instructions (Addendum)
Changes for Today:  1.  There is a kink in your PICC line.  You will need to have it evaluated and eventually replaced.  We will call our emergency department today to facilitate this.  2.  Milrinone is a medication that helps you feel better but does not reverse or cure your heart failure.  3.  Please establish care with an oncologist.  Potential options include:    [place]  [phone]  Monday-Friday 8:00 AM to 5:30 PM    [facility] H[facility]ology Oncology   [address]  [place]  [place], [place] [address] [facility] Directions]   [phone]   [place]  [place]  [place], [place] [address] [facility] Directions]   [phone]   [place]  [place], [place] [address] [facility] Directions]   [phone]   [address]  [place], [place] [address] [Get Directions]   [phone]     4.  INCREASE bumex to 6 mg TWICE A DAY. Call Levada Dy on Thursday to let us know what your weight is. [phone]  5.  INCREASE spironolactone to 25 mg ONCE A DAY.  6.  Please see a nephrologist of your choice.  The physician listed below saw you when you were hospitalized at [facility]:  Dr. Gwenevere Ghazi  [address] [place], [place] [address]   Phone: [phone]    7.  Return to the clinic in one week with Dr. Lu Duffel for a heart failure appointment.    Living with Heart Failure Information:  1. Follow a sodium restricted diet. Eat no more than 2000 milligrams of sodium per day.  Read labels of packaged foods if you are not familiar with the sodium content per serving.   2. Follow a 64 ounce fluid restriction. This is approximately equal to 2 liters, or 2000 cc's of fluid. Count all of the sources of fluid in your diet: drinks, fruits, vegetables, soups, etc.  3. Weigh yourself daily.  4. Report to our nurses a change in your weight of 2 pounds or more in one day, or 4 pounds or more in one week.  Call if you experience a significant change in your heart failure symptoms.  5. Anticipate refills for your  medications.   6. Maintain regular exercise. Try to exercise at a level that is comfortable for you for 30 minutes, 4 to 7 times per week.   7. Remain up to date with your vaccinations. Obtain a flu shot annually during flu season. Obtain a pneumococcal vaccination at least once prior to the age of 25, and repeat this again once you pass the age of 40 if it has been 5 years since your last vaccination.  8. Keep track of your appointments with our clinic and with your other doctors. Please call us if you think that you will miss your appointment with [place].  9. Phone numbers:   - Front Desk: [phone]   - [name], NP: [phone]   - Joan Flores, RN: [phone]   - Stana Bunting, NP: [phone]   - Anette Riedel, RN [phone]

## 2016-05-21 NOTE — Progress Notes (Signed)
Both lumens were flushed and have excellent blood return. PICC is ready to use pump is no longer alarming. Spoke with Dr Durene Fruits.

## 2016-05-21 NOTE — ED Provider Notes (Signed)
EMERGENCY DEPT VISIT NOTE      Patient information was obtained from patient, wife    History/Exam limitations: none.      History of Present Illness    Chief Complaint  Vascular Access Problem      Gregory [name] is a 70 y.o. male with PMHx significant for asthma, arthritis, CHF, malignant neoplasm, gout, cardiac amyloidosis, w/ PICC line p/w concern for possible occlusion of PICC line. Family report that the machine is messaging "down occlusion".   Was told to come here to have PICC line reassessed and poss replaced  No bleeding from the site    Taking milrinone via PICC.  Denies chest pain, SOB, fatigue is at baseline.      Past Medical History:   Diagnosis Date   . Arthritis     bilat shoulders, lt knee   . Asthma without status asthmaticus     childhood   . Cardiac amyloidosis    . CHF (congestive heart failure)    . Fracture of unspecified bones     1974 lt shoulder   . Gout    . Malignant neoplasm      Family History   Problem Relation Age of Onset   . Family history unknown: Yes     No current facility-administered medications for this encounter.      Current Outpatient Prescriptions   Medication Sig Dispense Refill   . aspirin EC 81 MG EC tablet Take 81 mg by mouth daily.     . bumetanide (BUMEX) 2 MG tablet Take 2 tablets (4 mg total) by mouth 2 (two) times daily. 540 tablet 3   . dexamethasone (DECADRON) 4 MG tablet Take 5 tablets (20 mg total) by mouth once a week.     [name] HYDROcodone-acetaminophen (NORCO) 5-325 MG per tablet Take 1 tablet by mouth every 6 (six) hours as needed for Pain.for up to 10 doses 10 tablet 0   . Lenalidomide (REVLIMID) 5 MG Cap Take 5 mg by mouth every other day.Days 1-21. Hold for 7 days. For Amyloidosis (Patient taking differently: Take 2.5 mg by mouth daily.Days 1-21 scheduled to start on 05/07/16. Hold for 7 days. For Amyloidosis    )     . metOLazone (ZAROXOLYN) 5 MG tablet Take 5 mg by mouth as needed (PRN Sun, Tues Thus if weight >135).     . milrinone (PRIMACOR) 20-5  MG/100ML-% infusion Infuse 23.0625 mcg/min into the vein continuous.     . predniSONE (DELTASONE) 5 MG tablet Take 2 tablets (10 mg total) by mouth every morning with breakfast. 9 tablet 0   . prochlorperazine (COMPAZINE) 10 MG tablet Take 10 mg by mouth every 6 (six) hours as needed (Nausea).     [name] spironolactone (ALDACTONE) 25 MG tablet Take 1 tablet (25 mg total) by mouth daily. 90 tablet 3     No Known Allergies  Social History     Social History   . Marital status: Married     Spouse name: N/A   . Number of children: N/A   . Years of education: N/A     Occupational History   . Not on file.     Social History Main Topics   . Smoking status: Never Smoker   . Smokeless tobacco: Never Used   . Alcohol use Yes      Comment: rare   . Drug use: No   . Sexual activity: No     Other Topics Concern   .  Not on file     Social History Narrative   . No narrative on file       Review of Systems    Positive and negative systems as per HPI.  All other systems reviewed and are negative.      Physical Exam  BP 97/63   Pulse 99   Temp 97.9 F (36.6 C) (Temporal Artery)   Resp 18   Ht _0  (1.676 m)   Wt 61.7 kg   SpO2 99%   BMI 21.95 kg/m     Nursing note and vitals reviewed  Constitutional: Elderly and chronically ill-appearing. No acute distress. Sitting up  comfortably, interactive, speaks clearly & completely  Cardiovascular: Normal rate, regular rhythm. 2+ rad pulses bilat  Pulmonary: Effort normal   Musculoskeletal: Right upper extremity PICC line, mildly withdrawn.  The family states this is how it was placed.  No obvious mechanical kink or tear, no swelling or erythema, no induration or fluctuance  Neurological: Alert and oriented to person, place, and time. Nml gait no ataxia  Skin: Skin is warm and dry. No rash noted.    Psychiatric: Normal speech and behavior.      ED treatment:    Medical records reviewed    O2 sat-                   saturation: 98 %; Oxygen use: room air; Interpretation: Normal            Consults: PICC team      ED Course & MDM:     Patient with reported PICC line malfunction at home.  Received troubleshooting here by access team, who ensured both lines were flowing, pump was replaced and was functioning fine without alarming    Patient refused x-ray prior to discharge    1:55 PM - Pt was adamant about leaving. Left prior to [place] papers    2:07 PM - Spoke with MD. Report that they wanted to replace the PICC line however pt has been discharged. They were worried about mechanical kinking that is positional. They will follow-up today and try to arrange for outpatient iron replacement tomorrow        Diagnosis:   1. S/P PICC central line placement      Disposition:   ED Disposition     ED Disposition Condition Date/Time Comment    Discharge  Tue May 21, 2016  1:43 PM [name] discharge to home/self care.    Condition at disposition: Stable        Condition: Stable      Arvin Collard, MD  1:03 PM      *This note was generated by the Epic EMR system/ Dragon speech recognition and may contain inherent errors or omissions not intended by the user. Grammatical errors, random word insertions, deletions, pronoun errors and incomplete sentences are occasional consequences of this technology due to software limitations. Not all errors are caught or corrected. If there are questions or concerns about the content of this note or information contained within the body of this dictation they should be addressed directly with the author for clarification.*    I was acting as a Education administrator for Pecola Haxton, Bevelyn Buckles, MD on [place]  I am the first provider for this patient and I personally performed the services documented. Ahmed Kotb is scribing for me on [place],Trelon T. This note accurately reflects work and decisions made by me.   Jeslyn Amsler,  Bevelyn Buckles, MD     Salome Spotted, MD  05/21/16 [phone]

## 2016-05-21 NOTE — Progress Notes (Signed)
I had the pleasure of seeing [name] today in the [facility] at [facility].  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 He responded to CyBorD (January - August 2016) though relapsed (increased serum free light chains from 35 in October 2017 to 378 in October 2017) and was started on revlimid 5 mg QOD on 04/09/16. I met the patient when he was hospitalized at [facility] 11/17-11/27/17, during which he was started on milrinone 0.375 mcg/kg/min. He has been under the care of physicians at [facility], though is planning on receiving care locally from this point on.  Gregory [name] was last seen on 05/13/16 and returns to the clinic as previously scheduled for hospital follow-up.     From a cardiovascular standpoint, Gregory [name] states has done well.  He notes increased energy levels and has been active, getting out of the house for errands and activities. His appetite is good and he denies any abdominal symptoms. He continues to have significant leg edema and has resumed sleeping in his recliner as he is able to better elevate his legs at night.    He has had alarms on his infusion pump, which he and his wife have been to clear. However, today while in clinic, the pump began alarming with line occlusion message. The pump was cleared several times though continued to alarm with occlusion. When looking at the line, when the patient bends his elbow, he creates a kink in the line, which has evidence of fatigue in the area that is creased/kinked.    He denies any fever, chills, night sweats, lightheadedness, dizziness, presyncope or syncope, shortness of breath at rest or significant DOE, orthopnea, PND, increasing fatigue or decreasing exercise tolerance. He has had no chest pain or pressure.  His appetite has been stable and he denies any abdominal bloating or distention, early satiety, anorexia, nausea,  vomiting or changes in bowel or bladder habits.     Gregory [name] reports that he has not been exercising other than walking with ADLs.  He does not check blood pressures at home since discharge.  He has been checking morning weights; his weight has been stable at 132-135 lbs.  Gregory [name] has been following a fluid restriction of 1.5 L/day and has restricting dietary sodium content to no more than 2 gm/day.  He reports that he has been compliant with his medications.      Review of Cardiovascular Testing    Echocardiogramon 11/18/17shows a left ventricular end-diastolic dimension of 62VO, concentric LVH (walls 15 mm), global HKwith a left ventricular ejection fraction of 20-25%, and grade 3diastolic dysfunction. The right ventricle is dilated with reduced systolic function. The left atrium isdilated and the right atrium isdilated. The aortic valve is trileaflet withoutevidence of stenosis; there is trivialinsufficiency. The mitral valve is morphologically normal with mildregurgitation. There is mildtricuspid regurgitation. Pulmonary systolic pressure is estimated to be 5mHg above right atrial pressure (10-131mg). There is trivialpulmonic insufficiency. A pericardial effusion is notpresent.    ECG Findingson 05/03/16: sinus rhythm, left anterior fascicular block and inferior MI, low voltage. As compared to hisprevious ECG on 11/1/17there is nono significant change.     Right heart catheterization on 05/07/16 (prior to milrinone) showed a RA pressure of 8 mmHg, RV pressure of 24/8 mmHg, PA pressure of 29/9/16 mmHg, and a PW pressure of 13 mmHg.  PA saturation was 45.8%.  Cardiac output by Fick calculation  was 1.9 L/min with a corresponding index of 1.2 L/min/m2.   Cardiac output by thermodilution was 1.6 L/min with a corresponding index of 0.95 L/min/m2.  PVR is 1.6 WU.    Patient Active Problem List    Diagnosis Date Noted   . Cardiac amyloidosis 05/06/2016   . Chronic renal failure, stage 3  (moderate) 05/04/2016   . Hyperkalemia 05/03/2016   . Light chain (AL) amyloidosis    . Bilateral lower extremity edema    . Elevated troponin 04/21/2014   . Chronic combined systolic and diastolic heart failure 16/03/9603   . Asthma without status asthmaticus    . Primary localized osteoarthrosis, shoulder region 06/18/2013   . Ganglion cyst of wrist, right 12/21/2012     Past Surgical History:   Procedure Laterality Date   . ARTHROPLASTY, SHOULDER, TOTAL  06/18/2013    Procedure: ARTHROPLASTY, SHOULDER, TOTAL;  Surgeon: Wendi Snipes, MD;  Location: [facility];  Service: Orthopedics;  Laterality: Left;   . COLONOSCOPY     . EXCISION, GANGLION  12/19/2012    Procedure: EXCISION, GANGLION;  Surgeon: Jillyn Hidden, MD;  Location: [facility];  Service: Orthopedics;  Laterality: Right;  injection right and left shoulder   . HERNIA REPAIR      lt inguinal age 70   . INJECTION, MEDICATION  12/19/2012    Procedure: INJECTION, MEDICATION;  Surgeon: Jillyn Hidden, MD;  Location: [facility];  Service: Orthopedics;  Laterality: Bilateral;     Current Outpatient Prescriptions   Medication Sig Dispense Refill   . aspirin EC 81 MG EC tablet Take 81 mg by mouth daily.     . bumetanide (BUMEX) 2 MG tablet Take 2 tablets (4 mg total) by mouth 2 (two) times daily. 60 tablet 0   . milrinone (PRIMACOR) 20-5 MG/100ML-% infusion Infuse 23.0625 mcg/min into the vein continuous.     . predniSONE (DELTASONE) 5 MG tablet Take 2 tablets (10 mg total) by mouth every morning with breakfast. 9 tablet 0   . spironolactone (ALDACTONE) 25 MG tablet Take 0.5 tablets (12.5 mg total) by mouth daily. 30 tablet 0   . dexamethasone (DECADRON) 4 MG tablet Take 5 tablets (20 mg total) by mouth once a week.     Gregory [name] Kitchen HYDROcodone-acetaminophen (NORCO) 5-325 MG per tablet Take 1 tablet by mouth every 6 (six) hours as needed for Pain.for up to 10 doses 10 tablet 0   . Lenalidomide (REVLIMID) 5 MG Cap Take 5 mg by mouth every other day.Days 1-21.  Hold for 7 days. For Amyloidosis (Patient taking differently: Take 2.5 mg by mouth daily.Days 1-21 scheduled to start on 05/07/16. Hold for 7 days. For Amyloidosis    )     . metOLazone (ZAROXOLYN) 5 MG tablet Take 5 mg by mouth as needed (PRN Sun, Tues Thus if weight >135).     . prochlorperazine (COMPAZINE) 10 MG tablet Take 10 mg by mouth every 6 (six) hours as needed (Nausea).       No current facility-administered medications for this visit.      No Known Allergies    Family History:  Reviewed and updated in the patient's EMR.    Social History:  Reviewed and updated in the patient's EMR.    ROS is as described above.  A full, detailed ROS was performed today and is otherwise negative.    Vital Signs:  BP 98/68   Pulse (!) 109   Temp 97.3 F (36.3  C) (Oral)   Wt 64.3 kg (141 lb 12.8 oz)   BMI 23.60 kg/m .  Estimated body mass index is 23.6 kg/m as calculated from the following:    Height as of 05/03/16: 1.651 m (_0 ).    Weight as of this encounter: 64.3 kg (141 lb 12.8 oz).   Wt Readings from Last 3 Encounters:   05/21/16 64.3 kg (141 lb 12.8 oz)   05/13/16 59.9 kg (132 lb 1.6 oz)   04/29/16 63.5 kg (140 lb)      BP Readings from Last 3 Encounters:   05/21/16 98/68   05/13/16 93/60   04/29/16 101/70        Physical Exam:  General:  alert, appears stated age, cooperative and no distress  HEENT:  conjunctiva pale, bucal mucosa moist without streaking or exudate.  JVD - 2 cm above sternal notch, no adenopathy, no carotid bruit and supple, symmetrical, trachea midline. Large v wave noted.  Lungs:  clear to auscultation bilaterally with normal rate and effort  Cardiovascular:  prominent apical impulse, regular rate and rhythm, S1, S2 normal and no S3 or S4  Abdominal:  soft, non-tender; bowel sounds normal; no masses,  no organomegaly.  Hepatojular reflux is present.  Extremities:  edema 3+ pitting edema bilaterally.  Extremities are warm to the touch.    Neuromuscular exam:  Grossly non-focal though not  formally tested.  Normal muscle tone.     Laboratories  I reviewed the patient's most recent lab results obtained in preparation for today's visit:    Assessment and Plan:    Cardiomyopathy: In summary, Mr. Halley has a stage D NICM in the setting of AL amyloidosis and is on palliative milrinone 0.375 mcg/kg/min. He is currently NYHA functional class II-III.  The patient appears better compensated, though there is still need for further diuresis. I have increased his bumetanide to 6 mg BID and his spironolactone to 25 mg QD. He will contact us on 12/7 with an update regarding his weight. He is not on BB, ACE/ARB/ARNI due to hypotension and renal failure.    The patient has not established with oncology in [place] and I have provided him with contact information for [facility] as well as [facility]. The patient should restart dexamethasone and revlimid for treatment of his AL amyloidosis.    The patient will go to the ED to have his PICC exchanged, as the integrity of his current line is compromised by the repeated kinking of the line when he flexes his elbow.    Electrophysiology:  [facility] does not have a device.    Metabolic review:  FXT0W on 03/30/16 was 5.5%.  Lipid panel on 03/30/16: total 156, triglycerides 94, LDL 113, and HDL 24.     Renal Function:  [name] renal function on 05/13/16 was:  BUN of 81, creatinine of 2.5, and eGFR of 31.  Electrolytes were not normal.  Serum Na was 134.    Follow-up:  We have asked Mr. Muzzy to return to the [facility] in 1 weeks.  As always, it is a pleasure to participate in the care of Mr. Draheim, and if you have any questions or concerns, please feel free to contact us at [phone].

## 2016-05-22 ENCOUNTER — Other Ambulatory Visit: Payer: Self-pay | Admitting: Acute Care

## 2016-05-22 ENCOUNTER — Telehealth: Payer: Self-pay

## 2016-05-22 DIAGNOSIS — I5022 Chronic systolic (congestive) heart failure: Secondary | ICD-10-CM

## 2016-05-22 NOTE — Telephone Encounter (Signed)
-----   Message from Cindie Crumbly, NP sent at 05/22/2016  2:03 PM EST -----  Regarding: RE: scheduling request  I spoke with Dr. Gaynelle Adu.  Schedule the PICC change but patient will be evaluated in [facility] first.  New order is not needed.  ----- Message -----  From: Roanna Banning  Sent: 05/22/2016   1:23 PM  To: Shirlee More Redditt, #  Subject: scheduling request                               Dr Harrell Gave May is requesting a PICC line replacement.  Per Morey Hummingbird, in his office, the line kinked and patient was seen at the [facility] ED where the line was flushed.  They would like to have it replaced before it kinks again.  The order in EPIC was entered as PICC line placement.  Please advise if can schedule and it ordered needs to be corrected to PICC check/change.

## 2016-05-22 NOTE — Progress Notes (Signed)
Spoke to Carlisle at [facility] at [facility] to request PICC line replacement.  Order placed in epic with instructions to reposition the line to avoid kinking.  Office visit note faxed to IR. Mr. Slayton updated and agreeable with this plan.  Instructed him to go to the ED should the line occlude prior to it being replaced.

## 2016-05-23 ENCOUNTER — Encounter: Payer: Self-pay | Admitting: Advanced Heart Failure and Transplant Cardiology

## 2016-05-23 ENCOUNTER — Telehealth: Payer: Self-pay | Admitting: Acute Care

## 2016-05-23 NOTE — Telephone Encounter (Signed)
Spoke to Gregory [name] to ask what his weights have been since increasing bumex to 6 mg BID.  He was not aware of his medication regimen and told me I needed to speak to his wife who manages his meds. She confirmed he has been taking the increased bumex since Tuesday afternoon.  Weight on Tuesday and Wednesday was 132 lbs, he has not yet weighed himself today.  LE edema stable.  Instructed her to have him continue bumex 6 BID today and call me tomorrow morning to let me know what his weight is today and tomorrow.  Return contact information provided.  She verbalized understanding and had no additional questions.

## 2016-05-23 NOTE — Patient Instructions (Addendum)
Exam-specific instructions: Reviewed the following with patient's wife.     Medication and NPO instructions: Pt does not need to fast and can take his medications as he normally does.    Clothing instructions: Instructed patient to wear loose, comfortable clothing.      Arrival instructions: Instructed patient to arrive at 1030 via the Patient Entrance.  Instructed patient where the [facility] is located and about parking options.  Pt's wife will be with him.    Other reminders: Instructed to bring something to keep occupied in case of any unforeseen delays.

## 2016-05-24 ENCOUNTER — Ambulatory Visit: Payer: Medicare Other | Admitting: Interventional Radiology and Diagnostic Radiology

## 2016-05-24 ENCOUNTER — Ambulatory Visit
Admission: RE | Admit: 2016-05-24 | Discharge: 2016-05-24 | Disposition: A | Discharge status: Home / Self Care | Payer: Medicare Other | Source: Ambulatory Visit | Attending: Interventional Radiology and Diagnostic Radiology | Admitting: Interventional Radiology and Diagnostic Radiology

## 2016-05-24 ENCOUNTER — Encounter
Admission: RE | Disposition: A | Discharge status: Home / Self Care | Payer: Self-pay | Source: Ambulatory Visit | Attending: Interventional Radiology and Diagnostic Radiology

## 2016-05-24 ENCOUNTER — Telehealth: Payer: Self-pay | Admitting: Acute Care

## 2016-05-24 DIAGNOSIS — I5022 Chronic systolic (congestive) heart failure: Secondary | ICD-10-CM | POA: Insufficient documentation

## 2016-05-24 DIAGNOSIS — Z7982 Long term (current) use of aspirin: Secondary | ICD-10-CM | POA: Insufficient documentation

## 2016-05-24 DIAGNOSIS — Z79899 Other long term (current) drug therapy: Secondary | ICD-10-CM | POA: Insufficient documentation

## 2016-05-24 SURGERY — PICC LINE CHECK/CHANGE

## 2016-05-24 MED ORDER — LIDOCAINE HCL (PF) 1 % IJ SOLN
INTRAMUSCULAR | Status: AC
Start: 2016-05-24 — End: 2016-05-24
  Administered 2016-05-24: 8 mL
  Filled 2016-05-24: qty 30

## 2016-05-24 MED ORDER — LIDOCAINE-EPINEPHRINE 2 %-1:200000 IJ SOLN
INTRAMUSCULAR | Status: AC
Start: 2016-05-24 — End: 2016-05-24
  Administered 2016-05-24: 10 mL
  Filled 2016-05-24: qty 20

## 2016-05-24 NOTE — Progress Notes (Signed)
Discharge criteria met. Discharge teaching given . Written copy provided. Pt  able to verbalize understanding of discharge teaching.  Both  PIC removal and Tunneled catheter placement Dressing remains CDI. No oozing/ hematoma noted. Pt alert and oriented, denies pain. Escorted to lobby. Discharged to home.

## 2016-05-24 NOTE — Progress Notes (Signed)
Pt back to recovery area, S/P PIC line removal and Tunneled catheter placement. . A&ox4, VSS. Site covered with DSG, CDI. No oozing or hematoma noted. Denies pain, level 0/10  Maintained on bedrest. Resting comfortably. Tolerating meal.

## 2016-05-24 NOTE — Sedation Documentation (Signed)
Patient arrived in Lab A for single lumen Power Line Placement. Patient moved independently to table and monitors applied. VSS on room air with 0 complaints of pain. Patient prepped per protocol.

## 2016-05-24 NOTE — Sedation Documentation (Signed)
Patient tolerated procedure well. CVC Power Line placed by Dr. Jamal Collin. Pt on continuous  Milrinone infusion via PICC. PICC line D/c'd with catheter intact. Medication is now flowing without difficulty to CVC. Patient to return to [facility] to be discharged

## 2016-05-27 ENCOUNTER — Telehealth: Payer: Self-pay | Admitting: Acute Care

## 2016-05-27 NOTE — Telephone Encounter (Signed)
Received a voicemail from Schubert with [facility] letting me know Gregory [name]'s weight today is 142.8 lbs (132 lbs on 12/7) and that Gregory [name] placed a single lumen subclavian PICC line on Friday.  She reports he has 3+ LE edema; continues to take bumex 6 mg BID.      I spoke to the patient's wife who confirms the weight increase but feels his LE edema is improved.  She reports Gregory [name] is "feeling fine".  I inquired as to why she did not call to report his weights on Friday as we had previously discussed and she said she "is not a computer", can't remember everything and was busy taking Gregory [name] for his PICC line procedure. I confirmed Gregory [name]'s appointment with Dr. Lu Duffel tomorrow and let her know I will update Dr. May and call her back should he want to make any medication changes tonight.  She verbalized understanding and had no additional questions.

## 2016-05-27 NOTE — Telephone Encounter (Signed)
Left message asking patient or wife to return my call to discuss Sailor's weights over the past few days.  Return contact information provided.

## 2016-05-28 ENCOUNTER — Ambulatory Visit (HOSPITAL_BASED_OUTPATIENT_CLINIC_OR_DEPARTMENT_OTHER): Payer: Medicare Other | Admitting: Advanced Heart Failure and Transplant Cardiology

## 2016-05-28 VITALS — BP 98/62 | HR 113 | Temp 97.6°F | Wt 146.9 lb

## 2016-05-28 DIAGNOSIS — I5032 Chronic diastolic (congestive) heart failure: Secondary | ICD-10-CM

## 2016-05-28 DIAGNOSIS — I428 Other cardiomyopathies: Secondary | ICD-10-CM

## 2016-05-28 DIAGNOSIS — I5022 Chronic systolic (congestive) heart failure: Secondary | ICD-10-CM

## 2016-05-28 NOTE — Patient Instructions (Signed)
Changes for Today:  1. Since you will need peripherally stuck for labs due to the type of catheter, once they are stable we can start spacing out those draws.  2. Continue reading food labels for the sodium content.  3. Call us with the dose of metolazone you were taking (2.5 mg to 5 mg). Goal weight less than 140 lbs.  4. Based on you blood work, we will let you know if to take metolazone and if you need more spironolactone or potassium supplementation.  5. Compression socks on in the morning and off at night.  6. Make an appointment with the oncologist. Let us know that date and if we need to help move it up.    [place]  [phone]  Monday-Friday 8:00 AM to 5:30 PM    [place] [facility]   [address]  [place]  [place], [place] [address]  [place]  [place]  [place], [place] [address]   [phone]    [address]  [place], [place] [address]  [place]  [place], [place] [address]  [phone]     Return to the clinic in 1 week with a NP or Dr. Lu Duffel for a heart failure appointment.    Living with Heart Failure Information:  1. Follow a sodium restricted diet. Eat no more than 2000 milligrams of sodium per day.  Read labels of packaged foods if you are not familiar with the sodium content per serving.   2. Follow a 64 ounce fluid restriction. This is approximately equal to 2 liters, or 2000 cc's of fluid. Count all of the sources of fluid in your diet: drinks, fruits, vegetables, soups, etc.  3. Weigh yourself daily.  4. Report to our nurses a change in your weight of 2 pounds or more in one day, or 4 pounds or more in one week.  Call if you experience a significant change in your heart failure symptoms.  5. Anticipate refills for your medications.   6. Maintain regular exercise. Try to exercise at a level that is comfortable for you for 30 minutes, 4 to 7 times per week.   7. Remain up to date with your  vaccinations. Obtain a flu shot annually during flu season. Obtain a pneumococcal vaccination at least once prior to the age of 52, and repeat this again once you pass the age of 30 if it has been 5 years since your last vaccination.  8. Keep track of your appointments with our clinic and with your other doctors. Please call us if you think that you will miss your appointment with [place].  9. Phone numbers:   - Front Desk: [phone]   - [name], NP: [phone]   - Joan Flores, RN: [phone]   - Stana Bunting, NP: [phone]   - Anette Riedel, RN [phone]

## 2016-05-28 NOTE — Progress Notes (Signed)
I had the pleasure of seeing [name] today in the [facility].  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 He responded to CyBorD (January - August 2016) though relapsed (increased serum free light chains from 35 in October 2017 to 378 in October 2017) and was started on revlimid 5 mg QOD on 04/09/16. I met the patient when he was hospitalized at [facility] 11/17-11/27/17, during which he was started on milrinone 0.375 mcg/kg/min. He has been under the care of physicians at [facility], though is planning on receiving care locally from this point on.  Gregory [name] was last seen on 05/21/16 and returns to the clinic as previously scheduled.     When I saw the patient last week, he was experiencing occlusion alarms on his pump, and there was a kink at the hub of his dual lumen PICC due to creasing of the catheter when he fully flexed his arm. He was seen in the [place] ED, and the PICC nurse did not appear to understand the issue at hand, as documentation mentions that the line flushed without problem. We arranged for the patient to have the PICC replaced at [facility] on 05/24/16; the patient received a single lumen right sided tunneled catheter.    From a cardiovascular standpoint, Gregory [name] states has done well.  He reports no change in symptoms since I last him last week. He recounts that he was out chasing his dog earlier in the week, walking quickly and in cold air; he did not have significant dyspnea. His appetite remains good and stable. He has not noticed any significant change in his leg edema despite an increase in his weight. He denies abdominal symptoms. He is wearing compression stockings on his legs regularly and is elevating his legs when seated.    During the course of conversation, the patient asks about multiple high Na containing foods, including sausage,  breakfast biscuits, going to [employer], eating chips, hot dogs and beans. It is unclear whether he is adhering to his Na restriction and is simply asking questions.    He has not called for an initial appointment with a local oncologist.     He denies any fever, chills, night sweats, lightheadedness, dizziness, presyncope or syncope, shortness of breath at rest or significant DOE, orthopnea, PND, increasing fatigue or decreasing exercise tolerance. He has had no chest pain or pressure.  His appetite has been stable and he denies any abdominal bloating or distention, early satiety, anorexia, nausea, vomiting or changes in bowel or bladder habits.  He has had no hospital admissions or emergency room visits.    Gregory [name] reports that he has not been exercising other than walking with ADLs.  He does not check blood pressures at home since discharge.  He has been checking morning weights; his weight has been stable at 141-142 lbs.  Gregory [name] has been following a fluid restriction of 1.5 L/day and has restricting dietary sodium content to no more than 2 gm/day.  He reports that he has been compliant with his medications.     Review of Cardiovascular Testing    Echocardiogramon 11/18/17shows a left ventricular end-diastolic dimension of 13RY, concentric LVH (walls 15 mm), global HKwith a left ventricular ejection fraction of 20-25%, and grade 3diastolic dysfunction. The right ventricle is dilated with reduced systolic function. The left atrium isdilated and the right atrium isdilated. The  aortic valve is trileaflet withoutevidence of stenosis; there is trivialinsufficiency. The mitral valve is morphologically normal with mildregurgitation. There is mildtricuspid regurgitation. Pulmonary systolic pressure is estimated to be 66mHg above right atrial pressure (10-185mg). There is trivialpulmonic insufficiency. A pericardial effusion is notpresent.    ECG Findingson 05/03/16: sinus rhythm, left  anterior fascicular block and inferior MI, low voltage. As compared to hisprevious ECG on 11/1/17there is nono significant change.     Right heart catheterizationon 05/07/16 (prior to milrinone) showed a RA pressure of 8 mmHg, RV pressure of 24/8 mmHg, PA pressure of 29/9/16 mmHg, and a PW pressure of 13 mmHg. PA saturation was 45.8%. Cardiac output by Fick calculation was 1.9 L/min with a corresponding index of 1.2 L/min/m2. Cardiac output by thermodilution was 1.6 L/min with a corresponding index of 0.95 L/min/m2. PVR is 1.6 WU.    Patient Active Problem List    Diagnosis Date Noted   . Chronic systolic heart failure 1293/10/157 . Cardiac amyloidosis 05/06/2016   . Chronic renal failure, stage 3 (moderate) 05/04/2016   . Hyperkalemia 05/03/2016   . Light chain (AL) amyloidosis    . Bilateral lower extremity edema    . Elevated troponin 04/21/2014   . Chronic combined systolic and diastolic heart failure 1091/48/4803 . Asthma without status asthmaticus    . Primary localized osteoarthrosis, shoulder region 06/18/2013   . Ganglion cyst of wrist, right 12/21/2012     Past Surgical History:   Procedure Laterality Date   . ARTHROPLASTY, SHOULDER, TOTAL  06/18/2013    Procedure: ARTHROPLASTY, SHOULDER, TOTAL;  Surgeon: NaWendi SnipesMD;  Location: [facility];  Service: Orthopedics;  Laterality: Left;   . COLONOSCOPY     . EXCISION, GANGLION  12/19/2012    Procedure: EXCISION, GANGLION;  Surgeon: McJillyn HiddenMD;  Location: [facility];  Service: Orthopedics;  Laterality: Right;  injection right and left shoulder   . HERNIA REPAIR      lt inguinal age [age] . INJECTION, MEDICATION  12/19/2012    Procedure: INJECTION, MEDICATION;  Surgeon: McJillyn HiddenMD;  Location: [facility];  Service: Orthopedics;  Laterality: Bilateral;     Current Outpatient Prescriptions   Medication Sig Dispense Refill   . aspirin EC 81 MG EC tablet Take 81 mg by mouth daily.     . bumetanide (BUMEX) 2 MG tablet  Take 2 tablets (4 mg total) by mouth 2 (two) times daily. (Patient taking differently: Take 6 mg by mouth 2 (two) times daily.    ) 540 tablet 3   . dexamethasone (DECADRON) 4 MG tablet Take 5 tablets (20 mg total) by mouth once a week.     . Marland KitchenYDROcodone-acetaminophen (NORCO) 5-325 MG per tablet Take 1 tablet by mouth every 6 (six) hours as needed for Pain.for up to 10 doses 10 tablet 0   . Lenalidomide (REVLIMID) 5 MG Cap Take 5 mg by mouth every other day.Days 1-21. Hold for 7 days. For Amyloidosis (Patient taking differently: Take 2.5 mg by mouth daily.Days 1-21 scheduled to start on 05/07/16. Hold for 7 days. For Amyloidosis    )     . metOLazone (ZAROXOLYN) 5 MG tablet Take 5 mg by mouth as needed (PRN Sun, Tues Thus if weight >135).     . milrinone (PRIMACOR) 20-5 MG/100ML-% infusion Infuse 23.0625 mcg/min into the vein continuous.     . predniSONE (DELTASONE) 5 MG tablet Take 2 tablets (10  mg total) by mouth every morning with breakfast. 9 tablet 0   . prochlorperazine (COMPAZINE) 10 MG tablet Take 10 mg by mouth every 6 (six) hours as needed (Nausea).     [name] spironolactone (ALDACTONE) 25 MG tablet Take 1 tablet (25 mg total) by mouth daily. 90 tablet 3     No current facility-administered medications for this visit.      No Known Allergies    Family History:  Reviewed and updated in the patient's EMR.    Social History:  Reviewed and updated in the patient's EMR.    ROS is as described above.  A full, detailed ROS was performed today and is otherwise negative.    Vital Signs:  BP 98/62 (BP Site: Left arm, Patient Position: Sitting, Cuff Size: Medium)   Pulse (!) 113   Temp 97.6 F (36.4 C) (Oral)   Wt 66.6 kg (146 lb 14.4 oz)   BMI 23.71 kg/m .  Estimated body mass index is 23.71 kg/m as calculated from the following:    Height as of 05/23/16: 1.676 m (_0 ).    Weight as of this encounter: 66.6 kg (146 lb 14.4 oz).   Wt Readings from Last 3 Encounters:   05/28/16 66.6 kg (146 lb 14.4 oz)   05/23/16 63  kg (139 lb)   05/21/16 61.7 kg (136 lb)      BP Readings from Last 3 Encounters:   05/28/16 98/62   05/24/16 128/86   05/21/16 97/63        Physical Exam:  General:  alert, appears stated age, cooperative and no distress  HEENT:  conjunctiva pale, bucal mucosa moist without streaking or exudate.  JVD - 4 cm above sternal notch, no adenopathy, no carotid bruit and supple, symmetrical, trachea midline. Large V waves are noted.  Lungs:  clear to auscultation bilaterally with normal rate and effort  Cardiovascular:  prominent apical impulse, regular rate and rhythm, S1, S2 normal and no S3 or S4  Abdominal:  soft, non-tender; bowel sounds normal; no masses,  no organomegaly.  Hepatojular reflux is present.  Extremities:  edema 2-3+ pitting edema of the legs bilaterally with compression stockings in place.  Extremities are warm to the touch.    Neuromuscular exam:  Grossly non-focal though not formally tested.  Normal muscle tone.     Laboratories  I reviewed the patient's most recent lab results obtained in preparation for today's visit:    Assessment and Plan:    Cardiomyopathy: In summary, [name] has a stage D NICM in the setting of AL amyloidosis and is on palliative milrinone 0.375 mcg/kg/min. He is currently NYHA functional class II-III.  The patient appears better compensated than prior to milrinone, though remains total body volume overloaded. I am concerned by the weight gain in the past week and the questions regarding diet, which causes concern regarding dietary compliance. I will need to restart his metolazone, as he is currently on bumetanide 6 mg BID. He is not on BB, ACE/ARB/ARNI due to hypotension and renal failure.    The patient has metolazone at home and will check the dose, though he believes it is 2.5 mg. Labs were drawn today by [facility] and once available, I will determine whether to increase spironolactone or resume KCl supplementation. I will plan on metolazone 2.5 mg QD until his weight reaches the  goal of 132-135 lbs. He will need continued frequent lab draws to follow his electrolytes.     We reviewed  dietary limitations again today, and I stressed the importance of calling one of the oncology offices for establishment of care. Once he is established with oncology, I will work with them to determine overall prognosis and begin discussion regarding the determined prognosis.    Electrophysiology:  Gregory [name] does not have a device.    Metabolic review:  VZC5Y on 03/30/16 was 5.5%.  Lipid panel on 03/30/16: total 156, triglycerides 94, LDL 113, and HDL 24.    Renal Function:  [name] renal function on 05/13/16 was:  BUN of 81, creatinine of 2.5, and eGFR of 31.  Electrolytes were not normal.  Serum Na was 134.    Follow-up:  We have asked [name] to return to the [facility] in 1 weeks.  As always, it is a pleasure to participate in the care of [name], and if you have any questions or concerns, please feel free to contact us at [phone].

## 2016-05-28 NOTE — Progress Notes (Signed)
Transitional Care Management    CHF Management-Teachback--    Does the patient understand what could make their heart failure worse (diet, liquids, activity, weight, smoking, infections): Yes- overweight, high cholesterol, too much fluid and salt, no exercise    Can the patient identify the signs and symptoms that CHF is worsening: Yes - weight gain, swelling, cough, sob    Can the patient identify what to do if he/she experiences these symptoms: Yes- Call [facility] to report symptoms and schedule an appt. If recommended      Wellness--    What is the patient's current/history of alcohol use/abuse: None    What is the patient's current/history of tobacco use/abuse: None    Does the patient need/want information on assistance programs for alcohol abuse or smoking cessation?  No    How often/much does the patient exercise: Pt. Is not currently exercising as he feels too weak with the IV infusions. Pt. Does get around the home but is not currently doing any formal exercise    Has the patient been given exercise guidance from his/her doctor:  No: No Restrictions    Education on exercise provided: Yes. CM and spouse discussed the importance of exercise on the heart and lungs    Is the patient having any issues with sleeping well:  No    Has the patient discussed these issues with his/her doctor:  Yes      CHF Self management/Health Maintenance--    Has the patient's doctor recommended a special diet: Yes    Has the patient been instructed on low salt and/or low fat intake: Yes    If on low salt diet, can patient describe how much is 2 grams of sodium: Yes    Diet education completed: Yes- CM and spouse discussed a low sodium diet and how to read labels for salt content. CM sent out Salt Information Guide to pt's home. Pt. Is not adding salt to foods, o cooking with salt and avoiding processed foods    Is the patient on fluid restriction: Yes    How many ounces of fluid can the patient have a day: 1.5L    Has the patient been  instructed on the following:   Influenza vaccine Yes   Pneumococcal vaccine Yes   Health record Yes   Smoking cessation/reduction No    Hand hygiene Yes    Advance Directives--    Has the patient participated in an advance care planning process:  No       Has the patient completed an advance care planning document:  No      Do all interested parties have a copy of the advance care planning document:  No      Does the patient want further information on advance directives:  Yes - CM sent out copies of 5 Wishes to pt. And wife to review and discuss if assistance is needed, CM will review with family    Educational materials mailed to patient:  Yes - 5 Wishes and Salt Diet            Call Summary Notes-- Spoke to pt's spouse who reported pt. Was doing okay. Pt. Had 1 PICC Line changed and will see Dr. Lu Duffel at [facility] today. Pt. Reportedly has no CHF symptoms, but weight is up to 142lbs and feet are swollen. Pt. Was already placed on 3 tablets of Bumex 2x/day, but urination has not increased. This will be discussed at his doctor's appt. Today. Pt. Is  following his fluid restriction and low sodium diet. CM and spouse discussed Advance Directives and CM sent out copies of the 5 Wishes for their review. CM will contact pt. Next week for further CHF Management.    [name], [place], [place]  T [phone]  F [phone]

## 2016-05-29 ENCOUNTER — Telehealth: Payer: Self-pay

## 2016-05-29 NOTE — Telephone Encounter (Signed)
Spoke to Gregory [name] regarding Gregory [name] blood work from 05/27/2016. Reviewed with Gregory [name], he is to take metolazone 5 mg daily until reaches dry weight of 132 lbs. We will have the home nurse repeat labs on Friday.    Gregory [name] repeated back he will take metolazone 30 minutes before morning Bumex. She will call with any questions.

## 2016-05-31 ENCOUNTER — Encounter: Payer: Self-pay | Admitting: Advanced Heart Failure and Transplant Cardiology

## 2016-06-03 ENCOUNTER — Telehealth: Payer: Self-pay | Admitting: Acute Care

## 2016-06-03 ENCOUNTER — Encounter: Payer: Self-pay | Admitting: Registered Nurse

## 2016-06-03 ENCOUNTER — Telehealth: Payer: Self-pay

## 2016-06-03 NOTE — Telephone Encounter (Signed)
Gregory [name] paged the heart transplant coordinator on call to report weight change, Friday 05/31/16 wt: 139.9 lb and Monday 06/03/2016 weight was 145.9 lbs. She stated that he had +3 LOEX edema, lethargy and dry skin. She stated that he is taking Metolazone 5 mg on Tuesdays, Thursdays and Sundays. She stated his pressure was 82/51 and HR 78. The home health RN is worried about him and the patient has an appointment tomorrow to be seen in the clinic, will assess fluid status and determine plan of care at that time.

## 2016-06-03 NOTE — Telephone Encounter (Signed)
Lab results from 05/31/16 reviewed. Cr 3.0.  Spoke to patient's wife who reports Gregory [name] has been taking metolazone 5 mg daily since 05/29/16, patient had not yet weighed himself by late morning today but weight yesterday was 144.  She reports he has lost "a pound or 2" since taking the increased metolazone.  Denies SOB, wife reports he is feeling fine.  Patient coming to clinic tomorrow; will assess volume status and adjust medications as appropriate.  [name], NP is scheduled to see the patient tomorrow and has been updated. She is agreeable with this plan.

## 2016-06-04 ENCOUNTER — Ambulatory Visit: Payer: Medicare Other | Attending: Registered Nurse | Admitting: Registered Nurse

## 2016-06-04 ENCOUNTER — Encounter: Payer: Self-pay | Admitting: Registered Nurse

## 2016-06-04 VITALS — BP 88/55 | HR 114 | Temp 97.5°F | Wt 153.0 lb

## 2016-06-04 DIAGNOSIS — N183 Chronic kidney disease, stage 3 unspecified: Secondary | ICD-10-CM

## 2016-06-04 DIAGNOSIS — I5042 Chronic combined systolic (congestive) and diastolic (congestive) heart failure: Secondary | ICD-10-CM

## 2016-06-04 DIAGNOSIS — R600 Localized edema: Secondary | ICD-10-CM

## 2016-06-04 DIAGNOSIS — E854 Organ-limited amyloidosis: Secondary | ICD-10-CM

## 2016-06-04 DIAGNOSIS — I428 Other cardiomyopathies: Secondary | ICD-10-CM

## 2016-06-04 DIAGNOSIS — I43 Cardiomyopathy in diseases classified elsewhere: Secondary | ICD-10-CM

## 2016-06-04 DIAGNOSIS — E8581 Light chain (AL) amyloidosis: Secondary | ICD-10-CM

## 2016-06-04 MED ORDER — POTASSIUM CHLORIDE CRYS ER 20 MEQ PO TBCR
40.0000 meq | EXTENDED_RELEASE_TABLET | Freq: Every day | ORAL | 11 refills | Status: DC
Start: 2016-06-04 — End: 2016-06-13

## 2016-06-04 NOTE — Progress Notes (Signed)
I had the pleasure of seeing Mr. Azeem Poorman today in the [facility] at [facility].  As you know, Isaish has a stage D NICM in the setting of AL amyloidosis which was diagnosed in December 2015. He responded to CyBorD (January - August 2016) though relapsed (increased serum free light chains from 35 in October 2017 to 378 in October 2017) and was started on revlimid 5 mg QOD on 04/09/16. I met the patient when he was hospitalized at [facility] 11/17-11/27/17, during which he was started on milrinone 0.375 mcg/kg/min. He has been under the care of physicians at [facility], though is planning on receiving care locally from this point on.  Deni was last seen on 05/28/16 and returns to the clinic as previously scheduled.     From a cardiovascular standpoint, Haywood states he has done poorly. His chief complaints today include left lower extremity pain, bilateral lower extremity swelling, weight gain, and increased fatigue. Junaid has continued to gain weight despite escalating his oral diuretics via telephone over the past week. His weight in our clinic scale has increased from 146 lbs to 153 lbs in the last 7 days. This weight increase correlates with his home scale readings. Last Friday, his diuretics were increased to Bumex 6 mg BID and Metolazone 5 mg daily. He has been compliant with his diuretics, although he missed one dose on Saturday. He reports that he has not noticed and increase in urgency, frequency, or volume of his urine since he started the increased diuretics. He continues to have edematous bilateral lower extremity swelling which makes it hard for him to ambulate. He has also developed a gout flare in his bilateral hands and left lower extremity. He required a wheelchair to come to his appointment today. He has been having a decreased activity tolerance due to a lack of physical ability and energy. He has become sedentary due to these symptoms.  He is able to complete his ADLs independently.       This morning he had a mechanical fall. He did not pass out, black out, or lose consciousness. He reported that he tripped over a box. EMS was called to help him off the ground and to assess him. He did not sustain any trauma. He does not have an ICD.    Mr. Monie has a fair appetite. He has been watching his salt and fluid intake, though he does not measure either. He reports intermittent early satiety and two episodes of nausea. He denies feeling full or bloated, but his wife reports he's abdomen is distended and his pants are tighter. He continues to wear bilateral lower extremity compression stockings and he has been elevating his legs.    Holdan has interrupted sleep. He sleeps 4-5 nights a week in his recliner. He is unable to lay flat; he sleeps on 2 pillows when he is in bed. He has a hard time getting comfortable at night and he cannot sleep on his back. He often feels cold and he sleeps with 2 electric blankets and a heater for comfort.     He has not called for an initial appointment with a local oncologist.     He denies any fever, chills, night sweats, lightheadedness, dizziness, shortness of breath at rest or significant DOE, orthopnea, PND. He has had no chest pain or pressure.  He has had no hospital admissions or emergency room visits.    He has been checking blood pressures at home;  they have been 100s/70s mmHg.  He has been checking morning weights; his weight have not been stable at 145 lbs. He has had no problems with his PICC line or Milrinone pump.     Review of Cardiovascular Testing    Echocardiogramon 11/18/17shows a left ventricular end-diastolic dimension of 39SU, concentric LVH (walls 15 mm), global HKwith a left ventricular ejection fraction of 20-25%, and grade 3diastolic dysfunction. The right ventricle is dilated with reduced systolic function. The left atrium isdilated and the right atrium isdilated. The aortic valve is  trileaflet withoutevidence of stenosis; there is trivialinsufficiency. The mitral valve is morphologically normal with mildregurgitation. There is mildtricuspid regurgitation. Pulmonary systolic pressure is estimated to be 18mHg above right atrial pressure (10-118mg). There is trivialpulmonic insufficiency. A pericardial effusion is notpresent.    ECG Findingson 05/03/16: sinus rhythm, left anterior fascicular block and inferior MI, low voltage. As compared to hisprevious ECG on 11/1/17there is nono significant change.     Right heart catheterizationon 05/07/16 (prior to milrinone) showed a RA pressure of 8 mmHg, RV pressure of 24/8 mmHg, PA pressure of 29/9/16 mmHg, and a PW pressure of 13 mmHg. PA saturation was 45.8%. Cardiac output by Fick calculation was 1.9 L/min with a corresponding index of 1.2 L/min/m2. Cardiac output by thermodilution was 1.6 L/min with a corresponding index of 0.95 L/min/m2. PVR is 1.6 WU.    Patient Active Problem List    Diagnosis Date Noted   . NICM (nonischemic cardiomyopathy) 06/04/2016   . Chronic systolic heart failure 1286/48/4720 . Cardiac amyloidosis 05/06/2016   . Chronic renal failure, stage 3 (moderate) 05/04/2016   . Hyperkalemia 05/03/2016   . Light chain (AL) amyloidosis    . Bilateral lower extremity edema    . Elevated troponin 04/21/2014   . Chronic combined systolic and diastolic heart failure 1072/18/2883 . Asthma without status asthmaticus    . Primary localized osteoarthrosis, shoulder region 06/18/2013   . Ganglion cyst of wrist, right 12/21/2012     Past Surgical History:   Procedure Laterality Date   . ARTHROPLASTY, SHOULDER, TOTAL  06/18/2013    Procedure: ARTHROPLASTY, SHOULDER, TOTAL;  Surgeon: NaWendi SnipesMD;  Location: [facility];  Service: Orthopedics;  Laterality: Left;   . COLONOSCOPY     . EXCISION, GANGLION  12/19/2012    Procedure: EXCISION, GANGLION;  Surgeon: McJillyn HiddenMD;  Location: [facility];   Service: Orthopedics;  Laterality: Right;  injection right and left shoulder   . HERNIA REPAIR      lt inguinal age [age] . INJECTION, MEDICATION  12/19/2012    Procedure: INJECTION, MEDICATION;  Surgeon: McJillyn HiddenMD;  Location: [facility];  Service: Orthopedics;  Laterality: Bilateral;     Current Outpatient Prescriptions   Medication Sig Dispense Refill   . aspirin EC 81 MG EC tablet Take 81 mg by mouth daily.     . bumetanide (BUMEX) 2 MG tablet Take 2 tablets (4 mg total) by mouth 2 (two) times daily. (Patient taking differently: Take 6 mg by mouth 2 (two) times daily.    ) 540 tablet 3   . metOLazone (ZAROXOLYN) 5 MG tablet Take 5 mg by mouth as needed (PRN Sun, Tues Thus if weight >135).     . milrinone (PRIMACOR) 20-5 MG/100ML-% infusion Infuse 23.0625 mcg/min into the vein continuous.     . Marland Kitchenpironolactone (ALDACTONE) 25 MG tablet Take 1 tablet (25 mg total) by  mouth daily. 90 tablet 3   . dexamethasone (DECADRON) 4 MG tablet Take 5 tablets (20 mg total) by mouth once a week.     [name] HYDROcodone-acetaminophen (NORCO) 5-325 MG per tablet Take 1 tablet by mouth every 6 (six) hours as needed for Pain.for up to 10 doses 10 tablet 0   . Lenalidomide (REVLIMID) 5 MG Cap Take 5 mg by mouth every other day.Days 1-21. Hold for 7 days. For Amyloidosis (Patient taking differently: Take 2.5 mg by mouth daily.Days 1-21 scheduled to start on 05/07/16. Hold for 7 days. For Amyloidosis    )     . potassium chloride (K-DUR,KLOR-CON) 20 MEQ tablet Take 2 tablets (40 mEq total) by mouth daily. 60 tablet 11   . predniSONE (DELTASONE) 5 MG tablet Take 2 tablets (10 mg total) by mouth every morning with breakfast. 9 tablet 0   . prochlorperazine (COMPAZINE) 10 MG tablet Take 10 mg by mouth every 6 (six) hours as needed (Nausea).       No current facility-administered medications for this visit.      No Known Allergies    Family History:  Reviewed and updated in the patient's EMR.    Social History:  Reviewed and updated in  the patient's EMR.    ROS is as described above.  A full, detailed ROS was performed today and is otherwise negative.    Vital Signs:  BP (!) 88/55   Pulse (!) 114   Temp 97.5 F (36.4 C)   Wt 69.4 kg (153 lb)   BMI 24.69 kg/m .  Estimated body mass index is 24.69 kg/m as calculated from the following:    Height as of 05/23/16: 1.676 m (_0 ).    Weight as of this encounter: 69.4 kg (153 lb).   Wt Readings from Last 3 Encounters:   06/04/16 69.4 kg (153 lb)   05/28/16 66.6 kg (146 lb 14.4 oz)   05/23/16 63 kg (139 lb)      BP Readings from Last 3 Encounters:   06/04/16 (!) 88/55   05/28/16 98/62   05/24/16 128/86        Physical Exam:  General:  alert, appears stated age, cooperative, fatigued and no distress  HEENT:  conjunctiva pale, bucal mucosa moist without streaking or exudate.  JVD to earlobe when sitting upright. No adenopathy, no carotid bruit and supple, symmetrical, trachea midline. Carotid pulsations are diminished bilaterally.  Lungs:  clear to auscultation bilaterally with normal rate and effort  Cardiovascular:  prominent apical impulse, regular rate and rhythm, S1, S2 normal and no S3 or S4 . Right chest wall single lumen central line intact with biopatch present and clean occlusive dressing.   Abdominal:  normal findings: bowel sounds normal and soft, non-tender and abnormal findings:  ascites and distended.   Hepatojular reflux is present.  Extremities:  edema 4+ pitting bilateral lower extremity edema extending up to the thighs. Bilateral compression stockings in place.  Extremities are cool to touch.    Neuromuscular exam:  Grossly non-focal though not formally tested.  Normal muscle tone.     Laboratories  06/03/2016:  Glucose 118  BUN 77  Cr 3.91  Na 133  K 3.1  Mag 2.2    05/31/2016:  Glucose 103  BUN 69  Cr 3.0  Na 131  K 3.9    Assessment and Plan:    Cardiomyopathy: In summary, Mr. Kumpf has a stage D NICM in the setting of AL amyloidosis and  is on palliative milrinone 0.375  mcg/kg/min. He is currently NYHA functional class IV.  The patient is decompensated and volume overloaded on exam. He appears to be in a low output state and his labs show increasing end organ damage. He is failing to diurese at home via oral diuretics most likely due to his increasing abdominal edema leading to decreased absorption. At this point, our options for successful outpatient diuresis are limited. I have recommended that Mr. Wert is admitted to hospital today for diuresis and further optimization. I would consider increasing his milrinone to help increase diuresis and to palliate his symtpoms. He would also benefit from an oncology consult for further amyloid treatment and a palliative care consult for symptom management. We should continue to educate him about heart healthy lifestyle. Overall [facility]'s trajectory and short term prognosis are worrisome.    The patient declined admission today because he would like to organize some things at home, but he is okay with being directly admitted tomorrow. I will arrange for an admission tomorrow (12/20). I instructed him to go to the ED overnight if there are any acute changes in his condition or symptom progression.     In the meantime, I have asked him to increase his bumex to 8 mg BID and to continue taking metolazone 5 mg daily. He is to increase his spironolactone to 50 mg daily. I have also added K-dur 40 meq daily due to his hypokalemia. He will remain off guideline directed medical therapy due to his renal failure and hypotension.      Plan of care as stated above was discussed and decided with Dr. May. Dr. May present briefly during the appointment to answer the patient's questions.     Electrophysiology:  Marqual does not have a device.    Metabolic review:  MMI1V on 03/30/16 was 5.5%.  Lipid panel on 03/30/16: total 156, triglycerides 94, LDL 113, and HDL 24.    Renal Function:  [name] renal function on 06/03/16 was:  BUN of 77, creatinine of  3.91, and eGFR of 17.  Electrolytes were not normal.  Serum Na was 133.     Gout: Patient is having an acute gout flare due to diuresis. Recommended he takes Tylenol PRN for pain. Will defer gout treatment and further pain management until he is admitted.     As always, it is a pleasure to participate in the care of Mr. Mihalik, and if you have any questions or concerns, please feel free to contact us at [phone].    [name], FNP-BC  Advanced Heart Failure/ Heart Transplant

## 2016-06-04 NOTE — Patient Instructions (Addendum)
Changes for Today:  1. INCREASE your Bumex dose to 8 mg TWO times daily. Continue taking Metolazone 5 mg daily 30 minutes prior to your morning dose of Bumex.  2. INCREASE your Spironolactone to 50 mg daily.   3. START taking K-Dur 40 meq daily.   4. Take Tylenol as needed for pain.   5. We will arrange for a direct admission tomorrow. I will call you tomorrow morning. Please go to the ED if your symptoms progress or change overnight.     Living with Heart Failure Information:  1. Follow a sodium restricted diet. Eat no more than 2000 milligrams of sodium per day.  Read labels of packaged foods if you are not familiar with the sodium content per serving.   2. Follow a 64 ounce fluid restriction. This is approximately equal to 2 liters, or 2000 cc's of fluid. Count all of the sources of fluid in your diet: drinks, fruits, vegetables, soups, etc.  3. Weigh yourself daily.  4. Report to our nurses a change in your weight of 2 pounds or more in one day, or 4 pounds or more in one week.  Call if you experience a significant change in your heart failure symptoms.  5. Anticipate refills for your medications.   6. Maintain regular exercise. Try to exercise at a level that is comfortable for you for 30 minutes, 4 to 7 times per week.   7. Remain up to date with your vaccinations. Obtain a flu shot annually during flu season. Obtain a pneumococcal vaccination at least once prior to the age of 29, and repeat this again once you pass the age of 8 if it has been 5 years since your last vaccination.  8. Keep track of your appointments with our clinic and with your other doctors. Please call us if you think that you will miss your appointment with [place].  9. Phone numbers:   - Front Desk: [phone]   - [name], NP: [phone]   - Joan Flores, RN: [phone]   - Stana Bunting, NP: [phone]   - Anette Riedel, RN [phone]

## 2016-06-04 NOTE — Progress Notes (Signed)
Spoke to pt. And his spouse. Pt. Was not feeling good this morning, felt he had a lot of fluid on him. Pt. Saw Dr. Lu Duffel at [facility] on 05/28/16 and was put back on Metolazone 2.44m to try to reduce pt's weight to a goal weight of 132-135. Pt's weight yesterday was 145lbs. Pt's HH Aide also though pt's blood pressure was low 2 days ago. Pt. Is seeing Dr. May today 05/05/16 at 11:00AM. Pt. Also must establish himself with an Oncologist, but did not have time to talk about this today. CM will contact pt. Post appt. To check on any changes.    NFrederik Schmidt MHolland MWhiteface T [phone] F [phone]

## 2016-06-05 ENCOUNTER — Inpatient Hospital Stay: Payer: Medicare Other | Admitting: Internal Medicine

## 2016-06-05 ENCOUNTER — Inpatient Hospital Stay
Admission: AD | Admit: 2016-06-05 | Discharge: 2016-06-13 | DRG: 291 | Disposition: A | Discharge status: Home Health | Payer: Medicare Other | Source: Ambulatory Visit | Attending: Internal Medicine | Admitting: Internal Medicine

## 2016-06-05 ENCOUNTER — Encounter: Payer: Self-pay | Admitting: Registered Nurse

## 2016-06-05 DIAGNOSIS — E854 Organ-limited amyloidosis: Secondary | ICD-10-CM | POA: Diagnosis present

## 2016-06-05 DIAGNOSIS — I130 Hypertensive heart and chronic kidney disease with heart failure and stage 1 through stage 4 chronic kidney disease, or unspecified chronic kidney disease: Principal | ICD-10-CM | POA: Diagnosis present

## 2016-06-05 DIAGNOSIS — M1A9XX0 Chronic gout, unspecified, without tophus (tophi): Secondary | ICD-10-CM | POA: Diagnosis present

## 2016-06-05 DIAGNOSIS — I43 Cardiomyopathy in diseases classified elsewhere: Secondary | ICD-10-CM | POA: Diagnosis present

## 2016-06-05 DIAGNOSIS — N179 Acute kidney failure, unspecified: Secondary | ICD-10-CM | POA: Diagnosis present

## 2016-06-05 DIAGNOSIS — I959 Hypotension, unspecified: Secondary | ICD-10-CM | POA: Diagnosis not present

## 2016-06-05 DIAGNOSIS — R600 Localized edema: Secondary | ICD-10-CM | POA: Diagnosis present

## 2016-06-05 DIAGNOSIS — I5022 Chronic systolic (congestive) heart failure: Secondary | ICD-10-CM

## 2016-06-05 DIAGNOSIS — D696 Thrombocytopenia, unspecified: Secondary | ICD-10-CM | POA: Diagnosis not present

## 2016-06-05 DIAGNOSIS — Z515 Encounter for palliative care: Secondary | ICD-10-CM | POA: Diagnosis not present

## 2016-06-05 DIAGNOSIS — N183 Chronic kidney disease, stage 3 unspecified: Secondary | ICD-10-CM | POA: Diagnosis present

## 2016-06-05 DIAGNOSIS — Z7982 Long term (current) use of aspirin: Secondary | ICD-10-CM

## 2016-06-05 DIAGNOSIS — Z96612 Presence of left artificial shoulder joint: Secondary | ICD-10-CM | POA: Diagnosis present

## 2016-06-05 DIAGNOSIS — E876 Hypokalemia: Secondary | ICD-10-CM | POA: Diagnosis not present

## 2016-06-05 DIAGNOSIS — E871 Hypo-osmolality and hyponatremia: Secondary | ICD-10-CM | POA: Diagnosis not present

## 2016-06-05 DIAGNOSIS — I5043 Acute on chronic combined systolic (congestive) and diastolic (congestive) heart failure: Secondary | ICD-10-CM | POA: Diagnosis present

## 2016-06-05 DIAGNOSIS — E8581 Light chain (AL) amyloidosis: Secondary | ICD-10-CM | POA: Diagnosis present

## 2016-06-05 DIAGNOSIS — J45909 Unspecified asthma, uncomplicated: Secondary | ICD-10-CM | POA: Diagnosis present

## 2016-06-05 DIAGNOSIS — I428 Other cardiomyopathies: Secondary | ICD-10-CM

## 2016-06-05 DIAGNOSIS — M1990 Unspecified osteoarthritis, unspecified site: Secondary | ICD-10-CM | POA: Diagnosis present

## 2016-06-05 LAB — CBC
Absolute NRBC: 0 10*3/uL
Hematocrit: 35.8 % — ABNORMAL LOW (ref 42.0–52.0)
Hgb: 12 g/dL — ABNORMAL LOW (ref 13.0–17.0)
MCH: 32.7 pg — ABNORMAL HIGH (ref 28.0–32.0)
MCHC: 33.5 g/dL (ref 32.0–36.0)
MCV: 97.5 fL (ref 80.0–100.0)
MPV: 10.5 fL (ref 9.4–12.3)
Nucleated RBC: 0 /100 WBC (ref 0.0–1.0)
Platelets: 145 10*3/uL (ref 140–400)
RBC: 3.67 10*6/uL — ABNORMAL LOW (ref 4.70–6.00)
RDW: 16 % — ABNORMAL HIGH (ref 12–15)
WBC: 5.89 10*3/uL (ref 3.50–10.80)

## 2016-06-05 LAB — COMPREHENSIVE METABOLIC PANEL
ALT: 19 U/L (ref 0–55)
AST (SGOT): 35 U/L — ABNORMAL HIGH (ref 5–34)
Albumin/Globulin Ratio: 1 (ref 0.9–2.2)
Albumin: 3.4 g/dL — ABNORMAL LOW (ref 3.5–5.0)
Alkaline Phosphatase: 155 U/L — ABNORMAL HIGH (ref 38–106)
BUN: 88 mg/dL — ABNORMAL HIGH (ref 9.0–28.0)
Bilirubin, Total: 3.6 mg/dL — ABNORMAL HIGH (ref 0.2–1.2)
CO2: 24 mEq/L (ref 22–29)
Calcium: 9.9 mg/dL (ref 8.5–10.5)
Chloride: 89 mEq/L — ABNORMAL LOW (ref 100–111)
Creatinine: 3.9 mg/dL — ABNORMAL HIGH (ref 0.7–1.3)
Globulin: 3.4 g/dL (ref 2.0–3.6)
Glucose: 153 mg/dL — ABNORMAL HIGH (ref 70–100)
Potassium: 4 mEq/L (ref 3.5–5.1)
Protein, Total: 6.8 g/dL (ref 6.0–8.3)
Sodium: 131 mEq/L — ABNORMAL LOW (ref 136–145)

## 2016-06-05 LAB — GFR: EGFR: 18.7

## 2016-06-05 LAB — B-TYPE NATRIURETIC PEPTIDE: B-Natriuretic Peptide: 4994 pg/mL — ABNORMAL HIGH (ref 0–100)

## 2016-06-05 MED ORDER — ONDANSETRON 4 MG PO TBDP
4.0000 mg | ORAL_TABLET | Freq: Four times a day (QID) | ORAL | Status: DC | PRN
Start: 2016-06-05 — End: 2016-06-13

## 2016-06-05 MED ORDER — NALOXONE HCL 0.4 MG/ML IJ SOLN (WRAP)
0.2000 mg | INTRAMUSCULAR | Status: DC | PRN
Start: 2016-06-05 — End: 2016-06-13

## 2016-06-05 MED ORDER — SPIRONOLACTONE 25 MG PO TABS
50.0000 mg | ORAL_TABLET | Freq: Every day | ORAL | Status: DC
Start: 2016-06-05 — End: 2016-06-13
  Administered 2016-06-08 – 2016-06-13 (×6): 50 mg via ORAL
  Filled 2016-06-05 (×7): qty 2

## 2016-06-05 MED ORDER — ONDANSETRON HCL 4 MG/2ML IJ SOLN
4.0000 mg | Freq: Four times a day (QID) | INTRAMUSCULAR | Status: DC | PRN
Start: 2016-06-05 — End: 2016-06-13

## 2016-06-05 MED ORDER — PROCHLORPERAZINE MALEATE 5 MG PO TABS
10.0000 mg | ORAL_TABLET | Freq: Four times a day (QID) | ORAL | Status: DC | PRN
Start: 2016-06-05 — End: 2016-06-13
  Filled 2016-06-05 (×4): qty 2

## 2016-06-05 MED ORDER — ASPIRIN 81 MG PO TBEC
81.0000 mg | DELAYED_RELEASE_TABLET | Freq: Every day | ORAL | Status: DC
Start: 2016-06-05 — End: 2016-06-13
  Administered 2016-06-05 – 2016-06-13 (×9): 81 mg via ORAL
  Filled 2016-06-05 (×9): qty 1

## 2016-06-05 MED ORDER — PREDNISONE 5 MG PO TABS
10.0000 mg | ORAL_TABLET | Freq: Every morning | ORAL | Status: AC
Start: 2016-06-05 — End: 2016-06-07
  Administered 2016-06-05 – 2016-06-07 (×3): 10 mg via ORAL
  Filled 2016-06-05 (×3): qty 2

## 2016-06-05 MED ORDER — MIDODRINE HCL 5 MG PO TABS
5.0000 mg | ORAL_TABLET | Freq: Three times a day (TID) | ORAL | Status: DC
Start: 2016-06-05 — End: 2016-06-06
  Administered 2016-06-05 – 2016-06-06 (×2): 5 mg via ORAL
  Filled 2016-06-05 (×2): qty 1

## 2016-06-05 MED ORDER — HEPARIN SODIUM (PORCINE) 5000 UNIT/ML IJ SOLN
5000.0000 [IU] | Freq: Three times a day (TID) | INTRAMUSCULAR | Status: DC
Start: 2016-06-05 — End: 2016-06-13
  Administered 2016-06-05 – 2016-06-13 (×25): 5000 [IU] via SUBCUTANEOUS
  Filled 2016-06-05 (×8): qty 1
  Filled 2016-06-05: qty 2
  Filled 2016-06-05 (×14): qty 1

## 2016-06-05 MED ORDER — MILRINONE LACTATE IN DEXTROSE 20-5 MG/100ML-% IV SOLN
0.3750 ug/kg/min | INTRAVENOUS | Status: DC
Start: 2016-06-05 — End: 2016-06-13
  Administered 2016-06-05 – 2016-06-13 (×17): 0.375 ug/kg/min via INTRAVENOUS
  Filled 2016-06-05 (×17): qty 100

## 2016-06-05 MED ORDER — DEXTROSE 5 % IV SOLN
1.0000 mg/h | INTRAVENOUS | Status: DC
Start: 2016-06-05 — End: 2016-06-12
  Administered 2016-06-05 – 2016-06-06 (×2): 0.5 mg/h via INTRAVENOUS
  Administered 2016-06-07: 1 mg/h via INTRAVENOUS
  Administered 2016-06-07: 0.5 mg/h via INTRAVENOUS
  Administered 2016-06-08 – 2016-06-10 (×6): 1 mg/h via INTRAVENOUS
  Administered 2016-06-10: 0.5 mg/h via INTRAVENOUS
  Administered 2016-06-10 – 2016-06-12 (×4): 1 mg/h via INTRAVENOUS
  Filled 2016-06-05 (×30): qty 40

## 2016-06-05 NOTE — H&P (Signed)
ADMISSION HISTORY AND PHYSICAL EXAM    Date Time: 06/05/16 2:38 PM  Patient Name: Gregory [name]  Attending Physician: Leonia Reader, *  Primary Care Physician: Zoe Lan, MD    CC: fatigue, weight gain      Assessment:   Principal Problem:    NICM (nonischemic cardiomyopathy)  Active Problems:    Chronic combined systolic and diastolic heart failure    Chronic renal failure, stage 3 (moderate)      Pt is a 70 yo male with PMH of amyloidosis complicated by NICM (EF 33-82%) on palliative milrinone gtt, CKD 4(baseline SCr 2.9), HTN and asthma direct admit from Advanced Heart Failure clinic for IV diuresis in setting of increased fatigue and weight gain despite increasing PO diuretics (Bumex 40m bid, Metolazone 535mand Aldactone 5024m    Plan:   #Acute on chronic systolic/diastolic CHF with nonischemic amyloid cardiomyopathy, EF 20-25% on palliative home milirone  Admit to inpatient cardiac tele  Advanced Heart Failure team aware (Dr DesShearon StallsPalliative consult  Heme/Onc consult  Cont Milrinone gtt 0.375m23mg/min  Start Bumex gtt 0.5mg/30m Heart failure team to titrate   Cont Aldactone 50mg 50my  Hold Metolazone   Strict I&Os, daily weights, fluid restrict  CMP, CBC and BNP pending    #AKI/CKD (baseline SCr 2.9)  Renal consult (Dr ManohaWyatt Hastend BMP    #Gout flare in setting of diuresis   Pain control   Hold off Prednisone pending Renal recs    Full Code  DVT ppx heparin     Disposition:     Today's date: 06/05/2016  Admit Date: 06/05/2016 11:36 AM  Anticipated medical stability for discharge:Red - not tomorrow - estimated month/date: tbd  Service status: Inpatient: risk of morbidity and mortality and risk of progressive disease  Reason for ongoing hospitalization: Acute/chronic CHF, NICM, Bumex gtt  Anticipated discharge needs: tbd    History of Presenting Illness:   Gregory [name] y.o56male  with PMH of amyloidosis complicated by NICM (EF 20-25%50-53%alliative milrinone gtt,  CKD 4(baseline SCr 2.9), HTN and asthma direct admit from Advanced Heart Failure clinic for IV diuresis in setting of increased fatigue and weight gain despite increasing PO diuretics (Bumex 8mg bi49mMetolazone 5mg and62mdactone 50mg). P16mports increased fatigue over the past week with weight gain and was recommended for admission while at his Heart failure appointment yesterday. He denies chest pain, lightheadedness, N/V/D, abd pain,fever/chills. He reports that he is feeling somewhat better today and feels that his LE edema has improved.     Past Medical History:     Past Medical History:   Diagnosis Date   . Arthritis     bilat shoulders, lt knee   . Asthma without status asthmaticus     childhood   . Cardiac amyloidosis    . CHF (congestive heart failure)    . Fracture of unspecified bones     1974 lt shoulder   . Gout    . Malignant neoplasm        Available old records reviewed, including:  ep    Past Surgical History:     Past Surgical History:   Procedure Laterality Date   . ARTHROPLASTY, SHOULDER, TOTAL  06/18/2013    Procedure: ARTHROPLASTY, SHOULDER, TOTAL;  Surgeon: Nagda, SaWendi Snipescation: [facility];  Service: Orthopedics;  Laterality: Left;   . COLONOSCOPY     . EXCISION, GANGLION  12/19/2012  Procedure: EXCISION, GANGLION;  Surgeon: Jillyn Hidden, MD;  Location: [facility];  Service: Orthopedics;  Laterality: Right;  injection right and left shoulder   . HERNIA REPAIR      lt inguinal age 29   . INJECTION, MEDICATION  12/19/2012    Procedure: INJECTION, MEDICATION;  Surgeon: Jillyn Hidden, MD;  Location: [facility];  Service: Orthopedics;  Laterality: Bilateral;       Family History:   .fam    Social History:     History   Smoking Status   . Never Smoker   Smokeless Tobacco   . Never Used     History   Alcohol Use   . Yes     Comment: rare     History   Drug Use No       Allergies:   No Known Allergies    Medications:     Current Discharge Medication List      CONTINUE these  medications which have NOT CHANGED    Details   aspirin EC 81 MG EC tablet Take 81 mg by mouth daily.      bumetanide (BUMEX) 2 MG tablet Take 2 tablets (4 mg total) by mouth 2 (two) times daily.  Qty: 540 tablet, Refills: 3    Associated Diagnoses: Non-ischemic cardiomyopathy; Chronic systolic heart failure      HYDROcodone-acetaminophen (NORCO) 5-325 MG per tablet Take 1 tablet by mouth every 6 (six) hours as needed for Pain.for up to 10 doses  Qty: 10 tablet, Refills: 0      metOLazone (ZAROXOLYN) 5 MG tablet Take 5 mg by mouth daily.          milrinone (PRIMACOR) 20-5 MG/100ML-% infusion Infuse 23.0625 mcg/min into the vein continuous.      potassium chloride (K-DUR,KLOR-CON) 20 MEQ tablet Take 2 tablets (40 mEq total) by mouth daily.  Qty: 60 tablet, Refills: 11      prochlorperazine (COMPAZINE) 10 MG tablet Take 10 mg by mouth every 6 (six) hours as needed (Nausea).      spironolactone (ALDACTONE) 25 MG tablet Take 1 tablet (25 mg total) by mouth daily.  Qty: 90 tablet, Refills: 3    Associated Diagnoses: Non-ischemic cardiomyopathy; Chronic systolic heart failure               Method by which medications were confirmed on admission: pt and family at bedside    Review of Systems:   All other systems were reviewed and are negative per HPI    Physical Exam:     Patient Vitals for the past 24 hrs:   BP Temp Temp src Pulse Resp SpO2 Height Weight   06/05/16 1200 (!) 84/60 97.4 F (36.3 C) Oral (!) 117 16 92 % 1.651 m (_0 ) 65.3 kg (144 lb)     Body mass index is 23.96 kg/m.  No intake or output data in the 24 hours ending 06/05/16 1438    General: awake, alert, oriented x 3; no acute distress.  HEENT: perrla, eomi, sclera anicteric  oropharynx clear without lesions, mucous membranes moist  Neck: supple, no lymphadenopathy, no thyromegaly, + JVD, no carotid bruits  Cardiovascular: regular rate and rhythm, no murmurs, rubs or gallops  Lungs: clear to auscultation bilaterally, without wheezing, rhonchi, or  rales  Abdomen: soft, non-tender, non-distended; no palpable masses, no hepatosplenomegaly, normoactive bowel sounds, no rebound or guarding  Extremities: BLE 3+ pitting edema to thighs  Neuro: cranial nerves grossly intact, no focal  deficit    Skin: no rashes or lesions noted        Labs:     Results     Procedure Component Value Units Date/Time    Comprehensive metabolic panel [[id number]] Collected:  06/05/16 1432    Specimen:  Blood Updated:  06/05/16 1432    CBC without differential [[id number]] Collected:  06/05/16 1432    Specimen:  Blood from Blood Updated:  06/05/16 1432    B-type Natriuretic Peptide [[id number]] Collected:  06/05/16 1432    Specimen:  Blood Updated:  06/05/16 1432          Imaging personally reviewed, including:   Xr Chest Ap Portable    Result Date: 05/08/2016   Interval placement of right PICC line with the tip overlying SVC. No visible right pneumothorax. Golden Hurter, MD 05/08/2016 2:55 PM     Picc Line Check/change    Result Date: 05/25/2016   Technically successful placement of a single lumen Power Line via the right internal jugular vein as described above. Dimitrios  Papadouris, MD 05/25/2016 10:30 PM       Safety Checklist  DVT prophylaxis:  CHEST guideline (See page e199S) Chemical and Mechanical   Foley:  [place] Rn Foley protocol Not present   IVs:  Peripheral IV   PT/OT: Not needed   Daily CBC & or Chem ordered:  SHM/ABIM guidelines (see #5) Yes, due to clinical and lab instability   Reference for approximate charges of common labs: CBC auto diff - $76  BMP - $99  Mg - $79    Signed by: Valene Bors, PA-C  cc:[name], [name], MD

## 2016-06-05 NOTE — Consults (Signed)
Consultation    Date Time: 06/05/16 8:11 PM  Patient Name: Gregory [name], Gregory [name]  Requesting Physician: Tonye Royalty, MD       Reason for Consultation:   Consulted by Dr. Merrie Roof for [facility] management    PMh/Sh./ Fh reviewed from the chart and patient        History:   Gregory [name] is a 70 y.o. male who presents to the hospital on 06/05/2016 with worsening of his leg swelling. Patient was recently admitted to [facility] for hyperkalemia. Patient states he noticed that his leg swelling was getting worse. He thinks his weight was increasing. No complaints of PND, shortness of breath but patient uses a wedge pillow at night to sleep. No complaints of chest pain, palpitations. Patient does complain of pain in his left hand related to his gout. He states his urine output has decreased until this AM.     Renal history: Creatinine at [facility] was around 2.9-3, making him probably stage IV CKD from amyloidosis. Discharge creatinine was 2.5 in 04/2016.      Past Medical History:     Past Medical History:   Diagnosis Date   . Arthritis     bilat shoulders, lt knee   . Asthma without status asthmaticus     childhood   . Cardiac amyloidosis    . CHF (congestive heart failure)    . Fracture of unspecified bones     1974 lt shoulder   . Gout    . Malignant neoplasm        Past Surgical History:     Past Surgical History:   Procedure Laterality Date   . ARTHROPLASTY, SHOULDER, TOTAL  06/18/2013    Procedure: ARTHROPLASTY, SHOULDER, TOTAL;  Surgeon: Wendi Snipes, MD;  Location: [facility];  Service: Orthopedics;  Laterality: Left;   . COLONOSCOPY     . EXCISION, GANGLION  12/19/2012    Procedure: EXCISION, GANGLION;  Surgeon: Jillyn Hidden, MD;  Location: [facility];  Service: Orthopedics;  Laterality: Right;  injection right and left shoulder   . HERNIA REPAIR      lt inguinal age 21   . INJECTION, MEDICATION  12/19/2012    Procedure: INJECTION, MEDICATION;  Surgeon: Jillyn Hidden, MD;  Location: [facility];   Service: Orthopedics;  Laterality: Bilateral;       Family History:     No history of kidney disease    Social History:     Social History     Social History   . Marital status: Married     Spouse name: N/A   . Number of children: N/A   . Years of education: N/A     Social History Main Topics   . Smoking status: Never Smoker   . Smokeless tobacco: Never Used   . Alcohol use Yes      Comment: rare   . Drug use: No   . Sexual activity: No     Other Topics Concern   . Not on file     Social History Narrative   . No narrative on file       Allergies:   No Known Allergies    Medications:     Current Facility-Administered Medications   Medication Dose Route Frequency   . aspirin EC  81 mg Oral Daily   . heparin (porcine)  5,000 Units Subcutaneous Q8H [place]   . midodrine  5 mg Oral TID MEALS   .  predniSONE  10 mg Oral QAM W/BREAKFAST   . spironolactone  50 mg Oral Daily         . bumetanide (BUMEX) infusion 0.5 mg/hr (06/05/16 1542)   . milrinone 0.375 mcg/kg/min (06/05/16 1543)       Prior to Admission medications    Medication Sig Start Date End Date Taking? Authorizing Provider   aspirin EC 81 MG EC tablet Take 81 mg by mouth daily.   Yes [provider]   bumetanide (BUMEX) 2 MG tablet Take 2 tablets (4 mg total) by mouth 2 (two) times daily.  Patient taking differently: Take 8 mg by mouth 2 (two) times daily.     05/21/16  Yes May, Jacklynn Bue, MD   HYDROcodone-acetaminophen [facility]) 5-325 MG per tablet Take 1 tablet by mouth every 6 (six) hours as needed for Pain.for up to 10 doses 04/29/16  Yes Sendi, Antionette Poles, MD   metOLazone (ZAROXOLYN) 5 MG tablet Take 5 mg by mouth daily.       Yes [provider]   milrinone (PRIMACOR) 20-5 MG/100ML-% infusion Infuse 23.0625 mcg/min into the vein continuous. 05/13/16  Yes Tirrell, Ashley Mariner, MD   potassium chloride (K-DUR,KLOR-CON) 20 MEQ tablet Take 2 tablets (40 mEq total) by mouth daily. 06/04/16 06/04/17 Yes [name], FNP      prochlorperazine (COMPAZINE) 10 MG tablet Take 10 mg by mouth every 6 (six) hours as needed (Nausea).   Yes [provider]   spironolactone (ALDACTONE) 25 MG tablet Take 1 tablet (25 mg total) by mouth daily.  Patient taking differently: Take 50 mg by mouth daily.     05/21/16  Yes May, Jacklynn Bue, MD   dexamethasone (DECADRON) 4 MG tablet Take 5 tablets (20 mg total) by mouth once a week. 05/13/16 06/05/16  Dolphus Jenny, MD   Lenalidomide (REVLIMID) 5 MG Cap Take 5 mg by mouth every other day.Days 1-21. Hold for 7 days. For Amyloidosis  Patient taking differently: Take 2.5 mg by mouth daily.Days 1-21 scheduled to start on 05/07/16. Hold for 7 days. For Amyloidosis     04/18/16 06/05/16  Marilynne Halsted, MD   predniSONE (DELTASONE) 5 MG tablet Take 2 tablets (10 mg total) by mouth every morning with breakfast. 05/13/16 06/05/16  Tirrell, Ashley Mariner, MD        Review of Systems:   A comprehensive review of systems was: General ROS: positive for  - fatigue  negative for - chills, fever or weight loss  ENT ROS: negative for - sore throat  Gastrointestinal ROS: no abdominal pain, change in bowel habits, or black or bloody stools  Genito-Urinary ROS: no dysuria, trouble voiding, or hematuria  Musculoskeletal ROS: negative for - joint pain or muscle pain  Neurological ROS: negative for - dizziness or headaches  Dermatological ROS: negative for pruritus and rash  Rest of review of systems are negative    Physical Exam:   Patient Vitals for the past 24 hrs:   BP Temp Temp src Pulse Resp SpO2 Height Weight   06/05/16 1952 106/70 97.5 F (36.4 C) Oral (!) 118 18 96 % - -   06/05/16 1534 92/59 - - (!) 113 17 92 % - -   06/05/16 1200 (!) 84/60 97.4 F (36.3 C) Oral (!) 117 16 92 % 1.651 m (_0 ) 65.3 kg (144 lb)       Intake and Output Summary (Last 24 hours) at Date Time    Intake/Output Summary (Last 24  hours) at 06/05/16 2011  Last data filed at 06/05/16 2000   Gross per 24 hour   Intake                0  ml   Output              100 ml   Net             -100 ml       No intake/output data recorded.    General appearance - alert, well appearing, and in no distress  Mental status - alert, oriented to person, place, and time  Mouth - mucous membranes moist, pharynx normal without lesions  Neck - supple, no significant adenopathy and elevated JVP  Chest - scattered wheezing, air entry equal on both sides  Heart - S1 and S2 normal  Abdomen - soft, not distended, no tenderness  Neurological - alert, oriented, normal speech, no focal findings or movement disorder noted  Extremities - pedal edema 3 +, no clubbing or cyanosis    Labs Reviewed:     Recent Labs  Lab 06/05/16  1432   WBC 5.89   RBC 3.67*   Hgb 12.0*   Hematocrit 35.8*   MCV 97.5   MCHC 33.5   RDW 16*   MPV 10.5   Platelets 145         Recent Labs  Lab 06/05/16  1432   Sodium 131*   Potassium 4.0   Chloride 89*   CO2 24   BUN 88.0*   Creatinine 3.9*   Glucose 153*   Calcium 9.9                     Recent Labs  Lab 06/05/16  1432   ALT 19   AST (SGOT) 35*   Bilirubin, Total 3.6*   Albumin 3.4*   Alkaline Phosphatase 155*                           Rads:   Radiological Procedure reviewed.     Signed by: [name], MD      Assessment/ Plan:   1) Renal: AKI on CKD, possible from cardio-renal syndrome or progression of amyloidosis. I agree with bumex drip.     2) Hypotension: I would start him on midodrine.       [name], MD  [facility]  Spectra# [phone]  Pg# [phone]  Office # [phone]  After 430 PM, please call the office for person on call  On Weekends, call the office for the person on call          Thank you very much for your consult. Will follow patient closely. Please feel free to contact with any question.       CC:  [name], [name], MD

## 2016-06-05 NOTE — Plan of Care (Signed)
Tele: Sinus Tachycardia     ASSESSMENT: Patient is A&Ox4, Ra, patient denies pain, 1 assist OOB, but prefers to use urinal. Patient has a PICC line on right side of the chest.   Purposeful rounding, non-skid socks on, bed alarm on, floor mats present, call bell within reach, patient education on safety provided.    PLAN:  I&O, Dw, 1.5L Fluid restriction, continue Milrinone gtt, continue Bumex gtt.  Palliative consult, followed by heart failure transplant team.     BRADEN: 20    FALLS: HIGH

## 2016-06-05 NOTE — Progress Notes (Signed)
Patient assigned to room 359. Report giving to accepting physician, Dr. Donata Clay, and HF physician, Dr. Shearon Stalls. Patient notified of room assignment and he will be arriving between 1030-11 am. Report called to floor RN, Cloyde Reams.

## 2016-06-05 NOTE — Consults (Addendum)
HEMATOLOGY ONCOLOGY CONSULTATION  NOTE  [facility] [place]   [phone]        Date Time: 06/05/16 3:53 PM  Patient Name: Gregory [name]  Requesting Physician: Leonia Reader, *      Reason for Consultation:   Amyloidosis involving the heart    Assessment:   Amyloidosis, involving the heart, ejection fraction in the range of 20-25 percent.    Recommendations:   We are in the process of requesting records from Dr. Karen Kays at [facility].  Once confirmed that he has AL-amyloidosis, we will review additional records and coordinate his treatment plan with the heart failure team.  Unfortunately, it appears he has significant cardiac dysfunction which correlates with poor prognosis.    It is possible thalidomide could be substituted for Revlimid, the former medication can be given at more predictable doses in the setting of renal insufficiency.  If he is having significant problems with fluid retention and hyperglycemia with steroids, dexamethasone could be avoided.  Prognosis is guarded.    History:   Gregory [name] is a 70 y.o. male who presents to the hospital on 06/05/2016 with A 2-1/2 year diagnosis of amyloidosis, initially made at the [facility].  At that time the patient was being taken care of by Dr. cross, and he believes his initial chemotherapy may have included Velcade.  Initial workup included right heart catheterization and biopsy.  Pathology is unavailable here.  Patient and his wife do not recall whether or not he had a bone marrow biopsy.    He transferred his care to Dr. Celedonio Miyamoto [facility] and more recently was under the care of Dr. Karen Kays.  In October he received a 4 week course of Revlimid 5 mg daily with dexamethasone.  The treatment was complicated by fluid retention.  He was admitted to continue his milrinone and begin intravenous Bumex for fluid retention.    The glucose is 153 BUN 88.0 creatinine 3.9.  The albumin 3.4.  The  bilirubin 3.6.  The white count 5.8 hemoglobin 12.0 hematocrit 35.8 platelets 145.  On November 22, and the C-reactive protein was 0.6.    Past Medical History:     Past Medical History:   Diagnosis Date   . Arthritis     bilat shoulders, lt knee   . Asthma without status asthmaticus     childhood   . Cardiac amyloidosis    . CHF (congestive heart failure)    . Fracture of unspecified bones     1974 lt shoulder   . Gout    . Malignant neoplasm        Past Surgical History:     Past Surgical History:   Procedure Laterality Date   . ARTHROPLASTY, SHOULDER, TOTAL  06/18/2013    Procedure: ARTHROPLASTY, SHOULDER, TOTAL;  Surgeon: Wendi Snipes, MD;  Location: [facility];  Service: Orthopedics;  Laterality: Left;   . COLONOSCOPY     . EXCISION, GANGLION  12/19/2012    Procedure: EXCISION, GANGLION;  Surgeon: Jillyn Hidden, MD;  [facility]: [facility];  Service: Orthopedics;  Laterality: Right;  injection right and left shoulder   . HERNIA REPAIR      lt inguinal age 89   . INJECTION, MEDICATION  12/19/2012    Procedure: INJECTION, MEDICATION;  Surgeon: Jillyn Hidden, MD;  Location: [facility];  Service: Orthopedics;  Laterality: Bilateral;       Family  History:     Family History   Problem Relation Age of Onset   . Family history unknown: Yes       Social History:     Social History     Social History   . Marital status: Married     Spouse name: N/A   . Number of children: N/A   . Years of education: N/A     Social History Main Topics   . Smoking status: Never Smoker   . Smokeless tobacco: Never Used   . Alcohol use Yes      Comment: rare   . Drug use: No   . Sexual activity: No     Other Topics Concern   . Not on file     Social History Narrative   . No narrative on file       Allergies:   No Known Allergies    Medications:     Current Facility-Administered Medications   Medication Dose Route Frequency   . aspirin EC  81 mg Oral Daily   . heparin (porcine)  5,000 Units Subcutaneous Q8H [place]   . midodrine  5  mg Oral TID MEALS   . predniSONE  10 mg Oral QAM W/BREAKFAST   . spironolactone  50 mg Oral Daily       Review of Systems:   A twelve system review of systems was negative except for    Physical Exam:     Vitals:    06/05/16 1534   BP: 92/59   Pulse: (!) 113   Resp: 17   Temp:    SpO2: 92%     General condition: no acute distress  Lymph nodes: No significant lymphadenopathy  Chest: Clear to percussion and auscultation  Heart: Distant S1-S2, regular tachycardia  Abdomen: No organomegaly, mass, distention or tenderness  Extremities: 3 + lower extremity edema  Skin: No rash  Neurologic: Alert, oriented, strength grossly normal    Labs Reviewed:     Results     Procedure Component Value Units Date/Time    B-type Natriuretic Peptide [[id number]]  (Abnormal) Collected:  06/05/16 1432    Specimen:  Blood Updated:  06/05/16 1522     B-Natriuretic Peptide 4,994 (H) pg/mL     Comprehensive metabolic panel [[id number]]  (Abnormal) Collected:  06/05/16 1432    Specimen:  Blood Updated:  06/05/16 1518     Glucose 153 (H) mg/dL      BUN 88.0 (H) mg/dL      Creatinine 3.9 (H) mg/dL      Sodium 131 (L) mEq/L      Potassium 4.0 mEq/L      Chloride 89 (L) mEq/L      CO2 24 mEq/L      Calcium 9.9 mg/dL      Protein, Total 6.8 g/dL      Albumin 3.4 (L) g/dL      AST (SGOT) 35 (H) U/L      ALT 19 U/L      Alkaline Phosphatase 155 (H) U/L      Bilirubin, Total 3.6 (H) mg/dL      Globulin 3.4 g/dL      Albumin/Globulin Ratio 1.0    GFR [[id number]] Collected:  06/05/16 1432     Updated:  06/05/16 1518     EGFR 18.7    CBC without differential [[id number]]  (Abnormal) Collected:  06/05/16 1432    Specimen:  Blood from Blood Updated:  06/05/16 1459  WBC 5.89 x10 3/uL      Hgb 12.0 (L) g/dL      Hematocrit 35.8 (L) %      Platelets 145 x10 3/uL      RBC 3.67 (L) x10 6/uL      MCV 97.5 fL      MCH 32.7 (H) pg      MCHC 33.5 g/dL      RDW 16 (H) %      MPV 10.5 fL      Nucleated RBC 0.0 /100 WBC      Absolute NRBC 0.00 x10 3/uL                      Recent Labs      06/05/16   1432   WBC  5.89   Hgb  12.0*   Hematocrit  35.8*   Platelets  145   MCV  97.5       Recent Labs      06/05/16   1432   Sodium  131*   Potassium  4.0   Chloride  89*   CO2  24   BUN  88.0*   Creatinine  3.9*   Glucose  153*   Calcium  9.9       Recent Labs      06/05/16   1432   AST (SGOT)  35*   ALT  19   Alkaline Phosphatase  155*   Protein, Total  6.8   Albumin  3.4*       No results for input(s): PTT, PT, INR in the last 72 hours.      Imaging Data:     Radiology Results (24 Hour)     ** No results found for the last 24 hours. **          Signed by:  [name], MD  [facility]  [phone]

## 2016-06-06 ENCOUNTER — Encounter: Payer: Self-pay | Admitting: Registered Nurse

## 2016-06-06 DIAGNOSIS — E859 Amyloidosis, unspecified: Secondary | ICD-10-CM

## 2016-06-06 DIAGNOSIS — N179 Acute kidney failure, unspecified: Secondary | ICD-10-CM

## 2016-06-06 DIAGNOSIS — E871 Hypo-osmolality and hyponatremia: Secondary | ICD-10-CM

## 2016-06-06 DIAGNOSIS — Z515 Encounter for palliative care: Secondary | ICD-10-CM

## 2016-06-06 DIAGNOSIS — I959 Hypotension, unspecified: Secondary | ICD-10-CM

## 2016-06-06 DIAGNOSIS — I509 Heart failure, unspecified: Secondary | ICD-10-CM

## 2016-06-06 DIAGNOSIS — N183 Chronic kidney disease, stage 3 (moderate): Secondary | ICD-10-CM

## 2016-06-06 DIAGNOSIS — N189 Chronic kidney disease, unspecified: Secondary | ICD-10-CM

## 2016-06-06 DIAGNOSIS — I5042 Chronic combined systolic (congestive) and diastolic (congestive) heart failure: Secondary | ICD-10-CM

## 2016-06-06 LAB — BASIC METABOLIC PANEL
BUN: 90 mg/dL — ABNORMAL HIGH (ref 9.0–28.0)
CO2: 23 mEq/L (ref 22–29)
Calcium: 9.1 mg/dL (ref 8.5–10.5)
Chloride: 90 mEq/L — ABNORMAL LOW (ref 100–111)
Creatinine: 3.7 mg/dL — ABNORMAL HIGH (ref 0.7–1.3)
Glucose: 141 mg/dL — ABNORMAL HIGH (ref 70–100)
Potassium: 3.7 mEq/L (ref 3.5–5.1)
Sodium: 130 mEq/L — ABNORMAL LOW (ref 136–145)

## 2016-06-06 LAB — CBC
Absolute NRBC: 0 10*3/uL
Hematocrit: 33.1 % — ABNORMAL LOW (ref 42.0–52.0)
Hgb: 11.3 g/dL — ABNORMAL LOW (ref 13.0–17.0)
MCH: 33.2 pg — ABNORMAL HIGH (ref 28.0–32.0)
MCHC: 34.1 g/dL (ref 32.0–36.0)
MCV: 97.4 fL (ref 80.0–100.0)
MPV: 10.7 fL (ref 9.4–12.3)
Nucleated RBC: 0 /100 WBC (ref 0.0–1.0)
Platelets: 143 10*3/uL (ref 140–400)
RBC: 3.4 10*6/uL — ABNORMAL LOW (ref 4.70–6.00)
RDW: 15 % (ref 12–15)
WBC: 5.54 10*3/uL (ref 3.50–10.80)

## 2016-06-06 LAB — GFR: EGFR: 19.8

## 2016-06-06 MED ORDER — FAMOTIDINE 20 MG PO TABS
20.0000 mg | ORAL_TABLET | ORAL | Status: DC
Start: 2016-06-06 — End: 2016-06-13
  Administered 2016-06-06 – 2016-06-13 (×8): 20 mg via ORAL
  Filled 2016-06-06 (×8): qty 1

## 2016-06-06 MED ORDER — FAMOTIDINE 20 MG PO TABS
20.0000 mg | ORAL_TABLET | Freq: Two times a day (BID) | ORAL | Status: DC
Start: 2016-06-06 — End: 2016-06-06

## 2016-06-06 MED ORDER — MIDODRINE HCL 5 MG PO TABS
10.0000 mg | ORAL_TABLET | Freq: Three times a day (TID) | ORAL | Status: DC
Start: 2016-06-06 — End: 2016-06-13
  Administered 2016-06-06 – 2016-06-13 (×21): 10 mg via ORAL
  Filled 2016-06-06 (×21): qty 2

## 2016-06-06 NOTE — Progress Notes (Signed)
Pt. Re-admitted to [facility] for CHF on 06/05/16.    [name], [place], [place]  T [phone]  F [phone]

## 2016-06-06 NOTE — Plan of Care (Addendum)
Pt transferred from [facility] to [place], received report from [employer], [employer]). Pt escorted to unit nurses via bed. Pt A+O x 4. Pain 0/10, with PRN medications available for pain management regimen. Milrinone continues at 0.375 mcg/kg/min & Bumex at 0.50m/hr. VSS. NVI. Pt ambulates with no assistance; encouraged to call for all needs. Orientation to unit, call bell within reach, bed in low position. Will continue to monitor.        Problem: Heart Failure  Goal: Mobility/Activity is maintained at optimal level for patient  Outcome: Progressing   06/06/16 1703   Goal/Interventions addressed this shift   Mobility/activity is maintained at optimal level for patient Increase mobility as tolerated/progressive mobility;Encourage independent activity per ability;Maintain proper body alignment       Problem: Safety  Goal: Patient will be free from injury during hospitalization  Outcome: Progressing   06/06/16 1703   Goal/Interventions addressed this shift   Patient will be free from injury during hospitalization  Assess patient's risk for falls and implement fall prevention plan of care per policy;Hourly rounding;Provide and maintain safe environment     Goal: Patient will be free from infection during hospitalization  Outcome: Progressing   06/06/16 1703   Goal/Interventions addressed this shift   Free from Infection during hospitalization Assess and monitor for signs and symptoms of infection;Monitor all insertion sites (i.e. indwelling lines, tubes, urinary catheters, and drains);Encourage patient and family to use good hand hygiene technique       Problem: Pain  Goal: Pain at adequate level as identified by patient  Outcome: Progressing   06/06/16 1703   Goal/Interventions addressed this shift   Pain at adequate level as identified by patient Identify patient comfort function goal;Reassess pain within 30-60 minutes of any procedure/intervention, per Pain Assessment, Intervention, Reassessment (AIR) Cycle;Evaluate if patient  comfort function goal is met       Problem: Moderate/High Fall Risk Score >5  Goal: Patient will remain free of falls  Outcome: Progressing   06/06/16 1703   OTHER   Moderate Risk (6-13) LOW-Fall Interventions Appropriate for Low Fall Risk

## 2016-06-06 NOTE — Progress Notes (Signed)
HEMATOLOGY ONCOLOGY PROGRESS NOTE  [facility] [place]   [phone]        Date Time: 06/06/16 1:35 PM  Patient Name: Gregory [name]    Principal Problem:    NICM (nonischemic cardiomyopathy)  Active Problems:    Chronic combined systolic and diastolic heart failure    Chronic renal failure, stage 3 (moderate)      HISTORY:    1 Gregory [name] is a 70 year old seen in consultation yesterday for history of cardiac amyloidosis.  Today reporting that he is feeling well.  He continues on intravenous diuretics for fluid retention in the setting of congestive heart failure.  He is receiving Bumex infusion along with milrinone.         PHYSICAL EXAM:     Vitals:    06/06/16 0837   BP: (!) 75/67   Pulse:    Resp:    Temp:    SpO2:        Intake and Output Summary (Last 24 hours) at Date Time    Intake/Output Summary (Last 24 hours) at 06/06/16 1335  Last data filed at 06/06/16 2263   Gross per 24 hour   Intake           625.77 ml   Output              650 ml   Net           -24.23 ml     Appearance: NAD  Skin: no rash  HEENT: pharynx clear  Lungs: CTAP  Heart: RRR,S1S2  Abdomen: flat, soft, nontender, without masses  Extremities: 2-3+ edema  Neuro: Alert, appropriate    ASSESSMENT/PLAN:   1.  History of amyloidosis involving the heart with nonischemic cardiomyopathy.  Previously treated with Revlimid and possibly intolerant of dexamethasone with fluid retention.  We will coordinate follow-up with me after discharge.  [name] wife is facilitating obtaining records from [facility].  The patient will be transferring his care locally to follow-up with me here in [place].  We will otherwise sign off for now.         [name], MD  [facility]  [phone]    MEDS:   Scheduled Meds:  Current Facility-Administered Medications   Medication Dose Route Frequency   . aspirin EC  81 mg Oral Daily   . famotidine  20 mg Oral Q24H [place]   . heparin (porcine)  5,000 Units Subcutaneous Q8H  [place]   . midodrine  10 mg Oral TID MEALS   . predniSONE  10 mg Oral QAM W/BREAKFAST   . spironolactone  50 mg Oral Daily     Continuous Infusions:  . bumetanide (BUMEX) infusion 0.5 mg/hr (06/06/16 0937)   . milrinone 0.375 mcg/kg/min (06/06/16 0400)     PRN Meds:.naloxone, ondansetron **OR** ondansetron, prochlorperazine     Results     Procedure Component Value Units Date/Time    CBC without differential [[id number]]  (Abnormal) Collected:  06/06/16 0353     Updated:  06/06/16 0613     WBC 5.54 x10 3/uL      Hgb 11.3 (L) g/dL      Hematocrit 33.1 (L) %      Platelets 143 x10 3/uL      RBC 3.40 (L) x10 6/uL      MCV 97.4 fL      MCH 33.2 (H) pg      MCHC 34.1 g/dL  RDW 15 %      MPV 10.7 fL      Nucleated RBC 0.0 /100 WBC      Absolute NRBC 0.00 x10 3/uL     Basic Metabolic Panel [[id number]]  (Abnormal) Collected:  06/06/16 0353     Updated:  06/06/16 0444     Glucose 141 (H) mg/dL      BUN 90.0 (H) mg/dL      Creatinine 3.7 (H) mg/dL      Calcium 9.1 mg/dL      Sodium 130 (L) mEq/L      Potassium 3.7 mEq/L      Chloride 90 (L) mEq/L      CO2 23 mEq/L     GFR [[id number]] Collected:  06/06/16 0353     Updated:  06/06/16 0444     EGFR 19.8    B-type Natriuretic Peptide [[id number]]  (Abnormal) Collected:  06/05/16 1432    Specimen:  Blood Updated:  06/05/16 1522     B-Natriuretic Peptide 4,994 (H) pg/mL     Comprehensive metabolic panel [[id number]]  (Abnormal) Collected:  06/05/16 1432    Specimen:  Blood Updated:  06/05/16 1518     Glucose 153 (H) mg/dL      BUN 88.0 (H) mg/dL      Creatinine 3.9 (H) mg/dL      Sodium 131 (L) mEq/L      Potassium 4.0 mEq/L      Chloride 89 (L) mEq/L      CO2 24 mEq/L      Calcium 9.9 mg/dL      Protein, Total 6.8 g/dL      Albumin 3.4 (L) g/dL      AST (SGOT) 35 (H) U/L      ALT 19 U/L      Alkaline Phosphatase 155 (H) U/L      Bilirubin, Total 3.6 (H) mg/dL      Globulin 3.4 g/dL      Albumin/Globulin Ratio 1.0    GFR [[id number]] Collected:  06/05/16 1432     Updated:  06/05/16  1518     EGFR 18.7    CBC without differential [[id number]]  (Abnormal) Collected:  06/05/16 1432    Specimen:  Blood from Blood Updated:  06/05/16 1459     WBC 5.89 x10 3/uL      Hgb 12.0 (L) g/dL      Hematocrit 35.8 (L) %      Platelets 145 x10 3/uL      RBC 3.67 (L) x10 6/uL      MCV 97.5 fL      MCH 32.7 (H) pg      MCHC 33.5 g/dL      RDW 16 (H) %      MPV 10.5 fL      Nucleated RBC 0.0 /100 WBC      Absolute NRBC 0.00 x10 3/uL

## 2016-06-06 NOTE — Progress Notes (Signed)
Advanced Heart Failure and Transplant Progress Note    Heart Failure/Transplant Spectra Link [phone]    Craddock a stage D NICM in the setting of AL amyloidosis which was diagnosed in December 2015. He responded to CyBorD (January - August 2016) though relapsed (increased serum free light chains from 35 in October 2017 to 378 in October 2017) and was started on revlimid 5 mg QOD on 04/09/16. I met the patient when he was hospitalized at [facility] 11/17-11/27/17, during which he was started on milrinone 0.375 mcg/kg/min. He has been under the care of physicians at [facility], though is planning on receiving care locally from this point on. [name] last seen on 12/12/17and returns to the clinic as previously scheduled.     Assessment:    Mr.Smithhas a stage D NICM - cardiac amyloidosis in the setting of AL amyloidosis and is on palliative milrinone 0.375 mcg/kg/min. He is currently NYHA functional class IV.  The patient is decompensated and volume overloaded on exam. He appears to be in a low output state and his labs show increasing end organ damage. He is failing to diurese at home via oral diuretics most likely due to his increasing abdominal edema leading to decreased absorption. At this point, our options for successful outpatient diuresis are limited.  He needs midodrine in order to avoid hypotension. He is failing despite IV milrinone.  He previously has Revlimid at [facility].      Recommendations:     IV diuretics   IV milrinone   Appreciate oncology input and offer to follow up   Needs palliative care consultation in order to maintain a quality of life out of the hospital      ---------------------------------------------------------------------------------------------------------  Subjective:  Swollen and with shortness of breath. Gout pain    Objective:    Vital signs in last 24 hours:   Temp:  [97.3 F (36.3 C)-97.6 F (36.4 C)] 97.6 F (36.4 C)  Heart Rate:  [113-119] 119  Resp  Rate:  [16-18] 16  BP: (75-106)/(59-70) 105/64  SpO2: 100 %O2 Device: None (Room air)  Height: 165.1 cm (_0 )  Weight: 65.8 kg (145 lb); Weight change:   Body mass index is 24.13 kg/m.[name]    Telemetry: no significant events    I/O last 3 completed shifts:  In: 625.77 [P.O.:450; I.V.:175.77]  Out: 650 [Urine:650]                       Drips:  . bumetanide (BUMEX) infusion 0.5 mg/hr (06/06/16 0937)   . milrinone 0.375 mcg/kg/min (06/06/16 0400)     Physical Exam:   General: well-developed, alert, NAD  HEENT:  Anicteric sclera, JVP ~ elevated, no carotid bruits, carotids 2+ bilaterally  Lungs: CTA B/L, no rhonchi, wheezes or crackles with normal rate and effort  Cardiovascular: s1s2, normal rate, regular rhythm, no m/r/g, PMI laterally displaced  Abdominal: soft, NT/ND, +B.S., no HSM  Extremities: warm to touch, no cyanosis, +1 edema, peripheral pulses 2+ bilaterally  Neuromuscular exam: grossly non-focal, though not formally tested    Cardiac Studies:  Echocardiogramon 11/18/17shows a left ventricular end-diastolic dimension of 50NL, concentric LVH (walls 15 mm), global HKwith a left ventricular ejection fraction of 20-25%, and grade 3diastolic dysfunction. The right ventricle is dilated with reduced systolic function. The left atrium isdilated and the right atrium isdilated. The aortic valve is trileaflet withoutevidence of stenosis; there is trivialinsufficiency. The mitral valve is morphologically normal with mildregurgitation. There is mildtricuspid regurgitation. Pulmonary systolic  pressure is estimated to be 79mHg above right atrial pressure (10-159mg). There is trivialpulmonic insufficiency. A pericardial effusion is notpresent.    ECG Findingson 05/03/16: sinus rhythm, left anterior fascicular block and inferior MI, low voltage. As compared to hisprevious ECG on 11/1/17there is nono significant change.     Right heart catheterizationon 05/07/16 (prior to milrinone)showed a  RA pressure of 8 mmHg, RV pressure of 24/8 mmHg, PA pressure of 29/9/16 mmHg, and a PW pressure of 13 mmHg. PA saturation was 45.8%. Cardiac output by Fick calculation was 1.9 L/min with a corresponding index of 1.2 L/min/m2. Cardiac output by thermodilution was 1.6 L/min with a corresponding index of 0.95 L/min/m2. PVR is 1.6 WU.      aspirin EC 81 mg Oral Daily   famotidine 20 mg Oral Q24H [place] heparin (porcine) 5,000 Units Subcutaneous Q8H [place] midodrine 10 mg Oral TID MEALS   predniSONE 10 mg Oral QAM W/BREAKFAST   spironolactone 50 mg Oral Daily     Lab Results   Component Value Date    WBC 5.54 06/06/2016    HGB 11.3 (L) 06/06/2016    PLT 143 06/06/2016    NA 130 (L) 06/06/2016    K 3.7 06/06/2016    BUN 90.0 (H) 06/06/2016    CREAT 3.7 (H) 06/06/2016    EGFR 19.8 06/06/2016    MG 1.7 05/13/2016    AST 35 (H) 06/05/2016    ALB 3.4 (L) 06/05/2016    INR 1.3 (H) 04/17/2016    BNP 4,994 (H) 06/05/2016

## 2016-06-06 NOTE — Consults (Signed)
[facility] Palliative Medicine & Comprehensive Care Consultation   Service Spectralink: [phone] Mon-Fri 9a-4p  [name] Gregory Pager: #[phone]    24/7    Date Time: 06/06/16 2:47 PM  Patient Name: Gregory [name] T  Attending physician:  Tonye Royalty, MD   Primary Care Physician: Zoe Lan, MD  Consulting Physician: Colin Rhein, MD  Consulting Service: Palliative Medicine  Consulted request from Tonye Royalty, MD  to see patient regarding: Dyspnea and Other distressing symptom: Leg swelling     Assessment & Plan    Impression:  1) Dyspnea-improved with Milrinone  2) CHF-systolic  3) Amyloidosis-cardiac  4) AKI on CKD-suspect cardiorenal syndrome as per nephrology, rule out amyloid involvement  5) jaundice-suspect congestive hepatopathy or involvement by amyloid or both  6) H/O asthma, arthritis, gout,     Recommendations:  -discussed about CHF and cardiorenal syndrome. According to his son, they already knew all that information  -patient himself is concerned about seeing too many doctors  -I explained the role of palliative care for symptom control when other medications have reached capacity  -As his dyspnea has improved with Milrinone, there is no need to consider opiates at this time. I explained the use of Dilaudid. I also discussed about accumulation of morphine in view of renal dysfunction. It is not advised. I explained to the patient and his son that we can use it in future when the cardiac failure medications reach their limits.  -patient also wanted to know about salt substitutes. I will have dietician talk to him in view of CHF and CKD. However it is clear that Dr. May had discussion with him about salt restriction.  -I showed the patient and his son the lab results and expressed my concern about jaundice worsening.    Psychosocial offered -not at this time. Patient does not even want to see too many doctors  Spiritual offered -not at this time  Goals of care: not discussed  as oncologist is trying to get information from [facility] and then have plan of care  Disposition: as per primary team, most likely home with home care and IV Milrinone     Message left for Dr. Carin Hock                                [facility] Team follow up plans: no need, will discuss with Advance heart failure team    Discussed directly with: Patient, Family: son and Attending Physician      Counseling & Coordination of care with patient greater than 50% Total Time =50 minuntes    Start time:14:00 Stop time: 14:50      [name]  Palliative Medicine   Spectra: [phone] M-F 9a-4:30p  Extend Pager: #[phone]   24/7         History of Presenting Illness   This is a 70 y.o. male admitted 06/05/2016 with leg swelling and dyspnea.    He has been diagnosed with CHF from cardiac amyloidosis for last 2 years. He has been going to [facility] for treatment of it for last 2 years. He then started to change his care to doctors in [place] due to long travel time. He has been admitted to [facility] few times. He was at home when he started to notice worsening of leg swelling. He last saw Dr. May in the office on December 12.    He was directly admitted from  advance heart failure clinic for rapid weight gain, worsening swelling, some dyspnea and increased fatigue.    He has been treated in the past with Cytoxan, Velcade and Decadron and more recently with Revlimid. He wants to change his oncology care to local doctors in [place].               Review of Systems       General: Fatigue, decreased energy, no F/C/S, no Headache, no insomnia  Eyes: no visual changes, dry eyes,   Mouth: no oral sores, lesions or thrush   Pulm: no cough, wheeze, or congestion, some dyspnea at first but not now, no pleurtic pain  Cards: no chest pain or palpitations, no orthopnea or PND,   GI: no reflux, constipation, hemorrhoids, or diarrhea / last BM, no blood in stools, no abdominal bloating, pain, weight fluctuations ++  GU:  no hematuria, dysuria, or incontinence, foley, Impotence  Skin: no rash, lesions no skin breakdown, no puritis  MS: no new muscle weakness, joint stiffness, aches, swelling  Neuro: no focal weakness, numbness, tingling, no dizziness/vertigo,   Psyche: Mood is normal/ no history of Anxiety / depression  Heme: no easy bleeding, bruising, petechiae    Past Medical and Surgical History     Past Medical History:   Diagnosis Date   . Arthritis     bilat shoulders, lt knee   . Asthma without status asthmaticus     childhood   . Cardiac amyloidosis    . CHF (congestive heart failure)    . Fracture of unspecified bones     1974 lt shoulder   . Gout    . Malignant neoplasm      Past Surgical History:   Procedure Laterality Date   . ARTHROPLASTY, SHOULDER, TOTAL  06/18/2013    Procedure: ARTHROPLASTY, SHOULDER, TOTAL;  Surgeon: Wendi Snipes, MD;  Location: [facility];  Service: Orthopedics;  Laterality: Left;   . COLONOSCOPY     . EXCISION, GANGLION  12/19/2012    Procedure: EXCISION, GANGLION;  Surgeon: Jillyn Hidden, MD;  Location: [facility];  Service: Orthopedics;  Laterality: Right;  injection right and left shoulder   . HERNIA REPAIR      lt inguinal age 37   . INJECTION, MEDICATION  12/19/2012    Procedure: INJECTION, MEDICATION;  Surgeon: Jillyn Hidden, MD;  Location: [facility];  Service: Orthopedics;  Laterality: Bilateral;       Social History     Marital Status: married  Lives with: wife, very good family support  Spirituality and Importance: not discussed  Meaning and Purpose: not discussed, he wants to know the salt substitutes    Advanced Directives:  Has: _0  Yes _1  No  Discussed: _2  Yes _3  No  Plan to discuss: _4  Yes _5  No      Emergency contact Listed: Extended Emergency Contact Information  Primary Emergency Contact: Caine,[name]  Address: [place], [place] [address] [place] of [place] Phone: [phone]  Mobile Phone: [phone]  Relation:  Spouse  Secondary Emergency Contact: [place]  Mobile Phone: [phone]  Relation: Daughter     CURRENT CPR Status:  Full Code   History   Smoking Status   . Never Smoker   Smokeless Tobacco   . Never Used     History   Alcohol Use   . Yes  Comment: rare     History   Drug Use No     Family History     Family History   Problem Relation Age of Onset   . Family history unknown: Yes       Medications     Medications:     Current Facility-Administered Medications   Medication Dose Route Frequency   . aspirin EC  81 mg Oral Daily   . famotidine  20 mg Oral Q24H [place]   . heparin (porcine)  5,000 Units Subcutaneous Q8H [place]   . midodrine  10 mg Oral TID MEALS   . predniSONE  10 mg Oral QAM W/BREAKFAST   . spironolactone  50 mg Oral Daily       naloxone, ondansetron **OR** ondansetron, prochlorperazine      Allergies     No Known Allergies    Physical Exam     BP 105/64   Pulse (!) 119   Temp 97.6 F (36.4 C)   Resp 16   Ht 1.651 m (_0 )   Wt 65.8 kg (145 lb)   SpO2 100%   BMI 24.13 kg/m     Physical Exam:  General: well developed  Cachectic; no acute distress, Temporal wasting  EYES:  eomi, sclera anicteric,   ENT: oropharynx clear without lesions or thrush, mucous membranes moist  Neck: supple, no lymphadenopathy  CV: regular rate and rhythm, no murmurs, rubs or gallops  Lungs:CTAB, breathing comfortably, no accessory muscle use, no wheeze, rales, rhonchi  Abd: soft, NT, ND, +bs, no rebound or guarding  Ext: no clubbing, cyanosis, or edema  Neuro: awake, alert, oriented x 3, strength 5/5 in upper and lower extremities, sensation intact, no myoclonus   Skin: no rashes or lesions noted  Musc: nontender to palpation over spine, no scoliosis, or kyphosis     Labs / Radiology     Lab and diagnostics: reviewed in Epic.    Recent Labs  Lab 06/06/16  0353   WBC 5.54   Hgb 11.3*   Hematocrit 33.1*   Platelets 143         Recent Labs  Lab 06/06/16  0353   Sodium 130*   Potassium 3.7    Chloride 90*   CO2 23   BUN 90.0*   Creatinine 3.7*   EGFR 19.8   Glucose 141*   Calcium 9.1         Recent Labs  Lab 06/05/16  1432   Bilirubin, Total 3.6*   Protein, Total 6.8   Albumin 3.4*   ALT 19   AST (SGOT) 35*       No results found.

## 2016-06-06 NOTE — Progress Notes (Signed)
PROGRESS NOTE    Date Time: 06/06/16 8:55 PM  Patient Name: Gregory [name],Gregory [name]                                NEPHROLOGY PROGRESS NOTE    Follow up for acute renal failure  Subjective:   Patient states his legs are still swollen    Medications:      Scheduled Meds: PRN Meds:      aspirin EC 81 mg Oral Daily   famotidine 20 mg Oral Q24H [place]   heparin (porcine) 5,000 Units Subcutaneous Q8H [place]   midodrine 10 mg Oral TID MEALS   predniSONE 10 mg Oral QAM W/BREAKFAST   spironolactone 50 mg Oral Daily       Continuous Infusions:  . bumetanide (BUMEX) infusion 0.5 mg/hr (06/06/16 0937)   . milrinone 0.375 mcg/kg/min (06/06/16 1754)      naloxone 0.2 mg PRN   ondansetron 4 mg Q6H PRN   Or     ondansetron 4 mg Q6H PRN   prochlorperazine 10 mg Q6H PRN         Review of Systems:    No complaints of chest pain, palpitations shortness of breath, cough, headaches, dizziness, joint pain, abdominal pain, nausea, decreased appetite, or fevers.     Physical Exam:   Patient Vitals for the past 24 hrs:   BP Temp Temp src Pulse Resp SpO2 Weight   06/06/16 1900 106/66 (!) 96.9 F (36.1 C) Oral 100 16 100 % -   06/06/16 1819 105/61 - - (!) 111 16 95 % -   06/06/16 1352 105/64 - - - 16 100 % -   06/06/16 0837 (!) 75/67 - - - - - -   06/06/16 0836 (!) 77/64 97.6 F (36.4 C) - (!) 119 - 96 % -   06/06/16 0700 - - - - - - 65.8 kg (145 lb)   06/06/16 0400 95/59 97.4 F (36.3 C) - - - 92 % -   06/06/16 0010 98/68 97.3 F (36.3 C) Oral (!) 113 18 92 % -       Intake and Output Summary (Last 24 hours) at Date Time    Intake/Output Summary (Last 24 hours) at 06/06/16 2055  Last data filed at 06/06/16 1600   Gross per 24 hour   Intake           625.77 ml   Output              850 ml   Net          -224.23 ml       General appearance - alert, well appearing, and in no distress  Mental status - alert, oriented to person, place, and time  Chest - clear to auscultation, no wheezes, rales or rhonchi, symmetric air entry  Heart - S1 and S2  normal  Abdomen - soft, no tenderness  Neurological - alert, normal speech  Extremities - pedal edema 4 +, no clubbing or cyanosis    Labs:     Recent Labs  Lab 06/06/16  0353 06/05/16  1432   WBC 5.54 5.89   RBC 3.40* 3.67*   Hgb 11.3* 12.0*   Hematocrit 33.1* 35.8*   MCV 97.4 97.5   MCHC 34.1 33.5   RDW 15 16*   MPV 10.7 10.5   Platelets 143 145  Recent Labs  Lab 06/06/16  0353 06/05/16  1432   Sodium 130* 131*   Potassium 3.7 4.0   Chloride 90* 89*   CO2 23 24   BUN 90.0* 88.0*   Creatinine 3.7* 3.9*   Glucose 141* 153*   Calcium 9.1 9.9         Results     Procedure Component Value Units Date/Time    CBC without differential [[id number]]  (Abnormal) Collected:  06/06/16 0353     Updated:  06/06/16 0613     WBC 5.54 x10 3/uL      Hgb 11.3 (L) g/dL      Hematocrit 33.1 (L) %      Platelets 143 x10 3/uL      RBC 3.40 (L) x10 6/uL      MCV 97.4 fL      MCH 33.2 (H) pg      MCHC 34.1 g/dL      RDW 15 %      MPV 10.7 fL      Nucleated RBC 0.0 /100 WBC      Absolute NRBC 0.00 x10 3/uL     Basic Metabolic Panel [[id number]]  (Abnormal) Collected:  06/06/16 0353     Updated:  06/06/16 0444     Glucose 141 (H) mg/dL      BUN 90.0 (H) mg/dL      Creatinine 3.7 (H) mg/dL      Calcium 9.1 mg/dL      Sodium 130 (L) mEq/L      Potassium 3.7 mEq/L      Chloride 90 (L) mEq/L      CO2 23 mEq/L     GFR [[id number]] Collected:  06/06/16 0353     Updated:  06/06/16 0444     EGFR 19.8                Rads:   Radiological Procedure reviewed.    Signed by: [name], MD    Assessment:   1) Renal: AKI, related to cardio-renal syndrome. Continue bumex drip and aldactone    2) Hypotension: Continue midodrine     3) Hyponatremia: Related to volume overload. Continue bumex drip and aldactone.       Plan:   Continue bumex drip and aldactone.   Daily weights  Monitor blood pressure  Monitor renal function      [name], MD  [facility]  Spectra# [phone]  Pg# [phone]  Office # [phone]  After 430  PM, please call the office for person on call  On Weekends, call the office for the person on call

## 2016-06-06 NOTE — Progress Notes (Signed)
Pt transferred to [place] room 318. Report given to Ti'shambay RN and care released. Patient is stable and without questions or concerns at this time.

## 2016-06-06 NOTE — Plan of Care (Signed)
VSS on RA, SR on tele.  Milrinone and bumex gtt's infusing per order.  Pt using urinal at bedside for I&O recordings.  No complaints of pain. Edema noted to BLE.  Will cotninue to monitor.

## 2016-06-06 NOTE — Progress Notes (Signed)
Telecom w/ pt wife. Prior to admission pt was independent with ADLs and has good family support. Known to [place] [facility] and [facility] for home infusion. Pt is a 30 day readmit and Medicare Focus dx. D/C needs: tbd with 48 hr f/u.       06/06/16 1245   Healthcare Decisions   Interviewed: Spouse   Name of interviewee if other than the pt: wife Harriett   Interviewee Contact Information: [phone]   Orientation/Decision Making Abilities of Patient Alert and Oriented x3, able to make decisions   Advance Directive Patient does not have advance directive   Prior to admission   Prior level of function Independent with ADLs;Ambulates independently   Type of Residence Private residence   Home Layout Multi-level;Performs ADL's on one level   Have running water, electricity, heat, etc? Yes   Living Arrangements Spouse/significant other;Family members   How do you get to your MD appointments? wife   How do you get your groceries? wife   Who fixes your meals? self   Who does your laundry? self   Who picks up your prescriptions? wife   Dressing Independent   Grooming Independent   Feeding Independent   Bathing Independent   Toileting Independent   DME Currently at Home Other (Comment)  (none)   Home Care/Community Services TCM;Skilled nursing  (infusion)   Name of Agency [facility]   Frequency of services once per week   Adult Protective Services (APS) involved? No   Discharge Planning   Support Systems Spouse/significant other;Family members   Patient expects to be discharged to: home   Anticipated [place] plan discussed with: Same as interviewed   [place] discussion contact information: [phone]   Potential barriers to discharge: (none)   Mode of transportation: Private car (family member)   Consults/Providers   PT Evaluation Needed 1   OT Evalulation Needed 1   SLP Evaluation Needed 2   Correct PCP listed in Epic? Yes   Important Message from [facility] Notice   Patient received 1st IMM Letter? Yes   Date of most recent IMM  given: 06/05/16     Lenoria Chime, [place]  Case Mgmt Care Coordinator  [phone]

## 2016-06-06 NOTE — Progress Notes (Signed)
MEDICINE PROGRESS NOTE    Date Time: 06/06/16 8:57 AM  Patient Name: Gregory [name]  Attending Physician: Tonye Royalty, MD    Assessment:   Principal Problem:    NICM (nonischemic cardiomyopathy)  Active Problems:    Chronic combined systolic and diastolic heart failure    Chronic renal failure, stage 3 (moderate)  Resolved Problems:    * No resolved hospital problems. *       A 70 year old [place] American male with PMH of NICM w/EF 20-25% secondary to AL Amyloidosis on palliative Milrinone gtt, CKD, HTN, Asthma was admitted from [facility] for report of increased fatigue and weight gain on current home regimen requiring IV diuresis.     Plan:   - NICM in the setting of AL amyloidosis   Continue Palliative Milrinone at 0.375 mcg/kg/min  Continue Bumex gtt 0.5 mg/hr   Monitor I&O closely   Check weight daily   Replete electrolytes as necessary to keep K>4, Mg>2  [facility] team following   Continue ASA, Aldactone   Oncology following for Amyloidosis, awaiting records from [facility] to coordinate treatment plan    - asymptomatic hypotension (SBP 70s this morning)  Increase Midodrine to 10 mg TID     - AKI on CKD (baseline Cr 2.9)  Cr 3.7, down from 3.9  Nephrology following      - Acute gout flare up   Continue Prednisone 10 mg daily for 3 days     Heparin SC for DVT prophylaxis     Palliative team consulted to discuss goals of care     Case discussed with: Dr Merrie Roof, patient and RN   Safety Checklist:     DVT prophylaxis:  CHEST guideline (See page [id number]) Chemical   Foley:  [place] Rn Foley protocol Not present   IVs:  Peripheral IV   PT/OT: Not needed   Daily CBC & or Chem ordered:  SHM/ABIM guidelines (see #5) Yes, due to clinical and lab instability   Reference for approximate charges of common labs: CBC auto diff - $76  BMP - $99  Mg - $79    Lines:     Patient Lines/Drains/Airways Status    Active PICC Line / CVC Line / PIV Line / Drain / Airway / Intraosseous Line / Epidural Line / ART Line / Line /  Wound / Pressure Ulcer / NG/OG Tube     Name:   Placement date:   Placement time:   Site:   Days:    PICC Single Lumen 05/17/16 Right  05/17/16            20    Peripheral IV 06/05/16 Left Forearm  06/05/16    1449    Forearm    less than 1    Peripheral IV 06/05/16 Right Forearm  06/05/16    1435    Forearm    less than 1    Surgical Airway  05/04/16            33                 Disposition: (Please see PAF column for Expected D/C Date)   Today's date: 06/06/2016  Admit Date: 06/05/2016 11:36 AM  LOS: 1  Clinical Milestones: Bumex gtt for diuresis   Anticipated discharge needs: none       Subjective   CC: NICM (nonischemic cardiomyopathy)    Interval History/24 hour events: IV Bumex and Milrinone gtt  HPI/Subjective: patient reports his hands feels better. Still has pain L>R. No report of SOB or CP.     Review of Systems:     As per HPI    Physical Exam:     VITAL SIGNS PHYSICAL EXAM   Temp:  [97.3 F (36.3 C)-97.6 F (36.4 C)] 97.6 F (36.4 C)  Heart Rate:  [113-119] 119  Resp Rate:  [16-18] 18  BP: (75-106)/(59-70) 75/67  Blood Glucose:    Telemetry: SR      Intake/Output Summary (Last 24 hours) at 06/06/16 0857  Last data filed at 06/06/16 5830   Gross per 24 hour   Intake           625.77 ml   Output              650 ml   Net           -24.23 ml    Physical Exam  General: awake, alert X 3  Cardiovascular: regular rate and rhythm, no murmurs, rubs or gallops  Lungs: clear to auscultation bilaterally, without wheezing, rhonchi, or rales  Abdomen: soft, non-tender, non-distended; no palpable masses,  normoactive bowel sounds  Extremities: BLE pitting edema       Meds:     Medications were reviewed:  Current Facility-Administered Medications   Medication Dose Route Frequency   . aspirin EC  81 mg Oral Daily   . heparin (porcine)  5,000 Units Subcutaneous Q8H [place]   . midodrine  5 mg Oral TID MEALS   . predniSONE  10 mg Oral QAM W/BREAKFAST   . spironolactone  50 mg Oral Daily     Current  Facility-Administered Medications   Medication Dose Route Frequency Last Rate   . bumetanide (BUMEX) infusion  0.5 mg/hr Intravenous Continuous 0.5 mg/hr (06/05/16 1542)   . milrinone  0.375 mcg/kg/min Intravenous Continuous 0.375 mcg/kg/min (06/06/16 0400)     Current Facility-Administered Medications   Medication Dose Route   . naloxone  0.2 mg Intravenous   . ondansetron  4 mg Oral    Or   . ondansetron  4 mg Intravenous   . prochlorperazine  10 mg Oral         Labs:     Labs (last 72 hours):      Recent Labs  Lab 06/06/16  0353 06/05/16  1432   WBC 5.54 5.89   Hgb 11.3* 12.0*   Hematocrit 33.1* 35.8*   Platelets 143 145            Recent Labs  Lab 06/06/16  0353 06/05/16  1432   Sodium 130* 131*   Potassium 3.7 4.0   Chloride 90* 89*   CO2 23 24   BUN 90.0* 88.0*   Creatinine 3.7* 3.9*   Calcium 9.1 9.9   Albumin  --  3.4*   Protein, Total  --  6.8   Bilirubin, Total  --  3.6*   Alkaline Phosphatase  --  155*   ALT  --  19   AST (SGOT)  --  35*   Glucose 141* 153*                   Imaging, reviewed and are significant for:  Xr Chest Ap Portable    Result Date: 05/08/2016   Interval placement of right PICC line with the tip overlying SVC. No visible right pneumothorax. Golden Hurter, MD 05/08/2016 2:55 PM     Picc Line Check/change  Result Date: 05/25/2016   Technically successful placement of a single lumen Power Line via the right internal jugular vein as described above. Dimitrios  Papadouris, MD 05/25/2016 10:30 PM         Signed by: Alyson Locket, NP   Medicine Hospitalist Nurse Practitioner   Spectra#: [phone]

## 2016-06-06 NOTE — UM Notes (Signed)
FACILITY TAX ID : [id number]  NPI : [id number]    ADMIT DATE: 06/05/2016                   TODAY'S DATE: 06/06/16   TYPE OF REVIEW:  INITIAL                       STATUS :  INPT   UNIT:  TELEMETRY       Heart failure [I50.9]    CLINICAL PRESENTATION:   Age 70 year old admitted for worsening of leg swelling ,  Admitted recently for hyperkalemia to [place].  BNP 4994, Glucose 141, CHL 90 L , BUN 90 and Creatinine elevated to 3.7 .    BP down to 84/60 . PR up to 117 , Spo2 97% .     PMH:  has a past medical history of Arthritis; Asthma without status asthmaticus; Cardiac amyloidosis; CHF (congestive heart failure); Fracture of unspecified bones; Gout; and Malignant neoplasm.       Past surgical history :  has a past surgical history that includes Colonoscopy; EXCISION, GANGLION (12/19/2012); INJECTION, MEDICATION (12/19/2012); Hernia repair; and ARTHROPLASTY, SHOULDER, TOTAL (06/18/2013).      VITAL SIGNS:   Body mass index is 24.13 kg/m.   BP (!) 75/67 Comment: Pt is asymptomatic  Pulse (!) 119   Temp 97.6 F (36.4 C)   Resp 18   Ht 1.651 m (_0 )   Wt 65.8 kg (145 lb)   SpO2 96%   BMI 24.13 kg/m   PR 119       ED MEDS:       MEDS GIVEN ON UNIT :   Scheduled Meds:  Current Facility-Administered Medications   Medication Dose Route Frequency   . aspirin EC  81 mg Oral Daily   . heparin (porcine)  5,000 Units Subcutaneous Q8H [place]   . midodrine  5 mg Oral TID MEALS   . predniSONE  10 mg Oral QAM W/BREAKFAST   . spironolactone  50 mg Oral Daily     Continuous Infusions:  . bumetanide (BUMEX) infusion 0.5 mg/hr (06/05/16 1542)   . milrinone 0.375 mcg/kg/min (06/06/16 0400)     PRN Meds:.naloxone, ondansetron **OR** ondansetron, prochlorperazine      LABS:    Lab Results   Component Value Date    WBC 5.54 06/06/2016    HGB 11.3 (L) 06/06/2016    HCT 33.1 (L) 06/06/2016    MCV 97.4 06/06/2016    PLT 143 06/06/2016         PLAN :   LOS: 1 Days    Admit to cardiac telemetry for IV bumex infusion   Creatinine history :  2.9- 3 . Probably stage IV CKD .   D/C creatinine 2.5 in 04/2016.  No 3.7    Payer : Medicare A / B   Medicare focus diagnosis : YES Heart failure     DISPO:  A- 2 days     Thressa Sheller RN, BSN   Utilization Review RN  [facility]   [phone]

## 2016-06-07 LAB — BASIC METABOLIC PANEL
BUN: 94 mg/dL — ABNORMAL HIGH (ref 9.0–28.0)
CO2: 24 mEq/L (ref 22–29)
Calcium: 9.1 mg/dL (ref 8.5–10.5)
Chloride: 88 mEq/L — ABNORMAL LOW (ref 100–111)
Creatinine: 3.7 mg/dL — ABNORMAL HIGH (ref 0.7–1.3)
Glucose: 104 mg/dL — ABNORMAL HIGH (ref 70–100)
Potassium: 3.2 mEq/L — ABNORMAL LOW (ref 3.5–5.1)
Sodium: 130 mEq/L — ABNORMAL LOW (ref 136–145)

## 2016-06-07 LAB — GFR: EGFR: 19.8

## 2016-06-07 MED ORDER — POTASSIUM CHLORIDE CRYS ER 20 MEQ PO TBCR
40.0000 meq | EXTENDED_RELEASE_TABLET | Freq: Once | ORAL | Status: AC
Start: 2016-06-07 — End: 2016-06-07
  Administered 2016-06-07: 40 meq via ORAL
  Filled 2016-06-07: qty 2

## 2016-06-07 NOTE — Progress Notes (Signed)
Advanced Heart Failure and Transplant Progress Note    Heart Failure/Transplant Spectra Link [phone]    Jandreau a stage D NICM in the setting of AL amyloidosis which was diagnosed in December 2015. He responded to CyBorD (January - August 2016) though relapsed (increased serum free light chains from 35 in October 2017 to 378 in October 2017) and was started on revlimid 5 mg QOD on 04/09/16. I met the patient when he was hospitalized at [facility] 11/17-11/27/17, during which he was started on milrinone 0.375 mcg/kg/min. He has been under the care of physicians at [facility], though is planning on receiving care locally from this point on. [name] last seen on 12/12/17and returns to the clinic as previously scheduled.     Assessment:    Mr.Smithhas a stage D NICM - cardiac amyloidosis in the setting of AL amyloidosis and is on palliative milrinone 0.375 mcg/kg/min. He is currently NYHA functional class IV.  The patient is decompensated and volume overloaded on exam. He appears to be in a low output state and his labs show increasing end organ damage. He is failing to diurese at home via oral diuretics most likely due to his increasing abdominal edema leading to decreased absorption. At this point, our options for successful outpatient diuresis are limited.  He needs midodrine in order to avoid hypotension. He is failing despite IV milrinone.  He previously has Revlimid at [facility].  He has had AKI on CKD. Elevated Tbili and jandice likely from congestion. He has a poor short term prognosis due to his progressive cardiac restriction.        Recommendations:     IV diuretics to continue to keep >1liter negative per day.  0.5 mg bumex / hr is good for today   IV milrinone - 0.375 ug/kg/min   Midodrine for blood pressure is okay   Appreciate oncology input and offer to follow up   Appreciate palliative care evaluation   Try to wrap legs to decrease  edema      ---------------------------------------------------------------------------------------------------------  Subjective:  Swollen without shortness of breath. Gout pain is better    Objective:    Vital signs in last 24 hours:   Temp:  [96.9 F (36.1 C)-97.6 F (36.4 C)] 97.4 F (36.3 C)  Heart Rate:  [94-119] 97  Resp Rate:  [16] 16  BP: (75-106)/(61-67) 100/67  SpO2: 96 %O2 Device: None (Room air)  Height: 165.1 cm (_0 )  Weight: 65.8 kg (145 lb); Weight change:   Body mass index is 24.13 kg/m.[name]    Telemetry: no significant events    I/O last 3 completed shifts:  In: 625.77 [P.O.:450; I.V.:175.77]  Out: 1450 [Urine:1450]                       Drips:  . bumetanide (BUMEX) infusion 0.5 mg/hr (06/07/16 0507)   . milrinone 0.375 mcg/kg/min (06/07/16 0506)     Physical Exam:   General: well-developed, alert, NAD  HEENT:  Anicteric sclera, JVP ~ elevated, no carotid bruits, carotids 2+ bilaterally  Lungs: CTA B/L, no rhonchi, wheezes or crackles with normal rate and effort  Cardiovascular: s1s2, normal rate, regular rhythm, no m/r/g, PMI laterally displaced  Abdominal: soft, NT/ND, +B.S., no HSM  Extremities: warm to touch, no cyanosis, +1 edema, peripheral pulses 2+ bilaterally  Neuromuscular exam: grossly non-focal, though not formally tested    Cardiac Studies:  Echocardiogramon 11/18/17shows a left ventricular end-diastolic dimension of 00SY, concentric LVH (walls 15 mm),  global HKwith a left ventricular ejection fraction of 20-25%, and grade 3diastolic dysfunction. The right ventricle is dilated with reduced systolic function. The left atrium isdilated and the right atrium isdilated. The aortic valve is trileaflet withoutevidence of stenosis; there is trivialinsufficiency. The mitral valve is morphologically normal with mildregurgitation. There is mildtricuspid regurgitation. Pulmonary systolic pressure is estimated to be 36mHg above right atrial pressure (10-1104mg). There is  trivialpulmonic insufficiency. A pericardial effusion is notpresent.    ECG Findingson 05/03/16: sinus rhythm, left anterior fascicular block and inferior MI, low voltage. As compared to hisprevious ECG on 11/1/17there is nono significant change.     Right heart catheterizationon 05/07/16 (prior to milrinone)showed a RA pressure of 8 mmHg, RV pressure of 24/8 mmHg, PA pressure of 29/9/16 mmHg, and a PW pressure of 13 mmHg. PA saturation was 45.8%. Cardiac output by Fick calculation was 1.9 L/min with a corresponding index of 1.2 L/min/m2. Cardiac output by thermodilution was 1.6 L/min with a corresponding index of 0.95 L/min/m2. PVR is 1.6 WU.      aspirin EC 81 mg Oral Daily   famotidine 20 mg Oral Q24H [place]   heparin (porcine) 5,000 Units Subcutaneous Q8H [place] midodrine 10 mg Oral TID MEALS   predniSONE 10 mg Oral QAM W/BREAKFAST   spironolactone 50 mg Oral Daily     Recent Labs      06/06/16   0353  06/05/16   1432   WBC  5.54  5.89   Hgb  11.3*  12.0*   Hematocrit  33.1*  35.8*   Platelets  143  145   MCV  97.4  97.5     Recent Labs      06/07/16   0506  06/06/16   0353   Sodium  130*  130*   Potassium  3.2*  3.7   Chloride  88*  90*   CO2  24  23   BUN  94.0*  90.0*   Creatinine  3.7*  3.7*   Glucose  104*  141*   Calcium  9.1  9.1     Recent Labs      06/05/16   1432   AST (SGOT)  35*   ALT  19   Alkaline Phosphatase  155*   Protein, Total  6.8   Albumin  3.4*     No results for input(s): PTT, PT, INR in the last 72 hours.  Lab Results   Component Value Date    ALT 19 06/05/2016    AST 35 (H) 06/05/2016    ALKPHOS 155 (H) 06/05/2016    BILITOTAL 3.6 (H) 06/05/2016

## 2016-06-07 NOTE — Progress Notes (Addendum)
PROGRESS NOTE    Date Time: 06/07/16 3:14 PM  Patient Name: Gregory [name],Gregory [name]                                NEPHROLOGY PROGRESS NOTE    Subjective:   Sitting at edge of bed and says he is doing ok  For fu of aki    Medications:      Scheduled Meds: PRN Meds:      aspirin EC 81 mg Oral Daily   famotidine 20 mg Oral Q24H [place]   heparin (porcine) 5,000 Units Subcutaneous Q8H [place]   midodrine 10 mg Oral TID MEALS   potassium chloride 40 mEq Oral Once   spironolactone 50 mg Oral Daily       Continuous Infusions:  . bumetanide (BUMEX) infusion 0.5 mg/hr (06/07/16 0507)   . milrinone 0.375 mcg/kg/min (06/07/16 0506)      naloxone 0.2 mg PRN   ondansetron 4 mg Q6H PRN   Or     ondansetron 4 mg Q6H PRN   prochlorperazine 10 mg Q6H PRN         Review of Systems:    No complaints of chest pain, palpitations shortness of breath, cough, headaches, dizziness, joint pain, abdominal pain, nausea, decreased appetite, or fevers.     Physical Exam:   Patient Vitals for the past 24 hrs:   BP Temp Temp src Pulse Resp SpO2 Weight   06/07/16 1024 93/52 - - (!) 102 20 98 % -   06/07/16 0959 92/62 (!) 96.7 F (35.9 C) Oral - 20 96 % -   06/07/16 0700 - - - - - - 67.4 kg (148 lb 8 oz)   06/07/16 0500 100/67 97.4 F (36.3 C) Oral 97 16 96 % -   06/06/16 2300 99/63 (!) 96.9 F (36.1 C) Oral 94 16 98 % -   06/06/16 1900 106/66 (!) 96.9 F (36.1 C) Oral 100 16 100 % -   06/06/16 1819 105/61 - - (!) 111 16 95 % -       Intake and Output Summary (Last 24 hours) at Date Time    Intake/Output Summary (Last 24 hours) at 06/07/16 1514  Last data filed at 06/07/16 0930   Gross per 24 hour   Intake              280 ml   Output              900 ml   Net             -620 ml       GEN: Well developed, no distress,  Mouth: No lesions on lips or tongue  Lungs: diffuse rales present, BS nl  Heart: RRR, no murmurs or rubs  LE: edema 4+ bilatreal with ACE wraps  Skin/mucosa: good turgor, mmm  Psych: Alert, speech and mood good    Labs:     Recent Labs  Lab  06/06/16  0353 06/05/16  1432   WBC 5.54 5.89   RBC 3.40* 3.67*   Hgb 11.3* 12.0*   Hematocrit 33.1* 35.8*   MCV 97.4 97.5   MCHC 34.1 33.5   RDW 15 16*   MPV 10.7 10.5   Platelets 143 145           Recent Labs  Lab 06/07/16  0506 06/06/16  0353 06/05/16  1432   Sodium 130* 130* 131*  Potassium 3.2* 3.7 4.0   Chloride 88* 90* 89*   CO2 _0 BUN 94.0* 90.0* 88.0*   Creatinine 3.7* 3.7* 3.9*   Glucose 104* 141* 153*   Calcium 9.1 9.1 9.9         Results     Procedure Component Value Units Date/Time    Basic Metabolic Panel [[id number]]  (Abnormal) Collected:  06/07/16 0506    Specimen:  Blood Updated:  06/07/16 0619     Glucose 104 (H) mg/dL      BUN 94.0 (H) mg/dL      Creatinine 3.7 (H) mg/dL      Calcium 9.1 mg/dL      Sodium 130 (L) mEq/L      Potassium 3.2 (L) mEq/L      Chloride 88 (L) mEq/L      CO2 24 mEq/L     GFR [[id number]] Collected:  06/07/16 0506     Updated:  06/07/16 0619     EGFR 19.8            Rads:   Radiological Procedure reviewed.    Signed by: [name], MD    Assessment:Plan     AKI: Could be cardiorenal syndrome or advanced amyloidosis.  Would avoid acei, arbs, dye studies and nsaids.  Dose meds for gfr<20    Cardiac amyloidosis with anasarca: Continue per cradiac transplant on milrinone and bumex drips.  Goal 1liter negatuve per day.     Hyppotension: Started on midodrine    Hyponatremia:Hypervolemic.  Continue diuresis as above.  May consider increasing bumex dose as patient says he is not urinating much today    Hypokalemia: Ordered KCl 40    [name], MD  [phone]

## 2016-06-07 NOTE — Plan of Care (Signed)
Patientwants to pursue treatment of amyloidosis under oncology care locally. He will see Dr. Natividad Brood in the office. He does not have any symptom issue at this time. Milrinone has helped dyspnea and there is no need to consider opiates.    We will sign off and follow PRN.

## 2016-06-07 NOTE — Plan of Care (Signed)
Problem: Heart Failure  Goal: Stable vital signs and fluid balance  Outcome: Progressing   06/07/16 0350   Goal/Interventions addressed this shift   Stable vital signs and fluid balance Monitor/assess vital signs and telemetry per unit protocol;Assess signs and symptoms associated with cardiac rhythm changes;Monitor lab values;Weigh on admission and record weight daily     Pt is A/O x 4. VSS. NSR on tele. Bumex gtt @ 5 ml/hr, milrinone @ 0.375 mcg/kg/min. No complaints. Pt denies SOB, chest pain, DOE, lightheadedness. Call bell within reach, fall mats are in place, bed alarm is on. Will continue to monitor.

## 2016-06-07 NOTE — Progress Notes (Signed)
MEDICINE PROGRESS NOTE    Date Time: 06/07/16 6:44 PM  Patient Name: Gregory [name] T  Attending Physician: Tonye Royalty, MD    Assessment:   Principal Problem:    NICM (nonischemic cardiomyopathy)  Active Problems:    Chronic combined systolic and diastolic heart failure    Chronic renal failure, stage 3 (moderate)  Resolved Problems:    * No resolved hospital problems. *    Assessment: [name] is a 45M with hx of NICM EF 25% 2/2 amyloidosis, CKD stage IV 2/2 amyloidosis home milrinone admitted directly from HF clinic for weight gain/fatigue found to have significant AKI and acute on chronic systolic heart failure exacerbation with hypotension and also with gout flare.     Plan:   # Acute on chronic systolic heart failure:  - Continue milrinone @ 0.337mg/kg/min  - Bumex drip @ 0.557mhr --> will increase to 37m56mr given poor UOP, no improvement in Cr  - Not on BB, ACEI in setting of hypotension  - Daily weights, strict I&Os, fluid restriction    # AKI on CKD (baseline 2.5 with recent Cr 2.8-3): likely cardiorenal versus renal amyloid. Modest improvement since admission. Cr stable today at 1.7.   - Continue diuresis above  - No further workup warranted at this time  - Renal following    # Amyloidosis: previous treated with Velcade (not confirmed) followed by Revlimid and dexamethasone.  - Appreciate oncology. No further inpatient workup/treatment warranted. F/u with Dr. ReiJoneen Caraway outpatient after discharge.    # Hypotension: asymptomatic. Likely 2/2 progressive HF. Improved numbers (SBP 90s-110s).  - Increased midodrine on 12/21 to 85m75mD    # Hyponatremia, hypervolemic: aggressive diuresis as above. Has been stable which would be expected given poor urine output on diuretics.     # Gout flare: completed 3 days of prednisone 85mg50mh resolution of symptoms. Will not start allopurinol given recent flare. At risk for recurrence given attempting diuresis.     GOC/Symptom mgmt: patient does currently feel  SOB. Palliative care consult appreciated, requested by advanced HF team in setting of poor prognosis, QOL after discharge, dyspnea mgmt if milrinone/diuresis fails    Safety Checklist:     DVT prophylaxis:  CHEST guideline (See page e199S) Chemical   Foley:  [place] Rn Foley protocol Not present   IVs:  Peripheral IV   PT/OT: Ordered   Daily CBC & or Chem ordered:  SHM/ABIM guidelines (see #5) Yes, due to clinical and lab instability   Reference for approximate charges of common labs: CBC auto diff - $76  BMP - $99  Mg - $79    Lines:     Patient Lines/Drains/Airways Status    Active PICC Line / CVC Line / PIV Line / Drain / Airway / Intraosseous Line / Epidural Line / ART Line / Line / Wound / Pressure Ulcer / NG/OG Tube     Name:   Placement date:   Placement time:   Site:   Days:    PICC Single Lumen 05/17/16 Right  05/17/16            21    CVC Single Lumen 06/06/16 Right Tunneled  06/06/16    1000        1    Peripheral IV 06/05/16 Left Forearm  06/05/16    1449    Forearm    2    Peripheral IV 06/05/16 Right Forearm  06/05/16    1435    Forearm  2    Surgical Airway  05/04/16            34                 Disposition: (Please see PAF column for Expected D/C Date)   Today's date: 06/07/2016  Admit Date: 06/05/2016 11:36 AM  LOS: 2  Clinical Milestones: Transition to oral Lasix  Anticipated discharge needs: HF follow up      Subjective     CC: NICM (nonischemic cardiomyopathy)    Interval History/24 hour events: No events.    HPI/Subjective: Patient has no complaints, feels well today. Indigestion resolved. Still with fluid in legs, he states. No CP, SOB. Decreased urine output compared to what he expected.      Review of Systems:     As per HPI    Physical Exam:     VITAL SIGNS PHYSICAL EXAM   Temp:  [96.7 F (35.9 C)-97.4 F (36.3 C)] 96.7 F (35.9 C)  Heart Rate:  [94-102] 102  Resp Rate:  [16-20] 20  BP: (92-116)/(52-68) 116/68        Intake/Output Summary (Last 24 hours) at 06/07/16 1844  Last data  filed at 06/07/16 1531   Gross per 24 hour   Intake              480 ml   Output              850 ml   Net             -370 ml    Physical Exam  General: awake, alert X oriented x 3  Neck: Elevated JVP  Cardiovascular: regular rate and rhythm, + S4, no rubs/murmurs  Lungs: basilar crackles (improved) otherwise CTAB  Abdomen: soft, non-tender, non-distended; no palpable masses,  normoactive bowel sounds  Extremities: 2+ LE edema         Meds:     Medications were reviewed:  Current Facility-Administered Medications   Medication Dose Route Frequency   . aspirin EC  81 mg Oral Daily   . famotidine  20 mg Oral Q24H [place]   . heparin (porcine)  5,000 Units Subcutaneous Q8H [place]   . midodrine  10 mg Oral TID MEALS   . spironolactone  50 mg Oral Daily     Current Facility-Administered Medications   Medication Dose Route Frequency Last Rate   . bumetanide (BUMEX) infusion  1 mg/hr Intravenous Continuous 0.5 mg/hr (06/07/16 0507)   . milrinone  0.375 mcg/kg/min Intravenous Continuous 0.375 mcg/kg/min (06/07/16 1816)     Current Facility-Administered Medications   Medication Dose Route   . naloxone  0.2 mg Intravenous   . ondansetron  4 mg Oral    Or   . ondansetron  4 mg Intravenous   . prochlorperazine  10 mg Oral         Labs:     Labs (last 72 hours):      Recent Labs  Lab 06/06/16  0353 06/05/16  1432   WBC 5.54 5.89   Hgb 11.3* 12.0*   Hematocrit 33.1* 35.8*   Platelets 143 145            Recent Labs  Lab 06/07/16  0506 06/06/16  0353 06/05/16  1432   Sodium 130* 130* 131*   Potassium 3.2* 3.7 4.0   Chloride 88* 90* 89*   CO2 _0 BUN 94.0* 90.0* 88.0*   Creatinine 3.7* 3.7* 3.9*  Calcium 9.1 9.1 9.9   Albumin  --   --  3.4*   Protein, Total  --   --  6.8   Bilirubin, Total  --   --  3.6*   Alkaline Phosphatase  --   --  155*   ALT  --   --  19   AST (SGOT)  --   --  35*   Glucose 104* 141* 153*                   Microbiology, reviewed and are significant for:  Microbiology Results     None          Imaging,  reviewed and are significant for:  No new imaging      Signed by: Tonye Royalty, MD

## 2016-06-07 NOTE — Plan of Care (Signed)
Problem: Heart Failure  Goal: Stable vital signs and fluid balance  Outcome: Progressing   06/07/16 1551   Goal/Interventions addressed this shift   Stable vital signs and fluid balance Monitor/assess vital signs and telemetry per unit protocol;Weigh on admission and record weight daily;Assess signs and symptoms associated with cardiac rhythm changes;Monitor intake/output per unit protocol and/or LIP order;Monitor lab values;Monitor for leg swelling/edema and report to LIP if abnormal     VS with BP 60R systolic. Tele NSR. Pt denies SOB or dizziness. OOB walking to BR and tolerating well. Severe edema to Bilateral LE. Now wrapped with ace wraps. Not much urine output. See I/O. Pt encouraged to keep legs elevated but continues to dangle at the bedside. Will continue to follow the CHF plan of care.

## 2016-06-07 NOTE — Progress Notes (Signed)
Admission Medication Reconciliation  Chief Complaint:  Fatigue, Weight Gain    Allergies:  Patient has no known allergies.    Primary Source:  Wife [name]) [phone]    Prior to Admission Medication List:  Medication Sig   . aspirin EC 81 MG EC tablet Take 81 mg by mouth daily.   . bumetanide (BUMEX) 2 MG tablet Take 8 mg by mouth 2 (two) times daily.   . metOLazone (ZAROXOLYN) 5 MG tablet Take 5 mg by mouth daily.  30 minutes prior to Bumetanide (Bumex)   . milrinone (PRIMACOR) 20-5 MG/100ML-% infusion Infuse 23.0625 mcg/min into the vein continuous.   . potassium chloride (K-DUR,KLOR-CON) 20 MEQ tablet Take 2 tablets (40 mEq total) by mouth daily.   [name] spironolactone (ALDACTONE) 25 MG tablet Take 50 mg by mouth daily.       Comments/Recommendations:    -The following medications have been removed (reason): Norco, Compazine (wife reports patient had prescriptions, but never got them filled)    Rhys Martini, PharmD  [facility]   [phone]

## 2016-06-07 NOTE — Progress Notes (Signed)
CM attempted to round with patient to answer questions, patient in deep sleep. Will follow up on next working day.    Army Chaco  Clinical Case Manager RN  [facility]  [phone]

## 2016-06-08 LAB — BASIC METABOLIC PANEL
BUN: 95 mg/dL — ABNORMAL HIGH (ref 9.0–28.0)
CO2: 24 mEq/L (ref 22–29)
Calcium: 8.9 mg/dL (ref 8.5–10.5)
Chloride: 88 mEq/L — ABNORMAL LOW (ref 100–111)
Creatinine: 3.3 mg/dL — ABNORMAL HIGH (ref 0.7–1.3)
Glucose: 176 mg/dL — ABNORMAL HIGH (ref 70–100)
Potassium: 3.3 mEq/L — ABNORMAL LOW (ref 3.5–5.1)
Sodium: 129 mEq/L — ABNORMAL LOW (ref 136–145)

## 2016-06-08 LAB — MAGNESIUM: Magnesium: 2.1 mg/dL (ref 1.6–2.6)

## 2016-06-08 LAB — GFR: EGFR: 22.6

## 2016-06-08 MED ORDER — POTASSIUM CHLORIDE CRYS ER 20 MEQ PO TBCR
40.0000 meq | EXTENDED_RELEASE_TABLET | Freq: Every day | ORAL | Status: AC
Start: 2016-06-08 — End: 2016-06-12
  Administered 2016-06-08 – 2016-06-12 (×5): 40 meq via ORAL
  Filled 2016-06-08 (×5): qty 2

## 2016-06-08 NOTE — Progress Notes (Signed)
PROGRESS NOTE    Date Time: 06/08/16 2:57 PM  Patient Name: Gregory [name],Gregory [name]                                NEPHROLOGY PROGRESS NOTE    Subjective:   Feels ok an dhad pain in left leg and thus unwrapped th eleg  For fu of aki    Medications:      Scheduled Meds: PRN Meds:      aspirin EC 81 mg Oral Daily   famotidine 20 mg Oral Q24H [place]   heparin (porcine) 5,000 Units Subcutaneous Q8H [place]   midodrine 10 mg Oral TID MEALS   potassium chloride 40 mEq Oral Daily   spironolactone 50 mg Oral Daily       Continuous Infusions:  . bumetanide (BUMEX) infusion 1 mg/hr (06/08/16 1353)   . milrinone 0.375 mcg/kg/min (06/08/16 1354)      naloxone 0.2 mg PRN   ondansetron 4 mg Q6H PRN   Or     ondansetron 4 mg Q6H PRN   prochlorperazine 10 mg Q6H PRN         Review of Systems:    No complaints of chest pain, palpitations shortness of breath, cough, headaches, dizziness, joint pain, abdominal pain, nausea, decreased appetite, or fevers.     Physical Exam:   Patient Vitals for the past 24 hrs:   BP Temp Temp src Pulse Resp SpO2 Weight   06/08/16 1141 93/56 (!) 96.4 F (35.8 C) Oral 64 20 92 % -   06/08/16 1018 107/71 - - (!) 101 - - -   06/08/16 0754 109/71 (!) 95.6 F (35.3 C) Oral 99 18 97 % -   06/08/16 0715 - - - - - - 66.7 kg (147 lb)   06/08/16 0524 102/68 97.3 F (36.3 C) Oral - 14 97 % -   06/07/16 1903 100/68 - - - 20 98 % -   06/07/16 1700 116/68 - - - 20 97 % -       Intake and Output Summary (Last 24 hours) at Date Time    Intake/Output Summary (Last 24 hours) at 06/08/16 1457  Last data filed at 06/08/16 1141   Gross per 24 hour   Intake           743.65 ml   Output             1630 ml   Net          -886.35 ml       GEN: Well developed, no distress,  Mouth: No lesions on lips or tongue  Lungs: Clear, BS nl  Heart: RRR, no murmurs or rubs  LE: edema 4+ with wraps on rle and loose wrap on lle   Skin/mucosa: good turgor, mmm  Psych: Alert, speech and mood good    Labs:     Recent Labs  Lab 06/06/16  0353 06/05/16  1432    WBC 5.54 5.89   RBC 3.40* 3.67*   Hgb 11.3* 12.0*   Hematocrit 33.1* 35.8*   MCV 97.4 97.5   MCHC 34.1 33.5   RDW 15 16*   MPV 10.7 10.5   Platelets 143 145           Recent Labs  Lab 06/08/16  0526 06/07/16  0506 06/06/16  0353 06/05/16  1432   Sodium 129* 130* 130* 131*   Potassium 3.3* 3.2* 3.7  4.0   Chloride 88* 88* 90* 89*   CO2 _0 BUN 95.0* 94.0* 90.0* 88.0*   Creatinine 3.3* 3.7* 3.7* 3.9*   Glucose 176* 104* 141* 153*   Calcium 8.9 9.1 9.1 9.9   Magnesium 2.1  --   --   --          Results     Procedure Component Value Units Date/Time    Basic Metabolic Panel [[id number]]  (Abnormal) Collected:  06/08/16 0526    Specimen:  Blood Updated:  06/08/16 0644     Glucose 176 (H) mg/dL      BUN 95.0 (H) mg/dL      Creatinine 3.3 (H) mg/dL      Calcium 8.9 mg/dL      Sodium 129 (L) mEq/L      Potassium 3.3 (L) mEq/L      Chloride 88 (L) mEq/L      CO2 24 mEq/L     GFR [[id number]] Collected:  06/08/16 0526     Updated:  06/08/16 0644     EGFR 22.6    Magnesium [[id number]] Collected:  06/08/16 0526    Specimen:  Blood Updated:  06/08/16 0644     Magnesium 2.1 mg/dL             Rads:   Radiological Procedure reviewed.    Signed by: [name], MD    Assessment:Plan     AKI: Could be cardiorenal syndrome or advanced amyloidosis.  Creatinine slighty better and expect may stabilize around 2.7.  His creatinine has been increasing slowly from 2 in June 2017 to 2.7 in Oct 2017 per [facility] results.  Would avoid acei, arbs, dye studies and nsaids.  Dose meds for gfr<20    Cardiac amyloidosis with anasarca: Continue per cradiac transplant on milrinone and bumex increased 60m/hr.  Goal 1liter negative per day thouhg doubt his Uo is completely collected    Hypotension: Started on midodrine and seems bp is better in 100s today    Hyponatremia:Hypervolemic.  Continue diuresis as above.      Hypokalemia: Ordered KCl 40 qd     PAshley Akin MD  [phone]

## 2016-06-08 NOTE — Plan of Care (Signed)
Problem: Heart Failure  Goal: Stable vital signs and fluid balance  Outcome: Progressing   06/08/16 0025   Goal/Interventions addressed this shift   Stable vital signs and fluid balance Monitor/assess vital signs and telemetry per unit protocol;Weigh on admission and record weight daily;Assess signs and symptoms associated with cardiac rhythm changes;Monitor intake/output per unit protocol and/or LIP order;Monitor for leg swelling/edema and report to LIP if abnormal;Monitor lab values     Patient alert and oriented x4. On room air. Lungs clear. No cough present. Denies chest pain. Denies dizziness. Denies lightheadedness or n/v. Able to ambulate in room independently. Pt refusing bed alarm or fall mat. Pt aware that he is on moderate fall risk and requires bed alarm at night to ensure safety. Pt refusing. Charge nurse aware. Call bell and personal belongings within reach. Hourly rounding done. Assistance offered with ADLs and provided as needed.     Vital signs stable. Afebrile. Monitoring intake/ouput as well as daily weight and labs. On Bumex drip. Monitoring swelling of lower extremities. Bilateral lower extremities with ace wrap. Will continue to monitor.

## 2016-06-08 NOTE — Progress Notes (Signed)
Dr. Warren Lacy made aware of 15 beats of V tach and low potassium level.

## 2016-06-08 NOTE — Plan of Care (Signed)
Problem: Safety  Goal: Patient will be free from injury during hospitalization  Outcome: Progressing   06/06/16 1703   Goal/Interventions addressed this shift   Patient will be free from injury during hospitalization  Assess patient's risk for falls and implement fall prevention plan of care per policy;Hourly rounding;Provide and maintain safe environment       Comments: Pt a&ox4, f/c, ambulates with SBG. NSR/ST on tele, HR 90-100s, SBP 90-110s, afebrile. SpO2 >96% on R/A. Pt c/o intermittent BLE pain with movement, refuses pain meds. Voids to urinal, urine yellow/clear. Milrinone @ 0.336mg/kg/hr via R chest tunneled cath. Bumex gtt _0 /hr via R PIV. Fall precautions in place. Hourly rounding done. Continue monitoring for any changes. Monitor VS and labs. Maintain pt safety.

## 2016-06-08 NOTE — Progress Notes (Signed)
MEDICINE PROGRESS NOTE    Date Time: 06/08/16 5:29 PM  Patient Name: Gregory [name]  Attending Physician: Clarnce Flock, MD    Assessment:   Principal Problem:    NICM (nonischemic cardiomyopathy)  Active Problems:    Chronic combined systolic and diastolic heart failure    Chronic renal failure, stage 3 (moderate)  Resolved Problems:    * No resolved hospital problems. *    Assessment: Gregory [name] is a 89M with hx of NICM EF 25% 2/2 amyloidosis, CKD stage IV 2/2 amyloidosis home milrinone admitted directly from HF clinic for weight gain/fatigue found to have significant AKI and acute on chronic systolic heart failure exacerbation with hypotension and also with gout flare.     Plan:   1 Acute on chronic systolic heart failure:  - Continue milrinone @ 0.3494mg/kg/min  - Bumex drip @ 0.565mhr --> will increase to 94m63mr given poor UOP, no improvement in Cr  - Not on BB, ACEI in setting of hypotension started on midodrine  - Daily weights, strict I&Os, fluid restriction    2 AKI on CKD (baseline 2.5 with recent Cr 2.8-3): likely cardiorenal versus renal amyloid. Modest improvement since admission. Cr stable today at 1.7.   - Continue diuresis above  - No further workup warranted at this time  - Renal following    3 Amyloidosis: previous treated with Velcade (not confirmed) followed by Revlimid and dexamethasone.  - Appreciate oncology. No further inpatient workup/treatment warranted. F/u with Dr. ReiJoneen Caraway outpatient after discharge.    4 Hypotension: asymptomatic. Likely 2/2 progressive HF. Improved numbers (SBP 90s-110s).  - Increased midodrine on 12/21 to 73m594mD    5 Hyponatremia, hypervolemic: aggressive diuresis as above. Has been stable which would be expected given poor urine output on diuretics.     6 Gout flare: completed 3 days of prednisone 73mg85mh resolution of symptoms. Will not start allopurinol given recent flare. At risk for recurrence given attempting diuresis.     GOC/Symptom mgmt: patient  does currently feel SOB. Palliative care consult appreciated, requested by advanced HF team in setting of poor prognosis, QOL after discharge, dyspnea mgmt if milrinone/diuresis fails    Safety Checklist:     DVT prophylaxis:  CHEST guideline (See page [id number]) Chemical   Foley:   Rn Foley protocol Not present   IVs:  Peripheral IV   PT/OT: Ordered   Daily CBC & or Chem ordered:  SHM/ABIM guidelines (see #5) Yes, due to clinical and lab instability   Reference for approximate charges of common labs: CBC auto diff - $76  BMP - $99  Mg - $79    Lines:     Patient Lines/Drains/Airways Status    Active PICC Line / CVC Line / PIV Line / Drain / Airway / Intraosseous Line / Epidural Line / ART Line / Line / Wound / Pressure Ulcer / NG/OG Tube     Name:   Placement date:   Placement time:   Site:   Days:    PICC Single Lumen 05/17/16 Right  05/17/16            21    CVC Single Lumen 06/06/16 Right Tunneled  06/06/16    1000        1    Peripheral IV 06/05/16 Left Forearm  06/05/16    1449    Forearm    2    Peripheral IV 06/05/16 Right Forearm  06/05/16    1435  Forearm    2    Surgical Airway  05/04/16            34                 Disposition: (Please see PAF column for Expected D/C Date)   Today's date: 06/08/2016  Admit Date: 06/05/2016 11:36 AM  LOS: 3  Clinical Milestones: Transition to oral Lasix  Anticipated discharge needs: HF follow up      Subjective     CC: NICM (nonischemic cardiomyopathy)    Interval History/24 hour events: No events.    HPI/Subjective: Patient has no complaints, feels well today. Indigestion resolved. Still with fluid in legs, he states. No cp or sob     Review of Systems:     As per HPI    Physical Exam:     VITAL SIGNS PHYSICAL EXAM   Temp:  [95.6 F (35.3 C)-97.3 F (36.3 C)] 96.7 F (35.9 C)  Heart Rate:  [64-102] 102  Resp Rate:  [14-20] 20  BP: (93-109)/(56-74) 109/74        Intake/Output Summary (Last 24 hours) at 06/08/16 1729  Last data filed at 06/08/16 1709   Gross per  24 hour   Intake           623.65 ml   Output             1780 ml   Net         -1156.35 ml    Physical Exam  General: awake, alert X oriented x 3  Neck: Elevated JVP  Cardiovascular: regular rate and rhythm, + S4, no rubs/murmurs  Lungs: basilar crackles (improved) otherwise CTAB  Abdomen: soft, non-tender, non-distended; no palpable masses,  normoactive bowel sounds  Extremities: 2+ LE edema         Meds:     Medications were reviewed:  Current Facility-Administered Medications   Medication Dose Route Frequency   . aspirin EC  81 mg Oral Daily   . famotidine  20 mg Oral Q24H [place]   . heparin (porcine)  5,000 Units Subcutaneous Q8H [place]   . midodrine  10 mg Oral TID MEALS   . potassium chloride  40 mEq Oral Daily   . spironolactone  50 mg Oral Daily     Current Facility-Administered Medications   Medication Dose Route Frequency Last Rate   . bumetanide (BUMEX) infusion  1 mg/hr Intravenous Continuous 1 mg/hr (06/08/16 1353)   . milrinone  0.375 mcg/kg/min Intravenous Continuous 0.375 mcg/kg/min (06/08/16 1354)     Current Facility-Administered Medications   Medication Dose Route   . naloxone  0.2 mg Intravenous   . ondansetron  4 mg Oral    Or   . ondansetron  4 mg Intravenous   . prochlorperazine  10 mg Oral         Labs:     Labs (last 72 hours):      Recent Labs  Lab 06/06/16  0353 06/05/16  1432   WBC 5.54 5.89   Hgb 11.3* 12.0*   Hematocrit 33.1* 35.8*   Platelets 143 145            Recent Labs  Lab 06/08/16  0526 06/07/16  0506  06/05/16  1432   Sodium 129* 130* More results in Results Review 131*   Potassium 3.3* 3.2* More results in Results Review 4.0   Chloride 88* 88* More results in Results Review 89*   CO2 24 24 More  results in Results Review 24   BUN 95.0* 94.0* More results in Results Review 88.0*   Creatinine 3.3* 3.7* More results in Results Review 3.9*   Calcium 8.9 9.1 More results in Results Review 9.9   Albumin  --   --   --  3.4*   Protein, Total  --   --   --  6.8   Bilirubin, Total  --   --    --  3.6*   Alkaline Phosphatase  --   --   --  155*   ALT  --   --   --  19   AST (SGOT)  --   --   --  35*   Glucose 176* 104* More results in Results Review 153*   More results in Results Review = values in this interval not displayed.                Microbiology, reviewed and are significant for:  Microbiology Results     None          Imaging, reviewed and are significant for:  No new imaging      Signed by: Clarnce Flock, MD

## 2016-06-08 NOTE — Plan of Care (Signed)
Problem: Heart Failure  Goal: Stable vital signs and fluid balance  Outcome: Progressing   06/08/16 2324   Goal/Interventions addressed this shift   Stable vital signs and fluid balance Monitor/assess vital signs and telemetry per unit protocol;Weigh on admission and record weight daily;Assess signs and symptoms associated with cardiac rhythm changes;Monitor intake/output per unit protocol and/or LIP order;Monitor lab values;Monitor for leg swelling/edema and report to LIP if abnormal     Patient alert and oriented x4. On roo air. Lungs clear, diminished. Shortness of breath present with activity. Denies chest pain. Denies dizziness. Monitoring intake and output, daily weight, and daily labs. Continues to have low urine output as well as swelling in bilateral lower ext. Bilateral lower extremities in ace wrap. SR or ST on tele. Will continue to monitor labs, including potassium and magnesium. On Milrinone drip as well as Bumex drip.    Pt remains free of injury. Refusing bed alarm and fall mat. On high fall risk. Hourly rounding done. Call bell and personal belongings within reach. Urgency and frequency. Pt has been educated on fall prevention and indicates understanding. Will continue to monitor.

## 2016-06-09 LAB — BASIC METABOLIC PANEL
BUN: 100 mg/dL — ABNORMAL HIGH (ref 9.0–28.0)
CO2: 27 mEq/L (ref 22–29)
Calcium: 9.2 mg/dL (ref 8.5–10.5)
Chloride: 89 mEq/L — ABNORMAL LOW (ref 100–111)
Creatinine: 3.3 mg/dL — ABNORMAL HIGH (ref 0.7–1.3)
Glucose: 126 mg/dL — ABNORMAL HIGH (ref 70–100)
Potassium: 3.4 mEq/L — ABNORMAL LOW (ref 3.5–5.1)
Sodium: 130 mEq/L — ABNORMAL LOW (ref 136–145)

## 2016-06-09 LAB — MAGNESIUM: Magnesium: 1.9 mg/dL (ref 1.6–2.6)

## 2016-06-09 LAB — GFR: EGFR: 22.6

## 2016-06-09 MED ORDER — POTASSIUM CHLORIDE CRYS ER 20 MEQ PO TBCR
40.0000 meq | EXTENDED_RELEASE_TABLET | Freq: Once | ORAL | Status: AC
Start: 2016-06-09 — End: 2016-06-09
  Administered 2016-06-09: 40 meq via ORAL
  Filled 2016-06-09: qty 2

## 2016-06-09 MED ORDER — INFLUENZA VAC SPLIT HIGH-DOSE 0.5 ML IM SUSY
0.5000 mL | PREFILLED_SYRINGE | Freq: Once | INTRAMUSCULAR | Status: DC
Start: 2016-06-10 — End: 2016-06-13
  Filled 2016-06-09: qty 0.5

## 2016-06-09 NOTE — Plan of Care (Addendum)
Problem: Safety  Goal: Patient will be free from injury during hospitalization  Outcome: Progressing  A&Ox4 pt kind and cooperative. On tele, NSR-ST. VSS. No c/o pain, dizziness, nausea, or SOB. Remains on room air. Maintained milrinone gtt _0 .375 mcg/kg/min. Maintained Bumex gtt _1 /hr. BLE ACE wraps removed by pt this AM. No questions or concerns at this time. Refusing bed alarm.     Wrapped BLE with ACE wraps from foot to below the knee, pt comfortable. Family and visitors at bedside, pt in okay spirits. I/O recorded. Pt aware of plan to continue diuresis. Fall mat in place, bed alarm refused, SCDs refused, call light and possessions within reach.

## 2016-06-09 NOTE — Progress Notes (Signed)
PROGRESS NOTE    Date Time: 06/09/16 1:12 PM  Patient Name: Gregory [name],Gregory [name]                                NEPHROLOGY PROGRESS NOTE    Subjective:   Says he does not understand where the fluid is coming from as he is not eating salt and only drinks 4-5 cups of fluid per day  For fu of aki    Medications:      Scheduled Meds: PRN Meds:      aspirin EC 81 mg Oral Daily   famotidine 20 mg Oral Q24H [place]   heparin (porcine) 5,000 Units Subcutaneous Q8H [place]   midodrine 10 mg Oral TID MEALS   potassium chloride 40 mEq Oral Daily   spironolactone 50 mg Oral Daily       Continuous Infusions:  . bumetanide (BUMEX) infusion 1 mg/hr (06/09/16 0816)   . milrinone 0.375 mcg/kg/min (06/08/16 2333)      naloxone 0.2 mg PRN   ondansetron 4 mg Q6H PRN   Or     ondansetron 4 mg Q6H PRN   prochlorperazine 10 mg Q6H PRN         Review of Systems:    No complaints of chest pain, palpitations shortness of breath, cough, headaches, dizziness, joint pain, abdominal pain, nausea, decreased appetite, or fevers.     Physical Exam:   Patient Vitals for the past 24 hrs:   BP Temp Temp src Pulse Resp SpO2 Weight   06/09/16 1146 103/59 - - (!) 105 17 97 % -   06/09/16 0720 104/69 (!) 96.6 F (35.9 C) Oral - 18 98 % 66.5 kg (146 lb 9.6 oz)   06/09/16 0500 113/76 (!) 95.4 F (35.2 C) Oral - - 96 % -   06/08/16 2237 97/59 97.6 F (36.4 C) Oral 98 19 97 % -   06/08/16 1911 93/59 97.6 F (36.4 C) Oral 100 19 98 % -   06/08/16 1706 109/74 (!) 96.7 F (35.9 C) Oral (!) 102 20 94 % -       Intake and Output Summary (Last 24 hours) at Date Time    Intake/Output Summary (Last 24 hours) at 06/09/16 1312  Last data filed at 06/09/16 0816   Gross per 24 hour   Intake           730.07 ml   Output             1420 ml   Net          -689.93 ml       GEN: Well developed, no distress,  Mouth: No lesions on lips or tongue  Lungs: Clear, BS decreased  Heart: RRR, no murmurs or rubs  LE: edema 4+ bilatreal  Skin/mucosa: good turgor, mmm  Psych: Alert, speech and  mood good    Labs:     Recent Labs  Lab 06/06/16  0353 06/05/16  1432   WBC 5.54 5.89   RBC 3.40* 3.67*   Hgb 11.3* 12.0*   Hematocrit 33.1* 35.8*   MCV 97.4 97.5   MCHC 34.1 33.5   RDW 15 16*   MPV 10.7 10.5   Platelets 143 145           Recent Labs  Lab 06/09/16  0554 06/08/16  0526 06/07/16  0506 06/06/16  0353 06/05/16  1432   Sodium 130* 129* 130*  130* 131*   Potassium 3.4* 3.3* 3.2* 3.7 4.0   Chloride 89* 88* 88* 90* 89*   CO2 _0 BUN 100.0* 95.0* 94.0* 90.0* 88.0*   Creatinine 3.3* 3.3* 3.7* 3.7* 3.9*   Glucose 126* 176* 104* 141* 153*   Calcium 9.2 8.9 9.1 9.1 9.9   Magnesium 1.9 2.1  --   --   --          Results     Procedure Component Value Units Date/Time    Basic Metabolic Panel [[id number]]  (Abnormal) Collected:  06/09/16 0554    Specimen:  Blood Updated:  06/09/16 0640     Glucose 126 (H) mg/dL      BUN 100.0 (H) mg/dL      Creatinine 3.3 (H) mg/dL      Calcium 9.2 mg/dL      Sodium 130 (L) mEq/L      Potassium 3.4 (L) mEq/L      Chloride 89 (L) mEq/L      CO2 27 mEq/L     Magnesium [[id number]] Collected:  06/09/16 0554    Specimen:  Blood Updated:  06/09/16 0640     Magnesium 1.9 mg/dL     GFR [[id number]] Collected:  06/09/16 0554     Updated:  06/09/16 0640     EGFR 22.6            Rads:   Radiological Procedure reviewed.    Signed by: [name], MD    Assessment:Plan     AKI: Could be cardiorenal syndrome or advanced amyloidosis. Creatinine seems to be stable around 3.3.  His creatinine has been increasing slowly from 2 in June 2017 to 2.7 in Oct 2017 per [facility] results.  Would avoid acei, arbs, dye studies and nsaids. Dose meds for gfr<20    Cardiac amyloidosis with anasarca: Continue per cradiac transplant on milrinone and bumex 37m/hr. Goal 1liter negative per day and he seems to be appropriately fluid restricted.  May need to increase bumex or add metolazone for more aggressive fluid removal    Hypotension: Started on midodrine and seems bp is better in 100s      Hyponatremia:Hypervolemic. Continue diuresis as above.     Hypokalemia: Ordered KCl 40 qd but will give another 40 extra today    PAshley Akin MD  [phone]

## 2016-06-09 NOTE — Progress Notes (Signed)
Advanced Heart Failure and Transplant Progress Note    Heart Failure/Transplant Spectra Link [phone]    Gregory [name] a stage D NICM in the setting of AL amyloidosis which was diagnosed in December 2015. He responded to CyBorD (January - August 2016) though relapsed (increased serum free light chains from 35 in October 2017 to 378 in October 2017) and was started on revlimid 5 mg QOD on 04/09/16. I met the patient when he was hospitalized at [facility] 11/17-11/27/17, during which he was started on milrinone 0.375 mcg/kg/min. He has been under the care of physicians at [facility], though is planning on receiving care locally from this point on. Gregory [name] last seen on 12/12/17and returns to the clinic as previously scheduled.     Assessment:    GregorySmithhas a stage D NICM - cardiac amyloidosis in the setting of AL amyloidosis and is on palliative milrinone 0.375 mcg/kg/min. He is currently NYHA functional class IV.  The patient is decompensated and volume overloaded on exam. He appears to be in a low output state and his labs show increasing end organ damage. He is failing to diurese at home via oral diuretics most likely due to his increasing abdominal edema leading to decreased absorption. At this point, our options for successful outpatient diuresis are limited.  He needs midodrine in order to avoid hypotension. He is failing despite IV milrinone.  He previously has Revlimid at [facility].  He has had AKI on CKD. Elevated Tbili and jandice likely from congestion. He has a poor short term prognosis due to his progressive cardiac restriction.  He is diuresing but has had some ventricular tachycardia, hyponatremia, and contraction      Recommendations:     IV diuretics to continue to keep >1liter negative per day.  1 mg bumex / hr is working   IV milrinone - 0.375 ug/kg/min   Midodrine for blood pressure is okay   Appreciate oncology input and offer to follow up   Appreciate palliative care  evaluation   Try to wrap legs to decrease edema      ---------------------------------------------------------------------------------------------------------  Subjective:  Swollen without shortness of breath. Gout pain is better    Objective:    Vital signs in last 24 hours:   Temp:  [95.4 F (35.2 C)-97.6 F (36.4 C)] 96.6 F (35.9 C)  Heart Rate:  [98-105] 105  Resp Rate:  [17-20] 17  BP: (93-113)/(59-76) 103/59  SpO2: 97 %O2 Device: None (Room air)  Height: 165.1 cm (_0 )  Weight: 66.5 kg (146 lb 9.6 oz); Weight change:   Body mass index is 24.4 kg/m.[name]    Telemetry: no significant events    I/O last 3 completed shifts:  In: 1043.72 [I.V.:1043.72]  Out: 2500 [Urine:2500]                       Drips:  . bumetanide (BUMEX) infusion 1 mg/hr (06/09/16 0816)   . milrinone 0.375 mcg/kg/min (06/08/16 2333)     Physical Exam:   General: well-developed, alert, NAD  HEENT:  Anicteric sclera, JVP ~ elevated, no carotid bruits, carotids 2+ bilaterally  Lungs: CTA B/L, no rhonchi, wheezes or crackles with normal rate and effort  Cardiovascular: s1s2, normal rate, regular rhythm, no m/r/g, PMI laterally displaced  Abdominal: soft, NT/ND, +B.S., no HSM  Extremities: warm to touch, no cyanosis, +1 edema, peripheral pulses 2+ bilaterally  Neuromuscular exam: grossly non-focal, though not formally tested    Cardiac Studies:  Echocardiogramon 11/18/17shows a left  ventricular end-diastolic dimension of 41DQ, concentric LVH (walls 15 mm), global HKwith a left ventricular ejection fraction of 20-25%, and grade 3diastolic dysfunction. The right ventricle is dilated with reduced systolic function. The left atrium isdilated and the right atrium isdilated. The aortic valve is trileaflet withoutevidence of stenosis; there is trivialinsufficiency. The mitral valve is morphologically normal with mildregurgitation. There is mildtricuspid regurgitation. Pulmonary systolic pressure is estimated to be 88mHg above right  atrial pressure (10-161mg). There is trivialpulmonic insufficiency. A pericardial effusion is notpresent.    ECG Findingson 05/03/16: sinus rhythm, left anterior fascicular block and inferior MI, low voltage. As compared to hisprevious ECG on 11/1/17there is nono significant change.     Right heart catheterizationon 05/07/16 (prior to milrinone)showed a RA pressure of 8 mmHg, RV pressure of 24/8 mmHg, PA pressure of 29/9/16 mmHg, and a PW pressure of 13 mmHg. PA saturation was 45.8%. Cardiac output by Fick calculation was 1.9 L/min with a corresponding index of 1.2 L/min/m2. Cardiac output by thermodilution was 1.6 L/min with a corresponding index of 0.95 L/min/m2. PVR is 1.6 WU.      aspirin EC 81 mg Oral Daily   famotidine 20 mg Oral Q24H [facility]   heparin (porcine) 5,000 Units Subcutaneous Q8H [place] midodrine 10 mg Oral TID MEALS   potassium chloride 40 mEq Oral Daily   spironolactone 50 mg Oral Daily     No results for input(s): WBC, HGB, HCT, PLT, MCV in the last 72 hours.  Recent Labs      06/09/16   0554  06/08/16   0526   Sodium  130*  129*   Potassium  3.4*  3.3*   Chloride  89*  88*   CO2  27  24   BUN  100.0*  95.0*   Creatinine  3.3*  3.3*   Glucose  126*  176*   Calcium  9.2  8.9   Magnesium  1.9  2.1     No results for input(s): AST, ALT, ALKPHOS, PROT, ALB in the last 72 hours.  No results for input(s): PTT, PT, INR in the last 72 hours.  Lab Results   Component Value Date    ALT 19 06/05/2016    AST 35 (H) 06/05/2016    ALKPHOS 155 (H) 06/05/2016    BILITOTAL 3.6 (H) 06/05/2016

## 2016-06-09 NOTE — Progress Notes (Signed)
MEDICINE PROGRESS NOTE    Date Time: 06/09/16 4:22 PM  Patient Name: Gregory [name] T  Attending Physician: Clarnce Flock, MD    Assessment:   Principal Problem:    NICM (nonischemic cardiomyopathy)  Active Problems:    Chronic combined systolic and diastolic heart failure    Chronic renal failure, stage 3 (moderate)  Resolved Problems:    * No resolved hospital problems. *    Assessment: Mr. Grinder is a 69M with hx of NICM EF 25% 2/2 amyloidosis, CKD stage IV 2/2 amyloidosis home milrinone admitted directly from HF clinic for weight gain/fatigue found to have significant AKI and acute on chronic systolic heart failure exacerbation with hypotension and also with gout flare.     Plan:   1 Acute on chronic systolic heart failure:  - Continue milrinone @ 0.383mg/kg/min  - Bumex drip 170mhr  - Not on BB, ACEI in setting of hypotension started on midodrine  - Daily weights, strict I&Os, fluid restriction    2 AKI on CKD (baseline 2.5 with recent Cr 2.8-3): likely cardiorenal versus renal amyloid. Creatinine increasing probably due to diurisis   - Continue diuresis above  - No further workup warranted at this time  - Renal following    3 Amyloidosis: previous treated with Velcade (not confirmed) followed by Revlimid and dexamethasone.  - Appreciate oncology. No further inpatient workup/treatment warranted. F/u with Dr. ReJoneen Caraways outpatient after discharge.    4 Hypotension: asymptomatic. Likely 2/2 progressive HF. Improved numbers (SBP 90s-110s).  - Increased midodrine on 12/21 to 1055mID    5 Hyponatremia, hypervolemic: aggressive diuresis as above. Has been stable which would be expected given poor urine output on diuretics.     6 Gout flare: completed 3 days of prednisone 5m52mth resolution of symptoms. Will not start allopurinol given recent flare. At risk for recurrence given attempting diuresis.     GOC/Symptom mgmt: patient does currently feel SOB. Palliative care consult appreciated, requested by advanced  HF team in setting of poor prognosis, QOL after discharge, dyspnea mgmt if milrinone/diuresis fails    Safety Checklist:     DVT prophylaxis:  CHEST guideline (See page e199S) Chemical   Foley:  [place] Rn Foley protocol Not present   IVs:  Peripheral IV   PT/OT: Ordered   Daily CBC & or Chem ordered:  SHM/ABIM guidelines (see #5) Yes, due to clinical and lab instability   Reference for approximate charges of common labs: CBC auto diff - $76  BMP - $99  Mg - $79    Lines:     Patient Lines/Drains/Airways Status    Active PICC Line / CVC Line / PIV Line / Drain / Airway / Intraosseous Line / Epidural Line / ART Line / Line / Wound / Pressure Ulcer / NG/OG Tube     Name:   Placement date:   Placement time:   Site:   Days:    PICC Single Lumen 05/17/16 Right  05/17/16            21    CVC Single Lumen 06/06/16 Right Tunneled  06/06/16    1000        1    Peripheral IV 06/05/16 Left Forearm  06/05/16    1449    Forearm    2    Peripheral IV 06/05/16 Right Forearm  06/05/16    1435    Forearm    2    Surgical Airway  05/04/16  34                 Disposition: (Please see PAF column for Expected D/C Date)   Today's date: 06/09/2016  Admit Date: 06/05/2016 11:36 AM  LOS: 4  Clinical Milestones: Transition to oral Lasix  Anticipated discharge needs: HF follow up      Subjective     CC: NICM (nonischemic cardiomyopathy)    Interval History/24 hour events: No events.    HPI/Subjective: Patient has no complaints, no cp or sob diurising    Review of Systems:     As per HPI    Physical Exam:     VITAL SIGNS PHYSICAL EXAM   Temp:  [95.4 F (35.2 C)-97.6 F (36.4 C)] 96.6 F (35.9 C)  Heart Rate:  [98-105] 105  Resp Rate:  [17-20] 17  BP: (93-113)/(59-76) 103/59        Intake/Output Summary (Last 24 hours) at 06/09/16 1622  Last data filed at 06/09/16 1500   Gross per 24 hour   Intake           970.07 ml   Output             1595 ml   Net          -624.93 ml    Physical Exam  General: awake, alert X oriented x  3  Neck: Elevated JVP  Cardiovascular: regular rate and rhythm, + S4, no rubs/murmurs  Lungs: basilar crackles (improved) otherwise CTAB  Abdomen: soft, non-tender, non-distended; no palpable masses,  normoactive bowel sounds  Extremities: 2+ LE edema         Meds:     Medications were reviewed:  Current Facility-Administered Medications   Medication Dose Route Frequency   . aspirin EC  81 mg Oral Daily   . famotidine  20 mg Oral Q24H [place]   . heparin (porcine)  5,000 Units Subcutaneous Q8H [place]   . [START ON 06/10/2016] influenza  0.5 mL Intramuscular Once   . midodrine  10 mg Oral TID MEALS   . potassium chloride  40 mEq Oral Daily   . spironolactone  50 mg Oral Daily     Current Facility-Administered Medications   Medication Dose Route Frequency Last Rate   . bumetanide (BUMEX) infusion  1 mg/hr Intravenous Continuous 1 mg/hr (06/09/16 0816)   . milrinone  0.375 mcg/kg/min Intravenous Continuous 0.375 mcg/kg/min (06/09/16 1456)     Current Facility-Administered Medications   Medication Dose Route   . naloxone  0.2 mg Intravenous   . ondansetron  4 mg Oral    Or   . ondansetron  4 mg Intravenous   . prochlorperazine  10 mg Oral         Labs:     Labs (last 72 hours):      Recent Labs  Lab 06/06/16  0353 06/05/16  1432   WBC 5.54 5.89   Hgb 11.3* 12.0*   Hematocrit 33.1* 35.8*   Platelets 143 145            Recent Labs  Lab 06/09/16  0554 06/08/16  0526  06/05/16  1432   Sodium 130* 129* More results in Results Review 131*   Potassium 3.4* 3.3* More results in Results Review 4.0   Chloride 89* 88* More results in Results Review 89*   CO2 27 24 More results in Results Review 24   BUN 100.0* 95.0* More results in Results Review 88.0*   Creatinine 3.3* 3.3* More  results in Results Review 3.9*   Calcium 9.2 8.9 More results in Results Review 9.9   Albumin  --   --   --  3.4*   Protein, Total  --   --   --  6.8   Bilirubin, Total  --   --   --  3.6*   Alkaline Phosphatase  --   --   --  155*   ALT  --   --   --  19   AST  (SGOT)  --   --   --  35*   Glucose 126* 176* More results in Results Review 153*   More results in Results Review = values in this interval not displayed.                Microbiology, reviewed and are significant for:  Microbiology Results     None          Imaging, reviewed and are significant for:  No new imaging      Signed by: Clarnce Flock, MD

## 2016-06-10 LAB — GLUCOSE WHOLE BLOOD - POCT
Whole Blood Glucose POCT: 140 mg/dL — ABNORMAL HIGH (ref 70–100)
Whole Blood Glucose POCT: 128 mg/dL — ABNORMAL HIGH (ref 70–100)

## 2016-06-10 NOTE — Progress Notes (Signed)
PROGRESS NOTE    Date Time: 06/10/16 3:40 PM  Patient Name: Gregory [name],Gregory [name]                                NEPHROLOGY PROGRESS NOTE    Subjective:   Standing and feels ok but has not been walking   For fu of aki    Medications:      Scheduled Meds: PRN Meds:      aspirin EC 81 mg Oral Daily   famotidine 20 mg Oral Q24H [place]   heparin (porcine) 5,000 Units Subcutaneous Q8H [place]   influenza 0.5 mL Intramuscular Once   midodrine 10 mg Oral TID MEALS   potassium chloride 40 mEq Oral Daily   spironolactone 50 mg Oral Daily       Continuous Infusions:  . bumetanide (BUMEX) infusion 1 mg/hr (06/10/16 1047)   . milrinone 0.375 mcg/kg/min (06/10/16 1238)      naloxone 0.2 mg PRN   ondansetron 4 mg Q6H PRN   Or     ondansetron 4 mg Q6H PRN   prochlorperazine 10 mg Q6H PRN         Review of Systems:    No complaints of chest pain, palpitations shortness of breath, cough, headaches, dizziness, joint pain, abdominal pain, nausea, decreased appetite, or fevers.     Physical Exam:   Patient Vitals for the past 24 hrs:   BP Temp Temp src Pulse Resp SpO2 Weight   06/10/16 1125 103/65 (!) 96.8 F (36 C) Oral (!) 119 16 91 % -   06/10/16 1014 107/69 - - - - - -   06/10/16 0700 110/70 - - - 18 99 % -   06/10/16 0414 97/57 98.4 F (36.9 C) Oral (!) 114 18 94 % -   06/09/16 2312 104/66 97.4 F (36.3 C) Oral 76 18 95 % -   06/09/16 2027 93/60 97.6 F (36.4 C) Oral (!) 112 18 97 % -   06/09/16 1706 113/62 (!) 96.7 F (35.9 C) Oral 94 19 97 % -       Intake and Output Summary (Last 24 hours) at Date Time    Intake/Output Summary (Last 24 hours) at 06/10/16 1540  Last data filed at 06/10/16 1200   Gross per 24 hour   Intake              240 ml   Output              855 ml   Net             -615 ml       GEN: Well developed, no distress,  Mouth: No lesions on lips or tongue  Lungs: Clear, BS nl  Heart: RRR, no murmurs or rubs  LE: edema as before but has ACE wraps on bilatreal  Skin/mucosa: good turgor, mmm  Psych: Alert, speech and mood  good    Labs:     Recent Labs  Lab 06/06/16  0353 06/05/16  1432   WBC 5.54 5.89   RBC 3.40* 3.67*   Hgb 11.3* 12.0*   Hematocrit 33.1* 35.8*   MCV 97.4 97.5   MCHC 34.1 33.5   RDW 15 16*   MPV 10.7 10.5   Platelets 143 145           Recent Labs  Lab 06/09/16  0554 06/08/16  0526 06/07/16  0506 06/06/16  1610 06/05/16  1432   Sodium 130* 129* 130* 130* 131*   Potassium 3.4* 3.3* 3.2* 3.7 4.0   Chloride 89* 88* 88* 90* 89*   CO2 _0 BUN 100.0* 95.0* 94.0* 90.0* 88.0*   Creatinine 3.3* 3.3* 3.7* 3.7* 3.9*   Glucose 126* 176* 104* 141* 153*   Calcium 9.2 8.9 9.1 9.1 9.9   Magnesium 1.9 2.1  --   --   --          Results     ** No results found for the last 24 hours. **            Rads:   Radiological Procedure reviewed.    Signed by: [name], MD    Assessment:Plan     AKI: Could be cardiorenal syndrome or advanced amyloidosis. Creatinine seems to be stable around 3.3 but no new labs today. His creatinine has been increasing slowly from 2 in June 2017 to 2.7 in Oct 2017 per [facility] results. Would avoid acei, arbs, dye studies and nsaids. Dose meds for gfr<20    Cardiac amyloidosis with anasarca: Continue per cradiac transplant on milrinone and bumex 73m/hr. Goal 1liter negative per day but seems to have at;least 20lbs of extra fluid weight.  May need to increase bumex or add metolazone for more aggressive fluid removal    Hypotension: Continue midodrine    Hyponatremia:Hypervolemic. Continue diuresis as above.     Hypokalemia: On KCl 40 qd but K not at goal >4, may need to increase aldactone    PAshley Akin MD  [phone]

## 2016-06-10 NOTE — Plan of Care (Signed)
Problem: Heart Failure  Goal: Stable vital signs and fluid balance  Outcome: Progressing  Continues on Milrinone and Bumex gtt. Min U/O, Ace wraps to BLE intact, however extremities remain edematous. Pt mostly dangles feet at EOB or stands at bedside. Encouraged pt to elevate BLE as much as possible. No other issues or events overnight. VSS.     06/10/16 0454   Goal/Interventions addressed this shift   Stable vital signs and fluid balance Monitor/assess vital signs and telemetry per unit protocol;Weigh on admission and record weight daily;Assess signs and symptoms associated with cardiac rhythm changes;Monitor intake/output per unit protocol and/or LIP order;Monitor lab values;Monitor for leg swelling/edema and report to LIP if abnormal

## 2016-06-10 NOTE — Plan of Care (Signed)
Problem: Safety  Goal: Patient will be free from injury during hospitalization  Outcome: Progressing  A&Ox4 pt kind and cooperative. On tele, NSR-ST. VSS. No c/o pain, dizziness, nausea, or SOB. Remains on room air. Trio rounding completed with MD Shearon Stalls, discussed and noted decreasing bumex gtt from 35m to 0.586mhr, no new orders placed, paged MD chWarren Lacyo discuss. Maintained milrinone gtt _0 .375 mcg/kg/min. Maintained Bumex gtt _1 /hr. BLE ACE wraps in place. No questions or concerns at this time. Refusing bed alarm

## 2016-06-10 NOTE — Progress Notes (Signed)
MEDICINE PROGRESS NOTE    Date Time: 06/10/16 11:40 AM  Patient Name: Gregory [name]  Attending Physician: Clarnce Flock, MD    Assessment:   Principal Problem:    NICM (nonischemic cardiomyopathy)  Active Problems:    Chronic combined systolic and diastolic heart failure    Chronic renal failure, stage 3 (moderate)  Resolved Problems:    * No resolved hospital problems. *       A 70 year old [place] American male with PMH of NICM w/EF 20-25% secondary to AL Amyloidosis on palliative Milrinone gtt, CKD, HTN, Asthma was admitted from [facility] clinic for report of increased fatigue and weight gain on current home regimen requiring IV diuresis.     Plan:   - NICM in the setting of AL amyloidosis   Continue Palliative Milrinone at 0.375 mcg/kg/min  Continue Bumex gtt 1 mg/hr   Monitor I&O closely   Check weight daily   Replete electrolytes as necessary to keep K>4, Mg>2  Advance heart team following   Continue ASA, Aldactone   Oncology following for Amyloidosis, awaiting records from [facility] to coordinate treatment plan    - asymptomatic hypotension   continue Midodrine to 10 mg TID   BP stable     - AKI on CKD (baseline Cr 2.9)  Cr 3.3 on 12/24  Nephrology following      - Acute gout flare up   completed Prednisone 10 mg daily for 3 days     Heparin SC for DVT prophylaxis     Appreciate Palliative team consultation to discuss goals of care. Plan awaiting final recs from oncology      Case discussed with: Dr Warren Lacy, patient and RN   Safety Checklist:     DVT prophylaxis:  CHEST guideline (See page e199S) Chemical   Foley:  [place] Rn Foley protocol Not present   IVs:  Peripheral IV   PT/OT: Not needed   Daily CBC & or Chem ordered:  SHM/ABIM guidelines (see #5) Yes, due to clinical and lab instability   Reference for approximate charges of common labs: CBC auto diff - $76  BMP - $99  Mg - $79    Lines:     Patient Lines/Drains/Airways Status    Active PICC Line / CVC Line / PIV Line / Drain / Airway / Intraosseous  Line / Epidural Line / ART Line / Line / Wound / Pressure Ulcer / NG/OG Tube     Name:   Placement date:   Placement time:   Site:   Days:    PICC Single Lumen 05/17/16 Right  05/17/16            20    Peripheral IV 06/05/16 Left Forearm  06/05/16    1449    Forearm    less than 1    Peripheral IV 06/05/16 Right Forearm  06/05/16    1435    Forearm    less than 1    Surgical Airway  05/04/16            33                 Disposition: (Please see PAF column for Expected D/C Date)   Today's date: 06/10/2016  Admit Date: 06/05/2016 11:36 AM  LOS: 5  Clinical Milestones: Bumex gtt for diuresis   Anticipated discharge needs: none       Subjective   CC: NICM (nonischemic cardiomyopathy)    Interval History/24 hour events:  IV Bumex and Milrinone gtt     HPI/Subjective: no report of SOB, chest pain. He doesn'[name] understand why fluid will not go down    Review of Systems:     As per HPI    Physical Exam:     VITAL SIGNS PHYSICAL EXAM   Temp:  [96.7 F (35.9 C)-98.4 F (36.9 C)] 96.8 F (36 C)  Heart Rate:  [76-119] 119  Resp Rate:  [16-19] 16  BP: (93-113)/(57-70) 103/65  Blood Glucose:    Telemetry: SR      Intake/Output Summary (Last 24 hours) at 06/10/16 1140  Last data filed at 06/10/16 1047   Gross per 24 hour   Intake              480 ml   Output              930 ml   Net             -450 ml    Physical Exam  General: awake, alert X 3  Cardiovascular: regular rate and rhythm, no murmurs, rubs or gallops  Lungs: clear to auscultation bilaterally, without wheezing, rhonchi, or rales  Abdomen: soft, non-tender, non-distended; no palpable masses,  normoactive bowel sounds  Extremities: BLE pitting edema       Meds:     Medications were reviewed:  Current Facility-Administered Medications   Medication Dose Route Frequency   . aspirin EC  81 mg Oral Daily   . famotidine  20 mg Oral Q24H [place]   . heparin (porcine)  5,000 Units Subcutaneous Q8H [place]   . influenza  0.5 mL Intramuscular Once   . midodrine  10 mg Oral TID MEALS    . potassium chloride  40 mEq Oral Daily   . spironolactone  50 mg Oral Daily     Current Facility-Administered Medications   Medication Dose Route Frequency Last Rate   . bumetanide (BUMEX) infusion  1 mg/hr Intravenous Continuous 1 mg/hr (06/10/16 1047)   . milrinone  0.375 mcg/kg/min Intravenous Continuous 0.375 mcg/kg/min (06/10/16 0012)     Current Facility-Administered Medications   Medication Dose Route   . naloxone  0.2 mg Intravenous   . ondansetron  4 mg Oral    Or   . ondansetron  4 mg Intravenous   . prochlorperazine  10 mg Oral         Labs:     Labs (last 72 hours):      Recent Labs  Lab 06/06/16  0353 06/05/16  1432   WBC 5.54 5.89   Hgb 11.3* 12.0*   Hematocrit 33.1* 35.8*   Platelets 143 145            Recent Labs  Lab 06/09/16  0554 06/08/16  0526  06/05/16  1432   Sodium 130* 129* More results in Results Review 131*   Potassium 3.4* 3.3* More results in Results Review 4.0   Chloride 89* 88* More results in Results Review 89*   CO2 27 24 More results in Results Review 24   BUN 100.0* 95.0* More results in Results Review 88.0*   Creatinine 3.3* 3.3* More results in Results Review 3.9*   Calcium 9.2 8.9 More results in Results Review 9.9   Albumin  --   --   --  3.4*   Protein, Total  --   --   --  6.8   Bilirubin, Total  --   --   --  3.6*  Alkaline Phosphatase  --   --   --  155*   ALT  --   --   --  19   AST (SGOT)  --   --   --  35*   Glucose 126* 176* More results in Results Review 153*   More results in Results Review = values in this interval not displayed.                Imaging, reviewed and are significant for:  Picc Line Check/change    Result Date: 05/25/2016   Technically successful placement of a single lumen Power Line via the right internal jugular vein as described above. Dimitrios  Papadouris, MD 05/25/2016 10:30 PM         Signed by: Alyson Locket, NP   Medicine Hospitalist Nurse Practitioner   Spectra#: [phone]

## 2016-06-10 NOTE — Progress Notes (Signed)
Advanced Heart Failure and Transplant Progress Note    Heart Failure/Transplant Spectra Link [phone]    Gregory [name] a stage D NICM in the setting of AL amyloidosis which was diagnosed in December 2015. He responded to CyBorD (January - August 2016) though relapsed (increased serum free light chains from 35 in October 2017 to 378 in October 2017) and was started on revlimid 5 mg QOD on 04/09/16. I met the patient when he was hospitalized at [facility] 11/17-11/27/17, during which he was started on milrinone 0.375 mcg/kg/min. He has been under the care of physicians at [facility], though is planning on receiving care locally from this point on. [name] last seen on 12/12/17and returns to the clinic as previously scheduled.     Assessment:    Mr.Smithhas a stage D NICM - cardiac amyloidosis in the setting of AL amyloidosis and is on palliative milrinone 0.375 mcg/kg/min. He is currently NYHA functional class IV.  The patient is decompensated and volume overloaded on exam. He appears to be in a low output state and his labs show increasing end organ damage. He is failing to diurese at home via oral diuretics most likely due to his increasing abdominal edema leading to decreased absorption. At this point, our options for successful outpatient diuresis are limited.  He needs midodrine in order to avoid hypotension. He is failing despite IV milrinone.  He previously has Revlimid at [facility].  He has had AKI on CKD. Elevated Tbili and jandice likely from congestion. He has a poor short term prognosis due to his progressive cardiac restriction.  He is diuresing but has had some ventricular tachycardia, hyponatremia, and contraction. Now with tachycardia.  No hypotension.      Recommendations:     IV diuretics to 0.5 mg/hr Bumex and watch output   IV milrinone - 0.375 ug/kg/min   Midodrine for blood pressure is okay   Appreciate oncology input and offer to follow up   Appreciate palliative care  evaluation   Try to wrap legs to decrease edema      ---------------------------------------------------------------------------------------------------------  Subjective:  Not short of breath but is tired.    Objective:    Vital signs in last 24 hours:   Temp:  [96.7 F (35.9 C)-98.4 F (36.9 C)] 97.6 F (36.4 C)  Heart Rate:  [76-119] 110  Resp Rate:  [16-19] 16  BP: (93-113)/(57-70) 100/64  SpO2: 100 %O2 Device: None (Room air)  Height: 165.1 cm (_0 )  Weight: 66.3 kg (146 lb 1.6 oz); Weight change: -0.181 kg (-6.4 oz)  Body mass index is 24.31 kg/m.[name]    Telemetry: no significant events    I/O last 3 completed shifts:  In: 970.07 [P.O.:480; I.V.:490.07]  Out: 1975 [UEAVW:0981]                       Drips:  . bumetanide (BUMEX) infusion 1 mg/hr (06/10/16 1047)   . milrinone 0.375 mcg/kg/min (06/10/16 1238)     Physical Exam:   General: well-developed, alert, NAD  HEENT:  Anicteric sclera, JVP ~ elevated, no carotid bruits, carotids 2+ bilaterally  Lungs: CTA B/L, no rhonchi, wheezes or crackles with normal rate and effort  Cardiovascular: s1s2, normal rate, regular rhythm, no m/r/g, PMI laterally displaced  Abdominal: soft, NT/ND, +B.S., no HSM  Extremities: warm to touch, no cyanosis, +1 edema, peripheral pulses 2+ bilaterally  Neuromuscular exam: grossly non-focal, though not formally tested    Cardiac Studies:  Echocardiogramon 11/18/17shows a left ventricular  end-diastolic dimension of 27OJ, concentric LVH (walls 15 mm), global HKwith a left ventricular ejection fraction of 20-25%, and grade 3diastolic dysfunction. The right ventricle is dilated with reduced systolic function. The left atrium isdilated and the right atrium isdilated. The aortic valve is trileaflet withoutevidence of stenosis; there is trivialinsufficiency. The mitral valve is morphologically normal with mildregurgitation. There is mildtricuspid regurgitation. Pulmonary systolic pressure is estimated to be 68mHg above  right atrial pressure (10-138mg). There is trivialpulmonic insufficiency. A pericardial effusion is notpresent.    ECG Findingson 05/03/16: sinus rhythm, left anterior fascicular block and inferior MI, low voltage. As compared to hisprevious ECG on 11/1/17there is nono significant change.     Right heart catheterizationon 05/07/16 (prior to milrinone)showed a RA pressure of 8 mmHg, RV pressure of 24/8 mmHg, PA pressure of 29/9/16 mmHg, and a PW pressure of 13 mmHg. PA saturation was 45.8%. Cardiac output by Fick calculation was 1.9 L/min with a corresponding index of 1.2 L/min/m2. Cardiac output by thermodilution was 1.6 L/min with a corresponding index of 0.95 L/min/m2. PVR is 1.6 WU.      aspirin EC 81 mg Oral Daily   famotidine 20 mg Oral Q24H [facility]   heparin (porcine) 5,000 Units Subcutaneous Q8H [place] influenza 0.5 mL Intramuscular Once   midodrine 10 mg Oral TID MEALS   potassium chloride 40 mEq Oral Daily   spironolactone 50 mg Oral Daily     No results for input(s): WBC, HGB, HCT, PLT, MCV in the last 72 hours.  Recent Labs      06/09/16   0554  06/08/16   0526   Sodium  130*  129*   Potassium  3.4*  3.3*   Chloride  89*  88*   CO2  27  24   BUN  100.0*  95.0*   Creatinine  3.3*  3.3*   Glucose  126*  176*   Calcium  9.2  8.9   Magnesium  1.9  2.1     No results for input(s): AST, ALT, ALKPHOS, PROT, ALB in the last 72 hours.  No results for input(s): PTT, PT, INR in the last 72 hours.  Lab Results   Component Value Date    ALT 19 06/05/2016    AST 35 (H) 06/05/2016    ALKPHOS 155 (H) 06/05/2016    BILITOTAL 3.6 (H) 06/05/2016

## 2016-06-11 DIAGNOSIS — R600 Localized edema: Secondary | ICD-10-CM

## 2016-06-11 DIAGNOSIS — I43 Cardiomyopathy in diseases classified elsewhere: Secondary | ICD-10-CM

## 2016-06-11 DIAGNOSIS — E854 Organ-limited amyloidosis: Secondary | ICD-10-CM

## 2016-06-11 LAB — BASIC METABOLIC PANEL
BUN: 101 mg/dL — ABNORMAL HIGH (ref 9.0–28.0)
CO2: 24 mEq/L (ref 22–29)
Calcium: 9.5 mg/dL (ref 8.5–10.5)
Chloride: 93 mEq/L — ABNORMAL LOW (ref 100–111)
Creatinine: 3.5 mg/dL — ABNORMAL HIGH (ref 0.7–1.3)
Glucose: 107 mg/dL — ABNORMAL HIGH (ref 70–100)
Potassium: 4 mEq/L (ref 3.5–5.1)
Sodium: 132 mEq/L — ABNORMAL LOW (ref 136–145)

## 2016-06-11 LAB — GFR: EGFR: 21.1

## 2016-06-11 LAB — MAGNESIUM: Magnesium: 1.6 mg/dL (ref 1.6–2.6)

## 2016-06-11 LAB — GLUCOSE WHOLE BLOOD - POCT
Whole Blood Glucose POCT: 157 mg/dL — ABNORMAL HIGH (ref 70–100)
Whole Blood Glucose POCT: 125 mg/dL — ABNORMAL HIGH (ref 70–100)

## 2016-06-11 MED ORDER — CHLOROTHIAZIDE SODIUM 500 MG IV SOLR
500.0000 mg | Freq: Two times a day (BID) | INTRAVENOUS | Status: DC
Start: 2016-06-11 — End: 2016-06-12
  Administered 2016-06-11 – 2016-06-12 (×2): 500 mg via INTRAVENOUS
  Filled 2016-06-11 (×5): qty 500

## 2016-06-11 MED ORDER — MAGNESIUM SULFATE IN D5W 1-5 GM/100ML-% IV SOLN
1.0000 g | Freq: Once | INTRAVENOUS | Status: AC
Start: 2016-06-11 — End: 2016-06-11
  Administered 2016-06-11: 1 g via INTRAVENOUS
  Filled 2016-06-11: qty 100

## 2016-06-11 NOTE — Progress Notes (Signed)
MEDICINE PROGRESS NOTE    Date Time: 06/11/16 7:38 AM  Patient Name: Gregory [name] T  Attending Physician: Clarnce Flock, MD    Assessment:   Principal Problem:    NICM (nonischemic cardiomyopathy)  Active Problems:    Chronic combined systolic and diastolic heart failure    Chronic renal failure, stage 3 (moderate)  Resolved Problems:    * No resolved hospital problems. *       A 70 year old [place] American male with PMH of NICM w/EF 20-25% secondary to AL Amyloidosis on palliative Milrinone gtt, CKD, HTN, Asthma was admitted from [facility] clinic for report of increased fatigue and weight gain on current home regimen requiring IV diuresis.     Plan:   - NICM in the setting of AL amyloidosis   Continue Palliative Milrinone at 0.375 mcg/kg/min  Increased Bumex gtt to 1 mg/hr   Start Diuril 500 mg IV BID, patient did not respond to Metolazone per [facility] heart team   Monitor I&O closely   Check weight daily   Replete electrolytes as necessary to keep K>4, Mg>2  Advance heart team following   Continue ASA, Aldactone     - asymptomatic hypotension   continue Midodrine to 10 mg TID   BP stable     - AKI on CKD (baseline Cr 2.9)  Cr 3.5   Nephrology following      - Acute gout flare up   completed Prednisone 10 mg daily for 3 days     Heparin SC for DVT prophylaxis     Appreciate Palliative team consultation to discuss goals of care. patient declines palliative measures. Notified oncology (Dr Oneal Grout) about [facility] records available via care everywhere       Case discussed with: Dr Warren Lacy, patient and RN   Safety Checklist:     DVT prophylaxis:  CHEST guideline (See page [id number]) Chemical   Foley:  [place]ick Rn Foley protocol Not present   IVs:  Peripheral IV   PT/OT: Not needed   Daily CBC & or Chem ordered:  SHM/ABIM guidelines (see #5) Yes, due to clinical and lab instability   Reference for approximate charges of common labs: CBC auto diff - $76  BMP - $99  Mg - $79    Lines:     Patient Lines/Drains/Airways Status     Active PICC Line / CVC Line / PIV Line / Drain / Airway / Intraosseous Line / Epidural Line / ART Line / Line / Wound / Pressure Ulcer / NG/OG Tube     Name:   Placement date:   Placement time:   Site:   Days:    PICC Single Lumen 05/17/16 Right  05/17/16            20    Peripheral IV 06/05/16 Left Forearm  06/05/16    1449    Forearm    less than 1    Peripheral IV 06/05/16 Right Forearm  06/05/16    1435    Forearm    less than 1    Surgical Airway  05/04/16            33                 Disposition: (Please see PAF column for Expected D/C Date)   Today's date: 06/11/2016  Admit Date: 06/05/2016 11:36 AM  LOS: 6  Clinical Milestones: Bumex gtt for diuresis   Anticipated discharge needs: none  Subjective   CC: NICM (nonischemic cardiomyopathy)    Interval History/24 hour events: IV Bumex and Milrinone gtt     HPI/Subjective: no report of SOB, chest pain.  Review of Systems:     As per HPI    Physical Exam:     VITAL SIGNS PHYSICAL EXAM   Temp:  [96.8 F (36 C)-98 F (36.7 C)] 98 F (36.7 C)  Heart Rate:  [110-119] 111  Resp Rate:  [16-20] 20  BP: (98-124)/(60-76) 98/60  Blood Glucose:    Telemetry: SR      Intake/Output Summary (Last 24 hours) at 06/11/16 5993  Last data filed at 06/11/16 0515   Gross per 24 hour   Intake              240 ml   Output              455 ml   Net             -215 ml    Physical Exam  General: awake, alert X 3  Cardiovascular: regular rate and rhythm, no murmurs, rubs or gallops  Lungs: clear to auscultation bilaterally, without wheezing, rhonchi, or rales  Abdomen: soft, non-tender, non-distended; no palpable masses,  normoactive bowel sounds  Extremities: BLE pitting edema       Meds:     Medications were reviewed:  Current Facility-Administered Medications   Medication Dose Route Frequency   . aspirin EC  81 mg Oral Daily   . famotidine  20 mg Oral Q24H [place]   . heparin (porcine)  5,000 Units Subcutaneous Q8H [place]   . influenza  0.5 mL Intramuscular Once   . midodrine  10  mg Oral TID MEALS   . potassium chloride  40 mEq Oral Daily   . spironolactone  50 mg Oral Daily     Current Facility-Administered Medications   Medication Dose Route Frequency Last Rate   . bumetanide (BUMEX) infusion  0.5 mg/hr Intravenous Continuous 0.5 mg/hr (06/10/16 1901)   . milrinone  0.375 mcg/kg/min Intravenous Continuous 0.375 mcg/kg/min (06/11/16 0108)     Current Facility-Administered Medications   Medication Dose Route   . naloxone  0.2 mg Intravenous   . ondansetron  4 mg Oral    Or   . ondansetron  4 mg Intravenous   . prochlorperazine  10 mg Oral         Labs:     Labs (last 72 hours):      Recent Labs  Lab 06/06/16  0353 06/05/16  1432   WBC 5.54 5.89   Hgb 11.3* 12.0*   Hematocrit 33.1* 35.8*   Platelets 143 145            Recent Labs  Lab 06/11/16  0412 06/09/16  0554  06/05/16  1432   Sodium 132* 130* More results in Results Review 131*   Potassium 4.0 3.4* More results in Results Review 4.0   Chloride 93* 89* More results in Results Review 89*   CO2 24 27 More results in Results Review 24   BUN 101.0* 100.0* More results in Results Review 88.0*   Creatinine 3.5* 3.3* More results in Results Review 3.9*   Calcium 9.5 9.2 More results in Results Review 9.9   Albumin  --   --   --  3.4*   Protein, Total  --   --   --  6.8   Bilirubin, Total  --   --   --  3.6*   Alkaline Phosphatase  --   --   --  155*   ALT  --   --   --  19   AST (SGOT)  --   --   --  35*   Glucose 107* 126* More results in Results Review 153*   More results in Results Review = values in this interval not displayed.                Imaging, reviewed and are significant for:  Picc Line Check/change    Result Date: 05/25/2016   Technically successful placement of a single lumen Power Line via the right internal jugular vein as described above. Dimitrios  Papadouris, MD 05/25/2016 10:30 PM         Signed by: Alyson Locket, NP   Medicine Hospitalist Nurse Practitioner   Spectra#: [phone]

## 2016-06-11 NOTE — Progress Notes (Signed)
PROGRESS NOTE    Date Time: 06/11/16 6:12 PM  Patient Name: [name],[name]                                NEPHROLOGY PROGRESS NOTE    Subjective:   Seems sad that he is not going home today  For fu of aki    Medications:      Scheduled Meds: PRN Meds:      aspirin EC 81 mg Oral Daily   chlorothiazide 500 mg Intravenous BID   famotidine 20 mg Oral Q24H [place]   heparin (porcine) 5,000 Units Subcutaneous Q8H [place]   influenza 0.5 mL Intramuscular Once   midodrine 10 mg Oral TID MEALS   potassium chloride 40 mEq Oral Daily   spironolactone 50 mg Oral Daily       Continuous Infusions:  . bumetanide (BUMEX) infusion 1 mg/hr (06/11/16 1211)   . milrinone 0.375 mcg/kg/min (06/11/16 1638)      naloxone 0.2 mg PRN   ondansetron 4 mg Q6H PRN   Or     ondansetron 4 mg Q6H PRN   prochlorperazine 10 mg Q6H PRN         Review of Systems:    No complaints of chest pain, palpitations shortness of breath, cough, headaches, dizziness, joint pain, abdominal pain, nausea, decreased appetite, or fevers.     Physical Exam:   Patient Vitals for the past 24 hrs:   BP Temp Temp src Pulse Resp SpO2 Weight   06/11/16 1209 94/58 98.1 F (36.7 C) Oral (!) 101 16 95 % -   06/11/16 0808 91/56 98 F (36.7 C) - (!) 113 16 95 % -   06/11/16 0800 - - - - - - 66.4 kg (146 lb 6.4 oz)   06/11/16 0415 98/60 98 F (36.7 C) Oral (!) 111 20 97 % -   06/10/16 2235 124/75 97.7 F (36.5 C) Oral (!) 113 17 97 % -   06/10/16 1940 112/76 97.3 F (36.3 C) Axillary (!) 113 17 98 % -       Intake and Output Summary (Last 24 hours) at Date Time    Intake/Output Summary (Last 24 hours) at 06/11/16 1812  Last data filed at 06/11/16 1749   Gross per 24 hour   Intake           928.42 ml   Output              330 ml   Net           598.42 ml       GEN: Well developed, no distress,  Mouth: No lesions on lips or tongue  Lungs: rales present, BS nl  Heart: RRR, no murmurs or rubs  LE: edema 4+ bilatreal  Skin/mucosa: good turgor, mmm  Psych: Alert, speech and mood  good    Labs:     Recent Labs  Lab 06/06/16  0353 06/05/16  1432   WBC 5.54 5.89   RBC 3.40* 3.67*   Hgb 11.3* 12.0*   Hematocrit 33.1* 35.8*   MCV 97.4 97.5   MCHC 34.1 33.5   RDW 15 16*   MPV 10.7 10.5   Platelets 143 145           Recent Labs  Lab 06/11/16  0412 06/09/16  0554 06/08/16  0526 06/07/16  0506 06/06/16  0353   Sodium 132* 130* 129* 130*  130*   Potassium 4.0 3.4* 3.3* 3.2* 3.7   Chloride 93* 89* 88* 88* 90*   CO2 _0 BUN 101.0* 100.0* 95.0* 94.0* 90.0*   Creatinine 3.5* 3.3* 3.3* 3.7* 3.7*   Glucose 107* 126* 176* 104* 141*   Calcium 9.5 9.2 8.9 9.1 9.1   Magnesium 1.6 1.9 2.1  --   --          Results     Procedure Component Value Units Date/Time    Glucose Whole Blood - POCT [[id number]]  (Abnormal) Collected:  06/11/16 1209     Updated:  06/11/16 1212     POCT - Glucose Whole blood 125 (H) mg/dL     Glucose Whole Blood - POCT [[id number]]  (Abnormal) Collected:  06/11/16 0819     Updated:  06/11/16 0834     POCT - Glucose Whole blood 157 (H) mg/dL     Magnesium [[id number]] Collected:  06/11/16 0412    Specimen:  Blood Updated:  06/11/16 0519     Magnesium 1.6 mg/dL     GFR [[id number]] Collected:  06/11/16 0412     Updated:  06/11/16 0519     EGFR 14.1    Basic Metabolic Panel [[id number]]  (Abnormal) Collected:  06/11/16 0412    Specimen:  Blood Updated:  06/11/16 0519     Glucose 107 (H) mg/dL      BUN 101.0 (H) mg/dL      Creatinine 3.5 (H) mg/dL      Calcium 9.5 mg/dL      Sodium 132 (L) mEq/L      Potassium 4.0 mEq/L      Chloride 93 (L) mEq/L      CO2 24 mEq/L     Glucose Whole Blood - POCT [[id number]]  (Abnormal) Collected:  06/10/16 2051     Updated:  06/10/16 2106     POCT - Glucose Whole blood 128 (H) mg/dL             Rads:   Radiological Procedure reviewed.    Signed by: [name], MD    Assessment:Plan     AKI: Could be cardiorenal syndrome or advanced amyloidosis. Creatinine higher likely due to poor renal perfusion. His creatinine has been increasing slowly from 2  in June 2017 to 2.7 in Oct 2017 per [facility] results. Would avoid acei, arbs, dye studies and nsaids. Dose meds for gfr<20    Cardiac amyloidosis with anasarca: Continue per cradiac transplant on milrinone and bumex 48m/hr but diuril added. Goal 1liter negative per day but seems to have at;least 20lbs of extra fluid weight.     Hypotension: Continue midodrine    Hyponatremia:Hypervolemic. Continue diuresis as above.     Hypokalemia: On KCl 40 qd but K and at goal >4    D/w Dr MLu Duffel   PAshley Akin MD  [phone]

## 2016-06-11 NOTE — UM Notes (Signed)
06/11/16:  Patient remains as IP status. IV diuretics to 0.5 mg/ hour , watch outpt, still on IV milrinone, oncology out put pending.  Tachy to 119 , 114, 112.     Blood pressure 91/56, pulse (!) 113, temperature 98 F (36.7 C), resp. rate 16, height 1.651 m (_0 ), weight 66.4 kg (146 lb 6.4 oz), SpO2 95 %.    Scheduled Meds:  Current Facility-Administered Medications   Medication Dose Route Frequency   . aspirin EC  81 mg Oral Daily   . chlorothiazide  500 mg Intravenous BID   . famotidine  20 mg Oral Q24H [place]   . heparin (porcine)  5,000 Units Subcutaneous Q8H [place]   . influenza  0.5 mL Intramuscular Once   . midodrine  10 mg Oral TID MEALS   . potassium chloride  40 mEq Oral Daily   . spironolactone  50 mg Oral Daily     Continuous Infusions:  . bumetanide (BUMEX) infusion 10 mg/hr (06/11/16 1044)   . milrinone 0.375 mcg/kg/min (06/11/16 0108)     PRN Meds:.naloxone, ondansetron **OR** ondansetron, prochlorperazine      Thressa Sheller RN, BSN   Utilization Review RN  [facility], [place]   [phone]

## 2016-06-11 NOTE — Progress Notes (Signed)
Advanced Heart Failure and Transplant Progress Note    Heart Failure/Transplant Spectra Link [phone]    Assessment:   Non-ischemiccardiomyopathy, EF 20-25%, NYHA Class 4, ACC/AHA Stage D   AL cardiac amyloidosis diagnosed December 2015   Initial response to CyBorD therapy; however, patient has relapsed and started revlimid 5 mg PO QOD and dexamethasone 20 mg weekly in October 2017   Acute on chronic systolic and diastolicheart failure   Low cardiac output   Restrictive cardiac physiology   Acute on chronic renal failure, stage V   Hypokalemia   Hyponatremia    Mild thrombocytopenia   Chronic gout   Overall poor prognosis   Patient has declined palliative measures at this juncture    Recommendations:   Continue milrinone 0.375 mcg/kg/min   Change bumetanide infusion to 1 mg/hr   Start diuril 500 mg IV BID (he did not respond to metolazone as an outpatient)   Fluid restrict to 1.5 L/day and Na 2 gm/day   Daily standing weights   No beta blocker due to relative hypotension   No ACE/ARB/ARNI due to severity of renal failure and relative hypotension   Continue spironolactone 50 mg QD   Patient's oncology records from [facility] are in [place] in Epic   Activity as tolerated      ---------------------------------------------------------------------------------------------------------  Subjective:  Patient sitting up in chair, reports that his legs remain swollen, without any significant change since admission. He denies dyspnea and his appetite remains stable though diminished. He is optimistic that his fluid can be controlled with medications and as this is his principle symptom, he is uninterested in palliative care. Discussed with Dr Warren Lacy and Dr Bosie Helper.    Objective:    Vital signs in last 24 hours:   Temp:  [96.8 F (36 C)-98 F (36.7 C)] 98 F (36.7 C)  Heart Rate:  [110-119] 113  Resp Rate:  [16-20] 16  BP: (91-124)/(56-76) 91/56  SpO2: 95 %O2 Device: None (Room air)  Height:  165.1 cm (_0 )  Weight: 66.4 kg (146 lb 6.4 oz); Weight change: -0.227 kg (-8 oz)  Body mass index is 24.36 kg/m.[name]    Telemetry: sinus at 98 BPM, PVCs    I/O last 3 completed shifts:  In: 781.28 [P.O.:240; I.V.:541.28]  Out: 985 [Urine:985]                       Drips:  . bumetanide (BUMEX) infusion 0.5 mg/hr (06/10/16 1901)   . milrinone 0.375 mcg/kg/min (06/11/16 0108)     Physical Exam:   General: alert, appears stated age, cooperative and no distress  HEENT: Conjunctiva pale, bucal mucosa moist. JVP 12 cm with large v waves, no adenopathy, no carotid bruit and supple, symmetrical, trachea midline.   Lungs: clear to auscultation bilaterally  Cardiovascular: regular rate and rhythm, S1, S2 normal  Abdominal: soft, non-tender; bowel sounds normal; no masses, no organomegaly. Hepatojular reflux ispresent.  Extremities: edema 2-3+ pitting edema of the legs bilaterally. Extremities are warmto the touch. legs are wrapped to knees.  Neuromuscular exam: Grossly non-focal though not formally tested. Normalmuscle tone. Patient able toperform PT and OT.    Cardiac Studies:  Echocardiogramon 11/18/17shows a left ventricular end-diastolic dimension of 68TL, concentric LVH (walls 15 mm), global HKwith a left ventricular ejection fraction of 20-25%, and grade 3diastolic dysfunction. The right ventricle is dilated with reduced systolic function. The left atrium isdilated and the right atrium isdilated. The aortic valve is trileaflet withoutevidence of stenosis;  there is trivialinsufficiency. The mitral valve is morphologically normal with mildregurgitation. There is mildtricuspid regurgitation. Pulmonary systolic pressure is estimated to be 7mHg above right atrial pressure (10-165mg). There is trivialpulmonic insufficiency. A pericardial effusion is notpresent.    ECG Findingson 05/03/16: sinus rhythm, left anterior fascicular block and inferior MI, low voltage. As compared to  hisprevious ECG on 11/1/17there is nono significant change.     Right heart catheterization on 05/07/16 showed a RA pressure of 8 mmHg, RV pressure of 24/8 mmHg, PA pressure of 29/9/16 mmHg, and a PW pressure of 13 mmHg.  PA saturation was 45.8%.  Cardiac output by Fick calculation was 1.9 L/min with a corresponding index of 1.2 L/min/m2.   Cardiac output by thermodilution was 1.6 L/min with a corresponding index of 0.95 L/min/m2.  PVR is 1.6 WU.    Imaging:  Chest X-Ray: none      aspirin EC 81 mg Oral Daily   famotidine 20 mg Oral Q24H [place] heparin (porcine) 5,000 Units Subcutaneous Q8H [place] influenza 0.5 mL Intramuscular Once   magnesium sulfate 1 g Intravenous Once   midodrine 10 mg Oral TID MEALS   potassium chloride 40 mEq Oral Daily   spironolactone 50 mg Oral Daily     Lab Results   Component Value Date    WBC 5.54 06/06/2016    HGB 11.3 (L) 06/06/2016    PLT 143 06/06/2016    NA 132 (L) 06/11/2016    K 4.0 06/11/2016    BUN 101.0 (H) 06/11/2016    CREAT 3.5 (H) 06/11/2016    EGFR 21.1 06/11/2016    MG 1.6 06/11/2016    AST 35 (H) 06/05/2016    ALB 3.4 (L) 06/05/2016    INR 1.3 (H) 04/17/2016    BNP 4,994 (H) 06/05/2016

## 2016-06-11 NOTE — Progress Notes (Signed)
[facility] at [phone] given referral in person, Patient is active with [facility] for Milrinone- Dsc plans for 06/11/16. MCR focus patient.    Army Chaco  Clinical Case Manager RN  [facility]  [phone]

## 2016-06-11 NOTE — Plan of Care (Signed)
Problem: Heart Failure  Goal: Stable vital signs and fluid balance  Outcome: Progressing  Continues on Milrinone and Bumex gtt. Min U/O noted. Pt removed ACE wraps stating he would like them off and will wear them again in the am. Pt currently sleeping - does not appear to be in any distress. Will continue to monitor.     06/11/16 8403   Goal/Interventions addressed this shift   Stable vital signs and fluid balance Monitor/assess vital signs and telemetry per unit protocol;Weigh on admission and record weight daily;Assess signs and symptoms associated with cardiac rhythm changes;Monitor intake/output per unit protocol and/or LIP order;Monitor lab values;Monitor for leg swelling/edema and report to LIP if abnormal

## 2016-06-11 NOTE — Plan of Care (Signed)
Problem: Heart Failure  Goal: Stable vital signs and fluid balance  Outcome: Progressing   06/11/16 1549   Goal/Interventions addressed this shift   Stable vital signs and fluid balance Monitor/assess vital signs and telemetry per unit protocol;Weigh on admission and record weight daily;Assess signs and symptoms associated with cardiac rhythm changes;Monitor intake/output per unit protocol and/or LIP order;Monitor lab values;Monitor for leg swelling/edema and report to LIP if abnormal     Goal: Mobility/Activity is maintained at optimal level for patient  Outcome: Progressing   06/11/16 1549   Goal/Interventions addressed this shift   Mobility/activity is maintained at optimal level for patient Increase mobility as tolerated/progressive mobility;Encourage independent activity per ability;Perform active/passive ROM;Plan activities to conserve energy, plan rest periods;Reposition patient every 2 hours and as needed unless able to reposition self;Assess for changes in respiratory status, level of consciousness and/or development of fatigue;Maintain proper body alignment     Goal: Nutritional intake is adequate  Outcome: Progressing   06/11/16 1549   Goal/Interventions addressed this shift   Nutritional intake is adequate Monitor daily weights;Assist patient with meals/food selection;Allow adequate time for meals;Encourage/perform oral hygiene as appropriate;Encourage/administer dietary supplements as ordered (i.e. tube feed, TPN, oral, OGT/NGT, supplements);Consult/collaborate with Clinical Nutritionist;Include patient/patient care companion in decisions related to nutrition;Assess anorexia, appetite, and amount of meal/food tolerated       Problem: Safety  Goal: Patient will be free from infection during hospitalization  Outcome: Not Progressing   06/11/16 1549   Goal/Interventions addressed this shift   Free from Infection during hospitalization Assess and monitor for signs and symptoms of infection;Monitor  lab/diagnostic results;Monitor all insertion sites (i.e. indwelling lines, tubes, urinary catheters, and drains)

## 2016-06-12 LAB — BASIC METABOLIC PANEL
BUN: 101 mg/dL — ABNORMAL HIGH (ref 9.0–28.0)
CO2: 25 mEq/L (ref 22–29)
Calcium: 9.7 mg/dL (ref 8.5–10.5)
Chloride: 91 mEq/L — ABNORMAL LOW (ref 100–111)
Creatinine: 3.6 mg/dL — ABNORMAL HIGH (ref 0.7–1.3)
Glucose: 104 mg/dL — ABNORMAL HIGH (ref 70–100)
Potassium: 4.3 mEq/L (ref 3.5–5.1)
Sodium: 131 mEq/L — ABNORMAL LOW (ref 136–145)

## 2016-06-12 LAB — GFR: EGFR: 20.5

## 2016-06-12 LAB — MAGNESIUM: Magnesium: 1.8 mg/dL (ref 1.6–2.6)

## 2016-06-12 MED ORDER — TRAZODONE HCL 50 MG PO TABS
50.0000 mg | ORAL_TABLET | Freq: Once | ORAL | Status: DC
Start: 2016-06-12 — End: 2016-06-12

## 2016-06-12 MED ORDER — METOLAZONE 5 MG PO TABS
5.0000 mg | ORAL_TABLET | Freq: Every day | ORAL | Status: DC
Start: 2016-06-13 — End: 2016-06-13
  Administered 2016-06-13: 5 mg via ORAL
  Filled 2016-06-12: qty 1

## 2016-06-12 MED ORDER — BUMETANIDE 1 MG PO TABS
6.0000 mg | ORAL_TABLET | Freq: Two times a day (BID) | ORAL | Status: DC
Start: 2016-06-12 — End: 2016-06-13
  Administered 2016-06-12 – 2016-06-13 (×3): 6 mg via ORAL
  Filled 2016-06-12 (×3): qty 6

## 2016-06-12 NOTE — Progress Notes (Signed)
Patient family will transport patient home. Milrinone education with wife in room on 06/13/16 at 3:00 pm. Patient will follow up with transplant team on 06/20/16. [employer] has delivered Milrinone, being stored in Med room, Caregivers to provide SN services.    Army Chaco  Clinical Case Manager RN  [facility]  [phone]       06/12/16 1604   Discharge Disposition   Patient preference/choice provided? Yes   Physical Discharge Disposition Home with Needs   Name of [place] Other (comment box)  ([place])   Name of [place], [place]   Mode of Scientist, product/process development   Patient/Family/POA notified of transfer plan Yes   Patient agreeable to discharge plan/expected d/c date? Yes   Family/POA agreeable to discharge plan/expected d/c date? Yes   Bedside nurse notified of transport plan? Yes   Outpatient Services   Home Health Skilled Nursing   CM Interventions   Follow up appointment scheduled? Yes   Follow up appointment scheduled with: Other (comment)  (Transplant team 06/21/15)   Referral made for home health RN visit? Yes   Multidisciplinary rounds/family meeting before d/c? Yes   Medicare Checklist   Is this a Medicare patient? Yes   Patient received 1st IMM Letter? Yes   3 midnight inpatient qualifying stay (SNF only) No   If LOS 3 days or greater, did patient received 2nd IMM Letter? n/a

## 2016-06-12 NOTE — Progress Notes (Signed)
HEMATOLOGY ONCOLOGY PROGRESS NOTE    Date Time: 06/12/16 11:33 AM  Patient Name: Stacey,Taye T    Principal Problem:    Acute on chronic combined systolic and diastolic heart failure  Active Problems:    Bilateral lower extremity edema    Light chain (AL) amyloidosis    Chronic renal failure, stage 3 (moderate)    Cardiac amyloidosis    NICM (nonischemic cardiomyopathy)      HISTORY:    70 yo seen in consultation bt Dr Joneen Caraway with known h/o amyloid treated previously at [facility].  Course complicated by cardiac dysfunction with a low EF.  Pt anxious to go home.  Likely discharge in am.  Pt has been seen by Palliative care       PHYSICAL EXAM:     Vitals:    06/12/16 1004   BP: 91/54   Pulse: (!) 101   Resp:    Temp:    SpO2:        Intake and Output Summary (Last 24 hours) at Date Time    Intake/Output Summary (Last 24 hours) at 06/12/16 1133  Last data filed at 06/12/16 7841   Gross per 24 hour   Intake           987.68 ml   Output             1950 ml   Net          -962.32 ml         Appearance: in no acute distress  Skin: no rash  HEENT: no icterus, no oral moniliasis or mucositis  Lungs: clear to percussion and ausculation  Heart: regular rate and rhythm  Abdomen: no tenderness, no organomegaly or mass  Extremities: no edema  Neuro: alert, strength grossly intact        ASSESSMENT/PLAN:       1.AL- Amyloid.  I have contacted our office-we will reschedule outpt appt with Dr Joneen Caraway to discuss systemic treatment options.  Prognosis guarded mostly secondary to cardiac failure. We will avoid Dex to prevent further fluid retention.  Discussed with wife-we will call her cell phone with appt time     [name], MD  [facility]  [phone]    MEDS:   Scheduled Meds:  Current Facility-Administered Medications   Medication Dose Route Frequency   . aspirin EC  81 mg Oral Daily   . famotidine  20 mg Oral Q24H [place]   . heparin (porcine)  5,000 Units Subcutaneous Q8H [place]   . influenza  0.5 mL Intramuscular  Once   . [START ON 06/13/2016] metOLazone  5 mg Oral Daily   . midodrine  10 mg Oral TID MEALS   . spironolactone  50 mg Oral Daily     Continuous Infusions:  . bumetanide (BUMEX) infusion 1 mg/hr (06/12/16 0811)   . milrinone 0.375 mcg/kg/min (06/12/16 0450)     PRN Meds:.naloxone, ondansetron **OR** ondansetron, prochlorperazine     LABS:     Results     Procedure Component Value Units Date/Time    Basic Metabolic Panel [[id number]]  (Abnormal) Collected:  06/12/16 0051    Specimen:  Blood Updated:  06/12/16 0131     Glucose 104 (H) mg/dL      BUN 101.0 (H) mg/dL      Creatinine 3.6 (H) mg/dL      Calcium 9.7 mg/dL      Sodium 131 (L) mEq/L      Potassium 4.3 mEq/L  Chloride 91 (L) mEq/L      CO2 25 mEq/L     Magnesium [[id number]] Collected:  06/12/16 0051    Specimen:  Blood Updated:  06/12/16 0131     Magnesium 1.8 mg/dL     GFR [[id number]] Collected:  06/12/16 0051     Updated:  06/12/16 0131     EGFR 20.5    Glucose Whole Blood - POCT [[id number]]  (Abnormal) Collected:  06/11/16 1209     Updated:  06/11/16 1212     POCT - Glucose Whole blood 125 (H) mg/dL           IMAGING DATA:  Radiology Results (24 Hour)     ** No results found for the last 24 hours. **

## 2016-06-12 NOTE — Progress Notes (Signed)
MEDICINE PROGRESS NOTE    Date Time: 06/12/16 7:43 AM  Patient Name: Gregory [name] T  Attending Physician: Pola Corn, MD    Assessment:   Principal Problem:    Acute on chronic combined systolic and diastolic heart failure  Active Problems:    Bilateral lower extremity edema    Light chain (AL) amyloidosis    Chronic renal failure, stage 3 (moderate)    Cardiac amyloidosis    NICM (nonischemic cardiomyopathy)  Resolved Problems:    * No resolved hospital problems. *       A 70 year old [place] American male with PMH of NICM w/EF 20-25% secondary to AL Amyloidosis on palliative Milrinone gtt, CKD, HTN, Asthma [facility] for report of increased fatigue and weight gain on current home regimen requiring IV diuresis.     Plan:   - NICM in the setting of AL amyloidosis   Continue Palliative Milrinone at 0.375 mcg/kg/min  Continue Bumex gtt to 1 mg/hr   Continue Diuril 500 mg IV BID, patient did not respond to Metolazone per [facility]   Monitor I&O closely   Check weight daily   Replete electrolytes as necessary to keep K>4, Mg>2  [facility] following   Continue ASA, Aldactone     - asymptomatic hypotension   continue Midodrine to 10 mg TID   BP stable     - AKI on CKD (baseline Cr 2.9)  Cr 3.6   Nephrology following      - Acute gout flare up   completed Prednisone 10 mg daily for 3 days     Heparin SC for DVT prophylaxis     Appreciate Palliative team consultation to discuss goals of care. patient declines palliative measures. Notified oncology (Dr Oneal Grout) about [facility] available via care everywhere yesterday      Case discussed with: Dr Frederick Peers, patient and RN   Safety Checklist:     DVT prophylaxis:  CHEST guideline (See page [id number]) Chemical   Foley:  [place] Rn Foley protocol Not present   IVs:  Peripheral IV   PT/OT: Not needed   Daily CBC & or Chem ordered:  SHM/ABIM guidelines (see #5) Yes, due to clinical and lab instability   Reference for approximate  charges of common labs: CBC auto diff - $76  BMP - $99  Mg - $79    Lines:     Patient Lines/Drains/Airways Status    Active PICC Line / CVC Line / PIV Line / Drain / Airway / Intraosseous Line / Epidural Line / ART Line / Line / Wound / Pressure Ulcer / NG/OG Tube     Name:   Placement date:   Placement time:   Site:   Days:    PICC Single Lumen 05/17/16 Right  05/17/16            20    Peripheral IV 06/05/16 Left Forearm  06/05/16    1449    Forearm    less than 1    Peripheral IV 06/05/16 Right Forearm  06/05/16    1435    Forearm    less than 1    Surgical Airway  05/04/16            33                 Disposition: (Please see PAF column for Expected D/C Date)   Today's date: 06/12/2016  Admit Date: 06/05/2016 11:36 AM  LOS: 7  Clinical Milestones: Bumex gtt for diuresis   Anticipated discharge needs: none       Subjective   CC: Acute on chronic combined systolic and diastolic heart failure    Interval History/24 hour events: IV Bumex and Milrinone gtt     HPI/Subjective: anxious about discharge, wants to go home. No report of CP or SOB  Review of Systems:     As per HPI    Physical Exam:     VITAL SIGNS PHYSICAL EXAM   Temp:  [96.8 F (36 C)-98.1 F (36.7 C)] 97 F (36.1 C)  Heart Rate:  [63-113] 96  Resp Rate:  [16-20] 20  BP: (83-108)/(51-72) 108/70  Blood Glucose:    Telemetry: SR      Intake/Output Summary (Last 24 hours) at 06/12/16 0743  Last data filed at 06/12/16 2505   Gross per 24 hour   Intake          1103.68 ml   Output             1700 ml   Net          -596.32 ml    Physical Exam  General: awake, alert X 3  Cardiovascular: regular rate and rhythm, no murmurs, rubs or gallops  Lungs: clear to auscultation bilaterally, without wheezing, rhonchi, or rales  Abdomen: soft, non-tender, non-distended; no palpable masses,  normoactive bowel sounds  Extremities: BLE pitting edema       Meds:     Medications were reviewed:  Current Facility-Administered Medications   Medication Dose Route Frequency    . aspirin EC  81 mg Oral Daily   . chlorothiazide  500 mg Intravenous BID   . famotidine  20 mg Oral Q24H [place]   . heparin (porcine)  5,000 Units Subcutaneous Q8H [place]   . influenza  0.5 mL Intramuscular Once   . midodrine  10 mg Oral TID MEALS   . potassium chloride  40 mEq Oral Daily   . spironolactone  50 mg Oral Daily     Current Facility-Administered Medications   Medication Dose Route Frequency Last Rate   . bumetanide (BUMEX) infusion  1 mg/hr Intravenous Continuous 1 mg/hr (06/11/16 2309)   . milrinone  0.375 mcg/kg/min Intravenous Continuous 0.375 mcg/kg/min (06/12/16 0450)     Current Facility-Administered Medications   Medication Dose Route   . naloxone  0.2 mg Intravenous   . ondansetron  4 mg Oral    Or   . ondansetron  4 mg Intravenous   . prochlorperazine  10 mg Oral         Labs:     Labs (last 72 hours):      Recent Labs  Lab 06/06/16  0353 06/05/16  1432   WBC 5.54 5.89   Hgb 11.3* 12.0*   Hematocrit 33.1* 35.8*   Platelets 143 145            Recent Labs  Lab 06/12/16  0051 06/11/16  0412  06/05/16  1432   Sodium 131* 132* More results in Results Review 131*   Potassium 4.3 4.0 More results in Results Review 4.0   Chloride 91* 93* More results in Results Review 89*   CO2 25 24 More results in Results Review 24   BUN 101.0* 101.0* More results in Results Review 88.0*   Creatinine 3.6* 3.5* More results in Results Review 3.9*   Calcium 9.7 9.5 More results in Results Review 9.9   Albumin  --   --   --  3.4*   Protein, Total  --   --   --  6.8   Bilirubin, Total  --   --   --  3.6*   Alkaline Phosphatase  --   --   --  155*   ALT  --   --   --  19   AST (SGOT)  --   --   --  35*   Glucose 104* 107* More results in Results Review 153*   More results in Results Review = values in this interval not displayed.                Imaging, reviewed and are significant for:  Picc Line Check/change    Result Date: 05/25/2016   Technically successful placement of a single lumen Power Line via the right internal  jugular vein as described above. Dimitrios  Papadouris, MD 05/25/2016 10:30 PM         Signed by: Alyson Locket, NP   Medicine Hospitalist Nurse Practitioner   Spectra#: [phone]

## 2016-06-12 NOTE — Progress Notes (Signed)
Paged Chew[place], Marisa Severin, MD to sign home health FTF.

## 2016-06-12 NOTE — Progress Notes (Signed)
PROGRESS NOTE    Date Time: 06/12/16 6:39 PM  Patient Name: [name],[name]                                NEPHROLOGY PROGRESS NOTE    Follow up for acute renal failure  Subjective:   Patient states his legs are feeling better    Medications:      Scheduled Meds: PRN Meds:      aspirin EC 81 mg Oral Daily   bumetanide 6 mg Oral BID   famotidine 20 mg Oral Q24H [place]   heparin (porcine) 5,000 Units Subcutaneous Q8H [place]   influenza 0.5 mL Intramuscular Once   [START ON 06/13/2016] metOLazone 5 mg Oral Daily   midodrine 10 mg Oral TID MEALS   spironolactone 50 mg Oral Daily       Continuous Infusions:  . milrinone 0.375 mcg/kg/min (06/12/16 1305)      naloxone 0.2 mg PRN   ondansetron 4 mg Q6H PRN   Or     ondansetron 4 mg Q6H PRN   prochlorperazine 10 mg Q6H PRN         Review of Systems:    No complaints of chest pain, palpitations shortness of breath, cough, headaches, dizziness, joint pain, abdominal pain, nausea, decreased appetite, or fevers.     Physical Exam:   Patient Vitals for the past 24 hrs:   BP Temp Temp src Pulse Resp SpO2 Weight   06/12/16 1701 98/63 - - 98 17 96 % -   06/12/16 1455 110/66 - - (!) 104 - - -   06/12/16 1210 91/61 (!) 96.7 F (35.9 C) Oral - 18 100 % -   06/12/16 1004 91/54 - - (!) 101 - - -   06/12/16 0814 94/56 (!) 96.4 F (35.8 C) - (!) 109 18 100 % -   06/12/16 0700 - - - - - - 63.9 kg (140 lb 12.8 oz)   06/12/16 0456 108/70 97 F (36.1 C) Oral 96 20 97 % -   06/11/16 2333 100/72 - - (!) 108 - - -   06/11/16 2255 (!) 83/51 97.4 F (36.3 C) Oral 63 19 93 % -   06/11/16 1841 (!) 89/63 (!) 96.8 F (36 C) Oral (!) 103 19 98 % -       Intake and Output Summary (Last 24 hours) at Date Time    Intake/Output Summary (Last 24 hours) at 06/12/16 1839  Last data filed at 06/12/16 1458   Gross per 24 hour   Intake           508.94 ml   Output             2250 ml   Net         -1741.06 ml       General appearance - alert, well appearing, and in no distress  Mental status - alert, oriented to  person, place, and time  Chest - clear to auscultation, no wheezes, rales or rhonchi, symmetric air entry  Heart - S1 and S2 normal  Abdomen - soft, no tenderness  Neurological - alert, oriented, normal speech, no focal findings or movement disorder noted  Extremities - pedal edema 2 +, no clubbing or cyanosis    Labs:     Recent Labs  Lab 06/06/16  0353   WBC 5.54   RBC 3.40*   Hgb 11.3*   Hematocrit  33.1*   MCV 97.4   MCHC 34.1   RDW 15   MPV 10.7   Platelets 143           Recent Labs  Lab 06/12/16  0051 06/11/16  0412 06/09/16  0554 06/08/16  0526  06/07/16  0506   Sodium 131* 132* 130* 129*  --  130*   Potassium 4.3 4.0 3.4* 3.3*  --  3.2*   Chloride 91* 93* 89* 88*  --  88*   CO2 _0 --  24   BUN 101.0* 101.0* 100.0* 95.0*  --  94.0*   Creatinine 3.6* 3.5* 3.3* 3.3*  --  3.7*   Glucose 104* 107* 126* 176*  --  104*   Calcium 9.7 9.5 9.2 8.9  --  9.1   Magnesium 1.8 1.6 1.9 2.1 More results in Results Review  --    More results in Results Review = values in this interval not displayed.      Results     Procedure Component Value Units Date/Time    Basic Metabolic Panel [[id number]]  (Abnormal) Collected:  06/12/16 0051    Specimen:  Blood Updated:  06/12/16 0131     Glucose 104 (H) mg/dL      BUN 101.0 (H) mg/dL      Creatinine 3.6 (H) mg/dL      Calcium 9.7 mg/dL      Sodium 131 (L) mEq/L      Potassium 4.3 mEq/L      Chloride 91 (L) mEq/L      CO2 25 mEq/L     Magnesium [[id number]] Collected:  06/12/16 0051    Specimen:  Blood Updated:  06/12/16 0131     Magnesium 1.8 mg/dL     GFR [[id number]] Collected:  06/12/16 0051     Updated:  06/12/16 0131     EGFR 20.5            Rads:   Radiological Procedure reviewed.    Signed by: [name], MD    Assessment:   1) Renal: AKI on CKD, related to cardio-renal syndrome and progression of amyloid kidney. I agree with switching to PO bumex 6 bid, aldactone, zaroxolyn    2) Hypotension: continue midodrine    3) Hyponatremia: related to volume overload. Continue  diuretics.       Plan:   No indication for dialysis  Continue current diuretics  Continue midodrine  Continue to monitor blood pressure      [name], MD  [facility]  Spectra# [phone]  Pg# [phone]  Office # [phone]  After 430 PM, please call the office for person on call  On Weekends, call the office for the person on call

## 2016-06-12 NOTE — Plan of Care (Signed)
Problem: Safety  Goal: Patient will be free from injury during hospitalization  Outcome: Progressing   06/12/16 0111   Goal/Interventions addressed this shift   Patient will be free from injury during hospitalization  Assess patient's risk for falls and implement fall prevention plan of care per policy;Provide and maintain safe environment;Provide alternative method of communication if needed (communication boards, writing)       Comments: Pt a&ox4, f/c, mae. OOB with 1 person assist. SR/ST on tele, HR 90-110s, SBP 90-120s, afebrile. SpO2>95%on R/A. ACE wraps to BLE  Applied. Bumex gtt [place]'ed. Milrinone 0.331mg/kg/hr via R chest cath. Dressing to R chest cath c/d/i. Pt denies any pain. Voids to urinal, urine yellow/clear. Pt refuses fall precautions including fall mat and bed alarm. Plan: continue monitoring for any acute changes. Awaiting possible [place] home tomorrow.

## 2016-06-12 NOTE — Plan of Care (Addendum)
COMMENT:  Alert and OX4; Able to make needs known.  Denies chest pain or SOB;  ST on monitor low 100's;  BP at MN was low at 83/51; NP DAlge aware; No new intervention done;  PT asymptomatic; Repeat BP better at 100/72  Continue with Bumex drip at 25m/hr and Milrinone at 0.3719m/kg/min;  PT wants to keep the acewraps tonight;   Continue to monitor,        Problem: Heart Failure  Goal: Stable vital signs and fluid balance  Outcome: Progressing   06/12/16 0111   Goal/Interventions addressed this shift   Stable vital signs and fluid balance Monitor/assess vital signs and telemetry per unit protocol;Weigh on admission and record weight daily;Assess signs and symptoms associated with cardiac rhythm changes;Monitor intake/output per unit protocol and/or LIP order;Monitor lab values;Monitor for leg swelling/edema and report to LIP if abnormal       Problem: Safety  Goal: Patient will be free from injury during hospitalization  Outcome: Progressing   06/12/16 0111   Goal/Interventions addressed this shift   Patient will be free from injury during hospitalization  Assess patient's risk for falls and implement fall prevention plan of care per policy;Provide and maintain safe environment;Provide alternative method of communication if needed ([employer]writing)   Fall and safety precaution maintained; encouraged use of call bell for needs; pt refusing falls intervention like the falls mat and bed alarm; Chargr RN aware; continue with purposeful rounding;  Goal: Patient will be free from infection during hospitalization  Outcome: Progressing

## 2016-06-12 NOTE — Progress Notes (Signed)
CM received call from [place] re family education on Milrinone. Patient states wife has left for the day and she has visitors at home, son has the flu and daughter working till 10:00 pm daily. Plan going forward - Wife will come to the hospital at 3:00 pm tomorrow for education. Medication has been delivered and is in the med room. Caregivers will see patient in the home setting on Friday.    Army Chaco  Clinical Case Manager RN  [facility]  [phone]

## 2016-06-12 NOTE — Progress Notes (Signed)
Advanced Heart Failure and Transplant Progress Note    Heart Failure/Transplant Spectra Link [phone]    Assessment:   Non-ischemiccardiomyopathy, EF 20-25%, NYHA Class 4, ACC/AHA Stage D   AL cardiac amyloidosis diagnosed December 2015   Initial response to CyBorD therapy; however, patient has relapsed and started revlimid 5 mg PO QOD and dexamethasone 20 mg weekly in October 2017   Acute on chronic systolic and diastolicheart failure   Low cardiac output   Restrictive cardiac physiology   Acute on chronic renal failure, stage V   Hypokalemia   Hyponatremia    Mild thrombocytopenia   Chronic gout   Overall poor prognosis   Patient has declined palliative measures at this juncture    Recommendations:   Continue milrinone 0.375 mcg/kg/min   Change bumetanide to 6 mg PO daily at 0800 and 1400   Discontinue diuril and resume metolazone 5 mg PO QD   Fluid restrict to 1.5 L/day and Na 2 gm/day   Daily standing weights   No beta blocker due to relative hypotension   No ACE/ARB/ARNI due to severity of renal failure and relative hypotension   Continue spironolactone 50 mg QD   Patient's oncology records from [facility] are in [place] in [facility]   Activity as tolerated   ACE wrap feet and lower legs daily in AM and unwrap at night   Face to face for home milrinone completed 06/12/16   Follow-up in the Advanced Heart Failure clinic on 06/20/16 at 2:00 PM with [name] NP   Anticipate discharge to home on 06/13/16      ---------------------------------------------------------------------------------------------------------  Subjective:  Patient resting in bed, elevating feet. He notes improvement in his edema since starting the diuril, which resulted in significant urine output. He denies dyspnea, orthopnea/PND or abdominal symptoms. Reviewed with bedside RN and Dr Frederick Peers.    Objective:    Vital signs in last 24 hours:   Temp:  [96.4 F (35.8 C)-98.1 F (36.7 C)] 96.4 F (35.8  C)  Heart Rate:  [63-109] 109  Resp Rate:  [16-20] 18  BP: (83-108)/(51-72) 94/56  SpO2: 100 %O2 Device: None (Room air)  Height: 165.1 cm (_0 )  Weight: 63.9 kg (140 lb 12.8 oz); Weight change: 0.136 kg (4.8 oz)  Body mass index is 23.43 kg/m.[name]    Telemetry: sinus tachycardia at 107 BPM, PVCs    I/O last 3 completed shifts:  In: 1229.76 [P.O.:803; I.V.:426.76]  Out: 1930 [PFXTK:2409]                       Drips:  . bumetanide (BUMEX) infusion 1 mg/hr (06/12/16 0811)   . milrinone 0.375 mcg/kg/min (06/12/16 0450)     Physical Exam:   General: alert, appears stated age, cooperative and no distress  HEENT: Conjunctiva pale, bucal mucosa moist. JVP 10 cm with large v waves, no adenopathy, no carotid bruit and supple, symmetrical, trachea midline.   Lungs: clear to auscultation bilaterally  Cardiovascular: regular rate and rhythm, S1, S2 normal  Abdominal: soft, non-tender; bowel sounds normal; no masses, no organomegaly. Hepatojular reflux ispresent.  Extremities: edema 1-2+ pitting edema of the legs bilaterally. Extremities are warmto the touch. legs are wrapped to knees.  Neuromuscular exam: Grossly non-focal though not formally tested. Normalmuscle tone. Patient able toperform PT and OT.    Cardiac Studies:  Echocardiogramon 11/18/17shows a left ventricular end-diastolic dimension of 73ZH, concentric LVH (walls 15 mm), global HKwith a left ventricular ejection fraction of 20-25%, and  grade 3diastolic dysfunction. The right ventricle is dilated with reduced systolic function. The left atrium isdilated and the right atrium isdilated. The aortic valve is trileaflet withoutevidence of stenosis; there is trivialinsufficiency. The mitral valve is morphologically normal with mildregurgitation. There is mildtricuspid regurgitation. Pulmonary systolic pressure is estimated to be 51mHg above right atrial pressure (10-142mg). There is trivialpulmonic insufficiency. A pericardial  effusion is notpresent.    ECG Findingson 05/03/16: sinus rhythm, left anterior fascicular block and inferior MI, low voltage. As compared to hisprevious ECG on 11/1/17there is nono significant change.     Right heart catheterization on 05/07/16 showed a RA pressure of 8 mmHg, RV pressure of 24/8 mmHg, PA pressure of 29/9/16 mmHg, and a PW pressure of 13 mmHg.  PA saturation was 45.8%.  Cardiac output by Fick calculation was 1.9 L/min with a corresponding index of 1.2 L/min/m2.   Cardiac output by thermodilution was 1.6 L/min with a corresponding index of 0.95 L/min/m2.  PVR is 1.6 WU.    Imaging:  Chest X-Ray: none      aspirin EC 81 mg Oral Daily   chlorothiazide 500 mg Intravenous BID   famotidine 20 mg Oral Q24H [place] heparin (porcine) 5,000 Units Subcutaneous Q8H [place] influenza 0.5 mL Intramuscular Once   midodrine 10 mg Oral TID MEALS   potassium chloride 40 mEq Oral Daily   spironolactone 50 mg Oral Daily     Lab Results   Component Value Date    WBC 5.54 06/06/2016    HGB 11.3 (L) 06/06/2016    PLT 143 06/06/2016    NA 131 (L) 06/12/2016    K 4.3 06/12/2016    BUN 101.0 (H) 06/12/2016    CREAT 3.6 (H) 06/12/2016    EGFR 20.5 06/12/2016    MG 1.8 06/12/2016    AST 35 (H) 06/05/2016    ALB 3.4 (L) 06/05/2016    INR 1.3 (H) 04/17/2016    BNP 4,994 (H) 06/05/2016

## 2016-06-13 LAB — CBC
Absolute NRBC: 0 10*3/uL
Hematocrit: 37.5 % — ABNORMAL LOW (ref 42.0–52.0)
Hgb: 12.6 g/dL — ABNORMAL LOW (ref 13.0–17.0)
MCH: 33.1 pg — ABNORMAL HIGH (ref 28.0–32.0)
MCHC: 33.6 g/dL (ref 32.0–36.0)
MCV: 98.4 fL (ref 80.0–100.0)
MPV: 10.2 fL (ref 9.4–12.3)
Nucleated RBC: 0 /100 WBC (ref 0.0–1.0)
Platelets: 188 10*3/uL (ref 140–400)
RBC: 3.81 10*6/uL — ABNORMAL LOW (ref 4.70–6.00)
RDW: 15 % (ref 12–15)
WBC: 5.14 10*3/uL (ref 3.50–10.80)

## 2016-06-13 LAB — BASIC METABOLIC PANEL
BUN: 101 mg/dL — ABNORMAL HIGH (ref 9.0–28.0)
CO2: 27 mEq/L (ref 22–29)
Calcium: 10.5 mg/dL (ref 8.5–10.5)
Chloride: 89 mEq/L — ABNORMAL LOW (ref 100–111)
Creatinine: 3.3 mg/dL — ABNORMAL HIGH (ref 0.7–1.3)
Glucose: 108 mg/dL — ABNORMAL HIGH (ref 70–100)
Potassium: 4.1 mEq/L (ref 3.5–5.1)
Sodium: 133 mEq/L — ABNORMAL LOW (ref 136–145)

## 2016-06-13 LAB — GFR: EGFR: 22.6

## 2016-06-13 LAB — MAGNESIUM: Magnesium: 1.8 mg/dL (ref 1.6–2.6)

## 2016-06-13 MED ORDER — FAMOTIDINE 20 MG PO TABS
20.0000 mg | ORAL_TABLET | Freq: Every evening | ORAL | 0 refills | Status: DC | PRN
Start: 2016-06-13 — End: 2016-07-11

## 2016-06-13 MED ORDER — MIDODRINE HCL 10 MG PO TABS
10.0000 mg | ORAL_TABLET | Freq: Three times a day (TID) | ORAL | 0 refills | Status: DC
Start: 2016-06-13 — End: 2016-06-20

## 2016-06-13 MED ORDER — SPIRONOLACTONE 50 MG PO TABS
50.0000 mg | ORAL_TABLET | Freq: Every day | ORAL | 0 refills | Status: DC
Start: 2016-06-13 — End: 2016-06-20

## 2016-06-13 NOTE — Plan of Care (Signed)
Problem: Pain  Goal: Pain at adequate level as identified by patient  Outcome: Progressing  Pt reports 0/10 pain at this time. Appears comfortable and stable with no signs of distress. Vitals remain stable. Will continue to monitor and anticipate issues related to pain. Hourly rounding will continue to ensure all patient needs are met.  Poss d/c home.     06/13/16 0336   Goal/Interventions addressed this shift   Pain at adequate level as identified by patient Identify patient comfort function goal;Assess for risk of opioid induced respiratory depression, including snoring/sleep apnea. Alert healthcare team of risk factors identified.;Assess pain on admission, during daily assessment and/or before any "as needed" intervention(s);Reassess pain within 30-60 minutes of any procedure/intervention, per Pain Assessment, Intervention, Reassessment (AIR) Cycle;Evaluate patient's satisfaction with pain management progress;Evaluate if patient comfort function goal is met;Offer non-pharmacological pain management interventions;Consult/collaborate with Pain Service;Consult/collaborate with Physical Therapy, Occupational Therapy, and/or Speech Therapy

## 2016-06-13 NOTE — Progress Notes (Signed)
Advanced Heart Failure and Transplant Progress Note    Heart Failure/Transplant Spectra Link [phone]    Assessment:   Non-ischemiccardiomyopathy, EF 20-25%, NYHA Class 4, ACC/AHA Stage D   AL cardiac amyloidosis diagnosed December 2015   Initial response to CyBorD therapy; however, patient has relapsed and started revlimid 5 mg PO QOD and dexamethasone 20 mg weekly in October 2017   Acute on chronic systolic and diastolicheart failure   Low cardiac output   Restrictive cardiac physiology   Acute on chronic renal failure, stage V   Hypokalemia   Hyponatremia    Mild thrombocytopenia   Chronic gout   Overall poor prognosis   Patient has declined palliative measures at this juncture    Recommendations:   Continue milrinone 0.375 mcg/kg/min   Continue bumetanide 6 mg PO daily at 0800 and 1400   Continue metolazone 5 mg PO QD   Fluid restrict to 1.5 L/day and Na 2 gm/day   Daily standing weights   No beta blocker due to relative hypotension   No ACE/ARB/ARNI due to severity of renal failure and relative hypotension   Continue spironolactone 50 mg QD   Patient's oncology records from [facility] are in [place] in [place]; follow-up with [facility] as an outpatient   Activity as tolerated   ACE wrap feet and lower legs daily in AM and unwrap at night   Face to face for home milrinone completed 06/12/16   Follow-up in the Advanced Heart Failure clinic on 06/20/16 at 2:00 PM with [name] NP   Discharge to home today      ---------------------------------------------------------------------------------------------------------  Subjective:  Patient reports doing well overnight; he has been transitioning and walking in the room without assistance. His leg edema has improved though has not resolved. He denies orthopnea, PND, dyspnea or abdominal symptoms. He is anxious to get home. He will have teaching with the home infusion company today at 3:00 as he is getting a new infusion  pump. Discussed with bedside RN and Dr Frederick Peers.    His weight today is 136 lbs on a standing scale, down from 140 lbs yesterday.    Objective:    Vital signs in last 24 hours:   Temp:  [96.7 F (35.9 C)-97.3 F (36.3 C)] 97.2 F (36.2 C)  Heart Rate:  [98-114] 111  Resp Rate:  [17-20] 20  BP: (91-119)/(59-75) 101/59  SpO2: 94 %O2 Device: None (Room air)  Height: 165.1 cm (_0 )  Weight: 63.9 kg (140 lb 12.8 oz); Weight change:   Body mass index is 23.43 kg/m.[name]    Telemetry: sinus tachycardia at 110 BPM    I/O last 3 completed shifts:  In: 508.94 [P.O.:300; I.V.:208.94]  Out: 2950 [Urine:2950]                       Drips:  . milrinone 0.375 mcg/kg/min (06/13/16 0211)     Physical Exam:   General: alert, appears stated age, cooperative and no distress  HEENT: Conjunctiva pale, bucal mucosa moist. JVP 10 cm with large v waves, no adenopathy, no carotid bruit and supple, symmetrical, trachea midline.   Lungs: clear to auscultation bilaterally  Cardiovascular: regular rate and rhythm, S1, S2 normal  Abdominal: soft, non-tender; bowel sounds normal; no masses, no organomegaly. Hepatojular reflux ispresent.  Extremities: edema 1-2+ pitting edema of the legs bilaterally. Extremities are warmto the touch. legs are wrapped to knees.  Neuromuscular exam: Grossly non-focal though not formally tested. Normalmuscle tone.  Patient able toperform PT and OT.    Cardiac Studies:  Echocardiogramon 11/18/17shows a left ventricular end-diastolic dimension of 16XW, concentric LVH (walls 15 mm), global HKwith a left ventricular ejection fraction of 20-25%, and grade 3diastolic dysfunction. The right ventricle is dilated with reduced systolic function. The left atrium isdilated and the right atrium isdilated. The aortic valve is trileaflet withoutevidence of stenosis; there is trivialinsufficiency. The mitral valve is morphologically normal with mildregurgitation. There is mildtricuspid regurgitation.  Pulmonary systolic pressure is estimated to be 34mHg above right atrial pressure (10-131mg). There is trivialpulmonic insufficiency. A pericardial effusion is notpresent.    ECG Findingson 05/03/16: sinus rhythm, left anterior fascicular block and inferior MI, low voltage. As compared to hisprevious ECG on 11/1/17there is nono significant change.     Right heart catheterization on 05/07/16 showed a RA pressure of 8 mmHg, RV pressure of 24/8 mmHg, PA pressure of 29/9/16 mmHg, and a PW pressure of 13 mmHg.  PA saturation was 45.8%.  Cardiac output by Fick calculation was 1.9 L/min with a corresponding index of 1.2 L/min/m2.   Cardiac output by thermodilution was 1.6 L/min with a corresponding index of 0.95 L/min/m2.  PVR is 1.6 WU.    Imaging:  Chest X-Ray: none      aspirin EC 81 mg Oral Daily   bumetanide 6 mg Oral BID   famotidine 20 mg Oral Q24H [place] heparin (porcine) 5,000 Units Subcutaneous Q8H [place] influenza 0.5 mL Intramuscular Once   metOLazone 5 mg Oral Daily   midodrine 10 mg Oral TID MEALS   spironolactone 50 mg Oral Daily     Lab Results   Component Value Date    WBC 5.54 06/06/2016    HGB 11.3 (L) 06/06/2016    PLT 143 06/06/2016    NA 133 (L) 06/13/2016    K 4.1 06/13/2016    BUN 101.0 (H) 06/13/2016    CREAT 3.3 (H) 06/13/2016    EGFR 22.6 06/13/2016    MG 1.8 06/13/2016    AST 35 (H) 06/05/2016    ALB 3.4 (L) 06/05/2016    INR 1.3 (H) 04/17/2016    BNP 4,994 (H) 06/05/2016

## 2016-06-13 NOTE — Plan of Care (Signed)
Pt ready for discharge. Discharge instructions given to pt with family at bedside. Pt denied further questions and verbalized understand. Milrinone connected by infusing company. CVC C/D/I. Tele d/c. Pt to pick up meds from local [facility]. Refused flu vaccine prior to discharge. Pt sent with all belongings. Wheeled down to car by Therapist, sports. Pt needs met.

## 2016-06-13 NOTE — Progress Notes (Signed)
June 13, 2016.     Gregory [name] is a 70 year old gentleman seen in consultation by Dr. Joneen Caraway  with history of amyloidosis with cardiac involvement.       He has been hospitalized for heart failure and is planned for discharge to  home today.  I will speak with individuals in our office to make  arrangements for his followup with Dr. Joneen Caraway.

## 2016-06-13 NOTE — Discharge Instr - AVS First Page (Addendum)
[facility] - Discharge Instructions    I was hospitalized because: cardiac amyloidosis and volume overload.     The next medical provider I need to see is: Dr. Skeet Simmer [hematology] in 2 days, Dr. Werner Lean [cardiology] in one week, Dr. Caleen Jobs in 3-5 days for hospital follow up.     I need to seek medical attention if:  Chest pain, shortness of breath, dizziness, lightheadedness, fever above 101.1, bleeding from nose or mouth.     Diet: resume diet with less than 2 grams salt daily and less than 1582m of fluid daily.     *CONTINUE STANDING DAILY WEIGHTS AFTER YOU EMPTY YOUR BLADDER AND BEFORE BREAKFAST. CALL [facility] IF MORE THAN 3 POUNDS WEIGHT GAIN IN ONE DAY OR MORE THAN 5 POUNDS WEIGHT GAIN IN ONE WEEK*    If you have any urgent concerns or questions about your care that cannot be addressed by your primary care provider or the provider listed above, please contact the physician listed below:    Your attending physician (at discharge): Gregory [name], Gregory [name]  H[facility]ist, [facility] [phone]        Discharge Instructions: Taking Fast-Acting Nitroglycerin  Your healthcare provider prescribed nitroglycerin for you. This medicine relieves chest pain caused by a lack of blood flow to the heart (angina) by getting more oxygen-rich blood to your heart. Fast-acting nitroglycerin can stop an angina attack. Follow the steps below for taking fast-acting nitroglycerin. Note: Your healthcare provider may give you slightly different instructions. If so, follow them carefully.  The name of my fast-acting nitroglycerin medicine is ________________________________.   To stop an angina attack  Sit down before you take your nitroglycerin. The medicine may make you feel dizzy because it lowers blood pressure rapidly.  Using fast-acting tablets   Place1 tablet under your tongue. You can also place it between your lip and gum, or between your cheek and gum.   Let the tablet  dissolve completely. Don't swallow or chew the tablet.   As the tablet is dissolving, do not eat or drink anything, or smoke or chew tobacco.  Using fast-acting spray   Open your mouth and hold the sprayer just in front of your mouth.   Press the button on the top. Spray once on or under your tongue. Do not inhale.   Close your mouth. Then wait a few seconds before you swallow.  After taking 1 tablet or spraying once   Continue sitting for5 minutes.   If the angina goes away completely, rest for a while and continue your normal routine.  Call 911 if your angina lasts longer than5 minutes and1 tablet or 1 spray has not relieved it. Don't delay. You may be having a heart attack (acute myocardial infarction, or AMI)!  After you call 911, take a second tablet. Or, spray a second time. Wait another 5 minutes. If the angina still does not go away, take a third tablet, or spray a third time. Don't take more than 3 tablets, or spray more than 3 times, within 15 minutes. Stay on the phone with 911 for further instructions.   Precautions   Limit the amount of alcohol you drink. Too much alcohol can cause dizziness or fainting.   Don'ttake phosphodiesterase inhibitors, such as sildenafil. These are medicines used to treat sexual dysfunction in men--at any time if you are on nitroglycerin treatment. The combination of nitroglycerin with these medicines can cause a severe drop in blood pressure.  This can lead to dizziness, fainting, heart attack, or stroke.   Check the expiration date. Nitroglycerin can lose its effectiveness over time.   Tell your healthcare provider if your angina attacks last longer, occur more often, or are more severe.  Possible side effects of nitroglycerin  If you have any of these side effects, tell your healthcare provider right away. But don't stop taking the medicine until your doctor tells you to. Mild side effects include:   More gas (flatulence) than  normal   Bloating   Nausea   Hair loss   Decreased appetite   Weight loss   Headache   Dizziness   Flushing (redness of the face, neck, or chest)  When to call your healthcare provider  Call your healthcare provider right away if you have any of the following:   Severe headache   Severe dizziness, or fainting   Nausea or vomiting   Fast heartbeat (higher than 100 beats per minute)   Swollen ankles   Weakness   Angina attacks that last longer, occur more often, or are more severe than in the past or occur at rest   Date Last Reviewed: 10/16/2014   2000-2017 [employer]. [address] [place] [address]. All rights reserved. This information is not intended as a substitute for professional medical care. Always follow your healthcare professional's instructions.

## 2016-06-13 NOTE — Progress Notes (Signed)
Hospital Bed   Paged [place], Marisa Severin, MD to sign home health FTF.

## 2016-06-13 NOTE — Discharge Summary (Addendum)
Discharge Summary    Date:06/13/2016   Patient Name: Gregory [name]  Attending Physician: Pola Corn, MD    Date of Admission:   06/05/2016    Date of Discharge:   06/13/16    Admitting Diagnosis:   Acute on chronic combined heart failure.     Discharge Dx:     Principal Diagnosis (Diagnosis after study, that is chiefly responsible for admission to inpatient status): Acute on chronic combined systolic and diastolic heart failure  Active Hospital Problems    Diagnosis POA   . Principal Problem: Acute on chronic combined systolic and diastolic heart failure Yes   . NICM (nonischemic cardiomyopathy) Yes   . Cardiac amyloidosis Yes   . Chronic renal failure, stage 3 (moderate) Yes   . Light chain (AL) amyloidosis Yes   . Bilateral lower extremity edema Yes      Resolved Hospital Problems    Diagnosis POA   No resolved problems to display.       Treatment Team:   Treatment Team:   Attending Provider: Pola Corn, MD  Consulting Physician: [name], MD  Consulting Physician: [name], MD  Consulting Physician: [name], MD     Procedures performed:   Radiology: all results from this admission  Picc Line Check/change    Result Date: 05/25/2016   Technically successful placement of a single lumen Power Line via the right internal jugular vein as described above. Dimitrios  Papadouris, MD 05/25/2016 10:30 PM     Surgery: all results from this admission  * No surgery found [facility] Hospital Course:   A 70 year old African American male with PMH of NICM w/EF 20-25% secondary to AL Amyloidosis on palliative Milrinone gtt, CKD, HTN, Asthma was [facility] for report of increased fatigue and weight gain on current home regimen requiring IV diuresis. He was monitored until at base weight. Palliative care evaluated the patient however patient and his family both declined palliative care services. He will return home on milrinone at same rate as previous. Follow up with  advanced heart NP was made prior to discharge.     Cardiology recommendations on discharge:   "Continuemilrinone 0.375 mcg/kg/min   Continue bumetanide 6 mg PO daily at 0800 and 1400   Continue metolazone 5 mg PO QD   Fluid restrict to 1.5 L/day and Na 2 gm/day   Daily standing weights   No beta blocker due to relative hypotension   No ACE/ARB/ARNI due to severity of renal failure and relative hypotension   Continuespironolactone 50 mg QD   Patient's oncology records from [facility] are in [place] in [place]; follow-up with [facility] as an outpatient   Activity as tolerated   ACE wrap feet and lower legs daily in AM and unwrap at night   Face to face for home milrinone completed 06/12/16   Follow-up in the Advanced [facility] Failure clinic on 06/20/16 at 2:00 PM with [name] NP   Discharge to home today"    The patient was discharged on 100m bumetanide PO daily at 0800 and 1400 rather than 671mBID. I spoke with Dr. May and one of the coordinators is going to call tomorrow to change his dose.    Condition at Discharge:   Stable.     Today:     BP 107/65   Pulse 68   Temp 97.7 F (36.5 C) (Oral)   Resp 17   Ht 1.651  m (_0 )   Wt 63.9 kg (140 lb 12.8 oz)   SpO2 97%   BMI 23.43 kg/m      Gen: NAD, ao x 3  Heart: rrr, no murmurs or rubs.   Lungs: CTA, no wheezing or rhonchi.   Abd: soft, NT, ND, normoactive bowel sounds.   Extrems: bilateral pitting edema, pulses 2+,     Ranges for the last 24 hours:  Temp:  [96.9 F (36.1 C)-97.7 F (36.5 C)] 97.7 F (36.5 C)  Heart Rate:  [68-114] 68  Resp Rate:  [17-20] 17  BP: (98-119)/(59-75) 107/65    Last set of labs     Recent Labs  Lab 06/13/16  1258   WBC 5.14   Hgb 12.6*   Hematocrit 37.5*   Platelets 188       Recent Labs  Lab 06/13/16  0540   Sodium 133*   Potassium 4.1   Chloride 89*   CO2 27   BUN 101.0*   Creatinine 3.3*   EGFR 22.6   Glucose 108*   Calcium 10.5                   Invalid input(s): FREET4          Microbiology  Results     None          Micro / Labs / Path pending:     Unresulted Labs     None          Discharge Instructions:     Follow-up Information     [name], FNP Follow up on 06/20/2016.    Specialty:  Nurse Practitioner  Why:  follow-up in the Advanced [facility] Failure clinic on 06/20/16 at 2:00 PM  Contact information:  [address] [address]  [phone]             [name], MD. Schedule an appointment as soon as possible for a visit.    Specialties:  Medical Oncology, Hematology  Why:  hematology follow up   Contact information:  [address]  [place] [address]  [phone]             [name], [name], MD. Schedule an appointment as soon as possible for a visit in 3 day(s).    Specialty:  Internal Medicine  Why:  hospital follow up   Contact information:  [place] [address]  [phone]             [name], MD. Schedule an appointment as soon as possible for a visit in 2 day(s).    Specialties:  Medical Oncology, Internal Medicine, Hematology  Why:  Hematology follow up with review medications  Contact information:  [address]  [place] [address]  [place] - Discharge Instructions    I was hospitalized because: cardiac amyloidosis and volume overload.     The next medical provider I need to see is: Dr. Skeet Simmer [hematology] in 2 days, Dr. Werner Lean [cardiology] in one week, Dr. Caleen Jobs in 3-5 days for hospital follow up.     I need to seek medical attention if:  Chest pain, shortness of breath, dizziness, lightheadedness, fever above 101.1, bleeding from nose or mouth.     Diet: resume diet with less than 2 grams salt daily  and less than 1521m of fluid daily.     *CONTINUE STANDING DAILY WEIGHTS AFTER YOU EMPTY YOUR BLADDER AND BEFORE BREAKFAST. CALL [facility] IF MORE THAN 3 POUNDS WEIGHT GAIN IN ONE DAY OR MORE THAN 5 POUNDS WEIGHT GAIN IN ONE  WEEK*    If you have any urgent concerns or questions about your care that cannot be addressed by your primary care provider or the provider listed above, please contact the physician listed below:    Your attending physician (at discharge): Lacharles Altschuler, KMarisa Severin MD  Hospitalist, [facility] [phone]        Disposition:  Home or Self Care     Discharge Medication List      Taking    aspirin EC 81 MG EC tablet  Dose:  81 mg  Take 81 mg by mouth daily.     bumetanide 2 MG tablet  Dose:  4 mg  What changed:  how much to take  Commonly known as:  BUMEX  Take 2 tablets (4 mg total) by mouth 2 (two) times daily.     famotidine 20 MG tablet  Dose:  20 mg  Commonly known as:  PEPCID  Take 1 tablet (20 mg total) by mouth nightly as needed for Heartburn.     metOLazone 5 MG tablet  Dose:  5 mg  Commonly known as:  ZAROXOLYN  For:  Edema  Take 5 mg by mouth daily.30 minutes prior to Bumetanide (Bumex)     midodrine 10 MG tablet  Dose:  10 mg  Commonly known as:  PROAMATINE  Take 1 tablet (10 mg total) by mouth 3 (three) times daily with meals.     milrinone 20-5 MG/100ML-% infusion  Dose:  0.375 mcg/kg/min  Commonly known as:  PRIMACOR  Infuse 23.0625 mcg/min into the vein continuous.     spironolactone 50 MG tablet  Dose:  50 mg  What changed:   medication strength   how much to take  Commonly known as:  ALDACTONE  Take 1 tablet (50 mg total) by mouth daily.        STOP taking these medications    potassium chloride 20 MEQ tablet  Commonly known as:  K-DUR,KLOR-CON          Minutes spent coordinating discharge and reviewing discharge plan: see attending attestation.     I have reviewed the notes, assessments, and/or procedures performed by KDorian Podand I concure with her/his documentation of WTRYSTIAN CRISANTO      Signed by: KTori Milks PA

## 2016-06-13 NOTE — Progress Notes (Signed)
[facility]   Phone: [phone]  Fax: [phone]    Note:       Hospital bed order faxed directly to [facility]

## 2016-06-13 NOTE — Progress Notes (Signed)
Pharmacy Discharge Counseling:  Patient discharged on:    aspirin EC 81 MG EC tablet  Take 81 mg by mouth daily.     bumetanide (BUMEX) 2 MG tablet  Take 2 tablets (4 mg total) by mouth 2 (two) times daily.     famotidine (PEPCID) 20 MG tablet  Take 1 tablet (20 mg total) by mouth nightly as needed for Heartburn.     metOLazone (ZAROXOLYN) 5 MG tablet  Take 5 mg by mouth daily. 30 minutes prior to Bumetanide (Bumex)     midodrine (PROAMATINE) 10 MG tablet  Take 1 tablet (10 mg total) by mouth 3 (three) times daily with meals.     milrinone (PRIMACOR) 20-5 MG/100ML-% infusion  Infuse 23.0625 mcg/min into the vein continuous.     spironolactone (ALDACTONE) 50 MG tablet  Take 1 tablet (50 mg total) by mouth daily.       I met with patient's wife to discuss his heart failure medications (Bumetanide, Metolazone, Spironolactone, Milrinone) and his new medications (Midodrine, Famotidine).  I gave the patient's wife the chart shown below for his heart failure medications and we discussed the names of the medications, how to take them, what they do in the body, and possible side effects.  We discussed that his potassium has been stopped.    I printed out information from CareNotes on Midodrine and Famotidine and discussed it with his wife as well.  We discussed taking midodrine about 4 hours apart and not lying flat for at least 4 hours after taking a dose.  I recommended having the head of the bed elevated if the patient needs to lay down after taking a dose.  We discussed possible side effects and when to contact the patient's doctor.  We discussed using famotidine if the patient is having heartburn and that the max dose for him (due to his kidney function) is 20 mg per 24 hours.    Patient's wife reports no further questions.    Medication Name Medication Type What does it do? Possible Side Effects Important Notes   Bumetanide (Bumex) 4 mg by mouth 2 times daily    Take in the morning and afternoon    Metolazone  (Zaroxolyn) 5 mg by mouth daily 30 minutes prior to Bumetanide (Bumex) Diuretic or "water pill" Helps rid the body of excess water, which reduces swelling and may improve breathing.  Less fluid to pump means the heart does not have to work as hard. . Frequent urination  . Loss of potassium, magnesium, and/or sodium  . Low blood pressure (dizzy feeling)  . Gout  . Impotence  . Dehydration Amount you take changes depending on how much fluid is stored in your body.  Potassium supplement may be prescribed (only take one if told to do so by your healthcare provider).    Your potassium supplement has been stopped     Spironolactone (Aldactone) 50 mg by mouth daily Aldosterone Antagonist Alters hormones that are damaging to the heart, decreases strain on the heart.   . Changes in kidney function  . Low blood pressure  . Breast swelling, tenderness, or enlargement (in men and women)  . Too much potassium in the body Potassium levels need to be monitored while on this medication.  If levels are high, you may be told to avoid or limit high potassium foods.         Milrinone (Primacor)    Continuous Intravenous Infusion   Inotropic/ Vasodilator Causes the heart to beat  more strongly and relaxes the blood vessels . Headache  . Low blood pressure (dizzy feeling) Contact your healthcare professional immediately if you notice a slow, fast or irregular heartbeat and/or chest pain.  This medication is given as an intravenous infusion.       Rhys Martini, PharmD  Transitions of Care Pharmacist  [phone]

## 2016-06-13 NOTE — Progress Notes (Signed)
PROGRESS NOTE    Date Time: 06/13/16 3:51 PM  Patient Name: Gregory [name],Gregory [name]                                NEPHROLOGY PROGRESS NOTE    Follow up for AKI  Subjective:   Patient states he is doing well     Medications:      Scheduled Meds: PRN Meds:      aspirin EC 81 mg Oral Daily   bumetanide 6 mg Oral BID   famotidine 20 mg Oral Q24H [place]   heparin (porcine) 5,000 Units Subcutaneous Q8H [employer]   influenza 0.5 mL Intramuscular Once   metOLazone 5 mg Oral Daily   midodrine 10 mg Oral TID MEALS   spironolactone 50 mg Oral Daily       Continuous Infusions:  . milrinone 0.375 mcg/kg/min (06/13/16 1354)      naloxone 0.2 mg PRN   ondansetron 4 mg Q6H PRN   Or     ondansetron 4 mg Q6H PRN   prochlorperazine 10 mg Q6H PRN         Review of Systems:    No complaints of chest pain, palpitations shortness of breath, cough, headaches, dizziness, joint pain, abdominal pain, nausea, decreased appetite, or fevers.     Physical Exam:   Patient Vitals for the past 24 hrs:   BP Temp Temp src Pulse Resp SpO2   06/13/16 1154 107/65 97.7 F (36.5 C) Oral 68 17 97 %   06/13/16 0949 101/59 - - - - -   06/13/16 0805 119/74 97.2 F (36.2 C) Oral (!) 111 20 94 %   06/13/16 0520 102/68 97.3 F (36.3 C) Oral (!) 114 20 95 %   06/12/16 2300 108/67 (!) 96.9 F (36.1 C) Oral (!) 106 20 96 %   06/12/16 1958 111/75 (!) 96.9 F (36.1 C) - (!) 103 20 97 %   06/12/16 1701 98/63 - - 98 17 96 %       Intake and Output Summary (Last 24 hours) at Date Time    Intake/Output Summary (Last 24 hours) at 06/13/16 1551  Last data filed at 06/13/16 1400   Gross per 24 hour   Intake              716 ml   Output             1300 ml   Net             -584 ml       General appearance - alert, well appearing, and in no distress  Mental status - alert, oriented to person, place, and time  Neck - supple, no significant adenopathy  Neurological - alert, oriented, normal speech, no focal findings or movement disorder noted  Extremities - no clubbing or  cyanosis    Labs:     Recent Labs  Lab 06/13/16  1258   WBC 5.14   RBC 3.81*   Hgb 12.6*   Hematocrit 37.5*   MCV 98.4   MCHC 33.6   RDW 15   MPV 10.2   Platelets 188           Recent Labs  Lab 06/13/16  0540 06/12/16  0051 06/11/16  0412 06/09/16  0554 06/08/16  0526   Sodium 133* 131* 132* 130* 129*   Potassium 4.1 4.3 4.0 3.4* 3.3*   Chloride 89* 91* 93*  89* 88*   CO2 _0 BUN 101.0* 101.0* 101.0* 100.0* 95.0*   Creatinine 3.3* 3.6* 3.5* 3.3* 3.3*   Glucose 108* 104* 107* 126* 176*   Calcium 10.5 9.7 9.5 9.2 8.9   Magnesium 1.8 1.8 1.6 1.9 2.1         Results     Procedure Component Value Units Date/Time    CBC without differential [[id number]]  (Abnormal) Collected:  06/13/16 1258    Specimen:  Blood from Blood Updated:  06/13/16 1320     WBC 5.14 x10 3/uL      Hgb 12.6 (L) g/dL      Hematocrit 37.5 (L) %      Platelets 188 x10 3/uL      RBC 3.81 (L) x10 6/uL      MCV 98.4 fL      MCH 33.1 (H) pg      MCHC 33.6 g/dL      RDW 15 %      MPV 10.2 fL      Nucleated RBC 0.0 /100 WBC      Absolute NRBC 0.00 x10 3/uL     Basic Metabolic Panel [[id number]]  (Abnormal) Collected:  06/13/16 0540    Specimen:  Blood Updated:  06/13/16 0658     Glucose 108 (H) mg/dL      BUN 101.0 (H) mg/dL      Creatinine 3.3 (H) mg/dL      Calcium 10.5 mg/dL      Sodium 133 (L) mEq/L      Potassium 4.1 mEq/L      Chloride 89 (L) mEq/L      CO2 27 mEq/L     Magnesium [[id number]] Collected:  06/13/16 0540    Specimen:  Blood Updated:  06/13/16 0658     Magnesium 1.8 mg/dL     GFR [[id number]] Collected:  06/13/16 0540     Updated:  06/13/16 0658     EGFR 22.6              Rads:   Radiological Procedure reviewed.    Signed by: [name], MD    Assessment:   1) Renal: AKI, related to cardio-renal syndrome or progression of amyloid. Continue current diuretic regimen.     2) Hypotension: continue midodrine      Plan:   Continue current diuretic regimen  From a renal standpoint, he can be discharged home      [name],  MD  [facility]  Spectra# [phone]  Pg# [phone]  Office # [phone]  After 430 PM, please call the office for person on call  On Weekends, call the office for the person on call

## 2016-06-14 ENCOUNTER — Telehealth: Payer: Self-pay

## 2016-06-14 DIAGNOSIS — I5022 Chronic systolic (congestive) heart failure: Secondary | ICD-10-CM

## 2016-06-14 DIAGNOSIS — I428 Other cardiomyopathies: Secondary | ICD-10-CM

## 2016-06-14 MED ORDER — BUMETANIDE 2 MG PO TABS
6.0000 mg | ORAL_TABLET | Freq: Two times a day (BID) | ORAL | 3 refills | Status: AC
Start: 2016-06-14 — End: ?

## 2016-06-14 NOTE — Telephone Encounter (Signed)
Transitional Care Management    CM attempted to contact pt. At [phone] (home) [phone] (work)in regards to recent hospitalization and TCM enrollment. CM left message asking that patient return call to CM at [phone].     Allean Found, MSW  Case Manager  [facility]  Transitional Care Management  T [phone] F [phone]

## 2016-06-14 NOTE — Telephone Encounter (Signed)
Spoke to Mr. Schellhase wife to clarify his Bumex order. Dr. May would like him to be on Bumex 6 mg BID, not 4 mg, and metolazone 5 mg daily. She will make the correction to his pill box and call with any questions.

## 2016-06-14 NOTE — Progress Notes (Signed)
[facility]:  Name of DME Agency: [facility]  Mamie Nick) [phone]  Note: F/up with hospital bed order, spoke with Diane who said item is being delivered today

## 2016-06-18 ENCOUNTER — Encounter: Payer: Self-pay | Admitting: Registered Nurse

## 2016-06-18 NOTE — Progress Notes (Signed)
Transitional Care Management    Enrollment--    Name/Number of person who participated in call:  Patient, [phone] (home) [phone] (work)    TCM Program explained to patient and TCM contact information provided: Yes    HIPAA verification completed and notified that call is recorded:  Yes     Primary language spoken: English     Interpreter needed:  No        Health History--    Health Status (PMH, current admission summary, previous admission history):  Past Medical History:   Diagnosis Date   . Arthritis     bilat shoulders, lt knee   . Asthma without status asthmaticus     childhood   . Cardiac amyloidosis    . CHF (congestive heart failure)    . Fracture of unspecified bones     1974 lt shoulder   . Gout    . Malignant neoplasm      Patient Active Problem List   Diagnosis   . Ganglion cyst of wrist, right   . Primary localized osteoarthrosis, shoulder region   . Asthma without status asthmaticus   . Acute on chronic combined systolic and diastolic heart failure   . Elevated troponin   . Bilateral lower extremity edema   . Light chain (AL) amyloidosis   . Hyperkalemia   . Chronic renal failure, stage 3 (moderate)   . Cardiac amyloidosis   . Chronic systolic heart failure   . NICM (nonischemic cardiomyopathy)       Patient admitted to [facility] on 06/05/16     All eligible TCM Diagnoses (include A1C, EF, weight):         CHF  140lbs    Does the patient understand why he/she was admitted to the hospital:  Yes     Does the patient understand what the discharge diagnosis was: Yes     What was the patient's chief complaint upon hospital arrival: SOB       Medication Reconciliation--    Medication List:  Current Outpatient Prescriptions   Medication Sig Dispense Refill   . aspirin EC 81 MG EC tablet Take 81 mg by mouth daily.     . bumetanide (BUMEX) 2 MG tablet Take 3 tablets (6 mg total) by mouth 2 (two) times daily. 540 tablet 3   . famotidine (PEPCID) 20 MG tablet Take 1 tablet (20 mg total) by mouth nightly as  needed for Heartburn. 30 tablet 0   . metOLazone (ZAROXOLYN) 5 MG tablet Take 5 mg by mouth daily.30 minutes prior to Bumetanide (Bumex)         . midodrine (PROAMATINE) 10 MG tablet Take 1 tablet (10 mg total) by mouth 3 (three) times daily with meals. 90 tablet 0   . milrinone (PRIMACOR) 20-5 MG/100ML-% infusion Infuse 23.0625 mcg/min into the vein continuous.     [name] spironolactone (ALDACTONE) 50 MG tablet Take 1 tablet (50 mg total) by mouth daily. 60 tablet 0     No current facility-administered medications for this visit.        Were the medications reviewed with the patient:  Yes, medication reconciliation completed      Was the patient able to obtain all of his/her medications when they left the hospital:  Yes     Does the patient understand what all of his/her medications are for: Yes    Patient aware of side effects from provided teaching in hospital or pamphlets: Yes     Physician Follow Up--  Was the patient scheduled/seen by PCP or cardiologist within critical timeframe of hospital discharge: No, patient has cardiology appointment on 06/21/15. Patient's spouse states that she will call to schedule PCP follow up and oncology follow up today. CM offered assistance, patient's spouse declined. CM instructed patient's spouse to call CM if unable to schedule follow up, patient's spouse verbalized understanding. 48 hour touch completed by [facility] on 06/15/16.     Does patient understand importance of seeing a healthcare professional within critical timeframe of discharge: Yes    Name/number of PCP: [name], [name], MD, [phone]    Letter sent to PCP notifying of TCM involvement: Yes    Home Health Co-Management--    Is patient being followed by Home Health: Yes, [facility]     CHF Management--    What is patient's understanding of CHF: has had diagnosis for some time, weighs daily     Does the patient have a scale: Yes    Does the patient weigh himself/herself daily: Yes    Does the  patient understand the importance of when (in AM) s/he should be weighed and why (consistency and reporting): Yes    Has the patient's MD told him/her what his/her "dry weight" should be: Yes    What was weight since hospital discharge: 130, 131lbs    Is the patient keeping a weight log: Yes    Does the patient understand why "dry weight" is important: Yes    Was the patient educated on the CHF red flags/symptoms: Yes   Weight gain of 2 pounds in a day or 5 pounds in a week  Increased swelling of your hands, abdomen, legs, or ankles   Increase in shortness of breath with activity  Increase in the number of pillows needed to sleep at night  New or more frequent chest pain or tightness  New onset of dizziness or new onset of dizziness or lightheadedness after standing up    Is the patient currently experiencing any of the CHF red flags/symptoms: No       Was the patient instructed on what to do if he/she is experiencing any of the CHF red flags/symptoms: Yes     Is patient familiar with the CHF zones: Yes    Education on zones provided: Yes    Was [facility] TCM visit initiated: No, HH in place     Does patient meet EF requirements for cardiac rehab?  Yes, 20-25%    If yes, patient notified of cardiac rehab services and that a cardiac rehab team member will be calling patient with more information:  Yes    Date patient was referred to cardiac rehab: 06/21/15    Call Summary Notes-- CM contacted patient's spouse in order to enroll in TCM and initiate CHF Management. CM will follow up.     Allean Found, MSW  Case Manager  [facility]  Transitional Care Management  T [phone] F [phone]

## 2016-06-20 ENCOUNTER — Encounter: Payer: Self-pay | Admitting: Registered Nurse

## 2016-06-20 ENCOUNTER — Ambulatory Visit: Payer: Medicare Other | Attending: Acute Care | Admitting: Acute Care

## 2016-06-20 VITALS — BP 105/56 | HR 112 | Temp 97.9°F | Wt 129.0 lb

## 2016-06-20 DIAGNOSIS — I428 Other cardiomyopathies: Secondary | ICD-10-CM

## 2016-06-20 DIAGNOSIS — E854 Organ-limited amyloidosis: Secondary | ICD-10-CM

## 2016-06-20 DIAGNOSIS — I5022 Chronic systolic (congestive) heart failure: Secondary | ICD-10-CM

## 2016-06-20 DIAGNOSIS — I43 Cardiomyopathy in diseases classified elsewhere: Secondary | ICD-10-CM

## 2016-06-20 DIAGNOSIS — R600 Localized edema: Secondary | ICD-10-CM

## 2016-06-20 MED ORDER — SPIRONOLACTONE 50 MG PO TABS
50.0000 mg | ORAL_TABLET | Freq: Every day | ORAL | 3 refills | Status: AC
Start: 2016-06-20 — End: ?

## 2016-06-20 MED ORDER — MIDODRINE HCL 10 MG PO TABS
10.0000 mg | ORAL_TABLET | Freq: Three times a day (TID) | ORAL | 3 refills | Status: AC
Start: 2016-06-20 — End: ?

## 2016-06-20 NOTE — Patient Instructions (Addendum)
Changes for Today:  1.  Try ACE wraps to help with the swelling in your legs.  Continue to elevate your feet at night.   2.  Please schedule a follow up with your primary care doctor.  Recommend physical therapy to help increase your strength.  3.  Start potassium 20 mEq every other day.  We will continue to monitor your labs every week and adjust this dose as needed.  4.  You continue to have some extra fluid but we will not make any changes to your bumex or metolazone since your symptoms and weights are stable. Please call Levada Dy [phone] if you gain 2-3 pounds in a day or 5 pounds in a week or if you begin to have worsening symptoms.   5.  Please schedule a follow up with Dr. Wyatt Haste or Dr. Burna Sis (Kidney doctor) [phone]  6.  Please schedule a follow up with Dr. Joneen Caraway (hemotology/oncology) [phone]  7. Return to the clinic in one week with Dr. Lu Duffel for a heart failure appointment.    Living with Heart Failure Information:  1. Follow a sodium restricted diet. Eat no more than 2000 milligrams of sodium per day.  Read labels of packaged foods if you are not familiar with the sodium content per serving.   2. Follow a 64 ounce fluid restriction. This is approximately equal to 2 liters, or 2000 cc's of fluid. Count all of the sources of fluid in your diet: drinks, fruits, vegetables, soups, etc.  3. Weigh yourself daily.  4. Report to our nurses a change in your weight of 2 pounds or more in one day, or 4 pounds or more in one week.  Call if you experience a significant change in your heart failure symptoms.  5. Anticipate refills for your medications.   6. Maintain regular exercise. Try to exercise at a level that is comfortable for you for 30 minutes, 4 to 7 times per week.   7. Remain up to date with your vaccinations. Obtain a flu shot annually during flu season. Obtain a pneumococcal vaccination at least once prior to the age of 17, and repeat this again once you pass the age of 59 if it has been 5  years since your last vaccination.  8. Keep track of your appointments with our clinic and with your other doctors. Please call us if you think that you will miss your appointment with [place].  9. Phone numbers:   - Front Desk: [phone]   - [name], NP: [phone]   - Joan Flores, RN: [phone]   - Stana Bunting, NP: [phone]   - Anette Riedel, RN [phone]

## 2016-06-20 NOTE — Progress Notes (Signed)
I had the pleasure of seeing [name] today in the [facility] at [facility].  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 He responded to CyBorD (January - August 2016) though relapsed (increased serum free light chains from 35 in October 2017 to 378 in October 2017) and was started on revlimid 5 mg QOD on 04/09/16. He was hospitalized at [facility] 11/17-11/27/17, during which he was started on milrinone 0.375 mcg/kg/min. He has been under the care of physicians at [facility], though is planning on receiving care locally from this point on.      Gregory [name] was last seen on 06/04/16 and was directly admitted the following day for ADHF and AKI. He was diuresed via bumex infusion and started on midodrine for hypotension.  Followed by nephrology and hematology/oncology during admission.  Palliative care was consulted; Rasmus declined their services as he wishes to pursue amyloid treatment with oncology.  He was discharged on 06/13/16 on milrinone 0.375 mcg/kg/min, bumex 6 mg BID, metolazone 5 mg daily, midodrine 10 mg TID and aldactone 50 mg daily.      From a cardiovascular standpoint, Gregory [name] states he is feeling better.  His chief complaint is ongoing pain from a gout flare in his left hand which is slowly improving.  He reports his lower extremity edema is improved, enabling him to walk about the house.  He denies SOB with walking but admits he only walks short distances. He arrived today in a wheelchair due to fatigue and muscle weakness.  Gregory [name] reports he continues to struggle with putting on socks/shoes d/t chronic edema.  He is not using ACE wraps or compression socks but reports he elevates his feet on a pillow each night.  He is able to complete ADLs independently.    Denies fever, chills, dizziness, lightheadedness, pre-syncope, syncope or palpitations. He has not had chest pain or  pressure.  He reports he is sleeping better at night.  He had a hospital bed delivered after discharge and sleeps on an incline; he has avoided lying flat, denies PND.  His appetite is fair but stable; denies nausea, vomiting, abdominal bloating/distention or early satiety.      He has been checking blood pressures at home; they have been 105-111/50-70 mmHg. He has been checking morning weights; his weight has been stable at 130-131 lbs. He has no problems with his PICC line or Milrinone pump. He reports he is taking his medications as prescribed with a robust response to his diuretics.  Of note, when separated from his wife today she verbalized that he will occasionally refuse medications, specifically metolazone d/t the robust response.  She will leave the medication sitting next to him and later observes it to be gone but cannot say for sure if he takes it.    Review of Cardiovascular Testing    Echocardiogramon 11/18/17shows a left ventricular end-diastolic dimension of 27CW, concentric LVH (walls 15 mm), global HKwith a left ventricular ejection fraction of 20-25%, and grade 3diastolic dysfunction. The right ventricle is dilated with reduced systolic function. The left atrium isdilated and the right atrium isdilated. The aortic valve is trileaflet withoutevidence of stenosis; there is trivialinsufficiency. The mitral valve is morphologically normal with mildregurgitation. There is mildtricuspid regurgitation. Pulmonary systolic pressure is estimated to be 58mHg above right atrial pressure (10-135mg). There is trivialpulmonic insufficiency. A pericardial effusion is notpresent.    ECG Findingson  05/03/16: sinus rhythm, left anterior fascicular block and inferior MI, low voltage. As compared to hisprevious ECG on 11/1/17there is nono significant change.     Right heart catheterizationon 05/07/16 (prior to milrinone) showed a RA pressure of 8 mmHg, RV pressure of 24/8 mmHg, PA  pressure of 29/9/16 mmHg, and a PW pressure of 13 mmHg. PA saturation was 45.8%. Cardiac output by Fick calculation was 1.9 L/min with a corresponding index of 1.2 L/min/m2. Cardiac output by thermodilution was 1.6 L/min with a corresponding index of 0.95 L/min/m2. PVR is 1.6 WU.    Patient Active Problem List    Diagnosis Date Noted   . NICM (nonischemic cardiomyopathy) 06/04/2016   . Chronic systolic heart failure 51/76/1607   . Cardiac amyloidosis 05/06/2016   . Chronic renal failure, stage 3 (moderate) 05/04/2016   . Hyperkalemia 05/03/2016   . Light chain (AL) amyloidosis    . Bilateral lower extremity edema    . Elevated troponin 04/21/2014   . Acute on chronic combined systolic and diastolic heart failure 37/03/6268   . Asthma without status asthmaticus    . Primary localized osteoarthrosis, shoulder region 06/18/2013   . Ganglion cyst of wrist, right 12/21/2012     Past Surgical History:   Procedure Laterality Date   . ARTHROPLASTY, SHOULDER, TOTAL  06/18/2013    Procedure: ARTHROPLASTY, SHOULDER, TOTAL;  Surgeon: Wendi Snipes, MD;  Location: [facility];  Service: Orthopedics;  Laterality: Left;   . COLONOSCOPY     . EXCISION, GANGLION  12/19/2012    Procedure: EXCISION, GANGLION;  Surgeon: Jillyn Hidden, MD;  Location: [facility];  Service: Orthopedics;  Laterality: Right;  injection right and left shoulder   . HERNIA REPAIR      lt inguinal age 27   . INJECTION, MEDICATION  12/19/2012    Procedure: INJECTION, MEDICATION;  Surgeon: Jillyn Hidden, MD;  Location: [facility];  Service: Orthopedics;  Laterality: Bilateral;     Current Outpatient Prescriptions   Medication Sig Dispense Refill   . aspirin EC 81 MG EC tablet Take 81 mg by mouth daily.     . bumetanide (BUMEX) 2 MG tablet Take 3 tablets (6 mg total) by mouth 2 (two) times daily. 540 tablet 3   . metOLazone (ZAROXOLYN) 5 MG tablet Take 5 mg by mouth daily.30 minutes prior to Bumetanide (Bumex)         . midodrine (PROAMATINE)  10 MG tablet Take 1 tablet (10 mg total) by mouth 3 (three) times daily with meals. 90 tablet 3   . milrinone (PRIMACOR) 20-5 MG/100ML-% infusion Infuse 23.0625 mcg/min into the vein continuous.     [name] spironolactone (ALDACTONE) 50 MG tablet Take 1 tablet (50 mg total) by mouth daily. 90 tablet 3   . azelastine (ASTELIN) 0.1 % nasal spray 1 spray by Nasal route 2 (two) times daily.  3   . famotidine (PEPCID) 20 MG tablet Take 1 tablet (20 mg total) by mouth nightly as needed for Heartburn. 30 tablet 0   . potassium chloride (K-DUR,KLOR-CON) 20 MEQ tablet Take 20 mEq by mouth every other day.       No current facility-administered medications for this visit.      No Known Allergies    Family History:  Reviewed and updated in the patient's EMR.    Social History:  Reviewed and updated in the patient's EMR.    ROS is as described above.  A full, detailed ROS was  performed today and is otherwise negative.    Vital Signs:  BP 105/56   Pulse (!) 112   Temp 97.9 F (36.6 C)   Wt 58.5 kg (129 lb)   BMI 21.47 kg/m .  Estimated body mass index is 21.47 kg/m as calculated from the following:    Height as of 06/05/16: 1.651 m (_0 ).    Weight as of this encounter: 58.5 kg (129 lb).   Wt Readings from Last 3 Encounters:   06/20/16 58.5 kg (129 lb)   06/12/16 63.9 kg (140 lb 12.8 oz)   06/04/16 69.4 kg (153 lb)      BP Readings from Last 3 Encounters:   06/20/16 105/56   06/13/16 107/65   06/04/16 (!) 88/55   Recorded weight during today's visit (129 lbs) is patient's reported weight this morning using his home scale.      Physical Exam:  General:  appears stated age, cooperative, fatigued and no distress, slightly drowsy.  Single lumen tunneled central line on the right anterior chest wall with biopatch intact; site clean and dry without erythema.   HEENT:  conjunctiva pale, bucal mucosa moist without streaking or exudate.  JVD to upper neck when sitting upright. No adenopathy, no carotid bruit and supple, symmetrical,  trachea midline. Carotid pulsations are diminished bilaterally.  Lungs:  clear to auscultation bilaterally with normal rate and effort  Cardiovascular:  prominent apical impulse, regular rate and rhythm, S1, S2 normal and no S3 or S4Systolic murmur II/VI heard best at the apex.  Abdominal:  normal findings: bowel sounds normal and soft, non-tender and abnormal findings:  ascites and distended.   Hepatojular reflux is present.  Extremities:  edema 2-3+ pitting to mid calf.  Extremities are atraumatic and warm to touch.    Neuromuscular exam:  Grossly non-focal though not formally tested.  Normal muscle tone.     Laboratories    06/19/2016:  Glucose 119  BUN 98  Cr 3.58  Na 133  K 3.5  Mg 2.1  T. Bili 3.3    06/13/2016:  Glucose 108  BUN 101  Cr 3.3  Na 133  K 4.1  Mg 1.8    06/03/2016:  Glucose 118  BUN 77  Cr 3.91  Na 133  K 3.1  Mag 2.2    05/31/2016:  Glucose 103  BUN 69  Cr 3.0  Na 131  K 3.9    Assessment and Plan:    Cardiomyopathy: In summary, Mr. 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 He is currently NYHA functional class II-III.  Though much improved, he remains volume overloaded on exam.  I will not make any changes to his diuretics today given that Lafe has had sufficient improvement in his symptoms to the degree that he is now able to walk about the house and is sleeping better at night.  He currently is on high doses of diuretics with limited room for increase, prefer dose escalation when patient is symptomatic. I encouraged him to use ACE wraps to help with his chronic LE edema.  He will continue aldactone 50 mg daily.  No ACE-i/ARB/ARNI/BB d/t renal dysfunction and hypotension.     Given intolerance to GDMT, recent hospitalizations, renal dysfunction and need for midodrine to augment BP, I am worried about Gregory [name]'s trajectory.  Gregory [name] was seen by Dr. Joneen Caraway during his hospitalization and wishes to pursue treatment for his AL amyloid.  Encouraged Gregory [name] to schedule a follow up appointment with Dr. Joneen Caraway ASAP.  He will continue to weigh himself and monitor his BP daily.  We will continue to follow Carle closely in clinic and encouraged both he and his wife to call our office should he have any worsening symptoms.    Electrophysiology:  Shiva does not have a device.    Metabolic review:  HWK0S on 03/30/16 was 5.5%.  Lipid panel on 03/30/16: total 156, triglycerides 94, LDL 113, and HDL 24.    Renal Function:  [name] renal function on 06/19/16 was:  BUN of 98, creatinine of 3.56, and eGFR of 19.  Electrolytes were not normal.  Serum Na was 133. Serum potassium 3.5.  Gregory [name] will start KCl 20 mEq every other day.  We will trend labs weekly and adjust supplementation as needed to maintain K > 4.0.    Physical Debilitation:  Would benefit from PT.  Defer to PCP for management.  They plan to schedule a f/u appointment with Dr. Caleen Jobs in the next few days.    Gout: Improving.  Recommended he takes Tylenol PRN for pain. Will defer gout treatment and further pain management to PCP.    As always, it is a pleasure to participate in the care of [name], and if you have any questions or concerns, please feel free to contact us at [phone].    RTC in one week with Dr. Lu Duffel    Alphonzo Severance, AGACNP-BC  Advanced Heart Failure/ Heart Transplant

## 2016-06-21 ENCOUNTER — Other Ambulatory Visit: Payer: Self-pay | Admitting: Acute Care

## 2016-06-21 DIAGNOSIS — I5022 Chronic systolic (congestive) heart failure: Secondary | ICD-10-CM

## 2016-06-21 MED ORDER — POTASSIUM CHLORIDE CRYS ER 20 MEQ PO TBCR
20.0000 meq | EXTENDED_RELEASE_TABLET | ORAL | 11 refills | Status: DC
Start: 2016-06-21 — End: 2016-08-01

## 2016-06-24 ENCOUNTER — Telehealth: Payer: Self-pay | Admitting: Acute Care

## 2016-06-24 NOTE — Telephone Encounter (Signed)
Patient's wife, Gregory [name], called to let me know Gregory [name]'s weight today is 123 lbs, down 6 lbs over 4 days.  She let me know she was not present when he weighed himself.  Weight yesterday was 127 lbs and was 129 lbs on Saturday ( all unobserved by Gregory [name]). She reports he is not dizzy or lightheaded, BP stable 101-116/76-85.  He had an episode of vomiting this morning before taking his morning medications and reports he felt better immediately after the incident.  Denies fever.  He feels chronically chilled which he reports is unchanged from his baseline. His appetite is stable.  Gregory [name] reports he is taking his medications as prescribed.  I asked her to have Gregory [name] re-weigh himself in her presence and call me back to confirm.  Reminded her to make sure to use the same scale every day.      She later called me back to report his weight with his milrinone pack was 128 lbs and is 124.8 lbs without it.  She reports Gregory [name] does not remember if his weight earlier this morning was with or without the pack.  In light of this information, his weight is likely decreased though not to the extent of 6 lbs.  No changes to plan of care for now given the stability of BP/symptoms and noted volume overload on 06/20/16.  Instructed her to call the office should he continue to vomit or if he develops worsening symptoms.  Transferred Gregory [name] to Gregory [name] to make a f/u appointment with Dr. May.

## 2016-06-25 NOTE — Progress Notes (Signed)
Transitional Care Management    CHF Management-Teachback--  Does the patient understand what could make their heart failure worse (diet, liquids, activity, weight, smoking, infections): Yes  Can the patient identify the signs and symptoms that CHF is worsening: Yes     Can the patient identify what to do if he/she experiences these symptoms: Yes      Physician Follow Up/Medication Follow Up--    Patient seen by PCP or cardiologist within critical timeframe of hospital discharge: Yes     Were any medications changed at follow up medical appointments:  Yes, started potassium 20 mEq    Depression Screen--    Over the past 4 weeks, has the patient felt down, depressed or hopeless:  Yes    Over the past 4 weeks, has the patients felt little interest or pleasure in doing things:  Yes    What resource(s) provided if positive response to one or both of the depression screen questions: Per patient's spouse, "it is hard to say whether it is depression or early signs of dementia." Patient's spouse states that she has let PCP know to assess patient for depression at appointment tomorrow, 06/26/16. CM offered supportive counseling, patient's spouse states that she will offer to patient. CM will follow up.     Gaps in Care/Resource Connection--    Living situation (alone, family, home environment, etc): with family     Gap in Care (insurance status, medication coverage, community connections, transportation, etc): home health aide    Referrals placed for patient: [facility]     Call Summary Notes-- CM contacted patient's spouse in order to continue CHF Management. CM will follow up.     Allean Found, MSW  Case Manager  [facility]  Transitional Care Management  T [phone] F [phone]

## 2016-06-28 ENCOUNTER — Encounter: Payer: Self-pay | Admitting: Acute Care

## 2016-06-28 ENCOUNTER — Telehealth: Payer: Self-pay | Admitting: Acute Care

## 2016-06-28 ENCOUNTER — Encounter: Payer: Self-pay | Admitting: Registered Nurse

## 2016-06-28 NOTE — Telephone Encounter (Signed)
Lab results from 06/26/16 reviewed:    BUN 115  Cr 4.28  Na 133  K 3.2  Cl 80    Gregory [name] reports Gregory [name] tells her his weight has been 120-122 lbs; SBP 100-105. Denies dizziness, lightheadedness.  His appetite is variable.  He has been tired during the day d/t insomnia; his PCP prescribed lunesta this week. She feels his LE edema is improved.  Instructed her to have Gregory [name] decrease metolazone to 2.5 mg daily, take KCl 40 mEq today followed by 20 mEq daily stating tomorrow.  Repeat labs on Monday or Tuesday.  She verbalized understanding and had no questions.

## 2016-07-01 ENCOUNTER — Encounter: Payer: Self-pay | Admitting: Acute Care

## 2016-07-03 ENCOUNTER — Telehealth: Payer: Self-pay

## 2016-07-03 NOTE — Telephone Encounter (Signed)
Transitional Care Management    CM attempted to contact pt. At [phone] (home) [phone] (work) in regards to recent hospitalization and CHF  Management. CM left message asking that patient return call to CM at [phone].     Allean Found, MSW  Case Manager  [facility]  Transitional Care Management  T [phone] F [phone]

## 2016-07-03 NOTE — Progress Notes (Signed)
I had the pleasure of seeing Mr.WilliamSmithtoday in the [facility] at [facility]. As you know, Haffey a stage D NICM in the setting of AL amyloidosis which was diagnosed in December 2015. He responded to CyBorD (January - August 2016) though relapsed (increased serum free light chains from 35 in October 2017 to 378 in October 2017) and was started on revlimid 5 mg QOD on 04/09/16. I met the patient when he was hospitalized at [facility] 11/17-11/27/17, during which he was started on milrinone 0.375 mcg/kg/min. He has been under the care of physicians at [facility], though is planning on receiving care locally from this point on. [name] last seen on 1/4/18and returns to the clinic as previously scheduled.     [name] BMP on 06/26/16 showed worsening renal function. His weight has decreased to 120-122 lbs, while our goal weight had been 130 lbs. His metolazone was decreased from 5 mg QD to 2.5 QD.    From a cardiovascular standpoint, Gregory [name] states has done poorly. His chronic leg edema has improved. He is eating approximately half of his meals, stopping due to satiety. He denies abdominal bloating/distention. He denies dyspnea, though he is ambulating only in the house (he is using a wheelchair when out of the house. He reports that he is taking longer to perform ADLs ("I just cant' move as fast."     The patient has developed insomnia which has not responded to [place], which was prescribed by Dr Caleen Jobs. He reports that he is very fatigued because he has not been sleeping. He is vague as to why he is not, though mentions that he names in his head as he is lying in bed. He is not napping during the day. His wife notes that he is inactive during the day and wonders if this is why he is not sleeping.    He has not had a follow-up office visit with oncology, as planned during his hospitalization. His wife has tried to make an appointment, though  she believes the office told her that they were not taking new patients.    He denies any fever, chills, night sweats, lightheadedness, dizziness, presyncope or syncope, shortness of breath at rest or significant DOE, orthopnea, or PND. He has had no chest pain or pressure.  His appetite has not been stable and he denies any abdominal bloating or distention, nausea, vomiting or changes in bowel or bladder habits.  He has had no hospital admissions or emergency room visits.    Williamreports that hehas notbeen exercising other than walking with ADLs. Hedoescheck blood pressures at home; they have been 91-108/56-75 mmHg. Hehasbeen checking morning weights; hisweight hasbeen stable at 121-122 lbs. Williamhasbeen following a fluid restriction of 1.5L/day and hasrestricting dietary sodium content to no more than 2 gm/day. Hereports that hehasbeen compliant with hismedications.     Review of Cardiovascular Testing    Echocardiogramon 11/18/17shows a left ventricular end-diastolic dimension of 34CP, concentric LVH (walls 15 mm), global HKwith a left ventricular ejection fraction of 20-25%, and grade 3diastolic dysfunction. The right ventricle is dilated with reduced systolic function. The left atrium isdilated and the right atrium isdilated. The aortic valve is trileaflet withoutevidence of stenosis; there is trivialinsufficiency. The mitral valve is morphologically normal with mildregurgitation. There is mildtricuspid regurgitation. Pulmonary systolic pressure is estimated to be 10mHg above right atrial pressure (10-164mg). There is trivialpulmonic insufficiency. A pericardial effusion is notpresent.    ECG Findingson 05/03/16: sinus rhythm, left anterior  fascicular block and inferior MI, low voltage. As compared to hisprevious ECG on 11/1/17there is nono significant change.     Right heart catheterizationon 05/07/16 (prior to milrinone)showed a RA pressure of 8  mmHg, RV pressure of 24/8 mmHg, PA pressure of 29/9/16 mmHg, and a PW pressure of 13 mmHg. PA saturation was 45.8%. Cardiac output by Fick calculation was 1.9 L/min with a corresponding index of 1.2 L/min/m2. Cardiac output by thermodilution was 1.6 L/min with a corresponding index of 0.95 L/min/m2. PVR is 1.6 WU.    Patient Active Problem List    Diagnosis Date Noted   . NICM (nonischemic cardiomyopathy) 06/04/2016   . Chronic systolic heart failure 17/02/4840   . Cardiac amyloidosis 05/06/2016   . Chronic renal failure, stage 3 (moderate) 05/04/2016   . Hyperkalemia 05/03/2016   . Light chain (AL) amyloidosis    . Bilateral lower extremity edema    . Elevated troponin 04/21/2014   . Acute on chronic combined systolic and diastolic heart failure 45/65/4728   . Asthma without status asthmaticus    . Primary localized osteoarthrosis, shoulder region 06/18/2013   . Ganglion cyst of wrist, right 12/21/2012     Past Surgical History:   Procedure Laterality Date   . ARTHROPLASTY, SHOULDER, TOTAL  06/18/2013    Procedure: ARTHROPLASTY, SHOULDER, TOTAL;  Surgeon: Wendi Snipes, MD;  Location: [facility];  Service: Orthopedics;  Laterality: Left;   . COLONOSCOPY     . EXCISION, GANGLION  12/19/2012    Procedure: EXCISION, GANGLION;  Surgeon: Jillyn Hidden, MD;  Location: [facility];  Service: Orthopedics;  Laterality: Right;  injection right and left shoulder   . HERNIA REPAIR      lt inguinal age 26   . INJECTION, MEDICATION  12/19/2012    Procedure: INJECTION, MEDICATION;  Surgeon: Jillyn Hidden, MD;  Location: [facility];  Service: Orthopedics;  Laterality: Bilateral;     Current Outpatient Prescriptions   Medication Sig Dispense Refill   . aspirin EC 81 MG EC tablet Take 81 mg by mouth daily.     [name] azelastine (ASTELIN) 0.1 % nasal spray 1 spray by Nasal route 2 (two) times daily.  3   . bumetanide (BUMEX) 2 MG tablet Take 3 tablets (6 mg total) by mouth 2 (two) times daily. 540 tablet 3   .  eszopiclone (LUNESTA) 2 MG tablet Take 2 mg by mouth nightly.  0   . metOLazone (ZAROXOLYN) 5 MG tablet Take 2.5 mg by mouth daily.30 minutes prior to Bumetanide (Bumex)         . midodrine (PROAMATINE) 10 MG tablet Take 1 tablet (10 mg total) by mouth 3 (three) times daily with meals. 90 tablet 3   . milrinone (PRIMACOR) 20-5 MG/100ML-% infusion Infuse 23.0625 mcg/min into the vein continuous.     . potassium chloride (K-DUR,KLOR-CON) 20 MEQ tablet Take 1 tablet (20 mEq total) by mouth every other day. (Patient taking differently: Take 20 mEq by mouth daily.    ) 30 tablet 11   . spironolactone (ALDACTONE) 50 MG tablet Take 1 tablet (50 mg total) by mouth daily. 90 tablet 3   . famotidine (PEPCID) 20 MG tablet Take 1 tablet (20 mg total) by mouth nightly as needed for Heartburn. 30 tablet 0     No current facility-administered medications for this visit.      No Known Allergies    Family History:  Reviewed and updated in the patient's EMR.  Social History:  Reviewed and updated in the patient's EMR.    ROS is as described above.  A full, detailed ROS was performed today and is otherwise negative.    Vital Signs:  BP (!) 89/59 (BP Site: Left arm, Patient Position: Sitting, Cuff Size: Medium)   Pulse (!) 104   Temp 97.8 F (36.6 C) (Oral)   Wt 58.5 kg (129 lb)   BMI 21.47 kg/m .  Estimated body mass index is 21.47 kg/m as calculated from the following:    Height as of 06/05/16: 1.651 m (_0 ).    Weight as of this encounter: 58.5 kg (129 lb).   Wt Readings from Last 3 Encounters:   07/04/16 58.5 kg (129 lb)   06/20/16 58.5 kg (129 lb)   06/12/16 63.9 kg (140 lb 12.8 oz)      BP Readings from Last 3 Encounters:   07/04/16 (!) 89/59   06/20/16 105/56   06/13/16 107/65        Physical Exam:  General:  alert, appears stated age, cachectic, cooperative, fatigued and no distress. The patient is in a wheelchair.  HEENT:  conjunctiva pale, bucal mucosa moist without streaking or exudate.  JVD - 2 cm above sternal  notch, no adenopathy, no carotid bruit and supple, symmetrical, trachea midline.   Lungs:  clear to auscultation bilaterally with normal rate and effort  Cardiovascular:  regular rate and rhythm, S1, S2 normal, no murmur, click, rub or gallop  Abdominal:  soft, non-tender; bowel sounds normal; no masses,  no organomegaly.  Hepatojular reflux is not present.  Extremities:  edema 1+ pitting edema bilaterally of the legs.  Extremities are warm to the touch.    Neuromuscular exam:  Grossly non-focal though not formally tested.  Normal muscle tone.     Laboratories  I reviewed the patient's most recent lab results obtained in preparation for today's visit:    Assessment and Plan:    Cardiomyopathy: In summary, Mr.Smithhas a stage D NICM in the setting of AL amyloidosis and is on palliative milrinone 0.375 mcg/kg/min. He is currently NYHA functional class III-IV. [name] volume status has markedly improved since increasing his diuretic regimen, though I am concerned that his worsening renal function could be playing a role in his insomnia and fatigue. Therefore, I have discontinued the metolazone. I expect that the patient's weight will increase some with this change. I will continue following labs on a weekly basis. I requested that the patient contact us in one week regarding his weight.    I spoke with Dr Crecencio Mc who was covering for Dr Joneen Caraway. She will have her office contact the patient regarding an appointment and will potentially see the patient next week. I do not think the patient and his wife understand the advanced nature of his light chain disease, and the potential limited options for treatment. My intent of involving Dr Joneen Caraway during the patient's hospitalization was to help with conversations regarding prognosis, particularly if treatment options are limited. I discussed this with Dr Crecencio Mc today.    Electrophysiology: Williamdoes not have a device.    Metabolic [id number] on  03/30/16 was 5.5%. Lipid panel on 03/30/16: total 156, triglycerides 94, LDL 113, and HDL 24.    Renal Function:  [name] renal function on 06/28/16 was:  BUN of 115, creatinine of 4.28, and eGFR of 15.  Electrolytes were not normal.  Serum Na 132 and K 3.2.    Follow-up:  We have  asked Mr. Spang to return to the [facility] in 2-3 weeks.  As always, it is a pleasure to participate in the care of Mr. Banton, and if you have any questions or concerns, please feel free to contact us at [phone].

## 2016-07-04 ENCOUNTER — Encounter: Payer: Self-pay | Admitting: Acute Care

## 2016-07-04 ENCOUNTER — Telehealth: Payer: Self-pay

## 2016-07-04 ENCOUNTER — Ambulatory Visit (HOSPITAL_BASED_OUTPATIENT_CLINIC_OR_DEPARTMENT_OTHER): Payer: Medicare Other | Admitting: Advanced Heart Failure and Transplant Cardiology

## 2016-07-04 VITALS — BP 89/59 | HR 104 | Temp 97.8°F | Wt 129.0 lb

## 2016-07-04 DIAGNOSIS — I43 Cardiomyopathy in diseases classified elsewhere: Secondary | ICD-10-CM

## 2016-07-04 DIAGNOSIS — I5032 Chronic diastolic (congestive) heart failure: Secondary | ICD-10-CM

## 2016-07-04 DIAGNOSIS — E854 Organ-limited amyloidosis: Secondary | ICD-10-CM

## 2016-07-04 DIAGNOSIS — I428 Other cardiomyopathies: Secondary | ICD-10-CM

## 2016-07-04 NOTE — Patient Instructions (Signed)
Changes for Today:  1. The milrinone increases cardiac output to the body and improve symptoms. If he is tired for other reasons, like not sleeping at night, the milrinone will not help that.  2. Palliative care may be able to help with these symptoms. We will reach out to [facility] regarding a consult.  3. Dr. May will reach out to Dr. Joneen Caraway regarding getting an appointment.  4. Stop metolazone. Call us if your weight increases more than 2-3 lbs in a day or 4-5 lbs in a week.  5. Call in 1 week with weights. You may get a little weight back, which is okay.    Return to the clinic in 2-3 weeks with Dr. Lu Duffel for a heart failure appointment.    Living with Heart Failure Information:  1. Follow a sodium restricted diet. Eat no more than 2000 milligrams of sodium per day.  Read labels of packaged foods if you are not familiar with the sodium content per serving.   2. Follow a 48 ounce fluid restriction. This is approximately equal to 1.5 liters, or 1500 cc's of fluid. Count all of the sources of fluid in your diet: drinks, fruits, vegetables, soups, etc.  3. Weigh yourself daily.  4. Report to our nurses a change in your weight of 2 pounds or more in one day, or 4 pounds or more in one week.  Call if you experience a significant change in your heart failure symptoms.  5. Anticipate refills for your medications.   6. Maintain regular exercise. Try to exercise at a level that is comfortable for you for 30 minutes, 4 to 7 times per week.   7. Remain up to date with your vaccinations. Obtain a flu shot annually during flu season. Obtain a pneumococcal vaccination at least once prior to the age of 32, and repeat this again once you pass the age of 75 if it has been 5 years since your last vaccination.  8. Keep track of your appointments with our clinic and with your other doctors. Please call us if you think that you will miss your appointment with [place].  9. Phone numbers:   - Front Desk: [phone]   - [name],  NP: [phone]   - Joan Flores, RN: [phone]   - Stana Bunting, NP: [phone]   - Anette Riedel, RN [phone]

## 2016-07-04 NOTE — Telephone Encounter (Signed)
Called Mr. Aigner to let him know the office of Dr. Carmelina Dane, oncology, will be contacting him to set up and appointment most likely next week. Also, information for palliative care services will be faxed to [facility] tomorrow and once the paperwork is processed, they will reach out to set up a consult.

## 2016-07-10 ENCOUNTER — Telehealth: Payer: Self-pay

## 2016-07-10 ENCOUNTER — Encounter: Payer: Self-pay | Admitting: Acute Care

## 2016-07-10 NOTE — Telephone Encounter (Signed)
I left a voicemail for the patient and a voicemail for the patient's wife to call back to review the patient's symptoms and labs from 07/09/2016.

## 2016-07-10 NOTE — Telephone Encounter (Addendum)
I called the patient's home number and wife's cell number to get in touch with the patient. I left voicemails on both lines, as well as, updated Tami Lin the home health RN that Dr. Lu Duffel would like them to come to the ED. She stated understanding and will direct the wife to call me or go to the ED.     I spoke with the patient's wife today, she stated her husband saw Dr. Joneen Caraway today was going to start a medication for amyloidosis soon, he has been tired and continue to not sleep well at night. She stated that he is not taking his medications because he does not allow her to observe him taking the medication and continues with a low appetite and vommiting. He denied tremors and stated his weight on his home scale was 127 lbs today and BP by home health RN 90's/60's. Wife and patient agreed to come to [facility] ED by car, Claiborne Billings, Medical illustrator in the ED and Dr. Reynold Bowen was updated about his arrival.

## 2016-07-10 NOTE — Telephone Encounter (Signed)
Transitional Care Management    CM attempted to contact pt. At [phone] (home) [phone] (work) in regards to recent hospitalization and CHF Management. CM left message asking that patient return call to CM at [phone].     Allean Found, MSW  Case Manager  [facility]  Transitional Care Management  T [phone] F [phone]

## 2016-07-11 ENCOUNTER — Emergency Department: Payer: Medicare Other

## 2016-07-11 ENCOUNTER — Inpatient Hospital Stay: Payer: Medicare Other | Admitting: Internal Medicine

## 2016-07-11 ENCOUNTER — Inpatient Hospital Stay
Admission: EM | Admit: 2016-07-11 | Discharge: 2016-07-20 | DRG: 545 | Disposition: A | Discharge status: Hospice | Payer: Medicare Other | Attending: Internal Medicine | Admitting: Internal Medicine

## 2016-07-11 DIAGNOSIS — Z0000 Encounter for general adult medical examination without abnormal findings: Secondary | ICD-10-CM | POA: Diagnosis not present

## 2016-07-11 DIAGNOSIS — I5022 Chronic systolic (congestive) heart failure: Secondary | ICD-10-CM | POA: Diagnosis present

## 2016-07-11 DIAGNOSIS — N183 Chronic kidney disease, stage 3 unspecified: Secondary | ICD-10-CM | POA: Diagnosis present

## 2016-07-11 DIAGNOSIS — R34 Anuria and oliguria: Secondary | ICD-10-CM

## 2016-07-11 DIAGNOSIS — E871 Hypo-osmolality and hyponatremia: Secondary | ICD-10-CM | POA: Diagnosis present

## 2016-07-11 DIAGNOSIS — I43 Cardiomyopathy in diseases classified elsewhere: Secondary | ICD-10-CM | POA: Diagnosis present

## 2016-07-11 DIAGNOSIS — E8581 Light chain (AL) amyloidosis: Principal | ICD-10-CM | POA: Diagnosis present

## 2016-07-11 DIAGNOSIS — N179 Acute kidney failure, unspecified: Secondary | ICD-10-CM | POA: Diagnosis present

## 2016-07-11 DIAGNOSIS — I5043 Acute on chronic combined systolic (congestive) and diastolic (congestive) heart failure: Secondary | ICD-10-CM | POA: Diagnosis present

## 2016-07-11 DIAGNOSIS — I130 Hypertensive heart and chronic kidney disease with heart failure and stage 1 through stage 4 chronic kidney disease, or unspecified chronic kidney disease: Secondary | ICD-10-CM | POA: Diagnosis present

## 2016-07-11 DIAGNOSIS — N19 Unspecified kidney failure: Secondary | ICD-10-CM

## 2016-07-11 DIAGNOSIS — C9000 Multiple myeloma not having achieved remission: Secondary | ICD-10-CM | POA: Diagnosis present

## 2016-07-11 DIAGNOSIS — Z66 Do not resuscitate: Secondary | ICD-10-CM | POA: Diagnosis not present

## 2016-07-11 DIAGNOSIS — I959 Hypotension, unspecified: Secondary | ICD-10-CM | POA: Diagnosis present

## 2016-07-11 DIAGNOSIS — Z515 Encounter for palliative care: Secondary | ICD-10-CM

## 2016-07-11 DIAGNOSIS — N189 Chronic kidney disease, unspecified: Secondary | ICD-10-CM

## 2016-07-11 DIAGNOSIS — D631 Anemia in chronic kidney disease: Secondary | ICD-10-CM | POA: Diagnosis present

## 2016-07-11 DIAGNOSIS — W19XXXA Unspecified fall, initial encounter: Secondary | ICD-10-CM | POA: Diagnosis present

## 2016-07-11 DIAGNOSIS — Z125 Encounter for screening for malignant neoplasm of prostate: Secondary | ICD-10-CM | POA: Diagnosis not present

## 2016-07-11 DIAGNOSIS — E7800 Pure hypercholesterolemia, unspecified: Secondary | ICD-10-CM | POA: Diagnosis not present

## 2016-07-11 DIAGNOSIS — I10 Essential (primary) hypertension: Secondary | ICD-10-CM | POA: Diagnosis not present

## 2016-07-11 LAB — CBC AND DIFFERENTIAL
Absolute NRBC: 0 10*3/uL
Basophils Absolute Automated: 0.03 10*3/uL (ref 0.00–0.20)
Basophils Automated: 0.7 %
Eosinophils Absolute Automated: 0 10*3/uL (ref 0.00–0.70)
Eosinophils Automated: 0 %
Hematocrit: 36.7 % — ABNORMAL LOW (ref 42.0–52.0)
Hgb: 12.4 g/dL — ABNORMAL LOW (ref 13.0–17.0)
Immature Granulocytes Absolute: 0 10*3/uL
Immature Granulocytes: 0 %
Lymphocytes Absolute Automated: 1.2 10*3/uL (ref 0.50–4.40)
Lymphocytes Automated: 29 %
MCH: 32.5 pg — ABNORMAL HIGH (ref 28.0–32.0)
MCHC: 33.8 g/dL (ref 32.0–36.0)
MCV: 96.3 fL (ref 80.0–100.0)
MPV: 10.9 fL (ref 9.4–12.3)
Monocytes Absolute Automated: 0.57 10*3/uL (ref 0.00–1.20)
Monocytes: 13.8 %
Neutrophils Absolute: 2.34 10*3/uL (ref 1.80–8.10)
Neutrophils: 56.5 %
Nucleated RBC: 0 /100 WBC (ref 0.0–1.0)
Platelets: 129 10*3/uL — ABNORMAL LOW (ref 140–400)
RBC: 3.81 10*6/uL — ABNORMAL LOW (ref 4.70–6.00)
RDW: 13 % (ref 12–15)
WBC: 4.14 10*3/uL (ref 3.50–10.80)

## 2016-07-11 LAB — BASIC METABOLIC PANEL
BUN: 133 mg/dL — ABNORMAL HIGH (ref 9.0–28.0)
CO2: 22 mEq/L (ref 22–29)
Calcium: 10.4 mg/dL (ref 8.5–10.5)
Chloride: 89 mEq/L — ABNORMAL LOW (ref 100–111)
Creatinine: 4.3 mg/dL — ABNORMAL HIGH (ref 0.7–1.3)
Glucose: 101 mg/dL — ABNORMAL HIGH (ref 70–100)
Potassium: 4 mEq/L (ref 3.5–5.1)
Sodium: 128 mEq/L — ABNORMAL LOW (ref 136–145)

## 2016-07-11 LAB — GFR: EGFR: 16.7

## 2016-07-11 LAB — MAGNESIUM: Magnesium: 1.7 mg/dL (ref 1.6–2.6)

## 2016-07-11 LAB — PHOSPHORUS: Phosphorus: 3.9 mg/dL (ref 2.3–4.7)

## 2016-07-11 MED ORDER — SODIUM CHLORIDE 0.9 % IV BOLUS
250.0000 mL | Freq: Once | INTRAVENOUS | Status: AC
Start: 2016-07-11 — End: 2016-07-11
  Administered 2016-07-11: 250 mL via INTRAVENOUS

## 2016-07-11 MED ORDER — ONDANSETRON HCL 4 MG/2ML IJ SOLN
4.0000 mg | Freq: Once | INTRAMUSCULAR | Status: AC
Start: 2016-07-11 — End: 2016-07-11
  Administered 2016-07-11: 4 mg via INTRAVENOUS
  Filled 2016-07-11: qty 2

## 2016-07-11 NOTE — H&P (Signed)
ADMISSION HISTORY AND PHYSICAL EXAM    Date Time: 07/11/16 10:51 PM  Patient Name: Gregory [name]  Attending Physician: Winona Legato, MD  Primary Care Physician: Zoe Lan, MD    CC: abnormal lab    Assessment:   Active Problems:    Uremia    [name] is a 71 year old male with a PMHx significant for NICM (EF 20-25%) secondary to AL amyloidosis (on milrinone), CKD, HTN and asthma who presents with AKI on CKD noted on outpatient labs as well as two episodes of vomiting today.     Plan:     #AKI on CKD - Concern for cardio-renal syndrome given advanced heart failure, however can'[name] rule out progression of amyloidosis. Followed by Dr. Wyatt Haste. Labile Cr level ranging from 3.2-5.1 over past two months.   - Estimated Creatinine Clearance: 13.1 mL/min (based on SCr of 4.3 mg/dL (H)).  - Hold spirolactone for now (may be restarted if advanced heart feels that it is more beneficial to have on board), but will continue Bumex given complex cardiac condition.   - Epic consult placed to Dr. Wyatt Haste     #Stage D NICM EF 20-25%, secondary to AL amyloid - NYHA functional class III-IV. On milrinone. Followed by advanced heart failure, Dr. May, whom he saw last week (metolazone discontinued). No signs of worsened volume overload on exam. Was admitted from 12/20-12/28 for volume overload and treated with Bumex gtt.   - Consult advanced heart failure in morning, appreciate their recommendations  - Daily standing weight, strict I/Os  - Fluid restrict to 1.5 L/day and Na 2 g/day  - Continue home Bumex 6 mg BID, continue to hold metoloazone  - Continue home milrinone dose    - Was previously followed by Palliative Care    #Vomiting - Improved. Patient states its not abnormal for him to have intermittent vomiting, but he has never vomited twice in the same day (like today). No obvious infectious component at this time. No complaints of nausea.   - Monitor for now, may need antiemetic if develops  symptoms     #Acute on chronic hyponatremia - Chronically low, often at 130 or below  - Monitor for now    #AL amyloidosis - Followed by locally by Dr. Joneen Caraway ([facility]). Previously with care at [facility].  Received 4-week course of Revlimid with dexamethasone in October, complicated by fluid retention.   - Was to follow-up as an outpatient for further treatment options   - Consider consultation should need to address as inpatient    #Hypotension  - Continue home midodrine TID    #Global:  F - None  E - K>4, Mg>2  N - Cardiac    DVT PPx: SQH  Code: Full Code    Case discussed with Dr. Ashby Dawes     History of Presenting Illness:   Gregory [name] is a 71 y.o. male who presents to the hospital with worsening renal function on outpatient labs. 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 He was recently told to stop metolazone by Dr. May. He states that he had been feeling in his normal state of health until this morning when he noted two episodes of NBNB vomiting, neither of which were associated with nausea. He notes that he has had similar episodes in the past, but never more than once in a day. He otherwise states that he feels fine.  Notes that he has been moving around well at home, relies on extra handrail that was placed for the stairwell, but has had stable functional status. States that his lower extremity edema is chronic and actually looks better than usual.     Per his wife, there are some days where he doesn'[name] take all of his medications - which he denies.     Review of systems is negative for fevers, chills, headache, lightheadedness, dizziness, changes in vision, chest pain, palpitations, dyspnea, wheezing, abdominal pain, nausea, diarrhea, constipation, dysuria, hematuria.     Past Medical History:     Past Medical History:   Diagnosis Date   . Arthritis     bilat shoulders, lt knee   . Asthma without status  asthmaticus     childhood   . Cardiac amyloidosis    . CHF (congestive heart failure)    . Fracture of unspecified bones     1974 lt shoulder   . Gout    . Malignant neoplasm        Available old records reviewed, including:  Epic    Past Surgical History:     Past Surgical History:   Procedure Laterality Date   . ARTHROPLASTY, SHOULDER, TOTAL  06/18/2013    Procedure: ARTHROPLASTY, SHOULDER, TOTAL;  Surgeon: Wendi Snipes, MD;  Location: [facility];  Service: Orthopedics;  Laterality: Left;   . COLONOSCOPY     . EXCISION, GANGLION  12/19/2012    Procedure: EXCISION, GANGLION;  Surgeon: Jillyn Hidden, MD;  Location: [facility];  Service: Orthopedics;  Laterality: Right;  injection right and left shoulder   . HERNIA REPAIR      lt inguinal age 41   . INJECTION, MEDICATION  12/19/2012    Procedure: INJECTION, MEDICATION;  Surgeon: Jillyn Hidden, MD;  Location: [facility];  Service: Orthopedics;  Laterality: Bilateral;       Family History:     Family History   Problem Relation Age of Onset   . Family history unknown: Yes       Social History:     History   Smoking Status   . Never Smoker   Smokeless Tobacco   . Never Used     History   Alcohol Use   . Yes     Comment: rare     History   Drug Use No       Allergies:   No Known Allergies    Medications:     Home Medications     Med List Status:  In Progress Set By: Karle Barr, RN at 07/11/2016  8:54 PM                aspirin EC 81 MG EC tablet     Take 81 mg by mouth daily.     azelastine (ASTELIN) 0.1 % nasal spray     1 spray by Nasal route 2 (two) times daily.     bumetanide (BUMEX) 2 MG tablet     Take 3 tablets (6 mg total) by mouth 2 (two) times daily.     eszopiclone (LUNESTA) 2 MG tablet     Take 2 mg by mouth nightly.     midodrine (PROAMATINE) 10 MG tablet     Take 1 tablet (10 mg total) by mouth 3 (three) times daily with meals.     milrinone (PRIMACOR) 20-5 MG/100ML-% infusion     Infuse 23.0625 mcg/min into the vein  continuous.      potassium chloride (K-DUR,KLOR-CON) 20 MEQ tablet     Take 1 tablet (20 mEq total) by mouth every other day.     Patient taking differently:  Take 20 mEq by mouth daily.         spironolactone (ALDACTONE) 50 MG tablet     Take 1 tablet (50 mg total) by mouth daily.            Method by which medications were confirmed on admission: Paitent/Epic    Review of Systems:   All other systems were reviewed and are negative except: as discussed in HPI    Physical Exam:     Patient Vitals for the past 24 hrs:   BP Temp Temp src Pulse Resp SpO2 Height Weight   07/11/16 2315 98/67 - - (!) 104 14 94 % - -   07/11/16 2300 99/69 - - (!) 107 21 96 % - -   07/11/16 2230 96/61 - - (!) 109 19 - - -   07/11/16 2100 94/63 - - (!) 104 13 - - -   07/11/16 2051 103/72 - - (!) 105 16 - - -   07/11/16 1929 102/70 98 F (36.7 C) Temporal Art (!) 109 16 97 % 1.676 m (_0 ) 56.2 kg (124 lb)     Body mass index is 20.01 kg/m.  No intake or output data in the 24 hours ending 07/11/16 2251    General: Awake, alert and oriented. In no apparent distress.   HEENT: NC/AT, EOMI, sclera anicteric, moist mucus membranes  Neck: Supple, JVP to mid neck  Cardiovascular: Tachycardia with regular rhythm, no murmur, gallops or rubs  Pulmonary: Clear to auscultation bilaterally, no wheezes, rhonchi or rales  Abdominal: Soft, non-tender, non-distended, with normoactive bowel sounds. No palpable masses or organomegaly  Extremities: 1+ pitting edema in RLE, 2+ pitting edema in LLE. Peripheral pulses 2+/4, bilaterally  Neurological: No focal neurologic deficits.   Skin: Warm, dry, with no significant rash or lesion      Labs:     Results     Procedure Component Value Units Date/Time    UA, Reflex to Microscopic (pts  3 + yrs) [[id number]]  (Abnormal) Collected:  07/11/16 2344    Specimen:  Urine Updated:  07/12/16 0006     Urine Type Clean Catch     Color, UA Yellow     Clarity, UA Clear     Specific Gravity UA 1.011     Urine pH 6.0     Leukocyte Esterase, UA  Negative     Nitrite, UA Negative     Protein, UR 30 (A)     Glucose, UA Negative     Ketones UA Negative     Urobilinogen, UA Normal mg/dL      Bilirubin, UA Negative     Blood, UA Negative     RBC, UA 0 - 2 /hpf      WBC, UA 0 - 5 /hpf      Hyaline Casts, UA 0 - 2 /lpf     GFR [[id number]] Collected:  07/11/16 1958     Updated:  07/11/16 2038     EGFR 05.0    Basic Metabolic Panel [[id number]]  (Abnormal) Collected:  07/11/16 1958    Specimen:  Blood Updated:  07/11/16 2038     Glucose 101 (H) mg/dL      BUN 133.0 (H) mg/dL      Creatinine 4.3 (H) mg/dL  Calcium 10.4 mg/dL      Sodium 128 (L) mEq/L      Potassium 4.0 mEq/L      Chloride 89 (L) mEq/L      CO2 22 mEq/L     Magnesium [[id number]] Collected:  07/11/16 1958    Specimen:  Blood Updated:  07/11/16 2038     Magnesium 1.7 mg/dL     Phosphorus [[id number]] Collected:  07/11/16 1958    Specimen:  Blood Updated:  07/11/16 2038     Phosphorus 3.9 mg/dL     CBC with differential [[id number]]  (Abnormal) Collected:  07/11/16 1958    Specimen:  Blood from Blood Updated:  07/11/16 2027     WBC 4.14 x10 3/uL      Hgb 12.4 (L) g/dL      Hematocrit 36.7 (L) %      Platelets 129 (L) x10 3/uL      RBC 3.81 (L) x10 6/uL      MCV 96.3 fL      MCH 32.5 (H) pg      MCHC 33.8 g/dL      RDW 13 %      MPV 10.9 fL      Neutrophils 56.5 %      Lymphocytes Automated 29.0 %      Monocytes 13.8 %      Eosinophils Automated 0.0 %      Basophils Automated 0.7 %      Immature Granulocyte 0.0 %      Nucleated RBC 0.0 /100 WBC      Neutrophils Absolute 2.34 x10 3/uL      Abs Lymph Automated 1.20 x10 3/uL      Abs Mono Automated 0.57 x10 3/uL      Abs Eos Automated 0.00 x10 3/uL      Absolute Baso Automated 0.03 x10 3/uL      Absolute Immature Granulocyte 0.00 x10 3/uL      Absolute NRBC 0.00 x10 3/uL     B-type Natriuretic Peptide [[id number]] Collected:  07/11/16 1958    Specimen:  Blood Updated:  07/11/16 1958          Imaging personally reviewed, including:   Xr Chest  Ap  Portable    Result Date: 07/11/2016    Negative for acute process [name], MD 07/11/2016 8:37 PM      Safety Checklist  DVT prophylaxis:  CHEST guideline (See page [id number]) Chemical and Mechanical   Foley:  Russell Rn Foley protocol Not present   IVs:  Peripheral IV   PT/OT: Ordered   Daily CBC & or Chem ordered:  SHM/ABIM guidelines (see #5) Yes, due to clinical and lab instability   Reference for approximate charges of common labs: CBC auto diff - $76  BMP - $99  Mg - $79    Signed by: Cena Benton, DO  cc:[name], [name], MD

## 2016-07-11 NOTE — ED Provider Notes (Addendum)
Dr. Gust Rung, acting as triage physician, entered orders based on brief H&P to expedite patient care.    H/o advanced heart failure on milrinone and h/o CKD sent in for worsening renal function C3 of 3.8, Sodium of 131, and BUN of 122. Pt denies SOB, CP, weakness.     Clinton Sawyer, MD  07/11/16 Jeananne Rama       Clinton Sawyer, MD  07/11/16 [phone]

## 2016-07-11 NOTE — ED Notes (Signed)
Pt was assisted to sitting on the edge of the bed for comfort. Pt was updated on poc. Call bell is within reach and pt is waiting for admission bed.

## 2016-07-11 NOTE — ED Provider Notes (Signed)
EMERGENCY DEPT VISIT NOTE      Patient information was obtained from patient    History/Exam limitations: none.      History of Present Illness    Chief Complaint  Abnormal Lab      Gregory [name] is a 71 y.o. male with PMHx significant for abnormal labs from his cardiologist. Pt was referred to Ed when he measure creatine levels of 3.8 and BUN levels of 122. Caretaker noticed decreased alertness and intermittent confusion.  Several episodes vomiting today, nonbloody/nonbilious.  No chest pain or abdominal pain. Has been off his metolazone for the past weekend. Has had decreased urine output x1week gradually, with a darker noticed color. Caretaker notes decreased liquid intake. Normal bowel movements  Follows with [place] for his CHF, Dr. May transplant         Past Medical History:   Diagnosis Date   . Arthritis     bilat shoulders, lt knee   . Asthma without status asthmaticus     childhood   . Cardiac amyloidosis    . CHF (congestive heart failure)    . Fracture of unspecified bones     1974 lt shoulder   . Gout    . Malignant neoplasm      Family History   Problem Relation Age of Onset   . Family history unknown: Yes     No current facility-administered medications for this encounter.      Current Outpatient Prescriptions   Medication Sig Dispense Refill   . aspirin EC 81 MG EC tablet Take 81 mg by mouth daily.     [name] azelastine (ASTELIN) 0.1 % nasal spray 1 spray by Nasal route 2 (two) times daily.  3   . bumetanide (BUMEX) 2 MG tablet Take 3 tablets (6 mg total) by mouth 2 (two) times daily. 540 tablet 3   . eszopiclone (LUNESTA) 2 MG tablet Take 2 mg by mouth nightly.  0   . midodrine (PROAMATINE) 10 MG tablet Take 1 tablet (10 mg total) by mouth 3 (three) times daily with meals. 90 tablet 3   . milrinone (PRIMACOR) 20-5 MG/100ML-% infusion Infuse 23.0625 mcg/min into the vein continuous.     . potassium chloride (K-DUR,KLOR-CON) 20 MEQ tablet Take 1 tablet (20 mEq total) by mouth every other day.  (Patient taking differently: Take 20 mEq by mouth daily.    ) 30 tablet 11   . spironolactone (ALDACTONE) 50 MG tablet Take 1 tablet (50 mg total) by mouth daily. 90 tablet 3     No Known Allergies  Social History     Social History   . Marital status: Married     Spouse name: N/A   . Number of children: N/A   . Years of education: N/A     Occupational History   . Not on file.     Social History Main Topics   . Smoking status: Never Smoker   . Smokeless tobacco: Never Used   . Alcohol use Yes      Comment: rare   . Drug use: No   . Sexual activity: No     Other Topics Concern   . Not on file     Social History Narrative   . No narrative on file       Review of Systems    Positive and negative systems as per HPI.  All other systems reviewed and are negative.      Physical Exam  BP  94/63   Pulse (!) 104   Temp 98 F (36.7 C) (Temporal Artery)   Resp 13   Ht _0  (1.676 m)   Wt 56.2 kg   SpO2 97%   BMI 20.01 kg/m     Nursing note and vitals reviewed  Constitutional: Appears Elderly and chronically ill-appearing. No acute distress. Sitting up  comfortably, interactive, speaks clearly & completely  Head: Normocephalic and atraumatic.  Eyes: Conjunctiva and EOM are normal. PERRL, 45m bilat  ENT: Nose clear. No redness. MM moist, no tonsillar exudate or asymmetry  Cardiovascular: 100s, regular rhythm. 2+ rad and DP pulses bilat  Right chest cath in place without erythema or drainage or tenderness  Pulmonary: Effort normal, diminished left basilar. No crackles/rales/wheezing.  Abdominal: Soft, No distention. There is no focal tenderness. There is no  rebound and no guarding.  Musculoskeletal: Normal range of motion bilat knees. No deformity, no focal tenderness.  2+ bilateral pitting pretibial edema  Neurological: Alert and oriented to person, place, and time. Grossly normal sensation  and strength.  Skin: Skin is warm and dry. No rash noted.    Psychiatric: Normal speech and behavior.      ED  treatment:    Medications   ondansetron (ZOFRAN) injection 4 mg (4 mg Intravenous Given 07/11/16 1955)   sodium chloride 0.9 % bolus 250 mL (250 mLs Intravenous New Bag 07/11/16 2051)     Medical records reviewed    Labs reviewed    Labs Reviewed   CBC AND DIFFERENTIAL - Abnormal; Notable for the following:        Result Value    Hgb 12.4 (*)     Hematocrit 36.7 (*)     Platelets 129 (*)     RBC 3.81 (*)     MCH 32.5 (*)     All other components within normal limits   BASIC METABOLIC PANEL - Abnormal; Notable for the following:     Glucose 101 (*)     BUN 133.0 (*)     Creatinine 4.3 (*)     Sodium 128 (*)     Chloride 89 (*)     All other components within normal limits   MAGNESIUM   PHOSPHORUS   GFR   URINALYSIS, REFLEX TO MICROSCOPIC EXAM IF INDICATED   B-TYPE NATRIURETIC PEPTIDE       O2 saturation: 97 %; Oxygen use: room air; Interpretation: Normal           Imaging reviewed    XR Chest  AP Portable   Final Result     Negative for acute process      JRosiland Oz MD    07/11/2016 8:37 PM            ED Course & MDM:     Chronically ill, elderly gentleman with acute on chronic renal failure and associated decrease in urine output over the last week.  I suspect a prerenal component given his decreased oral intake  Nausea and intermittent confusion, likely related to worsening uremia.  Recommended oral fluids for now  Potassium normal.  Transplant/heart contacted.  Admit medicine for ongoing management      Diagnosis:   1. Uremia    2. Oliguria    3. Acute renal failure superimposed on chronic kidney disease, unspecified CKD stage, unspecified acute renal failure type      Disposition:   ED Disposition     ED Disposition Condition Date/Time Comment    Admit  Thu Jul 11, 2016 10:26 PM Admitting Physician: Winona Legato [[id number]]   Diagnosis: Uremia [223110]   Estimated Length of Stay: > or = to 2 midnights   Tentative Discharge Plan?: Home or Self Care [1]   Patient Class: Inpatient [101]          Condition:  Stable      Arvin Collard, MD  10:30 PM        *This note was generated by the Epic EMR system/ Dragon speech recognition and may contain inherent errors or omissions not intended by the user. Grammatical errors, random word insertions, deletions, pronoun errors and incomplete sentences are occasional consequences of this technology due to software limitations. Not all errors are caught or corrected. If there are questions or concerns about the content of this note or information contained within the body of this dictation they should be addressed directly with the author for clarification.Durene Fruits, Christel Mormon, MD  07/11/16 2232

## 2016-07-11 NOTE — ED Triage Notes (Signed)
Pt has been being followed for kidney function and was told that BUN and Creat levels are elevated today - patient received the call today around 4pm and was told come ED for evaluation - last lab draw was Monday at home - as well as at oncologist appointment on Monday -   Patient states that he feels at his baseline and would not be here if he had not been told to come to the ED -   He was seen at oncologist today as well and no changes were made at that time - this was the first appointment with this MD - Dr. Dierdre Forth   Pt did have episode of vomiting x 2 today however at this time he is observed to be eating a sandwich and tolerating ice chips   His home milrinone continues via picc line on patient home pump

## 2016-07-12 ENCOUNTER — Encounter: Payer: Self-pay | Admitting: Advanced Heart Failure and Transplant Cardiology

## 2016-07-12 DIAGNOSIS — E8581 Light chain (AL) amyloidosis: Principal | ICD-10-CM

## 2016-07-12 DIAGNOSIS — R112 Nausea with vomiting, unspecified: Secondary | ICD-10-CM

## 2016-07-12 DIAGNOSIS — Z515 Encounter for palliative care: Secondary | ICD-10-CM

## 2016-07-12 DIAGNOSIS — N179 Acute kidney failure, unspecified: Secondary | ICD-10-CM

## 2016-07-12 DIAGNOSIS — I5023 Acute on chronic systolic (congestive) heart failure: Secondary | ICD-10-CM

## 2016-07-12 DIAGNOSIS — N189 Chronic kidney disease, unspecified: Secondary | ICD-10-CM

## 2016-07-12 LAB — URINALYSIS, REFLEX TO MICROSCOPIC EXAM IF INDICATED
Bilirubin, UA: NEGATIVE
Blood, UA: NEGATIVE
Glucose, UA: NEGATIVE
Ketones UA: NEGATIVE
Leukocyte Esterase, UA: NEGATIVE
Nitrite, UA: NEGATIVE
Protein, UR: 30 — AB
Specific Gravity UA: 1.011 (ref 1.001–1.035)
Urine pH: 6 (ref 5.0–8.0)
Urobilinogen, UA: NORMAL mg/dL

## 2016-07-12 LAB — CBC
Absolute NRBC: 0 10*3/uL
Hematocrit: 37.5 % — ABNORMAL LOW (ref 42.0–52.0)
Hgb: 12.4 g/dL — ABNORMAL LOW (ref 13.0–17.0)
MCH: 32.5 pg — ABNORMAL HIGH (ref 28.0–32.0)
MCHC: 33.1 g/dL (ref 32.0–36.0)
MCV: 98.4 fL (ref 80.0–100.0)
MPV: 11 fL (ref 9.4–12.3)
Nucleated RBC: 0 /100 WBC (ref 0.0–1.0)
Platelets: 126 10*3/uL — ABNORMAL LOW (ref 140–400)
RBC: 3.81 10*6/uL — ABNORMAL LOW (ref 4.70–6.00)
RDW: 13 % (ref 12–15)
WBC: 3.52 10*3/uL (ref 3.50–10.80)

## 2016-07-12 LAB — ECG 12-LEAD
Atrial Rate: 107 {beats}/min
P Axis: 61 degrees
P-R Interval: 150 ms
Q-T Interval: 300 ms
QRS Duration: 80 ms
QTC Calculation (Bezet): 400 ms
R Axis: -87 degrees
T Axis: 83 degrees
Ventricular Rate: 107 {beats}/min

## 2016-07-12 LAB — GFR: EGFR: 16.7

## 2016-07-12 LAB — HEPATIC FUNCTION PANEL
ALT: 18 U/L (ref 0–55)
AST (SGOT): 42 U/L — ABNORMAL HIGH (ref 5–34)
Albumin/Globulin Ratio: 1.2 (ref 0.9–2.2)
Albumin: 3.8 g/dL (ref 3.5–5.0)
Alkaline Phosphatase: 102 U/L (ref 38–106)
Bilirubin Direct: 1.7 mg/dL — ABNORMAL HIGH (ref 0.0–0.5)
Bilirubin Indirect: 2.4 mg/dL — ABNORMAL HIGH (ref 0.0–1.1)
Bilirubin, Total: 4.1 mg/dL — ABNORMAL HIGH (ref 0.2–1.2)
Globulin: 3.1 g/dL (ref 2.0–3.6)
Protein, Total: 6.9 g/dL (ref 6.0–8.3)

## 2016-07-12 LAB — BASIC METABOLIC PANEL
BUN: 123 mg/dL — ABNORMAL HIGH (ref 9.0–28.0)
CO2: 26 mEq/L (ref 22–29)
Calcium: 10.2 mg/dL (ref 8.5–10.5)
Chloride: 89 mEq/L — ABNORMAL LOW (ref 100–111)
Creatinine: 4.3 mg/dL — ABNORMAL HIGH (ref 0.7–1.3)
Glucose: 129 mg/dL — ABNORMAL HIGH (ref 70–100)
Potassium: 4 mEq/L (ref 3.5–5.1)
Sodium: 131 mEq/L — ABNORMAL LOW (ref 136–145)

## 2016-07-12 LAB — LACTIC ACID, PLASMA: Lactic Acid: 1.5 mmol/L (ref 0.2–2.0)

## 2016-07-12 LAB — MAGNESIUM: Magnesium: 1.9 mg/dL (ref 1.6–2.6)

## 2016-07-12 MED ORDER — MIDODRINE HCL 5 MG PO TABS
10.0000 mg | ORAL_TABLET | Freq: Three times a day (TID) | ORAL | Status: DC
Start: 2016-07-12 — End: 2016-07-20
  Administered 2016-07-12 – 2016-07-20 (×26): 10 mg via ORAL
  Filled 2016-07-12 (×25): qty 2

## 2016-07-12 MED ORDER — MILRINONE LACTATE IN DEXTROSE 20-5 MG/100ML-% IV SOLN
0.3750 ug/kg/min | INTRAVENOUS | Status: DC
Start: 2016-07-12 — End: 2016-07-20
  Administered 2016-07-12 – 2016-07-20 (×15): 0.375 ug/kg/min via INTRAVENOUS
  Filled 2016-07-12 (×15): qty 100

## 2016-07-12 MED ORDER — POTASSIUM CHLORIDE CRYS ER 20 MEQ PO TBCR
20.0000 meq | EXTENDED_RELEASE_TABLET | Freq: Every day | ORAL | Status: DC
Start: 2016-07-12 — End: 2016-07-13
  Administered 2016-07-12: 20 meq via ORAL
  Filled 2016-07-12: qty 1

## 2016-07-12 MED ORDER — BUMETANIDE 1 MG PO TABS
6.0000 mg | ORAL_TABLET | Freq: Two times a day (BID) | ORAL | Status: DC
Start: 2016-07-12 — End: 2016-07-20
  Administered 2016-07-12 – 2016-07-20 (×13): 6 mg via ORAL
  Filled 2016-07-12 (×8): qty 6
  Filled 2016-07-12: qty 3
  Filled 2016-07-12 (×2): qty 6
  Filled 2016-07-12: qty 3
  Filled 2016-07-12 (×3): qty 6
  Filled 2016-07-12 (×2): qty 3
  Filled 2016-07-12: qty 6

## 2016-07-12 MED ORDER — ASPIRIN 81 MG PO TBEC
81.0000 mg | DELAYED_RELEASE_TABLET | Freq: Every day | ORAL | Status: DC
Start: 2016-07-12 — End: 2016-07-20
  Administered 2016-07-12 – 2016-07-20 (×9): 81 mg via ORAL
  Filled 2016-07-12 (×9): qty 1

## 2016-07-12 MED ORDER — ACETAMINOPHEN 325 MG PO TABS
650.0000 mg | ORAL_TABLET | Freq: Four times a day (QID) | ORAL | Status: DC | PRN
Start: 2016-07-12 — End: 2016-07-20
  Administered 2016-07-12: 650 mg via ORAL
  Filled 2016-07-12: qty 2

## 2016-07-12 MED ORDER — ZOLPIDEM TARTRATE 5 MG PO TABS
5.0000 mg | ORAL_TABLET | Freq: Every evening | ORAL | Status: DC
Start: 2016-07-12 — End: 2016-07-19
  Administered 2016-07-12 – 2016-07-18 (×7): 5 mg via ORAL
  Filled 2016-07-12 (×7): qty 1

## 2016-07-12 MED ORDER — HEPARIN SODIUM (PORCINE) 5000 UNIT/ML IJ SOLN
5000.0000 [IU] | Freq: Two times a day (BID) | INTRAMUSCULAR | Status: DC
Start: 2016-07-12 — End: 2016-07-20
  Administered 2016-07-12 – 2016-07-20 (×13): 5000 [IU] via SUBCUTANEOUS
  Filled 2016-07-12 (×12): qty 1

## 2016-07-12 MED ORDER — CHLOROTHIAZIDE SODIUM 500 MG IV SOLR
500.0000 mg | Freq: Once | INTRAVENOUS | Status: AC
Start: 2016-07-12 — End: 2016-07-12
  Administered 2016-07-12: 500 mg via INTRAVENOUS
  Filled 2016-07-12: qty 500

## 2016-07-12 NOTE — PT Eval Note (Signed)
[facility]   Physical Therapy Evaluation   Patient: AVERI CACIOPPO    MRN#: [id number]   [facility]  Bed: FI262/FI262-01    Discharge Recommendations:   Discharge Recommendation: Home with supervision, Home with home health PT   DME Recommendation: DME Recommended for Discharge: Shower chair    Assessment:   MCKOY BHAKTA is a 71 y.o. male admitted 07/11/2016.  Pt presents with decreased endurance, functional mobility from baseline with current hospitalization. Pt requires CGA-SBA for OOB transfers, ambulation to maintain safety along with cues for hand placement when completing sit>stand transfer. Vitals stable during activity, O2 sats >90% on RA. Pt needs continuous education and cues to remain on task and complete functional mobility. Pt will benefit from continued PT services to optimize independence and overall endurance level prior to D/C.      Impairments: Assessment: Decreased endurance/activity tolerance;Decreased functional mobility;Decreased balance;Gait impairment.     Therapy Diagnosis: impaired functional mobility, decreased endurance    Rehabilitation Potential: Prognosis: Good;With continued PT status post acute discharge    Treatment Activities: evaluation, transfer training, gait training  Educated the patient to role of physical therapy, plan of care, goals of therapy and safety with mobility and ADLs, energy conservation techniques, pursed lip breathing, home safety.    Plan:   Treatment/Interventions: Exercise, Gait training, Stair training, LE strengthening/ROM, Functional transfer training, Bed mobility, Endurance training     PT Frequency: 2-3x/wk   Risks/Benefits/POC Discussed with Pt/Family: With patient        Precautions and Contraindications:   Other Precautions: Falls    Consult received for [name] for PT Evaluation and Treatment.  Patient's medical condition is appropriate for Physical therapy intervention at this time.    Medical  Diagnosis: Oliguria [R34]  Hyponatremia [E87.1]  Uremia [N19]  Acute renal failure superimposed on chronic kidney disease, unspecified CKD stage, unspecified acute renal failure type [N17.9, N18.9]      History of Present Illness:   KACE HARTJE is a 71 y.o. male admitted on 07/11/2016 with "worsening renal function on outpatient labs. 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 He was recently told to stop metolazone by Dr. May. He states that he had been feeling in his normal state of health until this morning when he noted two episodes of NBNB vomiting, neither of which were associated with nausea. He notes that he has had similar episodes in the past, but never more than once in a day. He otherwise states that he feels fine.      Notes that he has been moving around well at home, relies on extra handrail that was placed for the stairwell, but has had stable functional status. States that his lower extremity edema is chronic and actually looks better than usual.      Per his wife, there are some days where he doesn't take all of his medications - which he denies. " Per H&P.       Past Medical/Surgical History:  Past Medical History:   Diagnosis Date   . Arthritis     bilat shoulders, lt knee   . Asthma without status asthmaticus     childhood   . Cardiac amyloidosis    . CHF (congestive heart failure)    . Fracture of unspecified bones     1974 lt shoulder   . Gout    . Malignant neoplasm  Past Surgical History:   Procedure Laterality Date   . ARTHROPLASTY, SHOULDER, TOTAL  06/18/2013    Procedure: ARTHROPLASTY, SHOULDER, TOTAL;  Surgeon: Wendi Snipes, MD;  Location: [facility];  Service: Orthopedics;  Laterality: Left;   . COLONOSCOPY     . EXCISION, GANGLION  12/19/2012    Procedure: EXCISION, GANGLION;  Surgeon: Jillyn Hidden, MD;  Location: [facility];  Service: Orthopedics;  Laterality: Right;   injection right and left shoulder   . HERNIA REPAIR      lt inguinal age 38   . INJECTION, MEDICATION  12/19/2012    Procedure: INJECTION, MEDICATION;  Surgeon: Jillyn Hidden, MD;  Location: [facility];  Service: Orthopedics;  Laterality: Bilateral;        X-Rays/Tests/Labs:  Reviewed - NIL of note   Social History:   Prior Level of Function:  Prior level of function: Independent with ADLs, Ambulates independently  Baseline Activity Level: Community ambulation  Driving: does not drive  DME Currently at Home: Hospital bed    Home Living Arrangements:  Living Arrangements: Spouse/significant other  Type of Home: House  Home Layout: Multi-level, Bed/bath upstairs (flight with B rails)  DME Currently at Home: Hospital bed    Subjective:   Patient is agreeable to participation in the therapy session. Nursing clears patient for therapy.     Patient Goal: to go home    Pain Assessment  Pain Assessment: No/denies pain (gout pain to L hand)    Objective:   Observation of Patient/Vital Signs:  Patient is seated at edge of bed with telemetry, IV access, fall mat in place.    Observation of Patient/Vital signs:       Cognition/Neuro Status  Arousal/Alertness: Appropriate responses to stimuli  Attention Span: Appears intact  Orientation Level: Oriented X4  Memory: Appears intact  Following Commands: Follows one step commands without difficulty  Safety Awareness: minimal verbal instruction        Musculoskeletal Examination:  Gross ROM  Neck/Trunk ROM: within functional limits  Right Upper Extremity ROM: within functional limits (chronic shoulder problem - WFL ROM)  Left Upper Extremity ROM: within functional limits  Right Lower Extremity ROM: within functional limits  Left Lower Extremity ROM: within functional limits    Gross Strength  Neck/Trunk Strength: 4+/5  Right Upper Extremity Strength: within functional limits  Left Upper Extremity Strength: within functional limits  Right Lower Extremity Strength: 4+/5  Left Lower  Extremity Strength: 4+/5    Tone  Tone: within functional limits    Functional Mobility:  Sit to Stand: Contact Guard Assist (cues for hand placement)  Stand to Sit: Contact Guard Assist         Ambulation:  PMP - Progressive Mobility Protocol   PMP Activity: Step 7 - Walks out of Room  Distance Walked (ft) (Step 6,7): 150 Feet x1; 45fx1    Ambulation: Stand by Assist (IV pole with R UE)  Pattern: Step through;shuffle;decreased step length  Vitals stable upon completion of ambulation - O2 sats >90% on RA. Pt reports mild fatigue requiring seated rest breaks between ambulation attempts. No LOB noted, pt demonstrates shuffled pattern - wearing slippers.     Education provided on HEP to complete - pt verbalizes understanding although reluctant to complete.       Balance:  Balance: needs focused assessment  Standing - Static: Good  Standing - Dynamic: Fair (fair+)         Participation and Activity Tolerance:  Participation Effort: good  Endurance: Tolerates 10 - 20 min exercise with multiple rests      Patient left with call bell within reach, all needs met, fall mat, chair alarm, IV access, telemetry in place and all questions answered. RN notified of session outcome and patient response.       Goals:   Goals  Goal Formulation: With patient  Time for Goal Acheivement: 3 visits  Goals: Select goal  Pt Will Go Supine To Sit: with supervision  Pt Will Perform Sit To Supine: with supervision  Pt Will Perform Sit to Stand: with supervision  Pt Will Ambulate: > 200 feet, with supervision  Pt Will Go Up / Down Stairs: 6-10 stairs, with stand by assist, With rail         Time of treatment:   PT Received On: 07/12/16  Start Time: 1102  Stop Time: 1145  Time Calculation (min): 43 min       Darryll Capers, PT, DPT [phone]

## 2016-07-12 NOTE — Consults (Signed)
Advanced Heart Failure and Transplant Consult Note    Heart Failure/Transplant Spectra Link [phone]    Listed for transplant:  No.     Assessment:    71 year old man with AL amyloidosis and nonischemic cardiomyopathy.     Non-ischemic cardiomyopathy, EF 20%, NYHA Class 4, ACC/AHA Stage D due to AL amyloidosis   Acute on chronic systolic heart failure, started on milrinone 0.375 mcg/kg/min 04/2016.   AL Amylodosis, responded to CyBorD (January - August 2016) though relapsed (increased serum free light chains from 35 in October 2017 to 378 in October 2017) and was started on revlimid 5 mg QOD on 04/09/16.   Acute on chronic kidney disease, stage 4, with uremia   Asthma   Osteoarthritis   Hyponatremia, likely hypovolemic   Anorexia   Nausea and vomiting    Recommendations:     Palliative care consultation, recommend hospice given his debilitated state and overall discomfort - his prognosis is extremely poor   Continue bumetanide 6 mg po BID if patient can tolerate   Will give chorothiazide 500 mg IV to potentiate diuresis after discussion with nephrology, will attempt to use diuretics over the weekend and then nephrology will consider dialysis on Monday (noting that there is no dialysis center that will provide outpatient dialysis on milrinone, and that the likelihood of him being able to survive off milrinone is <1% even after substantial dialysis and improvement of uremia).    Continue milrinone 0.375 mcg/kg/min based on his ideal weight, would not recalibrate to current weight in order to continue the same dosing unless he becomes hypotensive   Continue midodrine 10 mg po TID   Reasonable to hold spironolactone 50 mg po daily   He does not have an implantable cardiac device   Agree with nephrology consultation   Oncology consultation with Dr Joneen Caraway to see the patient in the hospital, with thalidomide and decadron - which would not be expected to facilitate any rapid cardiac improvement   Monitor I/O,  daily weights    ---------------------------------------------------------------------------------------------------------  CC:  Acute on chronic systolic heart failure, acute kidney injury on chronic kidney injury, nausea, vomiting, confusion    HPI:  71 year old man with AL amyloidosis and chronic systolic heart failure with chronic kidney disease on palliative home milrinone who presented with increasing confusion, nausea, vomiting and acute kidney injury. He recently saw Dr Joneen Caraway to consider initiation of chemotherpy for his amyloidosis. However according to his wife he is only intermittently taking his medications, and continues to have anorexia and vomiting. At his last clinic visit 07/04/16 his weight had decreased to 120-122 lbs, down from goal 130, with worsening renal function, and his metolazone was decreased from 5 to 2.5 mg. He reports ongoing early satiety, leg edema, can no longer perform ADLs, insomina that did not respond to lunesta.    Today he says he is tired, but that if he is offered dialysis he would try it. If not he would accept that it is his time. He has not had recurrent vomiting now, but has not been eating well.     ROS:  All other systems were reviewed and are otherwise negative except as noted in the HPI.    Consult Requested by:      Patient Active Problem List   Diagnosis   . Ganglion cyst of wrist, right   . Primary localized osteoarthrosis, shoulder region   . Asthma without status asthmaticus   . Acute on chronic combined systolic  and diastolic heart failure   . Elevated troponin   . Bilateral lower extremity edema   . Light chain (AL) amyloidosis   . Hyperkalemia   . Chronic renal failure, stage 3 (moderate)   . Cardiac amyloidosis   . Chronic systolic heart failure   . NICM (nonischemic cardiomyopathy)   . Uremia     Current Meds:    aspirin EC 81 mg Oral Daily   bumetanide 6 mg Oral Q12H   heparin (porcine) 5,000 Units Subcutaneous Q12H [place]   midodrine 10 mg Oral TID MEALS    potassium chloride 20 mEq Oral Daily     Drips:  . milrinone 0.375 mcg/kg/min (07/12/16 0256)     He has No Known Allergies.     Family history: Family history is unknown by patient.    Social history:   Social History     Social History   . Marital status: Married     Spouse name: N/A   . Number of children: N/A   . Years of education: N/A     Occupational History   . Not on file.     Social History Main Topics   . Smoking status: Never Smoker   . Smokeless tobacco: Never Used   . Alcohol use Yes      Comment: rare   . Drug use: No   . Sexual activity: No     Other Topics Concern   . Not on file     Social History Narrative   . No narrative on file     Objective:    Vital signs in last 24 hours:   Temp:  [97.7 F (36.5 C)-98 F (36.7 C)] 98 F (36.7 C)  Heart Rate:  [104-109] 104  Resp Rate:  [13-21] 16  BP: (94-104)/(61-73) 104/73  SpO2: 97 %O2 Device: None (Room air)  Height: 167.6 cm (_0 )  Weight: 57.3 kg (126 lb 4.8 oz); Weight change:   Body mass index is 20.39 kg/m.[name]    Telemetry: no significant events    No intake/output data recorded.                     Physical Exam:   General: cachectic, crying, weak appearing, not distressed  HEENT:  Anicteric sclera, JVP >12 cmH2O, no carotid bruits. Carotid pulses are diminished bilaterally  Lungs: Bilateral decreased breath sounds and crackles with decreased effort  Cardiovascular: soft s1 s2, tachycardic rate, regular rhythm, no m/r/g, PMI laterally displaced  Abdominal: thin, soft, NT/ND, +B.S., no HSM.   Extremities: cool to touch, no cyanosis, 1+ bilateral edema, peripheral pulses are decreased bilaterally.  Neuromuscular exam: grossly non-focal, though not formally tested.    Cardiac Studies:   Echocardiogramon 11/18/17shows a left ventricular end-diastolic dimension of 73GK, concentric LVH (walls 15 mm), global HKwith a left ventricular ejection fraction of 20-25%, and grade 3diastolic dysfunction. The right ventricle is dilated with reduced  systolic function. The left atrium isdilated and the right atrium isdilated. The aortic valve is trileaflet withoutevidence of stenosis; there is trivialinsufficiency. The mitral valve is morphologically normal with mildregurgitation. There is mildtricuspid regurgitation. Pulmonary systolic pressure is estimated to be 55mHg above right atrial pressure (10-1105mg). There is trivialpulmonic insufficiency. A pericardial effusion is notpresent.    ECG Findingson 05/03/16: sinus rhythm, left anterior fascicular block and inferior MI, low voltage. As compared to hisprevious ECG on 11/1/17there is nono significant change.     Right heart catheterizationon 05/07/16 (prior to milrinone)showed  a RA pressure of 8 mmHg, RV pressure of 24/8 mmHg, PA pressure of 29/9/16 mmHg, and a PW pressure of 13 mmHg. PA saturation was 45.8%. Cardiac output by Fick calculation was 1.9 L/min with a corresponding index of 1.2 L/min/m2. Cardiac output by thermodilution was 1.6 L/min with a corresponding index of 0.95 L/min/m2. PVR is 1.6 WU.    CXR 07/11/16: Independently interpreted  No acute process    Lab Results   Component Value Date    WBC 3.52 07/12/2016    HGB 12.4 (L) 07/12/2016    PLT 126 (L) 07/12/2016    NA 131 (L) 07/12/2016    K 4.0 07/12/2016    BUN 123.0 (H) 07/12/2016    CREAT 4.3 (H) 07/12/2016    EGFR 16.7 07/12/2016    MG 1.9 07/12/2016    AST 35 (H) 06/05/2016    ALB 3.4 (L) 06/05/2016    INR 1.3 (H) 04/17/2016    BNP 4,994 (H) 06/05/2016

## 2016-07-12 NOTE — Progress Notes (Signed)
Oliguria [R34]  Hyponatremia [E87.1]  Uremia [N19]  Acute renal failure superimposed on chronic kidney disease, unspecified CKD stage, unspecified acute renal failure type [N17.9, N18.9]    MEDICARE FOCUSED Gregory [name]     Gregory [name] lives at home with his wife. [place] plan is to return home. Gregory [name] has [facility] services through [facility] and [facility] infusion through [facility].     Palliative Care consult has been ordered.      07/12/16 1335   Gregory [name] Type   Within 30 Days of Previous Admission? Yes   Healthcare Decisions   Interviewed: Gregory [name]   Orientation/Decision Making Abilities of Gregory [name] Alert and Oriented x3, able to make decisions   Advance Directive Gregory [name] does not have advance directive   [place] Agent's Name [place] Agent's Phone Number [phone], [phone]   Additional Emergency Contacts? [name] Setterlund-Daughter [phone]   Prior to admission   Prior level of function Independent with ADLs;Ambulates independently   Type of Residence Private residence   Home Layout Multi-level   Have running water, electricity, heat, etc? Yes   Living Arrangements Spouse/significant other   How do you get to your MD appointments? Family   How do you get your groceries? Family   Who fixes your meals? Family   Who does your laundry? Family   Who picks up your prescriptions? Family   DME Currently at [facility]   [place] [facility]   Name of Agency [facility]   Frequency of services weekly   Adult Protective Services (APS) involved? No   Discharge Planning   Support Systems Spouse/significant other;Children   Gregory [name] expects to be discharged to: Home   Anticipated [place] plan discussed with: Same as interviewed   [place] discussion contact information: Same as interviewed   Potential barriers to discharge: Other  (none)   Mode of transportation: Private car (family member)   Consults/Providers   PT Evaluation Needed 1   OT Evalulation  Needed 1   SLP Evaluation Needed 2   Outcome Palliative Care Screen Screened but did not meet criteria for intervention   Correct PCP listed in Epic? Yes   Important Message from [facility] Notice   Gregory [name] received 1st IMM Letter? Yes   Date of most recent IMM given: 07/11/16     Eather Colas, MSW, LSW  Case Management  Care Coordinator  [facility]  T: [phone]

## 2016-07-12 NOTE — OT Eval Note (Signed)
Gregory [name]   Occupational Therapy Evaluation     Patient: Gregory [name]    MRN#: [id number]   [facility]  Bed: FI262/FI262-01                                     Discharge Recommendations:   Discharge Recommendation: Home with home health OT, Home with supervision   DME Recommended for Discharge: Shower chair    If Home with home health OT, Home with supervision recommended discharge disposition is not available, patient will need CGA assist for BADL' s and mobility,  TBD equipment, and SNF.       Assessment:   Gregory [name] is a 71 y.o. male admitted 07/11/2016 with decreased endurance, balance and c/o of gout in fingers limiting ROM and strength.  PT performs standing grooming at sink side with Supervision x5 minutes and ambulates in hallway with SBA to CGA. Pt would continue to benefit from skilled Occupational Therapy services to maximize independent participation in BADL's and assist in patient's return to functional baseline.     Therapy Diagnosis: Decreased balance, decreased strength  Rehabilitation Potential:  Good for stated goals with continued Occupational Therapy treatment intervention    Treatment Activities: OT evaluation, BADL training, pacing. ECT,  patient/family received education and demonstrates understanding and carryover.   Educated the patient to role of occupational therapy, plan of care, goals of therapy and safety with mobility and ADLs, energy conservation techniques, pursed lip breathing, discharge instructions, home safety.    Plan:   OT Frequency Recommended: 1-2x/wk     Treatment/Interventions: ADL training, functional transfer training, modified bed mobility training, education on energy conservation/fall prevention techniques, compensatory technique education, UE strengthening, endurance training, Relaxation/pain management techniques, education on medical equipment beneficial for increasing ADL performance and safety in home  environment.      Risks/benefits/POC discussed: Yes       Precautions and Contraindications:    Fall risk,     Consult received for [name] for OT Evaluation and Treatment.  Patient's medical condition is appropriate for Occupational Therapy intervention at this time.      History of Present Illness:    Gregory [name] is a 70 y.o. male admitted on 07/11/2016 with "worsening renal function on outpatient labs. 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 He was recently told to stop metolazone by Dr. May. He states that he had been feeling in his normal state of health until this morning when he noted two episodes of NBNB vomiting, neither of which were associated with nausea. He notes that he has had similar episodes in the past, but never more than once in a day. He otherwise states that he feels fine.      Notes that he has been moving around well at home, relies on extra handrail that was placed for the stairwell, but has had stable functional status. States that his lower extremity edema is chronic and actually looks better than usual.      Per his wife, there are some days where he doesn't take all of his medications - which he denies. " Per H&P.       Admitting Diagnosis: Oliguria [R34]  Hyponatremia [E87.1]  Uremia [N19]  Acute renal failure superimposed on chronic kidney disease, unspecified CKD stage, unspecified acute renal  failure type [N17.9, N18.9]    Past Medical/Surgical History:  Past Medical History:   Diagnosis Date   . Arthritis     bilat shoulders, lt knee   . Asthma without status asthmaticus     childhood   . Cardiac amyloidosis    . CHF (congestive heart failure)    . Fracture of unspecified bones     1974 lt shoulder   . Gout    . Malignant neoplasm      Past Surgical History:   Procedure Laterality Date   . ARTHROPLASTY, SHOULDER, TOTAL  06/18/2013    Procedure: ARTHROPLASTY, SHOULDER, TOTAL;   Surgeon: Wendi Snipes, MD;  Location: [facility];  Service: Orthopedics;  Laterality: Left;   . COLONOSCOPY     . EXCISION, GANGLION  12/19/2012    Procedure: EXCISION, GANGLION;  Surgeon: Jillyn Hidden, MD;  Location: [facility];  Service: Orthopedics;  Laterality: Right;  injection right and left shoulder   . HERNIA REPAIR      lt inguinal age 62   . INJECTION, MEDICATION  12/19/2012    Procedure: INJECTION, MEDICATION;  Surgeon: Jillyn Hidden, MD;  Location: [facility];  Service: Orthopedics;  Laterality: Bilateral;       Imaging/Tests/Labs:  Xr Chest  Ap Portable    Result Date: 07/11/2016    Negative for acute process Gregory Oz, MD 07/11/2016 8:37 PM      Social History:   Prior Level of Function:  Prior level of function: Independent with ADLs, Ambulates independently  Baseline Activity Level: Community ambulation  Driving: does not drive  DME Currently at Home: [name] bed     Home Living Arrangements:  Living Arrangements: Spouse/significant other  Type of Home: House  Home Layout: Multi-level, Bed/bath upstairs (flight with B rails)  DME Currently at Home: [name] bed (per case managers chart)       Subjective: none reported     Patient is agreeable to participation in the therapy session. Nursing clears patient for therapy.     Patient Goal: None reported  Pain:   Scale: not rated on scale  Location: left hand   Intervention:  RN made aware, positioning and rest     Objective:   Patient is in bed with peripheral IV in place.      Cognitive Status and Neuro Exam:   Alert and oriented x 4, flat affect     Musculoskeletal Examination:  RUE ROM:  shoulder flexion limited to 110 degrees. Otherwise, WFL grossly   LUE ROM: shoulder flexion limited to 130 degrees. Otherwise, WFL grossly   RLE ROM: WFL  grossly   LLE ROM: WFL grossly     RUE Strength: 3+ /5 grossly   LUE Strength: 3+ /5 grossly   RLE Strength: 4/5 grossly   LLE Strength: 4/5 grossly     Sensory/Oculomotor Examination:  Auditory:   appears WFL, pt able to hear writer without noted difficulty   Vision: corrective lenses,  appears WFL; no deficits noted in acuity or tracking.    Tactile:  Reports  grossly intact  Coordination:decreased gross grasp to LUE, dominant hand        Activities of Daily Living:  Eating:  hand to mouth WFL   Grooming:  Sink side SBA   Bathing:  Min A   UE Dressing:  SBA   LE Dressing:  CGA  Toileting: CGA     Functional Mobility:  Supine to Sit: SBA  Sit to Stand: SBA  Transfers: CGA   PMP Activity: Step 7 - Walks out of Room    Balance:  Static Sitting: mod I EOB   Dynamic Sitting: SBA EOB   Static Standing:  SBA EOB   Dynamic Standing:  CGA   Participation and Activity Tolerance:  Participation Effort: good  Endurance:   Fair +    Patient left with call bell within reach, all needs met, SCDs on , fall mat  In place , bed alarm  N/A , chair alarm  On  and all questions answered. RN notified of session outcome and patient response.       Goals:  )Time For Goal Achievement: 5 visits  ADL Goals  Patient will dress lower body: Independent, 5 visits  Mobility and Transfer Goals  Pt will perform functional transfers: Independent, 5 visits  Pt will perform shower transfer: Independent, 5 visits                         Glory Buff OTR/L   Pager # [phone]    Time of treatment:   OT Received On: 07/12/16  Start Time: 1100  Stop Time: 1145  Time Calculation (min): 45 min

## 2016-07-12 NOTE — Consults (Signed)
Consultation    Date Time: 07/12/16 9:49 AM  Patient Name: Gregory [name]  Requesting Physician: Lesly Rubenstein, MD       Reason for Consultation:   Consulted by Dr. Haig Prophet for AKI     PMh/Sh/Fh reviewed with the patient        History:   Gregory [name] is a 71 y.o. male who presents to the hospital on 07/11/2016 with nausea, vomiting. Patient states he has been doing well until the day of admission. He has noticed increased fatigue, decreased appetite. He is complaining of insomnia. Patient does complain of itchiness and hiccups.     No complaints of chest pain, shortness of breath.     Renal history: Discharge creatinine was 3.9 Recently stopped on his zaroxolyn.      Past Medical History:     Past Medical History:   Diagnosis Date   . Arthritis     bilat shoulders, lt knee   . Asthma without status asthmaticus     childhood   . Cardiac amyloidosis    . CHF (congestive heart failure)    . Fracture of unspecified bones     1974 lt shoulder   . Gout    . Malignant neoplasm        Past Surgical History:     Past Surgical History:   Procedure Laterality Date   . ARTHROPLASTY, SHOULDER, TOTAL  06/18/2013    Procedure: ARTHROPLASTY, SHOULDER, TOTAL;  Surgeon: Wendi Snipes, MD;  Location: [facility];  Service: Orthopedics;  Laterality: Left;   . COLONOSCOPY     . EXCISION, GANGLION  12/19/2012    Procedure: EXCISION, GANGLION;  Surgeon: Jillyn Hidden, MD;  Location: [facility];  Service: Orthopedics;  Laterality: Right;  injection right and left shoulder   . HERNIA REPAIR      lt inguinal age 57   . INJECTION, MEDICATION  12/19/2012    Procedure: INJECTION, MEDICATION;  Surgeon: Jillyn Hidden, MD;  Location: [facility];  Service: Orthopedics;  Laterality: Bilateral;       Family History:   No history of kidney disease      Social History:     Social History     Social History   . Marital status: Married     Spouse name: N/A   . Number of children: N/A   . Years of education: N/A     Social  History Main Topics   . Smoking status: Never Smoker   . Smokeless tobacco: Never Used   . Alcohol use Yes      Comment: rare   . Drug use: No   . Sexual activity: No     Other Topics Concern   . Not on file     Social History Narrative   . No narrative on file       Allergies:   No Known Allergies    Medications:     Current Facility-Administered Medications   Medication Dose Route Frequency   . aspirin EC  81 mg Oral Daily   . bumetanide  6 mg Oral Q12H   . heparin (porcine)  5,000 Units Subcutaneous Q12H SCH   . midodrine  10 mg Oral TID MEALS   . potassium chloride  20 mEq Oral Daily   . zolpidem  5 mg Oral QHS         . milrinone 0.375 mcg/kg/min (07/12/16 0256)       Prior  to Admission medications    Medication Sig Start Date End Date Taking? Authorizing Provider   aspirin EC 81 MG EC tablet Take 81 mg by mouth daily.   Yes [provider]   azelastine (ASTELIN) 0.1 % nasal spray 1 spray by Nasal route 2 (two) times daily. 06/13/16  Yes [provider]   bumetanide (BUMEX) 2 MG tablet Take 3 tablets (6 mg total) by mouth 2 (two) times daily. 06/14/16  Yes May, Jacklynn Bue, MD   eszopiclone (LUNESTA) 2 MG tablet Take 2 mg by mouth nightly. 06/26/16  Yes [provider]   midodrine (PROAMATINE) 10 MG tablet Take 1 tablet (10 mg total) by mouth 3 (three) times daily with meals. 06/20/16  Yes Alphonzo Severance, NP   milrinone [facility]) 20-5 MG/100ML-% infusion Infuse 23.0625 mcg/min into the vein continuous. 05/13/16  Yes Tirrell, Ashley Mariner, MD   potassium chloride (K-DUR,KLOR-CON) 20 MEQ tablet Take 1 tablet (20 mEq total) by mouth every other day.  Patient taking differently: Take 20 mEq by mouth daily.     06/21/16  Yes Alphonzo Severance, NP   spironolactone (ALDACTONE) 50 MG tablet Take 1 tablet (50 mg total) by mouth daily. 06/20/16  Yes Alphonzo Severance, NP        Review of Systems:   A comprehensive review of systems was: General ROS: positive for  - fatigue  negative for -  chills, fever, weight gain or weight loss  ENT ROS: negative for - sore throat  Gastrointestinal ROS: negative for - abdominal pain or diarrhea  Genito-Urinary ROS: no dysuria, trouble voiding, or hematuria  Musculoskeletal ROS: negative for - joint pain or muscle pain  Neurological ROS: negative for - headaches  Dermatological ROS: negative for rash  rest of review of systems are negative    Physical Exam:   Patient Vitals for the past 24 hrs:   BP Temp Temp src Pulse Resp SpO2 Height Weight   07/12/16 0833 103/74 98 F (36.7 C) Oral (!) 103 16 98 % - -   07/12/16 0435 104/73 98 F (36.7 C) Oral (!) 104 16 97 % - -   07/12/16 0233 99/66 97.7 F (36.5 C) Oral (!) 104 16 100 % 1.676 m (_0 ) 57.3 kg (126 lb 4.8 oz)   07/12/16 0130 - - - (!) 106 15 - - -   07/12/16 0100 - - - (!) 106 14 - - -   07/11/16 2315 98/67 - - (!) 104 14 94 % - -   07/11/16 2300 99/69 - - (!) 107 21 96 % - -   07/11/16 2230 96/61 - - (!) 109 19 - - -   07/11/16 2100 94/63 - - (!) 104 13 - - -   07/11/16 2051 103/72 - - (!) 105 16 - - -   07/11/16 1929 102/70 98 F (36.7 C) Temporal Art (!) 109 16 97 % 1.676 m (_1 ) 56.2 kg (124 lb)       Intake and Output Summary (Last 24 hours) at Date Time    Intake/Output Summary (Last 24 hours) at 07/12/16 0949  Last data filed at 07/12/16 0944   Gross per 24 hour   Intake              320 ml   Output              240 ml   Net  80 ml       No intake/output data recorded.    General appearance - alert, well appearing, and in no distress  Mental status - alert, oriented to person, place, and time  Mouth - mucous membranes moist, pharynx normal without lesions  Neck - supple, no significant adenopathy  Chest - clear to auscultation, no wheezes, rales or rhonchi, symmetric air entry  Heart - S1 and S2 normal  Abdomen - soft, no tenderness  Neurological - alert, normal speech  Extremities - pedal edema 3 +, no clubbing or cyanosis    Labs Reviewed:     Recent Labs  Lab 07/12/16  0509  07/11/16  1958   WBC 3.52 4.14   RBC 3.81* 3.81*   Hgb 12.4* 12.4*   Hematocrit 37.5* 36.7*   MCV 98.4 96.3   MCHC 33.1 33.8   RDW 13 13   MPV 11.0 10.9   Platelets 126* 129*         Recent Labs  Lab 07/12/16  0509 07/11/16  1958   Sodium 131* 128*   Potassium 4.0 4.0   Chloride 89* 89*   CO2 26 22   BUN 123.0* 133.0*   Creatinine 4.3* 4.3*   Glucose 129* 101*   Calcium 10.2 10.4   Magnesium 1.9 1.7                                             Rads:   Radiological Procedure reviewed.     Signed by: [name], MD      Assessment/ Plan:   1) Renal: AKI on CKD, possible progression of myeloma kidney, cardio-renal syndrome or over diuresis. No indication of dialysis. I had a long discussion with the patient about starting dialysis if patient is to continue chemotherapy. I have discussed with hematology and cardiac transplant about plan. Currently we will diuresis with bumex and zaroxolyn. If symptoms do not improve, HD on Monday. If patient cannot be weaned of milrinone or does not tolerate HD patient will probably need to have a discussion about hospice. Poor prognosis.     2) Hypotension: On midodrine.     3) Anemia of CKD and myeloma. No intervention at this time.     4) Hyponatremia: Related volume overload. Continue Bumex 6 mg PO bid.        [name], MD  [facility]  Spectra# [phone]  Pg# [phone]  Office # [phone]  After 430 PM, please call the office for person on call  On Weekends, call the office for the person on call            Thank you very much for your consult. Will follow patient closely. Please feel free to contact with any question.       CC:  [name], [name], MD

## 2016-07-12 NOTE — Plan of Care (Signed)
0300-pt admitted from ED with elevated BUN/creatinine,vomiting x2 yesterday,decreased urine output,BP 99/66,ST low 100s,pt on home milrinone infusion via RUA PICC line,AOx4,RA sat 97-100%,voided once in ED,Milrinone drip resumed here at 0.31mg/kg/min=6.9cc/hr-per MED resident just keep drip with the ideal weight and they will look at it.

## 2016-07-12 NOTE — Plan of Care (Addendum)
[place] received for goals of care - seen by [facility] on prior hospital stay. Patient has multiple consultants, and plan of care is evolving. Discussed with Dr. Reynold Bowen and plan to discuss with Dr. Joneen Caraway.     Patient has been seen by Dr. Arlan Organ for possible chemo for his underlying AL Amyloidosis, and is admitted now with uremia and progressive renal failure. Nephrology is considering hemodialysis based upon the potential of oncology  providing chemotherpay, however no outpatient hemodialysis options exist for a patient on Milrinone, so HD would need to be temporary in nature with a clearly defined time limited trial.    I have asked for Dr. Arlan Organ to see the patient to determine appropriate next steps in treatment and will hold on formal consult until options are clear.    ADDENDUM:  Spoke with Dr. Arlan Organ regarding treatment plan. Currently planning to start decadron and thalidomide, though has not started yet. Primary focus of this therapy is to prevent progression and overall improvement / recovery is not anticipated. Additionally, any response will be over weeks to months and thus is not anticipated to be immediate.    This information should be taken into consideration before renal replacement therapy is started, as there may not be options for RRT outside the hospital.       A total of 35 minutes were spent in extensive review of hospital records and communication with Dr. Reynold Bowen, and Dr. Arlan Organ regarding treatment plan and limitations, expectation of prognosis, and options for care.    Will plan to see patient in the next few days as plan is evolving.      [name]. [name], [facility]  Team Pager [phone] available 24/7  Coordinator [place] [phone] between 9-4:30pm M-F

## 2016-07-12 NOTE — Progress Notes (Signed)
MEDICINE PROGRESS NOTE    Date Time: 07/12/16 9:10 AM  Patient Name: Gregory [name]  Attending Physician: Lesly Rubenstein, MD    Assessment:   Active Problems:    Uremia  Resolved Problems:    * No resolved hospital problems. *    Gregory [name] is a 71 y.o. male with PMH of NICM (EF 20-25%) secondary to AL amyloidosis (on milrinone), CKD, HTN and asthma who presents with AKI and admitted for optimization of renal function.     Plan:   #AKI on CKD - Cardio-renal syndrome vs. Progression of Amyloidosis. Creatinine up to 4.3, Creatinine Clearance: 13.1 mL/min  - Hold spirolactone  - Continue Bumex 6 mg q 12h  - F/u Nephrology consult Dr. Wyatt Haste    # H/o NICM HFrEF w/ EF 20-25% on last ECHO 11/19, secondary to AL amyloid, Followed by Dr. Lu Duffel, whom he saw last week (metolazone discontinued). S/p admission on 12/20-12/28 for volume overload and treated with Bumex gtt.   - F/u Advanced HF, apprec recs  - Daily standing weight  - Strict I/Os  - Fluid restrict to 1.5 L/day and Na 2 g/day  - Continue home Bumex 6 mg q 12 h  - Continue continuous home milrinone infusion 0.375 mcg/kg/min  - F/u LFT  - F/u Lactate    #H/o AL cardiac amyloidosis - Followed by locally by Dr. Joneen Caraway ([facility]). Previously treated with 10 cycles CyBorD completed in August, In October he received a 4 week course of Revlimid 5 mg daily with dexamethasone, complicated by fluid retention  - F/u Oncology recs    # Vomiting, resolved  - Continue to monitor    # Hyponatremia, may be 2/2 renal failure  - Monitor for now    # Hypotension  - Continue home midodrine TID    #Global  F -   Current Facility-Administered Medications   Medication   . milrinone     E - replete PRN  N - Diet cardiac Na restriction, if any: 2 GM NA; Fluid restriction: 1500 ML FLUID  DVT ppx - Heparin subq q12  GI ppx - not indicated  PT/OT - ordered  CODE - Full Code    Case discussed with: Dr. Haig Prophet, Hilton Cork, MD    Safety Checklist:     DVT prophylaxis:  CHEST guideline  (See page [phone]) Chemical   Foley:  Bell Rn Foley protocol Not present   IVs:  Peripheral IV   PT/OT: Ordered   Daily CBC & or Chem ordered:  SHM/ABIM guidelines (see #5) Yes, due to clinical and lab instability   Reference for approximate charges of common labs: CBC auto diff - $76  BMP - $99  Mg - $79    Lines:     Patient Lines/Drains/Airways Status    Active PICC Line / CVC Line / PIV Line / Drain / Airway / Intraosseous Line / Epidural Line / ART Line / Line / Wound / Pressure Ulcer / NG/OG Tube     Name:   Placement date:   Placement time:   Site:   Days:    PICC Single Lumen Right Other (Comment)          Other (Comment)        Peripheral IV 07/11/16 Right Forearm  07/11/16    2002    Forearm    less than 1                 Disposition: (Please see  PAF column for Expected D/C Date)   Today's date: 07/12/2016  Admit Date: 07/11/2016  8:41 PM  LOS: 1    Subjective       HPI/Subjective: Patient states that he has not been feeling himself lately. He feels a bit weaker. He also has some pain in his L hand. He denies any nausea, vomiting at this time. He denies any SOB, chest pain, palpitations.      Physical Exam:     VITAL SIGNS PHYSICAL EXAM   Temp:  [97.7 F (36.5 C)-98 F (36.7 C)] 98 F (36.7 C)  Heart Rate:  [103-109] 103  Resp Rate:  [13-21] 16  BP: (94-104)/(61-74) 103/74      No intake or output data in the 24 hours ending 07/12/16 0910 Physical Exam  General: awake, alert X 3, in NAD  HEENT: JVD present  Cardiovascular: regular rate and rhythm, no murmurs, rubs or gallops  Lungs: clear to auscultation bilaterally, without wheezing, rhonchi, or rales  Abdomen: soft, non-tender, non-distended; no palpable masses,  normoactive bowel sounds  Extremities: 2+ pitting edema in bilateral LE, warm and well perfused       Meds:     Medications were reviewed:  Current Facility-Administered Medications   Medication Dose Route Frequency   . aspirin EC  81 mg Oral Daily   . bumetanide  6 mg Oral Q12H   . heparin  (porcine)  5,000 Units Subcutaneous Q12H SCH   . midodrine  10 mg Oral TID MEALS   . potassium chloride  20 mEq Oral Daily   . zolpidem  5 mg Oral QHS       Labs:     Labs (last 72 hours):      Recent Labs  Lab 07/12/16  0509 07/11/16  1958   WBC 3.52 4.14   Hgb 12.4* 12.4*   Hematocrit 37.5* 36.7*   Platelets 126* 129*            Recent Labs  Lab 07/12/16  0509 07/11/16  1958   Sodium 131* 128*   Potassium 4.0 4.0   Chloride 89* 89*   CO2 26 22   BUN 123.0* 133.0*   Creatinine 4.3* 4.3*   Calcium 10.2 10.4   Glucose 129* 101*                     Microbiology Results     None            Imaging, reviewed and are significant for:  Xr Chest  Ap Portable    Result Date: 07/11/2016    Negative for acute process [name], MD 07/11/2016 8:37 PM    Discussed with Dr. Haig Prophet    Signed by: Carmine Savoy, DO PGY1

## 2016-07-12 NOTE — Plan of Care (Signed)
Problem: Safety  Goal: Patient will be free from injury during hospitalization  Outcome: Progressing  Goal: Patient will be free from infection during hospitalization  Outcome: Progressing     Problem: Psychosocial and Spiritual Needs  Goal: Demonstrates ability to cope with hospitalization/illness  Outcome: Progressing

## 2016-07-12 NOTE — Plan of Care (Signed)
Problem: Safety  Goal: Patient will be free from injury during hospitalization  Outcome: Not Progressing   07/12/16 1752   Goal/Interventions addressed this shift   Patient will be free from injury during hospitalization  Assess patient's risk for falls and implement fall prevention plan of care per policy;Provide and maintain safe environment;Use appropriate transfer methods;Ensure appropriate safety devices are available at the bedside;Include patient/ family/ care giver in decisions related to safety;Hourly rounding;Assess for patients risk for elopement and implement Elopement Risk Plan per policy;Provide alternative method of communication if needed (communication boards, writing)       Problem: Psychosocial and Spiritual Needs  Goal: Demonstrates ability to cope with hospitalization/illness  Outcome: Progressing   07/12/16 1752   Goal/Interventions addressed this shift   Demonstrates ability to cope with hospitalizations/illness Encourage verbalization of feelings/concerns/expectations;Provide quiet environment;Assist patient to identify own strengths and abilities;Encourage patient to set small goals for self;Encourage participation in diversional activity;Reinforce positive adaptation of new coping behaviors;Include patient/ patient care companion in decisions;Communicate referral to spiritual care as appropriate       Comments: Pt mostly AxOx3, intermittent confused this morning, sometimes forgetful.  SBP soft 90-100s.  After Chlorothiazide, SBP 80s, pt denied dizziness or lightheaded, he is on scheduled Midodrine. Resident paged for BP issue.  SR/ST 90-100s.  Continue on Primacor gtt at 0.375 mcg/kg/min via PICC.  PICC dressing intact, last changed 1/23.  Per Resident, no blood draw from PICC.  Tolerated PO, very poor appetite, on 1500cc fluid restriction.  Voided small amount urine, one BM this shift.  No family at bedside.  Palliative care consulted. Ambulated in hall with standby assist.  Reminded pt to  call for assist.  Chair/bed alarm on.  Safety precaution in place.  Continue to monitor.

## 2016-07-12 NOTE — UM Notes (Signed)
Admit to inpatient status on 07/11/16 @ 2226    Patient is a 71 year old male with h/o  NICM (EF 20-25%) secondary to AL amyloidosis (on milrinone), CKD, HTN and asthma who presents with AKI on CKD noted on outpatient labs as well as two episodes of vomiting today.  Pt was referred to ED by his cardiologist with creatine levels of 3.8 and BUN levels of 122.  Caretaker noticed decreased alertness and intermittent confusion.  Several episodes vomiting today, nonbloody/nonbilious.  Has had decreased urine output x1week gradually, with a darker noticed color. Caretaker notes decreased liquid intake.     VS: T 98, HR 104-109, RR 13-21, bp 94/63-103/72, Pox 94-97% RA    Labs:  BUN 133.0, 123.0, Cr 4.3, 4.3, Na 128, 131    CXR: neg    1/25 Admit to [facility]  Cardiac tele  Milrinone gtt    1/25 A/P per Medicine:  #AKI on CKD - Concern for cardio-renal syndrome given advanced heart failure, however can't rule out progression of amyloidosis.    - Labile Cr level ranging from 3.2-5.1 over past two months.   - Estimated Creatinine Clearance: 13.1 mL/min (based on SCr of 4.3 mg/dL (H)).  - Hold spirolactone for now (may be restarted if advanced heart feels that it is more beneficial to have on board), but will continue Bumex given complex cardiac condition.   #Stage D NICM EF 20-25%, secondary to AL amyloid - NYHA functional class III-IV. On milrinone.  - No signs of worsened volume overload on exam. Was admitted from 12/20-12/28 for volume overload and treated with Bumex gtt.   - Consult advanced heart failure in morning, appreciate their recommendations  - Daily standing weight, strict I/Os  - Fluid restrict to 1.5 L/day and Na 2 g/day  - Continue home Bumex 6 mg BID, continue to hold metoloazone  - Continue home milrinone dose    - Was previously followed by Palliative Care  #Vomiting - Improved.   - Patient states its not abnormal for him to have intermittent vomiting, but he has never vomited twice in the same day (like  today). No obvious infectious component at this time. No complaints of nausea.   - Monitor for now, may need antiemetic if develops symptoms   #Acute on chronic hyponatremia - Chronically low, often at 130 or below  - Monitor for now  #AL amyloidosis - Followed by locally by Dr. Joneen Caraway ([facility]). Previously with care at [facility].  Received 4-week course of Revlimid with dexamethasone in October, complicated by fluid retention.   - Was to follow-up as an outpatient for further treatment options   - Consider consultation should need to address as inpatient  #Hypotension  - Continue home midodrine TID  #Global:  F - None  E - K>4, Mg>2  N - Cardiac          Eloy End, BSN, RN, ACM          [employer]ization Review Case Manager  [employer]  ph: [phone]

## 2016-07-13 DIAGNOSIS — Z7189 Other specified counseling: Secondary | ICD-10-CM

## 2016-07-13 DIAGNOSIS — Z66 Do not resuscitate: Secondary | ICD-10-CM

## 2016-07-13 LAB — CBC
Absolute NRBC: 0 10*3/uL
Hematocrit: 35.9 % — ABNORMAL LOW (ref 42.0–52.0)
Hgb: 11.9 g/dL — ABNORMAL LOW (ref 13.0–17.0)
MCH: 32.2 pg — ABNORMAL HIGH (ref 28.0–32.0)
MCHC: 33.1 g/dL (ref 32.0–36.0)
MCV: 97.3 fL (ref 80.0–100.0)
MPV: 10.8 fL (ref 9.4–12.3)
Nucleated RBC: 0 /100 WBC (ref 0.0–1.0)
Platelets: 133 10*3/uL — ABNORMAL LOW (ref 140–400)
RBC: 3.69 10*6/uL — ABNORMAL LOW (ref 4.70–6.00)
RDW: 13 % (ref 12–15)
WBC: 3.27 10*3/uL — ABNORMAL LOW (ref 3.50–10.80)

## 2016-07-13 LAB — GFR: EGFR: 19.2

## 2016-07-13 LAB — BASIC METABOLIC PANEL
BUN: 119 mg/dL — ABNORMAL HIGH (ref 9.0–28.0)
CO2: 27 mEq/L (ref 22–29)
Calcium: 10 mg/dL (ref 8.5–10.5)
Chloride: 90 mEq/L — ABNORMAL LOW (ref 100–111)
Creatinine: 3.8 mg/dL — ABNORMAL HIGH (ref 0.7–1.3)
Glucose: 98 mg/dL (ref 70–100)
Potassium: 3.4 mEq/L — ABNORMAL LOW (ref 3.5–5.1)
Sodium: 132 mEq/L — ABNORMAL LOW (ref 136–145)

## 2016-07-13 LAB — MAGNESIUM: Magnesium: 1.8 mg/dL (ref 1.6–2.6)

## 2016-07-13 MED ORDER — MAGNESIUM SULFATE IN D5W 1-5 GM/100ML-% IV SOLN
1.0000 g | Freq: Once | INTRAVENOUS | Status: AC
Start: 2016-07-13 — End: 2016-07-13
  Administered 2016-07-13: 1 g via INTRAVENOUS
  Filled 2016-07-13: qty 100

## 2016-07-13 MED ORDER — CHLOROTHIAZIDE SODIUM 500 MG IV SOLR
500.0000 mg | Freq: Once | INTRAVENOUS | Status: DC
Start: 2016-07-13 — End: 2016-07-18
  Filled 2016-07-13: qty 500

## 2016-07-13 MED ORDER — POTASSIUM CHLORIDE CRYS ER 20 MEQ PO TBCR
20.0000 meq | EXTENDED_RELEASE_TABLET | Freq: Every day | ORAL | Status: DC
Start: 2016-07-13 — End: 2016-07-16
  Administered 2016-07-13 – 2016-07-15 (×3): 20 meq via ORAL
  Filled 2016-07-13 (×3): qty 1

## 2016-07-13 MED ORDER — POTASSIUM CHLORIDE 10 MEQ/100ML IV SOLN
10.0000 meq | INTRAVENOUS | Status: DC
Start: 2016-07-13 — End: 2016-07-13

## 2016-07-13 NOTE — Plan of Care (Signed)
Problem: Safety  Goal: Patient will be free from injury during hospitalization  Outcome: Progressing   07/13/16 0047   Goal/Interventions addressed this shift   Patient will be free from injury during hospitalization  Assess patient's risk for falls and implement fall prevention plan of care per policy;Provide and maintain safe environment;Use appropriate transfer methods;Ensure appropriate safety devices are available at the bedside;Include patient/ family/ care giver in decisions related to safety;Hourly rounding;Assess for patients risk for elopement and implement Elopement Risk Plan per po   07/13/16 0047   Goal/Interventions addressed this shift   Patient will be free from injury during hospitalization  Assess patient's risk for falls and implement fall prevention plan of care per policy;Provide and maintain safe environment;Use appropriate transfer methods;Ensure appropriate safety devices are available at the bedside;Include patient/ family/ care giver in decisions related to safety;Hourly rounding;Assess for patients risk for elopement and implement Elopement Risk Plan per policy   licy     Goal: Patient will be free from infection during hospitalization  Outcome: Progressing   07/13/16 0047   Goal/Interventions addressed this shift   Free from Infection during hospitalization Assess and monitor for signs and symptoms of infection;Monitor lab/diagnostic results;Monitor all insertion sites (i.e. indwelling lines, tubes, urinary catheters, and drains);Encourage patient and family to use good hand hygiene technique       Problem: Psychosocial and Spiritual Needs  Goal: Demonstrates ability to cope with hospitalization/illness  Outcome: Progressing   07/13/16 0047   Goal/Interventions addressed this shift   Demonstrates ability to cope with hospitalizations/illness Encourage verbalization of feelings/concerns/expectations;Provide quiet environment;Assist patient to identify own strengths and abilities;Encourage  patient to set small goals for self;Encourage participation in diversional activity;Reinforce positive adaptation of new coping behaviors;Include patient/ patient care companion in decisions       Comments: Patient alert and oriented times three, vital signs stable, tele sinus tachy heart rate in the 100's. Patient on primacor drip infusing at 0.344mg/kg/min. Voiding small amounts of urine at a time. Assistance offered as needed.Denies any complaints of pain or discomfort at this time, will continue to monitor by hourly rounding, call bell placed within patient's reach, and safety precautions in place.

## 2016-07-13 NOTE — Progress Notes (Signed)
PROGRESS NOTE    Date Time: 07/13/16 5:20 PM  Patient Name: Gregory [name], Gregory [name]                                NEPHROLOGY PROGRESS NOTE    Follow up for acute renal failure  Subjective:   Patient states he feels the same.     Medications:      Scheduled Meds: PRN Meds:      aspirin EC 81 mg Oral Daily   bumetanide 6 mg Oral Q12H   chlorothiazide 500 mg Intravenous Once   heparin (porcine) 5,000 Units Subcutaneous Q12H SCH   midodrine 10 mg Oral TID MEALS   potassium chloride 20 mEq Oral Daily   zolpidem 5 mg Oral QHS       Continuous Infusions:  . milrinone 0.375 mcg/kg/min (07/13/16 1646)      acetaminophen 650 mg Q6H PRN         Review of Systems:    No complaints of chest pain, palpitations shortness of breath, cough, headaches, dizziness, joint pain, abdominal pain, n or fevers.     Physical Exam:   Patient Vitals for the past 24 hrs:   BP Temp Temp src Pulse Resp SpO2 Weight   07/13/16 1655 92/63 - - 100 - - -   07/13/16 1603 (!) 85/56 97.7 F (36.5 C) Oral (!) 104 20 96 % -   07/13/16 1105 - - - - - 91 % -   07/13/16 1102 (!) 89/58 97.5 F (36.4 C) Oral (!) 102 20 (!) 70 % -   07/13/16 0900 95/62 - - 95 - - -   07/13/16 0747 93/59 97.3 F (36.3 C) Oral 97 20 98 % -   07/13/16 0346 91/65 97.3 F (36.3 C) Oral 97 20 100 % 56.8 kg (125 lb 4.8 oz)   07/12/16 2310 99/68 97.3 F (36.3 C) Oral 100 18 97 % -   07/12/16 1918 90/58 97.4 F (36.3 C) Oral (!) 101 18 100 % -   07/12/16 1746 (!) 88/59 - - (!) 102 - - -       Intake and Output Summary (Last 24 hours) at Date Time    Intake/Output Summary (Last 24 hours) at 07/13/16 1720  Last data filed at 07/13/16 1646   Gross per 24 hour   Intake              670 ml   Output             1135 ml   Net             -465 ml       General appearance - alert, well appearing, and in no distress  Mental status - alert, oriented to person, place, and time  Chest - clear to auscultation, no wheezes, rales or rhonchi, symmetric air entry  Heart - S1 and S2 normal  Abdomen - soft,  no tenderness  Neurological - alert, normal speech  Extremities - pedal edema 3 +, no clubbing or cyanosis    Labs:     Recent Labs  Lab 07/13/16  0339 07/12/16  0509 07/11/16  1958   WBC 3.27* 3.52 4.14   RBC 3.69* 3.81* 3.81*   Hgb 11.9* 12.4* 12.4*   Hematocrit 35.9* 37.5* 36.7*   MCV 97.3 98.4 96.3   MCHC 33.1 33.1 33.8   RDW _0 MPV  10.8 11.0 10.9   Platelets 133* 126* 129*           Recent Labs  Lab 07/13/16  0339 07/12/16  0509 07/11/16  1958   Sodium 132* 131* 128*   Potassium 3.4* 4.0 4.0   Chloride 90* 89* 89*   CO2 _0 BUN 119.0* 123.0* 133.0*   Creatinine 3.8* 4.3* 4.3*   Glucose 98 129* 101*   Calcium 10.0 10.2 10.4   Magnesium 1.8 1.9 1.7         Results     Procedure Component Value Units Date/Time    Basic Metabolic Panel [[id number]]  (Abnormal) Collected:  07/13/16 0339    Specimen:  Blood Updated:  07/13/16 0448     Glucose 98 mg/dL      BUN 119.0 (H) mg/dL      Creatinine 3.8 (H) mg/dL      Calcium 10.0 mg/dL      Sodium 132 (L) mEq/L      Potassium 3.4 (L) mEq/L      Chloride 90 (L) mEq/L      CO2 27 mEq/L     Magnesium [[id number]] Collected:  07/13/16 0339    Specimen:  Blood Updated:  07/13/16 0448     Magnesium 1.8 mg/dL     GFR [[id number]] Collected:  07/13/16 0339     Updated:  07/13/16 0448     EGFR 19.2    CBC [[id number]]  (Abnormal) Collected:  07/13/16 0339    Specimen:  Blood from Blood Updated:  07/13/16 0416     WBC 3.27 (L) x10 3/uL      Hgb 11.9 (L) g/dL      Hematocrit 35.9 (L) %      Platelets 133 (L) x10 3/uL      RBC 3.69 (L) x10 6/uL      MCV 97.3 fL      MCH 32.2 (H) pg      MCHC 33.1 g/dL      RDW 13 %      MPV 10.8 fL      Nucleated RBC 0.0 /100 WBC      Absolute NRBC 0.00 x10 3/uL                 Rads:   Radiological Procedure reviewed.    Signed by: [name], MD    Assessment:   1) Renal: AKI, with uremic symptoms. I have had a long discussion with patient today about the poor prognosis of HD is for him. I explained to him he may not tolerate it, that  we may not able to do it as an outpatient, or he may feel worse after the procedure. Continue oral diuretics and diuril if BP tolerates    2) Hypotension: on midodrine.     3) Poor prognosis.     Plan:   Continue diuretics  Patient still wanted to try dialysis      [name], MD  [facility]  Spectra# [phone]  Pg# [phone]  Office # [phone]  After 430 PM, please call the office for person on call  On Weekends, call the office for the person on call

## 2016-07-13 NOTE — Progress Notes (Signed)
HEMATOLOGY ONCOLOGY PROGRESS NOTE  [facility] - [place]   [phone]) [phone]        Date Time: 07/13/16 1:34 PM  Patient Name: Gregory [name], Gregory [name]    Active Problems:    Uremia    Palliative care by specialist      HISTORY:    71 year old gentleman with cardiac amyloidosis with worsening renal failure, now with uremia, admitted yesterday with symptoms of worsening renal failure with nausea, emesis and poor functioning.  The patient was initially seen in December in the process of transferring his care to my practice and was tentatively to start combination thalidomide and dexamethasone.  He was initially diagnosed and treated at the [facility] then subsequently transferred his care to [facility].He has been seen by nephrology who is considering hemodialysis.  Continues on diuretics, Bumex.  Today the BUN is 119 creatinine 3.8.  The white count 3.2 hemoglobin 11.9 platelets 133       PHYSICAL EXAM:     Vitals:    07/13/16 1105   BP:    Pulse:    Resp:    Temp:    SpO2: 91%       Intake and Output Summary (Last 24 hours) at Date Time    Intake/Output Summary (Last 24 hours) at 07/13/16 1334  Last data filed at 07/13/16 0900   Gross per 24 hour   Intake              470 ml   Output             1005 ml   Net             -535 ml     Appearance: NAD, chronically ill  Skin: no rash  HEENT: pharynx clear  Lungs: CTAP  Heart: RRR,S1S2  Abdomen: flat, soft, nontender, without masses  Extremities: + LE edema  Neuro: Alert, appropriate    ASSESSMENT/PLAN:   1.  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  Nephrology is considering hemodialysis.  Palliative medicine service points out that on continuous milrinone outpatient hemodialysis can be a significant challenge.  The patient was to start treatment with combination with thalidomide and dexamethasone and these medications are presently being ordered for the patient.  Overall prognosis appears  to be guarded.[name], MD  [facility]  [phone]    MEDS:   Scheduled Meds:  Current Facility-Administered Medications   Medication Dose Route Frequency   . aspirin EC  81 mg Oral Daily   . bumetanide  6 mg Oral Q12H   . chlorothiazide  500 mg Intravenous Once   . heparin (porcine)  5,000 Units Subcutaneous Q12H SCH   . midodrine  10 mg Oral TID MEALS   . potassium chloride  20 mEq Oral Daily   . zolpidem  5 mg Oral QHS     Continuous Infusions:  . milrinone 0.375 mcg/kg/min (07/13/16 0353)     PRN Meds:.acetaminophen     Results     Procedure Component Value Units Date/Time    Basic Metabolic Panel [[id number]]  (Abnormal) Collected:  07/13/16 0339    Specimen:  Blood Updated:  07/13/16 0448     Glucose 98 mg/dL      BUN 119.0 (H) mg/dL      Creatinine 3.8 (H) mg/dL      Calcium 10.0 mg/dL  Sodium 132 (L) mEq/L      Potassium 3.4 (L) mEq/L      Chloride 90 (L) mEq/L      CO2 27 mEq/L     Magnesium [[id number]] Collected:  07/13/16 0339    Specimen:  Blood Updated:  07/13/16 0448     Magnesium 1.8 mg/dL     GFR [[id number]] Collected:  07/13/16 0339     Updated:  07/13/16 0448     EGFR 19.2    CBC [[id number]]  (Abnormal) Collected:  07/13/16 0339    Specimen:  Blood from Blood Updated:  07/13/16 0416     WBC 3.27 (L) x10 3/uL      Hgb 11.9 (L) g/dL      Hematocrit 35.9 (L) %      Platelets 133 (L) x10 3/uL      RBC 3.69 (L) x10 6/uL      MCV 97.3 fL      MCH 32.2 (H) pg      MCHC 33.1 g/dL      RDW 13 %      MPV 10.8 fL      Nucleated RBC 0.0 /100 WBC      Absolute NRBC 0.00 x10 3/uL     Hepatic function panel (LFT) [[id number]]  (Abnormal) Collected:  07/12/16 1301    Specimen:  Blood Updated:  07/12/16 1348     Bilirubin, Total 4.1 (H) mg/dL      Bilirubin, Direct 1.7 (H) mg/dL      Bilirubin, Indirect 2.4 (H) mg/dL      AST (SGOT) 42 (H) U/L      ALT 18 U/L      Alkaline Phosphatase 102 U/L      Protein, Total 6.9 g/dL      Albumin 3.8 g/dL      Globulin 3.1 g/dL       Albumin/Globulin Ratio 1.2

## 2016-07-13 NOTE — Progress Notes (Signed)
1102- SBP 89/58, notified Medicine Resident, Tsapos,  Held Diuril per order. Will continue with POC and monitor for any change in patient status Q 1 hour rounding.

## 2016-07-13 NOTE — Consults (Signed)
[facility] Palliative Medicine & Comprehensive Care Consultation   Service Spectralink: [phone] Mon-Fri 9a-4p  [name] Pager: #[phone]    24/7    Date Time: 07/13/16 12:50 PM  Patient Name: Gregory [name]  Attending physician:  Lesly Rubenstein, MD   Primary Care Physician: Zoe Lan, MD  Consulting Physician: Candy Sledge, MD  Consulting Service: Palliative Medicine  Consulted request from Lesly Rubenstein, MD  to see patient regarding: Goals of care and debility     Assessment & Plan    Impression:  71 yo man with ESCM (AL amyloid) on palliative IV Milrinone, with progression of renal failure, admitted with uremia.    Recommendations:    Patient understands severity of illness, and is getting himself "ready to go." He feels he has lived a good life, and recognizes the need to start life closure activities such as deciding what to do with his motorcycle, which is a big step for him to acknowledge he will not be able to ride it. He is also thinking a lot about his funeral, and writing his own obituary. This information was shared freely with little prompting. His largest priority is to have fun and be around people and not just to live life for life's sake.    We discussed his course and the input from the team. He understands that Dr. Wyatt Haste is planning HD trial on Monday, but that he may not tolerate it. We discussed it is possible that his BP would drop or he could feel worse, or that he could respond and feel better. Also discussed that he can not get this therapy outside the hospital while he requires milrinone, so the goal of HD is to try and help him get off milrinone, but we are not yet certain if that is a possibility. We discussed that if he continues to require Milrinone, long term HD would not be an option and that would mean we focus on his comfort but that he would die. He expressed understanding. He is not bothered by difficult discussions about life and death.      We also discussed his preferences for medical decision maker and I shared if there were no documents about this that his wife would, by law, be his next of kin and default medical decision maker. If he wants a different individual, he would need to complete an advance directive. He wants to think more about this, and is not sure. We also discussed cardiac arrest and respiratory failure, life supportive measures and transfer to the ICU/vasopressors. He is clear that he would not want aggressive life support measures and stated "when it is time let me go, no one need to go through all that." Explained that I would place a DNR order in the chart that would not change current interventions or HD plans but would only come into effective is he had no pulse, breathing was failing or BP was dropping and not responding to supportive interventions. He agrees and stated that he would also tell his family wife and 2 children this is what he wants and wants them to know that. I offered to contact them for any questions, but he said that he would.    Psychosocial - will follow up on wishes for MPOA, will also offer to speak with family again.  Spiritual will ask chapain to see Monday.  Goals of care: No CPR allow natural death continue supportive treatments.    Counseling & Coordination of care  with patient greater than 50% including discussed CPR status, advance diretcioves current treatment options and symptoms.  Total Time =__75 minutes__      Candy Sledge  Palliative Medicine   Spectra: [phone] M-F 9a-4:30p  Extend Pager: [phone]   24/7         History of Presenting Illness   This is a 71 y.o. male admitted 07/11/2016 with history of NICM related to AL amyloidosis on chronic IV milrinone, and underlying renal insufficiency, admitted with renal failure, uremia. He is being followed by Dr. Reynold Bowen of advanced cardiology, Dr. Wyatt Haste of Nephrology and Dr. Joneen Caraway of Oncology.    As on outpatient there have been plans to  start palliative treatment for his amyloidosis decadron and thalidomide) both oral and reasonably well tolerated, with the understanding that improvement from amyloid is not expected but that ideally may prevent hastened progression. Now patient admitted with complex situation, requiring what is expected to be permanent HD, though can not be a HD candidate outside the hospital if requires IV Milrinone. Per nephrology, only considering HD b/c of plan for chemotherapy. HD would be as trial to enable patient to be liberated from IV Milrinone. Cardiology does not feel the HD is likely achieve this.     Patient aware of severity of illness and initiated discussion about obituary / funeral plans and family dynamics.    Patient mostly complains of feeling not fully himself, having some minor memory issues, though carried very high level, in depth conversation reflecting on his past and understanding of his limited future. He denies pain, nausea, dyspnea, cough or reduced appetitie (though does acknowledge weight loss)/      Review of Systems   General: Fatigue, decreased energy, no Headache  Eyes: no visual changes  Mouth: positive Xerostomia,no oral pain no dysphagia   Pulm: no cough, no dyspnea, no pleurtic pain  Cards: no chest pain or palpitations  GI: denies anorexia or early satiety, no reflux, denies constipation and diarrhea  no abdominal pain, + weight loss  GU: no hematuria, dysuria, decreased UO  Skin: no rash, no puritis  MS: no new muscle weakness, joint stiffness  Neuro: no focal weakness, numbness, tingling, no dizziness/vertigo   Psyche: Mood is good no history of Anxiety / depression    Past Medical and Surgical History     Past Medical History:   Diagnosis Date   . Arthritis     bilat shoulders, lt knee   . Asthma without status asthmaticus     childhood   . Cardiac amyloidosis    . CHF (congestive heart failure)    . Fracture of unspecified bones     1974 lt shoulder   . Gout    . Malignant neoplasm       Past Surgical History:   Procedure Laterality Date   . ARTHROPLASTY, SHOULDER, TOTAL  06/18/2013    Procedure: ARTHROPLASTY, SHOULDER, TOTAL;  Surgeon: Wendi Snipes, MD;  Location: [facility];  Service: Orthopedics;  Laterality: Left;   . COLONOSCOPY     . EXCISION, GANGLION  12/19/2012    Procedure: EXCISION, GANGLION;  Surgeon: Jillyn Hidden, MD;  Location: [facility];  Service: Orthopedics;  Laterality: Right;  injection right and left shoulder   . HERNIA REPAIR      lt inguinal age 19   . INJECTION, MEDICATION  12/19/2012    Procedure: INJECTION, MEDICATION;  Surgeon: Jillyn Hidden, MD;  Location: [facility];  Service:  Orthopedics;  Laterality: Bilateral;       Social History     Marital Status: married 1 son and 1 daughter, reports somewht strained relationship with wife.   Lives with: wife  Spirituality and Importance: Christian  Meaning and Purpose: to have fun and be around people.     Advanced Directives:  Has: _0  Yes _1  No  Discussed: _2  Yes _3  No  Plan to discuss: _4  Yes _5  No      Emergency contact Listed: Extended Emergency Contact Information  Primary Emergency Contact: Zuba,[name]  Address: [address]           [place], [place] [address] Johnnette Litter of [place] Phone: [phone]  Mobile Phone: [phone]  Relation: Spouse  Secondary Emergency Contact: Talmadge Chad [place] of [place]  Mobile Phone: [phone]  Relation: Daughter     CURRENT CPR Status: FULL  History   Smoking Status   . Never Smoker   Smokeless Tobacco   . Never Used     History   Alcohol Use   . Yes     Comment: rare     History   Drug Use No     Family History     Family History   Problem Relation Age of Onset   . Family history unknown: Yes       Medications     Medications:     Current Facility-Administered Medications   Medication Dose Route Frequency   . aspirin EC  81 mg Oral Daily   . bumetanide  6 mg Oral Q12H   . chlorothiazide  500 mg Intravenous Once   . heparin (porcine)   5,000 Units Subcutaneous Q12H SCH   . midodrine  10 mg Oral TID MEALS   . potassium chloride  20 mEq Oral Daily   . zolpidem  5 mg Oral QHS       acetaminophen      Allergies     No Known Allergies    Physical Exam     BP (!) 89/58   Pulse (!) 102   Temp 97.5 F (36.4 C) (Oral)   Resp 20   Ht 1.676 m (_6 )   Wt 56.8 kg (125 lb 4.8 oz)   SpO2 91%   BMI 20.22 kg/m     Physical Exam:  General: sitting up in recliner by window, sleepy but conversant no acute distress  EYES:  eomi, sclera anicteric  ENT: oropharynx clear without lesions or thrush, mucous membranes moist  CV: tachy regular  Lungs: CTAB, breathing comfortably, no accessory muscle use, no wheeze, rhonchi  Abd: soft, NT, ND, +bs, no rebound or guarding  Ext: no clubbing, cyanosis, ++ bilat LE edema  Neuro: awake, oriented x 3, no myoclonus   Skin: dry skin no rashes       Labs / Radiology     Lab and diagnostics: reviewed in Epic.    Recent Labs  Lab 07/13/16  0339   WBC 3.27*   Hgb 11.9*   Hematocrit 35.9*   Platelets 133*         Recent Labs  Lab 07/13/16  0339   Sodium 132*   Potassium 3.4*   Chloride 90*   CO2 27   BUN 119.0*   Creatinine 3.8*   EGFR 19.2   Glucose 98   Calcium 10.0         Recent Labs  Lab 07/12/16  1301   Bilirubin, Total 4.1*  Bilirubin, Direct 1.7*   Protein, Total 6.9   Albumin 3.8   ALT 18   AST (SGOT) 42*       Xr Chest  Ap Portable    Result Date: 07/11/2016    Negative for acute process [name], MD 07/11/2016 8:37 PM

## 2016-07-13 NOTE — Progress Notes (Addendum)
Advanced Heart Failure and Transplant Consult Note    Heart Failure/Transplant Spectra Link [phone]    Listed for transplant:  No.     Assessment:    71 year old man with AL amyloidosis and nonischemic cardiomyopathy.     Non-ischemic cardiomyopathy, EF 20%, NYHA Class 4, ACC/AHA Stage D due to AL amyloidosis   Acute on chronic systolic heart failure, started on milrinone 0.375 mcg/kg/min 04/2016.   AL Amylodosis, responded to CyBorD (January - August 2016) though relapsed (increased serum free light chains from 35 in October 2017 to 378 in October 2017) and was started on revlimid 5 mg QOD on 04/09/16.   Acute on chronic kidney disease, stage 4, with uremia   Asthma   Osteoarthritis   Hyponatremia, likely hypovolemic   Anorexia   Nausea and vomiting    Recommendations:     Palliative care consultation, recommend hospice given his debilitated state and overall discomfort - his prognosis is extremely poor   Continue bumetanide 6 mg po BID   May need gentle potassium repletion to maintain K>4 to decrease arrhythmic risk in spite of renal dysfunction   Will give chorothiazide 500 mg IV to potentiate diuresis after discussion with nephrology, will attempt to use diuretics over the weekend and then nephrology will consider dialysis on Monday (noting that there is no dialysis center that will provide outpatient dialysis on milrinone, and that the likelihood of him being able to survive off milrinone is <1% even after substantial dialysis and improvement of uremia).    Continue milrinone 0.375 mcg/kg/min based on his ideal weight, would not recalibrate to current weight in order to continue the same dosing unless he becomes hypotensive   Continue midodrine 10 mg po TID   Reasonable to hold spironolactone 50 mg po daily   He does not have an implantable cardiac device   Agree with nephrology consultation wih Dr Wyatt Haste   Oncology consultation with Dr Joneen Caraway to see the patient in the hospital, with  thalidomide and decadron - which would not be expected to facilitate any rapid cardiac improvement   Monitor I/O, daily weights   Recommend re-evaluation of goals of care with respect to DNR/DNI    Greater than 50% of this encounter and greater than 35 minutes were spent face to face with the patient counseling regarding, but not limited to explaining the current diagnoses, educating the patient on proper behavioral management of heart failure including compliance, and discussing prognosis.    ---------------------------------------------------------------------------------------------------------  Interval Events: Improved creatinine with diuresis, received chlorothiazide x1    Subjective:  He may feel marginally improved today, ongoing fatigue/exhaustion, drowsiness.    Patient Active Problem List   Diagnosis   . Ganglion cyst of wrist, right   . Primary localized osteoarthrosis, shoulder region   . Asthma without status asthmaticus   . Acute on chronic combined systolic and diastolic heart failure   . Elevated troponin   . Bilateral lower extremity edema   . Light chain (AL) amyloidosis   . Hyperkalemia   . Chronic renal failure, stage 3 (moderate)   . Cardiac amyloidosis   . Chronic systolic heart failure   . NICM (nonischemic cardiomyopathy)   . Uremia   . Palliative care by specialist     Current Meds:    aspirin EC 81 mg Oral Daily   bumetanide 6 mg Oral Q12H   heparin (porcine) 5,000 Units Subcutaneous Q12H SCH   midodrine 10 mg Oral TID MEALS   potassium chloride  20 mEq Oral Daily   zolpidem 5 mg Oral QHS     Drips:  . milrinone 0.375 mcg/kg/min (07/13/16 0353)     He has No Known Allergies.     Family history: Family history is unknown by patient.    Social history:   Social History     Social History   . Marital status: Married     Spouse name: N/A   . Number of children: N/A   . Years of education: N/A     Occupational History   . Not on file.     Social History Main Topics   . Smoking status: Never  Smoker   . Smokeless tobacco: Never Used   . Alcohol use Yes      Comment: rare   . Drug use: No   . Sexual activity: No     Other Topics Concern   . Not on file     Social History Narrative   . No narrative on file     Objective:    Vital signs in last 24 hours:   Temp:  [97.3 F (36.3 C)-97.7 F (36.5 C)] 97.3 F (36.3 C)  Heart Rate:  [97-106] 97  Resp Rate:  [12-20] 20  BP: (82-99)/(52-68) 93/59  SpO2: 98 %O2 Device: None (Room air)  Height: 167.6 cm (_0 )  Weight: 56.8 kg (125 lb 4.8 oz); Weight change: 0.59 kg (1 lb 4.8 oz)  Body mass index is 20.22 kg/m.[name]    Telemetry: no significant events    I/O last 3 completed shifts:  In: 990 [P.O.:990]  Out: 7106 [Urine:1145]                     Physical Exam:   General: cachectic, crying, weak appearing, not distressed  HEENT:  Anicteric sclera, JVP >10 cmH2O, no carotid bruits. Carotid pulses are diminished bilaterally  Lungs: Bilateral decreased breath sounds and crackles with decreased effort  Cardiovascular: soft s1 s2, tachycardic rate, regular rhythm, no m/r/g, PMI laterally displaced  Abdominal: thin, soft, NT/ND, +B.S., no HSM.   Extremities: cool to touch, no cyanosis, 1+ bilateral edema, peripheral pulses are decreased bilaterally.  Neuromuscular exam: grossly non-focal, though not formally tested.    Cardiac Studies:   Echocardiogramon 11/18/17shows a left ventricular end-diastolic dimension of 26RS, concentric LVH (walls 15 mm), global HKwith a left ventricular ejection fraction of 20-25%, and grade 3diastolic dysfunction. The right ventricle is dilated with reduced systolic function. The left atrium isdilated and the right atrium isdilated. The aortic valve is trileaflet withoutevidence of stenosis; there is trivialinsufficiency. The mitral valve is morphologically normal with mildregurgitation. There is mildtricuspid regurgitation. Pulmonary systolic pressure is estimated to be 36mHg above right atrial pressure (10-111mg).  There is trivialpulmonic insufficiency. A pericardial effusion is notpresent.    ECG Findingson 05/03/16: sinus rhythm, left anterior fascicular block and inferior MI, low voltage. As compared to hisprevious ECG on 11/1/17there is nono significant change.     Right heart catheterizationon 05/07/16 (prior to milrinone)showed a RA pressure of 8 mmHg, RV pressure of 24/8 mmHg, PA pressure of 29/9/16 mmHg, and a PW pressure of 13 mmHg. PA saturation was 45.8%. Cardiac output by Fick calculation was 1.9 L/min with a corresponding index of 1.2 L/min/m2. Cardiac output by thermodilution was 1.6 L/min with a corresponding index of 0.95 L/min/m2. PVR is 1.6 WU.    CXR 07/11/16: Independently interpreted  No acute process    Lab Results   Component Value  Date    WBC 3.27 (L) 07/13/2016    HGB 11.9 (L) 07/13/2016    PLT 133 (L) 07/13/2016    NA 132 (L) 07/13/2016    K 3.4 (L) 07/13/2016    BUN 119.0 (H) 07/13/2016    CREAT 3.8 (H) 07/13/2016    EGFR 19.2 07/13/2016    MG 1.8 07/13/2016    AST 42 (H) 07/12/2016    ALB 3.8 07/12/2016    INR 1.3 (H) 04/17/2016    BNP 4,994 (H) 06/05/2016

## 2016-07-13 NOTE — Plan of Care (Signed)
Problem: Moderate/High Fall Risk Score >5  Goal: Patient will remain free of falls   07/13/16 1541   OTHER   Moderate Risk (6-13) MOD-Consider activation of bed alarm if appropriate;MOD-Apply bed exit alarm if patient is confused;MOD-Floor mat at bedside (where available) if appropriate;MOD-Use of chair-pad alarm when appropriate;MOD-Remain with patient during toileting;MOD-Place Fall Risk level on whiteboard in room       Problem: Safety  Goal: Patient will be free from injury during hospitalization  Outcome: Progressing   07/13/16 1541   Goal/Interventions addressed this shift   Patient will be free from injury during hospitalization  Assess patient's risk for falls and implement fall prevention plan of care per policy;Provide and maintain safe environment;Use appropriate transfer methods;Include patient/ family/ care giver in decisions related to safety;Hourly rounding     Goal: Patient will be free from infection during hospitalization  Outcome: Progressing   07/13/16 1541   Goal/Interventions addressed this shift   Free from Infection during hospitalization Assess and monitor for signs and symptoms of infection;Monitor lab/diagnostic results;Monitor all insertion sites (i.e. indwelling lines, tubes, urinary catheters, and drains);Encourage patient and family to use good hand hygiene technique       Problem: Pain  Goal: Pain at adequate level as identified by patient  Outcome: Progressing   07/13/16 1541   Goal/Interventions addressed this shift   Pain at adequate level as identified by patient Identify patient comfort function goal;Assess pain on admission, during daily assessment and/or before any "as needed" intervention(s);Reassess pain within 30-60 minutes of any procedure/intervention, per Pain Assessment, Intervention, Reassessment (AIR) Cycle;Evaluate if patient comfort function goal is met;Evaluate patient's satisfaction with pain management progress;Offer non-pharmacological pain management  interventions;Include patient/patient care companion in decisions related to pain management as needed       Comments: Patient is alert and oriented, but has episodes of confusion and forgetfulness, but reorients easily. Daughter present at bedside at this time. Telemetry SR -ST. On RA, no SO noted, Bumex and diuril held this am secondary to low BP, MD aware. Midodrine administered as ordered with meals. Patient is on 1500 cc Fluid Restriction, sodium level 132 this am. Uses Depends, voiding small amounts of urine using urinal. Primacor gtt. Continues at 0.375 mcg/kg/min. (6.9 cc / hr.). Safety precautions in place: call bell within reach, bed in low position, fall mat in place, bed and chair alarm on, and hourly rounding in progress. Will Continue with POC and monitor for any change in patient status Q 1 hour rounding.

## 2016-07-13 NOTE — Progress Notes (Signed)
MEDICINE PROGRESS NOTE    Date Time: 07/13/16 6:07 AM  Patient Name: Gregory [name] T  Attending Physician: Lesly Rubenstein, MD    Assessment:   Active Problems:    Uremia    Palliative care by specialist  Resolved Problems:    * No resolved hospital problems. *    Gregory [name] is a 71 y.o. male with PMH of NICM (EF 20-25%) secondary to AL amyloidosis (on milrinone), CKD, HTN and asthma who presents with AKI, currently in the process of deciding whether he wants to proceed with temporary dialysis/ therapy for amyloidosis. Palliative care also currently involved.     Plan:   #AKI on CKD - Progression of Amyloidosis vs. Cardio-renal syndrome. Creatinine fluctuating, 3.8 today, Creatinine Clearance: 14.9 mL/min. Nephrology currently considering dialysis though his BPs may not tolerate. Patient aware that if HD were to be done, it would be a temporizing measure with the goal of getting him off milrinone, though this may not be possible  - Hold spirolactone  - Continue Bumex 6 mg q 12h  - F/u Nephrology consult Dr. Wyatt Haste  - Appreciate palliative care recs    # H/o NICM HFrEF w/ EF 20-25% on last ECHO 11/19, secondary to AL amyloid, Followed by Dr. Lu Duffel, whom he saw last week (metolazone discontinued). S/p admission on 12/20-12/28 for volume overload and treated with Bumex gtt. Lactate wnl, Bilirubin elevated likely 2/2 HF  - F/u Advanced HF, apprec recs  - Daily standing weight  - Strict I/Os  - Fluid restrict to 1.5 L/day and Na 2 g/day  - Continue home Bumex 6 mg q 12 h  - Continue continuous home milrinone infusion 0.375 mcg/kg/min    #H/o AL cardiac amyloidosis - Followed by locally by Dr. Joneen Caraway ([facility]). Previously treated with 10 cycles CyBorD completed in August, In October he received a 4 week course of Revlimid 5 mg daily with dexamethasone, complicated by fluid retention  - F/u Oncology recs (Dr. Saunders Glance to see)    # Vomiting, resolved  - Continue to monitor    # Hyponatremia, may be 2/2 renal failure  -  Monitor for now    # Hypotension  - Continue home midodrine TID    #Global  F -   Current Facility-Administered Medications   Medication   . milrinone     E - replete PRN  N - Diet cardiac Na restriction, if any: 2 GM NA; Fluid restriction: 1500 ML FLUID  DVT ppx - Heparin subq q12  GI ppx - not indicated  PT/OT - ordered  CODE - Full Code      Safety Checklist:     DVT prophylaxis:  CHEST guideline (See page e199S) Chemical   Foley:  [place] Rn Foley protocol Not present   IVs:  Peripheral IV   PT/OT: Ordered   Daily CBC & or Chem ordered:  SHM/ABIM guidelines (see #5) Yes, due to clinical and lab instability   Reference for approximate charges of common labs: CBC auto diff - $76  BMP - $99  Mg - $79    Lines:     Patient Lines/Drains/Airways Status    Active PICC Line / CVC Line / PIV Line / Drain / Airway / Intraosseous Line / Epidural Line / ART Line / Line / Wound / Pressure Ulcer / NG/OG Tube     Name:   Placement date:   Placement time:   Site:   Days:    PICC Single Lumen Right  Other (Comment)          Other (Comment)        Peripheral IV 07/11/16 Right Forearm  07/11/16    2002    Forearm    less than 1                 Disposition: (Please see PAF column for Expected D/C Date)   Today's date: 07/13/2016  Admit Date: 07/11/2016  8:41 PM  LOS: 2    Subjective       HPI/Subjective: Patient states that he still does not feel quite himself. He denies any pain at this time. He denies any nausea, vomiting, abdominal pain, chest pain, SOB. He states he is urinating normally.       Physical Exam:     VITAL SIGNS PHYSICAL EXAM   Temp:  [97.3 F (36.3 C)-98 F (36.7 C)] 97.3 F (36.3 C)  Heart Rate:  [97-106] 97  Resp Rate:  [12-20] 20  BP: (82-103)/(52-74) 91/65        Intake/Output Summary (Last 24 hours) at 07/13/16 0102  Last data filed at 07/12/16 2300   Gross per 24 hour   Intake              840 ml   Output              845 ml   Net               -5 ml    Physical Exam  General: awake, alert X 3, in  NAD  HEENT: JVD present  Cardiovascular: regular rate and rhythm, no murmurs, rubs or gallops  Lungs: clear to auscultation bilaterally, without wheezing, rhonchi, or rales  Abdomen: soft, non-tender, non-distended; no palpable masses,  normoactive bowel sounds  Extremities: 2+ pitting edema in bilateral LE, warm and well perfused       Meds:     Medications were reviewed:  Current Facility-Administered Medications   Medication Dose Route Frequency   . aspirin EC  81 mg Oral Daily   . bumetanide  6 mg Oral Q12H   . heparin (porcine)  5,000 Units Subcutaneous Q12H SCH   . midodrine  10 mg Oral TID MEALS   . potassium chloride  20 mEq Oral Daily   . zolpidem  5 mg Oral QHS       Labs:     Labs (last 72 hours):      Recent Labs  Lab 07/13/16  0339 07/12/16  0509   WBC 3.27* 3.52   Hgb 11.9* 12.4*   Hematocrit 35.9* 37.5*   Platelets 133* 126*            Recent Labs  Lab 07/13/16  0339 07/12/16  1301 07/12/16  0509   Sodium 132*  --  131*   Potassium 3.4*  --  4.0   Chloride 90*  --  89*   CO2 27  --  26   BUN 119.0*  --  123.0*   Creatinine 3.8*  --  4.3*   Calcium 10.0  --  10.2   Albumin  --  3.8  --    Protein, Total  --  6.9  --    Bilirubin, Total  --  4.1*  --    Alkaline Phosphatase  --  102  --    ALT  --  18  --    AST (SGOT)  --  42*  --    Glucose 98  --  32*                     Microbiology Results     None            Imaging, reviewed and are significant for:  Xr Chest  Ap Portable    Result Date: 07/11/2016    Negative for acute process [name], MD 07/11/2016 8:37 PM    Discussed with Dr. Haig Prophet    Signed by: Carmine Savoy, DO PGY1

## 2016-07-14 LAB — BASIC METABOLIC PANEL
BUN: 122 mg/dL — ABNORMAL HIGH (ref 9.0–28.0)
CO2: 26 mEq/L (ref 22–29)
Calcium: 10.1 mg/dL (ref 8.5–10.5)
Chloride: 89 mEq/L — ABNORMAL LOW (ref 100–111)
Creatinine: 3.6 mg/dL — ABNORMAL HIGH (ref 0.7–1.3)
Glucose: 101 mg/dL — ABNORMAL HIGH (ref 70–100)
Potassium: 3.4 mEq/L — ABNORMAL LOW (ref 3.5–5.1)
Sodium: 131 mEq/L — ABNORMAL LOW (ref 136–145)

## 2016-07-14 LAB — CBC
Absolute NRBC: 0 10*3/uL
Hematocrit: 37.1 % — ABNORMAL LOW (ref 42.0–52.0)
Hgb: 12.4 g/dL — ABNORMAL LOW (ref 13.0–17.0)
MCH: 32.7 pg — ABNORMAL HIGH (ref 28.0–32.0)
MCHC: 33.4 g/dL (ref 32.0–36.0)
MCV: 97.9 fL (ref 80.0–100.0)
MPV: 11.5 fL (ref 9.4–12.3)
Nucleated RBC: 0 /100 WBC (ref 0.0–1.0)
Platelets: 119 10*3/uL — ABNORMAL LOW (ref 140–400)
RBC: 3.79 10*6/uL — ABNORMAL LOW (ref 4.70–6.00)
RDW: 13 % (ref 12–15)
WBC: 4.08 10*3/uL (ref 3.50–10.80)

## 2016-07-14 LAB — MAGNESIUM: Magnesium: 2.2 mg/dL (ref 1.6–2.6)

## 2016-07-14 LAB — GFR: EGFR: 20.5

## 2016-07-14 MED ORDER — CHLOROTHIAZIDE SODIUM 500 MG IV SOLR
500.0000 mg | Freq: Once | INTRAVENOUS | Status: AC
Start: 2016-07-14 — End: 2016-07-14
  Administered 2016-07-14: 500 mg via INTRAVENOUS
  Filled 2016-07-14: qty 500

## 2016-07-14 NOTE — Progress Notes (Signed)
HEMATOLOGY ONCOLOGY PROGRESS NOTE  [facility] [place]   [phone]        Date Time: 07/14/16 1:37 PM  Patient Name: Gregory [name], Gregory [name]    Active Problems:    Uremia    Palliative care by specialist      HISTORY:    71 year old gentleman with cardiac amyloidosis with worsening renal failure, now with uremia, admitted yesterday with symptoms of worsening renal failure with nausea, emesis and poor functioning.  The patient was initially seen in December in the process of transferring his care to my practice and was tentatively to start combination thalidomide and dexamethasone.  He was initially diagnosed and treated at the [facility] then subsequently transferred his care to [facility].He has been seen by nephrology who is considering hemodialysis.  Continues on diuretics, Bumex.  The BUN is 122 creatinine 3.6 white count 4.0 hemoglobin 12.4 platelets 119.  He continues to display lethargy and confusion.       PHYSICAL EXAM:     Vitals:    07/14/16 1102   BP: (!) 88/55   Pulse: 100   Resp: 16   Temp: 97.8 F (36.6 C)   SpO2: 92%       Intake and Output Summary (Last 24 hours) at Date Time    Intake/Output Summary (Last 24 hours) at 07/14/16 1337  Last data filed at 07/14/16 1010   Gross per 24 hour   Intake              725 ml   Output             1150 ml   Net             -425 ml     Appearance: chronically ill  Skin: no rash  HEENT: pharynx clear  Lungs: clear  Heart: distant S1S2  Abdomen: flat, soft, nontender, without masses  Extremities:2+ edema  Neuro: lethargic, communicative, slow to answer questions    ASSESSMENT/PLAN:   1.  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  Nephrology is considering hemodialysis.  Palliative medicine service points out that on continuous milrinone outpatient hemodialysis can be a significant challenge.  The patient was to start treatment with combination with thalidomide and  dexamethasone and these medications are presently being ordered for the patient.  Overall prognosis appears to be guarded.[name], MD  [facility]  [phone]    MEDS:   Scheduled Meds:  Current Facility-Administered Medications   Medication Dose Route Frequency   . aspirin EC  81 mg Oral Daily   . bumetanide  6 mg Oral Q12H   . chlorothiazide  500 mg Intravenous Once   . heparin (porcine)  5,000 Units Subcutaneous Q12H SCH   . midodrine  10 mg Oral TID MEALS   . potassium chloride  20 mEq Oral Daily   . zolpidem  5 mg Oral QHS     Continuous Infusions:  . milrinone 0.375 mcg/kg/min (07/14/16 0837)     PRN Meds:.acetaminophen     Results     Procedure Component Value Units Date/Time    Magnesium [[id number]] Collected:  07/14/16 0503    Specimen:  Blood Updated:  07/14/16 0631     Magnesium 2.2 mg/dL     GFR [[id number]] Collected:  07/14/16 0503     Updated:  07/14/16 0631     EGFR 20.5  Basic Metabolic Panel [[id number]]  (Abnormal) Collected:  07/14/16 0503    Specimen:  Blood Updated:  07/14/16 0631     Glucose 101 (H) mg/dL      BUN 122.0 (H) mg/dL      Creatinine 3.6 (H) mg/dL      Calcium 10.1 mg/dL      Sodium 131 (L) mEq/L      Potassium 3.4 (L) mEq/L      Chloride 89 (L) mEq/L      CO2 26 mEq/L     CBC [[id number]]  (Abnormal) Collected:  07/14/16 0503    Specimen:  Blood from Blood Updated:  07/14/16 0615     WBC 4.08 x10 3/uL      Hgb 12.4 (L) g/dL      Hematocrit 37.1 (L) %      Platelets 119 (L) x10 3/uL      RBC 3.79 (L) x10 6/uL      MCV 97.9 fL      MCH 32.7 (H) pg      MCHC 33.4 g/dL      RDW 13 %      MPV 11.5 fL      Nucleated RBC 0.0 /100 WBC      Absolute NRBC 0.00 x10 3/uL

## 2016-07-14 NOTE — Progress Notes (Signed)
Advanced Heart Failure and Transplant Consult Note    Heart Failure/Transplant Spectra Link [id number]    Listed for transplant:  No.     Assessment:    71 year old man with AL amyloidosis and nonischemic cardiomyopathy.     Non-ischemic cardiomyopathy, EF 20%, NYHA Class 4, ACC/AHA Stage D due to AL amyloidosis   Acute on chronic systolic heart failure, started on milrinone 0.375 mcg/kg/min 04/2016.   AL Amylodosis, responded to CyBorD (January - August 2016) though relapsed (increased serum free light chains from 35 in October 2017 to 378 in October 2017) and was started on revlimid 5 mg QOD on 04/09/16.   Acute on chronic kidney disease, stage 4, with uremia; likely myeloma kidney   Asthma   Osteoarthritis   Hyponatremia, likely hypervolemic   Anorexia   Nausea and vomiting    Recommendations:     Palliative care consultation, recommend hospice given his debilitated state and overall discomfort - his prognosis is extremely poor   Continue bumetanide 6 mg po BID   May need gentle potassium repletion to maintain K>4 to decrease arrhythmic risk in spite of renal dysfunction   Will give chorothiazide 500 mg IV to potentiate diuresis after discussion with nephrology, attempting to use diuretics over the weekend and then nephrology will consider dialysis on Monday (noting that there is no dialysis center that will provide outpatient dialysis on milrinone, and that the likelihood of him being able to survive off milrinone is <1% even after substantial dialysis and improvement of uremia).    Continue milrinone 0.375 mcg/kg/min based on his ideal weight, would not recalibrate to current weight in order to continue the same dosing unless he becomes hypotensive   Continue midodrine 10 mg po TID   Reasonable to hold spironolactone 50 mg po daily   He does not have an implantable cardiac device   Agree with nephrology consultation wih Dr Wyatt Haste; still considering trial of hemodialysis   Oncology consultation  with Dr Joneen Caraway to see the patient in the hospital, with thalidomide and decadron - which would not be expected to facilitate any rapid cardiac improvement - being started 07/13/16   Monitor I/O, daily weights   As of 07/13/16: DNR/DNI    Greater than 50% of this encounter and greater than 35 minutes were spent face to face with the patient and family counseling regarding, but not limited to explaining the current diagnoses, educating the patient on proper behavioral management of heart failure including compliance, and discussing prognosis.    ---------------------------------------------------------------------------------------------------------  Interval Events: Improved creatinine with diuresis, received chlorothiazide x1; seen by Dr Joneen Caraway: starting thalidomide and dexamethasone as an inpatient; transitioned appropriately to DNR/DNI on 07/13/16    Subjective:  He may feel marginally worse today, ongoing fatigue/exhaustion, drowsiness and dyspnea.    Patient Active Problem List   Diagnosis   . Ganglion cyst of wrist, right   . Primary localized osteoarthrosis, shoulder region   . Asthma without status asthmaticus   . Acute on chronic combined systolic and diastolic heart failure   . Elevated troponin   . Bilateral lower extremity edema   . Light chain (AL) amyloidosis   . Hyperkalemia   . Chronic renal failure, stage 3 (moderate)   . Cardiac amyloidosis   . Chronic systolic heart failure   . NICM (nonischemic cardiomyopathy)   . Uremia   . Palliative care by specialist     Current Meds:    aspirin EC 81 mg Oral Daily  bumetanide 6 mg Oral Q12H   chlorothiazide 500 mg Intravenous Once   heparin (porcine) 5,000 Units Subcutaneous Q12H SCH   midodrine 10 mg Oral TID MEALS   potassium chloride 20 mEq Oral Daily   zolpidem 5 mg Oral QHS     Drips:  . milrinone 0.375 mcg/kg/min (07/14/16 0837)     He has No Known Allergies.     Family history: Family history is unknown by patient.    Social history:   Social History      Social History   . Marital status: Married     Spouse name: N/A   . Number of children: N/A   . Years of education: N/A     Occupational History   . Not on file.     Social History Main Topics   . Smoking status: Never Smoker   . Smokeless tobacco: Never Used   . Alcohol use Yes      Comment: rare   . Drug use: No   . Sexual activity: No     Other Topics Concern   . Not on file     Social History Narrative   . No narrative on file     Objective:    Vital signs in last 24 hours:   Temp:  [97.3 F (36.3 C)-97.7 F (36.5 C)] 97.3 F (36.3 C)  Heart Rate:  [95-104] 103  Resp Rate:  [18-20] 20  BP: (85-101)/(56-69) 101/69  SpO2: 97 %O2 Device: None (Room air)  Height: 167.6 cm (_0 )  Weight: 57.1 kg (125 lb 14.1 oz); Weight change: 0.264 kg (9.3 oz)  Body mass index is 20.32 kg/m.[name]    Telemetry: no significant events    I/O last 3 completed shifts:  In: 6144 [P.O.:1175]  Out: 1935 [Urine:1935]                     Physical Exam:   General: cachectic, crying, weak appearing, not distressed  HEENT:  Anicteric sclera, JVP >10 cmH2O, no carotid bruits. Carotid pulses are diminished bilaterally  Lungs: Bilateral decreased breath sounds and crackles with decreased effort  Cardiovascular: soft s1 s2, tachycardic rate, regular rhythm, no m/r/g, PMI laterally displaced  Abdominal: thin, soft, NT/ND, +B.S., no HSM.   Extremities: cool to touch, no cyanosis, 1+ bilateral edema, peripheral pulses are decreased bilaterally.  Neuromuscular exam: grossly non-focal, though not formally tested.    Cardiac Studies:   Echocardiogramon 11/18/17shows a left ventricular end-diastolic dimension of 31VQ, concentric LVH (walls 15 mm), global HKwith a left ventricular ejection fraction of 20-25%, and grade 3diastolic dysfunction. The right ventricle is dilated with reduced systolic function. The left atrium isdilated and the right atrium isdilated. The aortic valve is trileaflet withoutevidence of stenosis; there is  trivialinsufficiency. The mitral valve is morphologically normal with mildregurgitation. There is mildtricuspid regurgitation. Pulmonary systolic pressure is estimated to be 59mHg above right atrial pressure (10-166mg). There is trivialpulmonic insufficiency. A pericardial effusion is notpresent.    ECG Findingson 05/03/16: sinus rhythm, left anterior fascicular block and inferior MI, low voltage. As compared to hisprevious ECG on 11/1/17there is nono significant change.     Right heart catheterizationon 05/07/16 (prior to milrinone)showed a RA pressure of 8 mmHg, RV pressure of 24/8 mmHg, PA pressure of 29/9/16 mmHg, and a PW pressure of 13 mmHg. PA saturation was 45.8%. Cardiac output by Fick calculation was 1.9 L/min with a corresponding index of 1.2 L/min/m2. Cardiac output by thermodilution was 1.6 L/min  with a corresponding index of 0.95 L/min/m2. PVR is 1.6 WU.    CXR 07/11/16: Independently interpreted  No acute process    Lab Results   Component Value Date    WBC 4.08 07/14/2016    HGB 12.4 (L) 07/14/2016    PLT 119 (L) 07/14/2016    NA 131 (L) 07/14/2016    K 3.4 (L) 07/14/2016    BUN 122.0 (H) 07/14/2016    CREAT 3.6 (H) 07/14/2016    EGFR 20.5 07/14/2016    MG 2.2 07/14/2016    AST 42 (H) 07/12/2016    ALB 3.8 07/12/2016    INR 1.3 (H) 04/17/2016    BNP 4,994 (H) 06/05/2016

## 2016-07-14 NOTE — Progress Notes (Signed)
MEDICINE PROGRESS NOTE    Date Time: 07/14/16 10:58 AM  Patient Name: Gregory [name]  Attending Physician: Gregory Rubenstein, MD    Assessment:   Active Problems:    Uremia    Palliative care by specialist  Resolved Problems:    * No resolved hospital problems. *    Gregory [name] is a 71 y.o. male with PMH of NICM (EF 20-25%) secondary to AL amyloidosis (on milrinone), CKD, HTN and asthma who presents with AKI, currently in the process of deciding whether he wants to proceed with temporary dialysis/ therapy for amyloidosis. Palliative care also currently involved.     Plan:     #AKI on CKD - Progression of Amyloidosis vs. Cardio-renal syndrome. Creatinine fluctuating, 3.8 today; Unable to be discharged with milrinone and outpatient dialysis  - given above limitation, unlikely that HD will provide any benefit and put him at risk for arhythmias/fluid shift/hypotension  - Patient today leaning towards trying dialysis but still wants to think about it  - Hold spirolactone  - Continue Bumex 6 mg q 12h  - Nephrology (Dr. Wyatt [name])  - Appreciate palliative care recs    # H/o NICM HFrEF w/ EF 20-25% on last ECHO 11/19, secondary to AL amyloid, Followed by Dr. Lu [name], whom he saw last week (metolazone discontinued). S/p admission on 12/20-12/28 for volume overload and treated with Bumex gtt. Lactate wnl, Bilirubin elevated likely 2/2 HF  - No acute changes, not a candidate for advanced therapies  - Continue home Bumex 6 mg q 12 h  - Continue continuous home milrinone infusion 0.375 mcg/kg/min  - Daily standing weight  - Strict I/Os  - Fluid restrict to 1.5 L/day and Na 2 g/day  - F/u Advanced HF, apprec recs    #H/o AL cardiac amyloidosis - Followed by locally by Dr. Joneen [name] ([facility]). Previously treated with 10 cycles CyBorD completed in August, In October he received a 4 week course of Revlimid 5 mg daily with dexamethasone, complicated by fluid retention  - patient wants to start treatment with thalidomide/dexamethasone  -  F/u Oncology recs (Gregory [name] )    # Vomiting, resolved  - Continue to monitor    # Hyponatremia, may be 2/2 renal failure  - Monitor for now    # Hypotension  - Continue home midodrine TID    #Global  F -   Current Facility-Administered Medications   Medication   . milrinone     E - replete PRN  N - Diet cardiac Na restriction, if any: 2 GM NA; Fluid restriction: 1500 ML FLUID  DVT ppx - Heparin subq q12  GI ppx - not indicated  PT/OT - ordered  CODE - NO CPR  -  ALLOW NATURAL DEATH      Safety Checklist:     DVT prophylaxis:  CHEST guideline (See page e199S) Chemical   Foley:  [place] Rn Foley protocol Not present   IVs:  Peripheral IV   PT/OT: Ordered   Daily CBC & or Chem ordered:  SHM/ABIM guidelines (see #5) Yes, due to clinical and lab instability   Reference for approximate charges of common labs: CBC auto diff - $76  BMP - $99  Mg - $79    Lines:     Patient Lines/Drains/Airways Status    Active PICC Line / CVC Line / PIV Line / Drain / Airway / Intraosseous Line / Epidural Line / ART Line / Line / Wound / Pressure Ulcer / NG/OG Tube  Name:   Placement date:   Placement time:   Site:   Days:    PICC Single Lumen Right Other (Comment)          Other (Comment)        Peripheral IV 07/11/16 Right Forearm  07/11/16    2002    Forearm    less than 1                 Disposition: (Please see PAF column for Expected D/C Date)   Today's date: 07/14/2016  Admit Date: 07/11/2016  8:41 PM  LOS: 3    Subjective       HPI/Subjective: NAEO. Has some episodes of mild shortness of breath. Sleeping better with ambien. Denies any chest pain or nausea or vomiting. Thinks edema is unchanged.       Physical Exam:     VITAL SIGNS PHYSICAL EXAM   Temp:  [97.3 F (36.3 C)-97.7 F (36.5 C)] 97.3 F (36.3 C)  Heart Rate:  [95-104] 103  Resp Rate:  [18-20] 20  BP: (85-101)/(56-69) 101/69        Intake/Output Summary (Last 24 hours) at 07/14/16 1058  Last data filed at 07/14/16 1010   Gross per 24 hour   Intake              725  ml   Output             1310 ml   Net             -585 ml    Physical Exam  General: awake, alert X 3, in NAD  HEENT: JVD present to earlobe  Cardiovascular: regular rate and rhythm, no murmurs, rubs or gallops  Lungs: clear to auscultation bilaterally, without wheezing, rhonchi, or rales  Abdomen: soft, non-tender, non-distended; no palpable masses,  normoactive bowel sounds  Extremities: 2+ pitting edema in bilateral LE, warm and well perfused       Meds:     Medications were reviewed:  Current Facility-Administered Medications   Medication Dose Route Frequency   . aspirin EC  81 mg Oral Daily   . bumetanide  6 mg Oral Q12H   . chlorothiazide  500 mg Intravenous Once   . heparin (porcine)  5,000 Units Subcutaneous Q12H SCH   . midodrine  10 mg Oral TID MEALS   . potassium chloride  20 mEq Oral Daily   . zolpidem  5 mg Oral QHS       Labs:     Labs (last 72 hours):      Recent Labs  Lab 07/14/16  0503 07/13/16  0339   WBC 4.08 3.27*   Hgb 12.4* 11.9*   Hematocrit 37.1* 35.9*   Platelets 119* 133*            Recent Labs  Lab 07/14/16  0503 07/13/16  0339 07/12/16  1301   Sodium 131* 132*  --    Potassium 3.4* 3.4*  --    Chloride 89* 90*  --    CO2 26 27  --    BUN 122.0* 119.0*  --    Creatinine 3.6* 3.8*  --    Calcium 10.1 10.0  --    Albumin  --   --  3.8   Protein, Total  --   --  6.9   Bilirubin, Total  --   --  4.1*   Alkaline Phosphatase  --   --  102   ALT  --   --  18   AST (SGOT)  --   --  42*   Glucose 101* 98  --                      Microbiology Results     None            Imaging, reviewed and are significant for:  Xr Chest  Ap Portable    Result Date: 07/11/2016    Negative for acute process [name], MD 07/11/2016 8:37 PM    Discussed with Dr. Haig Prophet    Signed by: Gregory Savoy, DO PGY1

## 2016-07-14 NOTE — Plan of Care (Signed)
Problem: Moderate/High Fall Risk Score >5  Goal: Patient will remain free of falls  Outcome: Progressing   07/14/16 0844   OTHER   Moderate Risk (6-13) MOD-Initiate Yellow "Fall Risk" magnet communication tool       Problem: Safety  Goal: Patient will be free from injury during hospitalization  Outcome: Progressing   07/14/16 0118   Goal/Interventions addressed this shift   Patient will be free from injury during hospitalization  Assess patient's risk for falls and implement fall prevention plan of care per policy;Provide and maintain safe environment;Use appropriate transfer methods;Ensure appropriate safety devices are available at the bedside;Include patient/ family/ care giver in decisions related to safety;Hourly rounding     Goal: Patient will be free from infection during hospitalization  Outcome: Progressing   07/13/16 1541   Goal/Interventions addressed this shift   Free from Infection during hospitalization Assess and monitor for signs and symptoms of infection;Monitor lab/diagnostic results;Monitor all insertion sites (i.e. indwelling lines, tubes, urinary catheters, and drains);Encourage patient and family to use good hand hygiene technique       Problem: Pain  Goal: Pain at adequate level as identified by patient  Outcome: Progressing   07/13/16 1541   Goal/Interventions addressed this shift   Pain at adequate level as identified by patient Identify patient comfort function goal;Assess pain on admission, during daily assessment and/or before any "as needed" intervention(s);Reassess pain within 30-60 minutes of any procedure/intervention, per Pain Assessment, Intervention, Reassessment (AIR) Cycle;Evaluate if patient comfort function goal is met;Evaluate patient's satisfaction with pain management progress;Offer non-pharmacological pain management interventions;Include patient/patient care companion in decisions related to pain management as needed       Problem: Discharge Barriers  Goal: Patient will be  discharged home or other facility with appropriate resources  Outcome: Progressing   07/14/16 1625   Goal/Interventions addressed this shift   Discharge to home or other facility with appropriate resources Provide appropriate patient education;Provide information on available health resources;Initiate discharge planning       Problem: Psychosocial and Spiritual Needs  Goal: Demonstrates ability to cope with hospitalization/illness  Outcome: Progressing   07/13/16 0047   Goal/Interventions addressed this shift   Demonstrates ability to cope with hospitalizations/illness Encourage verbalization of feelings/concerns/expectations;Provide quiet environment;Assist patient to identify own strengths and abilities;Encourage patient to set small goals for self;Encourage participation in diversional activity;Reinforce positive adaptation of new coping behaviors;Include patient/ patient care companion in decisions       Comments: Pt is A&O x4. ST on tele w/ HR 100's. On primacor gtt chronic. On room air, sats >95%. On cardiac diet and 1.5 L fluid restrict. Oliguric. OOB w/ 1 assist. R FA PIV and R CVL, CDI. Denies any pain. On palliative care. Fall and safety precautions in place. Will continue to monitor.

## 2016-07-14 NOTE — Progress Notes (Signed)
PROGRESS NOTE    Date Time: 07/14/16 4:58 PM  Patient Name: Gregory [name], Gregory [name]                                NEPHROLOGY PROGRESS NOTE    Follow up for acute renal failure  Subjective:   Patient states he is feeling bad. He states his symptoms have not improved.     Medications:      Scheduled Meds: PRN Meds:      aspirin EC 81 mg Oral Daily   bumetanide 6 mg Oral Q12H   chlorothiazide 500 mg Intravenous Once   heparin (porcine) 5,000 Units Subcutaneous Q12H SCH   midodrine 10 mg Oral TID MEALS   potassium chloride 20 mEq Oral Daily   zolpidem 5 mg Oral QHS       Continuous Infusions:  . milrinone 0.375 mcg/kg/min (07/14/16 0837)      acetaminophen 650 mg Q6H PRN         Review of Systems:    No complaints of chest pain, palpitations shortness of breath, cough, headaches, dizziness, joint pain, abdominal pain,  or fevers.     Physical Exam:   Patient Vitals for the past 24 hrs:   BP Temp Temp src Pulse Resp SpO2 Weight   07/14/16 1527 95/65 98.7 F (37.1 C) Axillary 97 16 98 % -   07/14/16 1102 (!) 88/55 97.8 F (36.6 C) Oral 100 16 92 % -   07/14/16 0841 101/69 97.3 F (36.3 C) Oral (!) 103 - 97 % -   07/14/16 0500 - - - - - - 57.1 kg (125 lb 14.1 oz)   07/14/16 0438 97/69 97.4 F (36.3 C) Oral (!) 102 20 100 % -   07/13/16 2347 99/69 97.3 F (36.3 C) Axillary 97 18 98 % -   07/13/16 2116 97/69 - - 97 - 99 % -   07/13/16 1934 95/61 97.4 F (36.3 C) Oral 95 18 97 % -       Intake and Output Summary (Last 24 hours) at Date Time    Intake/Output Summary (Last 24 hours) at 07/14/16 1658  Last data filed at 07/14/16 1600   Gross per 24 hour   Intake          1846.36 ml   Output             1150 ml   Net           696.36 ml       General appearance - alert, well appearing, and in no distress  Mental status - alert, oriented to person, place, and time  Chest - clear to auscultation, no wheezes, rales or rhonchi, symmetric air entry  Heart - S1 and S2 normal, no rub  Abdomen - soft, no tenderness  Neurological - alert,  normal speech  Extremities - pedal edema 3 +, no clubbing or cyanosis    Labs:     Recent Labs  Lab 07/14/16  0503 07/13/16  0339 07/12/16  0509   WBC 4.08 3.27* 3.52   RBC 3.79* 3.69* 3.81*   Hgb 12.4* 11.9* 12.4*   Hematocrit 37.1* 35.9* 37.5*   MCV 97.9 97.3 98.4   MCHC 33.4 33.1 33.1   RDW _0 MPV 11.5 10.8 11.0   Platelets 119* 133* 126*           Recent Labs  Lab 07/14/16  0503 07/13/16  0339 07/12/16  0509 07/11/16  1958   Sodium 131* 132* 131* 128*   Potassium 3.4* 3.4* 4.0 4.0   Chloride 89* 90* 89* 89*   CO2 _0 BUN 122.0* 119.0* 123.0* 133.0*   Creatinine 3.6* 3.8* 4.3* 4.3*   Glucose 101* 98 129* 101*   Calcium 10.1 10.0 10.2 10.4   Magnesium 2.2 1.8 1.9 1.7         Results     Procedure Component Value Units Date/Time    Magnesium [[id number]] Collected:  07/14/16 0503    Specimen:  Blood Updated:  07/14/16 0631     Magnesium 2.2 mg/dL     GFR [[id number]] Collected:  07/14/16 0503     Updated:  07/14/16 0631     EGFR 38.1    Basic Metabolic Panel [[id number]]  (Abnormal) Collected:  07/14/16 0503    Specimen:  Blood Updated:  07/14/16 0631     Glucose 101 (H) mg/dL      BUN 122.0 (H) mg/dL      Creatinine 3.6 (H) mg/dL      Calcium 10.1 mg/dL      Sodium 131 (L) mEq/L      Potassium 3.4 (L) mEq/L      Chloride 89 (L) mEq/L      CO2 26 mEq/L     CBC [[id number]]  (Abnormal) Collected:  07/14/16 0503    Specimen:  Blood from Blood Updated:  07/14/16 0615     WBC 4.08 x10 3/uL      Hgb 12.4 (L) g/dL      Hematocrit 37.1 (L) %      Platelets 119 (L) x10 3/uL      RBC 3.79 (L) x10 6/uL      MCV 97.9 fL      MCH 32.7 (H) pg      MCHC 33.4 g/dL      RDW 13 %      MPV 11.5 fL      Nucleated RBC 0.0 /100 WBC      Absolute NRBC 0.00 x10 3/uL             Rads:   Radiological Procedure reviewed.    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 Patient has developed uremic symptoms. Patient was explained again about prognosis, possible  poor outcomes and possibility not continuing dialysis as an outpatient. He states he would like to consider the options again.     2) Hypotension: on midodrine.     3) Hyponatremia: Related to volume overlad. Continue bumex 6 bid and IV diuril.       Plan:   No indication for dialysis  Consider starting HD in the next 24-48 hours  Continue bumex bid      [name], MD  [facility]  Spectra# [phone]  Pg# [phone]  Office # [phone]  After 430 PM, please call the office for person on call  On Weekends, call the office for the person on call

## 2016-07-14 NOTE — Plan of Care (Addendum)
Problem: Safety  Goal: Patient will be free from injury during hospitalization  Outcome: Progressing  Hourly rounding completed, call bell & possessions within reach, chair & bed brakes on & at lowest height, 3 bed rails up, non-skid socks on, fall mat down   07/14/16 0118   Goal/Interventions addressed this shift   Patient will be free from injury during hospitalization  Assess patient's risk for falls and implement fall prevention plan of care per policy;Provide and maintain safe environment;Use appropriate transfer methods;Ensure appropriate safety devices are available at the bedside;Include patient/ family/ care giver in decisions related to safety;Hourly rounding       Problem: Compromised Tissue integrity  Goal: Damaged tissue is healing and protected  Outcome: Progressing  Reddish 2+ BLE edema, kept foot of bed elevated   07/14/16 0118   Goal/Interventions addressed this shift   Damaged tissue is healing and protected  Monitor/assess Braden scale every shift;Provide wound care per wound care algorithm;Increase activity as tolerated/progressive mobility;Relieve pressure to bony prominences for patients at moderate and high risk;Avoid shearing injuries;Keep intact skin clean and dry;Monitor external devices/tubes for correct placement to prevent pressure, friction and shearing;Encourage use of lotion/moisturizer on skin;Monitor patient's hygiene practices       Problem: Neurological Deficit  Goal: Neurological status is stable or improving  Outcome: Not Progressing  Needs reorientation for intermittent confusion, forgets date & time, bed alarm on   07/14/16 0118   Goal/Interventions addressed this shift   Neurological status is stable or improving Monitor/assess/document neurological assessment (Stroke: every 4 hours);Monitor/assess NIH Stroke Scale;Re-assess NIH Stroke Scale for any change in status;Observe for seizure activity and initiate seizure precautions if indicated       Comments: A&Ox3, needs  reorientation for intermittent confusion. Afebrile, SBP 90s, NSR-ST 90-100s with occasional PVCs on tele, denies dizziness/lightheadedness/palpitations/chest pain. O2 sating >97% on RA, denies SOB. Fair appetite for cardiac diet, voided 550cc, BM in AM shift. OOB with assist x1, refused to walk in hallway, refused SCDs. Reddish 2+ BLE edema, kept foot of bed elevated. Primacor infusing 0.375 mcg/kg/min. R chest PICC CDI, CHG bath completed. Denies any pain. Call bell & possessions on bedside table within reach. Chair & bed locked at lowest position, 2 bed rails up, fall mat down. Continuing to monitor via hourly rounding, will report any changes.

## 2016-07-15 LAB — MAGNESIUM: Magnesium: 2 mg/dL (ref 1.6–2.6)

## 2016-07-15 NOTE — Plan of Care (Signed)
Problem: Safety  Goal: Patient will be free from injury during hospitalization  Pt axox2 slow to respond and forgetful DNR on primacor gtt at 0.375 mcg/kg/min -BP in 90s -low 100s tele SR with pacs/pvcs -Pt mainly bed/chair bound 1 person assist for transfers has unsteady gait on high fall precaution protocol -Pt got oob this am climbed over the side rail and was found at the side of the bed standing  Incontinent of urine -Pt refused to be moved to room 250 for closer observation charge RN also talked to the pt but he refused to be moved  patient voided around 500 cc today -Decision needs to be made by PT/WIFE about starting HD and dialysis catheter placement tomorrow .PT remained stable throughout the shift cont to observe and monitor frequent purposeful rounds were made bed /chair alarm on fall mat present call bell within reach .

## 2016-07-15 NOTE — Progress Notes (Signed)
MEDICINE PROGRESS NOTE    Date Time: 07/15/16 7:23 AM  Patient Name: Gregory [name]  Attending Physician: Lesly Rubenstein, MD    Assessment:   Active Problems:    Uremia    Palliative care by specialist  Resolved Problems:    * No resolved hospital problems. *    Gregory [name] is a 71 y.o. male with PMH of NICM (EF 20-25%) secondary to AL amyloidosis (on milrinone), CKD, HTN and asthma who presents with AKI, likely initiating temporizing dialysis tomorrow. Palliative care also currently involved.     Plan:   #AKI on CKD - Progression of Amyloidosis vs. Cardio-renal syndrome. Creatinine fluctuating, 3.8 today, Creatinine Clearance: 14.9 mL/min. Nephrology currently considering dialysis though his BPs may not tolerate. Patient aware that if HD were to be done, it would be a temporizing measure with the goal of getting him off milrinone, though this may not be possible  - Hold spirolactone  - Continue Bumex 6 mg q 12h  - F/u Nephrology consult, apprec recs. Discussed with Dr. Burna Sis who will likely be moving forward with dialysis tomorrow after dialysis catheter is placed.  - Dr. Ranae Pila following, Appreciate palliative care recs    # H/o NICM HFrEF w/ EF 20-25% on last ECHO 11/19, secondary to AL amyloid, Followed by Dr. May, whom he saw last week (metolazone discontinued). S/p admission on 12/20-12/28 for volume overload and treated with Bumex gtt. Lactate wnl, Bilirubin elevated likely 2/2 HF  - F/u Advanced HF, apprec recs  - Daily standing weight  - Strict I/Os  - Fluid restrict to 1.5 L/day and Na 2 g/day  - Continue home Bumex 6 mg q 12 h  - Continue continuous home milrinone infusion 0.375 mcg/kg/min    #H/o AL cardiac amyloidosis - Followed by locally by Dr. Joneen Caraway ([facility]). Previously treated with 10 cycles CyBorD completed in August, In October he received a 4 week course of Revlimid 5 mg daily with dexamethasone, complicated by fluid retention   - F/u Oncology recs    # Vomiting, resolved  - Continue  to monitor    # Hyponatremia, may be 2/2 renal failure  - Monitor for now    # Hypotension  - Continue home midodrine TID    #Global  F -   Current Facility-Administered Medications   Medication   . milrinone     E - replete PRN  N - Diet cardiac Na restriction, if any: 2 GM NA; Fluid restriction: 1500 ML FLUID  DVT ppx - Heparin subq q12  GI ppx - not indicated  PT/OT - ordered  CODE - NO CPR  -  ALLOW NATURAL DEATH      Safety Checklist:     DVT prophylaxis:  CHEST guideline (See page [id number]) Chemical   Foley:  [place] Rn Foley protocol Not present   IVs:  Peripheral IV   PT/OT: Ordered   Daily CBC & or Chem ordered:  SHM/ABIM guidelines (see #5) Yes, due to clinical and lab instability   Reference for approximate charges of common labs: CBC auto diff - $76  BMP - $99  Mg - $79    Lines:     Patient Lines/Drains/Airways Status    Active PICC Line / CVC Line / PIV Line / Drain / Airway / Intraosseous Line / Epidural Line / ART Line / Line / Wound / Pressure Ulcer / NG/OG Tube     Name:   Placement date:   Placement time:  Site:   Days:    PICC Single Lumen Right Other (Comment)          Other (Comment)        Peripheral IV 07/11/16 Right Forearm  07/11/16    2002    Forearm    less than 1                 Disposition: (Please see PAF column for Expected D/C Date)   Today's date: 07/15/2016  Admit Date: 07/11/2016  8:41 PM  LOS: 4    Subjective       HPI/Subjective: Patient denies any pain at this time. He denies any increased swelling in his legs, abdomen. Denies SOB at rest or chest pain.       Physical Exam:     VITAL SIGNS PHYSICAL EXAM   Temp:  [96.7 F (35.9 C)-98.7 F (37.1 C)] 97.3 F (36.3 C)  Heart Rate:  [97-103] 97  Resp Rate:  [16-18] 18  BP: (88-108)/(55-77) 101/77        Intake/Output Summary (Last 24 hours) at 07/15/16 0723  Last data filed at 07/15/16 0640   Gross per 24 hour   Intake          1646.36 ml   Output             1750 ml   Net          -103.64 ml    Physical Exam  General: awake,  alert X 3, in NAD  HEENT: JVD present  Cardiovascular: regular rate and rhythm, no murmurs, rubs or gallops  Lungs: clear to auscultation bilaterally, without wheezing, rhonchi, or rales  Abdomen: soft, non-tender, non-distended; no palpable masses,  normoactive bowel sounds  Extremities: no LE edema, BLE are warm and well perfused       Meds:     Medications were reviewed:  Current Facility-Administered Medications   Medication Dose Route Frequency   . aspirin EC  81 mg Oral Daily   . bumetanide  6 mg Oral Q12H   . chlorothiazide  500 mg Intravenous Once   . heparin (porcine)  5,000 Units Subcutaneous Q12H SCH   . midodrine  10 mg Oral TID MEALS   . potassium chloride  20 mEq Oral Daily   . zolpidem  5 mg Oral QHS       Labs:     Labs (last 72 hours):      Recent Labs  Lab 07/14/16  0503 07/13/16  0339   WBC 4.08 3.27*   Hgb 12.4* 11.9*   Hematocrit 37.1* 35.9*   Platelets 119* 133*            Recent Labs  Lab 07/14/16  0503 07/13/16  0339 07/12/16  1301   Sodium 131* 132*  --    Potassium 3.4* 3.4*  --    Chloride 89* 90*  --    CO2 26 27  --    BUN 122.0* 119.0*  --    Creatinine 3.6* 3.8*  --    Calcium 10.1 10.0  --    Albumin  --   --  3.8   Protein, Total  --   --  6.9   Bilirubin, Total  --   --  4.1*   Alkaline Phosphatase  --   --  102   ALT  --   --  18   AST (SGOT)  --   --  42*   Glucose 101* 98  --  Microbiology Results     None            Imaging, reviewed and are significant for:  Xr Chest  Ap Portable    Result Date: 07/11/2016    Negative for acute process [name], MD 07/11/2016 8:37 PM    Discussed with Dr. Haig Prophet    Signed by: Carmine Savoy, DO PGY1

## 2016-07-15 NOTE — Progress Notes (Signed)
HEMATOLOGY ONCOLOGY PROGRESS NOTE    Date Time: 07/15/16 1:20 PM  Patient Name: Gregory [name], Gregory [name]    Active Problems:    Uremia    Palliative care by specialist      HISTORY:    71 year old gentleman with cardiac amyloidosis with worsening renal failure, now with uremia, admitted yesterday with symptoms of worsening renal failure with nausea, emesis and poor functioning. The patient was initially seen in December in the process of transferring his care to my practice and was tentatively to start combination thalidomide and dexamethasone. He was initially diagnosed and treated at the [facility] then subsequently transferred his care to [facility].    He seems alert to me today.  He denies any pain       PHYSICAL EXAM:     Vitals:    07/15/16 1101   BP: 100/67   Pulse: (!) 101   Resp: 18   Temp: 97.6 F (36.4 C)   SpO2: 97%       Intake and Output Summary (Last 24 hours) at Date Time    Intake/Output Summary (Last 24 hours) at 07/15/16 1320  Last data filed at 07/15/16 0640   Gross per 24 hour   Intake          1096.36 ml   Output             1550 ml   Net          -453.64 ml         Appearance: in no acute distress  Skin: no rash  HEENT: no icterus, no oral moniliasis or mucositis  Lungs: clear to percussion and ausculation  Heart: regular rate and rhythm  Abdomen: no tenderness, no organomegaly or mass  Extremities: no edema  Neuro: alert, strength grossly intact        ASSESSMENT/PLAN:       1. Amyloidosis with uremia.  I discussed his poor prognosis from the above and the chance that treatment is unlikely.  I brought up the idea of hospice and he said " I am not ready".  Consider pall care consult. Pt is DNR    2. AKI  Slightly improved.  Secondary to #1.  Nephrology following     [name], MD  [facility]  [phone]    MEDS:   Scheduled Meds:  Current Facility-Administered Medications   Medication Dose Route Frequency   . aspirin EC  81 mg Oral Daily   . bumetanide  6  mg Oral Q12H   . chlorothiazide  500 mg Intravenous Once   . heparin (porcine)  5,000 Units Subcutaneous Q12H SCH   . midodrine  10 mg Oral TID MEALS   . potassium chloride  20 mEq Oral Daily   . zolpidem  5 mg Oral QHS     Continuous Infusions:  . milrinone 0.375 mcg/kg/min (07/15/16 1045)     PRN Meds:.acetaminophen     LABS:     Results     Procedure Component Value Units Date/Time    Magnesium [[id number]] Collected:  07/15/16 0355    Specimen:  Blood Updated:  07/15/16 0450     Magnesium 2.0 mg/dL           IMAGING DATA:  Radiology Results (24 Hour)     ** No results found for the last 24 hours. **

## 2016-07-15 NOTE — Progress Notes (Signed)
[facility]  Date Time: 07/15/16 4:47 PM  Patient Name: Gregory [name], Gregory [name]  Social Worker: Vashti Hey          Situation/Subjective:Patient is in bed, awake and alert. At bedside is patient's wife Gregory [name].    Background:71 year old man with AL amyloidosis and nonischemic cardiomyopathy.     Assessment: Dr. Burna Sis was in the room when I arrived. Patient is planning on dialysis tomorrow and getting a catheter for it tomorrow as well. I stayed to discuss patient's upcoming treatment and his choices. Patient seems to understand that he is facing renal failure, heart failure and cancer. Patient had begun his Advanced Directive a few months ago but had not completed it. We had a very long discussion where he wants to continue to name his daughter Gregory [name] and his son Gregory [name]. We discussed his choices as to CPR or not and he chooses not to have aggressive interventions. He was getting more tired and so the form is filled out but we will sign tomorrow. Will return to discuss with patient and have him sign.    Interventions/Plan:   Psychosocial assessment  Advanced Directive    Recommendations:Complete and sign AD    Care network:  Medical decision maker:Patient  Primary caregiver:wife Gregory [name]  Living situation:at home  Advance Directive status: {Advanced Directives not yet completed  Current code status:  NO CPR  -  ALLOW NATURAL DEATH     Cultural/Spiritual factors:   Spiritual framework:Christian  Is this a resource: ?yes    Melissa Noon, [place]  Palliative Medicine Social Worker  [phone] main  [phone] direct  Extend pager: [place] 24/7

## 2016-07-15 NOTE — Progress Notes (Addendum)
PROGRESS NOTE    Date Time: 07/15/16 3:25 PM  Patient Name: Gregory [name],Gregory [name]                                NEPHROLOGY PROGRESS NOTE    Subjective:   Feels ok and denies any cardiac symptoms  For fu of aki/ckd    Medications:      Scheduled Meds: PRN Meds:      aspirin EC 81 mg Oral Daily   bumetanide 6 mg Oral Q12H   chlorothiazide 500 mg Intravenous Once   heparin (porcine) 5,000 Units Subcutaneous Q12H SCH   midodrine 10 mg Oral TID MEALS   potassium chloride 20 mEq Oral Daily   zolpidem 5 mg Oral QHS       Continuous Infusions:  . milrinone 0.375 mcg/kg/min (07/15/16 1045)      acetaminophen 650 mg Q6H PRN         Review of Systems:    No complaints of chest pain, palpitations shortness of breath, cough, headaches, dizziness, joint pain, abdominal pain, nausea, decreased appetite, or fevers.     Physical Exam:   Patient Vitals for the past 24 hrs:   BP Temp Temp src Pulse Resp SpO2 Weight   07/15/16 1517 93/62 97.5 F (36.4 C) Oral 100 18 98 % -   07/15/16 1101 100/67 97.6 F (36.4 C) Oral (!) 101 18 97 % -   07/15/16 0800 - - - 99 - 93 % -   07/15/16 0745 94/67 97.4 F (36.3 C) Oral 93 18 95 % -   07/15/16 0639 - - - - - - 54.3 kg (119 lb 11.2 oz)   07/15/16 0610 101/77 97.3 F (36.3 C) Oral 97 18 100 % -   07/14/16 2320 108/70 (!) 96.7 F (35.9 C) Axillary 100 16 92 % -   07/14/16 2112 107/71 - - (!) 102 - - -   07/14/16 1939 103/65 98.3 F (36.8 C) Oral (!) 102 18 100 % -   07/14/16 1527 95/65 98.7 F (37.1 C) Axillary 97 16 98 % -       Intake and Output Summary (Last 24 hours) at Date Time    Intake/Output Summary (Last 24 hours) at 07/15/16 1525  Last data filed at 07/15/16 0640   Gross per 24 hour   Intake           846.36 ml   Output             1450 ml   Net          -603.64 ml       GEN: Well developed, no distress,  Mouth: No lesions on lips or tongue  Lungs: diffuse rales present, BS nl  Heart: RRR, no murmurs or rubs  LE: edema 2-3+ bilatreal  Skin/mucosa: good turgor, mmm  Psych: Alert,  speech and mood good    Labs:     Recent Labs  Lab 07/14/16  0503 07/13/16  0339 07/12/16  0509   WBC 4.08 3.27* 3.52   RBC 3.79* 3.69* 3.81*   Hgb 12.4* 11.9* 12.4*   Hematocrit 37.1* 35.9* 37.5*   MCV 97.9 97.3 98.4   MCHC 33.4 33.1 33.1   RDW _0 MPV 11.5 10.8 11.0   Platelets 119* 133* 126*           Recent Labs  Lab 07/15/16  0355 07/14/16  0503 07/13/16  0339 07/12/16  0509 07/11/16  1958   Sodium  --  131* 132* 131* 128*   Potassium  --  3.4* 3.4* 4.0 4.0   Chloride  --  89* 90* 89* 89*   CO2  --  _0 BUN  --  122.0* 119.0* 123.0* 133.0*   Creatinine  --  3.6* 3.8* 4.3* 4.3*   Glucose  --  101* 98 129* 101*   Calcium  --  10.1 10.0 10.2 10.4   Magnesium 2.0 2.2 1.8 1.9 1.7         Results     Procedure Component Value Units Date/Time    Magnesium [[id number]] Collected:  07/15/16 0355    Specimen:  Blood Updated:  07/15/16 0450     Magnesium 2.0 mg/dL             Rads:   Radiological Procedure reviewed.    Signed by: [name], MD    Assessment:Plan     AKI/CKD5: Due to advanced cardiorenal syndrome or amyloidosis.  Explained to patient and family member that we are at a time with his renal failure that he either chooses dialysis or chooses to die from renal failure.  Explained risks vs benefits and he understands and consents to proceed with quinton placement and dilaysis tomorrow.  Would avoid acei, arbs, dye studies and nsaids.  Dose meds for gfr<30    Uremia: Will plan for quinton in IR tomorrow and then dialysis    Hypotension: Continue midodrine    Amyloidosis: Per Dr Orbie Pyo note no further plans for rx    CHF: Contyinue bumex and diuril per heart failure service    Hyponatremia:Hypervolemic.  Continue diuresis and will try to remove fluid with dialysis    Hypokalemia:Agree with cardiology would replace to keep level >4    D/w medicine team and Palliative care    [name], MD  [phone]

## 2016-07-15 NOTE — Plan of Care (Addendum)
Problem: Safety  Goal: Patient will be free from injury during hospitalization  Outcome: Progressing  Hourly rounding completed, call bell & possessions within reach, chair & bed brakes on & at lowest height, 3 bed rails up, non-skid socks on, fall mat down   07/15/16 0147   Goal/Interventions addressed this shift   Patient will be free from injury during hospitalization  Assess patient's risk for falls and implement fall prevention plan of care per policy;Provide and maintain safe environment;Use appropriate transfer methods;Ensure appropriate safety devices are available at the bedside;Include patient/ family/ care giver in decisions related to safety;Hourly rounding       Problem: Psychosocial and Spiritual Needs  Goal: Demonstrates ability to cope with hospitalization/illness  Outcome: Progressing  Ambulated in hallway with family in AM shift, but refused in evening   07/15/16 0147   Goal/Interventions addressed this shift   Demonstrates ability to cope with hospitalizations/illness Encourage verbalization of feelings/concerns/expectations;Provide quiet environment;Assist patient to identify own strengths and abilities;Encourage patient to set small goals for self;Encourage participation in diversional activity;Reinforce positive adaptation of new coping behaviors;Include patient/ patient care companion in decisions;Communicate referral to spiritual care as appropriate       Problem: Neurological Deficit  Goal: Neurological status is stable or improving  Outcome: Progressing  Needs frequent reorientation as he gets intermittently forgetful of time & date   07/15/16 0147   Goal/Interventions addressed this shift   Neurological status is stable or improving Monitor/assess/document neurological assessment (Stroke: every 4 hours);Monitor/assess NIH Stroke Scale;Re-assess NIH Stroke Scale for any change in status;Observe for seizure activity and initiate seizure precautions if indicated;Perform CAM Assessment        Comments: A&Ox3, lethargic, needs reorientation for intermittent forgetfulness of date & time. Afebrile, SBP 100s, NSR-ST 90-100s on tele, denies dizziness/lightheadedness/palpitations/chest pain. O2 sating >92% on RA, denies SOB/DOE. Good appetite for cardiac diet with 1500cc fluid restriction, voided 1250cc, BM x1. OOB with standby assist x1, refused to ambulate in hallway, refused SCDs. Reddish 2+ BLE edema kept elevated. Milrinone infusing at 0.327mg/kg/min. R chest PICC CDI, CHG bath completed. Denies any pain. Call bell & possessions on bedside table within reach. Chair & bed locked at lowest position, 2 bed rails up, fall mat down. Continuing to monitor via hourly rounding, will report any changes.

## 2016-07-15 NOTE — Progress Notes (Signed)
Transitional Care Management    Patient re-admitted to inpatient status. CM will discharge from TCM and monitor discharge plan.     Allean Found, MSW  Case Manager  [facility]  Transitional Care Management  T [phone] F [phone]

## 2016-07-16 LAB — CBC AND DIFFERENTIAL
Absolute NRBC: 0 10*3/uL
Basophils Absolute Automated: 0.03 10*3/uL (ref 0.00–0.20)
Basophils Automated: 0.8 %
Eosinophils Absolute Automated: 0.02 10*3/uL (ref 0.00–0.70)
Eosinophils Automated: 0.6 %
Hematocrit: 36.1 % — ABNORMAL LOW (ref 42.0–52.0)
Hgb: 12 g/dL — ABNORMAL LOW (ref 13.0–17.0)
Immature Granulocytes Absolute: 0 10*3/uL
Immature Granulocytes: 0 %
Lymphocytes Absolute Automated: 1.18 10*3/uL (ref 0.50–4.40)
Lymphocytes Automated: 33.3 %
MCH: 32.3 pg — ABNORMAL HIGH (ref 28.0–32.0)
MCHC: 33.2 g/dL (ref 32.0–36.0)
MCV: 97 fL (ref 80.0–100.0)
MPV: 11 fL (ref 9.4–12.3)
Monocytes Absolute Automated: 0.5 10*3/uL (ref 0.00–1.20)
Monocytes: 14.1 %
Neutrophils Absolute: 1.81 10*3/uL (ref 1.80–8.10)
Neutrophils: 51.2 %
Nucleated RBC: 0 /100 WBC (ref 0.0–1.0)
Platelets: 123 10*3/uL — ABNORMAL LOW (ref 140–400)
RBC: 3.72 10*6/uL — ABNORMAL LOW (ref 4.70–6.00)
RDW: 13 % (ref 12–15)
WBC: 3.54 10*3/uL (ref 3.50–10.80)

## 2016-07-16 LAB — BASIC METABOLIC PANEL
BUN: 111 mg/dL — ABNORMAL HIGH (ref 9.0–28.0)
CO2: 27 mEq/L (ref 22–29)
Calcium: 10 mg/dL (ref 8.5–10.5)
Chloride: 90 mEq/L — ABNORMAL LOW (ref 100–111)
Creatinine: 3.2 mg/dL — ABNORMAL HIGH (ref 0.7–1.3)
Glucose: 93 mg/dL (ref 70–100)
Potassium: 3.1 mEq/L — ABNORMAL LOW (ref 3.5–5.1)
Sodium: 134 mEq/L — ABNORMAL LOW (ref 136–145)

## 2016-07-16 LAB — HEMOLYSIS INDEX: Hemolysis Index: 1 (ref 0–18)

## 2016-07-16 LAB — HEPATITIS B SURFACE ANTIBODY: HEPATITIS B SURFACE ANTIBODY: <8

## 2016-07-16 LAB — HEPATITIS C ANTIBODY: Hepatitis C, AB: NONREACTIVE

## 2016-07-16 LAB — GFR: EGFR: 23.4

## 2016-07-16 LAB — HEPATITIS B SURFACE ANTIGEN W/ REFLEX TO CONFIRMATION: Hepatitis B Surface Antigen: NONREACTIVE

## 2016-07-16 MED ORDER — POTASSIUM CHLORIDE CRYS ER 20 MEQ PO TBCR
40.0000 meq | EXTENDED_RELEASE_TABLET | Freq: Once | ORAL | Status: AC
Start: 2016-07-16 — End: 2016-07-16
  Administered 2016-07-16: 40 meq via ORAL
  Filled 2016-07-16: qty 2

## 2016-07-16 NOTE — Plan of Care (Signed)
Problem: Safety  Goal: Patient will be free from injury during hospitalization  Outcome: Progressing   07/16/16 0231   Goal/Interventions addressed this shift   Patient will be free from injury during hospitalization  Assess patient's risk for falls and implement fall prevention plan of care per policy;Provide and maintain safe environment;Use appropriate transfer methods;Include patient/ family/ care giver in decisions related to safety;Ensure appropriate safety devices are available at the bedside;Hourly rounding;Assess for patients risk for elopement and implement Elopement Risk Plan per policy     Goal: Patient will be free from infection during hospitalization  Outcome: Progressing   07/16/16 0231   Goal/Interventions addressed this shift   Free from Infection during hospitalization Monitor lab/diagnostic results;Assess and monitor for signs and symptoms of infection;Monitor all insertion sites (i.e. indwelling lines, tubes, urinary catheters, and drains)       Problem: Pain  Goal: Pain at adequate level as identified by patient  Outcome: Progressing   07/16/16 0231   Goal/Interventions addressed this shift   Pain at adequate level as identified by patient Identify patient comfort function goal;Assess for risk of opioid induced respiratory depression, including snoring/sleep apnea. Alert healthcare team of risk factors identified.;Assess pain on admission, during daily assessment and/or before any "as needed" intervention(s);Reassess pain within 30-60 minutes of any procedure/intervention, per Pain Assessment, Intervention, Reassessment (AIR) Cycle;Evaluate if patient comfort function goal is met;Offer non-pharmacological pain management interventions;Evaluate patient's satisfaction with pain management progress     Patient AOx3 forgetful at times but easily reoriented  OOB with 1 person assist. VSS oxygen saturation 95%, SR/ST on tele. No complaints of pain and discomfort this shift. Patient denies N/V,  voiding well, bowl sounds present. Patient incontinent of urine at times. +2 edema in bilateral lower extremities. 1.5L fluid restriction in place. Patient to have permacath placed today then HD. (R)  Chest PICC C/D/I  and dressing changed by this RN and charge nurse. Primacor drip infusing at 0.375 mcg/kg/min. Safety precautions in place per protocol. Bed in lowest position, call bell within reach, fall mat in place, bed alarm activated. Will continue to monitor patient and report any changes.

## 2016-07-16 NOTE — Progress Notes (Signed)
Patient weight 119.8lb

## 2016-07-16 NOTE — Progress Notes (Signed)
MEDICINE PROGRESS NOTE    Date Time: 07/16/16 8:29 AM  Patient Name: Gregory [name]  Attending Physician: Lesly Rubenstein, MD    Assessment:   Active Problems:    Uremia    Palliative care by specialist  Resolved Problems:    * No resolved hospital problems. *    Gregory [name] is a 71 y.o. 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 Palliative care also currently involved.     Plan:   #AKI on CKD - Progression of Amyloidosis vs. Cardio-renal syndrome. Creatinine fluctuating, 3.8 today, Creatinine Clearance: 14.9 mL/min. Nephrology currently considering dialysis though his BPs may not tolerate. Patient aware that if HD were to be done, it would be a temporizing measure with the goal of getting him off milrinone, though this may not be possible  - Hold spirolactone  - Continue Bumex 6 mg q 12h  - F/u Nephrology consult, apprec recs. Discussed with Dr. Burna Sis yesterday who will likely be moving forward with dialysis today after dialysis catheter is placed.  - Dr. Ranae Pila following, Appreciate palliative care recs    # H/o NICM HFrEF w/ EF 20-25% on last ECHO 11/19, secondary to AL amyloid, Followed by Dr. May, whom he saw last week (metolazone discontinued). S/p admission on 12/20-12/28 for volume overload and treated with Bumex gtt. Lactate wnl, Bilirubin elevated likely 2/2 HF  - F/u Advanced HF, apprec recs  - Daily standing weight  - Strict I/Os  - Fluid restrict to 1.5 L/day and Na 2 g/day  - Continue home Bumex 6 mg q 12 h  - Continue continuous home milrinone infusion 0.375 mcg/kg/min    #H/o AL cardiac amyloidosis - Followed by locally by Dr. Joneen Caraway ([facility]). Previously treated with 10 cycles CyBorD completed in August, In October he received a 4 week course of Revlimid 5 mg daily with dexamethasone, complicated by fluid retention   - F/u Oncology recs    # Vomiting, resolved  -  Continue to monitor    # Hyponatremia, may be 2/2 renal failure  - Monitor for now    # Hypotension  - Continue home midodrine TID    #Global  F -   Current Facility-Administered Medications   Medication   . milrinone     E - replete PRN  N - Diet cardiac Na restriction, if any: 2 GM NA; Fluid restriction: 1500 ML FLUID  DVT ppx - Heparin subq q12  GI ppx - not indicated  PT/OT - ordered  CODE - NO CPR  -  ALLOW NATURAL DEATH      Safety Checklist:     DVT prophylaxis:  CHEST guideline (See page [id number]) Chemical   Foley:  [place] Rn Foley protocol Not present   IVs:  Peripheral IV   PT/OT: Ordered   Daily CBC & or Chem ordered:  SHM/ABIM guidelines (see #5) Yes, due to clinical and lab instability   Reference for approximate charges of common labs: CBC auto diff - $76  BMP - $99  Mg - $79    Lines:     Patient Lines/Drains/Airways Status    Active PICC Line / CVC Line / PIV Line / Drain / Airway / Intraosseous Line / Epidural Line / ART Line / Line / Wound / Pressure Ulcer / NG/OG Tube     Name:   Placement date:   Placement time:  Site:   Days:    PICC Single Lumen Right Other (Comment)          Other (Comment)        Peripheral IV 07/11/16 Right Forearm  07/11/16    2002    Forearm    less than 1                 Disposition: (Please see PAF column for Expected D/C Date)   Today's date: 07/16/2016  Admit Date: 07/11/2016  8:41 PM  LOS: 5    Subjective   HPI/Subjective: Awoke patient from sleep. Patient states that he wants to do dialysis but not sure if he wants to do it today. He denies abdominal pain or swelling, chest pain, SOB, coughing or lower extremity swelling.      Physical Exam:     VITAL SIGNS PHYSICAL EXAM   Temp:  [97.3 F (36.3 C)-98.2 F (36.8 C)] 97.8 F (36.6 C)  Heart Rate:  [98-105] 102  Resp Rate:  [18-20] 18  BP: (84-106)/(55-74) 103/72        Intake/Output Summary (Last 24 hours) at 07/16/16 0829  Last data filed at 07/16/16 0600   Gross per 24 hour   Intake              420 ml   Output               850 ml   Net             -430 ml    Physical Exam  General: awake, alert X 3, though speaking slowly and appears tired, in NAD  Cardiovascular: regular rate and rhythm, no murmurs, rubs or gallops  Lungs: clear to auscultation bilaterally, without wheezing, rhonchi, or rales  Abdomen: soft, non-tender, non-distended; no palpable masses,  normoactive bowel sounds  Extremities: 1+ pitting edema to LLE, no edema to RLE, BLE are warm and well perfused       Meds:     Medications were reviewed:  Current Facility-Administered Medications   Medication Dose Route Frequency   . aspirin EC  81 mg Oral Daily   . bumetanide  6 mg Oral Q12H   . chlorothiazide  500 mg Intravenous Once   . heparin (porcine)  5,000 Units Subcutaneous Q12H [place]   . midodrine  10 mg Oral TID MEALS   . zolpidem  5 mg Oral QHS       Labs:     Labs (last 72 hours):      Recent Labs  Lab 07/16/16  0448 07/14/16  0503   WBC 3.54 4.08   Hgb 12.0* 12.4*   Hematocrit 36.1* 37.1*   Platelets 123* 119*            Recent Labs  Lab 07/16/16  0448 07/14/16  0503  07/12/16  1301   Sodium 134* 131* More results in Results Review  --    Potassium 3.1* 3.4* More results in Results Review  --    Chloride 90* 89* More results in Results Review  --    CO2 27 26 More results in Results Review  --    BUN 111.0* 122.0* More results in Results Review  --    Creatinine 3.2* 3.6* More results in Results Review  --    Calcium 10.0 10.1 More results in Results Review  --    Albumin  --   --   --  3.8   Protein, Total  --   --   --  6.9   Bilirubin, Total  --   --   --  4.1*   Alkaline Phosphatase  --   --   --  102   ALT  --   --   --  18   AST (SGOT)  --   --   --  42*   Glucose 93 101* More results in Results Review  --    More results in Results Review = values in this interval not displayed.                  Microbiology Results     None            Imaging, reviewed and are significant for:  Xr Chest  Ap Portable    Result Date: 07/11/2016    Negative for acute  process [name], MD 07/11/2016 8:37 PM    Discussed with Dr. Haig Prophet    Signed by: Carmine Savoy, DO PGY1

## 2016-07-16 NOTE — Progress Notes (Signed)
[facility]  Date Time: 07/16/16 2:04 PM  Patient Name: Gregory [name], Gregory [name]  Social Worker: Vashti Hey          Situation/Subjective:Patient is in his chair, awake and alert. Present is this SW and also Leigh Ann Caulkins SW.    Background: 71 year old man with AL amyloidosis and nonischemic cardiomyopathy    Assessment: I followed up with patient today to see how he is doing and if he is ready to sign his Advanced Directive. I cannot find it in the room. Patient is awaiting his wife to come to visit and for the start of dialysis. I told patient I would contact his wife. Telephone call to his wife and I learned that AD form is in his bedside table. Patient's wife reports as did patient that they want to proceed with dialysis. I let her know that the staff were awaiting their decision. Will continue to follow and support patient and family .    Interventions/Plan:   Emotional support  Advanced Directives  Care coordination      Care network:  Medical decision maker:patient and his adult children  Primary caregiver: wife  Living situation:at home  Advance Directive status: {Advanced Directives in process  Current code status:  NO CPR  -  ALLOW NATURAL DEATH     Cultural/Spiritual factors:   Spiritual framework: Darrick Meigs  Is this a resource: ?yes  Melissa Noon, [place]  Palliative Medicine Social Worker  [phone] main  [phone] direct  Extend pager: [place] 24/7

## 2016-07-16 NOTE — OT Progress Note (Signed)
[facility]   Occupational Therapy Treatment     Patient: Gregory [name]    MRN#: [id number]   [facility]  Bed: FI262/FI262-01      Discharge Recommendations:   Discharge Recommendation: Home with home health OT, Home with supervision   DME Recommended for Discharge: Shower chair    If Home with home health OT, Home with supervision recommended discharge disposition is not available, patient will need SNF.  Assessment:   Pt is motivated and making progress with ADLS and mobility for self care. Pt completes LB dressing with SBA to donn pants and transfers bed to chair with CG with RW .Pt BP 96/62 HR 107 and SpO2 96 % RA. B UE and LE AROM 10 reps with rests as needed.  Pt limited by decrease actiivity tolerance, strength and balance and is at risk for falls. Pt continues to benefit from OT to maximize I and safety with ADLS and decrease risk of complications from immobility.    Treatment Activities: decrease ADLS    Educated the patient to role of occupational therapy, plan of care, goals of therapy and HEP, safety with mobility and ADLs, energy conservation techniques, discharge instructions, home safety.    Plan:    OT Frequency Recommended: 2-3x/wk     Continue plan of care.       Precautions and Contraindications:   Falls    Updated Medical Status/Imaging/Labs:  reviewed    Subjective:" I want to sit in the chair"   Patient's medical condition is appropriate for Occupational Therapy intervention at this time.  Patient is agreeable to participation in the therapy session. Nursing clears patient for therapy.    Pain:   Scale: denies  Location: na  Intervention: na    Objective:   Patient is in bed with telemetry, IV in place.      Cognition  Alert, follows commands, cues for safety    Functional Mobility  Rolling:  Mod I  Supine to Sit: Mod I  Sit to Stand: CG  Transfers: CG with RW bed to chair    PMP Activity: Step 6 - Walks in Room    Balance  Static Sitting: Good  Dynamic  Sitting: Fair +  Static Standing: Fair + with RW  Dynamic Standing: Fair + RW 3 steps to chair    Self Care and Home Management  Eating: I hand to mouth  Grooming: S sitting   Bathing: Min A simualted sitting  UE Dressing: Mod I sitting  LE Dressing: SBA donn elastic waist pants  Toileting: CG BSC simulated task    Therapeutic Exercises  AORM B UE and LE 1`0 reps in sitting rests as needed    Participation: Good  Endurance: VSS, BP 96/62 HR 107 sitting    Patient left with call bell within reach, all needs met, SCDs not in use as found and declined on board, fall mat in place, bed alarm na, chair alarm on and plugged into wall and all questions answered. RN notified of session outcome and patient response.     Goals:  Time For Goal Achievement: 5 visits  ADL Goals  Patient will dress lower body: Independent, 5 visits  Mobility and Transfer Goals  Pt will perform functional transfers: Independent, 5 visits  Pt will perform shower transfer: Independent, 5 visits                         Exie Parody  [place], [place]    Time of Treatment  OT Received On: 07/16/16  Start Time: 9622  Stop Time: 1046  Time Calculation (min): 31 min    Treatment # 1 of 5 visits

## 2016-07-16 NOTE — Plan of Care (Signed)
Problem: Safety  Goal: Patient will be free from injury during hospitalization  Outcome: Progressing    Goal: Patient will be free from infection during hospitalization  Outcome: Progressing      Problem: Pain  Goal: Pain at adequate level as identified by patient  Outcome: Progressing      Problem: Neurological Deficit  Goal: Neurological status is stable or improving  Outcome: Progressing      Comments: A&Ox3, VSS, SR with PACs, on RA. Tolerating diet, BM this shift, low urine output. Milrinone infusing to RUE PICC line. No complaints of pain. Pt states he is unsure about getting dialysis and wants to talk more about it with his wife. Safety precautions maintained. Will continue to monitor.

## 2016-07-16 NOTE — Progress Notes (Signed)
PROGRESS NOTE    Date Time: 07/16/16 12:08 PM  Patient Name: Gregory [name],Gregory [name]                                NEPHROLOGY PROGRESS NOTE    Subjective:   Now patient does not want dialysis and says he wants to think about it  For fu of aki    Medications:      Scheduled Meds: PRN Meds:      aspirin EC 81 mg Oral Daily   bumetanide 6 mg Oral Q12H   chlorothiazide 500 mg Intravenous Once   heparin (porcine) 5,000 Units Subcutaneous Q12H [place]   midodrine 10 mg Oral TID MEALS   zolpidem 5 mg Oral QHS       Continuous Infusions:  . milrinone 0.375 mcg/kg/min (07/15/16 2222)      acetaminophen 650 mg Q6H PRN         Review of Systems:    No complaints of chest pain, palpitations shortness of breath, cough, headaches, dizziness, joint pain, abdominal pain, nausea, decreased appetite, or fevers.     Physical Exam:   Patient Vitals for the past 24 hrs:   BP Temp Temp src Pulse Resp SpO2 Weight   07/16/16 1112 (!) 88/57 98 F (36.7 C) Oral 98 18 99 % -   07/16/16 0802 103/72 97.8 F (36.6 C) Oral (!) 102 18 99 % -   07/16/16 0635 - - - - - - 54.3 kg (119 lb 12.8 oz)   07/16/16 0632 - - - (!) 105 - - -   07/16/16 0433 106/74 97.3 F (36.3 C) Oral (!) 102 18 95 % -   07/16/16 0000 (!) 84/55 98.2 F (36.8 C) Oral 98 20 96 % -   07/15/16 1958 91/59 97.4 F (36.3 C) Oral 98 18 96 % -   07/15/16 1517 93/62 97.5 F (36.4 C) Oral 100 18 98 % -       Intake and Output Summary (Last 24 hours) at Date Time    Intake/Output Summary (Last 24 hours) at 07/16/16 1208  Last data filed at 07/16/16 1000   Gross per 24 hour   Intake              620 ml   Output             1000 ml   Net             -380 ml       GEN: Well developed, no distress,  Mouth: No lesions on lips or tongue  Lungs: Clear, BS nl  Heart: RRR, no murmurs or rubs  LE: edema 2+ bilatreal  Skin/mucosa: good turgor, mmm  Psych: Alert, speech and mood good    Labs:     Recent Labs  Lab 07/16/16  0448 07/14/16  0503 07/13/16  0339   WBC 3.54 4.08 3.27*   RBC 3.72* 3.79* 3.69*      Hgb 12.0* 12.4* 11.9*   Hematocrit 36.1* 37.1* 35.9*   MCV 97.0 97.9 97.3   MCHC 33.2 33.4 33.1   RDW _0 MPV 11.0 11.5 10.8   Platelets 123* 119* 133*           Recent Labs  Lab 07/16/16  0448 07/15/16  0355 07/14/16  0503 07/13/16  0339 07/12/16  0509 07/11/16  1958   Sodium 134*  --  131* 132* 131* 128*   Potassium 3.1*  --  3.4* 3.4* 4.0 4.0   Chloride 90*  --  89* 90* 89* 89*   CO2 27  --  _0 BUN 111.0*  --  122.0* 119.0* 123.0* 133.0*   Creatinine 3.2*  --  3.6* 3.8* 4.3* 4.3*   Glucose 93  --  101* 98 129* 101*   Calcium 10.0  --  10.1 10.0 10.2 10.4   Magnesium  --  2.0 2.2 1.8 1.9 1.7         Results     Procedure Component Value Units Date/Time    Hepatitis B (HBV) Surface Antibody [place] [[id number]] Collected:  07/16/16 0448    Specimen:  Blood Updated:  07/16/16 0921     HEPATITIS B SURFACE ANTIBODY <8.00    Hepatitis C (HCV) antibody, Total [[id number]] Collected:  07/16/16 0448    Specimen:  Blood Updated:  07/16/16 0921     Hepatitis C, AB Non-Reactive    Hepatitis B (HBV) Surface Antigen [[id number]] Collected:  07/16/16 0448    Specimen:  Blood Updated:  07/16/16 0921     Hepatitis B Surface AG Non-Reactive    Hemolysis index [[id number]] Collected:  07/16/16 0448     Updated:  07/16/16 0851     Hemolysis Index 1    Basic Metabolic Panel [[id number]]  (Abnormal) Collected:  07/16/16 0448    Specimen:  Blood Updated:  07/16/16 0621     Glucose 93 mg/dL      BUN 111.0 (H) mg/dL      Creatinine 3.2 (H) mg/dL      Calcium 10.0 mg/dL      Sodium 134 (L) mEq/L      Potassium 3.1 (L) mEq/L      Chloride 90 (L) mEq/L      CO2 27 mEq/L     GFR [[id number]] Collected:  07/16/16 0448     Updated:  07/16/16 0621     EGFR 23.4    CBC and differential [[id number]]  (Abnormal) Collected:  07/16/16 0448    Specimen:  Blood from Blood Updated:  07/16/16 0604     WBC 3.54 x10 3/uL      Hgb 12.0 (L) g/dL      Hematocrit 36.1 (L) %      Platelets 123 (L) x10 3/uL      RBC 3.72 (L) x10 6/uL      MCV 97.0 fL       MCH 32.3 (H) pg      MCHC 33.2 g/dL      RDW 13 %      MPV 11.0 fL      Neutrophils 51.2 %      Lymphocytes Automated 33.3 %      Monocytes 14.1 %      Eosinophils Automated 0.6 %      Basophils Automated 0.8 %      Immature Granulocyte 0.0 %      Nucleated RBC 0.0 /100 WBC      Neutrophils Absolute 1.81 x10 3/uL      Abs Lymph Automated 1.18 x10 3/uL      Abs Mono Automated 0.50 x10 3/uL      Abs Eos Automated 0.02 x10 3/uL      Absolute Baso Automated 0.03 x10 3/uL      Absolute Immature Granulocyte 0.00 x10 3/uL      Absolute NRBC 0.00 x10 3/uL  Rads:   Radiological Procedure reviewed.    Signed by: [name], MD    Assessment:Plan     AKI/CKD: Due to advanced cardiorenal syndrome or amyloidosis.  No emergent need for dialysis and will wait till patient decides. Will cancel [facility] placement.  Would avoid acei, arbs, dye studies and nsaids.  Dose meds for gfr<30    Uremia: As above.  Also BUN and creatinine are slowly improving which are good signs    Hypotension: Continue midodrine    Amyloidosis: Per Dr Currie Paris    CHF: Contyinue bumex and diuril per heart failure service    Hyponatremia:Hypervolemic.  Continue diuresis     Hypokalemia:Agree with KCl 40 to keep level >4    D/w Adam RN and heart failure service    [name], MD  [phone]

## 2016-07-16 NOTE — Progress Notes (Signed)
After a conversation yesterday, pt is aware of poor prognosis but not yet ready for hospice.  He has been seen by Palliative care.  He will begin dialysis.  I have spoken with Dr Burna Sis.  Although future treatment is not completely contraindicated , it is unlikely to affect overall outcome.  Dr Joneen Caraway to see patient tomorrow

## 2016-07-17 ENCOUNTER — Other Ambulatory Visit: Payer: Self-pay

## 2016-07-17 ENCOUNTER — Encounter
Admission: EM | Disposition: A | Discharge status: Hospice | Payer: Self-pay | Source: Home / Self Care | Attending: Student in an Organized Health Care Education/Training Program

## 2016-07-17 DIAGNOSIS — I5043 Acute on chronic combined systolic (congestive) and diastolic (congestive) heart failure: Secondary | ICD-10-CM

## 2016-07-17 LAB — CBC AND DIFFERENTIAL
Absolute NRBC: 0 10*3/uL
Basophils Absolute Automated: 0.02 10*3/uL (ref 0.00–0.20)
Basophils Automated: 0.6 %
Eosinophils Absolute Automated: 0.03 10*3/uL (ref 0.00–0.70)
Eosinophils Automated: 0.8 %
Hematocrit: 36.7 % — ABNORMAL LOW (ref 42.0–52.0)
Hgb: 12.6 g/dL — ABNORMAL LOW (ref 13.0–17.0)
Immature Granulocytes Absolute: 0.01 10*3/uL
Immature Granulocytes: 0.3 %
Lymphocytes Absolute Automated: 1.09 10*3/uL (ref 0.50–4.40)
Lymphocytes Automated: 30.2 %
MCH: 32.9 pg — ABNORMAL HIGH (ref 28.0–32.0)
MCHC: 34.3 g/dL (ref 32.0–36.0)
MCV: 95.8 fL (ref 80.0–100.0)
MPV: 10.9 fL (ref 9.4–12.3)
Monocytes Absolute Automated: 0.48 10*3/uL (ref 0.00–1.20)
Monocytes: 13.3 %
Neutrophils Absolute: 1.98 10*3/uL (ref 1.80–8.10)
Neutrophils: 54.8 %
Nucleated RBC: 0 /100 WBC (ref 0.0–1.0)
Platelets: 119 10*3/uL — ABNORMAL LOW (ref 140–400)
RBC: 3.83 10*6/uL — ABNORMAL LOW (ref 4.70–6.00)
RDW: 13 % (ref 12–15)
WBC: 3.61 10*3/uL (ref 3.50–10.80)

## 2016-07-17 LAB — BASIC METABOLIC PANEL
BUN: 110 mg/dL — ABNORMAL HIGH (ref 9.0–28.0)
CO2: 26 mEq/L (ref 22–29)
Calcium: 9.9 mg/dL (ref 8.5–10.5)
Chloride: 91 mEq/L — ABNORMAL LOW (ref 100–111)
Creatinine: 3.1 mg/dL — ABNORMAL HIGH (ref 0.7–1.3)
Glucose: 99 mg/dL (ref 70–100)
Potassium: 3.5 mEq/L (ref 3.5–5.1)
Sodium: 132 mEq/L — ABNORMAL LOW (ref 136–145)

## 2016-07-17 LAB — GFR: EGFR: 24.3

## 2016-07-17 SURGERY — NON-TUNNELED CATH PLACEMENT
Anesthesia: Local | Laterality: Right

## 2016-07-17 MED ORDER — DEXAMETHASONE 4 MG PO TABS
20.0000 mg | ORAL_TABLET | Freq: Once | ORAL | Status: AC
Start: 2016-07-17 — End: 2016-07-17
  Administered 2016-07-17: 20 mg via ORAL
  Filled 2016-07-17: qty 5

## 2016-07-17 NOTE — Progress Notes (Signed)
[facility] SOCIAL WORK  Date Time: 07/17/16 11:17 AM  Patient Name: Gregory [name]  Social Worker: Vashti Hey            Background: Gregory [name] is a 71 y.o. male with PMH of NICM (EF 20-25%) secondary to AL amyloidosis (on milrinone), CKD, HTN and asthma who presents with AKI, likely initiating temporizing dialysis today pending Nephro discussion. Onc and Palliative care also currently involved.    Assessment: Contacted Dr. Burna Sis and patient's bedside nurse Caryl Pina about patient's plan of care. Patient and wife DID agree to dialysis today and so patient is scheduled for the [facility] catheter. Dialysis to begin after that, to be scheduled. Will continue to follow. Hope to have patient sign the Advanced Directive.    Interventions/Plan:   Care coordination    Recommendations:Complete Advanced Directives    Care network:  Medical decision maker:patient and his adult children  Primary caregiver:wife  Living situation:at  home  Advance Directive status: {Advanced Directives in process  Current code status:  NO CPR  -  ALLOW NATURAL DEATH     Cultural/Spiritual factors:   Spiritual framework:Christian  Is this a resource: ?yes    Melissa Noon, [place]  Palliative Medicine Social Worker  [phone] main  [phone] direct  Extend pager: [place] 24/7

## 2016-07-17 NOTE — Plan of Care (Signed)
Pt is AAOX3.  NSR on tele. BP soft in the 90s/60s. Pt is on midodrine 3x with meals and primacor gtt. Pt is on RA 95%. BM today 1/31, voiding minimally. 1.5 L fluid restriction. OOB with assist x 1 and pt has refused SCDs. PICC single lumen to R chest wall. CHG bath completed today. It was being discussed if pt would benefit from starting dialysis. However after further discussion with heart failure team, the pt has now decided for the final time he does not want dialysis. Pt will probably be discharge home with hospice. Pt denies pain. Pt's wife and daughter have been updated via telephone. Bed in lowest position, call bell is within reach, and floor mat in place. Will continue to monitor patient and report any changes.

## 2016-07-17 NOTE — Progress Notes (Signed)
PROGRESS NOTE    Date Time: 07/17/16 12:20 PM  Patient Name: Gregory [name],Gregory [name]                                NEPHROLOGY PROGRESS NOTE    Subjective:   Now patient has decided to proceed with dialysis  For fu of aki/ckd    Medications:      Scheduled Meds: PRN Meds:      aspirin EC 81 mg Oral Daily   bumetanide 6 mg Oral Q12H   chlorothiazide 500 mg Intravenous Once   dexamethasone 20 mg Oral Once   heparin (porcine) 5,000 Units Subcutaneous Q12H [facility]   midodrine 10 mg Oral TID MEALS   zolpidem 5 mg Oral QHS       Continuous Infusions:  . milrinone 0.375 mcg/kg/min (07/17/16 0848)      acetaminophen 650 mg Q6H PRN         Review of Systems:    No complaints of chest pain, palpitations shortness of breath, cough, headaches, dizziness, joint pain, abdominal pain, nausea, decreased appetite, or fevers.     Physical Exam:   Patient Vitals for the past 24 hrs:   BP Temp Temp src Pulse Resp SpO2 Weight   07/17/16 1133 - - - 100 - - -   07/17/16 1100 94/64 97.3 F (36.3 C) Oral 100 18 99 % -   07/17/16 0737 98/69 97.5 F (36.4 C) Oral (!) 103 18 96 % -   07/17/16 0330 104/73 98.1 F (36.7 C) Oral (!) 106 18 94 % 55.2 kg (121 lb 11.2 oz)   07/16/16 2331 92/64 97.9 F (36.6 C) Oral - 18 96 % -   07/16/16 2005 99/66 97.9 F (36.6 C) Oral (!) 101 18 98 % -   07/16/16 1538 94/67 97.6 F (36.4 C) Oral 94 18 100 % -       Intake and Output Summary (Last 24 hours) at Date Time    Intake/Output Summary (Last 24 hours) at 07/17/16 1220  Last data filed at 07/17/16 1100   Gross per 24 hour   Intake             1040 ml   Output              550 ml   Net              490 ml       GEN: Well developed, no distress,  Mouth: No lesions on lips or tongue  Lungs: Clear, BS nl  Heart: RRR, no murmurs or rubs  LE: edema as yest bilatreal  Skin/mucosa: good turgor, mmm  Psych: Alert, speech and mood good    Labs:     Recent Labs  Lab 07/17/16  0416 07/16/16  0448 07/14/16  0503   WBC 3.61 3.54 4.08   RBC 3.83* 3.72* 3.79*   Hgb 12.6* 12.0*  12.4*   Hematocrit 36.7* 36.1* 37.1*   MCV 95.8 97.0 97.9   MCHC 34.3 33.2 33.4   RDW _0 MPV 10.9 11.0 11.5   Platelets 119* 123* 119*           Recent Labs  Lab 07/17/16  0416 07/16/16  0448 07/15/16  0355 07/14/16  0503 07/13/16  0339 07/12/16  0509   Sodium 132* 134*  --  131* 132* 131*   Potassium 3.5 3.1*  --  3.4* 3.4* 4.0   Chloride 91* 90*  --  89* 90* 89*   CO2 26 27  --  _0 BUN 110.0* 111.0*  --  122.0* 119.0* 123.0*   Creatinine 3.1* 3.2*  --  3.6* 3.8* 4.3*   Glucose 99 93  --  101* 98 129*   Calcium 9.9 10.0  --  10.1 10.0 10.2   Magnesium  --   --  2.0 2.2 1.8 1.9         Results     Procedure Component Value Units Date/Time    Basic Metabolic Panel [[id number]]  (Abnormal) Collected:  07/17/16 0416    Specimen:  Blood Updated:  07/17/16 0542     Glucose 99 mg/dL      BUN 110.0 (H) mg/dL      Creatinine 3.1 (H) mg/dL      Calcium 9.9 mg/dL      Sodium 132 (L) mEq/L      Potassium 3.5 mEq/L      Chloride 91 (L) mEq/L      CO2 26 mEq/L     GFR [[id number]] Collected:  07/17/16 0416     Updated:  07/17/16 0542     EGFR 24.3    CBC and differential [[id number]]  (Abnormal) Collected:  07/17/16 0416    Specimen:  Blood from Blood Updated:  07/17/16 0524     WBC 3.61 x10 3/uL      Hgb 12.6 (L) g/dL      Hematocrit 36.7 (L) %      Platelets 119 (L) x10 3/uL      RBC 3.83 (L) x10 6/uL      MCV 95.8 fL      MCH 32.9 (H) pg      MCHC 34.3 g/dL      RDW 13 %      MPV 10.9 fL      Neutrophils 54.8 %      Lymphocytes Automated 30.2 %      Monocytes 13.3 %      Eosinophils Automated 0.8 %      Basophils Automated 0.6 %      Immature Granulocyte 0.3 %      Nucleated RBC 0.0 /100 WBC      Neutrophils Absolute 1.98 x10 3/uL      Abs Lymph Automated 1.09 x10 3/uL      Abs Mono Automated 0.48 x10 3/uL      Abs Eos Automated 0.03 x10 3/uL      Absolute Baso Automated 0.02 x10 3/uL      Absolute Immature Granulocyte 0.01 x10 3/uL      Absolute NRBC 0.00 x10 3/uL             Rads:   Radiological Procedure  reviewed.    Signed by: [name], MD    Assessment:Plan     AKI/CKD: Due to advanced cardiorenal syndrome or amyloidosis. Since patient wants to proceed with dialysis plan for [facility] placement and dialysis today. Would avoid acei, arbs, dye studies and nsaids. Dose meds for gfr<30    Uremia: As above.  Had long d/w heart failrue service, medicine and RN about the futi;lity of dialysis given he cannot be dced to op dialysis on milrinone and cannot be off of it    Hypotension: Continue midodrine    Amyloidosis: Per Dr Currie Paris    CHF: Contyinue bumex per heart failure service    Hyponatremia:Hypervolemic. Continue diuresis  Hypokalemia:Agree with KCl 40 to keep level >4      [name], MD  [phone]

## 2016-07-17 NOTE — Plan of Care (Signed)
Spoke to Nephrology team and Heart Failure team who were in agreement of futility of dialysis. I personally spoke to wife and Daughter who agreed to family meeting tomorrow at 4:15 pm to convey the message about patient's prognosis and futility of dialysis as a group. Daughter understood and mentioned they were told about hospice by the [facility] team.    Raylene Miyamoto MD, MPH

## 2016-07-17 NOTE — Plan of Care (Signed)
Problem: Safety  Goal: Patient will be free from injury during hospitalization  Outcome: Progressing   07/17/16 0228   Goal/Interventions addressed this shift   Patient will be free from injury during hospitalization  Use appropriate transfer methods;Provide and maintain safe environment;Include patient/ family/ care giver in decisions related to safety;Hourly rounding     Goal: Patient will be free from infection during hospitalization  Outcome: Progressing      Problem: Pain  Goal: Pain at adequate level as identified by patient  Outcome: Progressing      Problem: Side Effects from Pain Analgesia  Goal: Patient will experience minimal side effects of analgesic therapy  Outcome: Progressing      Problem: Discharge Barriers  Goal: Patient will be discharged home or other facility with appropriate resources  Outcome: Progressing      Problem: Psychosocial and Spiritual Needs  Goal: Demonstrates ability to cope with hospitalization/illness  Outcome: Progressing      Problem: Compromised Tissue integrity  Goal: Damaged tissue is healing and protected  Outcome: Progressing    Goal: Nutritional status is improving  Outcome: Progressing      Problem: Neurological Deficit  Goal: Neurological status is stable or improving  Outcome: Progressing      Comments: Pt A/Ox4, forgetful at times but easy to re-orient. VSS. NSR/ST on tele, HR 990s-low 100s. Pt complaints of pain. Milrinone drip running as ordered. Pt updated on plan of care, all questions. Possible dialysis line placement later today. Wife also updated on plan. Will continue to monitor.

## 2016-07-17 NOTE — PT Progress Note (Addendum)
[facility]   Physical Therapy Cancellation Note      Patient:  Gregory [name] MRN#:  [id number]  [facility] Room/Bed:  FI262/FI262-01    07/17/2016  Time: 10:36 AM       Pt not seen for physical therapy secondary to MD team present, will f/u and complete as schedule permits.      Reattempt at 1045:  Pt refuses to participate, states "I will get up to the chair later". Encouragement and education provided on benefits - pt continues to decline OOB activity. Will f/u and complete as able.         [address], PT, [place] [id number]

## 2016-07-17 NOTE — Progress Notes (Signed)
MEDICINE PROGRESS NOTE    Date Time: 07/17/16 7:04 AM  Patient Name: Gregory [name] T  Attending Physician: Lesly Rubenstein, MD    Assessment:   Active Problems:    Chronic systolic heart failure    Uremia    Palliative care by specialist    Acute renal failure superimposed on chronic kidney disease, unspecified CKD stage, unspecified acute renal failure type  Resolved Problems:    * No resolved hospital problems. *    Gregory [name] is a 71 y.o. male with PMH of NICM (EF 20-25%) secondary to AL amyloidosis (on milrinone), CKD, HTN and asthma who presents with AKI, likely initiating temporizing dialysis today pending Nephro discussion. Onc and Palliative care also currently involved.     Plan:   #AKI on CKD - Progression of Amyloidosis vs. Cardio-renal syndrome. Creatinine now downtrending, 3.5 today (2.3 in November 2017 best Cr in chart), Creatinine Clearance: 24 mL/min. Nephrology currently considering dialysis though his BPs may not tolerate. Patient aware that if HD were to be done, it would be a temporizing measure with the goal of getting him off milrinone, though this may not be possible  - Hold spirolactone  - Continue Bumex 6 mg q 12h  - F/u Nephrology consult, apprec recs. Per last night's note, patient did not want to proceed w/HD, talking over with wife. This am he wants to go through with HD. Follow-up with nephro regarding timing, utility.   - Dr. Ranae Pila following, Appreciate palliative care recs    # H/o NICM HFrEF w/ EF 20-25% on last ECHO 11/19, secondary to AL amyloid, Followed by Dr. May, whom he saw last week (metolazone discontinued). S/p admission on 12/20-12/28 for volume overload and treated with Bumex gtt. Lactate wnl, Bilirubin elevated likely 2/2 HF  - F/u Advanced HF, apprec recs  - Daily standing weight  - Strict I/Os  - Fluid restrict to 1.5 L/day and Na 2 g/day  - Continue home Bumex 6 mg q 12 h  - Continue continuous home milrinone infusion 0.375 mcg/kg/min    #H/o AL cardiac  amyloidosis - Followed by locally by Dr. Joneen Caraway ([facility]). Previously treated with 10 cycles CyBorD completed in August, In October he received a 4 week course of Revlimid 5 mg daily with dexamethasone, complicated by fluid retention   - F/u Oncology recs- per yesterday's note, pt aware of poor prognosis but not ready for hospice. Saw patient today, f/u official recs.     # Vomiting, resolved  - Continue to monitor    # Hyponatremia, may be 2/2 renal failure  - Monitor for now    # Hypotension  - Continue home midodrine TID    #Global  F -   Current Facility-Administered Medications   Medication   . milrinone     E - replete PRN  N - Diet cardiac Na restriction, if any: 2 GM NA; Fluid restriction: 1500 ML FLUID  DVT ppx - Heparin subq q12  GI ppx - not indicated  PT/OT - ordered  CODE - NO CPR  -  ALLOW NATURAL DEATH      Safety Checklist:     DVT prophylaxis:  CHEST guideline (See page [id number]) Chemical   Foley:  [place] Rn Foley protocol Not present   IVs:  Peripheral IV   PT/OT: Ordered   Daily CBC & or Chem ordered:  SHM/ABIM guidelines (see #5) Yes, due to clinical and lab instability   Reference for approximate charges of common labs:  CBC auto diff - $76  BMP - $99  Mg - $79    Lines:     Patient Lines/Drains/Airways Status    Active PICC Line / CVC Line / PIV Line / Drain / Airway / Intraosseous Line / Epidural Line / ART Line / Line / Wound / Pressure Ulcer / NG/OG Tube     Name:   Placement date:   Placement time:   Site:   Days:    PICC Single Lumen Right Other (Comment)          Other (Comment)        Peripheral IV 07/11/16 Right Forearm  07/11/16    2002    Forearm    less than 1                 Disposition: (Please see PAF column for Expected D/C Date)   Today's date: 07/17/2016  Admit Date: 07/11/2016  8:41 PM  LOS: 6    Subjective   HPI/Subjective: He does want to go through with HD today per am discussion, reiterated during team rounds. He denies abdominal pain or swelling, chest pain, SOB, coughing or  lower extremity swelling.      Physical Exam:     VITAL SIGNS PHYSICAL EXAM   Temp:  [97.6 F (36.4 C)-98.1 F (36.7 C)] 98.1 F (36.7 C)  Heart Rate:  [94-106] 106  Resp Rate:  [18] 18  BP: (88-104)/(57-73) 104/73        Intake/Output Summary (Last 24 hours) at 07/17/16 0704  Last data filed at 07/17/16 0533   Gross per 24 hour   Intake             1000 ml   Output              550 ml   Net              450 ml    Physical Exam  General: awake, alert X 3, well appearing, in NAD  Cardiovascular: regular rate and rhythm, no murmurs, rubs or gallops  Lungs: clear to auscultation bilaterally, without wheezing, rhonchi, or rales  Abdomen: soft, non-tender, non-distended; no palpable masses,  normoactive bowel sounds  Extremities: trace edema LLE, no edema to RLE, BLE are warm and well perfused       Meds:     Medications were reviewed:  Current Facility-Administered Medications   Medication Dose Route Frequency   . aspirin EC  81 mg Oral Daily   . bumetanide  6 mg Oral Q12H   . chlorothiazide  500 mg Intravenous Once   . heparin (porcine)  5,000 Units Subcutaneous Q12H [place]   . midodrine  10 mg Oral TID MEALS   . zolpidem  5 mg Oral QHS       Labs:     Labs (last 72 hours):      Recent Labs  Lab 07/17/16  0416 07/16/16  0448   WBC 3.61 3.54   Hgb 12.6* 12.0*   Hematocrit 36.7* 36.1*   Platelets 119* 123*            Recent Labs  Lab 07/17/16  0416 07/16/16  0448  07/12/16  1301   Sodium 132* 134* More results in Results Review  --    Potassium 3.5 3.1* More results in Results Review  --    Chloride 91* 90* More results in Results Review  --    CO2 26 27 More results in Results  Review  --    BUN 110.0* 111.0* More results in Results Review  --    Creatinine 3.1* 3.2* More results in Results Review  --    Calcium 9.9 10.0 More results in Results Review  --    Albumin  --   --   --  3.8   Protein, Total  --   --   --  6.9   Bilirubin, Total  --   --   --  4.1*   Alkaline Phosphatase  --   --   --  102   ALT  --   --   --   18   AST (SGOT)  --   --   --  42*   Glucose 99 93 More results in Results Review  --    More results in Results Review = values in this interval not displayed.                  Microbiology Results     None            Imaging, reviewed and are significant for:  Xr Chest  Ap Portable    Result Date: 07/11/2016    Negative for acute process [name], MD 07/11/2016 8:37 PM    Discussed with Dr. Haig Prophet    Signed by: Karalee Height, DO PGY1

## 2016-07-17 NOTE — Plan of Care (Signed)
Problem: Safety  Goal: Patient will be free from injury during hospitalization  Outcome: Progressing    Goal: Patient will be free from infection during hospitalization  Outcome: Progressing      Problem: Pain  Goal: Pain at adequate level as identified by patient  Outcome: Progressing      Comments:

## 2016-07-17 NOTE — Progress Notes (Signed)
HEMATOLOGY ONCOLOGY PROGRESS NOTE  [facility]   [phone]) [phone]        Date Time: 07/17/16 11:26 AM  Patient Name: Gregory [name],Gregory [name]    Active Problems:    Chronic systolic heart failure    Uremia    Palliative care by specialist    Acute renal failure superimposed on chronic kidney disease, unspecified CKD stage, unspecified acute renal failure type      HISTORY:    71 year old gentleman with amyloidosis with advanced cardiomyopathy on continuous infusion milrinone, admitted with symptoms of worsening renal insufficiency and found also have uremia.  Nephrology has tentatively recommended hemodialysis.  The patient remains undecided as to whether or not he would wish to proceed with this.  The patient recently transferred his care to our practice and we were tentatively to begin combination thalidomide and dexamethasone.         PHYSICAL EXAM:     Vitals:    07/17/16 0737   BP: 98/69   Pulse: (!) 103   Resp: 18   Temp: 97.5 F (36.4 C)   SpO2: 96%       Intake and Output Summary (Last 24 hours) at Date Time    Intake/Output Summary (Last 24 hours) at 07/17/16 1126  Last data filed at 07/17/16 1100   Gross per 24 hour   Intake             1040 ml   Output              550 ml   Net              490 ml     Appearance: NAD  Skin: no rash  HEENT: pharynx clear  Lungs: CTAP  Heart: Distant heart sounds  Abdomen: flat, soft, nontender, without masses  Extremities: 1-2+ LE  edema  Neuro: Alert, appropriate    ASSESSMENT/PLAN:   AL amyloidosis with associated advanced cardiomyopathy on continuous infusion milrinone, now with worsening renal insufficiency.  Remains with significant uremia, creatinine improving with simple diuretic therapy.  Unclear whether or not he is a ideal candidate for hemodialysis.  Overall prognosis for his underlying amyloid disease is quite poor but if he is able to stabilize with medical management it would not be unreasonable to initiate combination treatment with  thalidomide and dexamethasone as he is only had one prior line of therapy, CyBorD at [facility], and has not had a trial of an imid.  I did speak with the patient's wife by phone today and she understands the very low likelihood of benefit from chemotherapy but they wish to proceed.  As such, he will begin low-dose weekly dexamethasone until we can determine whether or not he will go on to receive additional treatment.  He remains DO NOT RESUSCITATE.         [name], MD  [facility]  [phone]    MEDS:   Scheduled Meds:  Current Facility-Administered Medications   Medication Dose Route Frequency   . aspirin EC  81 mg Oral Daily   . bumetanide  6 mg Oral Q12H   . chlorothiazide  500 mg Intravenous Once   . dexamethasone  20 mg Oral Once   . heparin (porcine)  5,000 Units Subcutaneous Q12H SCH   . midodrine  10 mg Oral TID MEALS   . zolpidem  5 mg Oral QHS     Continuous Infusions:  . milrinone 0.375 mcg/kg/min (07/17/16  0848)     PRN Meds:.acetaminophen     Results     Procedure Component Value Units Date/Time    Basic Metabolic Panel [[id number]]  (Abnormal) Collected:  07/17/16 0416    Specimen:  Blood Updated:  07/17/16 0542     Glucose 99 mg/dL      BUN 110.0 (H) mg/dL      Creatinine 3.1 (H) mg/dL      Calcium 9.9 mg/dL      Sodium 132 (L) mEq/L      Potassium 3.5 mEq/L      Chloride 91 (L) mEq/L      CO2 26 mEq/L     GFR [[id number]] Collected:  07/17/16 0416     Updated:  07/17/16 0542     EGFR 24.3    CBC and differential [[id number]]  (Abnormal) Collected:  07/17/16 0416    Specimen:  Blood from Blood Updated:  07/17/16 0524     WBC 3.61 x10 3/uL      Hgb 12.6 (L) g/dL      Hematocrit 36.7 (L) %      Platelets 119 (L) x10 3/uL      RBC 3.83 (L) x10 6/uL      MCV 95.8 fL      MCH 32.9 (H) pg      MCHC 34.3 g/dL      RDW 13 %      MPV 10.9 fL      Neutrophils 54.8 %      Lymphocytes Automated 30.2 %      Monocytes 13.3 %      Eosinophils Automated 0.8 %      Basophils  Automated 0.6 %      Immature Granulocyte 0.3 %      Nucleated RBC 0.0 /100 WBC      Neutrophils Absolute 1.98 x10 3/uL      Abs Lymph Automated 1.09 x10 3/uL      Abs Mono Automated 0.48 x10 3/uL      Abs Eos Automated 0.03 x10 3/uL      Absolute Baso Automated 0.02 x10 3/uL      Absolute Immature Granulocyte 0.01 x10 3/uL      Absolute NRBC 0.00 x10 3/uL

## 2016-07-17 NOTE — Progress Notes (Signed)
Advanced Heart Failure and Transplant Consult Note    Heart Failure/Transplant [facility] [phone]    Listed for transplant:  No.     Assessment:    71 year old man with AL amyloidosis and nonischemic cardiomyopathy.     Non-ischemic cardiomyopathy, EF 20%, NYHA Class 4, ACC/AHA Stage D due to AL amyloidosis   Acute on chronic systolic heart failure, started on milrinone 0.375 mcg/kg/min 04/2016.   AL Amylodosis, responded to CyBorD (January - August 2016) though relapsed (increased serum free light chains from 35 in October 2017 to 378 in October 2017) and was started on revlimid 5 mg QOD on 04/09/16.   Acute on chronic kidney disease, stage 4, with uremia; likely myeloma kidney   Asthma   Osteoarthritis   Hyponatremia, likely hypervolemic   Anorexia   Nausea and vomiting    Recommendations:     Palliative care consultation, recommend hospice given his debilitated state and overall discomfort - his prognosis is extremely poor   Continue bumetanide 6 mg po BID and diuril for volume and symptom management.   Cotninue potassium repletion to maintain K>4 to decrease arrhythmic risk in spite of renal dysfunction   Continue milrinone 0.375 mcg/kg/min   Continue midodrine 10 mg po TID   Oncology consultation with Dr Joneen Caraway for thalidomide and decadron -appreciated.   Monitor I/O, daily weights   As of 07/13/16: DNR/DNI   I discussed overall poor prognosis with patient. We discussed the likely futility of HD with inotropes.    Recommend family meeting to discuss goals of care.     Greater than 50% of this encounter and greater than 35 minutes were spent face to face with the patient counseling regarding, but not limited to explaining the current diagnoses, educating the patient on prognosis.    ---------------------------------------------------------------------------------------------------------  Interval Events: Improved creatinine with diuresis, received chlorothiazide x1; seen by Dr Joneen Caraway: starting  thalidomide and dexamethasone as an inpatient; transitioned appropriately to DNR/DNI on 07/13/16    Subjective:  He may feel marginally worse today, ongoing fatigue/exhaustion, drowsiness and dyspnea.    Patient Active Problem List   Diagnosis   . Ganglion cyst of wrist, right   . Primary localized osteoarthrosis, shoulder region   . Asthma without status asthmaticus   . Acute on chronic combined systolic and diastolic heart failure   . Elevated troponin   . Bilateral lower extremity edema   . Light chain (AL) amyloidosis   . Hyperkalemia   . Chronic renal failure, stage 3 (moderate)   . Cardiac amyloidosis   . Chronic systolic heart failure   . NICM (nonischemic cardiomyopathy)   . Uremia   . Palliative care by specialist   . Acute renal failure superimposed on chronic kidney disease, unspecified CKD stage, unspecified acute renal failure type     Current Meds:    aspirin EC 81 mg Oral Daily   bumetanide 6 mg Oral Q12H   chlorothiazide 500 mg Intravenous Once   heparin (porcine) 5,000 Units Subcutaneous Q12H [place]   midodrine 10 mg Oral TID MEALS   zolpidem 5 mg Oral QHS     Drips:  . milrinone 0.375 mcg/kg/min (07/17/16 0848)     He has No Known Allergies.     Family history: Family history is unknown by patient.    Social history:   Social History     Social History   . Marital status: Married     Spouse name: N/A   . Number of children:  N/A   . Years of education: N/A     Occupational History   . Not on file.     Social History Main Topics   . Smoking status: Never Smoker   . Smokeless tobacco: Never Used   . Alcohol use Yes      Comment: rare   . Drug use: No   . Sexual activity: No     Other Topics Concern   . Not on file     Social History Narrative   . No narrative on file     Objective:    Vital signs in last 24 hours:   Temp:  [97.5 F (36.4 C)-98.1 F (36.7 C)] 97.5 F (36.4 C)  Heart Rate:  [94-106] 103  Resp Rate:  [18] 18  BP: (88-104)/(57-73) 98/69  SpO2: 96 %O2 Device: None (Room air)  Height: 167.6  cm (_0 )  Weight: 55.2 kg (121 lb 11.2 oz); Weight change: 0.862 kg (1 lb 14.4 oz)  Body mass index is 19.64 kg/m.[name]    Telemetry: no significant events    I/O last 3 completed shifts:  In: 1150 [P.O.:1150]  Out: 900 [Urine:900]                     Physical Exam:   General: cachectic, crying, weak appearing, not distressed  HEENT:  Anicteric sclera, JVP >10 cmH2O, no carotid bruits. Carotid pulses are diminished bilaterally  Lungs: Bilateral decreased breath sounds and crackles with decreased effort  Cardiovascular: soft s1 s2, tachycardic rate, regular rhythm, no m/r/g, PMI laterally displaced  Abdominal: thin, soft, NT/ND, +B.S., no HSM.   Extremities: cool to touch, no cyanosis, 1+ bilateral edema, peripheral pulses are decreased bilaterally.  Neuromuscular exam: grossly non-focal, though not formally tested.    Cardiac Studies:   Echocardiogramon 11/18/17shows a left ventricular end-diastolic dimension of 15QM, concentric LVH (walls 15 mm), global HKwith a left ventricular ejection fraction of 20-25%, and grade 3diastolic dysfunction. The right ventricle is dilated with reduced systolic function. The left atrium isdilated and the right atrium isdilated. The aortic valve is trileaflet withoutevidence of stenosis; there is trivialinsufficiency. The mitral valve is morphologically normal with mildregurgitation. There is mildtricuspid regurgitation. Pulmonary systolic pressure is estimated to be 26mHg above right atrial pressure (10-142mg). There is trivialpulmonic insufficiency. A pericardial effusion is notpresent.    ECG Findingson 05/03/16: sinus rhythm, left anterior fascicular block and inferior MI, low voltage. As compared to hisprevious ECG on 11/1/17there is nono significant change.     Right heart catheterizationon 05/07/16 (prior to milrinone)showed a RA pressure of 8 mmHg, RV pressure of 24/8 mmHg, PA pressure of 29/9/16 mmHg, and a PW pressure of 13 mmHg. PA  saturation was 45.8%. Cardiac output by Fick calculation was 1.9 L/min with a corresponding index of 1.2 L/min/m2. Cardiac output by thermodilution was 1.6 L/min with a corresponding index of 0.95 L/min/m2. PVR is 1.6 WU.    CXR 07/11/16: Independently interpreted  No acute process    Lab Results   Component Value Date    WBC 3.61 07/17/2016    HGB 12.6 (L) 07/17/2016    PLT 119 (L) 07/17/2016    NA 132 (L) 07/17/2016    K 3.5 07/17/2016    BUN 110.0 (H) 07/17/2016    CREAT 3.1 (H) 07/17/2016    EGFR 24.3 07/17/2016    MG 2.0 07/15/2016    AST 42 (H) 07/12/2016    ALB 3.8 07/12/2016    INR 1.3 (  H) 04/17/2016    BNP 4,994 (H) 06/05/2016

## 2016-07-18 ENCOUNTER — Ambulatory Visit: Payer: Medicare Other | Admitting: Advanced Heart Failure and Transplant Cardiology

## 2016-07-18 LAB — CBC AND DIFFERENTIAL
Absolute NRBC: 0 10*3/uL
Basophils Absolute Automated: 0.01 10*3/uL (ref 0.00–0.20)
Basophils Automated: 0.7 %
Eosinophils Absolute Automated: 0 10*3/uL (ref 0.00–0.70)
Eosinophils Automated: 0 %
Hematocrit: 35.8 % — ABNORMAL LOW (ref 42.0–52.0)
Hgb: 12 g/dL — ABNORMAL LOW (ref 13.0–17.0)
Immature Granulocytes Absolute: 0 10*3/uL
Immature Granulocytes: 0 %
Lymphocytes Absolute Automated: 0.51 10*3/uL (ref 0.50–4.40)
Lymphocytes Automated: 36.4 %
MCH: 32.3 pg — ABNORMAL HIGH (ref 28.0–32.0)
MCHC: 33.5 g/dL (ref 32.0–36.0)
MCV: 96.5 fL (ref 80.0–100.0)
MPV: 11 fL (ref 9.4–12.3)
Monocytes Absolute Automated: 0.06 10*3/uL (ref 0.00–1.20)
Monocytes: 4.3 %
Neutrophils Absolute: 0.82 10*3/uL — ABNORMAL LOW (ref 1.80–8.10)
Neutrophils: 58.6 %
Nucleated RBC: 0 /100 WBC (ref 0.0–1.0)
Platelets: 117 10*3/uL — ABNORMAL LOW (ref 140–400)
RBC: 3.71 10*6/uL — ABNORMAL LOW (ref 4.70–6.00)
RDW: 13 % (ref 12–15)
WBC: 1.4 10*3/uL — ABNORMAL LOW (ref 3.50–10.80)

## 2016-07-18 LAB — BASIC METABOLIC PANEL
BUN: 109 mg/dL — ABNORMAL HIGH (ref 9.0–28.0)
CO2: 23 mEq/L (ref 22–29)
Calcium: 10 mg/dL (ref 8.5–10.5)
Chloride: 90 mEq/L — ABNORMAL LOW (ref 100–111)
Creatinine: 3.2 mg/dL — ABNORMAL HIGH (ref 0.7–1.3)
Glucose: 142 mg/dL — ABNORMAL HIGH (ref 70–100)
Potassium: 4.1 mEq/L (ref 3.5–5.1)
Sodium: 127 mEq/L — ABNORMAL LOW (ref 136–145)

## 2016-07-18 LAB — GFR: EGFR: 23.4

## 2016-07-18 NOTE — UM Notes (Addendum)
Inpatient CSR    71 y.o. male with PMH of NICM (EF 20-25%) secondary to AL amyloidosis (on milrinone), CKD, HTN and asthma who presents with AKI    2/1 T 97.4-98.1, HR 104, RR 17-18, bp 97/69, Pox 96-99% RA    2/1 Na 127, BUN 109.0, Cr 3.2, WBC 1.40, glucose 142    2/1 Remains on [facility]  Cardiac tele  VS w/ Pox q4hr  Milrinone gtt  Bumex 44m po q12hr    2/1 A/P per Nephrology:  - Renal: CKD 5, with uremic symptoms. Hospice discussion today. I explained to the patient that he has poor prognosis of tolerating HD and also possible not being discharged with HD at home. - Hypotension: On midodrine  - Hyponatremia: Will stop diuril. Related to volume overload and medication.   - Acidosis: AG acidosis. Related to renal failure  Plan:  - Hospice discussion today  - Monitor blood pressure    2/1 A/P per Medicine:  - family discussion today at 4pm regarding prognosis and possible hospice. Onc and Palliative care currently involved  Plan:  #AKI on CKD:  - Progression of Amyloidosis vs. Cardio-renal syndrome  - cannot be discharged with HD and milrinone.   - Family meeting today 4pm   H/o AL cardiacamyloidosis   - F/u Oncology recs- possibly proceeding with thalidomide and dexamethasone despite prognosis, f/u decision made after today's 4pm discussion          DEloy End BSN, RN, ACM          Utilization Review Case Manager  [employer] ph: [phone]

## 2016-07-18 NOTE — PT Progress Note (Signed)
[facility]   Physical Therapy Treatment  Patient:  Gregory [name] MRN#:  [id number]  [facility]  Bed: FI262/FI262-01    Discharge Recommendations:   D/C Recommendations: Home with supervision, Home with home health PT   DME Recommendations: Shower chair    If Home with supervision, Home with home health PT recommended discharge disposition is not available, patient will need SNF.    Assessment:   Patient progressing with therapy, able to ambulate increased distance with SBA/CGA. Patient presents with decreased overall strength and decreased endurance affecting functional mobility. Patient would benefit from continued therapy to maximize functional independence with mobility and safety.    Treatment Activities: Gait training, therex, safety    Educated the patient to role of physical therapy, plan of care, goals of therapy and HEP, safety with mobility and ADLs.    Plan:   PT Frequency: 2-3x/wk    Continue plan of care.       Precautions and Contraindications:   Falls      Updated Medical Status/Imaging/Labs: Reviewed    Subjective:    "I am fine."  Patient's medical condition is appropriate for Physical Therapy intervention at this time.  Patient is agreeable to participation in the therapy session. Nursing clears patient for therapy.    Pain:   Denied      Objective:   Patient is seated in a cardiac chair with IV line, tele  in place. BP in sitting 97/60 and Standing 104/50. HR goes to 120 bpm with activity. RN aware.    Cognition  Alert and Oriented to name and DOB only. Able to follow commands. Disoriented with month/year and date. Decreased safety awareness.    Functional Mobility  Rolling: NT  Supine to Sit: NT. Received OOB  Scooting: SBA  Sit to Stand: CGA  Stand to Sit: CGA  Transfers: CGA    Ambulation  PMP - Progressive Mobility Protocol   PMP Activity: Step 7 - Walks out of Room  Distance Walked (ft) (Step 6,7): 300 Feet     Level of Assistance required:  CGA  Pattern: small steps,,decreased cadence, shuffling  Device Used: RW  Weightbearing Status: FWB BLE  Stair Management: Deferred due to fatigue and HR high            Balance  Static Sitting: Good  Dynamic Sitting: Good  Static Standing: Good with RW  Dynamic Standing: Fair+w/RW    Therapeutic Exercises  Seated:AP's<LAQ's<hip flexion:10 reps    Patient Participation: Good  Patient Endurance: Good-    Patient left with call bell within reach, all needs met, SCDs off as found, fall mat in place, chair alarm on and all questions answered. RN notified of session outcome and patient response.     Goals:  Goals  Goal Formulation: With patient  Time for Goal Acheivement: 3 visits  Goals: Select goal  Pt Will Go Supine To Sit: with supervision  Pt Will Perform Sit To Supine: with supervision  Pt Will Perform Sit to Stand: with supervision  Pt Will Ambulate: > 200 feet, with supervision  Pt Will Go Up / Down Stairs: 6-10 stairs, with stand by assist, With rail      Gasper Sells  PT  Pager [place]    Time of Treatment:  PT Received On: 07/18/16  Start Time: 0488  Stop Time: 1215  Time Calculation (min): 30 min  Treatment # 1 out of 3 visits

## 2016-07-18 NOTE — Progress Notes (Signed)
CAPITAL CARING: HOSPICE:  NEW INFORMATIONAL VISIT REQUEST & DISCHARGE PLANNING : CVSD  ROOM [id number]    ANTICIPATED READINESS DISCHARGE DATE: Uncertain - Attending MD to determine - (patient indicates that he very much wants to go home on Friday, 07/19/2016    ANTICIPATED DISCHARGE PLAN:  Uncertain - likely to home with Hospice - patient indicates that Wife will return to bedside on Fri, 07/19/2016 and that we can confirm/discuss the Discharge Plan details with her at that time    INFO VISIT:  Hospice care was discussed and offered this afternoon with the MD teams and per report, the patient and family accept this Discharge plan.  Per patient request, I have left the How Hospice Works Informational literature with blank Consent form at bedside for Wife to review in the AM.  Wife to contact the Hospice team on Friday, 2/2 whn ready to make arrangements for Discharge Planning with the Hospice team here.    BARRIER TO DISCHARGE:  (potential):  Will need to know the status of the IV PRIMACOR for home; if this is expected for use at home with Hospice for Goal of care of Comfort; then the delivery to Hospital will take 6 hours to be delivered to Hospital from the time I place the order.   (If the Primacor will be stopped for Discharge then this is a non-issue).      NEXT STEPS:   Hospice team will plan to meet with patient and Wife again on Friday, 02/02 to obtian signatures on Hospice consent forms and to make arrangements for the expected/desired Discharge plan with regard to Hospice.    HOSPICE DIAGNOSIS:  CHF    Vipul Cafarelli, RN BSN - [facility] [phone] (UNTIL 730 PM)

## 2016-07-18 NOTE — Plan of Care (Signed)
Problem: Discharge Barriers  Goal: Patient will be discharged home or other facility with appropriate resources  Pt remains on Primacor gtt with stable hemodynamics -Axox3 but forgetful -Pt and family had a long discussion with medicine team and pt requesting home with hospice [place] tomorrow per plan cont to observe.

## 2016-07-18 NOTE — Plan of Care (Signed)
Spoke with [place], [name], and patient's wife. Explained in detail the situation including dialysis not being a feasible option and chemotherapy only attempting to prevent progression. Explained briefly about hospice but they have questions about exactly what hospice involves etc. Would benefit from hospice informational visit.    Raylene Miyamoto

## 2016-07-18 NOTE — Plan of Care (Addendum)
Problem: Safety  Goal: Patient will be free from injury during hospitalization  Outcome: Progressing      Problem: Psychosocial and Spiritual Needs  Goal: Demonstrates ability to cope with hospitalization/illness  Outcome: Progressing      Problem: Neurological Deficit  Goal: Neurological status is stable or improving  Outcome: Progressing      Patient A&Ox3, Code:DNR, SR-ST on tele. BP 90-100's on RA. Patient is OOBx1 person assist. 1.5 fluid restriction. CHG bath done, PICC single lumen to Right chest. Family meeting planned for today.  No complaints of pain. Will continue to monitor plan of care with hourly rounding.

## 2016-07-18 NOTE — Progress Notes (Signed)
Gregory [name]  Patient seen and chart reviewed.     Patient admitted with uremia initially to receive HD, now nephrology says poor prognosis. Cardiology, oncology agree.   No plans for HD, life expectancy is limited, recommend hospice - have placed referral and will share with CM.     Patient up in recliner calling wife, but can not dial phone. Forgetful, mildly confused. Denies pain, dyspnea, nausea, states appetite is good to fair. Aware of meeting. I introduced myself to wife on phone. Explained to patient plan for meeting today at 4 re: next steps in his care. Explained my understanding that HD was not expected to be an option and thus we were recommending hospice care at home. Explained that he has a limited prognosis, of possibly weeks, possibly months. Explained hospice as help at home covered by insurance to ensure that he is comfortable even as he gets worse, and to support his family, to ensure he gets the best care possible with focus on quality of life. He wants to be as string as possible, but also wants to ensure a natural death.      Counseling and coordination of care regarding illness, prognosis options ofr care and hospice> 50% total tome 30 minutes.Plan to also be present for 4pm meeting    [name]. [name], [name]  Team Pager [phone] available 24/7  Coordinator [place] [phone] between 9-4:30pm M-F

## 2016-07-18 NOTE — Progress Notes (Signed)
PROGRESS NOTE    Date Time: 07/18/16 8:21 AM  Patient Name: Gregory [name], Gregory [name]                                NEPHROLOGY PROGRESS NOTE    Follow up for AKI management  Subjective:   Patient states he is doing about the same.     Medications:      Scheduled Meds: PRN Meds:      aspirin EC 81 mg Oral Daily   bumetanide 6 mg Oral Q12H   chlorothiazide 500 mg Intravenous Once   heparin (porcine) 5,000 Units Subcutaneous Q12H SCH   midodrine 10 mg Oral TID MEALS   zolpidem 5 mg Oral QHS       Continuous Infusions:  . milrinone 0.375 mcg/kg/min (07/18/16 0150)      acetaminophen 650 mg Q6H PRN         Review of Systems:    No complaints of chest pain, palpitations shortness of breath, cough, headaches, dizziness, joint pain, abdominal pain, nausea, decreased appetite, or fevers.     Physical Exam:   Patient Vitals for the past 24 hrs:   BP Temp Temp src Pulse Resp SpO2 Weight   07/18/16 0500 - - - - - - 56.2 kg (123 lb 14.4 oz)   07/17/16 2305 102/70 98 F (36.7 C) Oral (!) 104 18 97 % -   07/17/16 2000 103/70 98 F (36.7 C) Oral (!) 107 18 99 % -   07/17/16 1607 103/72 97.9 F (36.6 C) Oral (!) 106 - 93 % -   07/17/16 1133 - - - 100 - - -   07/17/16 1100 94/64 97.3 F (36.3 C) Oral 100 18 99 % -       Intake and Output Summary (Last 24 hours) at Date Time    Intake/Output Summary (Last 24 hours) at 07/18/16 6979  Last data filed at 07/17/16 2305   Gross per 24 hour   Intake              480 ml   Output              250 ml   Net              230 ml       General appearance - alert, well appearing, and in no distress  Mouth - mucous membranes moist, pharynx normal without lesions  Neck - supple, no significant adenopathy  Chest - clear to auscultation, no wheezes, rales or rhonchi, symmetric air entry  Heart - S1 and S2 normal  Abdomen - soft, no tenderness  Neurological - alert, normal speech  Extremities - pedal edema trace +, no clubbing or cyanosis    Labs:     Recent Labs  Lab 07/18/16  0453 07/17/16  0416  07/16/16  0448   WBC 1.40* 3.61 3.54   RBC 3.71* 3.83* 3.72*   Hgb 12.0* 12.6* 12.0*   Hematocrit 35.8* 36.7* 36.1*   MCV 96.5 95.8 97.0   MCHC 33.5 34.3 33.2   RDW _0 MPV 11.0 10.9 11.0   Platelets 117* 119* 123*           Recent Labs  Lab 07/18/16  0453 07/17/16  0416 07/16/16  0448 07/15/16  0355 07/14/16  0503 07/13/16  0339   Sodium 127* 132* 134*  --  131* 132*   Potassium  4.1 3.5 3.1*  --  3.4* 3.4*   Chloride 90* 91* 90*  --  89* 90*   CO2 _0 --  26 27   BUN 109.0* 110.0* 111.0*  --  122.0* 119.0*   Creatinine 3.2* 3.1* 3.2*  --  3.6* 3.8*   Glucose 142* 99 93  --  101* 98   Calcium 10.0 9.9 10.0  --  10.1 10.0   Magnesium  --   --   --  2.0 2.2 1.8         Results     Procedure Component Value Units Date/Time    CBC and differential [[id number]]  (Abnormal) Collected:  07/18/16 0453    Specimen:  Blood from Blood Updated:  07/18/16 0622     WBC 1.40 (L) x10 3/uL      Hgb 12.0 (L) g/dL      Hematocrit 35.8 (L) %      Platelets 117 (L) x10 3/uL      RBC 3.71 (L) x10 6/uL      MCV 96.5 fL      MCH 32.3 (H) pg      MCHC 33.5 g/dL      RDW 13 %      MPV 11.0 fL      Neutrophils 58.6 %      Lymphocytes Automated 36.4 %      Monocytes 4.3 %      Eosinophils Automated 0.0 %      Basophils Automated 0.7 %      Immature Granulocyte 0.0 %      Nucleated RBC 0.0 /100 WBC      Neutrophils Absolute 0.82 (L) x10 3/uL      Abs Lymph Automated 0.51 x10 3/uL      Abs Mono Automated 0.06 x10 3/uL      Abs Eos Automated 0.00 x10 3/uL      Absolute Baso Automated 0.01 x10 3/uL      Absolute Immature Granulocyte 0.00 x10 3/uL      Absolute NRBC 0.00 x10 3/uL     Basic Metabolic Panel [[id number]]  (Abnormal) Collected:  07/18/16 0453    Specimen:  Blood Updated:  07/18/16 0533     Glucose 142 (H) mg/dL      BUN 109.0 (H) mg/dL      Creatinine 3.2 (H) mg/dL      Calcium 10.0 mg/dL      Sodium 127 (L) mEq/L      Potassium 4.1 mEq/L      Chloride 90 (L) mEq/L      CO2 23 mEq/L     GFR [[id number]] Collected:  07/18/16  0453     Updated:  07/18/16 0533     EGFR 23.4                Rads:   Radiological Procedure reviewed.    Signed by: [name], MD    Assessment:   1) Renal: CKD 5, with uremic symptoms. Hospice discussion today. I explained to the patient that he has poor prognosis of tolerating HD and also possible not being discharged with HD at home.     2) Hypotension: On midodrine    3) Hyponatremia: Will stop diuril. Related to volume overload and medication.     4) Acidosis: AG acidosis. Related to renal failure    Plan:   Hospice discussion today  Monitor blood pressure      Jair Lindblad Wyatt Haste, MD  [facility]  Spectra# [phone]  Pg# [phone]  Office # [phone]  After 430 PM, please call the office for person on call  On Weekends, call the office for the person on call

## 2016-07-18 NOTE — Progress Notes (Signed)
Hospice Informational Visit ordered. Referral sent in [facility] to [facility]. Hospice meeting with family today at 4:00 PM.     SW will continue to follow.     Eather Colas, MSW, LSW  Case Management  Care Coordinator  [facility]  T: [phone]

## 2016-07-18 NOTE — Progress Notes (Signed)
MEDICINE PROGRESS NOTE    Date Time: 07/18/16 7:29 AM  Patient Name: Gregory [name] T  Attending Physician: Tonye Royalty, MD    Assessment:   Active Problems:    Acute on chronic combined systolic and diastolic heart failure    Chronic renal failure, stage 3 (moderate)    Chronic systolic heart failure    Uremia    Palliative care by specialist    Acute renal failure superimposed on chronic kidney disease, unspecified CKD stage, unspecified acute renal failure type  Resolved Problems:    * No resolved hospital problems. *    [name] is a 71 y.o. male with PMH of NICM (EF 20-25%) secondary to AL amyloidosis (on milrinone), CKD, HTN and asthma who presents with AKI, family discussion today at 4pm regarding prognosis and possible hospice. Onc and Palliative care currently involved.     Plan:   #AKI on CKD - Progression of Amyloidosis vs. Cardio-renal syndrome. Creatinine now downtrending, 3.5 today (2.3 in November 2017 best Cr in chart), Patient aware that if HD were to be done, it would be a temporizing measure with the goal of getting him off milrinone, cannot be discharged with HD and milrinone.   - Hold spirolactone  - F/u Nephrology consult, apprec recs.   -Family meeting today 4pm    Follow-up with nephro regarding timing, utility.   - Dr. Ranae Pila following, Appreciate palliative care recs    # H/o NICM HFrEF w/ EF 20-25% on last ECHO 11/19, secondary to AL amyloid, Followed by Dr. May, whom he saw last week (metolazone discontinued). S/p admission on 12/20-12/28 for volume overload and treated with Bumex gtt. Lactate wnl, Bilirubin elevated likely 2/2 HF  - F/u Advanced HF, apprec recs  - Daily standing weight  - Strict I/Os  - Fluid restrict to 1.5 L/day and Na 2 g/day  - Continue home Bumex 6 mg q 12 h  - Continue continuous home milrinone infusion 0.375 mcg/kg/min    #H/o AL cardiac amyloidosis - Followed by locally by Dr. Joneen Caraway ([facility]). Previously treated with 10 cycles CyBorD completed in August, In  October he received a 4 week course of Revlimid 5 mg daily with dexamethasone, complicated by fluid retention   - F/u Oncology recs- possibly proceeding with thalidomide and dexamethasone despite prognosis, f/u decision made after today's 4pm discussion.     # Vomiting, resolved  - Continue to monitor    # Hyponatremia, may be 2/2 renal failure  - Monitor for now    # Hypotension  - Continue home midodrine TID    #Global  F -   Current Facility-Administered Medications   Medication   . milrinone     E - replete PRN  N - Diet cardiac  DVT ppx - Heparin subq q12  GI ppx - not indicated  PT/OT - ordered  CODE - NO CPR  -  ALLOW NATURAL DEATH      Safety Checklist:     DVT prophylaxis:  CHEST guideline (See page [id number]) Chemical   Foley:  [place] R[facility] Foley protocol Not present   IVs:  Peripheral IV   PT/OT: Ordered   Daily CBC & or Chem ordered:  SHM/ABIM guidelines (see #5) Yes, due to clinical and lab instability   Reference for approximate charges of common labs: CBC auto diff - $76  BMP - $99  Mg - $79    Lines:     Patient Lines/Drains/Airways Status    Active PICC  Line / CVC Line / PIV Line / Drain / Airway / Intraosseous Line / Epidural Line / ART Line / Line / Wound / Pressure Ulcer / NG/OG Tube     Name:   Placement date:   Placement time:   Site:   Days:    PICC Single Lumen Right Other (Comment)          Other (Comment)        Peripheral IV 07/11/16 Right Forearm  07/11/16    2002    Forearm    less than 1                 Disposition: (Please see PAF column for Expected D/C Date)   Today's date: 07/18/2016  Admit Date: 07/11/2016  8:41 PM  LOS: 7    Subjective   HPI/Subjective: Aware of family meeting today, still wishes to proceed with chemo. Clarified that the team will discuss his prognosis and options this afternoon. Feels "ok" today, no complaints during am rounds.       Physical Exam:     VITAL SIGNS PHYSICAL EXAM   Temp:  [97.3 F (36.3 C)-98 F (36.7 C)] 98 F (36.7 C)  Heart Rate:  [100-107]  104  Resp Rate:  [18] 18  BP: (94-103)/(64-72) 102/70        Intake/Output Summary (Last 24 hours) at 07/18/16 4580  Last data filed at 07/17/16 2305   Gross per 24 hour   Intake              480 ml   Output              250 ml   Net              230 ml    Physical Exam  General: awake, alert X 3, well appearing, in NAD  Cardiovascular: 0-1/9 systolic ejection murmur best LLSB, no rubs or gallops  Lungs: clear to auscultation bilaterally, without wheezing, rhonchi, or rales  Abdomen: soft, non-tender, non-distended; no palpable masses,  normoactive bowel sounds  Extremities: trace edema LLE, no edema to RLE, BLE are warm and well perfused       Meds:     Medications were reviewed:  Current Facility-Administered Medications   Medication Dose Route Frequency   . aspirin EC  81 mg Oral Daily   . bumetanide  6 mg Oral Q12H   . chlorothiazide  500 mg Intravenous Once   . heparin (porcine)  5,000 Units Subcutaneous Q12H [place]   . midodrine  10 mg Oral TID MEALS   . zolpidem  5 mg Oral QHS       Labs:     Labs (last 72 hours):      Recent Labs  Lab 07/18/16  0453 07/17/16  0416   WBC 1.40* 3.61   Hgb 12.0* 12.6*   Hematocrit 35.8* 36.7*   Platelets 117* 119*            Recent Labs  Lab 07/18/16  0453 07/17/16  0416  07/12/16  1301   Sodium 127* 132* More results in Results Review  --    Potassium 4.1 3.5 More results in Results Review  --    Chloride 90* 91* More results in Results Review  --    CO2 23 26 More results in Results Review  --    BUN 109.0* 110.0* More results in Results Review  --    Creatinine 3.2* 3.1* More results in  Results Review  --    Calcium 10.0 9.9 More results in Results Review  --    Albumin  --   --   --  3.8   Protein, Total  --   --   --  6.9   Bilirubin, Total  --   --   --  4.1*   Alkaline Phosphatase  --   --   --  102   ALT  --   --   --  18   AST (SGOT)  --   --   --  42*   Glucose 142* 99 More results in Results Review  --    More results in Results Review = values in this interval not  displayed.                  Microbiology Results     None            Imaging, reviewed and are significant for:  Xr Chest  Ap Portable    Result Date: 07/11/2016    Negative for acute process [name], MD 07/11/2016 8:37 PM    Discussed with Dr. Ivin Booty     Signed by: Karalee Height, DO PGY1

## 2016-07-19 ENCOUNTER — Inpatient Hospital Stay: Payer: Medicare Other

## 2016-07-19 LAB — CBC AND DIFFERENTIAL
Absolute NRBC: 0 10*3/uL
Basophils Absolute Automated: 0 10*3/uL (ref 0.00–0.20)
Basophils Automated: 0 %
Eosinophils Absolute Automated: 0 10*3/uL (ref 0.00–0.70)
Eosinophils Automated: 0 %
Hematocrit: 39 % — ABNORMAL LOW (ref 42.0–52.0)
Hgb: 12.9 g/dL — ABNORMAL LOW (ref 13.0–17.0)
Immature Granulocytes Absolute: 0.01 10*3/uL
Immature Granulocytes: 0.2 %
Lymphocytes Absolute Automated: 1.16 10*3/uL (ref 0.50–4.40)
Lymphocytes Automated: 19.8 %
MCH: 32.4 pg — ABNORMAL HIGH (ref 28.0–32.0)
MCHC: 33.1 g/dL (ref 32.0–36.0)
MCV: 98 fL (ref 80.0–100.0)
MPV: 11.3 fL (ref 9.4–12.3)
Monocytes Absolute Automated: 0.68 10*3/uL (ref 0.00–1.20)
Monocytes: 11.6 %
Neutrophils Absolute: 4.01 10*3/uL (ref 1.80–8.10)
Neutrophils: 68.4 %
Nucleated RBC: 0 /100 WBC (ref 0.0–1.0)
Platelets: 126 10*3/uL — ABNORMAL LOW (ref 140–400)
RBC: 3.98 10*6/uL — ABNORMAL LOW (ref 4.70–6.00)
RDW: 13 % (ref 12–15)
WBC: 5.86 10*3/uL (ref 3.50–10.80)

## 2016-07-19 LAB — BASIC METABOLIC PANEL
BUN: 111 mg/dL — ABNORMAL HIGH (ref 9.0–28.0)
CO2: 25 mEq/L (ref 22–29)
Calcium: 10.3 mg/dL (ref 8.5–10.5)
Chloride: 87 mEq/L — ABNORMAL LOW (ref 100–111)
Creatinine: 3.6 mg/dL — ABNORMAL HIGH (ref 0.7–1.3)
Glucose: 103 mg/dL — ABNORMAL HIGH (ref 70–100)
Potassium: 4.1 mEq/L (ref 3.5–5.1)
Sodium: 129 mEq/L — ABNORMAL LOW (ref 136–145)

## 2016-07-19 LAB — GFR: EGFR: 20.5

## 2016-07-19 MED ORDER — HYDROMORPHONE HCL 1 MG/ML PO LIQD
0.5000 mg | ORAL | 0 refills | Status: DC | PRN
Start: 2016-07-19 — End: 2016-07-20

## 2016-07-19 MED ORDER — ZOLPIDEM TARTRATE 5 MG PO TABS
10.0000 mg | ORAL_TABLET | Freq: Every evening | ORAL | Status: DC
Start: 2016-07-19 — End: 2016-07-20
  Administered 2016-07-19: 10 mg via ORAL
  Filled 2016-07-19: qty 2

## 2016-07-19 MED ORDER — POLYETHYLENE GLYCOL 3350 17 G PO PACK
17.0000 g | PACK | Freq: Every day | ORAL | 0 refills | Status: DC | PRN
Start: 2016-07-19 — End: 2016-07-20

## 2016-07-19 MED ORDER — HYDROMORPHONE HCL 1 MG/ML PO LIQD
0.5000 mg | ORAL | Status: DC | PRN
Start: 2016-07-19 — End: 2016-07-20

## 2016-07-19 MED ORDER — POLYETHYLENE GLYCOL 3350 17 G PO PACK
17.0000 g | PACK | Freq: Every day | ORAL | Status: DC | PRN
Start: 2016-07-19 — End: 2016-07-20

## 2016-07-19 MED ORDER — LORAZEPAM 2 MG/ML PO CONC
1.0000 mg | ORAL | Status: DC | PRN
Start: 2016-07-19 — End: 2016-07-20
  Filled 2016-07-19 (×6): qty 1

## 2016-07-19 MED ORDER — LORAZEPAM 2 MG/ML PO CONC
1.0000 mg | ORAL | 0 refills | Status: DC | PRN
Start: 2016-07-19 — End: 2016-07-20

## 2016-07-19 NOTE — Progress Notes (Addendum)
INFORMATION FOR [facility] FOR IV MILRINONE FOR [facility] PATIENT    [facility] TO DELIVER NEW IV MILRINONE TO 0.375 mcg to [facility] today by 1130 PM or when possible today    Room CVSD 250, [place], [facility], [address] [place], [place]  [address]    Dominica Severin, RN BSN - 24 HR # [id number]    VASCULAR ACCESS:  Recent Surgery, Date of Surgery, and Provider Performing:   Procedure(s):  NON-TUNNELED CATH PLACEMENT  07/17/2016  Surgeon(s):  Radiology, Central Venous    Allergies:   No Known Allergies    Height and Weight:     Ht Readings from Last 1 Encounters:   07/12/16 1.676 m (_0 )     Wt Readings from Last 1 Encounters:   07/19/16 57.2 kg (126 lb 3.2 oz)

## 2016-07-19 NOTE — Progress Notes (Signed)
Events reviewed from the chart  Patient and family have decided to enroll into hospice.   I agree with plan. If any further questions, please contact us.   Thank you for allowing [place] to be part of Mr.Kai's care.       [name], MD  [facility]  Spectra# [phone]  Pg# [phone]  Office # [phone]  After 430 PM, please call the office for person on call  On Weekends, call the office for the person on call

## 2016-07-19 NOTE — Consults (Signed)
CONSULTATION    Date Time: 07/19/16 6:39 PM  Patient Name: Gregory [name], Gregory [name]  Requesting Physician: Tonye Royalty, MD      Reason for Consultation:   Assess care needs in light of episodic confusion and weakness w/ recent fall in light of severe systemic amyloidosis complicated by CHF and severe CKD.    Assessment:   [name] is a 71 year old man with systemic amyloidosis complicated by severe CHF and severe CKD complicated by borderline functional status w/ fall earlier today fortunately without apparent injury.    Plan:   Following pertains to anticipated care at home, with discussion w/ Mr. Proby and his wife at bedside as well as Quincy Carnes, hospice RN.  Also collaborated w/ cardiologist, Dr. Chanetta Marshall regarding Milrinone infusion, as well as hospitalist, Dr. Haze Rushing, about overall POC.    Defer inpatient management of symptoms to Palliative Care team.    -We will continue milrinone infusion at current dosing which has been the same for past 3 months since starting it.  As per my discussion w/ Dr. Albesa Seen, there is marginal benefit w/ trying to increase dosing now with goal being to improve energy level and functional status.  There is just too much risk of causing hypotension.  This was conveyed by Mr. Gwendolyn Grant to Mrs. Monsanto.    -Discussed end-point for milrinone infusion.  Recommended that we discontinue altogether (no need to wean as already low dose) when he becomes bedbound.      -Confirmed no hemodialysis will be pursued in light of his severe, progressive AL amyloidosis.    -Confirmed that he remains ambulatory and able to go to bathroom, even though he fell this morning.  Explained that we will need to continuously reassess his care needs as he will continue to weaken.  There is growing risk for repeat falls and bowel/bladder incontinence.  Mrs. Godsey verbalized understanding. For now, it is safe for him to go home tomorrow, after DME is delivered to his home.    >50% time  spent in counseling and coordination of care, total time 50 minutes.    History:   Gregory [name] is a 71 y.o. male who presents to the hospital on 07/11/2016 with acute on chronic renal insufficiency w/ recurrent emesis.  His diuretics were adjusted.  He has not had hemodialysis due to his AL amyloidosis.  Most recent creatinine 3.6 w/ GFR 20.5.  He currently denies SOB, nausea, pruritis, or myoclonus.    Of note, he fell early this morning.  CT of head and neck ruled out any intracranial bleed or cervical spine fracture.    Past Medical History:     Past Medical History:   Diagnosis Date   . Arthritis     bilat shoulders, lt knee   . Asthma without status asthmaticus     childhood   . Cardiac amyloidosis    . CHF (congestive heart failure)    . Fracture of unspecified bones     1974 lt shoulder   . Gout    . Malignant neoplasm        Past Surgical History:     Past Surgical History:   Procedure Laterality Date   . ARTHROPLASTY, SHOULDER, TOTAL  06/18/2013    Procedure: ARTHROPLASTY, SHOULDER, TOTAL;  Surgeon: Wendi Snipes, MD;  Location: [facility];  Service: Orthopedics;  Laterality: Left;   . COLONOSCOPY     . EXCISION, GANGLION  12/19/2012    Procedure: EXCISION,  GANGLION;  Surgeon: Jillyn Hidden, MD;  Location: [facility];  Service: Orthopedics;  Laterality: Right;  injection right and left shoulder   . HERNIA REPAIR      lt inguinal age 61   . INJECTION, MEDICATION  12/19/2012    Procedure: INJECTION, MEDICATION;  Surgeon: Jillyn Hidden, MD;  Location: [facility];  Service: Orthopedics;  Laterality: Bilateral;       Family History:     Family History   Problem Relation Age of Onset   . Family history unknown: Yes       Social History:     Social History     Social History   . Marital status: Married     Spouse name: N/A   . Number of children: N/A   . Years of education: N/A     Social History Main Topics   . Smoking status: Never Smoker   . Smokeless tobacco: Never Used   . Alcohol use Yes       Comment: rare   . Drug use: No   . Sexual activity: No     Other Topics Concern   . Not on file     Social History Narrative   . No narrative on file       Allergies:   No Known Allergies    Medications:     Current Facility-Administered Medications   Medication Dose Route Frequency   . aspirin EC  81 mg Oral Daily   . bumetanide  6 mg Oral Q12H   . heparin (porcine)  5,000 Units Subcutaneous Q12H SCH   . midodrine  10 mg Oral TID MEALS   . zolpidem  10 mg Oral QHS       Review of Systems:   No uremic or musculoskeletal symptoms per HPI.    Physical Exam:     Vitals:    07/19/16 1533   BP: 102/72   Pulse: (!) 106   Resp: 18   Temp: 97.8 F (36.6 C)   SpO2: 96%       Intake and Output Summary (Last 24 hours) at Date Time    Intake/Output Summary (Last 24 hours) at 07/19/16 1839  Last data filed at 07/18/16 2200   Gross per 24 hour   Intake                0 ml   Output              150 ml   Net             -150 ml       Exam:  Able to hold attention for a few minutes then dozes off.  At end of exam, he was able to stand up at edge of bed for several minutes, without difficulty.  Dry oral mucosa, swollen tongue, nonlabored respirations without wheezing, no dependant edema, moves all extremities readily without any pain.    Labs Reviewed:     Results     Procedure Component Value Units Date/Time    CBC and differential [[id number]]  (Abnormal) Collected:  07/19/16 0409    Specimen:  Blood from Blood Updated:  07/19/16 0638     WBC 5.86 x10 3/uL      Hgb 12.9 (L) g/dL      Hematocrit 39.0 (L) %      Platelets 126 (L) x10 3/uL      RBC 3.98 (L) x10 6/uL  MCV 98.0 fL      MCH 32.4 (H) pg      MCHC 33.1 g/dL      RDW 13 %      MPV 11.3 fL      Neutrophils 68.4 %      Lymphocytes Automated 19.8 %      Monocytes 11.6 %      Eosinophils Automated 0.0 %      Basophils Automated 0.0 %      Immature Granulocyte 0.2 %      Nucleated RBC 0.0 /100 WBC      Neutrophils Absolute 4.01 x10 3/uL      Abs Lymph Automated 1.16 x10  3/uL      Abs Mono Automated 0.68 x10 3/uL      Abs Eos Automated 0.00 x10 3/uL      Absolute Baso Automated 0.00 x10 3/uL      Absolute Immature Granulocyte 0.01 x10 3/uL      Absolute NRBC 0.00 x10 3/uL     Basic Metabolic Panel [[id number]]  (Abnormal) Collected:  07/19/16 0409    Specimen:  Blood Updated:  07/19/16 0600     Glucose 103 (H) mg/dL      BUN 111.0 (H) mg/dL      Creatinine 3.6 (H) mg/dL      Calcium 10.3 mg/dL      Sodium 129 (L) mEq/L      Potassium 4.1 mEq/L      Chloride 87 (L) mEq/L      CO2 25 mEq/L     GFR [[id number]] Collected:  07/19/16 0409     Updated:  07/19/16 0600     EGFR 20.5            Rads:   Radiological Procedure reviewed.     Signed by: Toribio Harbour

## 2016-07-19 NOTE — RRT Follow Up Note (Signed)
Gregory [name] is a  71 y.o.  male admitted 07/11/2016 with AKI.  The Rapid Response Team was activated on 2/2 at 0610 for unwitnessed fall.  Currently the patients mental status is alert.  Vital signs are:   Temperature 97.8 F (36.6 C), heart rate  (!) 106, blood pressure 128/76, respirations 18, pulse ox 97.  CT head and neck negative C-Spine cleared by resident patient denies any complaints at this time.

## 2016-07-19 NOTE — Progress Notes (Addendum)
[facility]: HOSPICE:   DISCHARGE PLAN CONFIRMATION:   CV[facility] ROOM 250    MD SIGNATURE REQUIRED: on Hospice scripts (liquid Dilaudid, Ativan & Miralax) in Green Chart binder (Dr. Merrie Roof is aware to sign)     ANTICIPATED READINESS DISCHARGE DATE: 5 PM on SAT 07/20/16 - Wife to arrive after that time to drive the patient home    ANTICIPATED DISCHARGE PLAN:  home on SAT 2/3 to start Hospice care with IV MILRINONE to be provided by Hospice and hooked-up prior to departure at 5 PM on SAT 2/3    INFO VISIT:  Dr. Consuella Lose (Hospice MD) and I met with patient and Wife to discuss and offer Hospice care.  Patient and Wife accept this Discharge plan.  We discussed with Attending MD Dr. Merrie Roof and also collaborated with Cardiology team (Dr. Albesa Seen) RE: IV Milrinone for Home with Hospice.   Dr. Albesa Seen approves of plan to continue with the IV MILRINONE at home with Hospice at the current setting.    DME has been ordered to be delivered to the home in time range of 10 AM to 4 PM on SAT 07/20/16 and Wife will stay home to wait for delivery.    IV MILRINONE:  Has been ordered from [facility] (the contracted-provider for Hospice) to deliver the new IV MILRINONE shipment to Hospital room today - could arrive as late as 155 PM today - this shipment should be secured in the patient room or med room until Discharge.  Hospice RN will connect the IV MILRINONE to the patient at the time of Departure after 5 PM on SAT 07/20/16    TRANSPORT:  NONE needed ; Wife will drive patient home     NEXT STEPS: Hospice RN will connect the IV MILRINONE to the patient at the time of Departure after 5 PM on SAT 07/20/16 and then Wife will drive him home     IV MILRINONE ORDERED / SCRIPT SIGNED:   YES - faxed to [facility] before 5 PM today so that it can be delivered to [place] today in anticipation of Departure tomorrow    CONSENT SIGNED:  YES    EHT FOR IV MILRINONE SIGNED:  YES    CERT FORM SIGNED:  YES    DNR FORM SIGNED:    Nadeen Landau MED SCRIPTS SIGNED:  NO - Hospice scripts (liquid Dilaudid, Ativan & Miralax) in Green Chart binder (Dr. Merrie Roof is aware to sign)     DME ORDERED:   YES:  Hospital Bed, bed table, Oxygen 2 Liters NC PRN, Gilford Rile, Wheelchair, Bedside Commode, Electronics engineer    ADDRESS:   [address] [place], [place]  [address]     [place]:  1) [phone] or 2) [phone]     Dominica Severin, RN BSN - SPECTRA [phone] (UNTIL 730 PM)

## 2016-07-19 NOTE — PT Progress Note (Addendum)
[facility]   Physical Therapy Cancellation Note      Patient:  Gregory [name] MRN#:  [id number]  [facility]  [facility] Room/Bed:  FI262/FI262-01    07/19/2016  Time: 8:01 AM       Pt not seen for physical therapy secondary to rapid response for unwitnessed fall. Will f/u and complete as schedule permits.       [address] [place], [place] [id number]

## 2016-07-19 NOTE — Progress Notes (Signed)
Informed by Eather Colas, discharge planner, that patient and spouse has agreed to go home with [facility]. TC to [facility], [employer] rep. and informed.

## 2016-07-19 NOTE — Progress Notes (Signed)
[place] SOCIAL WORK  Date Time: 07/19/16 11:58 AM  Patient Name: Gregory [name]  Social Worker: Vashti Hey          Situation/Subjective: Patient is in bed, restless and tells [place] he cannot sleep. Present is this SW and Annamarie Dawley SW.    Background:Gregory Dunnaway Smithis a 71 y.o.malewith PMH of NICM (EF 20-25%) secondary to AL amyloidosis (on milrinone), CKD, HTN and asthma who presents with AKI, likely initiating temporizing dialysis today pending Nephro discussion. Onc andPalliative care also currently involved.     Assessment: I am following up on patient as he has  Now decided to go home with hospice care. However hospice consents have not been signed. Patient does know he is going home but does not know when.  We tried various suggestions to calm patient and help him rest but he refused these. I agreed to speak to the attending about his sleep issue. I also agreed to call his wife, Gregory [name]. I reached her by phone and she is confused as well about signing hospice consents. She can come to the hospital today at 4 and I alerted [facility] that wife Gregory [name] can come at 4 PM today. She has questions about hospice equipment and is also concerned if patient can'[name] sign if she needs to bring patients son as she is not patient's medical decision maker. Patient's wife will bring patient's son along in case. Will alert CM to this plan.    Interventions/Plan:   Education on hospice  Care coordination    Recommendations: Home with hospice    Care network:  Medical decision maker:patient and his adult children  Primary caregiver:wife  Living situation:at home  Advance Directive status: {Advanced Directives not completed  Current code status:  NO CPR  -  [place], [place]  Palliative Medicine Social Worker  [phone] main  [phone] direct  Extend pager: [place] 24/7

## 2016-07-19 NOTE — Progress Notes (Signed)
Patient Care Conference Note    Patient Name: Gregory [name]:      Meeting Date: 07/19/16     Meeting Time: Start  5:00      Stop 5:40  Total Time spent:  40  Time spent face to face:  30    Purpose of Meeting:  Discuss Goals of care  Discuss Plan of care  Discuss Disposition  Other hemodialysis    Meeting Participants:   Patient , Spouse, Child (ren) and Palliative Care Physician      Summary of medical condition/ treatment options presented / Prognosis:  Reviewed medical condition, discussed options for care and concerns regarding initiation of HD (potential for hypotension, intolerance, need for HD access) also discussed inability to get HD outside hospital with IV milrinone, and goal of HD to come off milrinone as the only way for him to continue HD outside hospital. Explained extremely low likelihood that this will occur. Discussed initiation of HD and expectation of daily or every other day HD in house (determined by nephrology) with goal to try to wean milrinone over next week, but if unable to wean from milrinone, would have to cease HD.   Alternatively discussed not initiating HD and continuing milrinone understanding that it is expected that renal failure will get worse, and he will likely die from this. Discussed expectations of getting more weak, tired, confused and comfort measures to handle this.     Discussed option of home with hospice care if decision against HD in order to support he and family, and ensure comfort until death, which could be weeks or months, more likely weeks.    Discussed potential discharge in next day or 2 if choosing hospice. Also discussed hospice philosophy of accepting terminal nature of illness with decision for comfort as priority, ability to see go to doctors appointments and continue medications, but that most people do not want to come back to the hospital/ER, as the preferred location of death is often home and the role of hospice is to ensure comfort and ensure peace  at time of death.    Family perspective/comments/concerns expressed:  Patient asked for family opinions and wife and both children do not think HD is a good idea. They were grateful for the recommendations, and felt that HD was not the ideal path and wanted help making this recommendation to him. He is accepting of this and wants to go home with hospice. He defers planning and details to his wife.       Medical Desicions and Plan of care:   No hemodialysis.  Continue current medications  Hospice planing    Discussed with Dr. Haig Prophet - will communicate with Advanced heart team, nephrology and Dr. Merrie Roof    Signed by: Candy Sledge

## 2016-07-19 NOTE — OT Progress Note (Signed)
Occupational Therapy Cancellation Note    Patient: Gregory [name]  HLK:[id number]    Unit: [id number]    Patient not seen for occupational therapy secondary to rapid response for unwitnessed fall, pending CT of head - off floor at this time. Will f/u and complete as schedule permits.  Glory Buff OTR/L   Pager # [phone]

## 2016-07-19 NOTE — Significant Event (Signed)
Rapid response called during am sign-out, 6 am. I arrived with rapid response team for unwitnessed fall. Per nursing, nurse heard fall and arrived right away. Patient was lying on mat, immediately responsive and sat up. Nursing team had placed C-collar. Patient states he was going to the bathroom, tripped, hit the right side of his head. Never lost consciousness. No weakness, dizziness, blurry vision, vomiting. No c-spine tenderness or focal deficit on exam. CT head/neck ordered STAT per RRT.       Karalee Height, DO  [facility] Resident, PGY 1

## 2016-07-19 NOTE — Plan of Care (Signed)
Problem: Neurological Deficit  Goal: Neurological status is stable or improving  Outcome: Progressing  Pt alert to self only agitated at times with increased  Confusion today -frequent purposeful rounds were made throughout the shift bed alarm on with fall mat present Pt on Primacor gtt tele ST bp 90s to low 100s -Pvoided around 550 cc today -Pt to be discharged home tomorrow afternoon with hospice -

## 2016-07-19 NOTE — Significant Event (Signed)
Patient at 0600 was found on fall matt after hearing a thud sounds from [employer] and [employer]. RRT team called while this RN held C-spine until C-collar was placed. Patient transport to CT with Charge nurse for Stat ct of head and neck. Patient was stable at all times during event. CT was complete and was negative for any bleed or fracture. C-collar was removed by Medicine team Resident Karalee Height, DO. at [phone]. Safety always completed and notified patient family of event.

## 2016-07-19 NOTE — Progress Notes (Addendum)
MEDICINE PROGRESS NOTE    Date Time: 07/19/16 2:15 PM  Patient Name: Gregory [name]  Attending Physician: Tonye Royalty, MD    Assessment:   Active Problems:    Acute on chronic combined systolic and diastolic heart failure    Chronic renal failure, stage 3 (moderate)    Chronic systolic heart failure    Uremia    Palliative care by specialist    Acute renal failure superimposed on chronic kidney disease, unspecified CKD stage, unspecified acute renal failure type  Resolved Problems:    * No resolved hospital problems. *    Gregory [name] is a 71 y.o. male with PMH of NICM (EF 20-25%) secondary to AL amyloidosis (on milrinone), CKD, HTN and asthma who presents with AKI, decision made by patient and family to go home with hospice care, consents/hospice forms still yet to be completed. Onc and Palliative care currently involved, likely home 2/3 per patient and family wishes as long as hospice coordination is in place.     Plan:   #AKI on CKD - Progression of Amyloidosis vs. Cardio-renal syndrome. Creatinine now downtrending, 3.5 today (2.3 in November 2017 best Cr in chart), Patient aware that if HD were to be done, it would be a temporizing measure with the goal of getting him off milrinone, cannot be discharged with HD and milrinone.   - continue home palliative milrinone  -nephro signed off, no plan for HD  - Dr. Ranae Pila following, Appreciate palliative care recs    #Fall, closed head trauma without LOC  -Patient tripped on socks walking to bathroom this am, rapid response called. C-collar for precaution.  -Asymptomatic, CT Head/Neck negative for bleed or fracture. C-spine cleared.   -Monitor neuro exam for changes, nothing further to do for now     # H/o NICM HFrEF w/ EF 20-25% on last ECHO 11/19, secondary to AL amyloid, Followed by Dr. May, whom he saw last week (metolazone discontinued). S/p admission on 12/20-12/28 for volume overload and treated with Bumex gtt. Lactate wnl, Bilirubin elevated likely 2/2  HF  - F/u Advanced HF, apprec recs  - Daily standing weight  - Strict I/Os  - Fluid restrict to 1.5 L/day and Na 2 g/day  - Continue home Bumex 6 mg q 12 h for comfort  - Continue continuous home milrinone infusion 0.375 mcg/kg/min    #H/o AL cardiac amyloidosis - Followed by locally by Dr. Joneen Caraway ([facility]). Previously treated with 10 cycles CyBorD completed in August, In October he received a 4 week course of Revlimid 5 mg daily with dexamethasone, complicated by fluid retention   - F/u Oncology official recs, per yesterday's discussions likely home with hospice care, some discussion regarding initation of Thalidomide/dexa yesterday.       # Vomiting, resolved  - Continue to monitor    # Hyponatremia, likely 2/2 renal failure  - Monitor for clinical changes    # Hypotension  - Continue home midodrine TID    #Global  F -   Current Facility-Administered Medications   Medication   . milrinone     E - replete PRN  N - Diet cardiac  DVT ppx - Heparin subq q12  GI ppx - not indicated  PT/OT - ordered  CODE - NO CPR  -  ALLOW NATURAL DEATH      Safety Checklist:     DVT prophylaxis:  CHEST guideline (See page [id number]) Chemical   Foley:  [place] Rn Foley protocol Not present  IVs:  Peripheral IV   PT/OT: Ordered   Daily CBC & or Chem ordered:  SHM/ABIM guidelines (see #5) Yes, due to clinical and lab instability   Reference for approximate charges of common labs: CBC auto diff - $76  BMP - $99  Mg - $79    Lines:     Patient Lines/Drains/Airways Status    Active PICC Line / CVC Line / PIV Line / Drain / Airway / Intraosseous Line / Epidural Line / ART Line / Line / Wound / Pressure Ulcer / NG/OG Tube     Name:   Placement date:   Placement time:   Site:   Days:    PICC Single Lumen Right Other (Comment)          Other (Comment)        Peripheral IV 07/11/16 Right Forearm  07/11/16    2002    Forearm    less than 1                 Disposition: (Please see PAF column for Expected D/C Date)   Today's date: 07/19/2016  Admit Date:  07/11/2016  8:41 PM  LOS: 8    Subjective   HPI/Subjective: Saw patient early in am due to fall (see event notes). Felt fine after, states he tripped over socks walking to bathroom. Able to get up afterward. No LOC, weakness, vision changes, vomiting.        Physical Exam:     VITAL SIGNS PHYSICAL EXAM   Temp:  [97.4 F (36.3 C)-97.9 F (36.6 C)] 97.6 F (36.4 C)  Heart Rate:  [105-116] 105  Resp Rate:  [18-20] 20  BP: (100-128)/(66-76) 100/70        Intake/Output Summary (Last 24 hours) at 07/19/16 1415  Last data filed at 07/18/16 2200   Gross per 24 hour   Intake                0 ml   Output              150 ml   Net             -150 ml    Physical Exam  General: awake, alert X 3, well appearing, in NAD  Cardiovascular: RRR, 5-2/6 systolic ejection murmur best LLSB, no rubs or gallops  Lungs: clear to auscultation bilaterally, without wheezing, rhonchi, or rales  Abdomen: soft, non-tender, non-distended; no palpable masses,  normoactive bowel sounds  Extremities: trace edema LLE, no edema to RLE, BLE are warm and well perfused       Meds:     Medications were reviewed:  Current Facility-Administered Medications   Medication Dose Route Frequency   . aspirin EC  81 mg Oral Daily   . bumetanide  6 mg Oral Q12H   . heparin (porcine)  5,000 Units Subcutaneous Q12H SCH   . midodrine  10 mg Oral TID MEALS   . zolpidem  5 mg Oral QHS       Labs:     Labs (last 72 hours):      Recent Labs  Lab 07/19/16  0409 07/18/16  0453   WBC 5.86 1.40*   Hgb 12.9* 12.0*   Hematocrit 39.0* 35.8*   Platelets 126* 117*            Recent Labs  Lab 07/19/16  0409 07/18/16  0453   Sodium 129* 127*   Potassium 4.1 4.1   Chloride 87* 90*  CO2 25 23   BUN 111.0* 109.0*   Creatinine 3.6* 3.2*   Calcium 10.3 10.0   Glucose 103* 142*                     Microbiology Results     None            Imaging, reviewed and are significant for:  Ct Head Wo Contrast    Result Date: 07/19/2016  1. No intracranial bleed or acute abnormality identified.  There is no significant change when compared 04/17/2016. 2. White matter hypoattenuation likely due to chronic small vessel ischemic disease. 3. 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 Dierdre Searles, MD 07/19/2016 6:54 AM    Ct Cervical Spine Wo Contrast    Result Date: 07/19/2016  1. No fracture of the cervical spine identified. 2. Prominent degenerative change from C5 to C7. 3. Atherosclerotic calcification and carotid arteries. Dierdre Searles, MD 07/19/2016 7:04 AM    Discussed with Dr. Ivin Booty     Signed by: Karalee Height, DO PGY1

## 2016-07-20 ENCOUNTER — Other Ambulatory Visit: Payer: Self-pay

## 2016-07-20 MED ORDER — POLYETHYLENE GLYCOL 3350 17 G PO PACK
17.0000 g | PACK | Freq: Every day | ORAL | 0 refills | Status: AC | PRN
Start: 2016-07-20 — End: ?
  Filled 2016-07-20: qty 10, 10d supply, fill #0

## 2016-07-20 MED ORDER — HYDROMORPHONE HCL 1 MG/ML PO LIQD
0.5000 mg | ORAL | 0 refills | Status: AC | PRN
Start: 2016-07-20 — End: ?
  Filled 2016-07-20: qty 30, 10d supply, fill #0

## 2016-07-20 MED ORDER — LORAZEPAM 2 MG/ML PO CONC
1.0000 mg | ORAL | 0 refills | Status: AC | PRN
Start: 2016-07-20 — End: ?
  Filled 2016-07-20: qty 30, 10d supply, fill #0

## 2016-07-20 MED ORDER — LORAZEPAM 2 MG/ML IJ SOLN
1.0000 mg | Freq: Once | INTRAMUSCULAR | Status: AC
Start: 2016-07-20 — End: 2016-07-20
  Administered 2016-07-20: 1 mg via INTRAVENOUS
  Filled 2016-07-20: qty 1

## 2016-07-20 NOTE — Progress Notes (Signed)
Case Management Discharge Checklist    PLAN:   [place] to home with [facility] hosp[facility]; wife to transport after 5pm  AVS updated  RN/Wife aware of discharge  No discharge concerns verbalized       07/20/16 1407   Discharge Disposition   Patient preference/choice provided? Yes   Physical Discharge Disposition Home with Needs   Name of [facility]   Name of [facility] [employer])   Mode of Scientist, product/process development   Patient/Family/POA notified of transfer plan Yes   Patient agreeable to discharge plan/expected d/c date? Yes   Family/POA agreeable to discharge plan/expected d/c date? Yes   Bedside nurse notified of transport plan? Yes   Medicare Checklist   Is this a Medicare patient? Yes   Patient received 1st IMM Letter? Yes   3 midnight inpatient qualifying stay (SNF only) No   If LOS 3 days or greater, did patient received 2nd IMM Letter? Yes     Irene Limbo, RN BSN  Case Management  [employer]  [phone]

## 2016-07-20 NOTE — Progress Notes (Signed)
Advanced Heart Failure and Transplant Consult Note    Heart Failure/Transplant [facility] [phone]    Listed for transplant:  No.     Assessment:    71 year old man with AL amyloidosis and nonischemic cardiomyopathy.     Non-ischemic cardiomyopathy, EF 20%, NYHA Class 4, ACC/AHA Stage D due to AL amyloidosis   Acute on chronic systolic heart failure, started on milrinone 0.375 mcg/kg/min 04/2016.   AL Amylodosis, responded to CyBorD (January - August 2016) though relapsed (increased serum free light chains from 35 in October 2017 to 378 in October 2017) and was started on revlimid 5 mg QOD on 04/09/16.   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   Cotninue potassium repletion to maintain K>4 to decrease arrhythmic risk in spite of renal dysfunction   Continue milrinone 0.375 mcg/kg/min for palliation   Continue midodrine 10 mg po TID   Monitor I/O, daily weights   As of 07/13/16: DNR/DNI    Greater than 50% of this encounter and greater than 35 minutes were spent face to face with the patient counseling regarding, but not limited to explaining the current diagnoses, educating the patient on prognosis.    ---------------------------------------------------------------------------------------------------------  Interval Events: After family meeting accepted hospice.    Subjective:  No new symptoms    Patient Active Problem List   Diagnosis   . Ganglion cyst of wrist, right   . Primary localized osteoarthrosis, shoulder region   . Asthma without status asthmaticus   . Acute on chronic combined systolic and diastolic heart failure   . Elevated troponin   . Bilateral lower extremity edema   . Light chain (AL) amyloidosis   . Hyperkalemia   . Chronic  renal failure, stage 3 (moderate)   . Cardiac amyloidosis   . Chronic systolic heart failure   . NICM (nonischemic cardiomyopathy)   . Uremia   . Palliative care by specialist   . Acute renal failure superimposed on chronic kidney disease, unspecified CKD stage, unspecified acute renal failure type     Current Meds:    aspirin EC 81 mg Oral Daily   bumetanide 6 mg Oral Q12H   heparin (porcine) 5,000 Units Subcutaneous Q12H [place]   midodrine 10 mg Oral TID MEALS   zolpidem 10 mg Oral QHS     Drips:  . milrinone 0.375 mcg/kg/min (07/20/16 1134)     He has No Known Allergies.     Family history: Family history is unknown by patient.    Social history:   Social History     Social History   . Marital status: Married     Spouse name: N/A   . Number of children: N/A   . Years of education: N/A     Occupational History   . Not on file.     Social History Main Topics   . Smoking status: Never Smoker   . Smokeless tobacco: Never Used   . Alcohol use Yes      Comment: rare   . Drug use: No   . Sexual activity: No     Other Topics Concern   . Not on file     Social History Narrative   .  No narrative on file     Objective:    Vital signs in last 24 hours:   Temp:  [97.3 F (36.3 C)-98 F (36.7 C)] 98 F (36.7 C)  Heart Rate:  [100-110] 110  Resp Rate:  [16-22] 22  BP: (97-107)/(68-78) 97/69  SpO2: 97 %O2 Device: None (Room air)  Height: 167.6 cm (_0 )  Weight: 56.6 kg (124 lb 11.2 oz); Weight change: -0.68 kg (-1 lb 8 oz)  Body mass index is 20.13 kg/m.[name]    Telemetry: no significant events    I/O last 3 completed shifts:  In: 300 [P.O.:300]  Out: 150 [Urine:150]                     Physical Exam:   General: cachectic, crying, weak appearing, not distressed  HEENT:  Anicteric sclera, JVP >10 cmH2O, no carotid bruits. Carotid pulses are diminished bilaterally  Lungs: Bilateral decreased breath sounds and crackles with decreased effort  Cardiovascular: soft s1 s2, tachycardic rate, regular rhythm, no m/r/g, PMI laterally  displaced  Abdominal: thin, soft, NT/ND, +B.S., no HSM.   Extremities: cool to touch, no cyanosis, 1+ bilateral edema, peripheral pulses are decreased bilaterally.  Neuromuscular exam: grossly non-focal, though not formally tested.    Cardiac Studies:   Echocardiogramon 11/18/17shows a left ventricular end-diastolic dimension of 39RV, concentric LVH (walls 15 mm), global HKwith a left ventricular ejection fraction of 20-25%, and grade 3diastolic dysfunction. The right ventricle is dilated with reduced systolic function. The left atrium isdilated and the right atrium isdilated. The aortic valve is trileaflet withoutevidence of stenosis; there is trivialinsufficiency. The mitral valve is morphologically normal with mildregurgitation. There is mildtricuspid regurgitation. Pulmonary systolic pressure is estimated to be 59mHg above right atrial pressure (10-151mg). There is trivialpulmonic insufficiency. A pericardial effusion is notpresent.    ECG Findingson 05/03/16: sinus rhythm, left anterior fascicular block and inferior MI, low voltage. As compared to hisprevious ECG on 11/1/17there is nono significant change.     Right heart catheterizationon 05/07/16 (prior to milrinone)showed a RA pressure of 8 mmHg, RV pressure of 24/8 mmHg, PA pressure of 29/9/16 mmHg, and a PW pressure of 13 mmHg. PA saturation was 45.8%. Cardiac output by Fick calculation was 1.9 L/min with a corresponding index of 1.2 L/min/m2. Cardiac output by thermodilution was 1.6 L/min with a corresponding index of 0.95 L/min/m2. PVR is 1.6 WU.    CXR 07/11/16: Independently interpreted  No acute process    Lab Results   Component Value Date    WBC 5.86 07/19/2016    HGB 12.9 (L) 07/19/2016    PLT 126 (L) 07/19/2016    NA 129 (L) 07/19/2016    K 4.1 07/19/2016    BUN 111.0 (H) 07/19/2016    CREAT 3.6 (H) 07/19/2016    EGFR 20.5 07/19/2016    MG 2.0 07/15/2016    AST 42 (H) 07/12/2016    ALB 3.8 07/12/2016    INR  1.3 (H) 04/17/2016    BNP 4,994 (H) 06/05/2016

## 2016-07-20 NOTE — Progress Notes (Signed)
Please call me when ready for IV Milrinone connect - Daughter will be driving the patient home tonight    Dominica Severin, RN BSN - [phone]

## 2016-07-20 NOTE — Discharge Summary (Signed)
MEDICINE DISCHARGE SUMMARY    Date Time: 07/20/16 7:11 AM  Patient Name: Gregory [name]  Attending Physician: Gregory Royalty, MD  Primary Care Physician: Gregory Lan, MD    Date of Admission: 07/11/2016  Date of Discharge: 07/20/2016    Discharge Diagnoses:     Principal Diagnosis (Diagnosis after study, that is chiefly responsible for admission to inpatient status): <principal problem not specified>  Active Hospital Problems    Diagnosis POA   . Palliative care by specialist Not Applicable   . Uremia Yes   . Acute renal failure superimposed on chronic kidney disease, unspecified CKD stage, unspecified acute renal failure type Yes   . Chronic systolic heart failure Yes   . Chronic renal failure, stage 3 (moderate) Yes   . Acute on chronic combined systolic and diastolic heart failure Yes      Resolved Hospital Problems    Diagnosis POA   No resolved problems to display.       Disposition:      Home Hospice    Recent Labs - Last 2:         Recent Labs  Lab 07/19/16  0409 07/18/16  0453   WBC 5.86 1.40*   Hgb 12.9* 12.0*   Hematocrit 39.0* 35.8*   Platelets 126* 117*                  Recent Labs  Lab 07/19/16  0409 07/18/16  0453  07/15/16  0355 07/14/16  0503   Sodium 129* 127* More results in Results Review  --  131*   Potassium 4.1 4.1 More results in Results Review  --  3.4*   Chloride 87* 90* More results in Results Review  --  89*   CO2 25 23 More results in Results Review  --  26   BUN 111.0* 109.0* More results in Results Review  --  122.0*   Glucose 103* 142* More results in Results Review  --  101*   Calcium 10.3 10.0 More results in Results Review  --  10.1   Magnesium  --   --   --  2.0 2.2   More results in Results Review = values in this interval not displayed.              Invalid input(s): FREET4         Procedures/Radiology performed:   Radiology: all results in the last 7 days  Ct Head Wo Contrast    Result Date: 07/19/2016  1. No intracranial bleed or acute abnormality identified.  There is no significant change when compared 04/17/2016. 2. White matter hypoattenuation likely due to chronic small vessel ischemic disease. 3. 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 Gregory Searles, MD 07/19/2016 6:54 AM    Ct Cervical Spine Wo Contrast    Result Date: 07/19/2016  1. No fracture of the cervical spine identified. 2. Prominent degenerative change from C5 to C7. 3. Atherosclerotic calcification and carotid arteries. Gregory Searles, MD 07/19/2016 7:04 AM    Surgery: all results from this admission  Procedure(s):  NON-TUNNELED CATH PLACEMENT (Right)     Hospital Course:     Reason for admission/ HPI: Gregory [name] is a 71 y.o. male who presents to the hospital with worsening renal function on outpatient labs. 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 He was recently told to stop metolazone by Gregory [name]. He states that  he had been feeling in his normal state of health until this morning when he noted two episodes of NBNB vomiting, neither of which were associated with nausea. He notes that he has had similar episodes in the past, but never more than once in a day. He otherwise states that he feels fine.     Notes that he has been moving around well at home, relies on extra handrail that was placed for the stairwell, but has had stable functional status. States that his lower extremity edema is chronic and actually looks better than usual.     Per his wife, there are some days where he doesn'[name] take all of his medications - which he denies.     Review of systems is negative for fevers, chills, headache, lightheadedness, dizziness, changes in vision, chest pain, palpitations, dyspnea, wheezing, abdominal pain, nausea, diarrhea, constipation, dysuria, hematuria.     Hospital Course:     #AKI on CKD in setting of AL Amyloidosis, HFrEF  Likely related to progression of Amyloidosis as well as associated cardio-renal syndrome .  Creatinine during admission ranging from 3.1-4.3 (2.3 in November 2017 best Cr in chart). Nephrology was consulted, HD was considered as a temporizing measure with goal of getting patient off milrinone. After several discussions with our consultants, the family, and the patient it was decided that HD would not be an option since we could not wean off milrinone. A formal family meeting was held on 2/1, and following this discussion the family, with support from the oncology team, hospice team, primary medical team, and nephrology, it was decided to forgo further interventions, maintain milrinone for palliation, and discharge to home with home hospice.       # H/o NICM HFrEF w/ EF 20-25% on last ECHO 11/19, secondary to AL amyloid,Followed by Gregory [name], whom he saw last week (metolazone discontinued). S/p admission on12/20-12/28 for volume overload and treated with Bumex gtt.   - Advanced heart failure consulted and involved in conversation about poor prognosis. Recommended to continue Bumex 72m BID, Milrinone 0.375, midodrine 164mTID for volume control and symptom management. No changes made to this home regimen.      #H/o AL cardiacamyloidosis   - Followed by locally by Gregory [name]). Previously treated with 10 cycles CyBorD completed in August, In October he received a 4 week course of Revlimid 5 mg daily with dexamethasone, complicated by fluid retention. Oncology involved during admission, discussion initially directed towards possibleThalidomide/Dexamethasone, however as plan and discussion shifted towards home hospice, no further interventions discussed or planned. Oncology in agreement about poor prognosis, and after discussion with family and patient this was the final decision made prior to discharge. Hospice team discharged with Dilaudid, as well as Lorazepam, for pain and anxiety.       #Fall, closed head trauma without LOC  -Patient tripped on socks walking to bathroom on 2/3, rapid response called.  C-collar for precaution. Asymptomatic, CT Head/Neck negative for bleed or fracture. C-spine cleared. No further falls or neurological changes.      # Hyponatremia, likely 2/2 renal failure, stable throughout admission    # Hypotension  - Continue home midodrine TID    #Global  F -   IV Fluids and Medications       Current Facility-Administered Medications   Medication   . milrinone        E - replete PRN  N - Diet cardiac  DVT ppx - Heparin subq q12  GI ppx -  not indicated  PT/OT - ordered  CODE - NO CPR  -  ALLOW NATURAL DEATH        Discharge Day Exam:  Temp:  [97.3 F (36.3 C)-97.8 F (36.6 C)] 97.4 F (36.3 C)  Heart Rate:  [101-107] 101  Resp Rate:  [16-20] 17  BP: (99-128)/(68-76) 99/71    GENERAL: tired-appearing, NAD, alert, oriented, answers questions appropriately  HENT: Normocephalic, atraumatic, EOMI, MMM  RESPIRATORY: Clear to ausculation bilaterally without wheezes, rhonchi, or rales.  No evidence of tachypnea, dyspnea.  CARDIAC: Regular rate and rhythm, 1/6 SEM LLSB, radial pulses 2+ bilaterally,  ABD/GI: Soft, with active bowel sounds.  Non-tender, non-distended, no guarding or rebound.  No hepatosplenomegaly.  MSK: No joint erythema or swelling.  Able to move all extremities equally and without pain.  NEURO: Awake and alert.  No focal deficits appreciated.    SKIN: Warm, dry without rashes or lesions      Consultations:     Treatment Team:   Attending Provider: Tonye Royalty, MD  Resident: Durene Fruits, MD  Resident: Gershon Crane, MD  Resident: Norina Buzzard, MD  Consulting Physician: Candy Sledge, MD  Consulting Physician: [name], MD  Resident: Mylinda Latina, DO    Discharge Condition:     stable    Discharge Instructions & Follow Up Plan for Patient:     Diet: regular     Activity/Weight Bearing Status: Continue activities as safe and enjoyable, as much as tolerated.             Discharge Code Status: DNR  Patient Emergency Contact: Extended Emergency  Contact Information  Primary Emergency Contact: Veals,[name]  Address: [place], [place] [address] [place]itter of [place] Phone: [phone]  Mobile Phone: [phone]  Relation: Spouse  Secondary Emergency Contact: Babington,Sharonda   [place]  Mobile Phone: [phone]  Relation: Daughter      Complete instructions and follow up are in the patient's After Visit Summary    Minutes spent coordinating discharge and reviewing discharge plan: 45 minutes    Discharge Medications:        Discharge Medication List      Taking    aspirin EC 81 MG EC tablet  Dose:  81 mg  Take 81 mg by mouth daily.     azelastine 0.1 % nasal spray  Dose:  1 spray  Commonly known as:  ASTELIN  1 spray by Nasal route 2 (two) times daily.     bumetanide 2 MG tablet  Dose:  6 mg  Commonly known as:  BUMEX  Take 3 tablets (6 mg total) by mouth 2 (two) times daily.     eszopiclone 2 MG tablet  Dose:  2 mg  Commonly known as:  LUNESTA  Take 2 mg by mouth nightly.     HYDROmorphone HCl 1 MG/ML Liqd  Dose:  0.5 mg  Commonly known as:  DILAUDID  Take 0.5 mLs (0.5 mg total) by mouth every 4 (four) hours as needed (HOSPICE: Shortness of Breath or Pain).     LORazepam 2 MG/ML concentrated solution  Dose:  1 mg  Commonly known as:  ATIVAN  Take 0.5 mLs (1 mg total) by mouth every 4 (four) hours as needed (HOSPICE: Sleep / Insomnia, Seizure, Anxiety).     midodrine 10 MG tablet  Dose:  10 mg  Commonly known  as:  PROAMATINE  Take 1 tablet (10 mg total) by mouth 3 (three) times daily with meals.     milrinone 20-5 MG/100ML-% infusion  Dose:  0.375 mcg/kg/min  Commonly known as:  PRIMACOR  Infuse 23.0625 mcg/min into the vein continuous.     polyethylene glycol packet  Dose:  17 g  Commonly known as:  MIRALAX  Take 17 g by mouth daily as needed (HOSPICE: Bowel Regimen / Constipation).     potassium chloride 20 MEQ tablet  Dose:  20 mEq  What changed:  when to take this  Commonly known as:  K-DUR,KLOR-CON  Take  1 tablet (20 mEq total) by mouth every other day.     spironolactone 50 MG tablet  Dose:  50 mg  Commonly known as:  ALDACTONE  Take 1 tablet (50 mg total) by mouth daily.                Immunizations provided: none         [facility]  Department of Medicine  P: [phone]  F: [phone]    Signed by: Mylinda Latina, MD    CC: [name], [name], MD

## 2016-07-20 NOTE — Progress Notes (Signed)
Pt A&Ox4. SBP soft but stable (milrinone gtt) NSR on tele. Pt on RA with spO2 of between 94-98%. Pt denies SOB, dizziness, numbness, or tingling to extremities . Pt complains of no pain. Fall and safety precautions maintained. Call light and belongings within reach. Hourly rounds completed. I/Os and monitored. Will continue to monitor for changes in pt status.

## 2016-07-20 NOTE — Discharge Instr - AVS First Page (Addendum)
Reason for your Hospital Admission:  Dear Mr. Carlyle, Mcelrath were admitted to the hospital because the labs checked outside of the hospital indicated your kidneys were not working properly. After discussions with your family, the kidney, heart, cancer, and palliative care teams, you and your family made the decision to forgo dialysis and further treatment for your amyloidosis, the likely cause of the kidney and heart problems you have experienced. Home hospice was set-up during the end of your admission here, and your family, along with [facility], are taking you home today to continue the medications you are on for comfort.        Instructions for after your discharge:  Continue home medications as previously prescribed, use your discharge summary medication list as a guide if any questions come up.     New Medications:  Dilaudid prescribed by hospice, 0.82m every 4 hours as needed for pain or shortness of breath  Lorazepam 170mevery 4 hours for anxiety, insomnia    [facility] to request first nurse start of care visit to the home - 24 HR # [phone]

## 2016-07-22 ENCOUNTER — Encounter (INDEPENDENT_AMBULATORY_CARE_PROVIDER_SITE_OTHER): Payer: Self-pay

## 2016-07-22 NOTE — Progress Notes (Signed)
TCM Medicare Navigator    Navigator called the office of Dr. Caleen Jobs @ [phone] and spoke to [place] who reported that the office did receive the pt's most recent d/c summary. Navigator will fax MFN role letter to the office @ [phone]. Navigator then called [place] @ [phone] and spoke to [place]. Sophia transferred the line over to the pt's nurse, Karena Addison. Navigator left [place] a VM detailing MFN role and requesting a call back.     Navigator will continue to manage the pt according to MFN protocol.     [place] [facility] Navigator   [facility]  [address]  [place]  T [phone] F [phone]

## 2016-07-23 ENCOUNTER — Telehealth: Payer: Self-pay

## 2016-07-23 NOTE — Progress Notes (Signed)
Patient discharged.    No 48 hour PCP follow up made due to patient discharge disposition to home with hospice per Discharge Order Info.    Randa Ngo  Case Management Assistant  [phone]

## 2016-07-23 NOTE — Telephone Encounter (Signed)
[name] wife called stating her husband was discharged on Saturday, July 20, 2016 with Hospice care. She was asking what is needed for follow up with Dr. May and Milrinone infusion. I called her back and let her know that it is up to the patient and her if they would like an appointment with Dr. Lu Duffel and that the Hospice team takes over his Milrinone infusion. She stated understanding and will call if the patient wants an appointment, will continue with plan of care.

## 2016-07-24 ENCOUNTER — Telehealth (INDEPENDENT_AMBULATORY_CARE_PROVIDER_SITE_OTHER): Payer: Self-pay

## 2016-07-24 NOTE — Telephone Encounter (Signed)
TCM Medicare Navigator    Navigator called [place] @ [phone] and left a VM for the pt's hospice nurse, [place]. Navigator relayed contact information and hours of operation.     Navigator will continue to manage the pt according to MFN protocol.     [place] Navigator   [facility]  [address]  [place]  T [phone] F [phone]

## 2016-07-29 ENCOUNTER — Encounter (INDEPENDENT_AMBULATORY_CARE_PROVIDER_SITE_OTHER): Payer: Self-pay

## 2016-07-29 NOTE — Progress Notes (Signed)
TCM Medicare Navigator    Completed chart review, patient coded out of CMS Medicare Focus Diagnosis Penalty Population.  Case will be closed with TCM Medicare Navigator services at this time.    [facility] Navigator   [facility]  [address]  [place]  T [phone] F [phone]

## 2016-08-01 ENCOUNTER — Other Ambulatory Visit: Payer: Self-pay

## 2016-08-01 DIAGNOSIS — I5022 Chronic systolic (congestive) heart failure: Secondary | ICD-10-CM

## 2016-08-01 MED ORDER — POTASSIUM CHLORIDE CRYS ER 20 MEQ PO TBCR
20.0000 meq | EXTENDED_RELEASE_TABLET | ORAL | 3 refills | Status: DC
Start: 2016-08-01 — End: 2016-08-02

## 2016-08-02 MED ORDER — POTASSIUM CHLORIDE CRYS ER 20 MEQ PO TBCR
20.0000 meq | EXTENDED_RELEASE_TABLET | ORAL | 3 refills | Status: AC
Start: 2016-08-02 — End: 2016-10-31

## 2016-08-02 NOTE — Addendum Note (Signed)
Addended by: Charlott Holler on: 08/02/2016 01:12 PM     Modules accepted: Orders

## 2016-08-07 ENCOUNTER — Encounter: Payer: Self-pay | Admitting: Acute Care

## 2016-09-15 DEATH — deceased

## 2016-10-02 DIAGNOSIS — N401 Enlarged prostate with lower urinary tract symptoms: Secondary | ICD-10-CM | POA: Diagnosis not present

## 2016-10-09 DIAGNOSIS — R350 Frequency of micturition: Secondary | ICD-10-CM | POA: Diagnosis not present

## 2016-10-09 DIAGNOSIS — R972 Elevated prostate specific antigen [PSA]: Secondary | ICD-10-CM | POA: Diagnosis not present

## 2016-10-09 DIAGNOSIS — N401 Enlarged prostate with lower urinary tract symptoms: Secondary | ICD-10-CM | POA: Diagnosis not present

## 2016-11-05 ENCOUNTER — Encounter: Payer: Self-pay | Admitting: Family Medicine

## 2016-11-05 ENCOUNTER — Ambulatory Visit (INDEPENDENT_AMBULATORY_CARE_PROVIDER_SITE_OTHER): Payer: Medicare Other | Admitting: Family Medicine

## 2016-11-05 VITALS — BP 118/70 | HR 82 | Temp 98.3°F | Resp 14 | Ht 76.0 in | Wt 199.0 lb

## 2016-11-05 DIAGNOSIS — E038 Other specified hypothyroidism: Secondary | ICD-10-CM | POA: Diagnosis not present

## 2016-11-05 DIAGNOSIS — K589 Irritable bowel syndrome without diarrhea: Secondary | ICD-10-CM

## 2016-11-05 DIAGNOSIS — Z0000 Encounter for general adult medical examination without abnormal findings: Secondary | ICD-10-CM | POA: Diagnosis not present

## 2016-11-05 DIAGNOSIS — Z110 Encounter for screening for intestinal infectious diseases: Secondary | ICD-10-CM | POA: Diagnosis not present

## 2016-11-05 DIAGNOSIS — Z13220 Encounter for screening for lipoid disorders: Secondary | ICD-10-CM | POA: Diagnosis not present

## 2016-11-05 MED ORDER — ALPRAZOLAM 0.25 MG PO TABS: 0.2500 mg | ORAL_TABLET | Freq: Every day | ORAL | 1 refills | Status: DC | PRN

## 2016-11-05 NOTE — Progress Notes (Signed)
Subjective:    Patient ID: Carlos Choi, male    DOB: [birth year], 71 y.o.   MRN: [id number]  HPI  Patient is here today for CPE.. Last colonoscopy was in 2015 and was normal. He sees a urologist who performs a prostate exam and checks of PSA every year.  PSA this year was 1.76.  [name] He had Pneumovax 23 in 8/17.  He is due for Prevnar 13 and shingrix. He is also due for hepatitis C screening but politely declines.  Does request a refill on xanax.  He uses sparingly for anxiety which exacerbates his ibs.  He may take 1 pill a week as needed. Past Medical History:  Diagnosis Date  . Allergy   . Hemorrhoids   . Hypothyroid   . Rectal bleeding    due to hemorrhoid    Past Surgical History:  Procedure Laterality Date  . HERNIA REPAIR     Over ten years ago per patient. He was not sure of the date.  [name] ROTATOR CUFF REPAIR     both shoulders - patient does not remember the exact date   Current Outpatient Prescriptions on File Prior to Visit  Medication Sig Dispense Refill  . ALPRAZolam (XANAX) 0.25 MG tablet Take 1 tablet (0.25 mg total) by mouth daily as needed for anxiety. 90 tablet 1  . aspirin 81 MG tablet Take 81 mg by mouth every other day.      . levothyroxine (SYNTHROID, LEVOTHROID) 100 MCG tablet Take 1 tablet (100 mcg total) by mouth daily. 90 tablet 3  . loratadine (CLARITIN) 10 MG tablet Take 10 mg by mouth daily.    . Multiple Vitamins-Minerals (CENTRUM SILVER PO) Take 1 tablet by mouth daily.     . Omega-3 Fatty Acids (FISH OIL) 1000 MG CAPS Take 3 capsules by mouth daily.      No current facility-administered medications on file prior to visit.    No Known Allergies Social History   Social History  . Marital status: Married    Spouse name: N/A  . Number of children: N/A  . Years of education: N/A   Occupational History  . Not on file.   Social History Main Topics  . Smoking status: Former Smoker    Quit date: 06/13/1971  . Smokeless tobacco: Never Used  .  Alcohol use Yes     Comment: 2 beers Monthly.  . Drug use: No  . Sexual activity: Not on file   Other Topics Concern  . Not on file   Social History Narrative  . No narrative on file   Family History  Problem Relation Age of Onset  . Aneurysm Mother   . Alzheimer's disease Father   . Cancer Sister        ovarian  . Stroke Brother   . Hypertension Brother   . Colon cancer Neg Hx   . Esophageal cancer Neg Hx   . Stomach cancer Neg Hx   . Rectal cancer Neg Hx      Review of Systems  All other systems reviewed and are negative.      Objective:   Physical Exam  Constitutional: He is oriented to person, place, and time. He appears well-developed and well-nourished. No distress.  HENT:  Head: Normocephalic and atraumatic.  Right Ear: External ear normal.  Left Ear: External ear normal.  Nose: Nose normal.  Mouth/Throat: Oropharynx is clear and moist. No oropharyngeal exudate.  Eyes: Conjunctivae and EOM are normal. Pupils are  equal, round, and reactive to light. Right eye exhibits no discharge. Left eye exhibits no discharge. No scleral icterus.  Neck: Normal range of motion. Neck supple. No JVD present. No thyromegaly present.  Cardiovascular: Normal rate, regular rhythm and normal heart sounds.  Exam reveals no gallop and no friction rub.   No murmur heard. Pulmonary/Chest: Effort normal and breath sounds normal. No stridor. No respiratory distress. He has no wheezes. He has no rales. He exhibits no tenderness.  Abdominal: Soft. Bowel sounds are normal. He exhibits no distension and no mass. There is no tenderness. There is no rebound and no guarding.  Musculoskeletal: Normal range of motion. He exhibits no edema or tenderness.  Lymphadenopathy:    He has no cervical adenopathy.  Neurological: He is alert and oriented to person, place, and time. He has normal reflexes. No cranial nerve deficit. He exhibits normal muscle tone. Coordination normal.  Skin: Skin is warm. No  rash noted. He is not diaphoretic. No erythema. No pallor.  Psychiatric: He has a normal mood and affect. His behavior is normal. Judgment and thought content normal.  Vitals reviewed.         Assessment & Plan:  Annual physical exam - Plan: CBC with Differential/Platelet, COMPLETE METABOLIC PANEL WITH GFR  Other specified hypothyroidism - Plan: CBC with Differential/Platelet, COMPLETE METABOLIC PANEL WITH GFR, TSH  Cholera screening  Screening cholesterol level - Plan: Lipid panel  Irritable bowel syndrome, unspecified type - Plan: ALPRAZolam (XANAX) 0.25 MG tablet  Ignore cholera screening. This was an error on entry. I would like the patient to return fasting for a CBC, CMP, fasting lipid panel. I would monitor the treatment of his hypothyroidism by checking a TSH. Will discuss Prevnar 13 and he would like to get this in the fall. We also discussed the shingles vaccine but he will check on the price first. Colonoscopy is up-to-date. Recently saw his urologist rechecked his PSA and was within normal limits. I did refill his Xanax 0.25 mg tablets. He takes 1 as needed on occasion. He usually takes 1 tablet a week. 90 pills should last more than a year. We discussed the risk of habituation with repeated use but he uses it sparingly.

## 2016-11-06 ENCOUNTER — Other Ambulatory Visit: Payer: Medicare Other

## 2016-11-06 DIAGNOSIS — E038 Other specified hypothyroidism: Secondary | ICD-10-CM | POA: Diagnosis not present

## 2016-11-06 DIAGNOSIS — Z13220 Encounter for screening for lipoid disorders: Secondary | ICD-10-CM | POA: Diagnosis not present

## 2016-11-06 DIAGNOSIS — Z0000 Encounter for general adult medical examination without abnormal findings: Secondary | ICD-10-CM | POA: Diagnosis not present

## 2016-11-06 LAB — COMPLETE METABOLIC PANEL WITH GFR
ALT: 10 U/L (ref 9–46)
AST: 16 U/L (ref 10–35)
Albumin: 4.1 g/dL (ref 3.6–5.1)
Alkaline Phosphatase: 63 U/L (ref 40–115)
BILIRUBIN TOTAL: 1 mg/dL (ref 0.2–1.2)
BUN: 18 mg/dL (ref 7–25)
CO2: 22 mmol/L (ref 20–31)
Calcium: 9 mg/dL (ref 8.6–10.3)
Chloride: 107 mmol/L (ref 98–110)
Creat: 1.03 mg/dL (ref 0.70–1.25)
GFR, EST AFRICAN AMERICAN: 85 mL/min (ref >=60)
GFR, Est Non African American: 74 mL/min (ref >=60)
GLUCOSE: 97 mg/dL (ref 70–99)
Potassium: 4.5 mmol/L (ref 3.5–5.3)
Sodium: 141 mmol/L (ref 135–146)
TOTAL PROTEIN: 6.7 g/dL (ref 6.1–8.1)

## 2016-11-06 LAB — LIPID PANEL
Cholesterol: 197 mg/dL (ref <200)
HDL: 40 mg/dL — ABNORMAL LOW (ref >40)
LDL CALC: 135 mg/dL — AB (ref <100)
TRIGLYCERIDES: 110 mg/dL (ref <150)
Total CHOL/HDL Ratio: 4.9 Ratio (ref <5.0)
VLDL: 22 mg/dL (ref <30)

## 2016-11-06 LAB — CBC WITH DIFFERENTIAL/PLATELET
BASOS PCT: 1 %
Basophils Absolute: 51 cells/uL (ref 0–200)
EOS ABS: 51 {cells}/uL (ref 15–500)
Eosinophils Relative: 1 %
HCT: 48.2 % (ref 38.5–50.0)
Hemoglobin: 16.4 g/dL (ref 13.0–17.0)
Lymphocytes Relative: 29 %
Lymphs Abs: 1479 cells/uL (ref 850–3900)
MCH: 30.9 pg (ref 27.0–33.0)
MCHC: 34 g/dL (ref 32.0–36.0)
MCV: 90.8 fL (ref 80.0–100.0)
MONO ABS: 459 {cells}/uL (ref 200–950)
MONOS PCT: 9 %
MPV: 11.2 fL (ref 7.5–12.5)
Neutro Abs: 3060 cells/uL (ref 1500–7800)
Neutrophils Relative %: 60 %
PLATELETS: 180 10*3/uL (ref 140–400)
RBC: 5.31 MIL/uL (ref 4.20–5.80)
RDW: 13.7 % (ref 11.0–15.0)
WBC: 5.1 10*3/uL (ref 3.8–10.8)

## 2016-11-07 ENCOUNTER — Other Ambulatory Visit: Payer: Self-pay | Admitting: Family Medicine

## 2016-11-07 DIAGNOSIS — E038 Other specified hypothyroidism: Secondary | ICD-10-CM

## 2016-11-07 LAB — TSH: TSH: 0.69 m[IU]/L (ref 0.40–4.50)

## 2016-11-07 MED ORDER — LEVOTHYROXINE SODIUM 100 MCG PO TABS: 100.0000 ug | ORAL_TABLET | Freq: Every day | ORAL | 3 refills | Status: DC

## 2016-11-08 ENCOUNTER — Encounter: Payer: Self-pay | Admitting: Family Medicine

## 2016-11-20 IMAGING — CR DG ABDOMEN 1V
1 series · 1 of 1 positions shown · non-contrast
Comparison: None.

CLINICAL DATA: Intermittent right flank pain, some time severe

EXAM:
ABDOMEN - 1 VIEW

[AP]
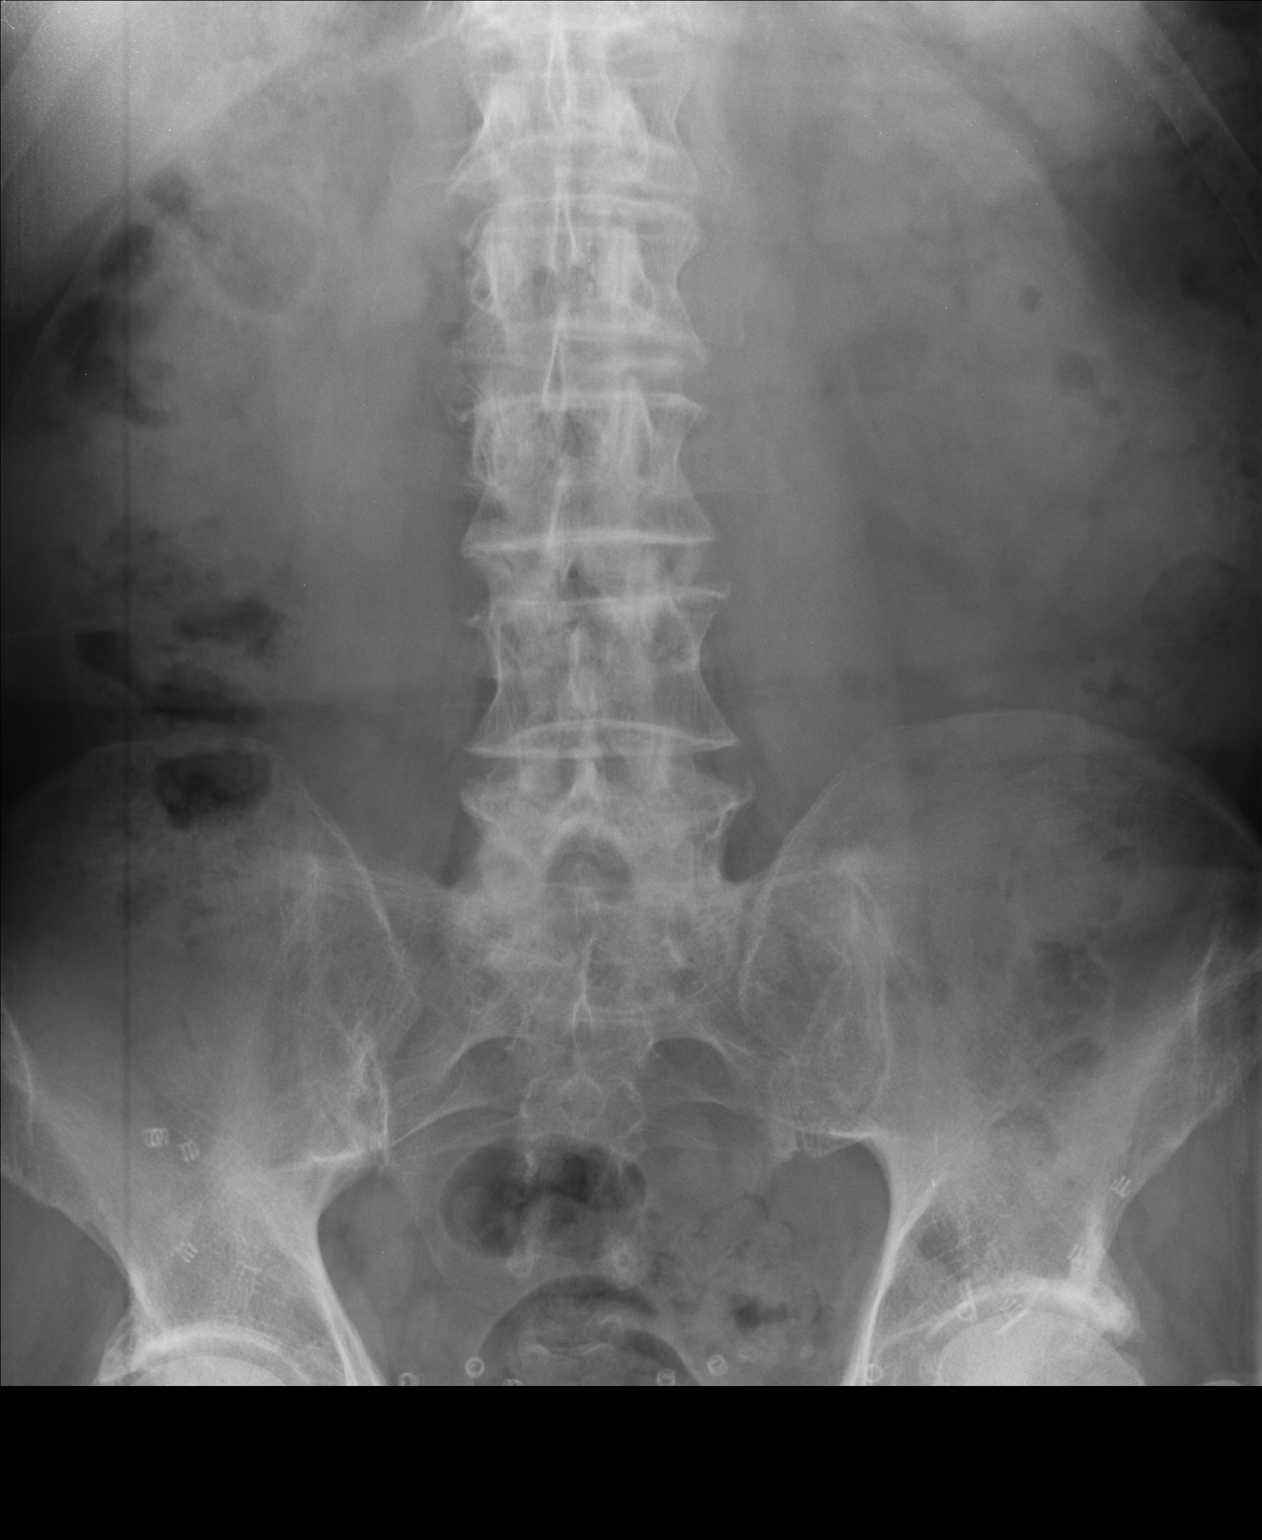

[1 of 1 positions shown; findings below may reference images not displayed]

FINDINGS: A supine film of the abdomen shows a nonspecific bowel gas pattern.
No bowel obstruction is seen. No opaque calculi are seen overlying
the kidneys or the expected courses of the ureters. Surgical mesh is
noted in the lower mid pelvis. There are degenerative changes in the
hips.
IMPRESSION: No bowel obstruction.

## 2016-12-11 ENCOUNTER — Encounter: Payer: Self-pay | Admitting: Physician Assistant

## 2016-12-11 ENCOUNTER — Ambulatory Visit (INDEPENDENT_AMBULATORY_CARE_PROVIDER_SITE_OTHER): Payer: Medicare Other | Admitting: Physician Assistant

## 2016-12-11 VITALS — BP 142/80 | HR 80 | Temp 97.7°F | Resp 16 | Wt 196.2 lb

## 2016-12-11 DIAGNOSIS — J988 Other specified respiratory disorders: Secondary | ICD-10-CM | POA: Diagnosis not present

## 2016-12-11 DIAGNOSIS — B9689 Other specified bacterial agents as the cause of diseases classified elsewhere: Principal | ICD-10-CM

## 2016-12-11 MED ORDER — AZITHROMYCIN 250 MG PO TABS: ORAL_TABLET | ORAL | 0 refills | Status: DC

## 2016-12-11 NOTE — Progress Notes (Signed)
    Patient ID: Carlos Choi MRN: [id number], DOB: [birth year], 71 y.o. Date of Encounter: 12/11/2016, 10:28 AM    Chief Complaint:  Chief Complaint  Patient presents with  . chest congestion    x1wk  . Cough  . chest burning     HPI: 71 y.o. year old male presents with above.   He reports that last Thursday he developed cough. Started using Robitussin and some other mucus medication. He was a little better a few days later but then since then has been feeling worse. Yesterday was feeling even worse and this morning had increased amount of cough and phlegm so decided to come on in as he had been dealing this will for full week and it is only getting worse. Has had very little nasal congestion and has blown very little out of his nose. No significant sore throat. No fevers or chills.     Home Meds:   Outpatient Medications Prior to Visit  Medication Sig Dispense Refill  . ALPRAZolam (XANAX) 0.25 MG tablet Take 1 tablet (0.25 mg total) by mouth daily as needed for anxiety. 90 tablet 1  . aspirin 81 MG tablet Take 81 mg by mouth every other day.      . Lactobacillus-Inulin (Morton PO) Take by mouth.    . levothyroxine (SYNTHROID, LEVOTHROID) 100 MCG tablet Take 1 tablet (100 mcg total) by mouth daily. 90 tablet 3  . loratadine (CLARITIN) 10 MG tablet Take 10 mg by mouth daily.    . Multiple Vitamins-Minerals (CENTRUM SILVER PO) Take 1 tablet by mouth daily.     . Omega-3 Fatty Acids (FISH OIL) 1000 MG CAPS Take 3 capsules by mouth daily.      No facility-administered medications prior to visit.     Allergies: No Known Allergies    Review of Systems: See HPI for pertinent ROS. All other ROS negative.    Physical Exam: Blood pressure (!) 142/80, pulse 80, temperature 97.7 F (36.5 C), temperature source Oral, resp. rate 16, weight 196 lb 3.2 oz (89 kg), SpO2 98 %., Body mass index is 23.88 kg/m. General: WNWD WM.  Appears in no acute distress. HEENT:  Normocephalic, atraumatic, eyes without discharge, sclera non-icteric, nares are without discharge. Bilateral auditory canals clear, TM's are without perforation, pearly grey and translucent with reflective cone of light bilaterally. Oral cavity moist, posterior pharynx without exudate, erythema, peritonsillar abscess. No tenderness with percussion to frontal or maxillary sinuses bilaterally.  Neck: Supple. No thyromegaly. No lymphadenopathy. Lungs: Clear bilaterally to auscultation without wheezes, rales, or rhonchi. Breathing is unlabored. Heart: Regular rhythm. No murmurs, rubs, or gallops. Msk:  Strength and tone normal for age. Extremities/Skin: Warm and dry. Neuro: Alert and oriented X 3. Moves all extremities spontaneously. Gait is normal. CNII-XII grossly in tact. Psych:  Responds to questions appropriately with a normal affect.     ASSESSMENT AND PLAN:  71 y.o. year old male with  1. Bacterial respiratory infection He is to take antibiotic as directed. Follow-up if symptoms do not resolve within 1 week after completion of antibiotic. He can continue his over-the-counter medications for symptom relief if needed. - azithromycin (ZITHROMAX) 250 MG tablet; Day 1: Take 2 daily. Days 2 - 5: Take 1 daily.  Dispense: 6 tablet; Refill: 0   Signed, [address] [place], [place], [facility] 12/11/2016 10:28 AM

## 2016-12-17 ENCOUNTER — Telehealth: Payer: Self-pay

## 2016-12-17 MED ORDER — LEVOFLOXACIN 750 MG PO TABS: 750.0000 mg | ORAL_TABLET | Freq: Every day | ORAL | 0 refills | Status: DC

## 2016-12-17 NOTE — Telephone Encounter (Signed)
Rx sent to pharmacy.  Patient is aware. 

## 2016-12-17 NOTE — Telephone Encounter (Signed)
Patient was seen in office on 6/27 for bacterial respiratory infection. Patient states he is feeling better however he is still congested and is requesting another round of antibiotic.  Pls advise

## 2016-12-17 NOTE — Telephone Encounter (Signed)
Levaquin 750mg 1 po QD x 7 days. # 7 + 0. 

## 2016-12-26 ENCOUNTER — Encounter (INDEPENDENT_AMBULATORY_CARE_PROVIDER_SITE_OTHER): Payer: Self-pay

## 2017-01-09 DIAGNOSIS — N529 Male erectile dysfunction, unspecified: Secondary | ICD-10-CM | POA: Diagnosis not present

## 2017-01-09 DIAGNOSIS — E7800 Pure hypercholesterolemia, unspecified: Secondary | ICD-10-CM | POA: Diagnosis not present

## 2017-01-09 DIAGNOSIS — I10 Essential (primary) hypertension: Secondary | ICD-10-CM | POA: Diagnosis not present

## 2017-01-20 DIAGNOSIS — D692 Other nonthrombocytopenic purpura: Secondary | ICD-10-CM | POA: Diagnosis not present

## 2017-01-20 DIAGNOSIS — L570 Actinic keratosis: Secondary | ICD-10-CM | POA: Diagnosis not present

## 2017-01-20 DIAGNOSIS — D1801 Hemangioma of skin and subcutaneous tissue: Secondary | ICD-10-CM | POA: Diagnosis not present

## 2017-01-20 DIAGNOSIS — L821 Other seborrheic keratosis: Secondary | ICD-10-CM | POA: Diagnosis not present

## 2017-01-23 ENCOUNTER — Encounter (INDEPENDENT_AMBULATORY_CARE_PROVIDER_SITE_OTHER): Payer: Self-pay

## 2017-02-23 ENCOUNTER — Encounter (INDEPENDENT_AMBULATORY_CARE_PROVIDER_SITE_OTHER): Payer: Self-pay

## 2017-04-02 ENCOUNTER — Ambulatory Visit (INDEPENDENT_AMBULATORY_CARE_PROVIDER_SITE_OTHER): Payer: Medicare Other | Admitting: Family Medicine

## 2017-04-02 DIAGNOSIS — Z23 Encounter for immunization: Secondary | ICD-10-CM

## 2017-04-09 DIAGNOSIS — Z23 Encounter for immunization: Secondary | ICD-10-CM | POA: Diagnosis not present

## 2017-05-06 DIAGNOSIS — H2513 Age-related nuclear cataract, bilateral: Secondary | ICD-10-CM | POA: Diagnosis not present

## 2017-06-02 ENCOUNTER — Ambulatory Visit (INDEPENDENT_AMBULATORY_CARE_PROVIDER_SITE_OTHER): Payer: Medicare Other | Admitting: Family Medicine

## 2017-06-02 DIAGNOSIS — Z23 Encounter for immunization: Secondary | ICD-10-CM

## 2017-06-27 DIAGNOSIS — H25013 Cortical age-related cataract, bilateral: Secondary | ICD-10-CM | POA: Diagnosis not present

## 2017-06-27 DIAGNOSIS — H2513 Age-related nuclear cataract, bilateral: Secondary | ICD-10-CM | POA: Diagnosis not present

## 2017-07-15 DIAGNOSIS — Z125 Encounter for screening for malignant neoplasm of prostate: Secondary | ICD-10-CM | POA: Diagnosis not present

## 2017-07-15 DIAGNOSIS — Z0000 Encounter for general adult medical examination without abnormal findings: Secondary | ICD-10-CM | POA: Diagnosis not present

## 2017-07-15 DIAGNOSIS — I10 Essential (primary) hypertension: Secondary | ICD-10-CM | POA: Diagnosis not present

## 2017-07-15 DIAGNOSIS — N529 Male erectile dysfunction, unspecified: Secondary | ICD-10-CM | POA: Diagnosis not present

## 2017-07-15 DIAGNOSIS — E7800 Pure hypercholesterolemia, unspecified: Secondary | ICD-10-CM | POA: Diagnosis not present

## 2017-07-15 DIAGNOSIS — N433 Hydrocele, unspecified: Secondary | ICD-10-CM | POA: Diagnosis not present

## 2017-07-15 DIAGNOSIS — K219 Gastro-esophageal reflux disease without esophagitis: Secondary | ICD-10-CM | POA: Diagnosis not present

## 2017-08-26 DIAGNOSIS — H25812 Combined forms of age-related cataract, left eye: Secondary | ICD-10-CM | POA: Diagnosis not present

## 2017-08-26 DIAGNOSIS — H2512 Age-related nuclear cataract, left eye: Secondary | ICD-10-CM | POA: Diagnosis not present

## 2017-08-26 DIAGNOSIS — H25012 Cortical age-related cataract, left eye: Secondary | ICD-10-CM | POA: Diagnosis not present

## 2017-09-01 DIAGNOSIS — J209 Acute bronchitis, unspecified: Secondary | ICD-10-CM | POA: Diagnosis not present

## 2017-10-17 DIAGNOSIS — N401 Enlarged prostate with lower urinary tract symptoms: Secondary | ICD-10-CM | POA: Diagnosis not present

## 2017-10-22 DIAGNOSIS — N401 Enlarged prostate with lower urinary tract symptoms: Secondary | ICD-10-CM | POA: Diagnosis not present

## 2017-10-22 DIAGNOSIS — R972 Elevated prostate specific antigen [PSA]: Secondary | ICD-10-CM | POA: Diagnosis not present

## 2017-10-22 DIAGNOSIS — R350 Frequency of micturition: Secondary | ICD-10-CM | POA: Diagnosis not present

## 2017-10-31 ENCOUNTER — Telehealth: Payer: Self-pay

## 2017-10-31 NOTE — Telephone Encounter (Signed)
Patient called to schedule an appointment to be seen for sinus drainage. Patient was advised that there were no openings left and that he could  use otc mucinex as well as nasal saline. Patient was also instructed to go to urgent care or [facility] if he felt like he could not wait until 5/20 to see his PCP . Patient denies having a headache, patient denies having a fever,Patient denies having a cough associated with the drainage

## 2017-11-11 ENCOUNTER — Other Ambulatory Visit: Payer: Medicare Other

## 2017-11-11 ENCOUNTER — Telehealth: Payer: Self-pay | Admitting: Family Medicine

## 2017-11-11 DIAGNOSIS — Z0000 Encounter for general adult medical examination without abnormal findings: Secondary | ICD-10-CM

## 2017-11-11 NOTE — Telephone Encounter (Signed)
I am fine adding it, but their would have to be a reason for insurance to cover it (I.e. Fatigue, etc).  Medicare does not cover screening for testosterone deficiency.

## 2017-11-11 NOTE — Telephone Encounter (Signed)
Spoke with patient and patient stated that he is not having any issues and friend has mentioned to him that you can have it checked. He stated that he would like to hold off on having it drawn at this time.

## 2017-11-11 NOTE — Telephone Encounter (Signed)
Patient came in today for blood draw and requested that we add a testosterone. May we add testosterone?  Please advise?

## 2017-11-12 LAB — CBC WITH DIFFERENTIAL/PLATELET
Basophils Absolute: 57 {cells}/uL (ref 0–200)
Basophils Relative: 1 %
Eosinophils Absolute: 40 {cells}/uL (ref 15–500)
Eosinophils Relative: 0.7 %
HCT: 48.1 % (ref 38.5–50.0)
Hemoglobin: 16.7 g/dL (ref 13.2–17.1)
Lymphs Abs: 1539 {cells}/uL (ref 850–3900)
MCH: 30.9 pg (ref 27.0–33.0)
MCHC: 34.7 g/dL (ref 32.0–36.0)
MCV: 88.9 fL (ref 80.0–100.0)
MPV: 11.7 fL (ref 7.5–12.5)
Monocytes Relative: 8.2 %
Neutro Abs: 3597 {cells}/uL (ref 1500–7800)
Neutrophils Relative %: 63.1 %
Platelets: 177 Thousand/uL (ref 140–400)
RBC: 5.41 Million/uL (ref 4.20–5.80)
RDW: 13 % (ref 11.0–15.0)
Total Lymphocyte: 27 %
WBC mixed population: 467 {cells}/uL (ref 200–950)
WBC: 5.7 Thousand/uL (ref 3.8–10.8)

## 2017-11-12 LAB — COMPREHENSIVE METABOLIC PANEL
AG Ratio: 1.8 (calc) (ref 1.0–2.5)
ALBUMIN MSPROF: 4.5 g/dL (ref 3.6–5.1)
ALKALINE PHOSPHATASE (APISO): 70 U/L (ref 40–115)
ALT: 10 U/L (ref 9–46)
AST: 16 U/L (ref 10–35)
BUN: 16 mg/dL (ref 7–25)
CO2: 26 mmol/L (ref 20–32)
CREATININE: 0.97 mg/dL (ref 0.70–1.18)
Calcium: 9.3 mg/dL (ref 8.6–10.3)
Chloride: 107 mmol/L (ref 98–110)
Globulin: 2.5 g/dL (calc) (ref 1.9–3.7)
Glucose, Bld: 108 mg/dL — ABNORMAL HIGH (ref 65–99)
POTASSIUM: 4.7 mmol/L (ref 3.5–5.3)
SODIUM: 141 mmol/L (ref 135–146)
TOTAL PROTEIN: 7 g/dL (ref 6.1–8.1)
Total Bilirubin: 1.1 mg/dL (ref 0.2–1.2)

## 2017-11-12 LAB — LIPID PANEL
CHOL/HDL RATIO: 4.6 (calc) (ref <5.0)
Cholesterol: 199 mg/dL (ref <200)
HDL: 43 mg/dL (ref >40)
LDL CHOLESTEROL (CALC): 131 mg/dL — AB
NON-HDL CHOLESTEROL (CALC): 156 mg/dL — AB (ref <130)
Triglycerides: 139 mg/dL (ref <150)

## 2017-11-12 LAB — TSH: TSH: 0.88 m[IU]/L (ref 0.40–4.50)

## 2017-11-14 ENCOUNTER — Ambulatory Visit (INDEPENDENT_AMBULATORY_CARE_PROVIDER_SITE_OTHER): Payer: Medicare Other | Admitting: Family Medicine

## 2017-11-14 ENCOUNTER — Encounter: Payer: Self-pay | Admitting: Family Medicine

## 2017-11-14 VITALS — BP 112/60 | HR 84 | Temp 98.3°F | Resp 16 | Ht 76.0 in | Wt 195.0 lb

## 2017-11-14 DIAGNOSIS — Z0000 Encounter for general adult medical examination without abnormal findings: Secondary | ICD-10-CM | POA: Diagnosis not present

## 2017-11-14 DIAGNOSIS — Z13220 Encounter for screening for lipoid disorders: Secondary | ICD-10-CM

## 2017-11-14 DIAGNOSIS — K589 Irritable bowel syndrome without diarrhea: Secondary | ICD-10-CM | POA: Diagnosis not present

## 2017-11-14 DIAGNOSIS — E038 Other specified hypothyroidism: Secondary | ICD-10-CM | POA: Diagnosis not present

## 2017-11-14 MED ORDER — ALPRAZOLAM 0.25 MG PO TABS: 0.2500 mg | ORAL_TABLET | Freq: Every day | ORAL | 0 refills | Status: DC | PRN

## 2017-11-14 NOTE — Progress Notes (Signed)
Subjective:    Patient ID: Carlos Choi, male    DOB: 06-10-1946, 72 y.o.   MRN: [id number]  HPI  Patient is here today for CPE.  Last colonoscopy was in 2015 and was normal. He sees a urologist who performs a prostate exam and checks of PSA every year.  Does request a refill on xanax.  He uses sparingly for anxiety which exacerbates his ibs.  He may take 1 pill a week as needed.  I gave the patient 90 pills last year at his physical and he still has 10 remaining.  Given the frequency that he uses the Xanax, I see no risk and I will gladly refill them.  During his review of systems, he does report postnasal drainage and congestion in his throat that has gone on now for 6 to 8 weeks.  Occasionally he will "hock up" sputum tinged with just a streak trace amount of blood.  He started Claritin for postnasal drip and drainage and has not seen blood in more than 4 weeks but he continues to have postnasal drip and drainage.  He saw the streak of blood 1 or 2 times and again it was very small like a small piece of string.  His most recent lab work as listed below: Lab on 11/11/2017  Component Date Value Ref Range Status  . TSH 11/11/2017 0.88  0.40 - 4.50 mIU/L Final  . Cholesterol 11/11/2017 199  <200 mg/dL Final  . HDL 11/11/2017 43  >40 mg/dL Final  . Triglycerides 11/11/2017 139  <150 mg/dL Final  . LDL Cholesterol (Calc) 11/11/2017 131* mg/dL (calc) Final   Comment: Reference range: <100 . Desirable range <100 mg/dL for primary prevention;   <70 mg/dL for patients with CHD or diabetic patients  with > or = 2 CHD risk factors. [name] LDL-C is now calculated using the Martin-Hopkins  calculation, which is a validated novel method providing  better accuracy than the Friedewald equation in the  estimation of LDL-C.  Cresenciano Genre et al. Annamaria Helling. 2202;542(70): 2061-2068  (http://education.[facility].com/faq/FAQ164)   . Total CHOL/HDL Ratio 11/11/2017 4.6  <5.0 (calc) Final  . Non-HDL Cholesterol  (Calc) 11/11/2017 156* <130 mg/dL (calc) Final   Comment: For patients with diabetes plus 1 major ASCVD risk  factor, treating to a non-HDL-C goal of <100 mg/dL  (LDL-C of <70 mg/dL) is considered a therapeutic  option.   . Glucose, Bld 11/11/2017 108* 65 - 99 mg/dL Final   Comment: .            Fasting reference interval . For someone without known diabetes, a glucose value between 100 and 125 mg/dL is consistent with prediabetes and should be confirmed with a follow-up test. .   . BUN 11/11/2017 16  7 - 25 mg/dL Final  . Creat 11/11/2017 0.97  0.70 - 1.18 mg/dL Final   Comment: For patients >80 years of age, the reference limit for Creatinine is approximately 13% higher for people identified as African-American. .   Havery Moros Ratio 62/37/6283 NOT APPLICABLE  6 - 22 (calc) Final  . Sodium 11/11/2017 141  135 - 146 mmol/L Final  . Potassium 11/11/2017 4.7  3.5 - 5.3 mmol/L Final  . Chloride 11/11/2017 107  98 - 110 mmol/L Final  . CO2 11/11/2017 26  20 - 32 mmol/L Final  . Calcium 11/11/2017 9.3  8.6 - 10.3 mg/dL Final  . Total Protein 11/11/2017 7.0  6.1 - 8.1 g/dL Final  . Albumin 11/11/2017 4.5  3.6 - 5.1 g/dL Final  . Globulin 11/11/2017 2.5  1.9 - 3.7 g/dL (calc) Final  . AG Ratio 11/11/2017 1.8  1.0 - 2.5 (calc) Final  . Total Bilirubin 11/11/2017 1.1  0.2 - 1.2 mg/dL Final  . Alkaline phosphatase (APISO) 11/11/2017 70  40 - 115 U/L Final  . AST 11/11/2017 16  10 - 35 U/L Final  . ALT 11/11/2017 10  9 - 46 U/L Final  . WBC 11/11/2017 5.7  3.8 - 10.8 Thousand/uL Final  . RBC 11/11/2017 5.41  4.20 - 5.80 Million/uL Final  . Hemoglobin 11/11/2017 16.7  13.2 - 17.1 g/dL Final  . HCT 11/11/2017 48.1  38.5 - 50.0 % Final  . MCV 11/11/2017 88.9  80.0 - 100.0 fL Final  . MCH 11/11/2017 30.9  27.0 - 33.0 pg Final  . MCHC 11/11/2017 34.7  32.0 - 36.0 g/dL Final  . RDW 11/11/2017 13.0  11.0 - 15.0 % Final  . Platelets 11/11/2017 177  140 - 400 Thousand/uL Final  . MPV  11/11/2017 11.7  7.5 - 12.5 fL Final  . Neutro Abs 11/11/2017 3,597  1,500 - 7,800 cells/uL Final  . Lymphs Abs 11/11/2017 1,539  850 - 3,900 cells/uL Final  . WBC mixed population 11/11/2017 467  200 - 950 cells/uL Final  . Eosinophils Absolute 11/11/2017 40  15 - 500 cells/uL Final  . Basophils Absolute 11/11/2017 57  0 - 200 cells/uL Final  . Neutrophils Relative % 11/11/2017 63.1  % Final  . Total Lymphocyte 11/11/2017 27.0  % Final  . Monocytes Relative 11/11/2017 8.2  % Final  . Eosinophils Relative 11/11/2017 0.7  % Final  . Basophils Relative 11/11/2017 1.0  % Final   Immunization History  Administered Date(s) Administered  . Influenza, High Dose Seasonal PF 04/02/2017  . Influenza,inj,Quad PF,6+ Mos 04/06/2013, 04/06/2014, 04/07/2015, 03/29/2016  . Influenza-Unspecified 03/17/2012  . Pneumococcal Conjugate-13 04/02/2017  . Pneumococcal Polysaccharide-23 01/23/2016  . Tdap 06/17/2009  . Zoster Recombinat (Shingrix) 04/02/2017, 06/02/2017    Past Medical History:  Diagnosis Date  . Allergy   . Hemorrhoids   . Hypothyroid   . Rectal bleeding    due to hemorrhoid    Past Surgical History:  Procedure Laterality Date  . HERNIA REPAIR     Over ten years ago per patient. He was not sure of the date.  [name] ROTATOR CUFF REPAIR     both shoulders - patient does not remember the exact date   Current Outpatient Medications on File Prior to Visit  Medication Sig Dispense Refill  . aspirin 81 MG tablet Take 81 mg by mouth every other day.      . Lactobacillus-Inulin ([place] PO) Take by mouth.    . levothyroxine (SYNTHROID, LEVOTHROID) 100 MCG tablet Take 1 tablet (100 mcg total) by mouth daily. 90 tablet 3  . loratadine (CLARITIN) 10 MG tablet Take 10 mg by mouth daily.    . Multiple Vitamins-Minerals (CENTRUM SILVER PO) Take 1 tablet by mouth daily.     . Omega-3 Fatty Acids (FISH OIL) 1000 MG CAPS Take 3 capsules by mouth daily.      No current  facility-administered medications on file prior to visit.    No Known Allergies Social History   Socioeconomic History  . Marital status: Married    Spouse name: Not on file  . Number of children: Not on file  . Years of education: Not on file  . Highest education level: Not on  file  Occupational History  . Not on file  Social Needs  . Financial resource strain: Not on file  . Food insecurity:    Worry: Not on file    Inability: Not on file  . Transportation needs:    Medical: Not on file    Non-medical: Not on file  Tobacco Use  . Smoking status: Former Smoker    Last attempt to quit: 06/13/1971    Years since quitting: 46.4  . Smokeless tobacco: Never Used  Substance and Sexual Activity  . Alcohol use: Yes    Comment: 2 beers Monthly.  . Drug use: No  . Sexual activity: Not on file  Lifestyle  . Physical activity:    Days per week: Not on file    Minutes per session: Not on file  . Stress: Not on file  Relationships  . Social connections:    Talks on phone: Not on file    Gets together: Not on file    Attends religious service: Not on file    Active member of club or organization: Not on file    Attends meetings of clubs or organizations: Not on file    Relationship status: Not on file  . Intimate partner violence:    Fear of current or ex partner: Not on file    Emotionally abused: Not on file    Physically abused: Not on file    Forced sexual activity: Not on file  Other Topics Concern  . Not on file  Social History Narrative  . Not on file   Family History  Problem Relation Age of Onset  . Aneurysm Mother   . Alzheimer's disease Father   . Cancer Sister        ovarian  . Stroke Brother   . Hypertension Brother   . Colon cancer Neg Hx   . Esophageal cancer Neg Hx   . Stomach cancer Neg Hx   . Rectal cancer Neg Hx      Review of Systems  All other systems reviewed and are negative.      Objective:   Physical Exam  Constitutional: He is  oriented to person, place, and time. He appears well-developed and well-nourished. No distress.  HENT:  Head: Normocephalic and atraumatic.  Right Ear: External ear normal.  Left Ear: External ear normal.  Nose: Nose normal.  Mouth/Throat: Oropharynx is clear and moist. No oropharyngeal exudate.  Eyes: Pupils are equal, round, and reactive to light. Conjunctivae and EOM are normal. Right eye exhibits no discharge. Left eye exhibits no discharge. No scleral icterus.  Neck: Normal range of motion. Neck supple. No JVD present. No thyromegaly present.  Cardiovascular: Normal rate, regular rhythm and normal heart sounds. Exam reveals no gallop and no friction rub.  No murmur heard. Pulmonary/Chest: Effort normal and breath sounds normal. No stridor. No respiratory distress. He has no wheezes. He has no rales. He exhibits no tenderness.  Abdominal: Soft. Bowel sounds are normal. He exhibits no distension and no mass. There is no tenderness. There is no rebound and no guarding.  Musculoskeletal: Normal range of motion. He exhibits no edema or tenderness.  Lymphadenopathy:    He has no cervical adenopathy.  Neurological: He is alert and oriented to person, place, and time. He has normal reflexes. No cranial nerve deficit. He exhibits normal muscle tone. Coordination normal.  Skin: Skin is warm. No rash noted. He is not diaphoretic. No erythema. No pallor.  Psychiatric: He has a normal  mood and affect. His behavior is normal. Judgment and thought content normal.  Vitals reviewed.         Assessment & Plan:  Screening cholesterol level  Irritable bowel syndrome, unspecified type - Plan: ALPRAZolam (XANAX) 0.25 MG tablet  Other specified hypothyroidism  Annual physical exam  Physical exam today is outstanding.  Blood pressure is excellent.  Cholesterol slightly elevated.  I calculated his 10-year risk of cardiovascular disease and found it to be greater than 15%.  Therefore I recommended as  does the ACC/AHA guidelines, a moderate intensity statin.  Patient refuses a statin.  We discussed this at length and the patient politely declines.  Immunizations are up-to-date.  Cancer screening is up-to-date.  My only residual concern is this postnasal drainage.  I have recommended Flonase 2 sprays each nostril daily.  If the patient sees any further episodes of blood or has continued persistent postnasal drainage, I would recommend ENT consultation for laryngoscopy.  Patient agrees.  I did refill his Xanax that he uses sparingly.

## 2017-11-17 ENCOUNTER — Other Ambulatory Visit: Payer: Self-pay | Admitting: Family Medicine

## 2017-11-17 DIAGNOSIS — E038 Other specified hypothyroidism: Secondary | ICD-10-CM

## 2017-11-17 MED ORDER — LEVOTHYROXINE SODIUM 100 MCG PO TABS: 100.0000 ug | ORAL_TABLET | Freq: Every day | ORAL | 3 refills | Status: DC

## 2018-01-14 DIAGNOSIS — I10 Essential (primary) hypertension: Secondary | ICD-10-CM | POA: Diagnosis not present

## 2018-01-14 DIAGNOSIS — E7800 Pure hypercholesterolemia, unspecified: Secondary | ICD-10-CM | POA: Diagnosis not present

## 2018-01-14 DIAGNOSIS — N529 Male erectile dysfunction, unspecified: Secondary | ICD-10-CM | POA: Diagnosis not present

## 2018-01-26 DIAGNOSIS — D0462 Carcinoma in situ of skin of left upper limb, including shoulder: Secondary | ICD-10-CM | POA: Diagnosis not present

## 2018-01-26 DIAGNOSIS — D485 Neoplasm of uncertain behavior of skin: Secondary | ICD-10-CM | POA: Diagnosis not present

## 2018-01-26 DIAGNOSIS — L821 Other seborrheic keratosis: Secondary | ICD-10-CM | POA: Diagnosis not present

## 2018-01-26 DIAGNOSIS — L570 Actinic keratosis: Secondary | ICD-10-CM | POA: Diagnosis not present

## 2018-03-10 ENCOUNTER — Ambulatory Visit (INDEPENDENT_AMBULATORY_CARE_PROVIDER_SITE_OTHER): Payer: Medicare Other

## 2018-03-10 DIAGNOSIS — Z23 Encounter for immunization: Secondary | ICD-10-CM

## 2018-03-30 DIAGNOSIS — Z23 Encounter for immunization: Secondary | ICD-10-CM | POA: Diagnosis not present

## 2018-04-06 DIAGNOSIS — H2511 Age-related nuclear cataract, right eye: Secondary | ICD-10-CM | POA: Diagnosis not present

## 2018-04-06 DIAGNOSIS — Z961 Presence of intraocular lens: Secondary | ICD-10-CM | POA: Diagnosis not present

## 2018-05-06 ENCOUNTER — Other Ambulatory Visit: Payer: Self-pay | Admitting: Family Medicine

## 2018-05-06 DIAGNOSIS — R972 Elevated prostate specific antigen [PSA]: Secondary | ICD-10-CM

## 2018-05-06 DIAGNOSIS — E038 Other specified hypothyroidism: Secondary | ICD-10-CM

## 2018-05-06 DIAGNOSIS — Z13220 Encounter for screening for lipoid disorders: Secondary | ICD-10-CM

## 2018-05-06 DIAGNOSIS — Z0000 Encounter for general adult medical examination without abnormal findings: Secondary | ICD-10-CM

## 2018-05-08 ENCOUNTER — Other Ambulatory Visit: Payer: Medicare Other

## 2018-07-02 DIAGNOSIS — H40013 Open angle with borderline findings, low risk, bilateral: Secondary | ICD-10-CM | POA: Diagnosis not present

## 2018-07-02 DIAGNOSIS — H2513 Age-related nuclear cataract, bilateral: Secondary | ICD-10-CM | POA: Diagnosis not present

## 2018-08-04 ENCOUNTER — Other Ambulatory Visit: Payer: Self-pay | Admitting: Family Medicine

## 2018-08-04 ENCOUNTER — Telehealth: Payer: Self-pay | Admitting: Family Medicine

## 2018-08-04 MED ORDER — OSELTAMIVIR PHOSPHATE 75 MG PO CAPS: 75.0000 mg | ORAL_CAPSULE | Freq: Every day | ORAL | 0 refills | Status: DC

## 2018-08-04 NOTE — Telephone Encounter (Signed)
pts spouse has been dx with the flu, would like preventative measure called [facility] to [facility].

## 2018-08-04 NOTE — Telephone Encounter (Signed)
done

## 2018-08-04 NOTE — Telephone Encounter (Signed)
Pt aware.

## 2018-08-11 DIAGNOSIS — Z125 Encounter for screening for malignant neoplasm of prostate: Secondary | ICD-10-CM | POA: Diagnosis not present

## 2018-08-11 DIAGNOSIS — E7800 Pure hypercholesterolemia, unspecified: Secondary | ICD-10-CM | POA: Diagnosis not present

## 2018-08-11 DIAGNOSIS — Z0000 Encounter for general adult medical examination without abnormal findings: Secondary | ICD-10-CM | POA: Diagnosis not present

## 2018-08-11 DIAGNOSIS — I10 Essential (primary) hypertension: Secondary | ICD-10-CM | POA: Diagnosis not present

## 2018-08-11 DIAGNOSIS — Z1211 Encounter for screening for malignant neoplasm of colon: Secondary | ICD-10-CM | POA: Diagnosis not present

## 2018-08-11 DIAGNOSIS — N529 Male erectile dysfunction, unspecified: Secondary | ICD-10-CM | POA: Diagnosis not present

## 2018-11-10 ENCOUNTER — Other Ambulatory Visit: Payer: Self-pay

## 2018-11-10 ENCOUNTER — Other Ambulatory Visit: Payer: Medicare Other

## 2018-11-10 DIAGNOSIS — Z0000 Encounter for general adult medical examination without abnormal findings: Secondary | ICD-10-CM

## 2018-11-10 DIAGNOSIS — E038 Other specified hypothyroidism: Secondary | ICD-10-CM

## 2018-11-10 DIAGNOSIS — R972 Elevated prostate specific antigen [PSA]: Secondary | ICD-10-CM

## 2018-11-10 DIAGNOSIS — Z13220 Encounter for screening for lipoid disorders: Secondary | ICD-10-CM

## 2018-11-11 LAB — COMPREHENSIVE METABOLIC PANEL
AG Ratio: 1.6 (calc) (ref 1.0–2.5)
ALT: 10 U/L (ref 9–46)
AST: 16 U/L (ref 10–35)
Albumin: 4.5 g/dL (ref 3.6–5.1)
Alkaline phosphatase (APISO): 73 U/L (ref 35–144)
BUN: 19 mg/dL (ref 7–25)
CO2: 26 mmol/L (ref 20–32)
Calcium: 9.9 mg/dL (ref 8.6–10.3)
Chloride: 108 mmol/L (ref 98–110)
Creat: 1.14 mg/dL (ref 0.70–1.18)
Globulin: 2.9 g/dL (calc) (ref 1.9–3.7)
Glucose, Bld: 100 mg/dL — ABNORMAL HIGH (ref 65–99)
Potassium: 5.3 mmol/L (ref 3.5–5.3)
Sodium: 143 mmol/L (ref 135–146)
Total Bilirubin: 1.1 mg/dL (ref 0.2–1.2)
Total Protein: 7.4 g/dL (ref 6.1–8.1)

## 2018-11-11 LAB — CBC WITH DIFFERENTIAL/PLATELET
Absolute Monocytes: 598 cells/uL (ref 200–950)
Basophils Absolute: 72 cells/uL (ref 0–200)
Basophils Relative: 1.1 %
Eosinophils Absolute: 78 cells/uL (ref 15–500)
Eosinophils Relative: 1.2 %
HCT: 51.7 % — ABNORMAL HIGH (ref 38.5–50.0)
Hemoglobin: 17.1 g/dL (ref 13.2–17.1)
Lymphs Abs: 1957 cells/uL (ref 850–3900)
MCH: 30.1 pg (ref 27.0–33.0)
MCHC: 33.1 g/dL (ref 32.0–36.0)
MCV: 91 fL (ref 80.0–100.0)
MPV: 11.7 fL (ref 7.5–12.5)
Monocytes Relative: 9.2 %
Neutro Abs: 3796 cells/uL (ref 1500–7800)
Neutrophils Relative %: 58.4 %
Platelets: 192 10*3/uL (ref 140–400)
RBC: 5.68 10*6/uL (ref 4.20–5.80)
RDW: 12.7 % (ref 11.0–15.0)
Total Lymphocyte: 30.1 %
WBC: 6.5 10*3/uL (ref 3.8–10.8)

## 2018-11-11 LAB — LIPID PANEL
Cholesterol: 217 mg/dL — ABNORMAL HIGH (ref <200)
HDL: 46 mg/dL (ref >=40)
LDL Cholesterol (Calc): 147 mg/dL (calc) — ABNORMAL HIGH
Non-HDL Cholesterol (Calc): 171 mg/dL (calc) — ABNORMAL HIGH (ref <130)
Total CHOL/HDL Ratio: 4.7 (calc) (ref <5.0)
Triglycerides: 119 mg/dL (ref <150)

## 2018-11-11 LAB — TSH: TSH: 0.87 mIU/L (ref 0.40–4.50)

## 2018-11-11 LAB — PSA: PSA: 5.1 ng/mL — ABNORMAL HIGH (ref <=4.0)

## 2018-11-16 ENCOUNTER — Encounter: Payer: Medicare Other | Admitting: Family Medicine

## 2018-11-23 ENCOUNTER — Ambulatory Visit (AMBULATORY_SURGERY_CENTER): Payer: Self-pay | Admitting: *Deleted

## 2018-11-23 ENCOUNTER — Other Ambulatory Visit: Payer: Self-pay

## 2018-11-23 VITALS — Ht 76.0 in | Wt 195.0 lb

## 2018-11-23 DIAGNOSIS — Z86010 Personal history of colonic polyps: Secondary | ICD-10-CM

## 2018-11-23 MED ORDER — NA SULFATE-K SULFATE-MG SULF 17.5-3.13-1.6 GM/177ML PO SOLN: 1.0000 | Freq: Once | ORAL | 0 refills | Status: AC

## 2018-11-23 NOTE — Progress Notes (Signed)
No egg or soy allergy known to patient  No issues with past sedation with any surgeries  or procedures, no intubation problems - post op from hemorrhoid surgery couldn't void- no other issues - also had diff. With cath to relieve  No diet pills per patient No home 02 use per patient  No blood thinners per patient  Pt denies issues with constipation  No A fib or A flutter  EMMI video sent to pt's e mail   Pt verified name, DOB, address and insurance during PV today. Pt mailed instruction packet to included paper to complete and mail back to [facility] with addressed and stamped envelope, Emmi video, copy of consent form to read and not return, and instructions.. PV completed over the phone. Pt encouraged to call with questions or issues   Pt is aware that care partner will wait in the car during parking lot; if they feel like they will be too hot to wait in the car; they may wait in the lobby.  We want them to wear a mask (we do not have any that we can provide them), practice social distancing, and we will check their temperatures when they get here.  I did remind patient that their care partner needs to stay in the parking lot the entire time. Pt will wear mask into building

## 2018-11-24 ENCOUNTER — Ambulatory Visit (INDEPENDENT_AMBULATORY_CARE_PROVIDER_SITE_OTHER): Payer: Medicare Other | Admitting: Family Medicine

## 2018-11-24 VITALS — BP 150/90 | HR 111 | Temp 98.2°F | Resp 18 | Ht 76.0 in | Wt 195.0 lb

## 2018-11-24 DIAGNOSIS — Z0000 Encounter for general adult medical examination without abnormal findings: Secondary | ICD-10-CM

## 2018-11-24 DIAGNOSIS — R972 Elevated prostate specific antigen [PSA]: Secondary | ICD-10-CM | POA: Diagnosis not present

## 2018-11-24 DIAGNOSIS — E038 Other specified hypothyroidism: Secondary | ICD-10-CM | POA: Diagnosis not present

## 2018-11-24 DIAGNOSIS — K589 Irritable bowel syndrome without diarrhea: Secondary | ICD-10-CM

## 2018-11-24 DIAGNOSIS — Z13220 Encounter for screening for lipoid disorders: Secondary | ICD-10-CM

## 2018-11-24 DIAGNOSIS — Z0001 Encounter for general adult medical examination with abnormal findings: Secondary | ICD-10-CM

## 2018-11-24 MED ORDER — ALPRAZOLAM 0.25 MG PO TABS: 0.2500 mg | ORAL_TABLET | Freq: Every day | ORAL | 0 refills | Status: DC | PRN

## 2018-11-24 MED ORDER — CIPROFLOXACIN HCL 500 MG PO TABS: 500.0000 mg | ORAL_TABLET | Freq: Two times a day (BID) | ORAL | 0 refills | Status: DC

## 2018-11-24 MED ORDER — LEVOTHYROXINE SODIUM 100 MCG PO TABS: 100.0000 ug | ORAL_TABLET | Freq: Every day | ORAL | 3 refills | Status: DC

## 2018-11-24 NOTE — Progress Notes (Signed)
Subjective:    Patient ID: Carlos Choi, male    DOB: Apr 26, 1946, 73 y.o.   MRN: [id number]  HPI  Patient is here today for CPE.  Last colonoscopy was in 2015 and was normal.  He has a colonoscopy scheduled for 1 month from now.  As part of his physical lab work we did check a PSA that was greater than 5.  He believes his PSA last year at his urologist office was 2.2.  He states that in the past he has had 2 other episodes where his PSA would suddenly rise.  Both times he had negative biopsies.  Both times he believes the PSA would fall after he took antibiotics for prostatitis.  He denies any dysuria but he does report decreased urinary stream and urine Neri hesitancy particularly in the mornings when he first wakes.  He denies any hematuria or urinary retention.  Does request a refill on xanax.  He uses sparingly for anxiety which exacerbates his ibs.  He may take 1 pill a week as needed. His most recent lab work as listed below: Lab on 11/10/2018  Component Date Value Ref Range Status  . Glucose, Bld 11/10/2018 100* 65 - 99 mg/dL Final   Comment: .            Fasting reference interval . For someone without known diabetes, a glucose value between 100 and 125 mg/dL is consistent with prediabetes and should be confirmed with a follow-up test. .   . BUN 11/10/2018 19  7 - 25 mg/dL Final  . Creat 11/10/2018 1.14  0.70 - 1.18 mg/dL Final   Comment: For patients >64 years of age, the reference limit for Creatinine is approximately 13% higher for people identified as African-American. .   Havery Moros Ratio 70/35/0093 NOT APPLICABLE  6 - 22 (calc) Final  . Sodium 11/10/2018 143  135 - 146 mmol/L Final  . Potassium 11/10/2018 5.3  3.5 - 5.3 mmol/L Final  . Chloride 11/10/2018 108  98 - 110 mmol/L Final  . CO2 11/10/2018 26  20 - 32 mmol/L Final  . Calcium 11/10/2018 9.9  8.6 - 10.3 mg/dL Final  . Total Protein 11/10/2018 7.4  6.1 - 8.1 g/dL Final  . Albumin 11/10/2018 4.5  3.6 -  5.1 g/dL Final  . Globulin 11/10/2018 2.9  1.9 - 3.7 g/dL (calc) Final  . AG Ratio 11/10/2018 1.6  1.0 - 2.5 (calc) Final  . Total Bilirubin 11/10/2018 1.1  0.2 - 1.2 mg/dL Final  . Alkaline phosphatase (APISO) 11/10/2018 73  35 - 144 U/L Final  . AST 11/10/2018 16  10 - 35 U/L Final  . ALT 11/10/2018 10  9 - 46 U/L Final  . Cholesterol 11/10/2018 217* <200 mg/dL Final  . HDL 11/10/2018 46  > OR = 40 mg/dL Final  . Triglycerides 11/10/2018 119  <150 mg/dL Final  . LDL Cholesterol (Calc) 11/10/2018 147* mg/dL (calc) Final   Comment: Reference range: <100 . Desirable range <100 mg/dL for primary prevention;   <70 mg/dL for patients with CHD or diabetic patients  with > or = 2 CHD risk factors. [name] LDL-C is now calculated using the Martin-Hopkins  calculation, which is a validated novel method providing  better accuracy than the Friedewald equation in the  estimation of LDL-C.  Cresenciano Genre et al. Annamaria Helling. 8182;993(71): 2061-2068  (http://education.[facility].com/faq/FAQ164)   . Total CHOL/HDL Ratio 11/10/2018 4.7  <5.0 (calc) Final  . Non-HDL Cholesterol (Calc) 11/10/2018 171* <130  mg/dL (calc) Final   Comment: For patients with diabetes plus 1 major ASCVD risk  factor, treating to a non-HDL-C goal of <100 mg/dL  (LDL-C of <70 mg/dL) is considered a therapeutic  option.   [name] PSA 11/10/2018 5.1* < OR = 4.0 ng/mL Final   Comment: The total PSA value from this assay system is  standardized against the WHO standard. The test  result will be approximately 20% lower when compared  to the equimolar-standardized total PSA (Beckman  Coulter). Comparison of serial PSA results should be  interpreted with this fact in mind. . This test was performed using the Siemens  chemiluminescent method. Values obtained from  different assay methods cannot be used interchangeably. PSA levels, regardless of value, should not be interpreted as absolute evidence of the presence or absence of disease.    . WBC 11/10/2018 6.5  3.8 - 10.8 Thousand/uL Final  . RBC 11/10/2018 5.68  4.20 - 5.80 Million/uL Final  . Hemoglobin 11/10/2018 17.1  13.2 - 17.1 g/dL Final  . HCT 11/10/2018 51.7* 38.5 - 50.0 % Final  . MCV 11/10/2018 91.0  80.0 - 100.0 fL Final  . MCH 11/10/2018 30.1  27.0 - 33.0 pg Final  . MCHC 11/10/2018 33.1  32.0 - 36.0 g/dL Final  . RDW 11/10/2018 12.7  11.0 - 15.0 % Final  . Platelets 11/10/2018 192  140 - 400 Thousand/uL Final  . MPV 11/10/2018 11.7  7.5 - 12.5 fL Final  . Neutro Abs 11/10/2018 3,796  1,500 - 7,800 cells/uL Final  . Lymphs Abs 11/10/2018 1,957  850 - 3,900 cells/uL Final  . Absolute Monocytes 11/10/2018 598  200 - 950 cells/uL Final  . Eosinophils Absolute 11/10/2018 78  15 - 500 cells/uL Final  . Basophils Absolute 11/10/2018 72  0 - 200 cells/uL Final  . Neutrophils Relative % 11/10/2018 58.4  % Final  . Total Lymphocyte 11/10/2018 30.1  % Final  . Monocytes Relative 11/10/2018 9.2  % Final  . Eosinophils Relative 11/10/2018 1.2  % Final  . Basophils Relative 11/10/2018 1.1  % Final  . TSH 11/10/2018 0.87  0.40 - 4.50 mIU/L Final   Immunization History  Administered Date(s) Administered  . Influenza, High Dose Seasonal PF 04/02/2017  . Influenza,inj,Quad PF,6+ Mos 04/06/2013, 04/06/2014, 04/07/2015, 03/29/2016, 03/10/2018  . Influenza-Unspecified 03/17/2012  . Pneumococcal Conjugate-13 04/02/2017  . Pneumococcal Polysaccharide-23 01/23/2016  . Tdap 06/17/2009  . Zoster Recombinat (Shingrix) 04/02/2017, 06/02/2017    Past Medical History:  Diagnosis Date  . Allergy   . Anxiety   . Arthritis    mild ankles   . Cataract    removed left eye, forming right eye   . Hemorrhoids   . Hypothyroid   . Rectal bleeding    due to hemorrhoid    Past Surgical History:  Procedure Laterality Date  . CATARACT EXTRACTION Left   . COLONOSCOPY    . HERNIA REPAIR     Over ten years ago per patient. He was not sure of the date.  [name] POLYPECTOMY    . ROTATOR  CUFF REPAIR     both shoulders - patient does not remember the exact date   Current Outpatient Medications on File Prior to Visit  Medication Sig Dispense Refill  . aspirin 81 MG tablet Take 81 mg by mouth every other day.      . Lactobacillus-Inulin ([place] PO) Take by mouth.    . loratadine (CLARITIN) 10 MG tablet Take 10 mg by  mouth daily.    . Multiple Vitamins-Minerals (CENTRUM SILVER PO) Take 1 tablet by mouth daily.     . Omega-3 Fatty Acids (FISH OIL) 1000 MG CAPS Take 3 capsules by mouth daily.      No current facility-administered medications on file prior to visit.    No Known Allergies Social History   Socioeconomic History  . Marital status: Married    Spouse name: Not on file  . Number of children: Not on file  . Years of education: Not on file  . Highest education level: Not on file  Occupational History  . Not on file  Social Needs  . Financial resource strain: Not on file  . Food insecurity:    Worry: Not on file    Inability: Not on file  . Transportation needs:    Medical: Not on file    Non-medical: Not on file  Tobacco Use  . Smoking status: Former Smoker    Last attempt to quit: 06/13/1971    Years since quitting: 47.4  . Smokeless tobacco: Never Used  Substance and Sexual Activity  . Alcohol use: Yes    Comment: 2 beers Monthly.  . Drug use: No  . Sexual activity: Not on file  Lifestyle  . Physical activity:    Days per week: Not on file    Minutes per session: Not on file  . Stress: Not on file  Relationships  . Social connections:    Talks on phone: Not on file    Gets together: Not on file    Attends religious service: Not on file    Active member of club or organization: Not on file    Attends meetings of clubs or organizations: Not on file    Relationship status: Not on file  . Intimate partner violence:    Fear of current or ex partner: Not on file    Emotionally abused: Not on file    Physically abused: Not  on file    Forced sexual activity: Not on file  Other Topics Concern  . Not on file  Social History Narrative  . Not on file   Family History  Problem Relation Age of Onset  . Aneurysm Mother   . Alzheimer's disease Father   . Cancer Sister        ovarian  . Stroke Brother   . Hypertension Brother   . Colon cancer Neg Hx   . Esophageal cancer Neg Hx   . Stomach cancer Neg Hx   . Rectal cancer Neg Hx   . Colon polyps Neg Hx      Review of Systems  All other systems reviewed and are negative.      Objective:   Physical Exam  Constitutional: He is oriented to person, place, and time. He appears well-developed and well-nourished. No distress.  HENT:  Head: Normocephalic and atraumatic.  Right Ear: External ear normal.  Left Ear: External ear normal.  Nose: Nose normal.  Mouth/Throat: Oropharynx is clear and moist. No oropharyngeal exudate.  Eyes: Pupils are equal, round, and reactive to light. Conjunctivae and EOM are normal. Right eye exhibits no discharge. Left eye exhibits no discharge. No scleral icterus.  Neck: Normal range of motion. Neck supple. No JVD present. No thyromegaly present.  Cardiovascular: Normal rate, regular rhythm and normal heart sounds. Exam reveals no gallop and no friction rub.  No murmur heard. Pulmonary/Chest: Effort normal and breath sounds normal. No stridor. No respiratory distress. He has no  wheezes. He has no rales. He exhibits no tenderness.  Abdominal: Soft. Bowel sounds are normal. He exhibits no distension and no mass. There is no abdominal tenderness. There is no rebound and no guarding.  Musculoskeletal: Normal range of motion.        General: No tenderness or edema.  Lymphadenopathy:    He has no cervical adenopathy.  Neurological: He is alert and oriented to person, place, and time. He has normal reflexes. No cranial nerve deficit. He exhibits normal muscle tone. Coordination normal.  Skin: Skin is warm. No rash noted. He is not  diaphoretic. No erythema. No pallor.  Psychiatric: He has a normal mood and affect. His behavior is normal. Judgment and thought content normal.  Vitals reviewed.         Assessment & Plan:  Elevated PSA  Other specified hypothyroidism - Plan: levothyroxine (SYNTHROID) 100 MCG tablet  Irritable bowel syndrome, unspecified type - Plan: ALPRAZolam (XANAX) 0.25 MG tablet  Screening cholesterol level  Annual physical exam  Physical exam today is outstanding.  Blood pressure is elevated but the patient has well-documented whitecoat syndrome.  He states that his blood pressure at home is 120/80.  I am concerned by his elevated PSA.  We will try Cipro 500 mg p.o. twice daily for 10 days and then repeat a PSA to see if the patient may have mild prostatitis as he has in the past.  If persistently elevated, I would recommend follow-up with his urologist.  I did refill his Xanax with 90 tablets as I gave him last year.  This last him more than a year.  His immunizations are up-to-date.  His depression screen is normal.  His fall risk assessment is normal.  He denies any difficulty performing ADLs.  His colonoscopy is scheduled for 1 month from now.  TSH is excellent.  I refilled his Synthroid.

## 2018-12-01 ENCOUNTER — Telehealth: Payer: Self-pay | Admitting: Gastroenterology

## 2018-12-01 NOTE — Telephone Encounter (Signed)
Pt made aware we have the Cipro on his med list.   Pt states prep is 139$- not covered- explained I can call and give coupon to drop price to $50- pt states he is okay with that price- Called [facility]- gave PNM $50 coupon over the phone   BIN [id number] PCN [id number] GROUP [id number] ID [id number]   Per Pharmacist, priced dropped to $50   Lelan Pons

## 2018-12-01 NOTE — Telephone Encounter (Signed)
Patient called said that he forgot to metion the med CIPROSLOXACIN HCl 500mg  at his previsit that he had on 6-09.

## 2018-12-08 ENCOUNTER — Encounter: Payer: Medicare Other | Admitting: Gastroenterology

## 2018-12-12 ENCOUNTER — Other Ambulatory Visit: Payer: Self-pay | Admitting: Family Medicine

## 2018-12-12 DIAGNOSIS — E038 Other specified hypothyroidism: Secondary | ICD-10-CM

## 2018-12-14 DIAGNOSIS — Z0120 Encounter for dental examination and cleaning without abnormal findings: Secondary | ICD-10-CM | POA: Diagnosis not present

## 2018-12-15 ENCOUNTER — Telehealth: Payer: Self-pay | Admitting: Gastroenterology

## 2018-12-15 NOTE — Telephone Encounter (Signed)

## 2018-12-16 ENCOUNTER — Encounter: Payer: Self-pay | Admitting: Gastroenterology

## 2018-12-16 ENCOUNTER — Other Ambulatory Visit: Payer: Self-pay

## 2018-12-16 ENCOUNTER — Ambulatory Visit (AMBULATORY_SURGERY_CENTER): Payer: Medicare Other | Admitting: Gastroenterology

## 2018-12-16 VITALS — BP 124/64 | HR 61 | Temp 98.2°F | Resp 14 | Ht 76.0 in | Wt 195.0 lb

## 2018-12-16 DIAGNOSIS — Z1211 Encounter for screening for malignant neoplasm of colon: Secondary | ICD-10-CM | POA: Diagnosis not present

## 2018-12-16 DIAGNOSIS — Z86010 Personal history of colonic polyps: Secondary | ICD-10-CM | POA: Diagnosis not present

## 2018-12-16 MED ORDER — SODIUM CHLORIDE 0.9 % IV SOLN: 500.0000 mL | Freq: Once | INTRAVENOUS | Status: DC

## 2018-12-16 NOTE — Progress Notes (Signed)
Pt's states no medical or surgical changes since previsit or office visit.  Temp-[place]  Vital signs-judy bronson

## 2018-12-16 NOTE — Op Note (Signed)
[place] Patient Name: Carlos Choi Procedure Date: 12/16/2018 9:03 AM MRN: [id number] Endoscopist: Ladene Artist , MD Age: 73 Referring MD:  Date of Birth: February 29, 1946 Gender: Male Account #: [id number] Procedure:                Colonoscopy Indications:              Surveillance: Personal history of adenomatous                            polyps on last colonoscopy 5 years ago Medicines:                Monitored Anesthesia Care Procedure:                Pre-Anesthesia Assessment:                           - Prior to the procedure, a History and Physical                            was performed, and patient medications and                            allergies were reviewed. [name] tolerance of                            previous anesthesia was also reviewed. The risks                            and benefits of the procedure and the sedation                            options and risks were discussed with the patient.                            All questions were answered, and informed consent                            was obtained. Prior Anticoagulants: The patient has                            taken no previous anticoagulant or antiplatelet                            agents. ASA Grade Assessment: II - A patient with                            mild systemic disease. After reviewing the risks                            and benefits, the patient was deemed in                            satisfactory condition to undergo the procedure.  After obtaining informed consent, the colonoscope                            was passed under direct vision. Throughout the                            procedure, the patient's blood pressure, pulse, and                            oxygen saturations were monitored continuously. The                            Colonoscope was introduced through the anus and                            advanced to the the cecum,  identified by                            appendiceal orifice and ileocecal valve. The                            ileocecal valve, appendiceal orifice, and rectum                            were photographed. The quality of the bowel                            preparation was good. The colonoscopy was performed                            without difficulty. The patient tolerated the                            procedure well. Scope In: 9:08:11 AM Scope Out: 9:21:53 AM Scope Withdrawal Time: 0 hours 10 minutes 56 seconds  Total Procedure Duration: 0 hours 13 minutes 42 seconds  Findings:                 The perianal and digital rectal examinations were                            normal.                           Multiple medium-mouthed diverticula were found in                            the left colon. There was evidence of diverticular                            spasm. There was evidence of an impacted                            diverticulum. There was no evidence of diverticular  bleeding.                           Internal hemorrhoids were found during                            retroflexion. The hemorrhoids were small and Grade                            I (internal hemorrhoids that do not prolapse).                           The exam was otherwise without abnormality on                            direct and retroflexion views. Complications:            No immediate complications. Estimated blood loss:                            None. Estimated Blood Loss:     Estimated blood loss: none. Impression:               - Moderate diverticulosis in the left colon. There                            was evidence of diverticular spasm. There was                            evidence of an impacted diverticulum. There was no                            evidence of diverticular bleeding.                           - Internal hemorrhoids.                           - The  examination was otherwise normal on direct                            and retroflexion views.                           - No specimens collected. Recommendation:           - Repeat colonoscopy in 7 years for surveillance.                           - Patient has a contact number available for                            emergencies. The signs and symptoms of potential                            delayed complications were discussed with the  patient. Return to normal activities tomorrow.                            Written discharge instructions were provided to the                            patient.                           - High fiber diet.                           - Continue present medications. Ladene Artist, MD 12/16/2018 9:25:31 AM This report has been signed electronically.

## 2018-12-16 NOTE — Patient Instructions (Signed)
Handout on hemorrhoids, diverticulosis, and high fiber diet  YOU HAD AN ENDOSCOPIC PROCEDURE TODAY AT [facility]:   Refer to the procedure report that was given to you for any specific questions about what was found during the examination.  If the procedure report does not answer your questions, please call your gastroenterologist to clarify.  If you requested that your care partner not be given the details of your procedure findings, then the procedure report has been included in a sealed envelope for you to review at your convenience later.  YOU SHOULD EXPECT: Some feelings of bloating in the abdomen. Passage of more gas than usual.  Walking can help get rid of the air that was put into your GI tract during the procedure and reduce the bloating. If you had a lower endoscopy (such as a colonoscopy or flexible sigmoidoscopy) you may notice spotting of blood in your stool or on the toilet paper. If you underwent a bowel prep for your procedure, you may not have a normal bowel movement for a few days.  Please Note:  You might notice some irritation and congestion in your nose or some drainage.  This is from the oxygen used during your procedure.  There is no need for concern and it should clear up in a day or so.  SYMPTOMS TO REPORT IMMEDIATELY:   Following lower endoscopy (colonoscopy or flexible sigmoidoscopy):  Excessive amounts of blood in the stool  Significant tenderness or worsening of abdominal pains  Swelling of the abdomen that is new, acute  Fever of 100F or higher   For urgent or emergent issues, a gastroenterologist can be reached at any hour by calling [phone].   DIET:  We do recommend a small meal at first, but then you may proceed to your regular diet.  Drink plenty of fluids but you should avoid alcoholic beverages for 24 hours.  ACTIVITY:  You should plan to take it easy for the rest of today and you should NOT DRIVE or use heavy machinery until  tomorrow (because of the sedation medicines used during the test).    FOLLOW UP: Our staff will call the number listed on your records 48-72 hours following your procedure to check on you and address any questions or concerns that you may have regarding the information given to you following your procedure. If we do not reach you, we will leave a message.  We will attempt to reach you two times.  During this call, we will ask if you have developed any symptoms of COVID 19. If you develop any symptoms (ie: fever, flu-like symptoms, shortness of breath, cough etc.) before then, please call [phone].  If you test positive for Covid 19 in the 2 weeks post procedure, please call and report this information to [place].    If any biopsies were taken you will be contacted by phone or by letter within the next 1-3 weeks.  Please call us at [phone] if you have not heard about the biopsies in 3 weeks.    SIGNATURES/CONFIDENTIALITY: You and/or your care partner have signed paperwork which will be entered into your electronic medical record.  These signatures attest to the fact that that the information above on your After Visit Summary has been reviewed and is understood.  Full responsibility of the confidentiality of this discharge information lies with you and/or your care-partner.

## 2018-12-16 NOTE — Progress Notes (Signed)
To PACU, VSS. Report to [email or web address] 

## 2018-12-21 ENCOUNTER — Telehealth: Payer: Self-pay

## 2018-12-21 NOTE — Telephone Encounter (Signed)
  Follow up Call-  Call back number 12/16/2018  Post procedure Call Back phone  # [phone]  Permission to leave phone message Yes  Some recent data might be hidden     Patient questions:  Do you have a fever, pain , or abdominal swelling? Yes.   Pain Score  0 *  Have you tolerated food without any problems? Yes.    Have you been able to return to your normal activities? Yes.    Do you have any questions about your discharge instructions: Diet   No. Medications  No. Follow up visit  No.  Do you have questions or concerns about your Care? No.  Actions: * If pain score is 4 or above: No action needed, pain <4.  1. Have you developed a fever since your procedure? no  2.   Have you had an respiratory symptoms (SOB or cough) since your procedure? no  3.   Have you tested positive for COVID 19 since your procedure no  4.   Have you had any family members/close contacts diagnosed with the COVID 19 since your procedure?  no   If yes to any of these questions please route to Joylene John, RN and Alphonsa Gin, Therapist, sports.

## 2019-01-20 DIAGNOSIS — R972 Elevated prostate specific antigen [PSA]: Secondary | ICD-10-CM | POA: Diagnosis not present

## 2019-01-27 DIAGNOSIS — R972 Elevated prostate specific antigen [PSA]: Secondary | ICD-10-CM | POA: Diagnosis not present

## 2019-01-27 DIAGNOSIS — R350 Frequency of micturition: Secondary | ICD-10-CM | POA: Diagnosis not present

## 2019-01-27 DIAGNOSIS — N401 Enlarged prostate with lower urinary tract symptoms: Secondary | ICD-10-CM | POA: Diagnosis not present

## 2019-01-27 DIAGNOSIS — N5201 Erectile dysfunction due to arterial insufficiency: Secondary | ICD-10-CM | POA: Diagnosis not present

## 2019-02-01 DIAGNOSIS — L570 Actinic keratosis: Secondary | ICD-10-CM | POA: Diagnosis not present

## 2019-02-01 DIAGNOSIS — D1801 Hemangioma of skin and subcutaneous tissue: Secondary | ICD-10-CM | POA: Diagnosis not present

## 2019-02-01 DIAGNOSIS — L821 Other seborrheic keratosis: Secondary | ICD-10-CM | POA: Diagnosis not present

## 2019-02-01 DIAGNOSIS — L820 Inflamed seborrheic keratosis: Secondary | ICD-10-CM | POA: Diagnosis not present

## 2019-02-23 DIAGNOSIS — I10 Essential (primary) hypertension: Secondary | ICD-10-CM | POA: Diagnosis not present

## 2019-02-23 DIAGNOSIS — E7800 Pure hypercholesterolemia, unspecified: Secondary | ICD-10-CM | POA: Diagnosis not present

## 2019-02-23 DIAGNOSIS — N529 Male erectile dysfunction, unspecified: Secondary | ICD-10-CM | POA: Diagnosis not present

## 2019-03-16 ENCOUNTER — Ambulatory Visit (INDEPENDENT_AMBULATORY_CARE_PROVIDER_SITE_OTHER): Payer: Medicare Other

## 2019-03-16 DIAGNOSIS — Z23 Encounter for immunization: Secondary | ICD-10-CM

## 2019-04-06 DIAGNOSIS — Z23 Encounter for immunization: Secondary | ICD-10-CM | POA: Diagnosis not present

## 2019-07-01 DIAGNOSIS — Z0120 Encounter for dental examination and cleaning without abnormal findings: Secondary | ICD-10-CM | POA: Diagnosis not present

## 2019-07-07 ENCOUNTER — Ambulatory Visit: Payer: Medicare Other | Attending: Internal Medicine

## 2019-07-07 DIAGNOSIS — Z23 Encounter for immunization: Secondary | ICD-10-CM | POA: Insufficient documentation

## 2019-07-07 NOTE — Progress Notes (Signed)
   Covid-19 Vaccination Clinic  Name:  Carlos Choi    MRN: [id number] DOB: [birth year]  07/07/2019  [name] was observed post Covid-19 immunization for 15 minutes without incidence. He was provided with Vaccine Information Sheet and instruction to access the V-Safe system.   [name] was instructed to call 911 with any severe reactions post vaccine: [name] Difficulty breathing  . Swelling of your face and throat  . A fast heartbeat  . A bad rash all over your body  . Dizziness and weakness    Immunizations Administered    Name Date Dose VIS Date Route   Pfizer COVID-19 Vaccine 07/07/2019  4:58 PM 0.3 mL 05/28/2019 Intramuscular   Manufacturer: [place]   Lot: [id number]   [place]: [id number]

## 2019-07-26 ENCOUNTER — Ambulatory Visit: Payer: Medicare Other

## 2019-07-28 ENCOUNTER — Ambulatory Visit: Payer: Medicare Other | Attending: Internal Medicine

## 2019-07-28 DIAGNOSIS — Z23 Encounter for immunization: Secondary | ICD-10-CM | POA: Insufficient documentation

## 2019-07-28 NOTE — Progress Notes (Signed)
   Covid-19 Vaccination Clinic  Name:  Carlos Choi    MRN: [id number] DOB: December 25, 1945  07/28/2019  Mr. Carlos Choi was observed post Covid-19 immunization for 15 minutes without incidence. He was provided with Vaccine Information Sheet and instruction to access the V-Safe system.   Mr. Carlos Choi was instructed to call 911 with any severe reactions post vaccine: [name] Difficulty breathing  . Swelling of your face and throat  . A fast heartbeat  . A bad rash all over your body  . Dizziness and weakness    Immunizations Administered    Name Date Dose VIS Date Route   Pfizer COVID-19 Vaccine 07/28/2019  1:22 PM 0.3 mL 05/28/2019 Intramuscular   Manufacturer: [place]   Lot: [id number]   [place]: [id number]

## 2019-08-02 DIAGNOSIS — Z23 Encounter for immunization: Secondary | ICD-10-CM | POA: Diagnosis not present

## 2019-08-17 DIAGNOSIS — N433 Hydrocele, unspecified: Secondary | ICD-10-CM | POA: Diagnosis not present

## 2019-08-17 DIAGNOSIS — Z0000 Encounter for general adult medical examination without abnormal findings: Secondary | ICD-10-CM | POA: Diagnosis not present

## 2019-08-17 DIAGNOSIS — Z125 Encounter for screening for malignant neoplasm of prostate: Secondary | ICD-10-CM | POA: Diagnosis not present

## 2019-08-17 DIAGNOSIS — E7800 Pure hypercholesterolemia, unspecified: Secondary | ICD-10-CM | POA: Diagnosis not present

## 2019-08-17 DIAGNOSIS — N529 Male erectile dysfunction, unspecified: Secondary | ICD-10-CM | POA: Diagnosis not present

## 2019-08-17 DIAGNOSIS — Z1211 Encounter for screening for malignant neoplasm of colon: Secondary | ICD-10-CM | POA: Diagnosis not present

## 2019-08-17 DIAGNOSIS — R7303 Prediabetes: Secondary | ICD-10-CM | POA: Diagnosis not present

## 2019-08-17 DIAGNOSIS — I10 Essential (primary) hypertension: Secondary | ICD-10-CM | POA: Diagnosis not present

## 2019-08-30 DIAGNOSIS — Z23 Encounter for immunization: Secondary | ICD-10-CM | POA: Diagnosis not present

## 2019-10-18 DIAGNOSIS — R3912 Poor urinary stream: Secondary | ICD-10-CM | POA: Diagnosis not present

## 2019-10-18 DIAGNOSIS — N401 Enlarged prostate with lower urinary tract symptoms: Secondary | ICD-10-CM | POA: Diagnosis not present

## 2019-11-19 ENCOUNTER — Other Ambulatory Visit: Payer: Self-pay | Admitting: Family Medicine

## 2019-11-19 ENCOUNTER — Telehealth: Payer: Self-pay | Admitting: Family Medicine

## 2019-11-19 DIAGNOSIS — E038 Other specified hypothyroidism: Secondary | ICD-10-CM

## 2019-11-19 DIAGNOSIS — Z0000 Encounter for general adult medical examination without abnormal findings: Secondary | ICD-10-CM

## 2019-11-19 DIAGNOSIS — R5383 Other fatigue: Secondary | ICD-10-CM

## 2019-11-19 NOTE — Telephone Encounter (Signed)
Patient called in requesting a B12 to be drawn with his lab work for his physical. He is coming in on the 10th for his labs and his physical is on the 17th. He has been having issues with feeling fatigue. Ok to place orders for B12?

## 2019-11-19 NOTE — Telephone Encounter (Signed)
ok 

## 2019-11-19 NOTE — Telephone Encounter (Signed)
Lab orders placed.  

## 2019-11-25 ENCOUNTER — Other Ambulatory Visit: Payer: Self-pay

## 2019-11-25 ENCOUNTER — Other Ambulatory Visit: Payer: Medicare Other

## 2019-11-25 DIAGNOSIS — R5383 Other fatigue: Secondary | ICD-10-CM | POA: Diagnosis not present

## 2019-11-25 DIAGNOSIS — Z0000 Encounter for general adult medical examination without abnormal findings: Secondary | ICD-10-CM | POA: Diagnosis not present

## 2019-11-25 DIAGNOSIS — E038 Other specified hypothyroidism: Secondary | ICD-10-CM | POA: Diagnosis not present

## 2019-11-25 LAB — COMPREHENSIVE METABOLIC PANEL
AG Ratio: 1.5 (calc) (ref 1.0–2.5)
ALT: 9 U/L (ref 9–46)
AST: 14 U/L (ref 10–35)
Albumin: 3.9 g/dL (ref 3.6–5.1)
Alkaline phosphatase (APISO): 69 U/L (ref 35–144)
BUN: 19 mg/dL (ref 7–25)
CO2: 24 mmol/L (ref 20–32)
Calcium: 8.8 mg/dL (ref 8.6–10.3)
Chloride: 105 mmol/L (ref 98–110)
Creat: 1.07 mg/dL (ref 0.70–1.18)
Globulin: 2.6 g/dL (calc) (ref 1.9–3.7)
Glucose, Bld: 84 mg/dL (ref 65–99)
Potassium: 4.9 mmol/L (ref 3.5–5.3)
Sodium: 142 mmol/L (ref 135–146)
Total Bilirubin: 0.7 mg/dL (ref 0.2–1.2)
Total Protein: 6.5 g/dL (ref 6.1–8.1)

## 2019-11-25 LAB — CBC WITH DIFFERENTIAL/PLATELET
Absolute Monocytes: 800 cells/uL (ref 200–950)
Basophils Absolute: 70 cells/uL (ref 0–200)
Basophils Relative: 0.8 %
Eosinophils Absolute: 139 cells/uL (ref 15–500)
Eosinophils Relative: 1.6 %
HCT: 45.3 % (ref 38.5–50.0)
Hemoglobin: 15.7 g/dL (ref 13.2–17.1)
Lymphs Abs: 1349 cells/uL (ref 850–3900)
MCH: 31.5 pg (ref 27.0–33.0)
MCHC: 34.7 g/dL (ref 32.0–36.0)
MCV: 90.8 fL (ref 80.0–100.0)
MPV: 11.9 fL (ref 7.5–12.5)
Monocytes Relative: 9.2 %
Neutro Abs: 6342 cells/uL (ref 1500–7800)
Neutrophils Relative %: 72.9 %
Platelets: 183 10*3/uL (ref 140–400)
RBC: 4.99 10*6/uL (ref 4.20–5.80)
RDW: 12.7 % (ref 11.0–15.0)
Total Lymphocyte: 15.5 %
WBC: 8.7 10*3/uL (ref 3.8–10.8)

## 2019-11-25 LAB — PSA: PSA: 2.2 ng/mL (ref <=4.0)

## 2019-11-25 LAB — LIPID PANEL
Cholesterol: 151 mg/dL (ref <200)
HDL: 41 mg/dL (ref >=40)
LDL Cholesterol (Calc): 95 mg/dL (calc)
Non-HDL Cholesterol (Calc): 110 mg/dL (calc) (ref <130)
Total CHOL/HDL Ratio: 3.7 (calc) (ref <5.0)
Triglycerides: 63 mg/dL (ref <150)

## 2019-11-25 LAB — VITAMIN B12: Vitamin B-12: 439 pg/mL (ref 200–1100)

## 2019-11-25 LAB — TSH: TSH: 1 mIU/L (ref 0.40–4.50)

## 2019-11-29 ENCOUNTER — Encounter: Payer: Medicare Other | Admitting: Family Medicine

## 2019-12-02 ENCOUNTER — Ambulatory Visit (INDEPENDENT_AMBULATORY_CARE_PROVIDER_SITE_OTHER): Payer: Medicare Other | Admitting: Family Medicine

## 2019-12-02 ENCOUNTER — Other Ambulatory Visit: Payer: Self-pay

## 2019-12-02 VITALS — BP 130/80 | HR 81 | Temp 96.4°F | Ht 76.0 in | Wt 191.0 lb

## 2019-12-02 DIAGNOSIS — E038 Other specified hypothyroidism: Secondary | ICD-10-CM

## 2019-12-02 DIAGNOSIS — K589 Irritable bowel syndrome without diarrhea: Secondary | ICD-10-CM | POA: Diagnosis not present

## 2019-12-02 DIAGNOSIS — Z0001 Encounter for general adult medical examination with abnormal findings: Secondary | ICD-10-CM | POA: Diagnosis not present

## 2019-12-02 DIAGNOSIS — Z0000 Encounter for general adult medical examination without abnormal findings: Secondary | ICD-10-CM

## 2019-12-02 MED ORDER — ALPRAZOLAM 0.25 MG PO TABS: 0.2500 mg | ORAL_TABLET | Freq: Every day | ORAL | 0 refills | Status: DC | PRN

## 2019-12-02 MED ORDER — LEVOTHYROXINE SODIUM 100 MCG PO TABS: 100.0000 ug | ORAL_TABLET | Freq: Every day | ORAL | 3 refills | Status: DC

## 2019-12-02 NOTE — Progress Notes (Signed)
Subjective:    Patient ID: Carlos Choi, male    DOB: [birth year], 74 y.o.   MRN: [id number]  HPI  Patient is here today for CPE.  Last colonoscopy was in 2020 and was normal.  Immunizations are up-to-date as evidenced below: Immunization History  Administered Date(s) Administered  . Fluad Quad(high Dose 65+) 03/16/2019  . Influenza, High Dose Seasonal PF 04/02/2017  . Influenza,inj,Quad PF,6+ Mos 04/06/2013, 04/06/2014, 04/07/2015, 03/29/2016, 03/10/2018  . Influenza-Unspecified 03/17/2012  . PFIZER SARS-COV-2 Vaccination 07/07/2019, 07/28/2019  . Pneumococcal Conjugate-13 04/02/2017  . Pneumococcal Polysaccharide-23 01/23/2016  . Tdap 06/17/2009  . Zoster Recombinat (Shingrix) 04/02/2017, 06/02/2017   PSA has fallen back to his normal range of 2.2 although he does still have a visit with his urologist later this month.  He denies any falls.  He denies any depression.  He denies any memory loss.  He does report fatigue and occasional hypersomnolence.  He denies any chest pain shortness of breath or dyspnea on exertion.  He denies any orthopnea.  He denies any snoring or apneic episodes.  He denies any fevers or chills or weight loss.  He denies any melena or hematochezia.  TSH and B12 are normal.  He does feel that he is not sleeping well.  He only sleeps 4 to 5 hours every night.  Does request a refill on xanax.  He uses sparingly for anxiety which exacerbates his ibs.  He may take 1 pill a week as needed. His most recent lab work as listed below: Appointment on 11/25/2019  Component Date Value Ref Range Status  . TSH 11/25/2019 1.00  0.40 - 4.50 mIU/L Final  . WBC 11/25/2019 8.7  3.8 - 10.8 Thousand/uL Final  . RBC 11/25/2019 4.99  4.20 - 5.80 Million/uL Final  . Hemoglobin 11/25/2019 15.7  13.2 - 17.1 g/dL Final  . HCT 11/25/2019 45.3  38 - 50 % Final  . MCV 11/25/2019 90.8  80.0 - 100.0 fL Final  . MCH 11/25/2019 31.5  27.0 - 33.0 pg Final  . MCHC 11/25/2019 34.7  32.0 - 36.0  g/dL Final  . RDW 11/25/2019 12.7  11.0 - 15.0 % Final  . Platelets 11/25/2019 183  140 - 400 Thousand/uL Final  . MPV 11/25/2019 11.9  7.5 - 12.5 fL Final  . Neutro Abs 11/25/2019 6,342  1,500 - 7,800 cells/uL Final  . Lymphs Abs 11/25/2019 1,349  850 - 3,900 cells/uL Final  . Absolute Monocytes 11/25/2019 800  200 - 950 cells/uL Final  . Eosinophils Absolute 11/25/2019 139  15 - 500 cells/uL Final  . Basophils Absolute 11/25/2019 70  0 - 200 cells/uL Final  . Neutrophils Relative % 11/25/2019 72.9  % Final  . Total Lymphocyte 11/25/2019 15.5  % Final  . Monocytes Relative 11/25/2019 9.2  % Final  . Eosinophils Relative 11/25/2019 1.6  % Final  . Basophils Relative 11/25/2019 0.8  % Final  . Cholesterol 11/25/2019 151  <200 mg/dL Final  . HDL 11/25/2019 41  > OR = 40 mg/dL Final  . Triglycerides 11/25/2019 63  <150 mg/dL Final  . LDL Cholesterol (Calc) 11/25/2019 95  mg/dL (calc) Final   Comment: Reference range: <100 . Desirable range <100 mg/dL for primary prevention;   <70 mg/dL for patients with CHD or diabetic patients  with > or = 2 CHD risk factors. [name] LDL-C is now calculated using the Martin-Hopkins  calculation, which is a validated novel method providing  better accuracy than the Friedewald equation  in the  estimation of LDL-C.  Cresenciano Genre et al. Annamaria Helling. 1761;607(37): 2061-2068  (http://education.[facility].com/faq/FAQ164)   . Total CHOL/HDL Ratio 11/25/2019 3.7  <5.0 (calc) Final  . Non-HDL Cholesterol (Calc) 11/25/2019 110  <130 mg/dL (calc) Final   Comment: For patients with diabetes plus 1 major ASCVD risk  factor, treating to a non-HDL-C goal of <100 mg/dL  (LDL-C of <70 mg/dL) is considered a therapeutic  option.   . Vitamin B-12 11/25/2019 439  200 - 1,100 pg/mL Final  . PSA 11/25/2019 2.2  < OR = 4.0 ng/mL Final   Comment: The total PSA value from this assay system is  standardized against the WHO standard. The test  result will be approximately 20%  lower when compared  to the equimolar-standardized total PSA (Beckman  Coulter). Comparison of serial PSA results should be  interpreted with this fact in mind. . This test was performed using the Siemens  chemiluminescent method. Values obtained from  different assay methods cannot be used interchangeably. PSA levels, regardless of value, should not be interpreted as absolute evidence of the presence or absence of disease.   . Glucose, Bld 11/25/2019 84  65 - 99 mg/dL Final   Comment: .            Fasting reference interval .   . BUN 11/25/2019 19  7 - 25 mg/dL Final  . Creat 11/25/2019 1.07  0.70 - 1.18 mg/dL Final   Comment: For patients >50 years of age, the reference limit for Creatinine is approximately 13% higher for people identified as African-American. .   Havery Moros Ratio 10/62/6948 NOT APPLICABLE  6 - 22 (calc) Final  . Sodium 11/25/2019 142  135 - 146 mmol/L Final  . Potassium 11/25/2019 4.9  3.5 - 5.3 mmol/L Final  . Chloride 11/25/2019 105  98 - 110 mmol/L Final  . CO2 11/25/2019 24  20 - 32 mmol/L Final  . Calcium 11/25/2019 8.8  8.6 - 10.3 mg/dL Final  . Total Protein 11/25/2019 6.5  6.1 - 8.1 g/dL Final  . Albumin 11/25/2019 3.9  3.6 - 5.1 g/dL Final  . Globulin 11/25/2019 2.6  1.9 - 3.7 g/dL (calc) Final  . AG Ratio 11/25/2019 1.5  1.0 - 2.5 (calc) Final  . Total Bilirubin 11/25/2019 0.7  0.2 - 1.2 mg/dL Final  . Alkaline phosphatase (APISO) 11/25/2019 69  35 - 144 U/L Final  . AST 11/25/2019 14  10 - 35 U/L Final  . ALT 11/25/2019 9  9 - 46 U/L Final   Immunization History  Administered Date(s) Administered  . Fluad Quad(high Dose 65+) 03/16/2019  . Influenza, High Dose Seasonal PF 04/02/2017  . Influenza,inj,Quad PF,6+ Mos 04/06/2013, 04/06/2014, 04/07/2015, 03/29/2016, 03/10/2018  . Influenza-Unspecified 03/17/2012  . PFIZER SARS-COV-2 Vaccination 07/07/2019, 07/28/2019  . Pneumococcal Conjugate-13 04/02/2017  . Pneumococcal Polysaccharide-23  01/23/2016  . Tdap 06/17/2009  . Zoster Recombinat (Shingrix) 04/02/2017, 06/02/2017    Past Medical History:  Diagnosis Date  . Allergy   . Anxiety   . Arthritis    mild ankles   . Cataract    removed left eye, forming right eye   . Hemorrhoids   . Hypothyroid   . Rectal bleeding    due to hemorrhoid    Past Surgical History:  Procedure Laterality Date  . CATARACT EXTRACTION Left   . COLONOSCOPY    . HERNIA REPAIR     Over ten years ago per patient. He was not sure of the date.  [name]  POLYPECTOMY    . ROTATOR CUFF REPAIR     both shoulders - patient does not remember the exact date   Current Outpatient Medications on File Prior to Visit  Medication Sig Dispense Refill  . aspirin 81 MG tablet Take 81 mg by mouth every other day.      . Lactobacillus-Inulin ([place] PO) Take by mouth.    . loratadine (CLARITIN) 10 MG tablet Take 10 mg by mouth daily.    . Multiple Vitamins-Minerals (CENTRUM SILVER PO) Take 1 tablet by mouth daily.     . Omega-3 Fatty Acids (FISH OIL) 1000 MG CAPS Take 3 capsules by mouth daily.     . ciprofloxacin (CIPRO) 500 MG tablet Take 1 tablet (500 mg total) by mouth 2 (two) times daily. (Patient not taking: Reported on 12/02/2019) 20 tablet 0   No current facility-administered medications on file prior to visit.   No Known Allergies Social History   Socioeconomic History  . Marital status: Married    Spouse name: Not on file  . Number of children: Not on file  . Years of education: Not on file  . Highest education level: Not on file  Occupational History  . Not on file  Tobacco Use  . Smoking status: Former Smoker    Quit date: 06/13/1971    Years since quitting: 48.5  . Smokeless tobacco: Never Used  Substance and Sexual Activity  . Alcohol use: Yes    Comment: 2 beers Monthly.  . Drug use: No  . Sexual activity: Not on file  Other Topics Concern  . Not on file  Social History Narrative  . Not on file   Social  Determinants of Health   Financial Resource Strain:   . Difficulty of Paying Living Expenses:   Food Insecurity:   . Worried About Charity fundraiser in the Last Year:   . Arboriculturist in the Last Year:   Transportation Needs:   . Film/video editor (Medical):   [name] Lack of Transportation (Non-Medical):   Physical Activity:   . Days of Exercise per Week:   . Minutes of Exercise per Session:   Stress:   . Feeling of Stress :   Social Connections:   . Frequency of Communication with Friends and Family:   . Frequency of Social Gatherings with Friends and Family:   . Attends Religious Services:   . Active Member of Clubs or Organizations:   . Attends Archivist Meetings:   [name] Marital Status:   Intimate Partner Violence:   . Fear of Current or Ex-Partner:   . Emotionally Abused:   [name] Physically Abused:   . Sexually Abused:    Family History  Problem Relation Age of Onset  . Aneurysm Mother   . Alzheimer's disease Father   . Cancer Sister        ovarian  . Stroke Brother   . Hypertension Brother   . Colon cancer Neg Hx   . Esophageal cancer Neg Hx   . Stomach cancer Neg Hx   . Rectal cancer Neg Hx   . Colon polyps Neg Hx      Review of Systems  All other systems reviewed and are negative.      Objective:   Physical Exam Vitals reviewed.  Constitutional:      General: He is not in acute distress.    Appearance: He is well-developed. He is not diaphoretic.  HENT:  Head: Normocephalic and atraumatic.     Right Ear: External ear normal.     Left Ear: External ear normal.     Nose: Nose normal.     Mouth/Throat:     Pharynx: No oropharyngeal exudate.  Eyes:     General: No scleral icterus.       Right eye: No discharge.        Left eye: No discharge.     Conjunctiva/sclera: Conjunctivae normal.     Pupils: Pupils are equal, round, and reactive to light.  Neck:     Thyroid: No thyromegaly.     Vascular: No JVD.  Cardiovascular:     Rate and  Rhythm: Normal rate and regular rhythm.     Heart sounds: Normal heart sounds. No murmur heard.  No friction rub. No gallop.   Pulmonary:     Effort: Pulmonary effort is normal. No respiratory distress.     Breath sounds: Normal breath sounds. No stridor. No wheezing or rales.  Chest:     Chest wall: No tenderness.  Abdominal:     General: Bowel sounds are normal. There is no distension.     Palpations: Abdomen is soft. There is no mass.     Tenderness: There is no abdominal tenderness. There is no guarding or rebound.  Musculoskeletal:        General: No tenderness. Normal range of motion.     Cervical back: Normal range of motion and neck supple.  Lymphadenopathy:     Cervical: No cervical adenopathy.  Skin:    General: Skin is warm.     Coloration: Skin is not pale.     Findings: No erythema or rash.  Neurological:     Mental Status: He is alert and oriented to person, place, and time.     Cranial Nerves: No cranial nerve deficit.     Motor: No abnormal muscle tone.     Coordination: Coordination normal.     Deep Tendon Reflexes: Reflexes are normal and symmetric.  Psychiatric:        Behavior: Behavior normal.        Thought Content: Thought content normal.        Judgment: Judgment normal.           Assessment & Plan:  Irritable bowel syndrome, unspecified type - Plan: ALPRAZolam (XANAX) 0.25 MG tablet  Other specified hypothyroidism - Plan: levothyroxine (SYNTHROID) 100 MCG tablet  Physical exam today is outstanding.  Lab work is outstanding.  Immunizations are up-to-date.  Cancer screening is up-to-date and normal.  He denies any falls, memory loss, or depression.  I suspect that his fatigue is most likely due to insomnia.  I would like him to try taking Xanax 0.25 mg every night prior to bed for the next 1 to 2 weeks to see if improved sleep helps treat his fatigue.  If so we could consider using Ambien or trazodone on an as needed basis.  Other possibilities  include low testosterone.

## 2019-12-08 ENCOUNTER — Other Ambulatory Visit: Payer: Self-pay

## 2019-12-08 DIAGNOSIS — E038 Other specified hypothyroidism: Secondary | ICD-10-CM

## 2019-12-08 MED ORDER — LEVOTHYROXINE SODIUM 100 MCG PO TABS: 100.0000 ug | ORAL_TABLET | Freq: Every day | ORAL | 3 refills | Status: DC

## 2020-01-18 ENCOUNTER — Ambulatory Visit (INDEPENDENT_AMBULATORY_CARE_PROVIDER_SITE_OTHER): Payer: Medicare Other | Admitting: Family Medicine

## 2020-01-18 ENCOUNTER — Other Ambulatory Visit: Payer: Self-pay

## 2020-01-18 VITALS — BP 134/80 | HR 80 | Temp 97.1°F | Ht 76.0 in | Wt 192.0 lb

## 2020-01-18 DIAGNOSIS — Z0120 Encounter for dental examination and cleaning without abnormal findings: Secondary | ICD-10-CM | POA: Diagnosis not present

## 2020-01-18 DIAGNOSIS — R05 Cough: Secondary | ICD-10-CM

## 2020-01-18 DIAGNOSIS — R053 Chronic cough: Secondary | ICD-10-CM

## 2020-01-18 MED ORDER — PANTOPRAZOLE SODIUM 40 MG PO TBEC
40.0000 mg | DELAYED_RELEASE_TABLET | Freq: Every day | ORAL | 3 refills | Status: DC
Start: 2020-01-18 — End: 2020-04-10

## 2020-01-18 NOTE — Progress Notes (Signed)
Subjective:    Patient ID: Carlos Choi, male    DOB: Sep 04, 1945, 74 y.o.   MRN: [id number]  HPI Patient reports a dry cough for more than 7 weeks.  He states that he does not feel sick.  He denies any fever or chills.  He denies any chest pain.  He denies any pleurisy or hemoptysis.  However 5-6 times a day he will develop a coughing spell.  Occasionally he will produce clear sputum.  He denies any wheezing.  He denies any rhinorrhea.  He denies any itchy watery eyes or sneezing or allergy symptoms.  He has recently experienced heartburn and occasionally he will have food reflux into his throat without any indigestion.  The cough is not worse when he lies down.  He denies any edema or swelling orthopnea. Past Medical History:  Diagnosis Date  . Allergy   . Anxiety   . Arthritis    mild ankles   . Cataract    removed left eye, forming right eye   . Hemorrhoids   . Hypothyroid   . Rectal bleeding    due to hemorrhoid    Past Surgical History:  Procedure Laterality Date  . CATARACT EXTRACTION Left   . COLONOSCOPY    . HERNIA REPAIR     Over ten years ago per patient. He was not sure of the date.  [name] POLYPECTOMY    . ROTATOR CUFF REPAIR     both shoulders - patient does not remember the exact date   Current Outpatient Medications on File Prior to Visit  Medication Sig Dispense Refill  . ALPRAZolam (XANAX) 0.25 MG tablet Take 1 tablet (0.25 mg total) by mouth daily as needed for anxiety. 90 tablet 0  . aspirin 81 MG tablet Take 81 mg by mouth every other day.      . ciprofloxacin (CIPRO) 500 MG tablet Take 1 tablet (500 mg total) by mouth 2 (two) times daily. (Patient not taking: Reported on 12/02/2019) 20 tablet 0  . Lactobacillus-Inulin ([place] PO) Take by mouth.    . levothyroxine (SYNTHROID) 100 MCG tablet Take 1 tablet (100 mcg total) by mouth daily. 90 tablet 3  . loratadine (CLARITIN) 10 MG tablet Take 10 mg by mouth daily.    . Multiple  Vitamins-Minerals (CENTRUM SILVER PO) Take 1 tablet by mouth daily.     . Omega-3 Fatty Acids (FISH OIL) 1000 MG CAPS Take 3 capsules by mouth daily.      No current facility-administered medications on file prior to visit.   No Known Allergies Social History   Socioeconomic History  . Marital status: Married    Spouse name: Not on file  . Number of children: Not on file  . Years of education: Not on file  . Highest education level: Not on file  Occupational History  . Not on file  Tobacco Use  . Smoking status: Former Smoker    Quit date: 06/13/1971    Years since quitting: 48.6  . Smokeless tobacco: Never Used  Substance and Sexual Activity  . Alcohol use: Yes    Comment: 2 beers Monthly.  . Drug use: No  . Sexual activity: Not on file  Other Topics Concern  . Not on file  Social History Narrative  . Not on file   Social Determinants of Health   Financial Resource Strain:   . Difficulty of Paying Living Expenses:   Food Insecurity:   . Worried About [employer] of  Food in the Last Year:   . [place] in the Last Year:   Transportation Needs:   . Film/video editor (Medical):   [name] Lack of Transportation (Non-Medical):   Physical Activity:   . Days of Exercise per Week:   . Minutes of Exercise per Session:   Stress:   . Feeling of Stress :   Social Connections:   . Frequency of Communication with Friends and Family:   . Frequency of Social Gatherings with Friends and Family:   . Attends Religious Services:   . Active Member of Clubs or Organizations:   . Attends Archivist Meetings:   [name] Marital Status:   Intimate Partner Violence:   . Fear of Current or Ex-Partner:   . Emotionally Abused:   [name] Physically Abused:   . Sexually Abused:      Review of Systems  All other systems reviewed and are negative.      Objective:   Physical Exam Vitals reviewed.  Constitutional:      Appearance: Normal appearance. He is normal weight.  HENT:       Right Ear: Tympanic membrane and ear canal normal.     Left Ear: Tympanic membrane and ear canal normal.     Nose: Nose normal. No congestion or rhinorrhea.     Mouth/Throat:     Mouth: Mucous membranes are moist.     Pharynx: No oropharyngeal exudate or posterior oropharyngeal erythema.  Cardiovascular:     Rate and Rhythm: Normal rate and regular rhythm.     Heart sounds: Normal heart sounds.  Pulmonary:     Effort: Pulmonary effort is normal. No respiratory distress.     Breath sounds: Normal breath sounds. No stridor. No wheezing, rhonchi or rales.  Chest:     Chest wall: No tenderness.  Lymphadenopathy:     Cervical: No cervical adenopathy.  Neurological:     Mental Status: He is alert.           Assessment & Plan:  Chronic cough - Plan: DG Chest 2 View  I suspect laryngoesophageal reflux.  Other potential causes would be postnasal drip.  I doubt asthma as he has no history of this.  Obtain a chest x-ray given the duration of symptoms and empirically start the patient on Protonix 40 mg a day.  Recheck to see if symptoms have improved in 2 to 3 weeks.

## 2020-01-20 ENCOUNTER — Other Ambulatory Visit: Payer: Self-pay | Admitting: Family Medicine

## 2020-01-20 ENCOUNTER — Other Ambulatory Visit: Payer: Self-pay

## 2020-01-20 ENCOUNTER — Ambulatory Visit
Admission: RE | Admit: 2020-01-20 | Discharge: 2020-01-20 | Disposition: A | Discharge status: Home / Self Care | Payer: Medicare Other | Source: Ambulatory Visit | Attending: Family Medicine | Admitting: Family Medicine

## 2020-01-20 DIAGNOSIS — R053 Chronic cough: Secondary | ICD-10-CM

## 2020-01-20 DIAGNOSIS — R911 Solitary pulmonary nodule: Secondary | ICD-10-CM

## 2020-01-21 ENCOUNTER — Telehealth (INDEPENDENT_AMBULATORY_CARE_PROVIDER_SITE_OTHER): Payer: Medicare Other | Admitting: Family Medicine

## 2020-01-21 DIAGNOSIS — R911 Solitary pulmonary nodule: Secondary | ICD-10-CM | POA: Diagnosis not present

## 2020-01-21 NOTE — Progress Notes (Signed)
Subjective:    Patient ID: Carlos Choi, male    DOB: Oct 29, 1945, 74 y.o.   MRN: [id number]  HPI  01/18/20 Patient reports a dry cough for more than 7 weeks.  He states that he does not feel sick.  He denies any fever or chills.  He denies any chest pain.  He denies any pleurisy or hemoptysis.  However 5-6 times a day he will develop a coughing spell.  Occasionally he will produce clear sputum.  He denies any wheezing.  He denies any rhinorrhea.  He denies any itchy watery eyes or sneezing or allergy symptoms.  He has recently experienced heartburn and occasionally he will have food reflux into his throat without any indigestion.  The cough is not worse when he lies down.  He denies any edema or swelling orthopnea.  At that time, my plan was: I suspect laryngoesophageal reflux.  Other potential causes would be postnasal drip.  I doubt asthma as he has no history of this.  Obtain a chest x-ray given the duration of symptoms and empirically start the patient on Protonix 40 mg a day.  Recheck to see if symptoms have improved in 2 to 3 weeks.  01/21/20 Unfortunately, chest x-ray showed a possible opacity adjacent to the heart in the left lung.  Differential diagnosis includes inflammation, infection, however malignancy cannot be excluded.  Therefore I recommended a CT scan to evaluate further.  Given the chronicity of the cough I doubt infection.  Patient is otherwise asymptomatic.  Obviously, the patient is concerned and therefore he wanted to discuss this further.  Patient is being seen today as a telephone visit.  He consents to be seen via telephone.  Phone call began at 1229.  Phone call concluded at 1242.  Patient is at home.  I am in my office. Past Medical History:  Diagnosis Date  . Allergy   . Anxiety   . Arthritis    mild ankles   . Cataract    removed left eye, forming right eye   . Hemorrhoids   . Hypothyroid   . Rectal bleeding    due to hemorrhoid    Past Surgical History:    Procedure Laterality Date  . CATARACT EXTRACTION Left   . COLONOSCOPY    . HERNIA REPAIR     Over ten years ago per patient. He was not sure of the date.  [name] POLYPECTOMY    . ROTATOR CUFF REPAIR     both shoulders - patient does not remember the exact date   Current Outpatient Medications on File Prior to Visit  Medication Sig Dispense Refill  . ALPRAZolam (XANAX) 0.25 MG tablet Take 1 tablet (0.25 mg total) by mouth daily as needed for anxiety. 90 tablet 0  . aspirin 81 MG tablet Take 81 mg by mouth every other day.      . ciprofloxacin (CIPRO) 500 MG tablet Take 1 tablet (500 mg total) by mouth 2 (two) times daily. 20 tablet 0  . Lactobacillus-Inulin ([place] PO) Take by mouth.    . levothyroxine (SYNTHROID) 100 MCG tablet Take 1 tablet (100 mcg total) by mouth daily. 90 tablet 3  . loratadine (CLARITIN) 10 MG tablet Take 10 mg by mouth daily.    . Multiple Vitamins-Minerals (CENTRUM SILVER PO) Take 1 tablet by mouth daily.     . Omega-3 Fatty Acids (FISH OIL) 1000 MG CAPS Take 3 capsules by mouth daily.     . pantoprazole (PROTONIX)  40 MG tablet Take 1 tablet (40 mg total) by mouth daily. 30 tablet 3   No current facility-administered medications on file prior to visit.   No Known Allergies Social History   Socioeconomic History  . Marital status: Married    Spouse name: Not on file  . Number of children: Not on file  . Years of education: Not on file  . Highest education level: Not on file  Occupational History  . Not on file  Tobacco Use  . Smoking status: Former Smoker    Quit date: 06/13/1971    Years since quitting: 48.6  . Smokeless tobacco: Never Used  Substance and Sexual Activity  . Alcohol use: Yes    Comment: 2 beers Monthly.  . Drug use: No  . Sexual activity: Not on file  Other Topics Concern  . Not on file  Social History Narrative  . Not on file   Social Determinants of Health   Financial Resource Strain:   . Difficulty of  Paying Living Expenses:   Food Insecurity:   . Worried About Charity fundraiser in the Last Year:   . Arboriculturist in the Last Year:   Transportation Needs:   . Film/video editor (Medical):   [name] Lack of Transportation (Non-Medical):   Physical Activity:   . Days of Exercise per Week:   . Minutes of Exercise per Session:   Stress:   . Feeling of Stress :   Social Connections:   . Frequency of Communication with Friends and Family:   . Frequency of Social Gatherings with Friends and Family:   . Attends Religious Services:   . Active Member of Clubs or Organizations:   . Attends Archivist Meetings:   [name] Marital Status:   Intimate Partner Violence:   . Fear of Current or Ex-Partner:   . Emotionally Abused:   [name] Physically Abused:   . Sexually Abused:      Review of Systems  All other systems reviewed and are negative.      Objective:   Physical Exam        Assessment & Plan:  Solitary pulmonary nodule  Try to schedule CT scan as soon as possible to decide if this is an underlying mass versus infection versus inflammation.  At the present time continue Protonix in case the patient is having laryngoesophageal reflux until we know more about the nature of this opacity

## 2020-01-25 ENCOUNTER — Other Ambulatory Visit: Payer: Self-pay

## 2020-01-26 ENCOUNTER — Encounter (HOSPITAL_COMMUNITY): Payer: Self-pay

## 2020-01-26 ENCOUNTER — Other Ambulatory Visit: Payer: Self-pay

## 2020-01-26 ENCOUNTER — Ambulatory Visit (HOSPITAL_COMMUNITY)
Admission: RE | Admit: 2020-01-26 | Discharge: 2020-01-26 | Disposition: A | Discharge status: Home / Self Care | Payer: Medicare Other | Source: Ambulatory Visit | Attending: Family Medicine | Admitting: Family Medicine

## 2020-01-26 DIAGNOSIS — J479 Bronchiectasis, uncomplicated: Secondary | ICD-10-CM | POA: Diagnosis not present

## 2020-01-26 DIAGNOSIS — E039 Hypothyroidism, unspecified: Secondary | ICD-10-CM

## 2020-01-26 DIAGNOSIS — R911 Solitary pulmonary nodule: Secondary | ICD-10-CM | POA: Diagnosis not present

## 2020-01-26 DIAGNOSIS — M47814 Spondylosis without myelopathy or radiculopathy, thoracic region: Secondary | ICD-10-CM | POA: Diagnosis not present

## 2020-01-26 DIAGNOSIS — R918 Other nonspecific abnormal finding of lung field: Secondary | ICD-10-CM | POA: Diagnosis not present

## 2020-01-26 DIAGNOSIS — I2510 Atherosclerotic heart disease of native coronary artery without angina pectoris: Secondary | ICD-10-CM | POA: Diagnosis not present

## 2020-01-26 LAB — POCT I-STAT CREATININE: Creatinine, Ser: 1 mg/dL (ref 0.61–1.24)

## 2020-01-26 MED ORDER — IOHEXOL 300 MG/ML  SOLN
75.0000 mL | Freq: Once | INTRAMUSCULAR | Status: AC | PRN
  Administered 2020-01-26: 75 mL via INTRAVENOUS

## 2020-01-26 MED ORDER — SODIUM CHLORIDE (PF) 0.9 % IJ SOLN
INTRAMUSCULAR | Status: AC
  Filled 2020-01-26: qty 50

## 2020-01-27 ENCOUNTER — Other Ambulatory Visit: Payer: Self-pay | Admitting: Family Medicine

## 2020-01-28 ENCOUNTER — Other Ambulatory Visit: Payer: Self-pay

## 2020-01-28 ENCOUNTER — Ambulatory Visit (INDEPENDENT_AMBULATORY_CARE_PROVIDER_SITE_OTHER): Payer: Medicare Other | Admitting: Family Medicine

## 2020-01-28 VITALS — BP 110/80 | HR 80 | Temp 97.9°F | Ht 76.0 in | Wt 192.0 lb

## 2020-01-28 DIAGNOSIS — J984 Other disorders of lung: Secondary | ICD-10-CM | POA: Diagnosis not present

## 2020-01-28 DIAGNOSIS — R911 Solitary pulmonary nodule: Secondary | ICD-10-CM | POA: Diagnosis not present

## 2020-01-28 MED ORDER — AMOXICILLIN-POT CLAVULANATE ER 1000-62.5 MG PO TB12: 2.0000 | ORAL_TABLET | Freq: Two times a day (BID) | ORAL | 0 refills | Status: DC

## 2020-01-28 MED ORDER — AZITHROMYCIN 250 MG PO TABS: ORAL_TABLET | ORAL | 0 refills | Status: DC

## 2020-01-28 NOTE — Progress Notes (Signed)
Subjective:    Patient ID: Carlos Choi, male    DOB: [birth year], 74 y.o.   MRN: [id number]  HPI  01/18/20 Patient reports a dry cough for more than 7 weeks.  Carlos Choi states that Carlos Choi does not feel sick.  Carlos Choi denies any fever or chills.  Carlos Choi denies any chest pain.  Carlos Choi denies any pleurisy or hemoptysis.  However 5-6 times a day Carlos Choi will develop a coughing spell.  Occasionally Carlos Choi will produce clear sputum.  Carlos Choi denies any wheezing.  Carlos Choi denies any rhinorrhea.  Carlos Choi denies any itchy watery eyes or sneezing or allergy symptoms.  Carlos Choi has recently experienced heartburn and occasionally Carlos Choi will have food reflux into his throat without any indigestion.  The cough is not worse when Carlos Choi lies down.  Carlos Choi denies any edema or swelling orthopnea.  At that time, my plan was: I suspect laryngoesophageal reflux.  Other potential causes would be postnasal drip.  I doubt asthma as Carlos Choi has no history of this.  Obtain a chest x-ray given the duration of symptoms and empirically start the patient on Protonix 40 mg a day.  Recheck to see if symptoms have improved in 2 to 3 weeks.  01/28/20 Ultimately, the patient underwent a CT scan of the lungs due to an opacity seen in the left lung.  The results are included below: IMPRESSION: 1. There is a nodular opacity in the anterior segment of the right upper lobe measuring 8 x 7 mm with central cavitation. There is a nodular opacity in the posterior segment of the left upper lobe measuring 8 x 7 mm. There is tree on bud type appearance in the posterior segment of the right upper lobe as well as a somewhat similar appearance in the anterior segment of the left upper lobe medially, likely foci of pneumonia.  The nodular appearing opacities warrant additional surveillance. Non-contrast chest CT at 3-6 months is recommended. If the nodules are stable at time of repeat CT, then future CT at 18-24 months (from today's scan) is considered optional for low-risk patients, but is recommended for  high-risk patients. This recommendation follows the consensus statement: Guidelines for Management of Incidental Pulmonary Nodules Detected on CT Images: From the Fleischner Society 2017; Radiology 2017; 284:228-243. The cavitation in the right upper lobe nodular lesion may warrant somewhat earlier CT surveillance. Both infection and early neoplasm could present in this manner.  2. There is chronic appearing scarring and localized bronchiectatic change in the inferior lingula, also present on previous CT.  3. There are scattered areas of mosaic attenuation in the lower lobes and lateral segment right middle lobe. Suspect a degree of underlying small airways obstructive disease.  4.   No evident thoracic adenopathy.  5.   There are foci of coronary artery calcification.  6.    Hepatic steatosis. Patient is here today to discuss the results.  Carlos Choi continues to have a persistent cough.  Carlos Choi also reports rhinorrhea now.  Carlos Choi reports brown nasal discharge.  Occasionally Carlos Choi is coughing up brown mucus.  This is been a change since his last visit and Carlos Choi assumed it was due to the Protonix.  Carlos Choi denies any fevers or chills or chest pain.  Carlos Choi denies any rash.  Carlos Choi denies any bleeding or bruising.  Carlos Choi denies any night sweats. Past Medical History:  Diagnosis Date  . Allergy   . Anxiety   . Arthritis    mild ankles   . Cataract    removed left  eye, forming right eye   . Hemorrhoids   . Hypothyroid   . Rectal bleeding    due to hemorrhoid    Past Surgical History:  Procedure Laterality Date  . CATARACT EXTRACTION Left   . COLONOSCOPY    . HERNIA REPAIR     Over ten years ago per patient. Carlos Choi was not sure of the date.  [name] POLYPECTOMY    . ROTATOR CUFF REPAIR     both shoulders - patient does not remember the exact date   Current Outpatient Medications on File Prior to Visit  Medication Sig Dispense Refill  . ALPRAZolam (XANAX) 0.25 MG tablet Take 1 tablet (0.25 mg total) by mouth daily as  needed for anxiety. 90 tablet 0  . aspirin 81 MG tablet Take 81 mg by mouth every other day.      . ciprofloxacin (CIPRO) 500 MG tablet Take 1 tablet (500 mg total) by mouth 2 (two) times daily. 20 tablet 0  . Lactobacillus-Inulin ([place] PO) Take by mouth.    . levothyroxine (SYNTHROID) 100 MCG tablet Take 1 tablet (100 mcg total) by mouth daily. 90 tablet 3  . loratadine (CLARITIN) 10 MG tablet Take 10 mg by mouth daily.    . Multiple Vitamins-Minerals (CENTRUM SILVER PO) Take 1 tablet by mouth daily.     . Omega-3 Fatty Acids (FISH OIL) 1000 MG CAPS Take 3 capsules by mouth daily.     . pantoprazole (PROTONIX) 40 MG tablet Take 1 tablet (40 mg total) by mouth daily. 30 tablet 3   No current facility-administered medications on file prior to visit.   No Known Allergies Social History   Socioeconomic History  . Marital status: Married    Spouse name: Not on file  . Number of children: Not on file  . Years of education: Not on file  . Highest education level: Not on file  Occupational History  . Not on file  Tobacco Use  . Smoking status: Former Smoker    Quit date: 06/13/1971    Years since quitting: 48.6  . Smokeless tobacco: Never Used  Substance and Sexual Activity  . Alcohol use: Yes    Comment: 2 beers Monthly.  . Drug use: No  . Sexual activity: Not on file  Other Topics Concern  . Not on file  Social History Narrative  . Not on file   Social Determinants of Health   Financial Resource Strain:   . Difficulty of Paying Living Expenses:   Food Insecurity:   . Worried About Charity fundraiser in the Last Year:   . Arboriculturist in the Last Year:   Transportation Needs:   . Film/video editor (Medical):   [name] Lack of Transportation (Non-Medical):   Physical Activity:   . Days of Exercise per Week:   . Minutes of Exercise per Session:   Stress:   . Feeling of Stress :   Social Connections:   . Frequency of Communication with Friends  and Family:   . Frequency of Social Gatherings with Friends and Family:   . Attends Religious Services:   . Active Member of Clubs or Organizations:   . Attends Archivist Meetings:   [name] Marital Status:   Intimate Partner Violence:   . Fear of Current or Ex-Partner:   . Emotionally Abused:   [name] Physically Abused:   . Sexually Abused:      Review of Systems  All other systems reviewed  and are negative.      Objective:   Physical Exam Vitals reviewed.  Constitutional:      Appearance: Normal appearance. Carlos Choi is normal weight.  HENT:     Right Ear: Tympanic membrane and ear canal normal.     Left Ear: Tympanic membrane and ear canal normal.     Nose: Nose normal. No congestion or rhinorrhea.     Mouth/Throat:     Mouth: Mucous membranes are moist.     Pharynx: No oropharyngeal exudate or posterior oropharyngeal erythema.  Cardiovascular:     Rate and Rhythm: Normal rate and regular rhythm.     Heart sounds: Normal heart sounds.  Pulmonary:     Effort: Pulmonary effort is normal. No respiratory distress.     Breath sounds: Normal breath sounds. No stridor. No wheezing, rhonchi or rales.  Chest:     Chest wall: No tenderness.  Lymphadenopathy:     Cervical: No cervical adenopathy.  Neurological:     Mental Status: Carlos Choi is alert.           Assessment & Plan:  Cavitary lesion of lung - Plan: QuantiFERON-TB Gold Plus, ANCA Screen Reflex Titer  Differential diagnosis includes infections such as streptococcal pneumonia, atypical pneumonia, Mycobacterium.  Granulomatous disease such as sarcoidosis or vasculitis would also be on the differential.  Particular given the sinus involvement, Wegener's disease is a possibility.  Fungal disease is also on the differential diagnosis however the patient has no known exposure.  Malignancy is less likely.  Therefore we have determined to treat the patient for community-acquired pneumonia with Augmentin extended release 2000 mg twice  daily.  Will cover atypical pneumonias with a Z-Pak.  I would repeat CT scan of the lungs in 4 to 6 weeks.  If the lesion is persistent, the lesion in the left lung may be amenable to bronchoscopy and needle biopsy given its location.  I will check QuantiFERON gold titers to evaluate for possible Mycobacterium.  I will check an ANCA panel to evaluate for Wegener's disease.

## 2020-01-30 LAB — QUANTIFERON-TB GOLD PLUS
Mitogen-NIL: >10 IU/mL
NIL: 0.03 IU/mL
QuantiFERON-TB Gold Plus: NEGATIVE
TB1-NIL: 0 IU/mL
TB2-NIL: 0 IU/mL

## 2020-01-31 ENCOUNTER — Telehealth: Payer: Self-pay | Admitting: Family Medicine

## 2020-01-31 LAB — ANCA SCREEN W REFLEX TITER: ANCA Screen: NEGATIVE

## 2020-01-31 NOTE — Telephone Encounter (Signed)
Patient called and states that [facility] on [address]. Has questions about augmentin XR. He states that they are telling him that his insurance is kicking it back. He states that they have tried contacting our office as well about questions they have.  CB# [phone]

## 2020-02-01 ENCOUNTER — Other Ambulatory Visit: Payer: Medicare Other

## 2020-02-01 DIAGNOSIS — D1801 Hemangioma of skin and subcutaneous tissue: Secondary | ICD-10-CM | POA: Diagnosis not present

## 2020-02-01 DIAGNOSIS — L821 Other seborrheic keratosis: Secondary | ICD-10-CM | POA: Diagnosis not present

## 2020-02-01 DIAGNOSIS — L570 Actinic keratosis: Secondary | ICD-10-CM | POA: Diagnosis not present

## 2020-02-01 NOTE — Telephone Encounter (Signed)
Spoke with Pt yesterday about his medication

## 2020-02-02 DIAGNOSIS — R972 Elevated prostate specific antigen [PSA]: Secondary | ICD-10-CM | POA: Diagnosis not present

## 2020-02-02 DIAGNOSIS — N401 Enlarged prostate with lower urinary tract symptoms: Secondary | ICD-10-CM | POA: Diagnosis not present

## 2020-02-02 DIAGNOSIS — R351 Nocturia: Secondary | ICD-10-CM | POA: Diagnosis not present

## 2020-02-02 DIAGNOSIS — R3912 Poor urinary stream: Secondary | ICD-10-CM | POA: Diagnosis not present

## 2020-02-17 DIAGNOSIS — R7303 Prediabetes: Secondary | ICD-10-CM | POA: Diagnosis not present

## 2020-02-17 DIAGNOSIS — E7800 Pure hypercholesterolemia, unspecified: Secondary | ICD-10-CM | POA: Diagnosis not present

## 2020-02-17 DIAGNOSIS — Z1211 Encounter for screening for malignant neoplasm of colon: Secondary | ICD-10-CM | POA: Diagnosis not present

## 2020-02-17 DIAGNOSIS — I10 Essential (primary) hypertension: Secondary | ICD-10-CM | POA: Diagnosis not present

## 2020-02-17 DIAGNOSIS — N5203 Combined arterial insufficiency and corporo-venous occlusive erectile dysfunction: Secondary | ICD-10-CM | POA: Diagnosis not present

## 2020-02-17 DIAGNOSIS — R7309 Other abnormal glucose: Secondary | ICD-10-CM | POA: Diagnosis not present

## 2020-03-08 DIAGNOSIS — Z23 Encounter for immunization: Secondary | ICD-10-CM | POA: Diagnosis not present

## 2020-03-27 ENCOUNTER — Ambulatory Visit
Admission: RE | Admit: 2020-03-27 | Discharge: 2020-03-27 | Disposition: A | Discharge status: Home / Self Care | Payer: Medicare Other | Source: Ambulatory Visit | Attending: Family Medicine | Admitting: Family Medicine

## 2020-03-27 DIAGNOSIS — R911 Solitary pulmonary nodule: Secondary | ICD-10-CM

## 2020-03-27 DIAGNOSIS — J984 Other disorders of lung: Secondary | ICD-10-CM | POA: Diagnosis not present

## 2020-03-27 DIAGNOSIS — I2510 Atherosclerotic heart disease of native coronary artery without angina pectoris: Secondary | ICD-10-CM | POA: Diagnosis not present

## 2020-03-27 DIAGNOSIS — J8410 Pulmonary fibrosis, unspecified: Secondary | ICD-10-CM | POA: Diagnosis not present

## 2020-03-27 MED ORDER — IOPAMIDOL (ISOVUE-300) INJECTION 61%
75.0000 mL | Freq: Once | INTRAVENOUS | Status: AC | PRN
  Administered 2020-03-27: 75 mL via INTRAVENOUS

## 2020-03-28 ENCOUNTER — Encounter: Payer: Self-pay | Admitting: Family Medicine

## 2020-03-31 ENCOUNTER — Telehealth: Payer: Self-pay

## 2020-04-03 ENCOUNTER — Other Ambulatory Visit: Payer: Self-pay

## 2020-04-03 ENCOUNTER — Other Ambulatory Visit: Payer: Self-pay | Admitting: Family Medicine

## 2020-04-03 ENCOUNTER — Encounter: Payer: Self-pay | Admitting: Family Medicine

## 2020-04-03 ENCOUNTER — Telehealth: Payer: Medicare Other | Admitting: Family Medicine

## 2020-04-03 DIAGNOSIS — J984 Other disorders of lung: Secondary | ICD-10-CM

## 2020-04-03 DIAGNOSIS — K589 Irritable bowel syndrome without diarrhea: Secondary | ICD-10-CM

## 2020-04-03 MED ORDER — ALPRAZOLAM 0.25 MG PO TABS: 0.2500 mg | ORAL_TABLET | Freq: Every day | ORAL | 0 refills | Status: DC | PRN

## 2020-04-03 NOTE — Progress Notes (Signed)
Subjective:    Patient ID: Carlos Choi, male    DOB: Jul 31, 1945, 74 y.o.   MRN: [id number]  HPI  01/18/20 Patient reports a dry cough for more than 7 weeks.  He states that he does not feel sick.  He denies any fever or chills.  He denies any chest pain.  He denies any pleurisy or hemoptysis.  However 5-6 times a day he will develop a coughing spell.  Occasionally he will produce clear sputum.  He denies any wheezing.  He denies any rhinorrhea.  He denies any itchy watery eyes or sneezing or allergy symptoms.  He has recently experienced heartburn and occasionally he will have food reflux into his throat without any indigestion.  The cough is not worse when he lies down.  He denies any edema or swelling orthopnea.  At that time, my plan was: I suspect laryngoesophageal reflux.  Other potential causes would be postnasal drip.  I doubt asthma as he has no history of this.  Obtain a chest x-ray given the duration of symptoms and empirically start the patient on Protonix 40 mg a day.  Recheck to see if symptoms have improved in 2 to 3 weeks.  01/28/20 Ultimately, the patient underwent a CT scan of the lungs due to an opacity seen in the left lung.  The results are included below: IMPRESSION: 1. There is a nodular opacity in the anterior segment of the right upper lobe measuring 8 x 7 mm with central cavitation. There is a nodular opacity in the posterior segment of the left upper lobe measuring 8 x 7 mm. There is tree on bud type appearance in the posterior segment of the right upper lobe as well as a somewhat similar appearance in the anterior segment of the left upper lobe medially, likely foci of pneumonia.  The nodular appearing opacities warrant additional surveillance. Non-contrast chest CT at 3-6 months is recommended. If the nodules are stable at time of repeat CT, then future CT at 18-24 months (from today's scan) is considered optional for low-risk patients, but is recommended for  high-risk patients. This recommendation follows the consensus statement: Guidelines for Management of Incidental Pulmonary Nodules Detected on CT Images: From the Fleischner Society 2017; Radiology 2017; 284:228-243. The cavitation in the right upper lobe nodular lesion may warrant somewhat earlier CT surveillance. Both infection and early neoplasm could present in this manner.  2. There is chronic appearing scarring and localized bronchiectatic change in the inferior lingula, also present on previous CT.  3. There are scattered areas of mosaic attenuation in the lower lobes and lateral segment right middle lobe. Suspect a degree of underlying small airways obstructive disease.  4.   No evident thoracic adenopathy.  5.   There are foci of coronary artery calcification.  6.    Hepatic steatosis. Patient is here today to discuss the results.  He continues to have a persistent cough.  He also reports rhinorrhea now.  He reports brown nasal discharge.  Occasionally he is coughing up brown mucus.  This is been a change since his last visit and he assumed it was due to the Protonix.  He denies any fevers or chills or chest pain.  He denies any rash.  He denies any bleeding or bruising.  He denies any night sweats.  At that time, my plan was: Differential diagnosis includes infections such as streptococcal pneumonia, atypical pneumonia, Mycobacterium.  Granulomatous disease such as sarcoidosis or vasculitis would also be on the  differential.  Particular given the sinus involvement, Wegener's disease is a possibility.  Fungal disease is also on the differential diagnosis however the patient has no known exposure.  Malignancy is less likely.  Therefore we have determined to treat the patient for community-acquired pneumonia with Augmentin extended release 2000 mg twice daily.  Will cover atypical pneumonias with a Z-Pak.  I would repeat CT scan of the lungs in 4 to 6 weeks.  If the lesion is  persistent, the lesion in the left lung may be amenable to bronchoscopy and needle biopsy given its location.  I will check QuantiFERON gold titers to evaluate for possible Mycobacterium.  I will check an ANCA panel to evaluate for Wegener's disease.  04/03/20 Patient states that his cough has improved.  He feels much better after taking the antibiotics.  Repeat CT scan was recently performed and is listed below:  Unchanged small cavitary nodule of the right pulmonary apex measuring 0.8 x 0.6 cm (series 5, image 37). Interval improvement of an irregular nodule of the posterior left upper lobe, measuring approximately 0.7 x 0.6 cm, previously 1.0 x 0.7 cm (series 5, image 69). Slight interval improvement in areas of clustered centrilobular and tree-in-bud nodularity throughout the lungs, for example in the inferior right upper lobe (series 5, image 79) and in the dependent left lung base (series 5, image 125). Unchanged 6 mm nodule of the dependent left lung base, stable compared to prior CT dated 08/01/2015 and definitively benign (series 5, image 127). Fibrotic scarring and volume loss of the posterior lingula (series 5, image 117). No pleural effusion or pneumothorax.  Upper Abdomen: No acute abnormality.  Musculoskeletal: No chest wall mass or suspicious bone lesions identified.  IMPRESSION: 1. Unchanged small cavitary nodule of the right pulmonary apex measuring 0.8 x 0.6 cm. Although this remains nonspecific, given morphology recommend additional follow-up in 6-12 months to ensure stability. 2. Slight interval improvement in areas of clustered centrilobular and tree-in-bud nodularity throughout the lungs, as well as an irregular nodule of the posterior left upper lobe, findings consistent with improved atypical infection, particularly atypical Mycobacterium.  He is being seen today by telephone to discuss the results.  Patient is at home.  I am currently in my office.   Patient consents to be seen via telephone.  Phone call began at 1015.  Phone call concluded at 1030.  I do not feel that the lesion is malignant.  However I do believe he is most likely been exposed to some type of atypical infection from a Mycobacterium such as Mycobacterium avium complex etc.  Patient denies any further coughing.  He denies any shortness of breath or hemoptysis or fever or chills or night sweats or pleurisy.  The antibiotics seem to help dramatically Past Medical History:  Diagnosis Date  . Allergy   . Anxiety   . Arthritis    mild ankles   . Cataract    removed left eye, forming right eye   . Hemorrhoids   . Hypothyroid   . Rectal bleeding    due to hemorrhoid    Past Surgical History:  Procedure Laterality Date  . CATARACT EXTRACTION Left   . COLONOSCOPY    . HERNIA REPAIR     Over ten years ago per patient. He was not sure of the date.  [name] POLYPECTOMY    . ROTATOR CUFF REPAIR     both shoulders - patient does not remember the exact date   Current Outpatient Medications on  File Prior to Visit  Medication Sig Dispense Refill  . amoxicillin-clavulanate (AUGMENTIN XR) 1000-62.5 MG 12 hr tablet Take 2 tablets by mouth 2 (two) times daily. 28 tablet 0  . aspirin 81 MG tablet Take 81 mg by mouth every other day.      [name] azithromycin (ZITHROMAX) 250 MG tablet 2 tabs poqday1, 1 tab poqday 2-5 6 tablet 0  . ciprofloxacin (CIPRO) 500 MG tablet Take 1 tablet (500 mg total) by mouth 2 (two) times daily. 20 tablet 0  . Lactobacillus-Inulin ([place] PO) Take by mouth.    . levothyroxine (SYNTHROID) 100 MCG tablet Take 1 tablet (100 mcg total) by mouth daily. 90 tablet 3  . loratadine (CLARITIN) 10 MG tablet Take 10 mg by mouth daily.    . Multiple Vitamins-Minerals (CENTRUM SILVER PO) Take 1 tablet by mouth daily.     . Omega-3 Fatty Acids (FISH OIL) 1000 MG CAPS Take 3 capsules by mouth daily.     . pantoprazole (PROTONIX) 40 MG tablet Take 1 tablet (40 mg  total) by mouth daily. 30 tablet 3   No current facility-administered medications on file prior to visit.   No Known Allergies Social History   Socioeconomic History  . Marital status: Married    Spouse name: Not on file  . Number of children: Not on file  . Years of education: Not on file  . Highest education level: Not on file  Occupational History  . Not on file  Tobacco Use  . Smoking status: Former Smoker    Quit date: 06/13/1971    Years since quitting: 48.8  . Smokeless tobacco: Never Used  Substance and Sexual Activity  . Alcohol use: Yes    Comment: 2 beers Monthly.  . Drug use: No  . Sexual activity: Not on file  Other Topics Concern  . Not on file  Social History Narrative  . Not on file   Social Determinants of Health   Financial Resource Strain:   . Difficulty of Paying Living Expenses: Not on file  Food Insecurity:   . Worried About Charity fundraiser in the Last Year: Not on file  . Ran Out of Food in the Last Year: Not on file  Transportation Needs:   . Lack of Transportation (Medical): Not on file  . Lack of Transportation (Non-Medical): Not on file  Physical Activity:   . Days of Exercise per Week: Not on file  . Minutes of Exercise per Session: Not on file  Stress:   . Feeling of Stress : Not on file  Social Connections:   . Frequency of Communication with Friends and Family: Not on file  . Frequency of Social Gatherings with Friends and Family: Not on file  . Attends Religious Services: Not on file  . Active Member of Clubs or Organizations: Not on file  . Attends Archivist Meetings: Not on file  . Marital Status: Not on file  Intimate Partner Violence:   . Fear of Current or Ex-Partner: Not on file  . Emotionally Abused: Not on file  . Physically Abused: Not on file  . Sexually Abused: Not on file     Review of Systems  All other systems reviewed and are negative.      Objective:          Assessment & Plan:    Cavitary lesion of lung - Plan: Ambulatory referral to Pulmonology  I suspect that the patient may have an atypical  infection likely due to a Mycobacterium.  However I have very little experience with this and have recommended a pulmonology consultation to determine if further treatment is necessary.  I do not feel that the lesion seen on the CT scan represents a malignancy thankfully.  I question if the patient requires prolonged antibiotics.  Also question if the patient needs bronchoscopy and culture of the fluid to confirm the diagnosis.  Therefore I would like a second opinion with pulmonology.

## 2020-04-10 ENCOUNTER — Other Ambulatory Visit: Payer: Self-pay | Admitting: Family Medicine

## 2020-05-09 DIAGNOSIS — Z23 Encounter for immunization: Secondary | ICD-10-CM | POA: Diagnosis not present

## 2020-05-18 ENCOUNTER — Ambulatory Visit (HOSPITAL_COMMUNITY)
Admission: RE | Admit: 2020-05-18 | Discharge: 2020-05-18 | Disposition: A | Discharge status: Home / Self Care | Payer: Medicare Other | Source: Ambulatory Visit | Attending: Internal Medicine | Admitting: Internal Medicine

## 2020-05-18 ENCOUNTER — Encounter: Payer: Self-pay | Admitting: Internal Medicine

## 2020-05-18 ENCOUNTER — Ambulatory Visit: Payer: Medicare Other | Admitting: Internal Medicine

## 2020-05-18 ENCOUNTER — Other Ambulatory Visit: Payer: Self-pay

## 2020-05-18 DIAGNOSIS — R918 Other nonspecific abnormal finding of lung field: Secondary | ICD-10-CM | POA: Diagnosis not present

## 2020-05-18 DIAGNOSIS — R059 Cough, unspecified: Secondary | ICD-10-CM | POA: Diagnosis not present

## 2020-05-18 NOTE — Assessment & Plan Note (Signed)
Bronchiectasis localized lingula by CT (non hrct) 01/26/20  - Quant TB/ anca serologies neg 01/28/20 and convincing clinical response to zmax/augmentin rx   The only macroscopic changes on plain cxr are explained by the localized bronchiectasis on prior ct and he'll need a f/u  @  3 m this time HRCT to further quantify the severity of bronchiectasis/ MAI - however, I assured him: This is an extremely common benign condition in the elderly and does not warrant aggressive eval/ rx at this point unless there is a clinical correlation suggesting unaddressed pulmonary infection (purulent sputum, night sweats, unintended wt loss, doe) or evolution of  obvious changes on plain cxr (as opposed to serial CT, which is way over sensitive to make clinical decisions re intervention and treatment in the elderly, who tend to tolerate both dx and treatment poorly) .  We'll do one HRCT then follow him here clinically  Discussed in detail all the  indications, usual  risks and alternatives  relative to the benefits with patient who agrees to proceed with w/u as outlined.             Each maintenance medication was reviewed in detail including emphasizing most importantly the difference between maintenance and prns and under what circumstances the prns are to be triggered using an action plan format where appropriate.  Total time for H and P, chart review, counseling,  and generating customized AVS unique to this office visit / charting = 57 min

## 2020-05-18 NOTE — Progress Notes (Signed)
Carlos Choi, male    DOB: [birth year]       MRN: [id number]   Brief patient profile:  4 yowm minimal smoking hx with acute purulent bronchitis in Late July 2021 resolved on zmax/augmentin clinically but f/u cxr/ ct concerning for cavitary nodules so referred to pulmonary clinic in [place]  05/18/2020 by Dr  Dennard Schaumann with neg Quant TB and anca aleady done       History of Present Illness  05/18/2020  Pulmonary/ 1st office eval/ Carlos Choi / [facility] Office  Chief Complaint  Patient presents with  . Pulmonary Consult    Referred by Dr. Dennard Schaumann for eval of cavitary lesion. Pt states that he has had cough since Jan 2021. This has been much improved since round of augmentin taken Aug 2021. He does have some cough in the am with clear sputum.   Dyspnea:  Not limited, very active  Cough: a little of stick white mucus in am not every day  Sleep: 30 degrees on electric SABA use: none   No obvious day to day or daytime variability or assoc excess/ purulent sputum or mucus plugs or hemoptysis or cp or chest tightness, subjective wheeze or overt sinus or hb symptoms.   Sleeping without nocturnal  or early am exacerbation  of respiratory  c/o's or need for noct saba. Also denies any obvious fluctuation of symptoms with weather or environmental changes or other aggravating or alleviating factors except as outlined above   No unusual exposure hx or h/o childhood pna/ asthma or knowledge of premature birth.  Current Allergies, Complete Past Medical History, Past Surgical History, Family History, and Social History were reviewed in [employer] record.  ROS  The following are not active complaints unless bolded Hoarseness, sore throat, dysphagia, dental problems, itching, sneezing,  nasal congestion or discharge of excess mucus or purulent secretions, ear ache,   fever, chills, sweats, unintended wt loss or wt gain, classically pleuritic or exertional cp,  orthopnea pnd or  arm/hand swelling  or leg swelling, presyncope, palpitations, abdominal pain, anorexia, nausea, vomiting, diarrhea  or change in bowel habits or change in bladder habits, change in stools or change in urine, dysuria, hematuria,  rash, arthralgias, visual complaints, headache, numbness, weakness or ataxia or problems with walking or coordination,  change in mood or  memory.           Past Medical History:  Diagnosis Date  . Allergy   . Anxiety   . Arthritis    mild ankles   . Cataract    removed left eye, forming right eye   . Hemorrhoids   . Hypothyroid   . Rectal bleeding    due to hemorrhoid     Outpatient Medications Prior to Visit  Medication Sig Dispense Refill  . ALPRAZolam (XANAX) 0.25 MG tablet Take 1 tablet (0.25 mg total) by mouth daily as needed for anxiety. 90 tablet 0  . Lactobacillus-Inulin ([place] PO) Take by mouth.    . levothyroxine (SYNTHROID) 100 MCG tablet Take 1 tablet (100 mcg total) by mouth daily. 90 tablet 3  . loratadine (CLARITIN) 10 MG tablet Take 10 mg by mouth daily.    . Multiple Vitamins-Minerals (CENTRUM SILVER PO) Take 1 tablet by mouth daily.     . Omega-3 Fatty Acids (FISH OIL) 1000 MG CAPS Take 3 capsules by mouth daily.     [name] amoxicillin-clavulanate (AUGMENTIN XR) 1000-62.5 MG 12 hr tablet Take 2 tablets by mouth 2 (two)  times daily. 28 tablet 0  . aspirin 81 MG tablet Take 81 mg by mouth every other day.      [name] azithromycin (ZITHROMAX) 250 MG tablet 2 tabs poqday1, 1 tab poqday 2-5 6 tablet 0  . ciprofloxacin (CIPRO) 500 MG tablet Take 1 tablet (500 mg total) by mouth 2 (two) times daily. 20 tablet 0  . pantoprazole (PROTONIX) 40 MG tablet TAKE 1 TABLET BY MOUTH EVERY DAY 90 tablet 0   No facility-administered medications prior to visit.     Objective:     BP 122/70 (BP Location: Left Arm, Cuff Size: Normal)   Pulse 80   Temp (!) 97.1 F (36.2 C) (Temporal)   Ht 6\' 4"  (1.93 m)   Wt 194 lb (88 kg)   SpO2 96%  Comment: on RA  BMI 23.61 kg/m   SpO2: 96 % (on RA)     Robust pleasant amb wm nad    HEENT : pt wearing mask not removed for exam due to covid -19 concerns.    NECK :  without JVD/Nodes/TM/ nl carotid upstrokes bilaterally   LUNGS: no acc muscle use,  Nl contour chest which is clear to A and P bilaterally without cough on insp or exp maneuvers   CV:  RRR  no s3 or murmur or increase in P2, and no edema   ABD:  soft and nontender with nl inspiratory excursion in the supine position. No bruits or organomegaly appreciated, bowel sounds nl  MS:  Nl gait/ ext warm without deformities, calf tenderness, cyanosis or clubbing No obvious joint restrictions   SKIN: warm and dry without lesions    NEURO:  alert, approp, nl sensorium with  no motor or cerebellar deficits apparent.     CXR PA and Lateral:   05/18/2020 :    I personally reviewed images and   impression as follows:    marked improvement of all acute changes x for the lingular density which proved to be assoc with localized bronchiectasis on prior CT     Assessment   No problem-specific Assessment & Plan notes found for this encounter.     Christinia Gully, MD 05/18/2020

## 2020-05-18 NOTE — Patient Instructions (Addendum)
We will schedule you a high resolution Ct of the chest mid Jan 2021   Bronchiectasis =   you have scarring of your bronchial tubes which means that they don't function perfectly normally and mucus tends to pool in certain areas of your lung which can cause pneumonia and further scarring of your lung and bronchial tubes  Whenever you develop cough congestion take mucinex (wet cough) or mucinex dm (dry cough) up to 1200 mg every 12hours  > these will help keep the mucus loose and flowing but if your condition worsens you need to seek help immediately preferably here or somewhere inside the [facility] system to compare xrays ( worse = darker or bloody mucus or pain on breathing in)     Please remember to go to the  x-ray department  @  [facility] for your tests - we will call you with the results when they are available      Pulmonary follow up will be as needed

## 2020-05-24 NOTE — Progress Notes (Signed)
Spoke with pt and notified of results per Dr. Wert. Pt verbalized understanding and denied any questions. 

## 2020-06-28 ENCOUNTER — Ambulatory Visit (HOSPITAL_COMMUNITY)
Admission: RE | Admit: 2020-06-28 | Discharge: 2020-06-28 | Disposition: A | Discharge status: Home / Self Care | Payer: Medicare Other | Source: Ambulatory Visit | Attending: Internal Medicine | Admitting: Internal Medicine

## 2020-06-28 ENCOUNTER — Other Ambulatory Visit: Payer: Self-pay

## 2020-06-28 DIAGNOSIS — I2510 Atherosclerotic heart disease of native coronary artery without angina pectoris: Secondary | ICD-10-CM | POA: Diagnosis not present

## 2020-06-28 DIAGNOSIS — R918 Other nonspecific abnormal finding of lung field: Secondary | ICD-10-CM | POA: Diagnosis not present

## 2020-06-28 DIAGNOSIS — J8410 Pulmonary fibrosis, unspecified: Secondary | ICD-10-CM | POA: Diagnosis not present

## 2020-06-28 DIAGNOSIS — J984 Other disorders of lung: Secondary | ICD-10-CM | POA: Diagnosis not present

## 2020-06-28 DIAGNOSIS — R059 Cough, unspecified: Secondary | ICD-10-CM | POA: Diagnosis not present

## 2020-06-30 NOTE — Progress Notes (Signed)
Tried calling pt, line rang multiple times with no answer and no VM, WCB.

## 2020-07-03 NOTE — Progress Notes (Signed)
Spoke with pt and notified of results per Dr. Wert. Pt verbalized understanding and denied any questions. 

## 2020-08-21 DIAGNOSIS — N433 Hydrocele, unspecified: Secondary | ICD-10-CM | POA: Diagnosis not present

## 2020-08-21 DIAGNOSIS — Z125 Encounter for screening for malignant neoplasm of prostate: Secondary | ICD-10-CM | POA: Diagnosis not present

## 2020-08-21 DIAGNOSIS — I10 Essential (primary) hypertension: Secondary | ICD-10-CM | POA: Diagnosis not present

## 2020-08-21 DIAGNOSIS — R7303 Prediabetes: Secondary | ICD-10-CM | POA: Diagnosis not present

## 2020-08-21 DIAGNOSIS — Z0000 Encounter for general adult medical examination without abnormal findings: Secondary | ICD-10-CM | POA: Diagnosis not present

## 2020-08-21 DIAGNOSIS — Z1211 Encounter for screening for malignant neoplasm of colon: Secondary | ICD-10-CM | POA: Diagnosis not present

## 2020-08-21 DIAGNOSIS — E7800 Pure hypercholesterolemia, unspecified: Secondary | ICD-10-CM | POA: Diagnosis not present

## 2020-08-21 DIAGNOSIS — N5203 Combined arterial insufficiency and corporo-venous occlusive erectile dysfunction: Secondary | ICD-10-CM | POA: Diagnosis not present

## 2020-10-06 ENCOUNTER — Telehealth: Payer: Self-pay | Admitting: Internal Medicine

## 2020-10-06 NOTE — Telephone Encounter (Signed)
Tylenol for aches pains, fever  mucinex dm up to 1200 mg bid for cough   F/u w/in a week if not doing better, to ER in meantime if a lot worse.

## 2020-10-06 NOTE — Telephone Encounter (Signed)
Called and spoke with pt who stated he began to have symptoms 2 days ago including chest tightness and congestion, sore throat, coughing with yellow phlegm, some wheezing.  Pt said that his major problem was the sore throat and stated that he did begin to take mucinex which has helped with his throat as well as helped with the wheezing. Pt said that he was now able to cough up a lot of phlegm and due to all this, he is feeling better than he was.  Pt said that his wife did test positive for covid 10 days ago. States that he has done two home covid tests and both of those covid tests have come back negative.  Pt said that he did have a low grade temp of 99.5 yesterday 4/21 and stated that he took tylenol, last time took tylenol was overnight 4/21. Pt's temp today while on the phone with him was 97.7.  Pt wants to know what we can recommend to help with his symptoms. Dr. Melvyn Novas, please advise.

## 2020-10-06 NOTE — Telephone Encounter (Signed)
Patient is aware of below message and voiced his understanding.  Nothing further needed at this time.   

## 2020-10-10 ENCOUNTER — Telehealth: Payer: Self-pay | Admitting: Family Medicine

## 2020-10-10 MED ORDER — NIRMATRELVIR/RITONAVIR (PAXLOVID)TABLET: ORAL_TABLET | ORAL | 0 refills | Status: DC

## 2020-10-10 NOTE — Telephone Encounter (Signed)
Ok with paxlovid

## 2020-10-10 NOTE — Telephone Encounter (Signed)
FYI-  Patient was dx with Covid on Sunday- He believes that he got this on Friday. No fever, little coughing and achy and fatigue.  CB#[phone]

## 2020-10-10 NOTE — Telephone Encounter (Signed)
Call placed to patient.   Reports cough and fatigue. States that Dr. Melvyn Novas recommended Mucinex for cough prior to positive test.   Orders for antibody sent to pharmacy.   Call placed to patient and patient made aware.

## 2020-10-13 ENCOUNTER — Telehealth: Payer: Self-pay | Admitting: Internal Medicine

## 2020-10-13 NOTE — Telephone Encounter (Addendum)
Spoke with the pt  He states dx with covid 19 on 10/08/20- called PCP 10/10/20 and they put him on paxlovid  He is feeling much improved and wanted to let Dr Melvyn Novas know he is doing better  He asked if he should be surprised that he had issues with cough so soon after his last visit with [place], and if he should expect this  I advised him that with his dx of bronchiectasis that he is more prone to cough and congestion He is aware to take mucinex or mucinex dm 1200 bid with plenty of water when has cough/congestion  He reports that is what he has been doing and it does help  He is aware to call us for possible ov/cxr if ever starts producing discolored sputum   Dr Melvyn Novas- he was not advised to f/u again until Dec 2022- do you feel that he should come in sooner since had covid?

## 2020-10-13 NOTE — Telephone Encounter (Signed)
Pt aware.

## 2020-10-13 NOTE — Telephone Encounter (Signed)
F/u sooner prn if not back to baseline in a couple of weeks

## 2020-10-26 NOTE — Progress Notes (Signed)
Subjective:   Carlos Choi is a 75 y.o. male who presents for Medicare Annual/Subsequent preventive examination.  I connected with Carlos Choi today by telephone and verified that I am speaking with the correct person using two identifiers. Location patient: home Location provider: work Persons participating in the virtual visit: patient, provider.   I discussed the limitations, risks, security and privacy concerns of performing an evaluation and management service by telephone and the availability of in person appointments. I also discussed with the patient that there may be a patient responsible charge related to this service. The patient expressed understanding and verbally consented to this telephonic visit.    Interactive audio and video telecommunications were attempted between this provider and patient, however failed, due to patient having technical difficulties OR patient did not have access to video capability.  We continued and completed visit with audio only.      Review of Systems    N/A Cardiac Risk Factors include: advanced age (>65men, >6 women);male gender     Objective:    Today's Vitals   There is no height or weight on file to calculate BMI.  Advanced Directives 10/27/2020 11/24/2018 04/28/2014 12/10/2013  Does Patient Have a Medical Advance Directive? Yes Yes No Patient has advance directive, copy not in chart  Type of Advance Directive Healthcare Power of [place];Living will Healthcare Power of [place];Living will - -  Does patient want to make changes to medical advance directive? No - Patient declined - - -  Copy of Healthcare Power of Attorney in Chart? No - copy requested - - -    Current Medications (verified) Outpatient Encounter Medications as of 10/27/2020  Medication Sig  . ALPRAZolam (XANAX) 0.25 MG tablet Take 1 tablet (0.25 mg total) by mouth daily as needed for anxiety.  [name] aspirin EC 81 MG tablet Take 81 mg by mouth daily. Swallow whole.   . Lactobacillus-Inulin (CULTURELLE DIGESTIVE HEALTH PO) Take by mouth.  . levothyroxine (SYNTHROID) 100 MCG tablet Take 1 tablet (100 mcg total) by mouth daily.  [name] loratadine (CLARITIN) 10 MG tablet Take 10 mg by mouth daily.  . Multiple Vitamins-Minerals (CENTRUM SILVER PO) Take 1 tablet by mouth daily.  . Omega-3 Fatty Acids (FISH OIL) 1000 MG CAPS Take 3 capsules by mouth daily.  . [DISCONTINUED] nirmatrelvir/ritonavir EUA (PAXLOVID) TABS Patient GFR is 74. Take nirmatrelvir (150 mg) two tablet(s) twice daily for 5 days with ritonavir (100 mg) one tablet twice daily for 5 days. (Patient not taking: Reported on 10/27/2020)   No facility-administered encounter medications on file as of 10/27/2020.    Allergies (verified) Patient has no known allergies.   History: Past Medical History:  Diagnosis Date  . Allergy   . Anxiety   . Arthritis    mild ankles   . Cataract    removed left eye, forming right eye   . Hemorrhoids   . Hypothyroid   . Rectal bleeding    due to hemorrhoid    Past Surgical History:  Procedure Laterality Date  . CATARACT EXTRACTION Left   . COLONOSCOPY    . HERNIA REPAIR     Over ten years ago per patient. He was not sure of the date.  [name] POLYPECTOMY    . ROTATOR CUFF REPAIR     both shoulders - patient does not remember the exact date   Family History  Problem Relation Age of Onset  . Aneurysm Mother   . Alzheimer's disease Father   . Cancer  Sister        ovarian  . Stroke Brother   . Hypertension Brother   . Colon cancer Neg Hx   . Esophageal cancer Neg Hx   . Stomach cancer Neg Hx   . Rectal cancer Neg Hx   . Colon polyps Neg Hx    Social History   Socioeconomic History  . Marital status: Married    Spouse name: Not on file  . Number of children: Not on file  . Years of education: Not on file  . Highest education level: Not on file  Occupational History  . Not on file  Tobacco Use  . Smoking status: Former Smoker    Packs/day: 0.50     Years: 6.00    Pack years: 3.00    Types: Cigarettes    Quit date: 06/18/1979    Years since quitting: 41.3  . Smokeless tobacco: Never Used  Substance and Sexual Activity  . Alcohol use: Yes    Comment: 2 beers Monthly.  . Drug use: No  . Sexual activity: Not on file  Other Topics Concern  . Not on file  Social History Narrative  . Not on file   Social Determinants of Health   Financial Resource Strain: Low Risk   . Difficulty of Paying Living Expenses: Not hard at all  Food Insecurity: No Food Insecurity  . Worried About Charity fundraiser in the Last Year: Never true  . Ran Out of Food in the Last Year: Never true  Transportation Needs: No Transportation Needs  . Lack of Transportation (Medical): No  . Lack of Transportation (Non-Medical): No  Physical Activity: Sufficiently Active  . Days of Exercise per Week: 7 days  . Minutes of Exercise per Session: 30 min  Stress: No Stress Concern Present  . Feeling of Stress : Not at all  Social Connections: Moderately Integrated  . Frequency of Communication with Friends and Family: More than three times a week  . Frequency of Social Gatherings with Friends and Family: Three times a week  . Attends Religious Services: More than 4 times per year  . Active Member of Clubs or Organizations: No  . Attends Archivist Meetings: Never  . Marital Status: Married    Tobacco Counseling Counseling given: Not Answered   Clinical Intake:  Pre-visit preparation completed: Yes  Pain : No/denies pain     Nutritional Risks: None Diabetes: No  How often do you need to have someone help you when you read instructions, pamphlets, or other written materials from your doctor or pharmacy?: 1 - Never  Diabetic?No  Interpreter Needed?: No  Information entered by :: [place] of Daily Living In your present state of health, do you have any difficulty performing the following activities: 10/27/2020  Hearing? N   Vision? N  Difficulty concentrating or making decisions? N  Walking or climbing stairs? N  Dressing or bathing? N  Doing errands, shopping? N  Preparing Food and eating ? N  Using the Toilet? N  In the past six months, have you accidently leaked urine? N  Do you have problems with loss of bowel control? N  Managing your Medications? N  Managing your Finances? N  Housekeeping or managing your Housekeeping? N  Some recent data might be hidden    Patient Care Team: Susy Frizzle, MD as PCP - General (Family Medicine)  Indicate any recent Medical Services you may have received from other than [facility] providers  in the past year (date may be approximate).     Assessment:   This is a routine wellness examination for Eyal.  Hearing/Vision screen  Hearing Screening   125Hz  250Hz  500Hz  1000Hz  2000Hz  3000Hz  4000Hz  6000Hz  8000Hz   Right ear:           Left ear:           Vision Screening Comments: Patient states gets eyes examined every other year. Had cataract surgery on left eye a few years ago. Patient wears over the counter reading glasses   Dietary issues and exercise activities discussed: Current Exercise Habits: Home exercise routine, Type of exercise: walking, Time (Minutes): 30, Frequency (Times/Week): 7, Weekly Exercise (Minutes/Week): 210, Intensity: Moderate, Exercise limited by: None identified  Goals Addressed            This Visit's Progress   . DIET - INCREASE WATER INTAKE      . Exercise 150 min/wk Moderate Activity       I will continue to walk 30 minutes every morning!      Depression Screen PHQ 2/9 Scores 10/27/2020 12/02/2019 11/24/2018 11/14/2017 11/05/2016 11/02/2015 07/27/2015  PHQ - 2 Score 0 0 0 0 0 0 0  PHQ- 9 Score - 2 - - - - -    Fall Risk Fall Risk  10/27/2020 12/02/2019 11/24/2018 11/14/2017 11/14/2017  Falls in the past year? 0 0 0 No No  Number falls in past yr: 0 0 - - -  Injury with Fall? 0 0 - - -  Risk for fall due to : No Fall Risks No Fall  Risks - - -  Follow up Falls evaluation completed;Falls prevention discussed - - - -    FALL RISK PREVENTION PERTAINING TO THE HOME:  Any stairs in or around the home? Yes  If so, are there any without handrails? No  Home free of loose throw rugs in walkways, pet beds, electrical cords, etc? Yes  Adequate lighting in your home to reduce risk of falls? Yes   ASSISTIVE DEVICES UTILIZED TO PREVENT FALLS:  Life alert? No  Use of a cane, walker or w/c? No  Grab bars in the bathroom? No  Shower chair or bench in shower? Yes  Elevated toilet seat or a handicapped toilet? Yes   Cognitive Function:   Normal cognitive status assessed by direct observation by this Nurse Health Advisor. No abnormalities found.        Immunizations Immunization History  Administered Date(s) Administered  . Fluad Quad(high Dose 65+) 03/16/2019  . Influenza, High Dose Seasonal PF 04/02/2017, 03/13/2020  . Influenza,inj,Quad PF,6+ Mos 04/06/2013, 04/06/2014, 04/07/2015, 03/29/2016, 03/10/2018  . Influenza-Unspecified 03/17/2012  . PFIZER(Purple Top)SARS-COV-2 Vaccination 07/07/2019, 07/28/2019, 03/31/2020  . Pneumococcal Conjugate-13 04/02/2017  . Pneumococcal Polysaccharide-23 01/23/2016  . Tdap 06/17/2009  . Zoster Recombinat (Shingrix) 04/02/2017, 06/02/2017    TDAP status: Due, Education has been provided regarding the importance of this vaccine. Advised may receive this vaccine at local pharmacy or Health Dept. Aware to provide a copy of the vaccination record if obtained from local pharmacy or Health Dept. Verbalized acceptance and understanding.  Flu Vaccine status: Up to date  Pneumococcal vaccine status: Up to date  Covid-19 vaccine status: Completed vaccines  Qualifies for Shingles Vaccine? Yes   Zostavax completed No   Shingrix Completed?: Yes  Screening Tests Health Maintenance  Topic Date Due  . Hepatitis C Screening  Never done  . TETANUS/TDAP  06/18/2019  . INFLUENZA VACCINE   01/15/2021  .  COLONOSCOPY (Pts 45-72yrs Insurance coverage will need to be confirmed)  12/15/2025  . COVID-19 Vaccine  Completed  . PNA vac Low Risk Adult  Completed  . HPV VACCINES  Aged Out    Health Maintenance  Health Maintenance Due  Topic Date Due  . Hepatitis C Screening  Never done  . TETANUS/TDAP  06/18/2019    Colorectal cancer screening: Type of screening: Colonoscopy. Completed 12/16/2018. Repeat every 7 years  Lung Cancer Screening: (Low Dose CT Chest recommended if Age 42-80 years, 30 pack-year currently smoking OR have quit w/in 15years.) does not qualify.   Lung Cancer Screening Referral: N/A  Additional Screening:  Hepatitis C Screening: does qualify;   Vision Screening: Recommended annual ophthalmology exams for early detection of glaucoma and other disorders of the eye. Is the patient up to date with their annual eye exam?  Yes  Who is the provider or what is the name of the office in which the patient attends annual eye exams? Dr. Delman Cheadle  If pt is not established with a provider, would they like to be referred to a provider to establish care? No .   Dental Screening: Recommended annual dental exams for proper oral hygiene  Community Resource Referral / Chronic Care Management: CRR required this visit?  No   CCM required this visit?  No      Plan:     I have personally reviewed and noted the following in the patient's chart:   . Medical and social history . Use of alcohol, tobacco or illicit drugs  . Current medications and supplements including opioid prescriptions. Patient is not currently taking opioid prescriptions. . Functional ability and status . Nutritional status . Physical activity . Advanced directives . List of other physicians . Hospitalizations, surgeries, and ER visits in previous 12 months . Vitals . Screenings to include cognitive, depression, and falls . Referrals and appointments  In addition, I have reviewed and discussed  with patient certain preventive protocols, quality metrics, and best practice recommendations. A written personalized care plan for preventive services as well as general preventive health recommendations were provided to patient.     Ofilia Neas, LPN   [id number]/88/[id number]   Nurse Notes: None

## 2020-10-27 ENCOUNTER — Other Ambulatory Visit: Payer: Self-pay

## 2020-10-27 ENCOUNTER — Ambulatory Visit (INDEPENDENT_AMBULATORY_CARE_PROVIDER_SITE_OTHER): Payer: Medicare Other

## 2020-10-27 DIAGNOSIS — Z0000 Encounter for general adult medical examination without abnormal findings: Secondary | ICD-10-CM | POA: Diagnosis not present

## 2020-10-27 NOTE — Patient Instructions (Signed)
Carlos Choi , Thank you for taking time to come for your Medicare Wellness Visit. I appreciate your ongoing commitment to your health goals. Please review the following plan we discussed and let me know if I can assist you in the future.   Screening recommendations/referrals: Colonoscopy: Up to date, next due 12/15/2025 Recommended yearly ophthalmology/optometry visit for glaucoma screening and checkup Recommended yearly dental visit for hygiene and checkup  Vaccinations: Influenza vaccine: Up to date, next due fall 2022 Pneumococcal vaccine: Completed series  Tdap vaccine: Currently due, you may await and injury to receive or you may receive at your local pharmacy or health department  Shingles vaccine: Completed series    Advanced directives: Copies on File  Conditions/risks identified: None   Next appointment: 12/04/2020 @ 10:30 am with Dr. Dennard Schaumann  Preventive Care 65 Years and Older, Male Preventive care refers to lifestyle choices and visits with your health care provider that can promote health and wellness. What does preventive care include?  A yearly physical exam. This is also called an annual well check.  Dental exams once or twice a year.  Routine eye exams. Ask your health care provider how often you should have your eyes checked.  Personal lifestyle choices, including:  Daily care of your teeth and gums.  Regular physical activity.  Eating a healthy diet.  Avoiding tobacco and drug use.  Limiting alcohol use.  Practicing safe sex.  Taking low doses of aspirin every day.  Taking vitamin and mineral supplements as recommended by your health care provider. What happens during an annual well check? The services and screenings done by your health care provider during your annual well check will depend on your age, overall health, lifestyle risk factors, and family history of disease. Counseling  Your health care provider may ask you questions about  your:  Alcohol use.  Tobacco use.  Drug use.  Emotional well-being.  Home and relationship well-being.  Sexual activity.  Eating habits.  History of falls.  Memory and ability to understand (cognition).  Work and work Statistician. Screening  You may have the following tests or measurements:  Height, weight, and BMI.  Blood pressure.  Lipid and cholesterol levels. These may be checked every 5 years, or more frequently if you are over 40 years old.  Skin check.  Lung cancer screening. You may have this screening every year starting at age 29 if you have a 30-pack-year history of smoking and currently smoke or have quit within the past 15 years.  Fecal occult blood test (FOBT) of the stool. You may have this test every year starting at age 26.  Flexible sigmoidoscopy or colonoscopy. You may have a sigmoidoscopy every 5 years or a colonoscopy every 10 years starting at age 31.  Prostate cancer screening. Recommendations will vary depending on your family history and other risks.  Hepatitis C blood test.  Hepatitis B blood test.  Sexually transmitted disease (STD) testing.  Diabetes screening. This is done by checking your blood sugar (glucose) after you have not eaten for a while (fasting). You may have this done every 1-3 years.  Abdominal aortic aneurysm (AAA) screening. You may need this if you are a current or former smoker.  Osteoporosis. You may be screened starting at age 75 if you are at high risk. Talk with your health care provider about your test results, treatment options, and if necessary, the need for more tests. Vaccines  Your health care provider may recommend certain vaccines, such as:  Influenza vaccine. This is recommended every year.  Tetanus, diphtheria, and acellular pertussis (Tdap, Td) vaccine. You may need a Td booster every 10 years.  Zoster vaccine. You may need this after age 24.  Pneumococcal 13-valent conjugate (PCV13) vaccine.  One dose is recommended after age 9.  Pneumococcal polysaccharide (PPSV23) vaccine. One dose is recommended after age 72. Talk to your health care provider about which screenings and vaccines you need and how often you need them. This information is not intended to replace advice given to you by your health care provider. Make sure you discuss any questions you have with your health care provider. Document Released: 06/30/2015 Document Revised: 02/21/2016 Document Reviewed: 04/04/2015 Elsevier Interactive Patient Education  2017 [place] Prevention in the Home Falls can cause injuries. They can happen to people of all ages. There are many things you can do to make your home safe and to help prevent falls. What can I do on the outside of my home?  Regularly fix the edges of walkways and driveways and fix any cracks.  Remove anything that might make you trip as you walk through a door, such as a raised step or threshold.  Trim any bushes or trees on the path to your home.  Use bright outdoor lighting.  Clear any walking paths of anything that might make someone trip, such as rocks or tools.  Regularly check to see if handrails are loose or broken. Make sure that both sides of any steps have handrails.  Any raised decks and porches should have guardrails on the edges.  Have any leaves, snow, or ice cleared regularly.  Use sand or salt on walking paths during winter.  Clean up any spills in your garage right away. This includes oil or grease spills. What can I do in the bathroom?  Use night lights.  Install grab bars by the toilet and in the tub and shower. Do not use towel bars as grab bars.  Use non-skid mats or decals in the tub or shower.  If you need to sit down in the shower, use a plastic, non-slip stool.  Keep the floor dry. Clean up any water that spills on the floor as soon as it happens.  Remove soap buildup in the tub or shower regularly.  Attach bath  mats securely with double-sided non-slip rug tape.  Do not have throw rugs and other things on the floor that can make you trip. What can I do in the bedroom?  Use night lights.  Make sure that you have a light by your bed that is easy to reach.  Do not use any sheets or blankets that are too big for your bed. They should not hang down onto the floor.  Have a firm chair that has side arms. You can use this for support while you get dressed.  Do not have throw rugs and other things on the floor that can make you trip. What can I do in the kitchen?  Clean up any spills right away.  Avoid walking on wet floors.  Keep items that you use a lot in easy-to-reach places.  If you need to reach something above you, use a strong step stool that has a grab bar.  Keep electrical cords out of the way.  Do not use floor polish or wax that makes floors slippery. If you must use wax, use non-skid floor wax.  Do not have throw rugs and other things on the floor that  can make you trip. What can I do with my stairs?  Do not leave any items on the stairs.  Make sure that there are handrails on both sides of the stairs and use them. Fix handrails that are broken or loose. Make sure that handrails are as long as the stairways.  Check any carpeting to make sure that it is firmly attached to the stairs. Fix any carpet that is loose or worn.  Avoid having throw rugs at the top or bottom of the stairs. If you do have throw rugs, attach them to the floor with carpet tape.  Make sure that you have a light switch at the top of the stairs and the bottom of the stairs. If you do not have them, ask someone to add them for you. What else can I do to help prevent falls?  Wear shoes that:  Do not have high heels.  Have rubber bottoms.  Are comfortable and fit you well.  Are closed at the toe. Do not wear sandals.  If you use a stepladder:  Make sure that it is fully opened. Do not climb a closed  stepladder.  Make sure that both sides of the stepladder are locked into place.  Ask someone to hold it for you, if possible.  Clearly mark and make sure that you can see:  Any grab bars or handrails.  First and last steps.  Where the edge of each step is.  Use tools that help you move around (mobility aids) if they are needed. These include:  Canes.  Walkers.  Scooters.  Crutches.  Turn on the lights when you go into a dark area. Replace any light bulbs as soon as they burn out.  Set up your furniture so you have a clear path. Avoid moving your furniture around.  If any of your floors are uneven, fix them.  If there are any pets around you, be aware of where they are.  Review your medicines with your doctor. Some medicines can make you feel dizzy. This can increase your chance of falling. Ask your doctor what other things that you can do to help prevent falls. This information is not intended to replace advice given to you by your health care provider. Make sure you discuss any questions you have with your health care provider. Document Released: 03/30/2009 Document Revised: 11/09/2015 Document Reviewed: 07/08/2014 Elsevier Interactive Patient Education  2017 [employer].

## 2020-11-03 ENCOUNTER — Ambulatory Visit (INDEPENDENT_AMBULATORY_CARE_PROVIDER_SITE_OTHER): Payer: Medicare Other | Admitting: Internal Medicine

## 2020-11-03 ENCOUNTER — Encounter: Payer: Self-pay | Admitting: Internal Medicine

## 2020-11-03 ENCOUNTER — Other Ambulatory Visit: Payer: Self-pay

## 2020-11-03 ENCOUNTER — Ambulatory Visit (HOSPITAL_COMMUNITY)
Admission: RE | Admit: 2020-11-03 | Discharge: 2020-11-03 | Disposition: A | Discharge status: Home / Self Care | Payer: Medicare Other | Source: Ambulatory Visit | Attending: Internal Medicine | Admitting: Internal Medicine

## 2020-11-03 DIAGNOSIS — R918 Other nonspecific abnormal finding of lung field: Secondary | ICD-10-CM

## 2020-11-03 DIAGNOSIS — J479 Bronchiectasis, uncomplicated: Secondary | ICD-10-CM

## 2020-11-03 DIAGNOSIS — R059 Cough, unspecified: Secondary | ICD-10-CM | POA: Diagnosis not present

## 2020-11-03 NOTE — Assessment & Plan Note (Signed)
Onset of symptoms July 2021 - Quant TB/ anca serologies neg 01/28/20 and convincing clinical response to zmax/augmentin rx  - CT chest 06/28/2020 No significant interval change in a small cavitary nodule of the right upper lobe, measuring approximately 0.8 x 0.8 cm. This remains nonspecific, most likely infectious or inflammatory in nature, but given morphology suggest additional follow-up at 1 year to ensure ongoing stability and exclude a small malignancy>  Placed in reminder file for 06/28/21  - Flutter valve training 11/03/2020   Reviewed again with pt and wife pathophysiology of bronchiectasis including the mucociliary escalator problem using escalator analogy. No need for abx or maint inhalers at this point  Needs to return with pfts/ alpha one and Ig Testing on return          Each maintenance medication was reviewed in detail including emphasizing most importantly the difference between maintenance and prns and under what circumstances the prns are to be triggered using an action plan format where appropriate.  Total time for H and P, chart review, counseling, reviewing flutter valve  device(s) and generating customized AVS unique to this office visit / same day charting  > 30 min

## 2020-11-03 NOTE — Progress Notes (Signed)
Carlos Choi, male    DOB: 1945-11-19       MRN: [id number]   Brief patient profile:  54 yowm minimal smoking hx with acute purulent bronchitis in Late July 2021 resolved on zmax/augmentin clinically but f/u cxr/ ct concerning for cavitary nodules so referred to pulmonary clinic in [place]  05/18/2020 by Dr  Dennard Schaumann with neg Quant TB and anca aleady done       History of Present Illness  05/18/2020  Pulmonary/ 1st office eval/ Carlos Choi / [facility] Office  Chief Complaint  Patient presents with  . Pulmonary Consult    Referred by Dr. Dennard Schaumann for eval of cavitary lesion. Pt states that he has had cough since Jan 2021. This has been much improved since round of augmentin taken Aug 2021. He does have some cough in the am with clear sputum.   Dyspnea:  Not limited, very active  Cough: a little of stick white mucus in am not every day  Sleep: 30 degrees on electric SABA use: none rec Bronchiectasis =   you have scarring of your bronchial tubes which means that they don't function perfectly normally and mucus tends to pool in certain areas of your lung which can cause pneumonia and further scarring of your lung and bronchial tubes Whenever you develop cough congestion take mucinex (wet cough) or mucinex dm (dry cough) up to 1200 mg every 12hours    Phone note  10/13/20   He states dx with covid 19 on 10/08/20- called PCP 10/10/20 and they put him on paxlovid  He is feeling much improved     11/03/2020  f/u ov/ office/Carlos Choi re: bronchiectasis  Chief Complaint  Patient presents with  . Follow-up    Productive cough with clear and sometimes yellow phlegm   Dyspnea:  Walking daily up to 1.5 miles per day s sob Cough: baselinecough in am / clear mucus - not sure took mucinex dm in max doses but got thru covid fine p paxlovid, no abx Sleeping: 30 degrees hob  SABA use: none  02: none  Covid status: x 3      No obvious day to day or daytime variability or assoc  mucus plugs or  hemoptysis or cp or chest tightness, subjective wheeze or overt sinus or hb symptoms.   sleeping without nocturnal  exacerbation  of respiratory  c/o's or need for noct saba. Also denies any obvious fluctuation of symptoms with weather or environmental changes or other aggravating or alleviating factors except as outlined above   No unusual exposure hx or h/o childhood pna/ asthma or knowledge of premature birth.  Current Allergies, Complete Past Medical History, Past Surgical History, Family History, and Social History were reviewed in [employer] record.  ROS  The following are not active complaints unless bolded Hoarseness, sore throat, dysphagia, dental problems, itching, sneezing,  nasal congestion or discharge of excess mucus or purulent secretions, ear ache,   fever, chills, sweats, unintended wt loss or wt gain, classically pleuritic or exertional cp,  orthopnea pnd or arm/hand swelling  or leg swelling, presyncope, palpitations, abdominal pain, anorexia, nausea, vomiting, diarrhea  or change in bowel habits or change in bladder habits, change in stools or change in urine, dysuria, hematuria,  rash, arthralgias, visual complaints, headache, numbness, weakness or ataxia or problems with walking or coordination,  change in mood or  memory.        Current Meds  Medication Sig  . ALPRAZolam (XANAX) 0.25 MG tablet  Take 1 tablet (0.25 mg total) by mouth daily as needed for anxiety.  [name] aspirin EC 81 MG tablet Take 81 mg by mouth daily. Swallow whole.  . Lactobacillus-Inulin ([place] PO) Take by mouth.  . levothyroxine (SYNTHROID) 100 MCG tablet Take 1 tablet (100 mcg total) by mouth daily.  [name] loratadine (CLARITIN) 10 MG tablet Take 10 mg by mouth daily.  . Multiple Vitamins-Minerals (CENTRUM SILVER PO) Take 1 tablet by mouth daily.  . Omega-3 Fatty Acids (FISH OIL) 1000 MG CAPS Take 3 capsules by mouth daily.                     Past Medical  History:  Diagnosis Date  . Allergy   . Anxiety   . Arthritis    mild ankles   . Cataract    removed left eye, forming right eye   . Hemorrhoids   . Hypothyroid   . Rectal bleeding    due to hemorrhoid       Objective:       Wt Readings from Last 3 Encounters:  11/03/20 190 lb 12.8 oz (86.5 kg)  05/18/20 194 lb (88 kg)  01/28/20 192 lb (87.1 kg)      Vital signs reviewed  11/03/2020  - Note at rest 02 sats  98% on RA   General appearance:    Ambulatory somewhat anxious wm nad   HEENT : pt wearing mask not removed for exam due to covid -19 concerns.    NECK :  without JVD/Nodes/TM/ nl carotid upstrokes bilaterally   LUNGS: no acc muscle use,  Nl contour chest which is clear to A and P bilaterally without cough on insp or exp maneuvers   CV:  RRR  no s3 or murmur or increase in P2, and no edema   ABD:  soft and nontender with nl inspiratory excursion in the supine position. No bruits or organomegaly appreciated, bowel sounds nl  MS:  Nl gait/ ext warm without deformities, calf tenderness, cyanosis or clubbing No obvious joint restrictions   SKIN: warm and dry without lesions    NEURO:  alert, approp, nl sensorium with  no motor or cerebellar deficits apparent.         CXR PA and Lateral:   11/03/2020 :    I personally reviewed images / impression as follows:    increased markings with no acute as dz      I personally reviewed images and agree with radiology impression as follows:   Chest HRCT 06/28/20 CT chest 06/28/2020 No significant interval change in a small cavitary nodule of the right upper lobe, measuring approximately 0.8 x 0.8 cm. This remains nonspecific, most likely infectious or inflammatory in nature     Assessment

## 2020-11-03 NOTE — Patient Instructions (Addendum)
For cough / congestion > mucinex dm 1200 mg every 12 hours as needed and flutter valve as much as you want if you feel it's helping   Schedule PFTs next available at [facility]    Please remember to go to the  x-ray department  @  [facility] for your tests - we will call you with the results when they are available      We will contact you in December 2022 about scheduling the CT in January (already in computer)  Add:    alpha one and quant Ig's on return

## 2020-11-05 DIAGNOSIS — J471 Bronchiectasis with (acute) exacerbation: Secondary | ICD-10-CM | POA: Diagnosis not present

## 2020-11-06 ENCOUNTER — Encounter: Payer: Self-pay | Admitting: *Deleted

## 2020-11-14 ENCOUNTER — Other Ambulatory Visit: Payer: Self-pay

## 2020-11-14 ENCOUNTER — Ambulatory Visit (HOSPITAL_COMMUNITY)
Admission: RE | Admit: 2020-11-14 | Discharge: 2020-11-14 | Disposition: A | Discharge status: Home / Self Care | Payer: Medicare Other | Source: Ambulatory Visit | Attending: Internal Medicine | Admitting: Internal Medicine

## 2020-11-14 DIAGNOSIS — R918 Other nonspecific abnormal finding of lung field: Secondary | ICD-10-CM

## 2020-11-14 DIAGNOSIS — J479 Bronchiectasis, uncomplicated: Secondary | ICD-10-CM

## 2020-11-14 LAB — PULMONARY FUNCTION TEST
DL/VA % pred: 92 %
DL/VA: 3.6 ml/min/mmHg/L
DLCO unc % pred: 88 %
DLCO unc: 26.76 ml/min/mmHg
FEF 25-75 Post: 2.03 L/sec
FEF 25-75 Pre: 2.8 L/sec
FEF2575-%Change-Post: -27 %
FEF2575-%Pred-Post: 69 %
FEF2575-%Pred-Pre: 96 %
FEV1-%Change-Post: -6 %
FEV1-%Pred-Post: 88 %
FEV1-%Pred-Pre: 94 %
FEV1-Post: 3.5 L
FEV1-Pre: 3.73 L
FEV1FVC-%Change-Post: 0 %
FEV1FVC-%Pred-Pre: 100 %
FEV6-%Change-Post: -6 %
FEV6-%Pred-Post: 90 %
FEV6-%Pred-Pre: 97 %
FEV6-Post: 4.61 L
FEV6-Pre: 4.94 L
FEV6FVC-%Change-Post: 0 %
FEV6FVC-%Pred-Post: 101 %
FEV6FVC-%Pred-Pre: 102 %
FVC-%Change-Post: -5 %
FVC-%Pred-Post: 89 %
FVC-%Pred-Pre: 94 %
FVC-Post: 4.78 L
FVC-Pre: 5.07 L
Post FEV1/FVC ratio: 73 %
Post FEV6/FVC ratio: 96 %
Pre FEV1/FVC ratio: 74 %
Pre FEV6/FVC Ratio: 97 %
RV % pred: 132 %
RV: 3.77 L
TLC % pred: 107 %
TLC: 8.87 L

## 2020-11-14 MED ORDER — ALBUTEROL SULFATE (2.5 MG/3ML) 0.083% IN NEBU
2.5000 mg | INHALATION_SOLUTION | Freq: Once | RESPIRATORY_TRACT | Status: AC
  Administered 2020-11-14: 2.5 mg via RESPIRATORY_TRACT

## 2020-11-20 ENCOUNTER — Telehealth: Payer: Self-pay | Admitting: Internal Medicine

## 2020-11-20 NOTE — Telephone Encounter (Signed)
Called and patient states he was calling to get PFT results and has questions about the flutter valve.  Went over PFT results per Dr Melvyn Novas with patient. All questions answered and patient expressed full understanding of results.    Tanda Rockers, MD  11/18/2020 4:36 PM EDT      Call patient : Study is unremarkable, no change in recs   Teaching done over the phone how to use flutter valve. All questions answered on flutter valve and patient expressed full understanding on how to use. Nothing further needed at this time.

## 2020-11-22 ENCOUNTER — Encounter: Payer: Self-pay | Admitting: *Deleted

## 2020-11-24 ENCOUNTER — Other Ambulatory Visit: Payer: Self-pay | Admitting: *Deleted

## 2020-11-24 DIAGNOSIS — E038 Other specified hypothyroidism: Secondary | ICD-10-CM

## 2020-11-24 MED ORDER — LEVOTHYROXINE SODIUM 100 MCG PO TABS: 100.0000 ug | ORAL_TABLET | Freq: Every day | ORAL | 0 refills | Status: DC

## 2020-12-04 ENCOUNTER — Ambulatory Visit (INDEPENDENT_AMBULATORY_CARE_PROVIDER_SITE_OTHER): Payer: Medicare Other | Admitting: Family Medicine

## 2020-12-04 ENCOUNTER — Other Ambulatory Visit: Payer: Self-pay

## 2020-12-04 ENCOUNTER — Encounter: Payer: Self-pay | Admitting: Family Medicine

## 2020-12-04 VITALS — BP 112/66 | HR 72 | Temp 98.1°F | Resp 14 | Ht 76.0 in | Wt 189.0 lb

## 2020-12-04 DIAGNOSIS — K589 Irritable bowel syndrome without diarrhea: Secondary | ICD-10-CM

## 2020-12-04 DIAGNOSIS — E038 Other specified hypothyroidism: Secondary | ICD-10-CM

## 2020-12-04 DIAGNOSIS — Z136 Encounter for screening for cardiovascular disorders: Secondary | ICD-10-CM | POA: Diagnosis not present

## 2020-12-04 DIAGNOSIS — Z125 Encounter for screening for malignant neoplasm of prostate: Secondary | ICD-10-CM

## 2020-12-04 DIAGNOSIS — Z0001 Encounter for general adult medical examination with abnormal findings: Secondary | ICD-10-CM | POA: Diagnosis not present

## 2020-12-04 DIAGNOSIS — R5383 Other fatigue: Secondary | ICD-10-CM | POA: Diagnosis not present

## 2020-12-04 DIAGNOSIS — E039 Hypothyroidism, unspecified: Secondary | ICD-10-CM | POA: Diagnosis not present

## 2020-12-04 DIAGNOSIS — Z13220 Encounter for screening for lipoid disorders: Secondary | ICD-10-CM

## 2020-12-04 DIAGNOSIS — Z1159 Encounter for screening for other viral diseases: Secondary | ICD-10-CM

## 2020-12-04 MED ORDER — ALPRAZOLAM 0.25 MG PO TABS: 0.2500 mg | ORAL_TABLET | Freq: Every day | ORAL | 0 refills | Status: DC | PRN

## 2020-12-04 MED ORDER — LEVOTHYROXINE SODIUM 100 MCG PO TABS: 100.0000 ug | ORAL_TABLET | Freq: Every day | ORAL | 0 refills | Status: DC

## 2020-12-04 NOTE — Progress Notes (Signed)
Subjective:    Patient ID: Carlos Choi, male    DOB: 10/05/1945, 75 y.o.   MRN: [id number]  HPI  Patient is here today for CPE.  Last colonoscopy was in 2020 and was normal.  Immunizations are up-to-date as evidenced below: Immunization History  Administered Date(s) Administered   Fluad Quad(high Dose 65+) 03/16/2019   Influenza, High Dose Seasonal PF 04/02/2017, 03/13/2020   Influenza,inj,Quad PF,6+ Mos 04/06/2013, 04/06/2014, 04/07/2015, 03/29/2016, 03/10/2018   Influenza-Unspecified 03/17/2012   PFIZER(Purple Top)SARS-COV-2 Vaccination 07/07/2019, 07/28/2019, 03/31/2020   Pneumococcal Conjugate-13 04/02/2017   Pneumococcal Polysaccharide-23 01/23/2016   Tdap 06/17/2009   Zoster Recombinat (Shingrix) 04/02/2017, 06/02/2017  His PSA is being followed by his urologist, Dr. Jeffie Pollock.  He has an appointment upcoming to see his urologist later this summer.  Therefore we will not check his PSA today.  He is due for a tetanus shot however we discussed his risk for exposure and he elects not to receive the tetanus shot due to cost.  Overall he has been doing good.  He had COVID less than 3 months ago and he has felt extremely fatigued ever since he had COVID but he denies any chest pain or pleurisy.  He is due for a booster on his COVID vaccination at his earliest convenience.  He is following with the pulmonologist.  There has been no perceptible change in the cavitary lesion in his lung.  Pulmonology feels that this is most likely scar tissue or postinfectious inflammation.  However he continues to follow-up as recommended.  Otherwise he is been doing well.  He denies any falls or depression or memory loss.  He exercises every day and walks more than 3 miles.  Immunization History  Administered Date(s) Administered   Fluad Quad(high Dose 65+) 03/16/2019   Influenza, High Dose Seasonal PF 04/02/2017, 03/13/2020   Influenza,inj,Quad PF,6+ Mos 04/06/2013, 04/06/2014, 04/07/2015, 03/29/2016,  03/10/2018   Influenza-Unspecified 03/17/2012   PFIZER(Purple Top)SARS-COV-2 Vaccination 07/07/2019, 07/28/2019, 03/31/2020   Pneumococcal Conjugate-13 04/02/2017   Pneumococcal Polysaccharide-23 01/23/2016   Tdap 06/17/2009   Zoster Recombinat (Shingrix) 04/02/2017, 06/02/2017    Past Medical History:  Diagnosis Date   Allergy    Anxiety    Arthritis    mild ankles    Cataract    removed left eye, forming right eye    Hemorrhoids    Hypothyroid    Rectal bleeding    due to hemorrhoid    Past Surgical History:  Procedure Laterality Date   CATARACT EXTRACTION Left    COLONOSCOPY     HERNIA REPAIR     Over ten years ago per patient. He was not sure of the date.   POLYPECTOMY     ROTATOR CUFF REPAIR     both shoulders - patient does not remember the exact date   Current Outpatient Medications on File Prior to Visit  Medication Sig Dispense Refill   ALPRAZolam (XANAX) 0.25 MG tablet Take 1 tablet (0.25 mg total) by mouth daily as needed for anxiety. 90 tablet 0   aspirin EC 81 MG tablet Take 81 mg by mouth daily. Swallow whole.     Lactobacillus-Inulin ([place] PO) Take by mouth.     levothyroxine (SYNTHROID) 100 MCG tablet Take 1 tablet (100 mcg total) by mouth daily. 90 tablet 0   loratadine (CLARITIN) 10 MG tablet Take 10 mg by mouth daily.     Multiple Vitamins-Minerals (CENTRUM SILVER PO) Take 1 tablet by mouth daily.  Omega-3 Fatty Acids (FISH OIL) 1000 MG CAPS Take 3 capsules by mouth daily.     No current facility-administered medications on file prior to visit.   No Known Allergies Social History   Socioeconomic History   Marital status: Married    Spouse name: Not on file   Number of children: Not on file   Years of education: Not on file   Highest education level: Not on file  Occupational History   Not on file  Tobacco Use   Smoking status: Former    Packs/day: 0.50    Years: 6.00    Pack years: 3.00    Types: Cigarettes     Quit date: 06/18/1979    Years since quitting: 41.4   Smokeless tobacco: Never  Substance and Sexual Activity   Alcohol use: Yes    Comment: 2 beers Monthly.   Drug use: No   Sexual activity: Not on file  Other Topics Concern   Not on file  Social History Narrative   Not on file   Social Determinants of Health   Financial Resource Strain: Low Risk    Difficulty of Paying Living Expenses: Not hard at all  Food Insecurity: No Food Insecurity   Worried About Running Out of Food in the Last Year: Never true   [place] in the Last Year: Never true  Transportation Needs: No Transportation Needs   Lack of Transportation (Medical): No   Lack of Transportation (Non-Medical): No  Physical Activity: Sufficiently Active   Days of Exercise per Week: 7 days   Minutes of Exercise per Session: 30 min  Stress: No Stress Concern Present   Feeling of Stress : Not at all  Social Connections: Moderately Integrated   Frequency of Communication with Friends and Family: More than three times a week   Frequency of Social Gatherings with Friends and Family: Three times a week   Attends Religious Services: More than 4 times per year   Active Member of Clubs or Organizations: No   Attends Archivist Meetings: Never   Marital Status: Married  Human resources officer Violence: Not At Risk   Fear of Current or Ex-Partner: No   Emotionally Abused: No   Physically Abused: No   Sexually Abused: No   Family History  Problem Relation Age of Onset   Aneurysm Mother    Alzheimer's disease Father    Cancer Sister        ovarian   Stroke Brother    Hypertension Brother    Colon cancer Neg Hx    Esophageal cancer Neg Hx    Stomach cancer Neg Hx    Rectal cancer Neg Hx    Colon polyps Neg Hx      Review of Systems      Objective:   Physical Exam Vitals reviewed.  Constitutional:      General: He is not in acute distress.    Appearance: Normal appearance. He is normal weight. He is not  ill-appearing, toxic-appearing or diaphoretic.  HENT:     Head: Normocephalic and atraumatic.     Right Ear: Tympanic membrane, ear canal and external ear normal. There is no impacted cerumen.     Left Ear: Tympanic membrane, ear canal and external ear normal. There is no impacted cerumen.     Nose: Nose normal. No congestion or rhinorrhea.     Mouth/Throat:     Mouth: Mucous membranes are moist.     Pharynx: Oropharynx is clear.  No oropharyngeal exudate or posterior oropharyngeal erythema.  Eyes:     General: No scleral icterus.       Right eye: No discharge.        Left eye: No discharge.     Extraocular Movements: Extraocular movements intact.     Conjunctiva/sclera: Conjunctivae normal.     Pupils: Pupils are equal, round, and reactive to light.  Neck:     Vascular: No carotid bruit.  Cardiovascular:     Rate and Rhythm: Normal rate and regular rhythm.     Pulses: Normal pulses.     Heart sounds: Normal heart sounds. No murmur heard.   No friction rub. No gallop.  Pulmonary:     Effort: Pulmonary effort is normal. No respiratory distress.     Breath sounds: Normal breath sounds. No wheezing, rhonchi or rales.  Abdominal:     General: Abdomen is flat. Bowel sounds are normal. There is no distension.     Palpations: Abdomen is soft. There is no mass.     Tenderness: There is no abdominal tenderness. There is no guarding or rebound.     Hernia: No hernia is present.  Musculoskeletal:        General: No swelling, tenderness or deformity.     Cervical back: Normal range of motion and neck supple. No rigidity or tenderness.     Right lower leg: No edema.     Left lower leg: No edema.  Lymphadenopathy:     Cervical: No cervical adenopathy.  Skin:    General: Skin is warm and dry.     Coloration: Skin is not jaundiced or pale.     Findings: No bruising, erythema, lesion or rash.  Neurological:     General: No focal deficit present.     Mental Status: He is alert and oriented  to person, place, and time. Mental status is at baseline.     Cranial Nerves: No cranial nerve deficit.     Sensory: No sensory deficit.     Motor: No weakness.     Coordination: Coordination normal.     Gait: Gait normal.     Deep Tendon Reflexes: Reflexes normal.  Psychiatric:        Mood and Affect: Mood normal.        Behavior: Behavior normal.        Thought Content: Thought content normal.        Judgment: Judgment normal.          Assessment & Plan:  Hypothyroidism, unspecified type - Plan: CBC with Differential/Platelet, COMPLETE METABOLIC PANEL WITH GFR, Lipid panel, TSH, PSA, Hepatitis C antibody, levothyroxine (SYNTHROID) 100 MCG tablet, CBC with Differential/Platelet, COMPLETE METABOLIC PANEL WITH GFR, Lipid panel, TSH  Other specified hypothyroidism - Plan: CBC with Differential/Platelet, COMPLETE METABOLIC PANEL WITH GFR, Lipid panel, TSH, PSA, Hepatitis C antibody, levothyroxine (SYNTHROID) 100 MCG tablet, CBC with Differential/Platelet, COMPLETE METABOLIC PANEL WITH GFR, Lipid panel, TSH  Fatigue, unspecified type - Plan: CBC with Differential/Platelet, COMPLETE METABOLIC PANEL WITH GFR, Lipid panel, TSH, PSA, Hepatitis C antibody  Encounter for hepatitis C screening test for low risk patient - Plan: CBC with Differential/Platelet, COMPLETE METABOLIC PANEL WITH GFR, Lipid panel, TSH, PSA, Hepatitis C antibody  Screening PSA (prostate specific antigen) - Plan: CBC with Differential/Platelet, COMPLETE METABOLIC PANEL WITH GFR, Lipid panel, TSH, PSA, Hepatitis C antibody  Encounter for lipid screening for cardiovascular disease - Plan: CBC with Differential/Platelet, COMPLETE METABOLIC PANEL WITH GFR, Lipid panel, TSH, PSA,  Hepatitis C antibody, levothyroxine (SYNTHROID) 100 MCG tablet, CBC with Differential/Platelet, COMPLETE METABOLIC PANEL WITH GFR, Lipid panel, TSH  Irritable bowel syndrome, unspecified type - Plan: ALPRAZolam (XANAX) 0.25 MG tablet Physical exam  today is excellent.  I see no abnormalities.  Blood pressure is outstanding.  Check CBC, CMP, lipid panel.  Also check TSH.  Defer PSA to his urologist.  Screen for hepatitis C.  Refilled his Xanax and levothyroxine as requested.  Regular anticipatory guidance is provided.  Recommended a booster on his COVID vaccination at his earliest convenience.  The remainder of his preventative care is up-to-date

## 2020-12-05 LAB — COMPLETE METABOLIC PANEL WITH GFR
AG Ratio: 1.7 (calc) (ref 1.0–2.5)
ALT: 9 U/L (ref 9–46)
AST: 15 U/L (ref 10–35)
Albumin: 4.7 g/dL (ref 3.6–5.1)
Alkaline phosphatase (APISO): 72 U/L (ref 35–144)
BUN: 22 mg/dL (ref 7–25)
CO2: 25 mmol/L (ref 20–32)
Calcium: 9.5 mg/dL (ref 8.6–10.3)
Chloride: 105 mmol/L (ref 98–110)
Creat: 0.91 mg/dL (ref 0.70–1.18)
GFR, Est African American: 97 mL/min/{1.73_m2} (ref >=60)
GFR, Est Non African American: 83 mL/min/{1.73_m2} (ref >=60)
Globulin: 2.8 g/dL (calc) (ref 1.9–3.7)
Glucose, Bld: 91 mg/dL (ref 65–99)
Potassium: 5.3 mmol/L (ref 3.5–5.3)
Sodium: 139 mmol/L (ref 135–146)
Total Bilirubin: 1.1 mg/dL (ref 0.2–1.2)
Total Protein: 7.5 g/dL (ref 6.1–8.1)

## 2020-12-05 LAB — CBC WITH DIFFERENTIAL/PLATELET
Absolute Monocytes: 578 cells/uL (ref 200–950)
Basophils Absolute: 70 cells/uL (ref 0–200)
Basophils Relative: 1.3 %
Eosinophils Absolute: 92 cells/uL (ref 15–500)
Eosinophils Relative: 1.7 %
HCT: 50.3 % — ABNORMAL HIGH (ref 38.5–50.0)
Hemoglobin: 17.1 g/dL (ref 13.2–17.1)
Lymphs Abs: 1458 cells/uL (ref 850–3900)
MCH: 31 pg (ref 27.0–33.0)
MCHC: 34 g/dL (ref 32.0–36.0)
MCV: 91.1 fL (ref 80.0–100.0)
MPV: 12 fL (ref 7.5–12.5)
Monocytes Relative: 10.7 %
Neutro Abs: 3202 cells/uL (ref 1500–7800)
Neutrophils Relative %: 59.3 %
Platelets: 180 10*3/uL (ref 140–400)
RBC: 5.52 10*6/uL (ref 4.20–5.80)
RDW: 12.8 % (ref 11.0–15.0)
Total Lymphocyte: 27 %
WBC: 5.4 10*3/uL (ref 3.8–10.8)

## 2020-12-05 LAB — HEPATITIS C ANTIBODY
Hepatitis C Ab: NONREACTIVE
SIGNAL TO CUT-OFF: 0.01 (ref <1.00)

## 2020-12-05 LAB — LIPID PANEL
Cholesterol: 200 mg/dL — ABNORMAL HIGH (ref <200)
HDL: 49 mg/dL (ref >=40)
LDL Cholesterol (Calc): 129 mg/dL (calc) — ABNORMAL HIGH
Non-HDL Cholesterol (Calc): 151 mg/dL (calc) — ABNORMAL HIGH (ref <130)
Total CHOL/HDL Ratio: 4.1 (calc) (ref <5.0)
Triglycerides: 116 mg/dL (ref <150)

## 2020-12-05 LAB — TSH: TSH: 1.04 mIU/L (ref 0.40–4.50)

## 2020-12-05 LAB — PSA: PSA: 2.1 ng/mL (ref <=4.00)

## 2020-12-27 ENCOUNTER — Encounter: Payer: Self-pay | Admitting: Family Medicine

## 2020-12-30 DIAGNOSIS — U071 COVID-19: Secondary | ICD-10-CM | POA: Diagnosis not present

## 2021-01-03 ENCOUNTER — Other Ambulatory Visit: Payer: Self-pay

## 2021-01-03 ENCOUNTER — Ambulatory Visit (INDEPENDENT_AMBULATORY_CARE_PROVIDER_SITE_OTHER): Payer: Medicare Other

## 2021-01-03 ENCOUNTER — Ambulatory Visit: Payer: Medicare Other | Admitting: Podiatry

## 2021-01-03 ENCOUNTER — Encounter: Payer: Self-pay | Admitting: Podiatry

## 2021-01-03 DIAGNOSIS — M21612 Bunion of left foot: Secondary | ICD-10-CM | POA: Diagnosis not present

## 2021-01-03 DIAGNOSIS — M21619 Bunion of unspecified foot: Secondary | ICD-10-CM

## 2021-01-03 DIAGNOSIS — M21611 Bunion of right foot: Secondary | ICD-10-CM

## 2021-01-03 DIAGNOSIS — M778 Other enthesopathies, not elsewhere classified: Secondary | ICD-10-CM

## 2021-01-03 MED ORDER — TRIAMCINOLONE ACETONIDE 10 MG/ML IJ SUSP
10.0000 mg | Freq: Once | INTRAMUSCULAR | Status: AC
  Administered 2021-01-03: 10 mg

## 2021-01-03 NOTE — Patient Instructions (Signed)

## 2021-01-04 NOTE — Progress Notes (Signed)
Subjective:   Patient ID: Carlos Choi, male   DOB: 75 y.o.   MRN: [id number]   HPI Patient states he tries to be very active and states has had a bunion for years and it started to bother him with certain types of shoes.  Patient states he feels like there is fluid around it and does not smoke likes to be active   Review of Systems  All other systems reviewed and are negative.      Objective:  Physical Exam Vitals and nursing note reviewed.  Constitutional:      Appearance: He is well-developed.  Pulmonary:     Effort: Pulmonary effort is normal.  Musculoskeletal:        General: Normal range of motion.  Skin:    General: Skin is warm.  Neurological:     Mental Status: He is alert.    Neurovascular status intact muscle strength was found to be adequate range of motion adequate.  Patient is found to have inflammation and fluid with structural deformity of the first metatarsal head right with mild redness and prominence around the area.  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

## 2021-02-14 DIAGNOSIS — D485 Neoplasm of uncertain behavior of skin: Secondary | ICD-10-CM | POA: Diagnosis not present

## 2021-02-14 DIAGNOSIS — D0462 Carcinoma in situ of skin of left upper limb, including shoulder: Secondary | ICD-10-CM | POA: Diagnosis not present

## 2021-02-14 DIAGNOSIS — L570 Actinic keratosis: Secondary | ICD-10-CM | POA: Diagnosis not present

## 2021-02-14 DIAGNOSIS — N401 Enlarged prostate with lower urinary tract symptoms: Secondary | ICD-10-CM | POA: Diagnosis not present

## 2021-02-14 DIAGNOSIS — R972 Elevated prostate specific antigen [PSA]: Secondary | ICD-10-CM | POA: Diagnosis not present

## 2021-02-14 DIAGNOSIS — N5201 Erectile dysfunction due to arterial insufficiency: Secondary | ICD-10-CM | POA: Diagnosis not present

## 2021-02-14 DIAGNOSIS — R351 Nocturia: Secondary | ICD-10-CM | POA: Diagnosis not present

## 2021-02-14 DIAGNOSIS — L309 Dermatitis, unspecified: Secondary | ICD-10-CM | POA: Diagnosis not present

## 2021-03-05 DIAGNOSIS — N5203 Combined arterial insufficiency and corporo-venous occlusive erectile dysfunction: Secondary | ICD-10-CM | POA: Diagnosis not present

## 2021-03-05 DIAGNOSIS — R7303 Prediabetes: Secondary | ICD-10-CM | POA: Diagnosis not present

## 2021-03-05 DIAGNOSIS — E7800 Pure hypercholesterolemia, unspecified: Secondary | ICD-10-CM | POA: Diagnosis not present

## 2021-03-05 DIAGNOSIS — I10 Essential (primary) hypertension: Secondary | ICD-10-CM | POA: Diagnosis not present

## 2021-03-14 ENCOUNTER — Telehealth: Payer: Self-pay | Admitting: Family Medicine

## 2021-03-14 NOTE — Progress Notes (Signed)
  Chronic Care Management   Outreach Note  03/14/2021 Name: Carlos Choi MRN: [id number] DOB: [birth year]  Referred by: Susy Frizzle, MD Reason for referral : No chief complaint on file.   An unsuccessful telephone outreach was attempted today. The patient was referred to the pharmacist for assistance with care management and care coordination.   Follow Up Plan:   Tatjana Dellinger Upstream Scheduler

## 2021-03-15 ENCOUNTER — Telehealth: Payer: Self-pay | Admitting: Family Medicine

## 2021-03-15 NOTE — Progress Notes (Signed)
  Chronic Care Management   Note  03/15/2021 Name: Carlos Choi MRN: [id number] DOB: [birth year]  Carlos Choi is a 75 y.o. year old male who is a primary care patient of Carlos Choi, Carlos Mcgee, MD. I reached out to Carlos Choi by phone today in response to a referral sent by Mr. Carlos Choi's PCP, Carlos Frizzle, MD.   Carlos Choi was given information about Chronic Care Management services today including:  CCM service includes personalized support from designated clinical staff supervised by his physician, including individualized plan of care and coordination with other care providers 24/7 contact phone numbers for assistance for urgent and routine care needs. Service will only be billed when office clinical staff spend 20 minutes or more in a month to coordinate care. Only one practitioner may furnish and bill the service in a calendar month. The patient may stop CCM services at any time (effective at the end of the month) by phone call to the office staff.   Patient agreed to services and verbal consent obtained.   Follow up plan:   Carlos Choi

## 2021-03-29 DIAGNOSIS — Z23 Encounter for immunization: Secondary | ICD-10-CM | POA: Diagnosis not present

## 2021-03-29 DIAGNOSIS — U071 COVID-19: Secondary | ICD-10-CM | POA: Diagnosis not present

## 2021-04-03 DIAGNOSIS — Z23 Encounter for immunization: Secondary | ICD-10-CM | POA: Diagnosis not present

## 2021-06-05 NOTE — Progress Notes (Signed)
Chronic Care Management Pharmacy Note  06/14/2021 Name:  GIAN YBARRA MRN:  [id number] DOB:  September 13, 1945  Summary: Initial visit with PharmD.  Takes levothyroxine as directed.  Requesting refill for 90 days supply x 2.  Discussed elevated LDL - hesitant to start statin.  After discussion he was willing to start taking Lipitor 33m twice weekly.  This gives him some time to see effectiveness before next physical.  Recommendations/Changes made from today's visit: Start Lipitor 164mevery Tuesday and Thursday Call in refill for levothyroxine  Plan: CMA Fu 30 days to assess tolerance   Subjective: WiDEONDRA WIGGERs an [age].o. year old male who is a primary patient of Pickard, WaCammie McgeeMD.  The CCM team was consulted for assistance with disease management and care coordination needs.    Engaged with patient by telephone for follow up visit in response to provider referral for pharmacy case management and/or care coordination services.   Consent to Services:  The patient was given the following information about Chronic Care Management services today, agreed to services, and gave verbal consent: 1. CCM service includes personalized support from designated clinical staff supervised by the primary care provider, including individualized plan of care and coordination with other care providers 2. 24/7 contact phone numbers for assistance for urgent and routine care needs. 3. Service will only be billed when office clinical staff spend 20 minutes or more in a month to coordinate care. 4. Only one practitioner may furnish and bill the service in a calendar month. 5.The patient may stop CCM services at any time (effective at the end of the month) by phone call to the office staff. 6. The patient will be responsible for cost sharing (co-pay) of up to 20% of the service fee (after annual deductible is met). Patient agreed to services and consent obtained.  Patient Care Team: PiSusy FrizzleMD as  PCP - General (Family Medicine) DaEdythe ClarityRPHans [facility] Pharmacist (Pharmacist)  Recent office visits:  None noted   Recent consult visits:  01/03/21 NoIla McgillDPTiftf right and left foot ordered. triamcinolone acetonide (KENALOG) 10 MG/ML injection 10 mg administered. Follow up as scheduled.    Hospital visits:  None in previous 6 months   Objective:  Lab Results  Component Value Date   CREATININE 0.91 12/04/2020   BUN 22 12/04/2020   GFRNONAA 83 12/04/2020   GFRAA 97 12/04/2020   NA 139 12/04/2020   K 5.3 12/04/2020   CALCIUM 9.5 12/04/2020   CO2 25 12/04/2020   GLUCOSE 91 12/04/2020    No results found for: HGBA1C, FRUCTOSAMINE, GFR, MICROALBUR  Last diabetic Eye exam: No results found for: HMDIABEYEEXA  Last diabetic Foot exam: No results found for: HMDIABFOOTEX   Lab Results  Component Value Date   CHOL 200 (H) 12/04/2020   HDL 49 12/04/2020   LDLCALC 129 (H) 12/04/2020   TRIG 116 12/04/2020   CHOLHDL 4.1 12/04/2020    Hepatic Function Latest Ref Rng & Units 12/04/2020 11/25/2019 11/10/2018  Total Protein 6.1 - 8.1 g/dL 7.5 6.5 7.4  Albumin 3.6 - 5.1 g/dL - - -  AST 10 - 35 U/L 15 14 16   ALT 9 - 46 U/L 9 9 10   Alk Phosphatase 40 - 115 U/L - - -  Total Bilirubin 0.2 - 1.2 mg/dL 1.1 0.7 1.1    Lab Results  Component Value Date/Time   TSH 1.04 12/04/2020 10:49 AM  TSH 1.00 11/25/2019 09:57 AM   FREET4 1.66 10/06/2013 12:36 PM    CBC Latest Ref Rng & Units 12/04/2020 11/25/2019 11/10/2018  WBC 3.8 - 10.8 Thousand/uL 5.4 8.7 6.5  Hemoglobin 13.2 - 17.1 g/dL 17.1 15.7 17.1  Hematocrit 38.5 - 50.0 % 50.3(H) 45.3 51.7(H)  Platelets 140 - 400 Thousand/uL 180 183 192    No results found for: VD25OH  Clinical ASCVD: No  The 10-year ASCVD risk score (Arnett DK, et al., 2019) is: 17.9%   Values used to calculate the score:     Age: 27 years     Sex: Male     Is Non-Hispanic African American: No     Diabetic: No     Tobacco  smoker: No     Systolic Blood Pressure: 160 mmHg     Is BP treated: No     HDL Cholesterol: 49 mg/dL     Total Cholesterol: 200 mg/dL    Depression screen [facility] 2/9 12/04/2020 10/27/2020 12/02/2019  Decreased Interest 0 0 0  Down, Depressed, Hopeless 0 0 0  PHQ - 2 Score 0 0 0  Altered sleeping 1 - 1  Tired, decreased energy 0 - 1  Change in appetite 0 - 0  Feeling bad or failure about yourself  0 - 0  Trouble concentrating 0 - 0  Moving slowly or fidgety/restless 0 - 0  Suicidal thoughts 0 - 0  PHQ-9 Score 1 - 2  Difficult doing work/chores Not difficult at all - Not difficult at all      Social History   Tobacco Use  Smoking Status Former   Packs/day: 0.50   Years: 6.00   Pack years: 3.00   Types: Cigarettes   Quit date: 06/18/1979   Years since quitting: 42.0  Smokeless Tobacco Never   BP Readings from Last 3 Encounters:  12/04/20 112/66  11/03/20 132/78  05/18/20 122/70   Pulse Readings from Last 3 Encounters:  12/04/20 72  11/03/20 74  05/18/20 80   Wt Readings from Last 3 Encounters:  12/04/20 189 lb (85.7 kg)  11/03/20 190 lb 12.8 oz (86.5 kg)  05/18/20 194 lb (88 kg)   BMI Readings from Last 3 Encounters:  12/04/20 23.01 kg/m  11/03/20 23.22 kg/m  05/18/20 23.61 kg/m    Assessment/Interventions: Review of patient past medical history, allergies, medications, health status, including review of consultants reports, laboratory and other test data, was performed as part of comprehensive evaluation and provision of chronic care management services.   SDOH:  (Social Determinants of Health) assessments and interventions performed: Yes  Financial Resource Strain: Low Risk    Difficulty of Paying Living Expenses: Not hard at all    SDOH Screenings   Alcohol Screen: Low Risk    Last Alcohol Screening Score (AUDIT): 1  Depression (PHQ2-9): Low Risk    PHQ-2 Score: 1  Financial Resource Strain: Low Risk    Difficulty of Paying Living Expenses: Not hard at  all  Food Insecurity: No Food Insecurity   Worried About Charity fundraiser in the Last Year: Never true   Ran Out of Food in the Last Year: Never true  Housing: Low Risk    Last Housing Risk Score: 0  Physical Activity: Sufficiently Active   Days of Exercise per Week: 7 days   Minutes of Exercise per Session: 30 min  Social Connections: Moderately Integrated   Frequency of Communication with Friends and Family: More than three times a week  Frequency of Social Gatherings with Friends and Family: Three times a week   Attends Religious Services: More than 4 times per year   Active Member of Clubs or Organizations: No   Attends Archivist Meetings: Never   Marital Status: Married  Stress: No Stress Concern Present   Feeling of Stress : Not at all  Tobacco Use: Medium Risk   Smoking Tobacco Use: Former   Smokeless Tobacco Use: Never   Passive Exposure: Not on file  Transportation Needs: No Transportation Needs   Lack of Transportation (Medical): No   Lack of Transportation (Non-Medical): No    CCM Care Plan  No Known Allergies  Medications Reviewed Today     Reviewed by Edythe Clarity, [facility] (Pharmacist) on 06/14/21 at 33  Med List Status: <None>   Medication Order Taking? Sig Documenting Provider Last Dose Status Informant  ALPRAZolam (XANAX) 0.25 MG tablet [id number] Yes Take 1 tablet (0.25 mg total) by mouth daily as needed for anxiety. Susy Frizzle, MD Taking Active   aspirin EC 81 MG tablet [id number] Yes Take 81 mg by mouth every other day. Swallow whole. [provider] Taking Active            Med Note Al Corpus, Langley Adie   Fri Oct 27, 2020  9:53 AM) Taking 1 tablet by mouth every other day  Lactobacillus-Inulin ([place]) [id number] Yes Take by mouth. [provider] Taking Active   levothyroxine (SYNTHROID) 100 MCG tablet [id number] Yes Take 1 tablet (100 mcg total) by mouth daily. Susy Frizzle, MD Taking  Active   loratadine (CLARITIN) 10 MG tablet [id number] Yes Take 10 mg by mouth daily. [provider] Taking Active Self  Multiple Vitamins-Minerals (CENTRUM SILVER PO) [id number] Yes Take 1 tablet by mouth daily. [provider] Taking Active Self  Omega-3 Fatty Acids (FISH OIL) 1000 MG CAPS [id number] Yes Take 3 capsules by mouth daily. [provider] Taking Active Self            Patient Active Problem List   Diagnosis Date Noted   Bronchiectasis without complication ([place]) 63/06/6008   Pulmonary infiltrates on CXR 05/18/2020   Adenomatous colon polyp 04/14/2014   Nasal septal ulcer 10/26/2013   Hypothyroid 10/06/2013   Elevated PSA 03/26/2012   Hemorrhoids, internal, with bleeding 06/13/2011    Immunization History  Administered Date(s) Administered   Fluad Quad(high Dose 65+) 03/16/2019   Influenza, High Dose Seasonal PF 04/02/2017, 03/13/2020, 04/09/2021   Influenza,inj,Quad PF,6+ Mos 04/06/2013, 04/06/2014, 04/07/2015, 03/29/2016, 03/10/2018   Influenza-Unspecified 03/17/2012   PFIZER(Purple Top)SARS-COV-2 Vaccination 07/07/2019, 07/28/2019, 03/31/2020, 12/11/2020   Pfizer Covid-19 Vaccine Bivalent Booster 47yr & up 04/09/2021   Pneumococcal Conjugate-13 04/02/2017   Pneumococcal Polysaccharide-23 01/23/2016   Tdap 06/17/2009   Zoster Recombinat (Shingrix) 04/02/2017, 06/02/2017    Conditions to be addressed/monitored:  Hypothyroidism, HLD, Bronchiectasis  Care Plan : General Pharmacy (Adult)  Updates made by DEdythe Clarity RPH since 06/14/2021 12:00 AM     Problem: Hypothyroidism, HLD, Bronchiectasis   Priority: High  Onset Date: 06/14/2021     Long-Range Goal: Patient-Specific Goal   Start Date: 06/14/2021  Expected End Date: 12/13/2021  This Visit's Progress: On track  Priority: High  Note:   Current Barriers:  Unable to achieve control of cholesterol   Pharmacist Clinical Goal(s):  Patient will achieve improvement in LDL  as evidenced by follow up labs through collaboration with PharmD and provider.   Interventions: 1:1 collaboration with PDennard Schaumann  Cammie Mcgee, MD regarding development and update of comprehensive plan of care as evidenced by provider attestation and co-signature Inter-disciplinary care team collaboration (see longitudinal plan of care) Comprehensive medication review performed; medication list updated in electronic medical record  Hypothyroidism (Goal: Maintain TSH) -Controlled -Current treatment  Levothyroxine 11mg daily -Medications previously tried: none noted  -Takes medication appropriately, TSH is WNL. -Recommended to continue current medication No changes needed at this time.  Hyperlipidemia: (LDL goal < 100) -Uncontrolled -Current treatment: None -Medications previously tried: none noted  -Current exercise habits: walks about 1.5 miles daily with wife -Educated on Cholesterol goals;  Benefits of statin for ASCVD risk reduction; Importance of limiting foods high in cholesterol; Patient has previously declined statin with elevated LDL.  Most recent LDL 129.  ASCVD risk 17.9% - moderate risk.  Would be candidate for moderate intensity statin. -Counseled on potentially starting statin a few days per week due to his initial hesitancy. Recommended Lipitor 171mon Tuesdays and Thursdays.  Patient agreeable to plan, will give him 6 months to evaluate efficacy.   CMA to call in 2-3 weeks to assess tolerability.  Bronchiectasis (Goal: Minimize symptoms) -Uncontrolled -Current treatment  Mucinex OTC prn -Medications previously tried: none noted -Followed by pulmonology, he is due for follow up visit with them for CT scan.  -Recommended to continue current medication He has bothersome phlegm in the mornings that he has to cough up, treating with OTC Mucinex which provides some benefit. Recommended he continue regular follow up scans on this with pulmonology - he plans to schedule at  the first of the year.   Patient Goals/Self-Care Activities Patient will:  - take medications as prescribed as evidenced by patient report and record review focus on medication adherence by pill count Begin statin medication twice weekly, goal LDL < 100.  Follow Up Plan: The care management team will reach out to the patient again over the next 30 days.        Medication Assistance: None required.  Patient affirms current coverage meets needs.  Compliance/Adherence/Medication fill history: Care Gaps: Tdap  Star-Rating Drugs: N/A  Patient's preferred pharmacy is:  [id number] [place] [place]one: [phone]: [phone] (MAIEnigmaLEBourbonnaisM [place]one: [phone]: [phone] Surgery Center LLCAISunsetALPlayitaZ [place]one: [phone]: [address] [place] [address]one: [phone]: [phone] pill box? No - only takes one med.  Uses one when he goes on trips Pt endorses 100% compliance  We discussed: Benefits of medication synchronization, packaging and delivery as well as enhanced pharmacist oversight with [employer]. Patient decided to: Continue current medication management strategy  Care Plan and Follow Up Patient Decision:  Patient agrees to Care Plan and Follow-up.  Plan: The care management team will reach out to the patient again over the next 30 days.  ChrBeverly MilchharmD, [name] Clinical Pharmacist Practitioner [phone]

## 2021-06-12 ENCOUNTER — Telehealth: Payer: Self-pay | Admitting: Pharmacist

## 2021-06-12 NOTE — Chronic Care Management (AMB) (Signed)
Chronic Care Management Pharmacy Assistant   Name: NOLBERTO CHEUVRONT  MRN: [id number] DOB: [birth year]  Carlos Choi is an 75 y.o. year old male who presents for his initial CCM visit with the clinical pharmacist.  Reason for Encounter: Chart Prep for Initial visit with [name] on 06/14/21 @ 10:00 am (Televisit)   Recent office visits:  None noted  Recent consult visits:  01/03/21 Ila Mcgill, [place] of right and left foot ordered. triamcinolone acetonide (KENALOG) 10 MG/ML injection 10 mg administered. Follow up as scheduled.   Hospital visits:  None in previous 6 months  Medications: Outpatient Encounter Medications as of 06/12/2021  Medication Sig Note   ALPRAZolam (XANAX) 0.25 MG tablet Take 1 tablet (0.25 mg total) by mouth daily as needed for anxiety.    aspirin EC 81 MG tablet Take 81 mg by mouth every other day. Swallow whole. 10/27/2020: Taking 1 tablet by mouth every other day   Lactobacillus-Inulin ([place]) Take by mouth.    levothyroxine (SYNTHROID) 100 MCG tablet Take 1 tablet (100 mcg total) by mouth daily.    loratadine (CLARITIN) 10 MG tablet Take 10 mg by mouth daily.    Multiple Vitamins-Minerals (CENTRUM SILVER PO) Take 1 tablet by mouth daily.    Omega-3 Fatty Acids (FISH OIL) 1000 MG CAPS Take 3 capsules by mouth daily.    No facility-administered encounter medications on file as of 06/12/2021.     Have you seen any other providers since your last visit? Patient has seen Dr Melvyn Novas Pulmonology.   Any changes in your medications or health? Patient reported he is being followed by Dr Melvyn Novas for a nodule but reports he is doing well currently.   Any side effects from any medications? Patient denied any side effects from his current medication.   Do you have an symptoms or problems not managed by your medications? No  Any concerns about your health right now? Patient denied any concerns with his health at this time.    Has your provider asked that you check blood pressure, blood sugar, or follow special diet at home? Patient has taken in the past but has been doing well with his blood pressure and no longer taking any prescription medication for it.   Do you get any type of exercise on a regular basis? He reports he and his wife walk about a mile and a half a day.   Can you think of a goal you would like to reach for your health? He would like to maintain his current health at this time.   Do you have any problems getting your medications? Patient denied any problems getting his medication. He reports he is only taking one prescription medication.   Is there anything that you would like to discuss during the appointment? Patient would just like to get more information about what to expect moving forward and looks forward to it.   Please bring medications and supplements to appointment. Patient confirmed appointment date and time.    Care Gaps  AWV: done 10/27/2020 Colonoscopy: done 12/16/18 DM Eye Exam: N/A DM Foot Exam: N/A Microalbumin: N/A HbgAIC:  N/A DEXA: N/A Mammogram: N/A   Star Rating Drugs: No Star Rating Drugs noted.    Future Appointments  Date Time Provider [place]  06/14/2021 10:00 AM BSF[facility]-CCM PHARMACIST [facility] None  12/06/2021 10:30 AM Pickard, Cammie Mcgee, MD [facility] None   Jobe Gibbon, [facility] Clinical Pharmacist Assistant  (  [phone] ° ° °

## 2021-06-14 ENCOUNTER — Ambulatory Visit (INDEPENDENT_AMBULATORY_CARE_PROVIDER_SITE_OTHER): Payer: Medicare Other | Admitting: Pharmacist

## 2021-06-14 ENCOUNTER — Encounter: Payer: Self-pay | Admitting: Family Medicine

## 2021-06-14 DIAGNOSIS — Z13220 Encounter for screening for lipoid disorders: Secondary | ICD-10-CM

## 2021-06-14 DIAGNOSIS — Z136 Encounter for screening for cardiovascular disorders: Secondary | ICD-10-CM

## 2021-06-14 DIAGNOSIS — E039 Hypothyroidism, unspecified: Secondary | ICD-10-CM

## 2021-06-14 DIAGNOSIS — E038 Other specified hypothyroidism: Secondary | ICD-10-CM

## 2021-06-14 DIAGNOSIS — J479 Bronchiectasis, uncomplicated: Secondary | ICD-10-CM

## 2021-06-14 MED ORDER — LEVOTHYROXINE SODIUM 100 MCG PO TABS
100.0000 ug | ORAL_TABLET | Freq: Every day | ORAL | 1 refills | Status: DC
Start: 2021-06-14 — End: 2021-12-06

## 2021-06-14 NOTE — Patient Instructions (Addendum)
Visit Information   Goals Addressed             This Visit's Progress    LDL < 100       Timeframe:  Long-Range Goal Priority:  High Start Date:  06/14/21                           Expected End Date: 12/13/21                      Follow Up Date 09/12/21   Begin statin - goal LDL < 100   Why is this important?   These steps will help you keep on track with your medicines.   Notes:        Patient Care Plan: General Pharmacy (Adult)     Problem Identified: Hypothyroidism, HLD, Bronchiectasis   Priority: High  Onset Date: 06/14/2021     Long-Range Goal: Patient-Specific Goal   Start Date: 06/14/2021  Expected End Date: 12/13/2021  This Visit's Progress: On track  Priority: High  Note:   Current Barriers:  Unable to achieve control of cholesterol   Pharmacist Clinical Goal(s):  Patient will achieve improvement in LDL as evidenced by follow up labs through collaboration with PharmD and provider.   Interventions: 1:1 collaboration with Susy Frizzle, MD regarding development and update of comprehensive plan of care as evidenced by provider attestation and co-signature Inter-disciplinary care team collaboration (see longitudinal plan of care) Comprehensive medication review performed; medication list updated in electronic medical record  Hypothyroidism (Goal: Maintain TSH) -Controlled -Current treatment  Levothyroxine 14mcg daily -Medications previously tried: none noted  -Takes medication appropriately, TSH is WNL. -Recommended to continue current medication No changes needed at this time.  Hyperlipidemia: (LDL goal < 100) -Uncontrolled -Current treatment: None -Medications previously tried: none noted  -Current exercise habits: walks about 1.5 miles daily with wife -Educated on Cholesterol goals;  Benefits of statin for ASCVD risk reduction; Importance of limiting foods high in cholesterol; Patient has previously declined statin with elevated LDL.   Most recent LDL 129.  ASCVD risk 17.9% - moderate risk.  Would be candidate for moderate intensity statin. -Counseled on potentially starting statin a few days per week due to his initial hesitancy. Recommended Lipitor 10mg  on Tuesdays and Thursdays.  Patient agreeable to plan, will give him 6 months to evaluate efficacy.   CMA to call in 2-3 weeks to assess tolerability.  Bronchiectasis (Goal: Minimize symptoms) -Uncontrolled -Current treatment  Mucinex OTC prn -Medications previously tried: none noted -Followed by pulmonology, he is due for follow up visit with them for CT scan.  -Recommended to continue current medication He has bothersome phlegm in the mornings that he has to cough up, treating with OTC Mucinex which provides some benefit. Recommended he continue regular follow up scans on this with pulmonology - he plans to schedule at the first of the year.   Patient Goals/Self-Care Activities Patient will:  - take medications as prescribed as evidenced by patient report and record review focus on medication adherence by pill count Begin statin medication twice weekly, goal LDL < 100.  Follow Up Plan: The care management team will reach out to the patient again over the next 30 days.       Mr. Cowans was given information about Chronic Care Management services today including:  CCM service includes personalized support from designated clinical staff supervised by his physician, including individualized plan of care  and coordination with other care providers 24/7 contact phone numbers for assistance for urgent and routine care needs. Standard insurance, coinsurance, copays and deductibles apply for chronic care management only during months in which we provide at least 20 minutes of these services. Most insurances cover these services at 100%, however patients may be responsible for any copay, coinsurance and/or deductible if applicable. This service may help you avoid the need for  more expensive face-to-face services. Only one practitioner may furnish and bill the service in a calendar month. The patient may stop CCM services at any time (effective at the end of the month) by phone call to the office staff.  Patient agreed to services and verbal consent obtained.   The patient verbalized understanding of instructions, educational materials, and care plan provided today and agreed to receive a mailed copy of patient instructions, educational materials, and care plan.  Telephone follow up appointment with pharmacy team member scheduled for: 6 months  Edythe Clarity, [place], PharmD, [place] Clinical Pharmacist Practitioner [place] [phone]

## 2021-06-15 ENCOUNTER — Telehealth: Payer: Self-pay | Admitting: Pharmacist

## 2021-06-15 DIAGNOSIS — E785 Hyperlipidemia, unspecified: Secondary | ICD-10-CM

## 2021-06-15 MED ORDER — ATORVASTATIN CALCIUM 10 MG PO TABS
10.0000 mg | ORAL_TABLET | ORAL | 1 refills | Status: DC
Start: 2021-06-18 — End: 2021-09-10

## 2021-06-15 NOTE — Chronic Care Management (AMB) (Signed)
See below, PCP agreed to start Atorvastatin 10mg  twice weekly.  Will call into pharmacy of choice.  Patient contacted to inform him of new rx.  Beverly Milch, PharmD, [name] Clinical Pharmacist Practitioner [place] [phone]

## 2021-06-15 NOTE — Telephone Encounter (Signed)
-----   Message from Susy Frizzle, MD sent at 06/15/2021  6:36 AM EST ----- I am fine with this.   ----- Message ----- From: Edythe Clarity, [facility] Sent: 06/14/2021  11:14 AM EST To: Susy Frizzle, MD  Karen Chafe with Mr. Simonis today noted his elevated LDL from June. Previous recommendation for Lipitor he has declined. After discussion today he was willing to try Atorvastatin 10mg  twice weekly and recheck his LDL at upcoming physical in June.  Just wanted your thoughts on this before moving forward.  Thanks, Beverly Milch, PharmD, [name] Clinical Pharmacist Practitioner [place] [phone]

## 2021-06-16 DIAGNOSIS — E039 Hypothyroidism, unspecified: Secondary | ICD-10-CM | POA: Diagnosis not present

## 2021-06-16 DIAGNOSIS — J479 Bronchiectasis, uncomplicated: Secondary | ICD-10-CM

## 2021-06-16 DIAGNOSIS — E038 Other specified hypothyroidism: Secondary | ICD-10-CM | POA: Diagnosis not present

## 2021-06-16 DIAGNOSIS — E785 Hyperlipidemia, unspecified: Secondary | ICD-10-CM | POA: Diagnosis not present

## 2021-06-20 ENCOUNTER — Telehealth: Payer: Self-pay | Admitting: Internal Medicine

## 2021-06-20 DIAGNOSIS — J479 Bronchiectasis, uncomplicated: Secondary | ICD-10-CM

## 2021-06-20 NOTE — Telephone Encounter (Signed)
There are no orders for a ct to be scheduled

## 2021-06-21 NOTE — Telephone Encounter (Signed)
Dx is bronchiectasis and yes needs to be HRCT as the lung nodule is likely related to the bronchiectasis and not a separate problem

## 2021-06-21 NOTE — Telephone Encounter (Signed)
Order has been placed for CT scan.  

## 2021-06-21 NOTE — Telephone Encounter (Signed)
Dr. Melvyn Novas, please advise if the one year follow up for lung nodule needs to be another HRCT or non contrasted CT. Thanks.

## 2021-06-26 NOTE — Telephone Encounter (Signed)
Ct has been scheduled

## 2021-07-05 ENCOUNTER — Telehealth: Payer: Self-pay | Admitting: Pharmacist

## 2021-07-05 NOTE — Progress Notes (Addendum)
° ° °  Chronic Care Management Pharmacy Assistant   Name: Carlos Choi  MRN: [id number] DOB: 1945-10-24   Reason for Encounter: Disease State - General Adherence Call      Recent office visits:  None noted.   Recent consult visits:  None noted.   Hospital visits:  None in previous 6 months  Medications: Outpatient Encounter Medications as of 07/05/2021  Medication Sig Note   ALPRAZolam (XANAX) 0.25 MG tablet Take 1 tablet (0.25 mg total) by mouth daily as needed for anxiety.    aspirin EC 81 MG tablet Take 81 mg by mouth every other day. Swallow whole. 10/27/2020: Taking 1 tablet by mouth every other day   atorvastatin (LIPITOR) 10 MG tablet Take 1 tablet (10 mg total) by mouth 2 (two) times a week.    Lactobacillus-Inulin ([place] PO) Take by mouth.    levothyroxine (SYNTHROID) 100 MCG tablet Take 1 tablet (100 mcg total) by mouth daily.    loratadine (CLARITIN) 10 MG tablet Take 10 mg by mouth daily.    Multiple Vitamins-Minerals (CENTRUM SILVER PO) Take 1 tablet by mouth daily.    Omega-3 Fatty Acids (FISH OIL) 1000 MG CAPS Take 3 capsules by mouth daily.    No facility-administered encounter medications on file as of 07/05/2021.    Have you had any problems recently with your health? Patient denied any problems or concerns with his health recently.   Have you had any problems with your pharmacy? Patient denied any problems with his current pharmacy.   What issues or side effects are you having with your medications? Patient denied any side effects or concerns with his current medications.   What would you like me to pass along to [name], [name] for them to help you with?  Patient stated he just began to take his new statin and not having any side effects so far but just started it about 3 days ago.   What can we do to take care of you better? Patient did not have anything to add at this time. He is satisfied with his level of care.  Care Gaps    AWV: done 10/27/2020 Colonoscopy: done 12/16/18 DM Eye Exam: N/A DM Foot Exam: N/A Microalbumin: N/A HbgAIC:  N/A DEXA: N/A Mammogram: N/A   Star Rating Drugs: No Star Rating Drugs noted.    Future Appointments  Date Time Provider [place]  08/01/2021  4:00 PM [facility]-CT 1 [facility] [place] [facility]  12/06/2021 10:30 AM Susy Frizzle, MD [facility] None  12/20/2021  3:00 PM [facility] PHARMACIST [facility] None    Jobe Gibbon, CCMA Clinical Pharmacist Assistant  [phone]  6 minutes spent in review, coordination, and documentation.  Statin filled, patient has FU with PCP in June and will recheck lipids at this time for efficacy.  Glad he is having no side effects yet.  Reviewed by: Beverly Milch, PharmD Clinical Pharmacist [phone]

## 2021-08-01 ENCOUNTER — Other Ambulatory Visit: Payer: Self-pay

## 2021-08-01 ENCOUNTER — Ambulatory Visit (HOSPITAL_COMMUNITY)
Admission: RE | Admit: 2021-08-01 | Discharge: 2021-08-01 | Disposition: A | Discharge status: Home / Self Care | Payer: Medicare Other | Source: Ambulatory Visit | Attending: Internal Medicine | Admitting: Internal Medicine

## 2021-08-01 DIAGNOSIS — I700 Atherosclerosis of aorta: Secondary | ICD-10-CM | POA: Diagnosis not present

## 2021-08-01 DIAGNOSIS — J479 Bronchiectasis, uncomplicated: Secondary | ICD-10-CM | POA: Diagnosis not present

## 2021-08-01 DIAGNOSIS — I517 Cardiomegaly: Secondary | ICD-10-CM | POA: Diagnosis not present

## 2021-08-01 DIAGNOSIS — I2510 Atherosclerotic heart disease of native coronary artery without angina pectoris: Secondary | ICD-10-CM | POA: Diagnosis not present

## 2021-08-06 ENCOUNTER — Telehealth: Payer: Self-pay | Admitting: Internal Medicine

## 2021-08-06 NOTE — Telephone Encounter (Signed)
Call addressed in result note. Message sent to [facility] regarding questions patient has about CT findings

## 2021-09-03 DIAGNOSIS — I10 Essential (primary) hypertension: Secondary | ICD-10-CM | POA: Diagnosis not present

## 2021-09-03 DIAGNOSIS — N529 Male erectile dysfunction, unspecified: Secondary | ICD-10-CM | POA: Diagnosis not present

## 2021-09-03 DIAGNOSIS — Z125 Encounter for screening for malignant neoplasm of prostate: Secondary | ICD-10-CM | POA: Diagnosis not present

## 2021-09-03 DIAGNOSIS — Z0000 Encounter for general adult medical examination without abnormal findings: Secondary | ICD-10-CM | POA: Diagnosis not present

## 2021-09-03 DIAGNOSIS — N433 Hydrocele, unspecified: Secondary | ICD-10-CM | POA: Diagnosis not present

## 2021-09-03 DIAGNOSIS — R7303 Prediabetes: Secondary | ICD-10-CM | POA: Diagnosis not present

## 2021-09-03 DIAGNOSIS — E7800 Pure hypercholesterolemia, unspecified: Secondary | ICD-10-CM | POA: Diagnosis not present

## 2021-09-04 ENCOUNTER — Telehealth: Payer: Self-pay | Admitting: Pharmacist

## 2021-09-04 NOTE — Chronic Care Management (AMB) (Signed)
? ? ?  Chronic Care Management ?Pharmacy Assistant  ? ?Name: Carlos Choi  MRN: [id number] DOB: April 08, 1946 ? ? ?Reason for Encounter: Disease State - General Adherence Call  ?  ? ? ?Recent office visits:  ?None noted.  ? ?Recent consult visits:  ?None noted.  ? ?Hospital visits:  ?None in previous 6 months ? ? ?Medications: ?Outpatient Encounter Medications as of 09/04/2021  ?Medication Sig Note  ? ALPRAZolam (XANAX) 0.25 MG tablet Take 1 tablet (0.25 mg total) by mouth daily as needed for anxiety.   ? aspirin EC 81 MG tablet Take 81 mg by mouth every other day. Swallow whole. 10/27/2020: Taking 1 tablet by mouth every other day  ? atorvastatin (LIPITOR) 10 MG tablet Take 1 tablet (10 mg total) by mouth 2 (two) times a week.   ? Lactobacillus-Inulin ([place] PO) Take by mouth.   ? levothyroxine (SYNTHROID) 100 MCG tablet Take 1 tablet (100 mcg total) by mouth daily.   ? loratadine (CLARITIN) 10 MG tablet Take 10 mg by mouth daily.   ? Multiple Vitamins-Minerals (CENTRUM SILVER PO) Take 1 tablet by mouth daily.   ? Omega-3 Fatty Acids (FISH OIL) 1000 MG CAPS Take 3 capsules by mouth daily.   ? ?No facility-administered encounter medications on file as of 09/04/2021.  ? ? ?Have you had any problems recently with your health? ?Patient denied any problems with his health recently. ? ?Have you had any problems with your pharmacy? ?Patient denied any problems with his pharmacy. ? ?What issues or side effects are you having with your medications? ?Patient denied any issues or side effects with his current medications.  ? ?What would you like me to pass along to [name], [name] for them to help you with?  ?Patient did not have anything to pass along to [name] at this time. He reported he is doing well with the statin so far. He has been having some mild ankle swelling that he plans to call PCP to get an appt to discuss further this week.  ? ?What can we do to take care of you better? ?Patient did not have  anything to recommend he appreciated the call and all that [name] has done to help him.  ? ?Care Gaps ?  ?AWV: done 10/27/2020 ?Colonoscopy: done 12/16/18 ?DM Eye Exam: N/A ?DM Foot Exam: N/A ?Microalbumin: N/A ?HbgAIC:  N/A ?DEXA: N/A ?Mammogram: N/A ? ? ?Star Rating Drugs: ?No Star Rating Drugs noted.  ? ? ?Future Appointments  ?Date Time Provider [place]  ?12/06/2021 10:30 AM Susy Frizzle, MD [facility]  ?12/20/2021  3:00 PM [facility] PHARMACIST [facility]  ? ? ? ?Liza Showfety, CCMA ?Clinical Pharmacist Assistant  ?[phone] ? ? ?

## 2021-09-10 ENCOUNTER — Other Ambulatory Visit: Payer: Self-pay

## 2021-09-10 ENCOUNTER — Ambulatory Visit (INDEPENDENT_AMBULATORY_CARE_PROVIDER_SITE_OTHER): Payer: Medicare Other | Admitting: Family Medicine

## 2021-09-10 VITALS — BP 128/62 | HR 94 | Temp 97.3°F | Ht 76.0 in | Wt 191.0 lb

## 2021-09-10 DIAGNOSIS — G629 Polyneuropathy, unspecified: Secondary | ICD-10-CM

## 2021-09-10 DIAGNOSIS — E785 Hyperlipidemia, unspecified: Secondary | ICD-10-CM

## 2021-09-10 MED ORDER — ATORVASTATIN CALCIUM 10 MG PO TABS: 10.0000 mg | ORAL_TABLET | Freq: Every day | ORAL | 1 refills | Status: DC

## 2021-09-10 NOTE — Progress Notes (Signed)
? ?Subjective:  ? ? Patient ID: Carlos Choi, male    DOB: 1946/03/14, 76 y.o.   MRN: [id number] ? ?HPI ?Had CT 2/23: ?IMPRESSION: ?1. 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 ?2. Stable small thick-walled cavitary lesion in the right upper lobe ?measuring 7 x 8 mm, likely part of this chronic atypical infectious ?process, however, continued attention on follow-up studies is ?recommended. ?3. Air trapping indicative of small airways disease. ?4. Aortic atherosclerosis, in addition to left main and left ?anterior descending coronary artery disease. Assessment for ?potential risk factor modification, dietary therapy or pharmacologic ?therapy may be warranted, if clinically indicated. ? ?Patient is asymptomatic.  He denies any chest pain shortness of breath or dyspnea on exertion.  However he is only taking his statin twice a week.  He denies any myalgias from taking statin. ? ?His biggest concern today is neuropathy.  He states over the last few months he has been developing burning and stinging pain in both feet.  It will sometimes radiate up to his mid shin level.  It tingles and burns at other times it is numb.  Sometimes he has a dull ache in his heels.  He walks every day for exercise.  He denies any pain in the webspace between his toes.  He has excellent pulses checked at the dorsalis pedis and posterior tibialis pulses bilaterally.  He has normal sensation to 10 g monofilament.  Normal sensation to vibration and normal reflexes with normal muscle strength.  He denies any numbness radiating down his leg.  He denies any pain radiating down his leg to suggest sciatica ? ? ?Past Medical History:  ?Diagnosis Date  ? Allergy   ? Anxiety   ? Arthritis   ? mild ankles   ? Cataract   ? removed left eye, forming right eye   ? Hemorrhoids   ? Hypothyroid   ? Rectal bleeding   ? due to  hemorrhoid   ? ?Past Surgical History:  ?Procedure Laterality Date  ? CATARACT EXTRACTION Left   ? COLONOSCOPY    ? HERNIA REPAIR    ? Over ten years ago per patient. He was not sure of the date.  ? POLYPECTOMY    ? ROTATOR CUFF REPAIR    ? both shoulders - patient does not remember the exact date  ? ?Current Outpatient Medications on File Prior to Visit  ?Medication Sig Dispense Refill  ? ALPRAZolam (XANAX) 0.25 MG tablet Take 1 tablet (0.25 mg total) by mouth daily as needed for anxiety. 90 tablet 0  ? aspirin EC 81 MG tablet Take 81 mg by mouth every other day. Swallow whole.    ? atorvastatin (LIPITOR) 10 MG tablet Take 1 tablet (10 mg total) by mouth 2 (two) times a week. 24 tablet 1  ? Lactobacillus-Inulin ([place] PO) Take by mouth.    ? levothyroxine (SYNTHROID) 100 MCG tablet Take 1 tablet (100 mcg total) by mouth daily. 90 tablet 1  ? loratadine (CLARITIN) 10 MG tablet Take 10 mg by mouth daily.    ? Multiple Vitamins-Minerals (CENTRUM SILVER PO) Take 1 tablet by mouth daily.    ? Omega-3 Fatty Acids (FISH OIL) 1000 MG CAPS Take 3 capsules by mouth daily.    ? ?No current facility-administered medications on file prior to visit.  ? ?No Known Allergies ?Social History  ? ?Socioeconomic History  ?  Marital status: Married  ?  Spouse name: Not on file  ? Number of children: Not on file  ? Years of education: Not on file  ? Highest education level: Not on file  ?Occupational History  ? Not on file  ?Tobacco Use  ? Smoking status: Former  ?  Packs/day: 0.50  ?  Years: 6.00  ?  Pack years: 3.00  ?  Types: Cigarettes  ?  Quit date: 06/18/1979  ?  Years since quitting: 42.2  ? Smokeless tobacco: Never  ?Substance and Sexual Activity  ? Alcohol use: Yes  ?  Comment: 2 beers Monthly.  ? Drug use: No  ? Sexual activity: Not on file  ?Other Topics Concern  ? Not on file  ?Social History Narrative  ? Not on file  ? ?Social Determinants of Health  ? ?Financial Resource Strain: Low Risk   ? Difficulty of  Paying Living Expenses: Not hard at all  ?Food Insecurity: No Food Insecurity  ? Worried About Charity fundraiser in the Last Year: Never true  ? Ran Out of Food in the Last Year: Never true  ?Transportation Needs: No Transportation Needs  ? Lack of Transportation (Medical): No  ? Lack of Transportation (Non-Medical): No  ?Physical Activity: Sufficiently Active  ? Days of Exercise per Week: 7 days  ? Minutes of Exercise per Session: 30 min  ?Stress: No Stress Concern Present  ? Feeling of Stress : Not at all  ?Social Connections: Moderately Integrated  ? Frequency of Communication with Friends and Family: More than three times a week  ? Frequency of Social Gatherings with Friends and Family: Three times a week  ? Attends Religious Services: More than 4 times per year  ? Active Member of Clubs or Organizations: No  ? Attends Archivist Meetings: Never  ? Marital Status: Married  ?Intimate Partner Violence: Not At Risk  ? Fear of Current or Ex-Partner: No  ? Emotionally Abused: No  ? Physically Abused: No  ? Sexually Abused: No  ? ?Family History  ?Problem Relation Age of Onset  ? Aneurysm Mother   ? Alzheimer's disease Father   ? Cancer Sister   ?     ovarian  ? Stroke Brother   ? Hypertension Brother   ? Colon cancer Neg Hx   ? Esophageal cancer Neg Hx   ? Stomach cancer Neg Hx   ? Rectal cancer Neg Hx   ? Colon polyps Neg Hx   ? ? ? ?Review of Systems ? ?   ?Objective:  ? Physical Exam ?Vitals reviewed.  ?Constitutional:   ?   General: He is not in acute distress. ?   Appearance: Normal appearance. He is normal weight. He is not ill-appearing, toxic-appearing or diaphoretic.  ?HENT:  ?   Head: Normocephalic and atraumatic.  ?   Right Ear: Tympanic membrane, ear canal and external ear normal. There is no impacted cerumen.  ?   Left Ear: Tympanic membrane, ear canal and external ear normal. There is no impacted cerumen.  ?   Nose: Nose normal. No congestion or rhinorrhea.  ?   Mouth/Throat:  ?   Mouth:  Mucous membranes are moist.  ?   Pharynx: Oropharynx is clear. No oropharyngeal exudate or posterior oropharyngeal erythema.  ?Eyes:  ?   General: No scleral icterus.    ?   Right eye: No discharge.     ?   Left eye: No discharge.  ?  Extraocular Movements: Extraocular movements intact.  ?   Conjunctiva/sclera: Conjunctivae normal.  ?   Pupils: Pupils are equal, round, and reactive to light.  ?Neck:  ?   Vascular: No carotid bruit.  ?Cardiovascular:  ?   Rate and Rhythm: Normal rate and regular rhythm.  ?   Pulses: Normal pulses.  ?   Heart sounds: Normal heart sounds. No murmur heard. ?  No friction rub. No gallop.  ?Pulmonary:  ?   Effort: Pulmonary effort is normal. No respiratory distress.  ?   Breath sounds: Normal breath sounds. No wheezing, rhonchi or rales.  ?Abdominal:  ?   General: Abdomen is flat. Bowel sounds are normal. There is no distension.  ?   Palpations: Abdomen is soft. There is no mass.  ?   Tenderness: There is no abdominal tenderness. There is no guarding or rebound.  ?   Hernia: No hernia is present.  ?Musculoskeletal:     ?   General: No swelling, tenderness or deformity.  ?   Cervical back: Normal range of motion and neck supple. No rigidity or tenderness.  ?   Right lower leg: No edema.  ?   Left lower leg: No edema.  ?Lymphadenopathy:  ?   Cervical: No cervical adenopathy.  ?Skin: ?   General: Skin is warm and dry.  ?   Coloration: Skin is not jaundiced or pale.  ?   Findings: No bruising, erythema, lesion or rash.  ?Neurological:  ?   General: No focal deficit present.  ?   Mental Status: He is alert and oriented to person, place, and time. Mental status is at baseline.  ?   Cranial Nerves: No cranial nerve deficit.  ?   Sensory: No sensory deficit.  ?   Motor: No weakness.  ?   Coordination: Coordination normal.  ?   Gait: Gait normal.  ?   Deep Tendon Reflexes: Reflexes normal.  ?Psychiatric:     ?   Mood and Affect: Mood normal.     ?   Behavior: Behavior normal.     ?   Thought  Content: Thought content normal.     ?   Judgment: Judgment normal.  ? ? ? ? ? ?   ?Assessment & Plan:  ?Neuropathy - Plan: COMPLETE METABOLIC PANEL WITH GFR, Vitamin B12, TSH, Hemoglobin A1c ? ?Hyperlipidemia w

## 2021-09-11 DIAGNOSIS — U071 COVID-19: Secondary | ICD-10-CM | POA: Diagnosis not present

## 2021-09-11 LAB — COMPLETE METABOLIC PANEL WITH GFR
AG Ratio: 1.5 (calc) (ref 1.0–2.5)
ALT: 14 U/L (ref 9–46)
AST: 18 U/L (ref 10–35)
Albumin: 4.3 g/dL (ref 3.6–5.1)
Alkaline phosphatase (APISO): 65 U/L (ref 35–144)
BUN: 18 mg/dL (ref 7–25)
CO2: 23 mmol/L (ref 20–32)
Calcium: 9 mg/dL (ref 8.6–10.3)
Chloride: 104 mmol/L (ref 98–110)
Creat: 1 mg/dL (ref 0.70–1.28)
Globulin: 2.8 g/dL (calc) (ref 1.9–3.7)
Glucose, Bld: 84 mg/dL (ref 65–99)
Potassium: 4.3 mmol/L (ref 3.5–5.3)
Sodium: 139 mmol/L (ref 135–146)
Total Bilirubin: 1.4 mg/dL — ABNORMAL HIGH (ref 0.2–1.2)
Total Protein: 7.1 g/dL (ref 6.1–8.1)
eGFR: 79 mL/min/{1.73_m2} (ref >=60)

## 2021-09-11 LAB — VITAMIN B12: Vitamin B-12: 351 pg/mL (ref 200–1100)

## 2021-09-11 LAB — HEMOGLOBIN A1C
Hgb A1c MFr Bld: 5.8 % of total Hgb — ABNORMAL HIGH (ref <5.7)
Mean Plasma Glucose: 120 mg/dL
eAG (mmol/L): 6.6 mmol/L

## 2021-09-11 LAB — TSH: TSH: 1.92 mIU/L (ref 0.40–4.50)

## 2021-09-21 ENCOUNTER — Telehealth: Payer: Self-pay | Admitting: Pharmacist

## 2021-09-21 NOTE — Chronic Care Management (AMB) (Signed)
? ? ?  Chronic Care Management ?Pharmacy Assistant  ? ?Name: Carlos Choi  MRN: [id number] DOB: 07-23-1945 ? ? ?Reason for Encounter: Disease State - General Adherence Call  ?  ? ?Recent office visits:  ?None noted.  ? ?Recent consult visits:  ?None noted. ? ?Hospital visits:  ?None in previous 6 months ? ? ?Medications: ?Outpatient Encounter Medications as of 09/21/2021  ?Medication Sig Note  ? ALPRAZolam (XANAX) 0.25 MG tablet Take 1 tablet (0.25 mg total) by mouth daily as needed for anxiety.   ? aspirin EC 81 MG tablet Take 81 mg by mouth every other day. Swallow whole. 10/27/2020: Taking 1 tablet by mouth every other day  ? atorvastatin (LIPITOR) 10 MG tablet Take 1 tablet (10 mg total) by mouth daily.   ? Lactobacillus-Inulin ([place] PO) Take by mouth.   ? levothyroxine (SYNTHROID) 100 MCG tablet Take 1 tablet (100 mcg total) by mouth daily.   ? loratadine (CLARITIN) 10 MG tablet Take 10 mg by mouth daily.   ? Multiple Vitamins-Minerals (CENTRUM SILVER PO) Take 1 tablet by mouth daily.   ? Omega-3 Fatty Acids (FISH OIL) 1000 MG CAPS Take 3 capsules by mouth daily.   ? ?No facility-administered encounter medications on file as of 09/21/2021.  ? ? ?Have you had any problems recently with your health? ?Patient denied any problems with his health recently. ?  ?Have you had any problems with your pharmacy? ?Patient denied any problems with his pharmacy. ?  ?What issues or side effects are you having with your medications? ?Patient denied any issues or side effects with his current medications. He reported that he was tolerating the statin well.  ?  ?What would you like me to pass along to [name], [name] for them to help you with?  ?Patient did not have anything to pass along to [name] at this time. He saw [name] last week about his leg swelling. He was prescribed something for Neuropathy  (Gabapentin) but has not started it as he got invited to go to [place] [employer] this [place] and wanted to hold off  on taking a new medication until he was at home.  ?  ?What can we do to take care of you better? ?Patient did not have anything to recommend he appreciated the call and all that [name] has done to help him. He did feel his appointments seem to be rushed lately and he does not feel like he always gets to have time to address things when he comes in the office. He really appreciated having CCM to fall on when he has questions post visits or needs clarification.  ? ? ? ?Care Gaps ?  ?AWV: done 10/27/2020 ?Colonoscopy: done 12/16/18 ?DM Eye Exam: N/A ?DM Foot Exam: N/A ?Microalbumin: N/A ?HbgAIC:  N/A ?DEXA: N/A ?Mammogram: N/A ?  ?  ?Star Rating Drugs: ?No Star Rating Drugs noted.  ? ? ?Future Appointments  ?Date Time Provider [place]  ?12/06/2021 10:30 AM Susy Frizzle, MD [facility]  ?12/20/2021  3:00 PM [facility] PHARMACIST [facility]  ? ? ? ?Liza Showfety, CCMA ?Clinical Pharmacist Assistant  ?[phone] ? ? ?

## 2021-10-23 IMAGING — CT CT CHEST HIGH RESOLUTION W/O CM
3 of 5 series · 14 of 36 positions shown, 15 images · non-contrast
Comparison: CT chest, 04/26/2020

CLINICAL DATA: History of cough, no current cough, follow-up prior
CT

EXAM:
CT CHEST WITHOUT CONTRAST
TECHNIQUE: Multidetector CT imaging of the chest was performed following the
standard protocol without intravenous contrast. High resolution
imaging of the lungs, as well as inspiratory and expiratory imaging,
was performed.

[Series 3: standard chest · axial · 0.71mm/px · z∈[-240,+76]mm · 8 of 196 slices shown]
[im 19/196  mediastinal]
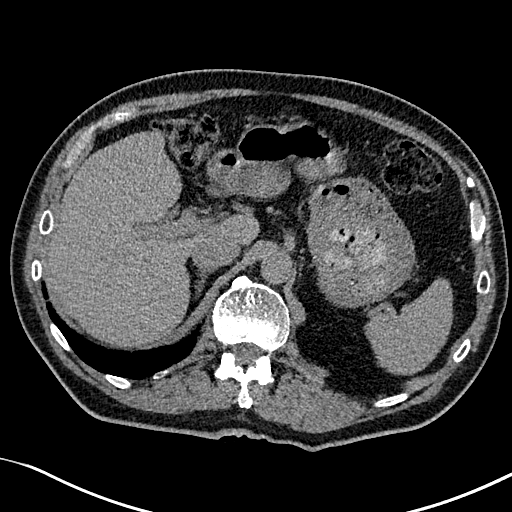
[im 38/196  mediastinal]
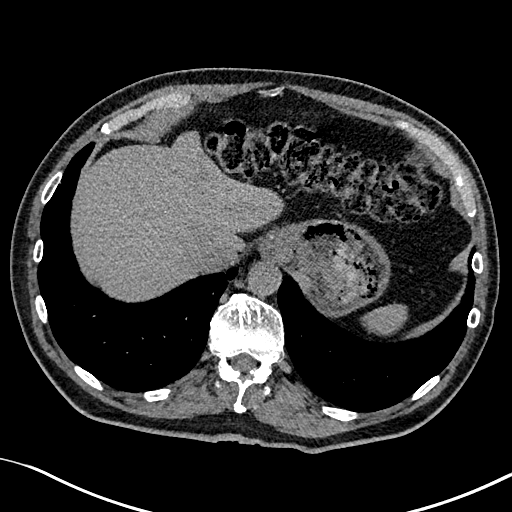
[im 66/196  mediastinal]
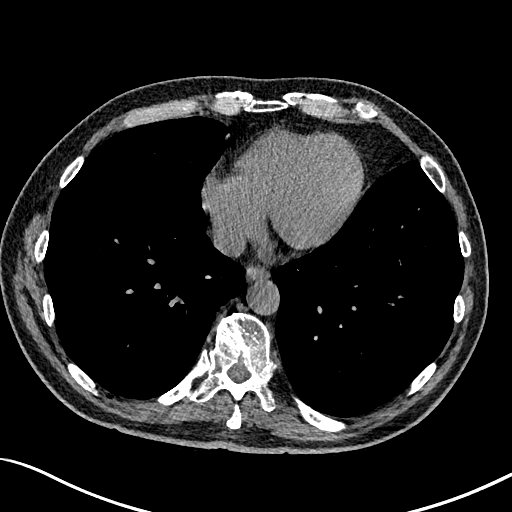
[im 84/196  mediastinal]
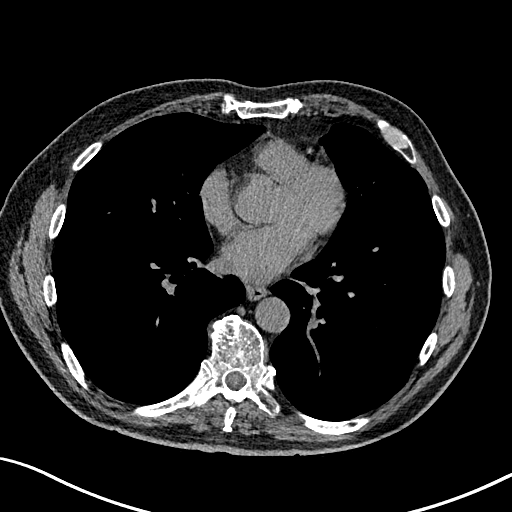
[im 112/196  mediastinal]
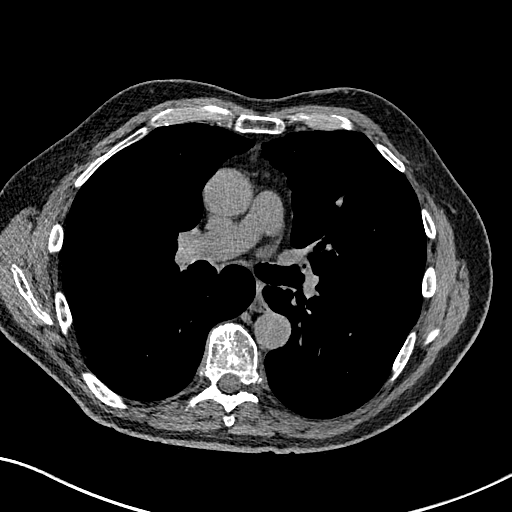
[im 131/196  mediastinal]
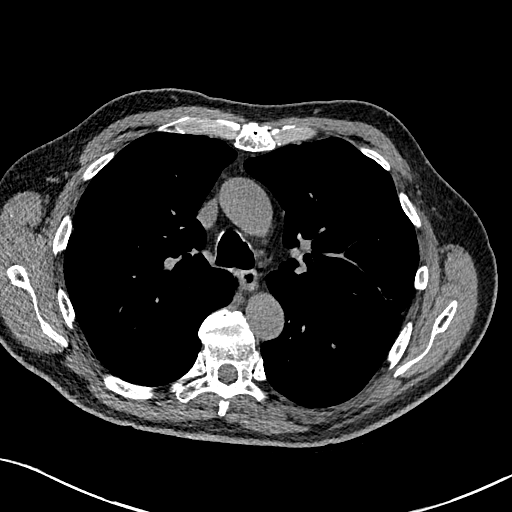
[im 158/196  mediastinal]
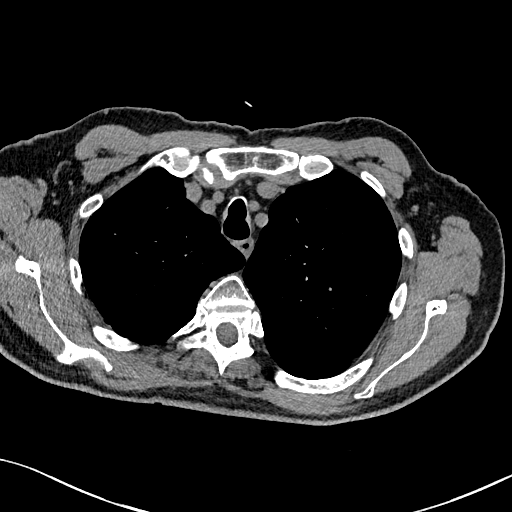
[im 177/196  mediastinal]
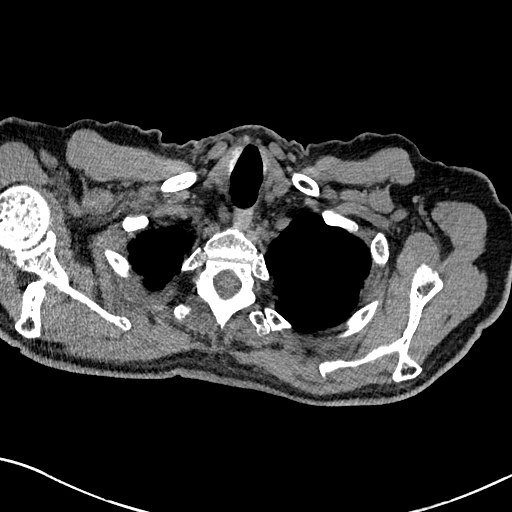

[Series 4: high res insp · axial · 0.71mm/px · z∈[-275,+116]mm · 3 of 28 slices shown, 4 images]
[im 1/28  mediastinal]
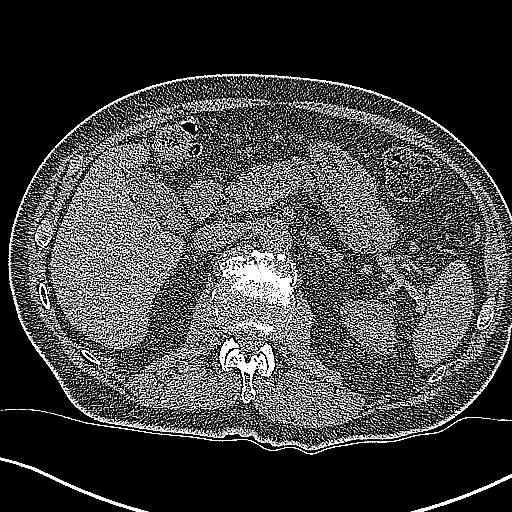
[im 1/28  lung]
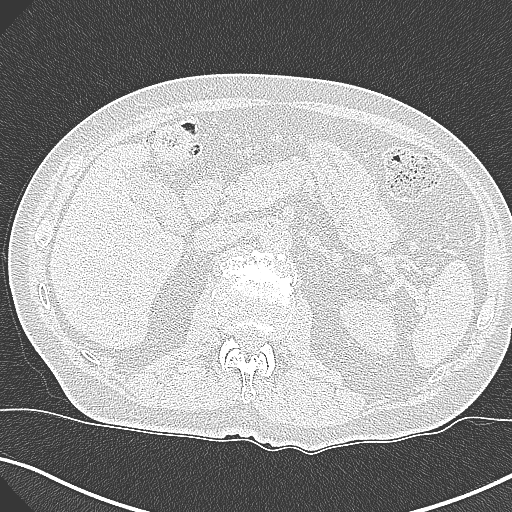
[im 14/28  lung]
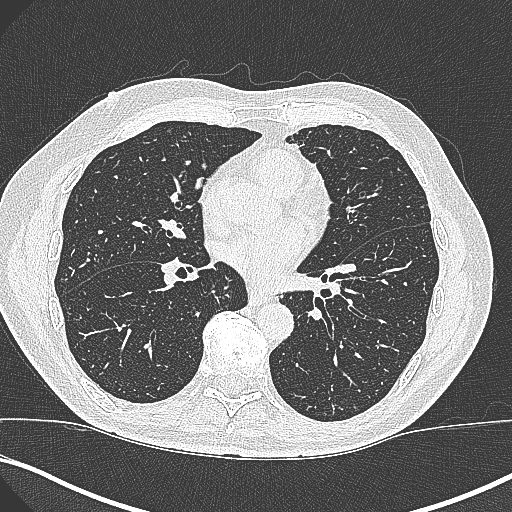
[im 28/28  lung]
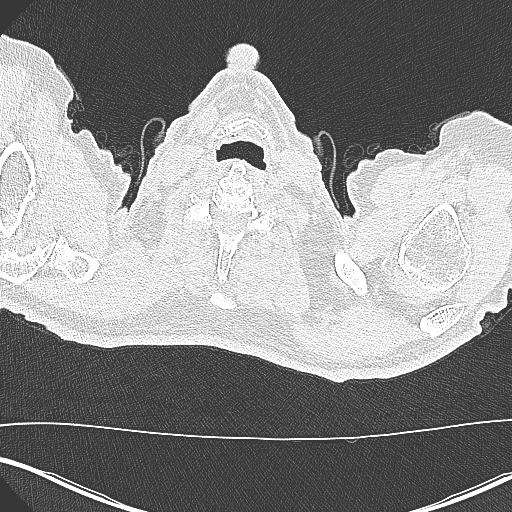

[Series 9: coronal · coronal · 0.73mm/px · 3 of 151 slices shown]
[im 31/151  lung]
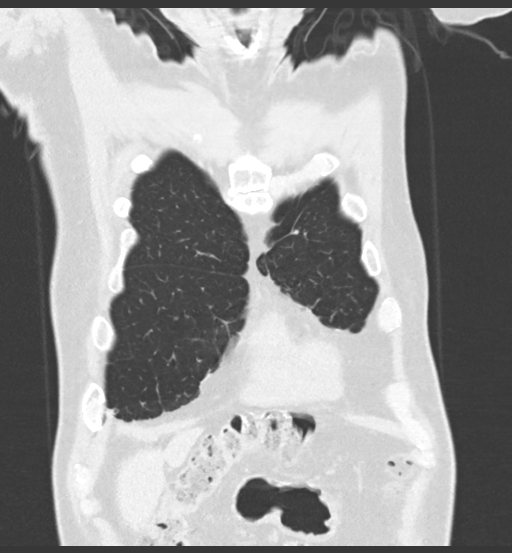
[im 61/151  lung]
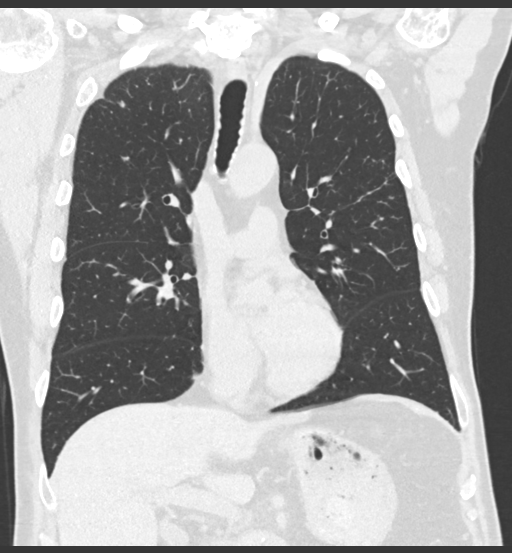
[im 91/151  lung]
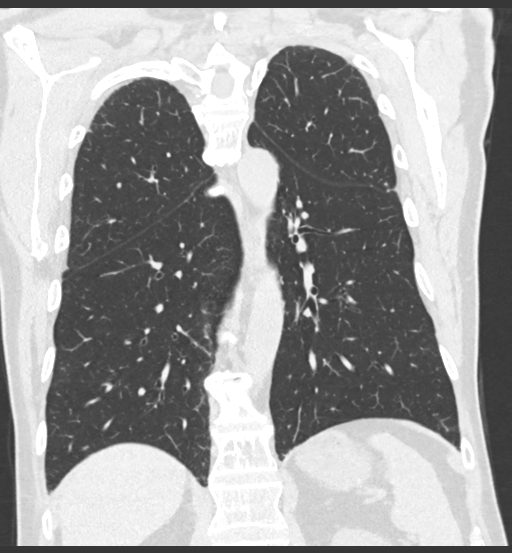

[14 of 36 positions shown; findings below may reference images not displayed]

FINDINGS: Cardiovascular: Left coronary artery calcifications. Normal heart
size. No pericardial effusion.

Mediastinum/Nodes: No enlarged mediastinal, hilar, or axillary lymph
nodes. Thyroid gland, trachea, and esophagus demonstrate no
significant findings.

Lungs/Pleura: No significant interval change in a small cavitary
nodule of the right upper lobe, measuring approximately 0.8 x 0.8 cm
(series 8, image 72). No significant interval change in scattered
small pulmonary nodules and areas of clustered centrilobular
nodularity, for example in the peripheral right upper lobe (series
8, image 132). There is fibrotic scarring and consolidation of the
lingula (series 8, image 237). No significant air trapping on
expiratory phase imaging. No pleural effusion or pneumothorax.

Upper Abdomen: No acute abnormality.

Musculoskeletal: No chest wall mass or suspicious bone lesions
identified.
IMPRESSION: 1. No significant interval change in a small cavitary nodule of the
right upper lobe, measuring approximately 0.8 x 0.8 cm. This remains
nonspecific, most likely infectious or inflammatory in nature, but
given morphology suggest additional follow-up at 1 year to ensure
ongoing stability and exclude a small malignancy.
2. No significant interval change in scattered small pulmonary
nodules and areas of clustered centrilobular nodularity, for example
in the peripheral right upper lobe. There is fibrotic scarring and
consolidation of the lingula. Findings are consistent with sequelae
atypical infection infection, particularly atypical Mycobacterium,
without imaging evidence of ongoing infection.
3. No evidence of fibrotic interstitial lung disease. No significant
air trapping on expiratory phase imaging.
4. Coronary artery disease.

## 2021-11-19 ENCOUNTER — Telehealth: Payer: Self-pay | Admitting: Pharmacist

## 2021-11-19 NOTE — Chronic Care Management (AMB) (Signed)
Patient called with a few questions regarding gas and fatigue.  He is coming for physical in the next few weeks.  Recommended he have Vitamin D checked to see if this could be contributing to fatigue.  Beverly Milch, PharmD, [name] Clinical Pharmacist Practitioner [place] [phone]

## 2021-12-03 ENCOUNTER — Other Ambulatory Visit: Payer: Medicare Other

## 2021-12-03 ENCOUNTER — Other Ambulatory Visit: Payer: Self-pay

## 2021-12-03 DIAGNOSIS — E038 Other specified hypothyroidism: Secondary | ICD-10-CM | POA: Diagnosis not present

## 2021-12-03 DIAGNOSIS — E785 Hyperlipidemia, unspecified: Secondary | ICD-10-CM

## 2021-12-03 DIAGNOSIS — Z13220 Encounter for screening for lipoid disorders: Secondary | ICD-10-CM | POA: Diagnosis not present

## 2021-12-03 DIAGNOSIS — Z136 Encounter for screening for cardiovascular disorders: Secondary | ICD-10-CM | POA: Diagnosis not present

## 2021-12-04 LAB — CBC WITH DIFFERENTIAL/PLATELET
Absolute Monocytes: 567 cells/uL (ref 200–950)
Basophils Absolute: 58 cells/uL (ref 0–200)
Basophils Relative: 1.1 %
Eosinophils Absolute: 111 cells/uL (ref 15–500)
Eosinophils Relative: 2.1 %
HCT: 50.3 % — ABNORMAL HIGH (ref 38.5–50.0)
Hemoglobin: 17.4 g/dL — ABNORMAL HIGH (ref 13.2–17.1)
Lymphs Abs: 1511 cells/uL (ref 850–3900)
MCH: 32.4 pg (ref 27.0–33.0)
MCHC: 34.6 g/dL (ref 32.0–36.0)
MCV: 93.7 fL (ref 80.0–100.0)
MPV: 12.7 fL — ABNORMAL HIGH (ref 7.5–12.5)
Monocytes Relative: 10.7 %
Neutro Abs: 3053 cells/uL (ref 1500–7800)
Neutrophils Relative %: 57.6 %
Platelets: 133 10*3/uL — ABNORMAL LOW (ref 140–400)
RBC: 5.37 10*6/uL (ref 4.20–5.80)
RDW: 13.1 % (ref 11.0–15.0)
Total Lymphocyte: 28.5 %
WBC: 5.3 10*3/uL (ref 3.8–10.8)

## 2021-12-04 LAB — LIPID PANEL
Cholesterol: 132 mg/dL (ref <200)
HDL: 49 mg/dL (ref >=40)
LDL Cholesterol (Calc): 66 mg/dL (calc)
Non-HDL Cholesterol (Calc): 83 mg/dL (calc) (ref <130)
Total CHOL/HDL Ratio: 2.7 (calc) (ref <5.0)
Triglycerides: 87 mg/dL (ref <150)

## 2021-12-04 LAB — COMPREHENSIVE METABOLIC PANEL
AG Ratio: 1.7 (calc) (ref 1.0–2.5)
ALT: 12 U/L (ref 9–46)
AST: 17 U/L (ref 10–35)
Albumin: 4.5 g/dL (ref 3.6–5.1)
Alkaline phosphatase (APISO): 77 U/L (ref 35–144)
BUN: 19 mg/dL (ref 7–25)
CO2: 26 mmol/L (ref 20–32)
Calcium: 9.5 mg/dL (ref 8.6–10.3)
Chloride: 107 mmol/L (ref 98–110)
Creat: 0.96 mg/dL (ref 0.70–1.28)
Globulin: 2.6 g/dL (calc) (ref 1.9–3.7)
Glucose, Bld: 94 mg/dL (ref 65–99)
Potassium: 4.7 mmol/L (ref 3.5–5.3)
Sodium: 143 mmol/L (ref 135–146)
Total Bilirubin: 1.5 mg/dL — ABNORMAL HIGH (ref 0.2–1.2)
Total Protein: 7.1 g/dL (ref 6.1–8.1)

## 2021-12-04 LAB — VITAMIN D 25 HYDROXY (VIT D DEFICIENCY, FRACTURES): Vit D, 25-Hydroxy: 35 ng/mL (ref 30–100)

## 2021-12-04 LAB — HEMOGLOBIN A1C
Hgb A1c MFr Bld: 5.7 % of total Hgb — ABNORMAL HIGH (ref <5.7)
Mean Plasma Glucose: 117 mg/dL
eAG (mmol/L): 6.5 mmol/L

## 2021-12-06 ENCOUNTER — Encounter: Payer: Self-pay | Admitting: Family Medicine

## 2021-12-06 ENCOUNTER — Ambulatory Visit (INDEPENDENT_AMBULATORY_CARE_PROVIDER_SITE_OTHER): Payer: Medicare Other | Admitting: Family Medicine

## 2021-12-06 VITALS — BP 132/82 | HR 58 | Temp 98.0°F | Ht 76.0 in | Wt 187.0 lb

## 2021-12-06 DIAGNOSIS — Z0001 Encounter for general adult medical examination with abnormal findings: Secondary | ICD-10-CM | POA: Diagnosis not present

## 2021-12-06 DIAGNOSIS — E039 Hypothyroidism, unspecified: Secondary | ICD-10-CM

## 2021-12-06 DIAGNOSIS — Z13220 Encounter for screening for lipoid disorders: Secondary | ICD-10-CM

## 2021-12-06 DIAGNOSIS — G4710 Hypersomnia, unspecified: Secondary | ICD-10-CM

## 2021-12-06 DIAGNOSIS — K589 Irritable bowel syndrome without diarrhea: Secondary | ICD-10-CM | POA: Diagnosis not present

## 2021-12-06 DIAGNOSIS — Z136 Encounter for screening for cardiovascular disorders: Secondary | ICD-10-CM

## 2021-12-06 DIAGNOSIS — E038 Other specified hypothyroidism: Secondary | ICD-10-CM

## 2021-12-06 DIAGNOSIS — E785 Hyperlipidemia, unspecified: Secondary | ICD-10-CM

## 2021-12-06 MED ORDER — ATORVASTATIN CALCIUM 10 MG PO TABS: 10.0000 mg | ORAL_TABLET | Freq: Every day | ORAL | 3 refills | Status: DC

## 2021-12-06 MED ORDER — ALPRAZOLAM 0.25 MG PO TABS: 0.2500 mg | ORAL_TABLET | Freq: Every day | ORAL | 0 refills | Status: DC | PRN

## 2021-12-06 MED ORDER — LEVOTHYROXINE SODIUM 100 MCG PO TABS: 100.0000 ug | ORAL_TABLET | Freq: Every day | ORAL | 3 refills | Status: DC

## 2021-12-06 NOTE — Progress Notes (Signed)
Subjective:    Patient ID: Carlos Choi, male    DOB: 05-Nov-1945, 76 y.o.   MRN: [id number]  HPI  Patient is a very pleasant 76 year old Caucasian gentleman here today for complete physical exam.  He denies any falls or depression or memory loss.  He does have occasional anxiety for which he is recently on.  90 tablets lasting more than a year.  He is due for a flu shot in the fall.  Otherwise the remainder of his immunizations are up-to-date including shingles vaccine.  He would like to see a gastroenterologist regarding bloating and gas and fecal urgency.  He states that he has a bowel movement every 3 days but he has sudden gas pains and fecal urgency occasionally and he is worried that this will cause him to have an.  I recommended trying MiraLAX daily to encourage regular bowel movements to see if this cuts down on some of the gas and urgency.  Otherwise he is doing well.  His most recent lab work is excellent.  His cholesterol has improved dramatically. Immunization History  Administered Date(s) Administered   Fluad Quad(high Dose 65+) 03/16/2019   Influenza, High Dose Seasonal PF 04/02/2017, 03/13/2020, 04/09/2021   Influenza,inj,Quad PF,6+ Mos 04/06/2013, 04/06/2014, 04/07/2015, 03/29/2016, 03/10/2018   Influenza-Unspecified 03/17/2012   PFIZER(Purple Top)SARS-COV-2 Vaccination 07/07/2019, 07/28/2019, 03/31/2020, 12/11/2020   Pfizer Covid-19 Vaccine Bivalent Booster 72yr & up 04/09/2021   Pneumococcal Conjugate-13 04/02/2017   Pneumococcal Polysaccharide-23 01/23/2016   Tdap 06/17/2009   Zoster Recombinat (Shingrix) 04/02/2017, 06/02/2017   Orders Only on 12/03/2021  Component Date Value Ref Range Status   Hgb A1c MFr Bld 12/03/2021 5.7 (H)  <5.7 % of total Hgb Final   Comment: For someone without known diabetes, a hemoglobin  A1c value between 5.7% and 6.4% is consistent with prediabetes and should be confirmed with a  follow-up test. . For someone with known diabetes, a  value <7% indicates that their diabetes is well controlled. A1c targets should be individualized based on duration of diabetes, age, comorbid conditions, and other considerations. . This assay result is consistent with an increased risk of diabetes. . Currently, no consensus exists regarding use of hemoglobin A1c for diagnosis of diabetes for children. .    Mean Plasma Glucose 12/03/2021 117  mg/dL Final   eAG (mmol/L) 12/03/2021 6.5  mmol/L Final   Vit D, 25-Hydroxy 12/03/2021 35  30 - 100 ng/mL Final   Comment: Vitamin D Status         25-OH Vitamin D: . Deficiency:                    <20 ng/mL Insufficiency:             20 - 29 ng/mL Optimal:                 > or = 30 ng/mL . For 25-OH Vitamin D testing on patients on  D2-supplementation and patients for whom quantitation  of D2 and D3 fractions is required, the [facility](TM) 25-OH VIT D, (D2,D3), LC/MS/MS is recommended: order  code [id number](patients >237yr. See Note 1 . Note 1 . For additional information, please refer to  http://education.[facility].com/faq/FAQ199  (This link is being provided for informational/ educational purposes only.)    Cholesterol 12/03/2021 132  <200 mg/dL Final   HDL 12/03/2021 49  > OR = 40 mg/dL Final   Triglycerides 12/03/2021 87  <150 mg/dL Final   LDL Cholesterol (Calc) 12/03/2021 66  mg/dL (calc) Final   Comment: Reference range: <100 . Desirable range <100 mg/dL for primary prevention;   <70 mg/dL for patients with CHD or diabetic patients  with > or = 2 CHD risk factors. [name] LDL-C is now calculated using the Martin-Hopkins  calculation, which is a validated novel method providing  better accuracy than the Friedewald equation in the  estimation of LDL-C.  Cresenciano Genre et al. Annamaria Helling. 7371;062(69): 2061-2068  (http://education.[facility].com/faq/FAQ164)    Total CHOL/HDL Ratio 12/03/2021 2.7  <5.0 (calc) Final   Non-HDL Cholesterol (Calc) 12/03/2021 83  <130 mg/dL (calc)  Final   Comment: For patients with diabetes plus 1 major ASCVD risk  factor, treating to a non-HDL-C goal of <100 mg/dL  (LDL-C of <70 mg/dL) is considered a therapeutic  option.    Glucose, Bld 12/03/2021 94  65 - 99 mg/dL Final   Comment: .            Fasting reference interval .    BUN 12/03/2021 19  7 - 25 mg/dL Final   Creat 12/03/2021 0.96  0.70 - 1.28 mg/dL Final   BUN/Creatinine Ratio 48/54/6270 NOT APPLICABLE  6 - 22 (calc) Final   Sodium 12/03/2021 143  135 - 146 mmol/L Final   Potassium 12/03/2021 4.7  3.5 - 5.3 mmol/L Final   Chloride 12/03/2021 107  98 - 110 mmol/L Final   CO2 12/03/2021 26  20 - 32 mmol/L Final   Calcium 12/03/2021 9.5  8.6 - 10.3 mg/dL Final   Total Protein 12/03/2021 7.1  6.1 - 8.1 g/dL Final   Albumin 12/03/2021 4.5  3.6 - 5.1 g/dL Final   Globulin 12/03/2021 2.6  1.9 - 3.7 g/dL (calc) Final   AG Ratio 12/03/2021 1.7  1.0 - 2.5 (calc) Final   Total Bilirubin 12/03/2021 1.5 (H)  0.2 - 1.2 mg/dL Final   Alkaline phosphatase (APISO) 12/03/2021 77  35 - 144 U/L Final   AST 12/03/2021 17  10 - 35 U/L Final   ALT 12/03/2021 12  9 - 46 U/L Final   WBC 12/03/2021 5.3  3.8 - 10.8 Thousand/uL Final   RBC 12/03/2021 5.37  4.20 - 5.80 Million/uL Final   Hemoglobin 12/03/2021 17.4 (H)  13.2 - 17.1 g/dL Final   HCT 12/03/2021 50.3 (H)  38.5 - 50.0 % Final   MCV 12/03/2021 93.7  80.0 - 100.0 fL Final   MCH 12/03/2021 32.4  27.0 - 33.0 pg Final   MCHC 12/03/2021 34.6  32.0 - 36.0 g/dL Final   RDW 12/03/2021 13.1  11.0 - 15.0 % Final   Platelets 12/03/2021 133 (L)  140 - 400 Thousand/uL Final   MPV 12/03/2021 12.7 (H)  7.5 - 12.5 fL Final   Neutro Abs 12/03/2021 3,053  1,500 - 7,800 cells/uL Final   Lymphs Abs 12/03/2021 1,511  850 - 3,900 cells/uL Final   Absolute Monocytes 12/03/2021 567  200 - 950 cells/uL Final   Eosinophils Absolute 12/03/2021 111  15 - 500 cells/uL Final   Basophils Absolute 12/03/2021 58  0 - 200 cells/uL Final   Neutrophils Relative  % 12/03/2021 57.6  % Final   Total Lymphocyte 12/03/2021 28.5  % Final   Monocytes Relative 12/03/2021 10.7  % Final   Eosinophils Relative 12/03/2021 2.1  % Final   Basophils Relative 12/03/2021 1.1  % Final     Past Medical History:  Diagnosis Date   Allergy    Anxiety    Arthritis    mild ankles  Cataract    removed left eye, forming right eye    Hemorrhoids    Hypothyroid    Rectal bleeding    due to hemorrhoid    Past Surgical History:  Procedure Laterality Date   CATARACT EXTRACTION Left    COLONOSCOPY     HERNIA REPAIR     Over ten years ago per patient. He was not sure of the date.   POLYPECTOMY     ROTATOR CUFF REPAIR     both shoulders - patient does not remember the exact date   Current Outpatient Medications on File Prior to Visit  Medication Sig Dispense Refill   aspirin EC 81 MG tablet Take 81 mg by mouth every other day. Swallow whole.     loratadine (CLARITIN) 10 MG tablet Take 10 mg by mouth daily.     Multiple Vitamins-Minerals (CENTRUM SILVER PO) Take 1 tablet by mouth daily.     Omega-3 Fatty Acids (FISH OIL) 1000 MG CAPS Take 3 capsules by mouth daily.     Lactobacillus-Inulin ([place] PO) Take by mouth. (Patient not taking: Reported on 12/06/2021)     No current facility-administered medications on file prior to visit.   No Known Allergies Social History   Socioeconomic History   Marital status: Married    Spouse name: Not on file   Number of children: Not on file   Years of education: Not on file   Highest education level: Not on file  Occupational History   Not on file  Tobacco Use   Smoking status: Former    Packs/day: 0.50    Years: 6.00    Total pack years: 3.00    Types: Cigarettes    Quit date: 06/18/1979    Years since quitting: 42.4   Smokeless tobacco: Never  Substance and Sexual Activity   Alcohol use: Yes    Comment: 2 beers Monthly.   Drug use: No   Sexual activity: Not on file  Other Topics  Concern   Not on file  Social History Narrative   Not on file   Social Determinants of Health   Financial Resource Strain: Low Risk  (10/27/2020)   Overall Financial Resource Strain (CARDIA)    Difficulty of Paying Living Expenses: Not hard at all  Food Insecurity: No Food Insecurity (10/27/2020)   Hunger Vital Sign    Worried About Running Out of Food in the Last Year: Never true    [place] in the Last Year: Never true  Transportation Needs: No Transportation Needs (10/27/2020)   PRAPARE - Hydrologist (Medical): No    Lack of Transportation (Non-Medical): No  Physical Activity: Sufficiently Active (10/27/2020)   Exercise Vital Sign    Days of Exercise per Week: 7 days    Minutes of Exercise per Session: 30 min  Stress: No Stress Concern Present (10/27/2020)   [place]    Feeling of Stress : Not at all  Social Connections: Moderately Integrated (10/27/2020)   Social Connection and Isolation Panel [NHANES]    Frequency of Communication with Friends and Family: More than three times a week    Frequency of Social Gatherings with Friends and Family: Three times a week    Attends Religious Services: More than 4 times per year    Active Member of Clubs or Organizations: No    Attends Archivist Meetings: Never    Marital Status:  Married  Intimate Partner Violence: Not At Risk (10/27/2020)   Humiliation, Afraid, Rape, and Kick questionnaire    Fear of Current or Ex-Partner: No    Emotionally Abused: No    Physically Abused: No    Sexually Abused: No   Family History  Problem Relation Age of Onset   Aneurysm Mother    Alzheimer's disease Father    Cancer Sister        ovarian   Stroke Brother    Hypertension Brother    Colon cancer Neg Hx    Esophageal cancer Neg Hx    Stomach cancer Neg Hx    Rectal cancer Neg Hx    Colon polyps Neg Hx      Review of Systems      Objective:   Physical Exam Vitals reviewed.  Constitutional:      General: He is not in acute distress.    Appearance: Normal appearance. He is normal weight. He is not ill-appearing, toxic-appearing or diaphoretic.  HENT:     Head: Normocephalic and atraumatic.     Right Ear: Tympanic membrane, ear canal and external ear normal. There is no impacted cerumen.     Left Ear: Tympanic membrane, ear canal and external ear normal. There is no impacted cerumen.     Nose: Nose normal. No congestion or rhinorrhea.     Mouth/Throat:     Mouth: Mucous membranes are moist.     Pharynx: Oropharynx is clear. No oropharyngeal exudate or posterior oropharyngeal erythema.  Eyes:     General: No scleral icterus.       Right eye: No discharge.        Left eye: No discharge.     Extraocular Movements: Extraocular movements intact.     Conjunctiva/sclera: Conjunctivae normal.     Pupils: Pupils are equal, round, and reactive to light.  Neck:     Vascular: No carotid bruit.  Cardiovascular:     Rate and Rhythm: Normal rate and regular rhythm.     Pulses: Normal pulses.     Heart sounds: Normal heart sounds. No murmur heard.    No friction rub. No gallop.  Pulmonary:     Effort: Pulmonary effort is normal. No respiratory distress.     Breath sounds: Normal breath sounds. No wheezing, rhonchi or rales.  Abdominal:     General: Abdomen is flat. Bowel sounds are normal. There is no distension.     Palpations: Abdomen is soft. There is no mass.     Tenderness: There is no abdominal tenderness. There is no guarding or rebound.     Hernia: No hernia is present.  Musculoskeletal:        General: No swelling, tenderness or deformity.     Cervical back: Normal range of motion and neck supple. No rigidity or tenderness.     Right lower leg: No edema.     Left lower leg: No edema.  Lymphadenopathy:     Cervical: No cervical adenopathy.  Skin:    General: Skin is warm and dry.     Coloration: Skin is  not jaundiced or pale.     Findings: No bruising, erythema, lesion or rash.  Neurological:     General: No focal deficit present.     Mental Status: He is alert and oriented to person, place, and time. Mental status is at baseline.     Cranial Nerves: No cranial nerve deficit.     Sensory: No sensory deficit.  Motor: No weakness.     Coordination: Coordination normal.     Gait: Gait normal.     Deep Tendon Reflexes: Reflexes normal.  Psychiatric:        Mood and Affect: Mood normal.        Behavior: Behavior normal.        Thought Content: Thought content normal.        Judgment: Judgment normal.           Assessment & Plan:  Hypersomnolence - Plan: Ambulatory referral to Sleep Studies  Irritable bowel syndrome, unspecified type - Plan: ALPRAZolam (XANAX) 0.25 MG tablet  Hyperlipidemia with target LDL less than 100 - Plan: atorvastatin (LIPITOR) 10 MG tablet  Hypothyroidism, unspecified type - Plan: levothyroxine (SYNTHROID) 100 MCG tablet  Other specified hypothyroidism - Plan: levothyroxine (SYNTHROID) 100 MCG tablet  Encounter for lipid screening for cardiovascular disease - Plan: levothyroxine (SYNTHROID) 100 MCG tablet Physical exam today is excellent.  I see no abnormalities.  Blood pressure is outstanding.  Patient will have his PSA checked with his urologist.  His cholesterol is outstanding.  However I am concerned that his hemoglobin is slightly elevated.  Upon further discussion, the patient reports hypersomnolence.  He states that he is always tired.  He feels like he can fall asleep easily.  I believe this may be causing some of his polycythemia.  He is interested in a sleep study to evaluate for obstructive sleep apnea.  I will schedule him for this.

## 2021-12-20 ENCOUNTER — Telehealth: Payer: Medicare Other

## 2022-01-23 DIAGNOSIS — R3914 Feeling of incomplete bladder emptying: Secondary | ICD-10-CM | POA: Diagnosis not present

## 2022-01-23 DIAGNOSIS — N401 Enlarged prostate with lower urinary tract symptoms: Secondary | ICD-10-CM | POA: Diagnosis not present

## 2022-01-23 DIAGNOSIS — R3912 Poor urinary stream: Secondary | ICD-10-CM | POA: Diagnosis not present

## 2022-01-28 ENCOUNTER — Institutional Professional Consult (permissible substitution): Payer: Medicare Other | Admitting: Neurology

## 2022-02-05 ENCOUNTER — Encounter: Payer: Self-pay | Admitting: Physician Assistant

## 2022-02-13 DIAGNOSIS — R351 Nocturia: Secondary | ICD-10-CM | POA: Diagnosis not present

## 2022-02-13 DIAGNOSIS — N401 Enlarged prostate with lower urinary tract symptoms: Secondary | ICD-10-CM | POA: Diagnosis not present

## 2022-02-13 DIAGNOSIS — R3914 Feeling of incomplete bladder emptying: Secondary | ICD-10-CM | POA: Diagnosis not present

## 2022-02-13 DIAGNOSIS — R3912 Poor urinary stream: Secondary | ICD-10-CM | POA: Diagnosis not present

## 2022-02-19 DIAGNOSIS — L821 Other seborrheic keratosis: Secondary | ICD-10-CM | POA: Diagnosis not present

## 2022-02-19 DIAGNOSIS — L565 Disseminated superficial actinic porokeratosis (DSAP): Secondary | ICD-10-CM | POA: Diagnosis not present

## 2022-02-19 DIAGNOSIS — L570 Actinic keratosis: Secondary | ICD-10-CM | POA: Diagnosis not present

## 2022-02-20 ENCOUNTER — Institutional Professional Consult (permissible substitution): Payer: Medicare Other | Admitting: Neurology

## 2022-03-06 NOTE — Progress Notes (Signed)
03/11/2022 Carlos Choi [id number] [birth year]  Referring provider: Susy Frizzle, MD Primary GI doctor: Dr. Fuller Plan  ASSESSMENT AND PLAN:   Assessment: 76 y.o. male here for assessment of the following: 1. Change in bowel habits   2. Irritable bowel syndrome with both constipation and diarrhea   3. Gastroesophageal reflux disease without esophagitis   4. Adenomatous polyp of colon, unspecified part of colon   5. Bronchiectasis without complication ([place])   6. Hypothyroidism, unspecified type    With history and symptoms most consistent with IBS, worse with stress, travel, better with xanax 2020 Colonoscopy showed good bowel prep moderate diverticulosis, internal hemorrhoids,   Due 2027  Plan: Can do trial of IBGARD, and will give Levsin as needed Can do trial of benefiber/citracel Avoid lactulose. FODMAP and lifestyle changes discussed.  Consider xifaxin trial/testing for SIBO Consider trial of SSRI with PPI since xanax helps  Meds ordered this encounter  Medications   hyoscyamine (LEVSIN) 0.125 MG tablet    Sig: Take 1 tablet (0.125 mg total) by mouth every 6 (six) hours as needed for cramping (diarrhea).    Dispense:  30 tablet    Refill:  0       Patient Care Team: Susy Frizzle, MD as PCP - General (Family Medicine) Edythe Clarity, [facility] as Pharmacist (Pharmacist)  HISTORY OF PRESENT ILLNESS: 77 y.o. male with a past medical history of bronchiectasis, hypothyroidism, elevated PSA, personal history of hemorrhoids and adenomatous polyps and others listed below presents for evaluation of severe gas/bloating.   12/16/2018 colonoscopy with Dr. Fuller Plan personal history of colon polyps good bowel prep moderate diverticulosis, internal hemorrhoids,  recall 7 years.  Due 12/2025  Patient states for a long time, he has had "nervous stomach forever" like states when he was in college would vomit before games, but last few years has gotten worse where he is not  as comfortable and does not want to go out as often as he normally does.   He states when he is about to go some place he would have to go to the bathroom and GERD.  IE. He had BM x 2 this AM, felt he did not have complete BM had 2nd BM and feels much better now.  He states he will not go places due to indigestion or feeling he may have to have a BM.  He also has a lot of gas.  No weight loss.   He can have 2 days between BM's, will have harder stools bristol stool 2-3 but can have loose stools bristol scale 6 worse after hemorrhoid surgery.  No blood in the stool.  He has incomplete BM's, has to go 30 mins after. He has some AB pain prior to BM better afterwards.  He has been on rapaflo for several months and feels this has helped the gas.  He takes tums and this helps with indigestion after eating, occ the xanax will help as well. Mainly when he is going anywhere or traveling can happen.  States very rare will have some swallowing issues, states worse if he does not chew very well but over all no dysphagia or odynophagia.   Will take the xanax when he is traveling and this helps his symptoms overall.   He  reports that he quit smoking about 42 years ago. His smoking use included cigarettes. He has a 3.00 pack-year smoking history. He has never used smokeless tobacco. He reports current alcohol use. He reports that  he does not use drugs.  Current Medications:   Current Outpatient Medications (Endocrine & Metabolic):    levothyroxine (SYNTHROID) 100 MCG tablet, Take 1 tablet (100 mcg total) by mouth daily.  Current Outpatient Medications (Cardiovascular):    atorvastatin (LIPITOR) 10 MG tablet, Take 1 tablet (10 mg total) by mouth daily.  Current Outpatient Medications (Respiratory):    loratadine (CLARITIN) 10 MG tablet, Take 10 mg by mouth daily.  Current Outpatient Medications (Analgesics):    aspirin EC 81 MG tablet, Take 81 mg by mouth every other day. Swallow whole.   Current  Outpatient Medications (Other):    ALPRAZolam (XANAX) 0.25 MG tablet, Take 1 tablet (0.25 mg total) by mouth daily as needed for anxiety.   hyoscyamine (LEVSIN) 0.125 MG tablet, Take 1 tablet (0.125 mg total) by mouth every 6 (six) hours as needed for cramping (diarrhea).   Lactobacillus-Inulin ([place]), Take by mouth.   Multiple Vitamins-Minerals (CENTRUM SILVER PO), Take 1 tablet by mouth daily.   Omega-3 Fatty Acids (FISH OIL) 1000 MG CAPS, Take 3 capsules by mouth daily.   silodosin (RAPAFLO) 8 MG CAPS capsule, Take 8 mg by mouth daily.  Medical History:  Past Medical History:  Diagnosis Date   Allergy    Anxiety    Arthritis    mild ankles    Cataract    removed left eye, forming right eye    Diverticulosis    Hemorrhoids    Hypothyroid    Kidney stones    Rectal bleeding    due to hemorrhoid    Tubular adenoma of colon 2015   Allergies: No Known Allergies   Surgical History:  He  has a past surgical history that includes Hernia repair (Bilateral); Rotator cuff repair; Cataract extraction (Left); Colonoscopy; and Polypectomy. Family History:  His family history includes Alzheimer's disease in his father; Aneurysm in his mother; Cancer in his sister; Hypertension in his brother; Stroke in his brother.  REVIEW OF SYSTEMS  : All other systems reviewed and negative except where noted in the History of Present Illness.  PHYSICAL EXAM: BP 118/70   Pulse (!) 57   Ht '6\' 4"'$  (1.93 m)   Wt 187 lb (84.8 kg)   BMI 22.76 kg/m  General:   Pleasant, well developed male in no acute distress Head:   Normocephalic and atraumatic. Eyes:  sclerae anicteric,conjunctive pink  Heart:   regular rate and rhythm Pulm:  Clear anteriorly; no wheezing Abdomen:   Soft, Flat AB, Active bowel sounds. No tenderness . , No organomegaly appreciated. Rectal: Not evaluated Extremities:  Without edema. Msk: Symmetrical without gross deformities. Peripheral pulses intact.   Neurologic:  Alert and  oriented x4;  No focal deficits.  Skin:   Dry and intact without significant lesions or rashes. Psychiatric:  Cooperative. Normal mood and affect.  Vladimir Crofts, PA-C 11:04 AM

## 2022-03-11 ENCOUNTER — Ambulatory Visit: Payer: Medicare Other | Admitting: Physician Assistant

## 2022-03-11 ENCOUNTER — Encounter: Payer: Self-pay | Admitting: Physician Assistant

## 2022-03-11 VITALS — BP 118/70 | HR 57 | Ht 76.0 in | Wt 187.0 lb

## 2022-03-11 DIAGNOSIS — R194 Change in bowel habit: Secondary | ICD-10-CM | POA: Diagnosis not present

## 2022-03-11 DIAGNOSIS — K582 Mixed irritable bowel syndrome: Secondary | ICD-10-CM | POA: Diagnosis not present

## 2022-03-11 DIAGNOSIS — E039 Hypothyroidism, unspecified: Secondary | ICD-10-CM

## 2022-03-11 DIAGNOSIS — D126 Benign neoplasm of colon, unspecified: Secondary | ICD-10-CM | POA: Diagnosis not present

## 2022-03-11 DIAGNOSIS — J479 Bronchiectasis, uncomplicated: Secondary | ICD-10-CM

## 2022-03-11 DIAGNOSIS — K219 Gastro-esophageal reflux disease without esophagitis: Secondary | ICD-10-CM | POA: Diagnosis not present

## 2022-03-11 MED ORDER — HYOSCYAMINE SULFATE 0.125 MG PO TABS: 0.1250 mg | ORAL_TABLET | Freq: Four times a day (QID) | ORAL | 0 refills | Status: DC | PRN

## 2022-03-11 NOTE — Patient Instructions (Addendum)
Follow up in 2 months with Estill Bamberg.  We have sent the following medications to your pharmacy for you to pick up at your convenience: [place] do a trial off milk/lactose products if you use them.  Add fiber like benefiber or citracel once a day, can do colace Increase activity Can do trial of IBGard which is over the counter for AB pain- Take 1-2 capsules once a day for maintence or twice a day during a flare Can send in an anti spasm medication, Levsin, to take as needed Please try to decrease stress. consider talking with PCP about anti anxiety medication or try head space app for meditation. if any worsening symptoms like blood in stool, weight loss, please call the office   We may want to evaluate you for small intestinal bacterial overgrowth, this can cause increase gas, bloating, loose stools or constipation.  There is a test for this we can do or sometimes we will treat a patient with an antibiotic to see if it helps.    Please try low FODMAP diet- see below- start with eliminating just one column at a time, the table at the very bottom contains foods that are safe to take   FODMAP stands for fermentable oligo-, di-, mono-saccharides and polyols (1). These are the scientific terms used to classify groups of carbs that are notorious for triggering digestive symptoms like bloating, gas and stomach pain.      It was a pleasure to see you today!  Thank you for trusting me with your gastrointestinal care!

## 2022-03-15 DIAGNOSIS — R7303 Prediabetes: Secondary | ICD-10-CM | POA: Diagnosis not present

## 2022-03-15 DIAGNOSIS — I10 Essential (primary) hypertension: Secondary | ICD-10-CM | POA: Diagnosis not present

## 2022-03-15 DIAGNOSIS — E7800 Pure hypercholesterolemia, unspecified: Secondary | ICD-10-CM | POA: Diagnosis not present

## 2022-03-27 ENCOUNTER — Telehealth: Payer: Self-pay | Admitting: Pharmacist

## 2022-03-27 NOTE — Progress Notes (Signed)
Opened in error

## 2022-04-03 ENCOUNTER — Encounter: Payer: Self-pay | Admitting: Family Medicine

## 2022-04-05 DIAGNOSIS — Z23 Encounter for immunization: Secondary | ICD-10-CM | POA: Diagnosis not present

## 2022-04-16 ENCOUNTER — Ambulatory Visit: Payer: Medicare Other | Admitting: Gastroenterology

## 2022-05-14 ENCOUNTER — Ambulatory Visit: Payer: Medicare Other | Admitting: Physician Assistant

## 2022-06-06 ENCOUNTER — Telehealth: Payer: Self-pay

## 2022-06-06 ENCOUNTER — Other Ambulatory Visit: Payer: Self-pay | Admitting: Family Medicine

## 2022-06-06 MED ORDER — AMOXICILLIN 875 MG PO TABS: 875.0000 mg | ORAL_TABLET | Freq: Two times a day (BID) | ORAL | 0 refills | Status: AC

## 2022-06-06 NOTE — Telephone Encounter (Signed)
Pt called and states he is having issues with nasal and chest congestion x 1 week. Pt had low grade temp last week but none recently. Pt c/o blood tinged congestion for the last 2 days. Pt asks if an abx could be sent in? Thank you.

## 2022-06-14 ENCOUNTER — Telehealth: Payer: Self-pay | Admitting: Internal Medicine

## 2022-06-14 MED ORDER — LEVOFLOXACIN 500 MG PO TABS: 500.0000 mg | ORAL_TABLET | Freq: Every day | ORAL | 0 refills | Status: AC

## 2022-06-14 NOTE — Telephone Encounter (Signed)
Remind he has flutter valve. If mucu is still nasty p amox needs levaquin 500 mg daily x 5 days then ov at end of 5 days ok to add on to pm

## 2022-06-14 NOTE — Telephone Encounter (Signed)
Called and patients wife and gave her recs she voiced understanding Nothing further needed.

## 2022-06-14 NOTE — Telephone Encounter (Signed)
  Primary Pulmonologist: Dr. Melvyn Novas  Last office visit and with whom: 11/03/2020 Dr. Melvyn Novas  What do we see them for (pulmonary problems): bronchiectasis  Last OV assessment/plan: see below   Was appointment offered to patient (explain)?  Patient has an appt 07/08/2022   Reason for call: Called and spoke with patient. He states for the last few days he has had increased cough with yellow phlegm. He states that he has had 3 or 4 coughing spells where he coughed up a lot of thick yellow sputum and other than that it feels like the sputum is sitting in his chest causing some chest congestion. He was prescribed amoxicillin 800 mg by PCP and has 2 days left. He has been taking mucinex every 12 hours as well but feels the mucus is still in his chest and hard to get up. Denies increased SOB/ wheezing/ fever. Has an appt 1/22 but would like recommendations on something else he can try before then   [facility]    No Known Allergies  Immunization History  Administered Date(s) Administered   Fluad Quad(high Dose 65+) 03/16/2019   Influenza, High Dose Seasonal PF 04/02/2017, 03/13/2020, 04/09/2021   Influenza,inj,Quad PF,6+ Mos 04/06/2013, 04/06/2014, 04/07/2015, 03/29/2016, 03/10/2018   Influenza-Unspecified 03/17/2012, 04/04/2022   PFIZER(Purple Top)SARS-COV-2 Vaccination 07/07/2019, 07/28/2019, 03/31/2020, 12/11/2020   Pfizer Covid-19 Vaccine Bivalent Booster 41yr & up 04/09/2021   Pneumococcal Conjugate-13 04/02/2017   Pneumococcal Polysaccharide-23 01/23/2016   Tdap 06/17/2009   Zoster Recombinat (Shingrix) 04/02/2017, 06/02/2017          Assessment & Plan Note by WTanda Rockers MD at 11/03/2020 1:33 PM  Author: WTanda Rockers MD Author Type: Physician Filed: 11/03/2020  1:35 PM  Note Status: Written Cosign: Cosign Not Required Encounter Date: 11/03/2020  Problem: Bronchiectasis without complication ([facility]  Editor: WTanda Rockers MD (Physician)             Onset of symptoms  July 2021 - Quant TB/ anca serologies neg 01/28/20 and convincing clinical response to zmax/augmentin rx  - CT chest 06/28/2020 No significant interval change in a small cavitary nodule of the right upper lobe, measuring approximately 0.8 x 0.8 cm. This remains nonspecific, most likely infectious or inflammatory in nature, but given morphology suggest additional follow-up at 1 year to ensure ongoing stability and exclude a small malignancy>  Placed in reminder file for 06/28/21  - Flutter valve training 11/03/2020    Reviewed again with pt and wife pathophysiology of bronchiectasis including the mucociliary escalator problem using escalator analogy. No need for abx or maint inhalers at this point   Needs to return with pfts/ alpha one and Ig Testing on return            Each maintenance medication was reviewed in detail including emphasizing most importantly the difference between maintenance and prns and under what circumstances the prns are to be triggered using an action plan format where appropriate.   Total time for H and P, chart review, counseling, reviewing flutter valve  device(s) and generating customized AVS unique to this office visit / same day charting  > 30 min

## 2022-06-21 NOTE — Telephone Encounter (Signed)
Called and spoke to patient. Missed a call from patient yesterday. Called patient back and he states he did pick up levaquin and is feeling better. Does not feel like he needs another round of medication or further treatment for now. He is using his flutter valve a few times a day. Patient has an appt on 07/08/22 and states he will call if he feels he needs to be seen before that.

## 2022-07-07 NOTE — Progress Notes (Signed)
Carlos Choi, male    DOB: 21-Aug-1945       MRN: [id number]   Brief patient profile:  17  yowm minimal smoking hx with acute purulent bronchitis in Late July 2021 resolved on zmax/augmentin clinically but f/u cxr/ ct concerning for cavitary nodules so referred to pulmonary clinic in [place]  05/18/2020 by Carlos  Dennard Choi with neg Quant TB and anca aleady done       History of Present Illness  05/18/2020  Pulmonary/ 1st office eval/ Carlos Choi / [facility]  Chief Complaint  Patient presents with   Pulmonary Consult    Referred by Carlos. Dennard Choi for eval of cavitary lesion. Pt states that he has had cough since Jan 2021. This has been much improved since round of augmentin taken Aug 2021. He does have some cough in the am with clear sputum.   Dyspnea:  Not limited, very active  Cough: a little of stick white mucus in am not every day  Sleep: 30 degrees on electric SABA use: none rec Bronchiectasis =   you have scarring of your bronchial tubes which means that they don't function perfectly normally and mucus tends to pool in certain areas of your lung which can cause pneumonia and further scarring of your lung and bronchial tubes Whenever you develop cough congestion take mucinex (wet cough) or mucinex dm (dry cough) up to 1200 mg every 12hours    Phone note  10/13/20   He states dx with covid 19 on 10/08/20- called PCP 10/10/20 and they put him on paxlovid  He is feeling much improved     11/03/2020  f/u ov/[place] office/Carlos Choi re: bronchiectasis  Chief Complaint  Patient presents with   Follow-up    Productive cough with clear and sometimes yellow phlegm   Dyspnea:  Walking daily up to 1.5 miles per day s sob Cough: baselinecough in am / clear mucus - not sure took mucinex dm in max doses but got thru covid fine p paxlovid, no abx Sleeping: 30 degrees hob  SABA use: none  02: none  Covid status: x 3  Rec For cough / congestion > mucinex dm 1200 mg every 12 hours as needed and  flutter valve as much as you want if you feel it's helping  Schedule PFTs next available at [facility]          07/08/2022  f/u ov/Carlos Choi re: bronchiectasis  maint  mucinex dm/ flutter valve only  Chief Complaint  Patient presents with   Follow-up    Doing well today.  Did have viral infection December 2023.  A lot of mucus in sinus and chest.  Completed levoquin and amoxil.  Using flutter valve when needed.  Dyspnea:  walking regularly up hills too s limiting sob  Cough: minimal am  white glob and easy to clear x several years / using flutter valve  Sleeping: 30 degrees hob  SABA use: none  02: none       No obvious day to day or daytime variability or assoc excess/ purulent sputum or mucus plugs or hemoptysis or cp or chest tightness, subjective wheeze  - some HB rx otc   Sleeping  without nocturnal  or early am exacerbation  of respiratory  c/o's or need for noct saba. Also denies any obvious fluctuation of symptoms with weather or environmental changes or other aggravating or alleviating factors except as outlined above   No unusual exposure hx or h/o childhood pna/ asthma or knowledge of  premature birth.  Current Allergies, Complete Past Medical History, Past Surgical History, Family History, and Social History were reviewed in [employer] record.  ROS  The following are not active complaints unless bolded Hoarseness, sore throat, dysphagia, dental problems, itching, sneezing,  nasal congestion or discharge of excess mucus or purulent secretions, ear ache,   fever, chills, sweats, unintended wt loss or wt gain, classically pleuritic or exertional cp,  orthopnea pnd or arm/hand swelling  or leg swelling, presyncope, palpitations, abdominal pain, anorexia, nausea, vomiting, diarrhea  or change in bowel habits or change in bladder habits, change in stools or change in urine, dysuria, hematuria,  rash, arthralgias, visual complaints, headache, numbness, weakness or  ataxia or problems with walking or coordination,  change in mood or  memory.        Current Meds  Medication Sig   ALPRAZolam (XANAX) 0.25 MG tablet Take 1 tablet (0.25 mg total) by mouth daily as needed for anxiety.   aspirin EC 81 MG tablet Take 81 mg by mouth every other day. Swallow whole.   atorvastatin (LIPITOR) 10 MG tablet Take 1 tablet (10 mg total) by mouth daily.   hyoscyamine (LEVSIN) 0.125 MG tablet Take 1 tablet (0.125 mg total) by mouth every 6 (six) hours as needed for cramping (diarrhea).   Lactobacillus-Inulin ([place] PO) Take by mouth.   levothyroxine (SYNTHROID) 100 MCG tablet Take 1 tablet (100 mcg total) by mouth daily.   loratadine (CLARITIN) 10 MG tablet Take 10 mg by mouth daily.   Multiple Vitamins-Minerals (CENTRUM SILVER PO) Take 1 tablet by mouth daily.   Omega-3 Fatty Acids (FISH OIL) 1000 MG CAPS Take 3 capsules by mouth daily.   silodosin (RAPAFLO) 8 MG CAPS capsule Take 8 mg by mouth daily.                    Past Medical History:  Diagnosis Date   Allergy    Anxiety    Arthritis    mild ankles    Cataract    removed left eye, forming right eye    Hemorrhoids    Hypothyroid    Rectal bleeding    due to hemorrhoid       Objective:    Wts  07/08/2022       186  11/03/20 190 lb 12.8 oz (86.5 kg)  05/18/20 194 lb (88 kg)  01/28/20 192 lb (87.1 kg)    Vital signs reviewed  07/08/2022  - Note at rest 02 sats  100% on RA   General appearance:    robust pleasant amb wm nad   HEENT : Oropharynx  clear     Nasal turbinates non-specific swelling/ no secretions    NECK :  without  apparent JVD/ palpable Nodes/TM    LUNGS: no acc muscle use,  Nl contour chest which is clear to A and P bilaterally without cough on insp or exp maneuvers   CV:  RRR  no s3 or murmur or increase in P2, and no edema   ABD:  soft and nontender with nl inspiratory excursion in the supine position. No bruits or organomegaly appreciated   MS:   Nl gait/ ext warm without deformities Or obvious joint restrictions  calf tenderness, cyanosis or clubbing    SKIN: warm and dry without lesions    NEURO:  alert, approp, nl sensorium with  no motor or cerebellar deficits apparent.  I personally reviewed images and agree with radiology impression as follows:   Chest HRCT 08/01/21 1. Overall, the appearance the chest is very similar to the prior study, suggestive of chronic indolent atypical infection such as MAI (mycobacterium avium intracellulare). 2. Stable small thick-walled cavitary lesion in the right upper lobe measuring 7 x 8 mm, likely part of this chronic atypical infectious process, however, continued attention on follow-up studies is recommended. 3. Air trapping indicative of small airways disease. 4. Aortic atherosclerosis, in addition to left main and left anterior descending coronary artery disease. Assessment for potential risk factor modification, dietary therapy or pharmacologic therapy may be warranted, if clinically indicated.       Assessment

## 2022-07-08 ENCOUNTER — Encounter: Payer: Self-pay | Admitting: Internal Medicine

## 2022-07-08 ENCOUNTER — Ambulatory Visit: Payer: Medicare Other | Admitting: Internal Medicine

## 2022-07-08 VITALS — BP 128/70 | HR 53 | Temp 97.7°F | Ht 76.0 in | Wt 186.8 lb

## 2022-07-08 DIAGNOSIS — J479 Bronchiectasis, uncomplicated: Secondary | ICD-10-CM

## 2022-07-08 NOTE — Patient Instructions (Addendum)
Bronchiectasis is your diagnosis   My office will be contacting you by phone for referral to [facility] sinus and chest ct   - if you don't hear back from my office within one week please call us back or notify us thru MyChart and we'll address it right away.   Please remember to go to the lab department   for your tests - we will call you with the results when they are available.      Please schedule a follow up visit in 12 months but call sooner if needed

## 2022-07-09 ENCOUNTER — Encounter: Payer: Self-pay | Admitting: Internal Medicine

## 2022-07-09 LAB — IGG, IGA, IGM
IgG (Immunoglobin G), Serum: 1191 mg/dL (ref 600–1540)
IgM, Serum: 104 mg/dL (ref 50–300)
Immunoglobulin A: 205 mg/dL (ref 70–320)

## 2022-07-09 NOTE — Assessment & Plan Note (Addendum)
Onset of symptoms July 2021 - Quant TB/ anca serologies neg 01/28/20 and convincing clinical response to zmax/augmentin rx  - CT chest 06/28/2020 No significant interval change in a small cavitary nodule of the right upper lobe, measuring approximately 0.8 x 0.8 cm. This remains nonspecific, most likely infectious or inflammatory in nature, but given morphology suggest additional follow-up at 1 year to ensure ongoing stability and exclude a small malignancy>  Placed in reminder file for 06/28/21  - Flutter valve training 11/03/2020  - PFTs  11/14/20  [facility]  - CT s contrast [facility] 07/08/2022 >>>  -  Labs ordered 07/08/2022  :    quant Ig's nl     one AT phenotype pending   No change rx pending completion of w/u with above studies         Each maintenance medication was reviewed in detail including emphasizing most importantly the difference between maintenance and prns and under what circumstances the prns are to be triggered using an action plan format where appropriate.  Total time for H and P, chart review, counseling, reviewing flutter  device(s) and generating customized AVS unique to this office visit / same day charting = 21 min

## 2022-07-13 LAB — ALPHA-1-ANTITRYPSIN PHENOTYP: A-1 Antitrypsin: 158 mg/dL (ref 101–187)

## 2022-07-15 ENCOUNTER — Telehealth: Payer: Self-pay | Admitting: Internal Medicine

## 2022-07-15 NOTE — Telephone Encounter (Signed)
Tanda Rockers, MD 07/14/2022  7:08 AM EST Back to Top    Call patient :  Studies are unremarkable, no change in recs   Spoke with pt and notified of results per Dr. Melvyn Novas. Pt verbalized understanding and denied any questions.

## 2022-07-24 ENCOUNTER — Ambulatory Visit (HOSPITAL_COMMUNITY)
Admission: RE | Admit: 2022-07-24 | Discharge: 2022-07-24 | Disposition: A | Discharge status: Home / Self Care | Payer: Medicare Other | Source: Ambulatory Visit | Attending: Internal Medicine | Admitting: Internal Medicine

## 2022-07-24 DIAGNOSIS — J479 Bronchiectasis, uncomplicated: Secondary | ICD-10-CM | POA: Diagnosis not present

## 2022-07-24 DIAGNOSIS — R0982 Postnasal drip: Secondary | ICD-10-CM | POA: Diagnosis not present

## 2022-07-24 DIAGNOSIS — R918 Other nonspecific abnormal finding of lung field: Secondary | ICD-10-CM | POA: Diagnosis not present

## 2022-07-29 ENCOUNTER — Other Ambulatory Visit: Payer: Self-pay

## 2022-07-29 DIAGNOSIS — J329 Chronic sinusitis, unspecified: Secondary | ICD-10-CM

## 2022-07-30 ENCOUNTER — Other Ambulatory Visit: Payer: Self-pay

## 2022-07-30 DIAGNOSIS — J329 Chronic sinusitis, unspecified: Secondary | ICD-10-CM

## 2022-07-30 DIAGNOSIS — R918 Other nonspecific abnormal finding of lung field: Secondary | ICD-10-CM

## 2022-07-30 DIAGNOSIS — J479 Bronchiectasis, uncomplicated: Secondary | ICD-10-CM

## 2022-08-09 ENCOUNTER — Telehealth: Payer: Self-pay | Admitting: *Deleted

## 2022-08-09 NOTE — Telephone Encounter (Signed)
Patient is calling to check on status of a referral for [facility]. Looks like there was a referral sent over but was closed by the [facility]'s office. Could we send over another referral to [facility] urgent to get scheduled soon.  Also, patient would like to go over lab results that was done on 07/08/22. Please call and advise patient. [phone]

## 2022-08-13 ENCOUNTER — Other Ambulatory Visit (HOSPITAL_COMMUNITY): Payer: Medicare Other

## 2022-08-13 ENCOUNTER — Other Ambulatory Visit: Payer: Self-pay

## 2022-08-13 DIAGNOSIS — J329 Chronic sinusitis, unspecified: Secondary | ICD-10-CM

## 2022-08-13 NOTE — Progress Notes (Signed)
Second referral placed for ENT. Ok per Dr.Wert

## 2022-08-13 NOTE — Telephone Encounter (Signed)
Referral replaced, ok per Dr.Wert since [facility] closed last

## 2022-09-09 DIAGNOSIS — K080 Exfoliation of teeth due to systemic causes: Secondary | ICD-10-CM | POA: Diagnosis not present

## 2022-09-10 DIAGNOSIS — J320 Chronic maxillary sinusitis: Secondary | ICD-10-CM | POA: Diagnosis not present

## 2022-09-10 DIAGNOSIS — L299 Pruritus, unspecified: Secondary | ICD-10-CM | POA: Insufficient documentation

## 2022-09-10 DIAGNOSIS — J300 Vasomotor rhinitis: Secondary | ICD-10-CM | POA: Diagnosis not present

## 2022-09-10 DIAGNOSIS — J479 Bronchiectasis, uncomplicated: Secondary | ICD-10-CM | POA: Diagnosis not present

## 2022-09-12 DIAGNOSIS — E7800 Pure hypercholesterolemia, unspecified: Secondary | ICD-10-CM | POA: Diagnosis not present

## 2022-09-12 DIAGNOSIS — I10 Essential (primary) hypertension: Secondary | ICD-10-CM | POA: Diagnosis not present

## 2022-09-12 DIAGNOSIS — N529 Male erectile dysfunction, unspecified: Secondary | ICD-10-CM | POA: Diagnosis not present

## 2022-09-12 DIAGNOSIS — R7303 Prediabetes: Secondary | ICD-10-CM | POA: Diagnosis not present

## 2022-09-12 DIAGNOSIS — Z0000 Encounter for general adult medical examination without abnormal findings: Secondary | ICD-10-CM | POA: Diagnosis not present

## 2022-09-24 ENCOUNTER — Telehealth: Payer: Self-pay | Admitting: Internal Medicine

## 2022-09-24 NOTE — Telephone Encounter (Signed)
Pt called the office stating that he saw Dr. [name] with ENT. Pt and their office are wanting Dr. [name] to review this as pt said that ENT is requesting for pt to have some surgeries performed.  Dr. [name], this visit can be seen in care everywhere.  Routing to Dr. [name] for review.

## 2022-09-24 NOTE — Telephone Encounter (Signed)
Reviewed note and agree with the plan - given he has bronchiectasis we need to be sure we keep his sinuses as clear as possible as effects of gravity drainage will lead to more lung infections going forward if sinuses don't stay clear.

## 2022-09-25 NOTE — Telephone Encounter (Signed)
Spoke with patient. Advised Dr. [name] reviewed note from Dr. [name] office and agrees with his plan for the patient. Patient verbalized understanding. NFN

## 2022-09-26 DIAGNOSIS — H25011 Cortical age-related cataract, right eye: Secondary | ICD-10-CM | POA: Diagnosis not present

## 2022-09-26 DIAGNOSIS — H2511 Age-related nuclear cataract, right eye: Secondary | ICD-10-CM | POA: Diagnosis not present

## 2022-09-26 DIAGNOSIS — Z961 Presence of intraocular lens: Secondary | ICD-10-CM | POA: Diagnosis not present

## 2022-10-07 DIAGNOSIS — K080 Exfoliation of teeth due to systemic causes: Secondary | ICD-10-CM | POA: Diagnosis not present

## 2022-10-10 ENCOUNTER — Telehealth: Payer: Self-pay | Admitting: Internal Medicine

## 2022-10-10 NOTE — Telephone Encounter (Signed)
ATC X1 LVM for Carlos Choi requesting surgical clearance request for patient. Leaving encounter open

## 2022-10-10 NOTE — Telephone Encounter (Signed)
Albin Felling from [facility] has called in to get surgical clearance for May 1st

## 2022-10-11 NOTE — Telephone Encounter (Signed)
Fax received from Dr. Serena Colonel with [facility] to perform a Rt Endoscopic Maxillary Antrostomy w/removal of tissue on patient.  Patient needs surgery clearance. Surgery is 10/16/2022. Patient was seen on 07/08/2022. Office protocol is a risk assessment can be sent to surgeon if patient has been seen in 60 days or less.   Sending to [employer] for risk assessment or recommendations if patient needs to be seen in office prior to surgical procedure.    Katie patient is scheduled for OV on Monday 04/29  Please advise how many days prior to surgery can patient come off medication Aspirin and when will patient be able to resume?

## 2022-10-12 NOTE — H&P (Signed)
HPI:  Carlos Choi is a 77 y.o. male who presents as a consult Patient.  Referring Provider: Chaney Malling, MD  Chief complaint: Sinus problem.  HPI: History of bronchiectasis, followed by Dr. [name]. He underwent CT of the sinus recently which revealed a surprising finding of chronic maxillary disease. He does not really have any nasal symptoms other than his nose drips when he eats. He denies nasal obstruction or any foul discharge. He denies any pain or pressure. He does have chronic itching of his ears.  PMH/Meds/All/SocHx/FamHx/ROS:  Past Medical History: Diagnosis Date Bronchiectasis (CMS/HCC)  History reviewed. No pertinent surgical history.  No family history of bleeding disorders, wound healing problems or difficulty with anesthesia.    Current Outpatient Medications: ALPRAZolam (XANAX) 0.25 mg tablet, Take 0.25 mg by mouth daily as needed., Disp: , Rfl: aspirin 81 mg EC tablet, Take 81 mg by mouth., Disp: , Rfl: atorvastatin (LIPITOR) 10 mg tablet, Take 10 mg by mouth daily., Disp: , Rfl: folic acid/multivit-min/lutein (CENTRUM SILVER ORAL), , Disp: , Rfl: hyoscyamine (LEVSIN) 0.125 mg tablet, Take 0.125 mg by mouth., Disp: , Rfl: levothyroxine (SYNTHROID) 100 mcg tablet, Take 100 mcg by mouth daily., Disp: , Rfl: loratadine (CLARITIN) 10 mg tablet, Take 10 mg by mouth daily., Disp: , Rfl: omega 3-dha-epa-fish oil (Fish OiL) 1,000 mg cap capsule, , Disp: , Rfl: silodosin (RAPAFLO) 8 mg cap capsule, Take 8 mg by mouth daily., Disp: , Rfl:  A complete ROS was performed with pertinent positives/negatives noted in the HPI. The remainder of the ROS are negative.   Physical Exam:  Pulse 50  Temp 97.9 F (36.6 C) (Temporal)  Resp 18  Ht 1.93 m (6\' 4" )  Wt 88.9 kg (196 lb)  SpO2 97%  BMI 23.86 kg/m  General: Healthy and alert, in no distress, breathing easily. Normal affect. In a pleasant mood. Head: Normocephalic, atraumatic. No masses, or scars. Eyes:  Pupils are equal, and reactive to light. Vision is grossly intact. No spontaneous or gaze nystagmus. Ears: Ear canals are clear. Tympanic membranes are intact, with normal landmarks and the middle ears are clear and healthy. Hearing: Grossly normal. Nose: Nasal cavities are clear with healthy mucosa, no polyps or exudate. Airways are patent. Face: No masses or scars, facial nerve function is symmetric. Oral Cavity: No mucosal abnormalities are noted. Tongue with normal mobility. Dentition appears healthy. Oropharynx: Tonsils are symmetric. There are no mucosal masses identified. Tongue base appears normal and healthy. Larynx/Hypopharynx: deferred Chest: Deferred Neck: No palpable masses, no cervical adenopathy, no thyroid nodules or enlargement. Neuro: Cranial nerves II-XII with normal function. Balance: Normal gate. Other findings: none.  Independent Review of Additional Tests or Records: CT maxillofacial from 07/26/2022:  IMPRESSION: Complete opacification of the right maxillary sinus with internal hyperdensity, suggestive of inspissated secretions and/or fungal colonization. No evidence of bony destruction.  Procedures: none  Impression & Plans: Chronic itching of the ears. Recommend Elocon lotion steroid drops.  Chronic rhinitis and rhinorrhea when he eats. Recommend a trial of ipratropium spray.  Chronic maxillary sinusitis consistent with fungal sinusitis. Recommend maxillary antrostomy with removal of fungal debris. He will discuss this with his wife and will contact us when he is ready.

## 2022-10-14 ENCOUNTER — Ambulatory Visit (INDEPENDENT_AMBULATORY_CARE_PROVIDER_SITE_OTHER): Payer: Medicare Other

## 2022-10-14 ENCOUNTER — Encounter (HOSPITAL_COMMUNITY): Payer: Self-pay | Admitting: Otolaryngology

## 2022-10-14 ENCOUNTER — Ambulatory Visit: Payer: Medicare Other | Admitting: Nurse Practitioner

## 2022-10-14 ENCOUNTER — Encounter: Payer: Self-pay | Admitting: Nurse Practitioner

## 2022-10-14 VITALS — BP 124/68 | HR 50 | Temp 97.6°F | Ht 76.0 in | Wt 188.8 lb

## 2022-10-14 DIAGNOSIS — J479 Bronchiectasis, uncomplicated: Secondary | ICD-10-CM

## 2022-10-14 DIAGNOSIS — Z01818 Encounter for other preprocedural examination: Secondary | ICD-10-CM | POA: Diagnosis not present

## 2022-10-14 DIAGNOSIS — Z01811 Encounter for preprocedural respiratory examination: Secondary | ICD-10-CM

## 2022-10-14 DIAGNOSIS — R911 Solitary pulmonary nodule: Secondary | ICD-10-CM | POA: Diagnosis not present

## 2022-10-14 NOTE — Assessment & Plan Note (Signed)
Moderate risk. Factors that increase the risk for postoperative pulmonary complications are bronchiectasis, age.   Respiratory complications generally occur in 1% of ASA Class I patients, 5% of ASA Class II and 10% of ASA Class III-IV patients These complications rarely result in mortality and include postoperative pneumonia, atelectasis, pulmonary embolism, ARDS and increased time requiring postoperative mechanical ventilation.   Overall, I recommend proceeding with the surgery if the risk for respiratory complications are outweighed by the potential benefits. This will need to be discussed between the patient and surgeon.   To reduce risks of respiratory complications, I recommend: --Pre- and post-operative incentive spirometry performed frequently while awake --Short duration of surgery as much as possible and avoid paralytic if possible --OOB, encourage mobility post-op, pulmonary toileting    1) RISK FOR PROLONGED MECHANICAL VENTILAION - > 48h  1A) [place] - Prolonged mech ventilation risk [place] Postperative Pulmonary Risk Score - for mech ventilation dependence >48h USAA, Ann Surg 2000, major non-cardiac surgery) Comment Score  Type of surgery - abd ao aneurysm (27), thoracic (21), neurosurgery / upper abdominal / vascular (21), neck (11) Sinus surgery 5  Emergency Surgery - (11)  0  ALbumin < 3 or poor nutritional state - (9)  0  BUN > 30 -  (8)  0  Partial or completely dependent functional status - (7)  0  COPD -  (6) Btx; mild  4  Age - 60 to 88 (4), > 70  (6)  6  TOTAL  15  Risk Stratifcation scores  - < 10 (0.5%), 11-19 (1.8%), 20-27 (4.2%), 28-40 (10.1%), >40 (26.6%)  1.8%

## 2022-10-14 NOTE — Progress Notes (Signed)
@Patient  ID: Carlos Choi, male    DOB: February 10, 1946, 77 y.o.   MRN: [id number]  Chief Complaint  Patient presents with   Follow-up    Surgical clearance     Referring provider: [name], MD  HPI: 77 year old male, former smoker followed for bronchiectasis. He is a patient of Dr. [name] and last seen in office 07/08/2022. Past medical history significant for chronic sinusitis, anxiety, hypothyroid.   TEST/EVENTS:  11/14/2020 PFT: FVC 94, FEV1 94, ratio 73, TLC 107, DLCOunc 88 08/01/2021 HRCT chest: heart borderline enlarged. Atherosclerosis. No LAD. 8x7 mm thick walled cavitary nodule in RUL, stable. Few scattered areas of mild BTX, peribronchovascular thickening, associated micro and macronodularity, consistent with chronic indolent atypical infection. Air trapping.  07/24/2022 CT chest wo con: mild cardiomegaly. Atherosclerosis. 8 mm mildly thick-walled cavitary lesion in lateral right lung apex, unchanged. 7 mm solid nodule in the lateral LUL, unchanged. Stability demonstrated to at least August 2021. Scattered areas of subpleural scarring/fibrosis, with assocaited btx. 07/24/2022 maxillofacial: complete opacification of the right maxillary sinus with internal hyperdensity, suggestive of inspissated secretions and/or fungal colonization   07/08/2022: OV with Dr. [name]. Doing well. Uses mucinex and flutter valve as needed. Only gets SOB with uphill walking. Minimal AM productive cough with white phlegm and easy to clear. CT with BTX. Labs nl. CT sinus ordered.   10/14/2022: Today - surgical clearance Patient presents today for surgical clearance prior to right endoscopic maxillary antrostomy with Dr. [name], scheduled for 10/16/2022. He tells me he has been doing well since he was last seen. Feels relatively unchanged. He has a productive cough in the morning with white phlegm, which is baseline for him. He doesn't feel like he has any shortness of breath or chest congestion. He lives an  active lifestyle and walks over a mile every day with his wife. No recent fevers, chills, hemoptysis, night sweats, weight loss, anorexia.   No Known Allergies  Immunization History  Administered Date(s) Administered   COVID-19, mRNA, vaccine(Comirnaty)12 years and older 04/16/2022   Fluad Quad(high Dose 65+) 03/16/2019   Influenza, High Dose Seasonal PF 04/02/2017, 03/13/2020, 04/09/2021   Influenza,inj,Quad PF,6+ Mos 04/06/2013, 04/06/2014, 04/07/2015, 03/29/2016, 03/10/2018   Influenza-Unspecified 03/17/2012, 04/04/2022   PFIZER(Purple Top)SARS-COV-2 Vaccination 07/07/2019, 07/28/2019, 03/31/2020, 12/11/2020   Pfizer Covid-19 Vaccine Bivalent Booster 45yrs & up 04/09/2021   Pneumococcal Conjugate-13 04/02/2017   Pneumococcal Polysaccharide-23 01/23/2016   Tdap 06/17/2009   Zoster Recombinat (Shingrix) 04/02/2017, 06/02/2017    Past Medical History:  Diagnosis Date   Allergy    Anxiety    Arthritis    mild ankles    Cataract    removed left eye, forming right eye    Diverticulosis    Hemorrhoids    Hypothyroid    Kidney stones    Rectal bleeding    due to hemorrhoid    Tubular adenoma of colon 2015    Tobacco History: Social History   Tobacco Use  Smoking Status Former   Packs/day: 0.50   Years: 6.00   Additional pack years: 0.00   Total pack years: 3.00   Types: Cigarettes   Quit date: 06/18/1979   Years since quitting: 43.3  Smokeless Tobacco Never   Counseling given: Not Answered   Outpatient Medications Prior to Visit  Medication Sig Dispense Refill   ALPRAZolam (XANAX) 0.25 MG tablet Take 1 tablet (0.25 mg total) by mouth daily as needed for anxiety. 90 tablet 0   aspirin EC 81 MG  tablet Take 81 mg by mouth every other day. Swallow whole.     atorvastatin (LIPITOR) 10 MG tablet Take 1 tablet (10 mg total) by mouth daily. 90 tablet 3   hyoscyamine (LEVSIN) 0.125 MG tablet Take 1 tablet (0.125 mg total) by mouth every 6 (six) hours as needed for cramping  (diarrhea). 30 tablet 0   Lactobacillus-Inulin (CULTURELLE DIGESTIVE HEALTH PO) Take 1 capsule by mouth daily.     levothyroxine (SYNTHROID) 100 MCG tablet Take 1 tablet (100 mcg total) by mouth daily. 90 tablet 3   loratadine (CLARITIN) 10 MG tablet Take 10 mg by mouth daily.     Multiple Vitamins-Minerals (CENTRUM SILVER PO) Take 1 tablet by mouth daily.     Omega-3 Fatty Acids (FISH OIL) 1000 MG CAPS Take 2,000 mg by mouth daily.     silodosin (RAPAFLO) 8 MG CAPS capsule Take 8 mg by mouth daily.     No facility-administered medications prior to visit.     Review of Systems:   Constitutional: No weight loss or gain, night sweats, fevers, chills, fatigue, or lassitude. HEENT: No headaches, difficulty swallowing, tooth/dental problems, or sore throat. No sneezing, itching, ear ache. +nasal congestion, post nasal drip CV:  No chest pain, orthopnea, PND, swelling in lower extremities, anasarca, dizziness, palpitations, syncope Resp: +shortness of breath with only uphill climbing; chronic productive AM cough. No excess mucus or change in color of mucus. No hemoptysis. No wheezing.  No chest wall deformity GI:  No heartburn, indigestion, abdominal pain, nausea, vomiting, diarrhea, change in bowel habits, loss of appetite, bloody stools.  GU: No dysuria, change in color of urine, urgency or frequency.   Skin: No rash, lesions, ulcerations MSK:  No joint pain or swelling.   Neuro: No dizziness or lightheadedness.  Psych: No depression or anxiety. Mood stable.     Physical Exam:  BP 124/68   Pulse (!) 50   Temp 97.6 F (36.4 C) (Oral)   Ht 6\' 4"  (1.93 m)   Wt 188 lb 12.8 oz (85.6 kg)   SpO2 98%   BMI 22.98 kg/m   GEN: Pleasant, interactive, well-appearing; in no acute distress. HEENT:  Normocephalic and atraumatic. PERRLA. Sclera white. Nasal turbinates pink, moist and patent bilaterally. No rhinorrhea present. Oropharynx pink and moist, without exudate or edema. No lesions,  ulcerations, or postnasal drip.  NECK:  Supple w/ fair ROM. No JVD present. Normal carotid impulses w/o bruits. Thyroid symmetrical with no goiter or nodules palpated. No lymphadenopathy.   CV: RRR, no m/r/g, no peripheral edema. Pulses intact, +2 bilaterally. No cyanosis, pallor or clubbing. PULMONARY:  Unlabored, regular breathing. Clear bilaterally A&P w/o wheezes/rales/rhonchi. No accessory muscle use.  GI: BS present and normoactive. Soft, non-tender to palpation. No organomegaly or masses detected. MSK: No erythema, warmth or tenderness. Cap refil <2 sec all extrem. No deformities or joint swelling noted.  Neuro: A/Ox3. No focal deficits noted.   Skin: Warm, no lesions or rashe Psych: Normal affect and behavior. Judgement and thought content appropriate.     Lab Results:  CBC    Component Value Date/Time   WBC 5.3 12/03/2021 1000   RBC 5.37 12/03/2021 1000   HGB 17.4 (H) 12/03/2021 1000   HCT 50.3 (H) 12/03/2021 1000   PLT 133 (L) 12/03/2021 1000   MCV 93.7 12/03/2021 1000   MCV 98.8 (A) 11/02/2015 1336   MCH 32.4 12/03/2021 1000   MCHC 34.6 12/03/2021 1000   RDW 13.1 12/03/2021 1000   LYMPHSABS 1,511  12/03/2021 1000   MONOABS 459 11/06/2016 0910   EOSABS 111 12/03/2021 1000   BASOSABS 58 12/03/2021 1000    BMET    Component Value Date/Time   NA 143 12/03/2021 1000   K 4.7 12/03/2021 1000   CL 107 12/03/2021 1000   CO2 26 12/03/2021 1000   GLUCOSE 94 12/03/2021 1000   BUN 19 12/03/2021 1000   CREATININE 0.96 12/03/2021 1000   CALCIUM 9.5 12/03/2021 1000   GFRNONAA 83 12/04/2020 1049   GFRAA 97 12/04/2020 1049    BNP No results found for: "BNP"   Imaging:  No results found.       Latest Ref Rng & Units 11/14/2020   10:46 AM  PFT Results  FVC-Pre L 5.07   FVC-Predicted Pre % 94   FVC-Post L 4.78   FVC-Predicted Post % 89   Pre FEV1/FVC % % 74   Post FEV1/FCV % % 73   FEV1-Pre L 3.73   FEV1-Predicted Pre % 94   FEV1-Post L 3.50   DLCO  uncorrected ml/min/mmHg 26.76   DLCO UNC% % 88   DLVA Predicted % 92   TLC L 8.87   TLC % Predicted % 107   RV % Predicted % 132     No results found for: "NITRICOXIDE"      Assessment & Plan:   Bronchiectasis without complication (HCC) Compensated on current regimen. No recent flares requiring abx or hospitalizations. Continue mucociliary clearance as needed. Understands red flag symptoms to notify of.   Patient Instructions  Continue mucinex Twice daily as needed for cough/congestion Continue flutter valve as needed for cough/congestion  Continue claritin 1 tab daily for allergies   Out of bed as soon as possible after surgery to get up and moving again. Use incentive spirometer 10 times an hour while awake during the recovery period.   Chest x ray today  Follow up as scheduled with Dr. [name] in January 2025. If symptoms worsen, please contact office for sooner follow up or seek emergency care.    Preoperative respiratory examination Moderate risk. Factors that increase the risk for postoperative pulmonary complications are bronchiectasis, age.   Respiratory complications generally occur in 1% of ASA Class I patients, 5% of ASA Class II and 10% of ASA Class III-IV patients These complications rarely result in mortality and include postoperative pneumonia, atelectasis, pulmonary embolism, ARDS and increased time requiring postoperative mechanical ventilation.   Overall, I recommend proceeding with the surgery if the risk for respiratory complications are outweighed by the potential benefits. This will need to be discussed between the patient and surgeon.   To reduce risks of respiratory complications, I recommend: --Pre- and post-operative incentive spirometry performed frequently while awake --Short duration of surgery as much as possible and avoid paralytic if possible --OOB, encourage mobility post-op, pulmonary toileting    1) RISK FOR PROLONGED MECHANICAL VENTILAION -  > 48h  1A) Arozullah - Prolonged mech ventilation risk Arozullah Postperative Pulmonary Risk Score - for mech ventilation dependence >48h USAA, Ann Surg 2000, major non-cardiac surgery) Comment Score  Type of surgery - abd ao aneurysm (27), thoracic (21), neurosurgery / upper abdominal / vascular (21), neck (11) Sinus surgery 5  Emergency Surgery - (11)  0  ALbumin < 3 or poor nutritional state - (9)  0  BUN > 30 -  (8)  0  Partial or completely dependent functional status - (7)  0  COPD -  (6) Btx; mild  4  Age -  60 to 69 (4), > 70  (6)  6  TOTAL  15  Risk Stratifcation scores  - < 10 (0.5%), 11-19 (1.8%), 20-27 (4.2%), 28-40 (10.1%), >40 (26.6%)  1.8%     I spent 32 minutes of dedicated to the care of this patient on the date of this encounter to include pre-visit review of records, face-to-face time with the patient discussing conditions above, post visit ordering of testing, clinical documentation with the electronic health record, making appropriate referrals as documented, and communicating necessary findings to members of the patients care team.  Noemi Chapel, NP 10/14/2022  Pt aware and understands NP's role.

## 2022-10-14 NOTE — Assessment & Plan Note (Signed)
Compensated on current regimen. No recent flares requiring abx or hospitalizations. Continue mucociliary clearance as needed. Understands red flag symptoms to notify of.   Patient Instructions  Continue mucinex Twice daily as needed for cough/congestion Continue flutter valve as needed for cough/congestion  Continue claritin 1 tab daily for allergies   Out of bed as soon as possible after surgery to get up and moving again. Use incentive spirometer 10 times an hour while awake during the recovery period.   Chest x ray today  Follow up as scheduled with Dr. [name] in January 2025. If symptoms worsen, please contact office for sooner follow up or seek emergency care.

## 2022-10-14 NOTE — Patient Instructions (Addendum)
Continue mucinex Twice daily as needed for cough/congestion Continue flutter valve as needed for cough/congestion  Continue claritin 1 tab daily for allergies   Out of bed as soon as possible after surgery to get up and moving again. Use incentive spirometer 10 times an hour while awake during the recovery period.   Chest x ray today  Follow up as scheduled with Dr. [name] in January 2025. If symptoms worsen, please contact office for sooner follow up or seek emergency care.

## 2022-10-15 NOTE — Telephone Encounter (Signed)
OV notes and clearance form have been faxed back to [facility]. Nothing further needed at this time.  ?

## 2022-10-16 ENCOUNTER — Other Ambulatory Visit: Payer: Self-pay

## 2022-10-16 ENCOUNTER — Encounter (HOSPITAL_COMMUNITY)
Admission: RE | Disposition: A | Discharge status: Home / Self Care | Payer: Self-pay | Source: Ambulatory Visit | Attending: Otolaryngology

## 2022-10-16 ENCOUNTER — Ambulatory Visit (HOSPITAL_COMMUNITY)
Admission: RE | Admit: 2022-10-16 | Discharge: 2022-10-16 | Disposition: A | Discharge status: Home / Self Care | Payer: Medicare Other | Source: Ambulatory Visit | Attending: Otolaryngology | Admitting: Otolaryngology

## 2022-10-16 ENCOUNTER — Ambulatory Visit (HOSPITAL_COMMUNITY): Payer: Medicare Other | Admitting: Anesthesiology

## 2022-10-16 ENCOUNTER — Encounter (HOSPITAL_COMMUNITY): Payer: Self-pay | Admitting: Otolaryngology

## 2022-10-16 DIAGNOSIS — L299 Pruritus, unspecified: Secondary | ICD-10-CM | POA: Insufficient documentation

## 2022-10-16 DIAGNOSIS — J320 Chronic maxillary sinusitis: Secondary | ICD-10-CM | POA: Diagnosis not present

## 2022-10-16 DIAGNOSIS — Z538 Procedure and treatment not carried out for other reasons: Secondary | ICD-10-CM | POA: Diagnosis not present

## 2022-10-16 DIAGNOSIS — J310 Chronic rhinitis: Secondary | ICD-10-CM | POA: Diagnosis not present

## 2022-10-16 LAB — CBC
HCT: 49.4 % (ref 39.0–52.0)
Hemoglobin: 16.9 g/dL (ref 13.0–17.0)
MCH: 31.6 pg (ref 26.0–34.0)
MCHC: 34.2 g/dL (ref 30.0–36.0)
MCV: 92.3 fL (ref 80.0–100.0)
Platelets: 140 10*3/uL — ABNORMAL LOW (ref 150–400)
RBC: 5.35 MIL/uL (ref 4.22–5.81)
RDW: 14.4 % (ref 11.5–15.5)
WBC: 5.8 10*3/uL (ref 4.0–10.5)
nRBC: 0 % (ref 0.0–0.2)

## 2022-10-16 SURGERY — SINUS SURGERY, ENDOSCOPIC
Anesthesia: General | Site: Mouth | Laterality: Right

## 2022-10-16 MED ORDER — CHLORHEXIDINE GLUCONATE 0.12 % MT SOLN
15.0000 mL | Freq: Once | OROMUCOSAL | Status: AC
  Administered 2022-10-16: 15 mL via OROMUCOSAL
  Filled 2022-10-16: qty 15

## 2022-10-16 MED ORDER — LACTATED RINGERS IV SOLN: INTRAVENOUS | Status: DC

## 2022-10-16 MED ORDER — ACETAMINOPHEN 500 MG PO TABS
1000.0000 mg | ORAL_TABLET | Freq: Once | ORAL | Status: AC
  Administered 2022-10-16: 1000 mg via ORAL
  Filled 2022-10-16: qty 2

## 2022-10-16 MED ORDER — OXYMETAZOLINE HCL 0.05 % NA SOLN
2.0000 | NASAL | Status: DC
  Filled 2022-10-16: qty 30

## 2022-10-16 MED ORDER — ORAL CARE MOUTH RINSE: 15.0000 mL | Freq: Once | OROMUCOSAL | Status: AC

## 2022-10-16 NOTE — Anesthesia Preprocedure Evaluation (Addendum)
Anesthesia Evaluation    Reviewed: Allergy & Precautions, Patient's Chart, lab work & pertinent test results  Airway        Dental   Pulmonary former smoker          Cardiovascular negative cardio ROS      Neuro/Psych  PSYCHIATRIC DISORDERS Anxiety     negative neurological ROS     GI/Hepatic negative GI ROS, Neg liver ROS,,,  Endo/Other  Hypothyroidism    Renal/GU negative Renal ROS  negative genitourinary   Musculoskeletal  (+) Arthritis , Osteoarthritis,    Abdominal   Peds  Hematology negative hematology ROS (+)   Anesthesia Other Findings   Reproductive/Obstetrics negative OB ROS                             Anesthesia Physical Anesthesia Plan  ASA: 2  Anesthesia Plan: General   Post-op Pain Management:    Induction: Intravenous  PONV Risk Score and Plan: Treatment may vary due to age or medical condition, Ondansetron and Dexamethasone  Airway Management Planned: Oral ETT  Additional Equipment: None  Intra-op Plan:   Post-operative Plan: Extubation in OR  Informed Consent:   Plan Discussed with:   Anesthesia Plan Comments: (Fund to be in Aflutter with variable rate anywhere from 35-70bpm, completely asymptomatic. Denies any recent chest, SOB or lightheadedness. Does not have cardiologist, has never had cardiac w/u for any reason. Good exercise tolerance.  D/w pt that elective surgery in the setting of new Aflutter will need to rescheduled- I have set the patient up for appt at [facility] in 6 days and I feel he is safe for D/C to home as he has been completely asymptomatic. All questions answered. )       Anesthesia Quick Evaluation

## 2022-10-16 NOTE — Progress Notes (Signed)
Upon arrival to Short Stay patient's heart rate showed 38 on the monitor. Recheck= ranging from 40-52. Dr. Salvadore Farber made aware and verbal order received to get an EKG. EKG done and reviewed by Dr. Salvadore Farber. Patient denies any chest pain, shortness of breath, and dizziness. Patient states he walks 1.5 miles daily with his wife and has never had any issues. No further orders received at this time. Dr. Salvadore Farber stated she will come to Short Stay to assess the patient.

## 2022-10-16 NOTE — Progress Notes (Signed)
Per Dr. [name], he is not coming to Short Stay to see the patient and his office will contact the patient once he is seen by cardiology. Patient made aware and escorted to the lobby by staff. Patient denies any complaints.

## 2022-10-16 NOTE — Progress Notes (Signed)
Per Dr. Salvadore Farber today's surgery is canceled. Dr. Salvadore Farber came to Short Stay to discuss this with the patient. PIV removed. Patient and his wife are going to wait for Dr. [name] to come see them in Short Stay because they have a few questions for Dr. [name]. Dr. [name] is aware.

## 2022-10-22 ENCOUNTER — Ambulatory Visit (HOSPITAL_COMMUNITY): Payer: Medicare Other | Admitting: Internal Medicine

## 2022-10-22 ENCOUNTER — Ambulatory Visit (HOSPITAL_COMMUNITY)
Admission: RE | Admit: 2022-10-22 | Discharge: 2022-10-22 | Disposition: A | Discharge status: Home / Self Care | Payer: Medicare Other | Source: Ambulatory Visit | Attending: Internal Medicine | Admitting: Internal Medicine

## 2022-10-22 ENCOUNTER — Encounter (HOSPITAL_COMMUNITY): Payer: Self-pay

## 2022-10-22 VITALS — BP 164/80 | HR 57 | Ht 76.0 in | Wt 189.0 lb

## 2022-10-22 DIAGNOSIS — E039 Hypothyroidism, unspecified: Secondary | ICD-10-CM | POA: Diagnosis not present

## 2022-10-22 DIAGNOSIS — Z7982 Long term (current) use of aspirin: Secondary | ICD-10-CM | POA: Diagnosis not present

## 2022-10-22 DIAGNOSIS — I480 Paroxysmal atrial fibrillation: Secondary | ICD-10-CM

## 2022-10-22 DIAGNOSIS — I4891 Unspecified atrial fibrillation: Secondary | ICD-10-CM | POA: Insufficient documentation

## 2022-10-22 DIAGNOSIS — Z7901 Long term (current) use of anticoagulants: Secondary | ICD-10-CM | POA: Insufficient documentation

## 2022-10-22 DIAGNOSIS — Z87891 Personal history of nicotine dependence: Secondary | ICD-10-CM | POA: Diagnosis not present

## 2022-10-22 DIAGNOSIS — I4892 Unspecified atrial flutter: Secondary | ICD-10-CM | POA: Diagnosis not present

## 2022-10-22 DIAGNOSIS — D6869 Other thrombophilia: Secondary | ICD-10-CM | POA: Diagnosis not present

## 2022-10-22 MED ORDER — APIXABAN 5 MG PO TABS: 5.0000 mg | ORAL_TABLET | Freq: Two times a day (BID) | ORAL | 3 refills | Status: DC

## 2022-10-22 NOTE — Progress Notes (Signed)
Primary Care Physician: [name], MD Primary Cardiologist: None Primary Electrophysiologist: None Referring Physician:    [name] is a 77 y.o. male with a history of bronchiectasis, history of tobacco use, chronic sinusitis, hypothyroidism, and atrial fibrillation who presents for consultation in the [facility].  The patient was initially diagnosed with atrial fibrillation on day of procedure for maxillary antrostomy. He does not have cardiac awareness of arrhythmia. Patient is on aspirin 81 mg every other day. He has a CHADS2VASC score of 2.  On evaluation today, he is in atrial flutter. He cannot feel when he is in arrhythmia and was surprised when he was told on day of surgical procedure. It was canceled. He feels tired but states this symptom has been going on for months, since last year.    Today, he denies symptoms of palpitations, chest pain, shortness of breath, orthopnea, PND, lower extremity edema, dizziness, presyncope, syncope, snoring, daytime somnolence, bleeding, or neurologic sequela. The patient is tolerating medications without difficulties and is otherwise without complaint today.   Atrial Fibrillation Risk Factors:  he does not have symptoms or diagnosis of sleep apnea. he does not have a history of rheumatic fever. he does not have a history of alcohol use. The patient does not have a history of early familial atrial fibrillation or other arrhythmias.  he has a BMI of Body mass index is 23.01 kg/m.[name] Filed Weights   10/22/22 1449  Weight: 85.7 kg    Family History  Problem Relation Age of Onset   Aneurysm Mother    Alzheimer's disease Father    Cancer Sister        ovarian   Stroke Brother    Hypertension Brother    Colon cancer Neg Hx    Esophageal cancer Neg Hx    Stomach cancer Neg Hx    Rectal cancer Neg Hx    Colon polyps Neg Hx      Atrial Fibrillation Management history:  Previous antiarrhythmic  drugs: None Previous cardioversions: None Previous ablations: None Anticoagulation history: None   Past Medical History:  Diagnosis Date   Allergy    Anxiety    Arthritis    mild ankles    Cataract    removed left eye, forming right eye    Diverticulosis    Hemorrhoids    Hypothyroid    Kidney stones    Rectal bleeding    due to hemorrhoid    Tubular adenoma of colon 2015   Past Surgical History:  Procedure Laterality Date   CATARACT EXTRACTION Left    COLONOSCOPY     HERNIA REPAIR Bilateral    Over ten years ago per patient. He was not sure of the date.   POLYPECTOMY     ROTATOR CUFF REPAIR     both shoulders - patient does not remember the exact date    Current Outpatient Medications  Medication Sig Dispense Refill   ALPRAZolam (XANAX) 0.25 MG tablet Take 1 tablet (0.25 mg total) by mouth daily as needed for anxiety. 90 tablet 0   apixaban (ELIQUIS) 5 MG TABS tablet Take 1 tablet (5 mg total) by mouth 2 (two) times daily. 60 tablet 3   atorvastatin (LIPITOR) 10 MG tablet Take 1 tablet (10 mg total) by mouth daily. 90 tablet 3   hyoscyamine (LEVSIN) 0.125 MG tablet Take 1 tablet (0.125 mg total) by mouth every 6 (six) hours as needed for cramping (diarrhea). 30 tablet 0  ipratropium (ATROVENT) 0.03 % nasal spray Place 2 sprays into both nostrils as needed for rhinitis.     Lactobacillus-Inulin (CULTURELLE DIGESTIVE HEALTH PO) Take 1 capsule by mouth daily.     levothyroxine (SYNTHROID) 100 MCG tablet Take 1 tablet (100 mcg total) by mouth daily. 90 tablet 3   loratadine (CLARITIN) 10 MG tablet Take 10 mg by mouth daily.     MOMETASONE FURO & DIMETHICONE EX Place 2 drops in ear(s) daily.     Multiple Vitamins-Minerals (CENTRUM SILVER PO) Take 1 tablet by mouth daily.     Omega-3 Fatty Acids (FISH OIL) 1000 MG CAPS Take 2,000 mg by mouth daily.     PSYLLIUM PO Take 1 packet by mouth daily. Taking 1 tsp daily in water     silodosin (RAPAFLO) 8 MG CAPS capsule Take 8 mg  by mouth daily.     No current facility-administered medications for this encounter.    No Known Allergies  Social History   Socioeconomic History   Marital status: Married    Spouse name: [name].   Number of children: 2   Years of education: Not on file   Highest education level: Not on file  Occupational History   Not on file  Tobacco Use   Smoking status: Former    Packs/day: 0.50    Years: 6.00    Additional pack years: 0.00    Total pack years: 3.00    Types: Cigarettes    Quit date: 06/18/1979    Years since quitting: 43.3   Smokeless tobacco: Never  Vaping Use   Vaping Use: Never used  Substance and Sexual Activity   Alcohol use: Yes    Comment: 2 beers Monthly.   Drug use: No   Sexual activity: Not on file  Other Topics Concern   Not on file  Social History Narrative   Not on file   Social Determinants of Health   Financial Resource Strain: Low Risk  (10/27/2020)   Overall Financial Resource Strain (CARDIA)    Difficulty of Paying Living Expenses: Not hard at all  Food Insecurity: No Food Insecurity (10/27/2020)   Hunger Vital Sign    Worried About Running Out of Food in the Last Year: Never true    Ran Out of Food in the Last Year: Never true  Transportation Needs: No Transportation Needs (10/27/2020)   PRAPARE - Administrator, Civil Service (Medical): No    Lack of Transportation (Non-Medical): No  Physical Activity: Sufficiently Active (10/27/2020)   Exercise Vital Sign    Days of Exercise per Week: 7 days    Minutes of Exercise per Session: 30 min  Stress: No Stress Concern Present (10/27/2020)   [employer] of Occupational Health - Occupational Stress Questionnaire    Feeling of Stress : Not at all  Social Connections: Moderately Integrated (10/27/2020)   Social Connection and Isolation Panel [NHANES]    Frequency of Communication with Friends and Family: More than three times a week    Frequency of Social Gatherings with Friends  and Family: Three times a week    Attends Religious Services: More than 4 times per year    Active Member of Clubs or Organizations: No    Attends Banker Meetings: Never    Marital Status: Married  Catering manager Violence: Not At Risk (10/27/2020)   Humiliation, Afraid, Rape, and Kick questionnaire    Fear of Current or Ex-Partner: No    Emotionally Abused: No  Physically Abused: No    Sexually Abused: No     ROS- All systems are reviewed and negative except as per the HPI above.  Physical Exam: Vitals:   10/22/22 1449  BP: (!) 164/80  Pulse: (!) 57  Weight: 85.7 kg  Height: 6\' 4"  (1.93 m)    GEN- The patient is a well appearing male, alert and oriented x 3 today.   Head- normocephalic, atraumatic Eyes-  Sclera clear, conjunctiva pink Ears- hearing intact Oropharynx- clear Neck- supple  Lungs- Clear to ausculation bilaterally, normal work of breathing Heart- Bradycardic irregular rate and rhythm, no murmurs, rubs or gallops  GI- soft, NT, ND, + BS Extremities- no clubbing, cyanosis, or edema MS- no significant deformity or atrophy Skin- no rash or lesion Psych- euthymic mood, full affect Neuro- strength and sensation are intact  Wt Readings from Last 3 Encounters:  10/22/22 85.7 kg  10/16/22 85.3 kg  10/14/22 85.6 kg    EKG today demonstrates  Vent. rate 57 BPM PR interval * ms QRS duration 94 ms QT/QTcB 460/447 ms P-R-T axes 259 61 -65 Atrial flutter with variable A-V block with premature ventricular or aberrantly conducted complexes Minimal voltage criteria for LVH, may be normal variant ( Sokolow-Lyon ) ST & T wave abnormality, consider lateral ischemia Abnormal ECG No previous ECGs available  Echo: N/A  Epic records are reviewed at length today.  CHA2DS2-VASc Score = 2  [name] score is based upon: CHF History: 0 HTN History: 0 Diabetes History: 0 Stroke History: 0 Vascular Disease History: 0 Age Score: 2 Gender Score:  0       ASSESSMENT AND PLAN: Paroxysmal Atrial Flutter (ICD10:  I48.0) [name] CHA2DS2-VASc score is 2, indicating a 2.2% annual risk of stroke.    Education provided about arrhythmia with visual diagram. Discussion about cardioversion and medication treatments and ablation in detail. After discussion, we will proceed with scheduling cardioversion.  He will start taking Eliquis and first full day of anticoagulation will be 5/8. Schedule DCCV 3 weeks from 5/8.   Will obtain echo once in normal rhythm.  F/u after DCCV.   2. Secondary hypercoagulable state due to atrial flutter Stop aspirin Begin Eliquis 5 mg BID     Follow up 1-2 weeks after DCCV.    [name], PA-C [facility] [address] [place], [place] [address] [phone] 10/22/2022 4:27 PM

## 2022-10-22 NOTE — Patient Instructions (Addendum)
Start Eliquis 5mg  twice a day  Stop aspirin   Cardioversion scheduled for: Thursday, May 30th   - Arrive at the [facility] and go to admitting at [facility] not eat or drink anything after midnight the night prior to your procedure.   - Take all your morning medication (except diabetic medications) with a sip of water prior to arrival.  - You will not be able to drive home after your procedure.    - Do NOT miss any doses of your blood thinner - if you should miss a dose please notify our office immediately.   - If you feel as if you go back into normal rhythm prior to scheduled cardioversion, please notify our office immediately.   If your procedure is canceled in the cardioversion suite you will be charged a cancellation fee.

## 2022-11-07 ENCOUNTER — Ambulatory Visit (HOSPITAL_COMMUNITY)
Admission: RE | Admit: 2022-11-07 | Discharge: 2022-11-07 | Disposition: A | Discharge status: Home / Self Care | Payer: Medicare Other | Source: Ambulatory Visit | Attending: Internal Medicine | Admitting: Internal Medicine

## 2022-11-07 ENCOUNTER — Encounter (HOSPITAL_COMMUNITY): Payer: Self-pay | Admitting: Internal Medicine

## 2022-11-07 DIAGNOSIS — I493 Ventricular premature depolarization: Secondary | ICD-10-CM | POA: Insufficient documentation

## 2022-11-07 DIAGNOSIS — I4891 Unspecified atrial fibrillation: Secondary | ICD-10-CM

## 2022-11-07 DIAGNOSIS — I4892 Unspecified atrial flutter: Secondary | ICD-10-CM | POA: Insufficient documentation

## 2022-11-07 DIAGNOSIS — I4439 Other atrioventricular block: Secondary | ICD-10-CM | POA: Insufficient documentation

## 2022-11-07 LAB — CBC
HCT: 47.8 % (ref 39.0–52.0)
Hemoglobin: 16.4 g/dL (ref 13.0–17.0)
MCH: 31 pg (ref 26.0–34.0)
MCHC: 34.3 g/dL (ref 30.0–36.0)
MCV: 90.4 fL (ref 80.0–100.0)
Platelets: 131 10*3/uL — ABNORMAL LOW (ref 150–400)
RBC: 5.29 MIL/uL (ref 4.22–5.81)
RDW: 14.1 % (ref 11.5–15.5)
WBC: 5.2 10*3/uL (ref 4.0–10.5)
nRBC: 0 % (ref 0.0–0.2)

## 2022-11-07 LAB — BASIC METABOLIC PANEL
Anion gap: 10 (ref 5–15)
BUN: 13 mg/dL (ref 8–23)
CO2: 24 mmol/L (ref 22–32)
Calcium: 9.1 mg/dL (ref 8.9–10.3)
Chloride: 104 mmol/L (ref 98–111)
Creatinine, Ser: 1.06 mg/dL (ref 0.61–1.24)
GFR, Estimated: >60 mL/min (ref >60)
Glucose, Bld: 95 mg/dL (ref 70–99)
Potassium: 4.2 mmol/L (ref 3.5–5.1)
Sodium: 138 mmol/L (ref 135–145)

## 2022-11-07 NOTE — H&P (View-Only) (Signed)
Patient is still in atrial flutter today.  Labs drawn for cardioversion.  ECG today shows  Vent. rate 49 BPM PR interval * ms QRS duration 88 ms QT/QTcB 480/433 ms P-R-T axes 262 57 -86 Atrial flutter with variable A-V block with premature ventricular or aberrantly conducted complexes Minimal voltage criteria for LVH, may be normal variant ( Sokolow-Lyon ) Septal infarct , age undetermined Abnormal ECG When compared with ECG of 22-Oct-2022 15:10, PREVIOUS ECG IS PRESENT   Pt will follow up as scheduled after DCCV.  

## 2022-11-07 NOTE — Progress Notes (Signed)
Patient is still in atrial flutter today.  Labs drawn for cardioversion.  ECG today shows  Vent. rate 49 BPM PR interval * ms QRS duration 88 ms QT/QTcB 480/433 ms P-R-T axes 262 57 -86 Atrial flutter with variable A-V block with premature ventricular or aberrantly conducted complexes Minimal voltage criteria for LVH, may be normal variant ( Sokolow-Lyon ) Septal infarct , age undetermined Abnormal ECG When compared with ECG of 22-Oct-2022 15:10, PREVIOUS ECG IS PRESENT   Pt will follow up as scheduled after DCCV.

## 2022-11-13 NOTE — Pre-Procedure Instructions (Signed)
Spoke with patient regarding procedure instructions for tomorrow.  Nothing to eat or drink after midnight.  Need responsible adult to drive you home and stay with you for 24 hours.  Takes Eliquis twice a day, hasn't missed any doses.  Will take dose in the morning.

## 2022-11-14 ENCOUNTER — Ambulatory Visit (HOSPITAL_COMMUNITY): Payer: Medicare Other | Admitting: Anesthesiology

## 2022-11-14 ENCOUNTER — Encounter (HOSPITAL_COMMUNITY): Payer: Self-pay | Admitting: Cardiology

## 2022-11-14 ENCOUNTER — Other Ambulatory Visit: Payer: Self-pay

## 2022-11-14 ENCOUNTER — Encounter (HOSPITAL_COMMUNITY)
Admission: RE | Disposition: A | Discharge status: Home / Self Care | Payer: Self-pay | Source: Ambulatory Visit | Attending: Cardiology

## 2022-11-14 ENCOUNTER — Observation Stay (HOSPITAL_COMMUNITY)
Admission: RE | Admit: 2022-11-14 | Discharge: 2022-11-15 | Disposition: A | Discharge status: Home / Self Care | Payer: Medicare Other | Source: Ambulatory Visit | Attending: Cardiology | Admitting: Cardiology

## 2022-11-14 ENCOUNTER — Ambulatory Visit (HOSPITAL_BASED_OUTPATIENT_CLINIC_OR_DEPARTMENT_OTHER): Payer: Medicare Other | Admitting: Anesthesiology

## 2022-11-14 DIAGNOSIS — I4892 Unspecified atrial flutter: Secondary | ICD-10-CM | POA: Insufficient documentation

## 2022-11-14 DIAGNOSIS — I442 Atrioventricular block, complete: Secondary | ICD-10-CM | POA: Diagnosis not present

## 2022-11-14 DIAGNOSIS — Z79899 Other long term (current) drug therapy: Secondary | ICD-10-CM | POA: Diagnosis not present

## 2022-11-14 DIAGNOSIS — I420 Dilated cardiomyopathy: Secondary | ICD-10-CM | POA: Insufficient documentation

## 2022-11-14 DIAGNOSIS — I441 Atrioventricular block, second degree: Secondary | ICD-10-CM | POA: Diagnosis not present

## 2022-11-14 DIAGNOSIS — E039 Hypothyroidism, unspecified: Secondary | ICD-10-CM

## 2022-11-14 DIAGNOSIS — I4891 Unspecified atrial fibrillation: Secondary | ICD-10-CM

## 2022-11-14 DIAGNOSIS — I110 Hypertensive heart disease with heart failure: Secondary | ICD-10-CM | POA: Insufficient documentation

## 2022-11-14 DIAGNOSIS — Z7901 Long term (current) use of anticoagulants: Secondary | ICD-10-CM | POA: Diagnosis not present

## 2022-11-14 DIAGNOSIS — Z87891 Personal history of nicotine dependence: Secondary | ICD-10-CM

## 2022-11-14 DIAGNOSIS — I5020 Unspecified systolic (congestive) heart failure: Secondary | ICD-10-CM | POA: Insufficient documentation

## 2022-11-14 HISTORY — PX: CARDIOVERSION: SHX1299

## 2022-11-14 LAB — T4, FREE: Free T4: 1.73 ng/dL — ABNORMAL HIGH (ref 0.61–1.12)

## 2022-11-14 LAB — TSH: TSH: 1.323 u[IU]/mL (ref 0.350–4.500)

## 2022-11-14 SURGERY — CARDIOVERSION
Anesthesia: General

## 2022-11-14 MED ORDER — LIDOCAINE 2% (20 MG/ML) 5 ML SYRINGE
INTRAMUSCULAR | Status: DC | PRN
  Administered 2022-11-14: 100 mg via INTRAVENOUS

## 2022-11-14 MED ORDER — EPHEDRINE SULFATE-NACL 50-0.9 MG/10ML-% IV SOSY
PREFILLED_SYRINGE | INTRAVENOUS | Status: DC | PRN
  Administered 2022-11-14 (×2): 5 mg via INTRAVENOUS

## 2022-11-14 MED ORDER — SODIUM CHLORIDE 0.9 % IV SOLN: INTRAVENOUS | Status: DC | PRN

## 2022-11-14 MED ORDER — APIXABAN 5 MG PO TABS
5.0000 mg | ORAL_TABLET | Freq: Two times a day (BID) | ORAL | Status: DC
  Administered 2022-11-14 – 2022-11-15 (×2): 5 mg via ORAL
  Filled 2022-11-14 (×2): qty 1

## 2022-11-14 MED ORDER — ATORVASTATIN CALCIUM 10 MG PO TABS
10.0000 mg | ORAL_TABLET | Freq: Every day | ORAL | Status: DC
  Administered 2022-11-14 – 2022-11-15 (×2): 10 mg via ORAL
  Filled 2022-11-14 (×2): qty 1

## 2022-11-14 MED ORDER — PROPOFOL 10 MG/ML IV BOLUS
INTRAVENOUS | Status: DC | PRN
  Administered 2022-11-14: 70 mg via INTRAVENOUS

## 2022-11-14 MED ORDER — ACETAMINOPHEN 325 MG PO TABS: 650.0000 mg | ORAL_TABLET | ORAL | Status: DC | PRN

## 2022-11-14 MED ORDER — LEVOTHYROXINE SODIUM 100 MCG PO TABS
100.0000 ug | ORAL_TABLET | Freq: Every day | ORAL | Status: DC
  Administered 2022-11-15: 100 ug via ORAL
  Filled 2022-11-14: qty 1

## 2022-11-14 MED ORDER — SODIUM CHLORIDE 0.9 % IV SOLN: INTRAVENOUS | Status: DC

## 2022-11-14 MED ORDER — ONDANSETRON HCL 4 MG/2ML IJ SOLN: 4.0000 mg | Freq: Four times a day (QID) | INTRAMUSCULAR | Status: DC | PRN

## 2022-11-14 SURGICAL SUPPLY — 1 items: ELECT DEFIB PAD ADLT CADENCE (PAD) ×1 IMPLANT

## 2022-11-14 NOTE — Anesthesia Postprocedure Evaluation (Signed)
Anesthesia Post Note  Patient: Carlos Choi  Procedure(s) Performed: CARDIOVERSION     Patient location during evaluation: PACU Anesthesia Type: General Level of consciousness: awake and alert Pain management: pain level controlled Vital Signs Assessment: post-procedure vital signs reviewed and stable Respiratory status: spontaneous breathing, nonlabored ventilation, respiratory function stable and patient connected to nasal cannula oxygen Cardiovascular status: blood pressure returned to baseline and stable Postop Assessment: no apparent nausea or vomiting Anesthetic complications: no  No notable events documented.  Last Vitals:  Vitals:   11/14/22 1235 11/14/22 1245  BP: (!) 146/66 134/66  Pulse: (!) 41 (!) 40  Resp: (!) 21 14  Temp:    SpO2: 96% 95%    Last Pain:  Vitals:   11/14/22 1245  TempSrc:   PainSc: 0-No pain                 Daymein Nunnery S

## 2022-11-14 NOTE — Anesthesia Procedure Notes (Signed)
Procedure Name: MAC Date/Time: 11/14/2022 12:25 PM  Performed by: Adria Dill, CRNAPre-anesthesia Checklist: Patient identified, Emergency Drugs available, Suction available and Patient being monitored Patient Re-evaluated:Patient Re-evaluated prior to induction Oxygen Delivery Method: Nasal cannula Preoxygenation: Pre-oxygenation with 100% oxygen Induction Type: IV induction Placement Confirmation: positive ETCO2 and breath sounds checked- equal and bilateral Dental Injury: Teeth and Oropharynx as per pre-operative assessment

## 2022-11-14 NOTE — Discharge Instructions (Addendum)
Electrical Cardioversion Electrical cardioversion is the delivery of a jolt of electricity to restore a normal rhythm to the heart. A rhythm that is too fast or is not regular keeps the heart from pumping well. In this procedure, sticky patches or metal paddles are placed on the chest to deliver electricity to the heart from a device. This procedure may be done in an emergency if: There is low or no blood pressure as a result of the heart rhythm. Normal rhythm must be restored as fast as possible to protect the brain and heart from further damage. It may save a life. This may also be a scheduled procedure for irregular or fast heart rhythms that are not immediately life-threatening.  What can I expect after the procedure? Your blood pressure, heart rate, breathing rate, and blood oxygen level will be monitored until you leave the hospital or clinic. Your heart rhythm will be watched to make sure it does not change. You may have some redness on the skin where the shocks were given. Over the counter cortizone cream may be helpful.  Follow these instructions at home: Do not drive for 24 hours if you were given a sedative during your procedure. Take over-the-counter and prescription medicines only as told by your health care provider. Ask your health care provider how to check your pulse. Check it often. Rest for 48 hours after the procedure or as told by your health care provider. Avoid or limit your caffeine use as told by your health care provider. Keep all follow-up visits as told by your health care provider. This is important. Contact a health care provider if: You feel like your heart is beating too quickly or your pulse is not regular. You have a serious muscle cramp that does not go away. Get help right away if: You have discomfort in your chest. You are dizzy or you feel faint. You have trouble breathing or you are short of breath. Your speech is slurred. You have trouble moving an  arm or leg on one side of your body. Your fingers or toes turn cold or blue. Summary Electrical cardioversion is the delivery of a jolt of electricity to restore a normal rhythm to the heart. This procedure may be done right away in an emergency or may be a scheduled procedure if the condition is not an emergency. Generally, this is a safe procedure. After the procedure, check your pulse often as told by your health care provider. This information is not intended to replace advice given to you by your health care provider. Make sure you discuss any questions you have with your health care provider. Document Revised: 01/04/2019 Document Reviewed: 01/04/2019 Elsevier Patient Education  2020 [employer].   How to Prepare for Your Cardiac PET/CT Stress Test:  1. Please do not take these medications before your test:   Medications that may interfere with the cardiac pharmacological stress agent (ex. nitrates - including erectile dysfunction medications, isosorbide mononitrate, tamulosin or beta-blockers) the day of the exam. (Erectile dysfunction medication should be held for at least 72 hrs prior to test) Theophylline containing medications for 12 hours. Dipyridamole 48 hours prior to the test. Your remaining medications may be taken with water.  2. Nothing to eat or drink, except water, 3 hours prior to arrival time.   NO caffeine/decaffeinated products, or chocolate 12 hours prior to arrival.  3. NO perfume, cologne or lotion  4. Total time is 1 to 2 hours; you may want to bring reading  material for the waiting time.  5. Please report to Radiology at the [facility] Main Entrance 30 minutes early for your test.  [address] [place], [place] [address]  Diabetic Preparation:  Hold oral medications. You may take NPH and Lantus insulin. Do not take Humalog or Humulin R (Regular Insulin) the day of your test. Check blood sugars prior to leaving the house. If able to eat  breakfast prior to 3 hour fasting, you may take all medications, including your insulin, Do not worry if you miss your breakfast dose of insulin - start at your next meal.  IF YOU THINK YOU MAY BE PREGNANT, OR ARE NURSING PLEASE INFORM THE TECHNOLOGIST.  In preparation for your appointment, medication and supplies will be purchased.  Appointment availability is limited, so if you need to cancel or reschedule, please call the Radiology Department at [phone]  24 hours in advance to avoid a cancellation fee of $100.00  What to Expect After you Arrive:  Once you arrive and check in for your appointment, you will be taken to a preparation room within the Radiology Department.  A technologist or Nurse will obtain your medical history, verify that you are correctly prepped for the exam, and explain the procedure.  Afterwards,  an IV will be started in your arm and electrodes will be placed on your skin for EKG monitoring during the stress portion of the exam. Then you will be escorted to the PET/CT scanner.  There, staff will get you positioned on the scanner and obtain a blood pressure and EKG.  During the exam, you will continue to be connected to the EKG and blood pressure machines.  A small, safe amount of a radioactive tracer will be injected in your IV to obtain a series of pictures of your heart along with an injection of a stress agent.    After your Exam:  It is recommended that you eat a meal and drink a caffeinated beverage to counter act any effects of the stress agent.  Drink plenty of fluids for the remainder of the day and urinate frequently for the first couple of hours after the exam.  Your doctor will inform you of your test results within 7-10 business days.  For questions about your test or how to prepare for your test, please call: Rockwell Alexandria, Cardiac Imaging Nurse Navigator  Larey Brick, Cardiac Imaging Nurse Navigator Office: [phone]

## 2022-11-14 NOTE — H&P (Signed)
Cardiology Admission History and Physical   Patient ID: EULICE STRANZ MRN: [id number]; DOB: October 05, 1945   Admission date: 11/14/2022  PCP:  [name], MD   [facility] Providers Cardiologist:  None        Chief Complaint: Bradycardia post cardioversion, conduction disorder  Patient Profile:   Carlos Choi is a 77 y.o. male with paroxysmal atrial flutter who is being seen 11/14/2022 for the evaluation of bradycardia postconversion.  History of Present Illness:   Carlos Choi 77 year old male who has been experiencing increasing fatigue, some shortness of breath over the past month or 2 and has noticed his heart rates in the 40s and 50s at times.  An EKG was previously performed and demonstrated atrial flutter with variable conduction.  He was placed on Eliquis and set up for cardioversion today on 11/14/2022.  Cardioversion was successful however heart rates originally were in the 30s with conduction abnormality noted.  Ephedrine x 2 was administered per anesthesia.  As he continued to awaken, various degrees of conduction abnormality noted with first-degree AV block then second-degree AV block type I then 2-1 AV nodal conduction then what looks like junctional escape with some A-V dissociation.  Spoke with his wife.  She stated that up until recently his heart rates were usually in the 80s.  No prior cardiac issues.  History of tobacco use chronic sinusitis hypothyroidism.  Prior TSH in our system in 2023 was normal.  Went in for sinus surgery but this was canceled secondary to atrial flutter.   Past Medical History:  Diagnosis Date   Allergy    Anxiety    Arthritis    mild ankles    Cataract    removed left eye, forming right eye    Diverticulosis    Hemorrhoids    Hypothyroid    Kidney stones    Rectal bleeding    due to hemorrhoid    Tubular adenoma of colon 2015    Past Surgical History:  Procedure Laterality Date   CATARACT EXTRACTION Left     COLONOSCOPY     HERNIA REPAIR Bilateral    Over ten years ago per patient. He was not sure of the date.   POLYPECTOMY     ROTATOR CUFF REPAIR     both shoulders - patient does not remember the exact date     Medications Prior to Admission: Prior to Admission medications   Medication Sig Start Date End Date Taking? Authorizing Provider  acetaminophen (TYLENOL) 650 MG CR tablet Take 1,300 mg by mouth 2 (two) times a week.   Yes [provider]  ALPRAZolam (XANAX) 0.25 MG tablet Take 1 tablet (0.25 mg total) by mouth daily as needed for anxiety. 12/06/21  Yes [name], MD  apixaban (ELIQUIS) 5 MG TABS tablet Take 1 tablet (5 mg total) by mouth 2 (two) times daily. 10/22/22  Yes Eustace Pen, PA-C  atorvastatin (LIPITOR) 10 MG tablet Take 1 tablet (10 mg total) by mouth daily. 12/06/21  Yes [name], MD  Guaifenesin [facility] MAXIMUM STRENGTH) 1200 MG TB12 Take 1,200 mg by mouth daily as needed (Congestion).   Yes [provider]  hyoscyamine (LEVSIN) 0.125 MG tablet Take 1 tablet (0.125 mg total) by mouth every 6 (six) hours as needed for cramping (diarrhea). 03/11/22  Yes Quentin Mulling R, PA-C  ipratropium (ATROVENT) 0.03 % nasal spray Place 2 sprays into both nostrils daily as needed for rhinitis.   Yes [provider]  Lactobacillus-Inulin (CULTURELLE DIGESTIVE HEALTH PO) Take 1 capsule by mouth daily.   Yes [provider]  levothyroxine (SYNTHROID) 100 MCG tablet Take 1 tablet (100 mcg total) by mouth daily. 12/06/21  Yes [name], MD  loratadine (CLARITIN) 10 MG tablet Take 10 mg by mouth daily.   Yes [provider]  MOMETASONE FURO & DIMETHICONE EX Place 2 drops in ear(s) daily as needed (Itching /fluid).   Yes [provider]  Multiple Vitamins-Minerals (CENTRUM SILVER PO) Take 1 tablet by mouth daily.   Yes [provider]  Omega-3 Fatty Acids (FISH OIL) 1000 MG CAPS Take 2,000 mg by mouth daily.    Yes [provider]  PSYLLIUM PO Take 1 packet by mouth daily as needed (Regulate bowel movement). Taking 1 tsp daily in water   Yes [provider]  silodosin (RAPAFLO) 8 MG CAPS capsule Take 8 mg by mouth daily. 02/15/22  Yes [provider]  Sod Bicarb-K Bicarb-Citric Acd (ALKA-SELTZER GOLD) [phone] MG TBEF Take 2 tablets by mouth daily as needed (Upset stomach).   Yes [provider]     Allergies:   No Known Allergies  Social History:   Social History   Socioeconomic History   Marital status: Married    Spouse name: [name].   Number of children: 2   Years of education: Not on file   Highest education level: Not on file  Occupational History   Not on file  Tobacco Use   Smoking status: Former    Packs/day: 0.50    Years: 6.00    Additional pack years: 0.00    Total pack years: 3.00    Types: Cigarettes    Quit date: 06/18/1979    Years since quitting: 43.4   Smokeless tobacco: Never  Vaping Use   Vaping Use: Never used  Substance and Sexual Activity   Alcohol use: Yes    Comment: 2 beers Monthly.   Drug use: No   Sexual activity: Not on file  Other Topics Concern   Not on file  Social History Narrative   Not on file   Social Determinants of Health   Financial Resource Strain: Low Risk  (10/27/2020)   Overall Financial Resource Strain (CARDIA)    Difficulty of Paying Living Expenses: Not hard at all  Food Insecurity: No Food Insecurity (10/27/2020)   Hunger Vital Sign    Worried About Running Out of Food in the Last Year: Never true    Ran Out of Food in the Last Year: Never true  Transportation Needs: No Transportation Needs (10/27/2020)   PRAPARE - Administrator, Civil Service (Medical): No    Lack of Transportation (Non-Medical): No  Physical Activity: Sufficiently Active (10/27/2020)   Exercise Vital Sign    Days of Exercise per Week: 7 days    Minutes of Exercise per Session: 30 min  Stress: No Stress  Concern Present (10/27/2020)   [employer] of Occupational Health - Occupational Stress Questionnaire    Feeling of Stress : Not at all  Social Connections: Moderately Integrated (10/27/2020)   Social Connection and Isolation Panel [NHANES]    Frequency of Communication with Friends and Family: More than three times a week    Frequency of Social Gatherings with Friends and Family: Three times a week    Attends Religious Services: More than 4 times per year    Active Member of Clubs or Organizations: No    Attends  Club or Organization Meetings: Never    Marital Status: Married  Catering manager Violence: Not At Risk (10/27/2020)   Humiliation, Afraid, Rape, and Kick questionnaire    Fear of Current or Ex-Partner: No    Emotionally Abused: No    Physically Abused: No    Sexually Abused: No    Family History:   [name] family history includes Alzheimer's disease in his father; Aneurysm in his mother; Cancer in his sister; Hypertension in his brother; Stroke in his brother. There is no history of Colon cancer, Esophageal cancer, Stomach cancer, Rectal cancer, or Colon polyps.    ROS:  Please see the history of present illness.  No syncope all other ROS reviewed and negative.     Physical Exam/Data:   Vitals:   November 21, 2022 1235 11/21/22 1245 11/21/2022 1255 Nov 21, 2022 1300  BP: (!) 146/66 134/66 (!) 143/70 (!) 167/70  Pulse: (!) 41 (!) 40 (!) 44 (!) 51  Resp: (!) 21 14 15 17   Temp:      TempSrc:      SpO2: 96% 95% 98% 99%  Weight:      Height:        Intake/Output Summary (Last 24 hours) at 11/21/22 1307 Last data filed at 11/21/2022 1230 Gross per 24 hour  Intake 200 ml  Output --  Net 200 ml      11/21/2022   11:39 AM 11/07/2022    2:01 PM 10/22/2022    2:49 PM  Last 3 Weights  Weight (lbs) 187 lb 186 lb 12.8 oz 189 lb  Weight (kg) 84.823 kg 84.732 kg 85.73 kg     Body mass index is 22.76 kg/m.  General:  Well nourished, well developed, in no acute distress, 6 foot  4, tall HEENT: normal Neck: no JVD Vascular: No carotid bruits; Distal pulses 2+ bilaterally   Cardiac: Bradycardic, slightly irregular with ectopy, no murmur Lungs:  clear to auscultation bilaterally, no wheezing, rhonchi or rales  Abd: soft, nontender, no hepatomegaly  Ext: no edema Musculoskeletal:  No deformities, BUE and BLE strength normal and equal Skin: warm and dry  Neuro:  CNs 2-12 intact, no focal abnormalities noted Psych:  Normal affect    EKG: Multiple EKGs reviewed from atrial flutter to sinus rhythm with variable conduction  Relevant CV Studies: Echocardiogram pending  Laboratory Data:  High Sensitivity Troponin:  No results for input(s): "TROPONINIHS" in the last 720 hours.    Chemistry Recent Labs  Lab 11/07/22 1353  NA 138  K 4.2  CL 104  CO2 24  GLUCOSE 95  BUN 13  CREATININE 1.06  CALCIUM 9.1  GFRNONAA >60  ANIONGAP 10    No results for input(s): "PROT", "ALBUMIN", "AST", "ALT", "ALKPHOS", "BILITOT" in the last 168 hours. Lipids No results for input(s): "CHOL", "TRIG", "HDL", "LABVLDL", "LDLCALC", "CHOLHDL" in the last 168 hours. Hematology Recent Labs  Lab 11/07/22 1353  WBC 5.2  RBC 5.29  HGB 16.4  HCT 47.8  MCV 90.4  MCH 31.0  MCHC 34.3  RDW 14.1  PLT 131*   Thyroid No results for input(s): "TSH", "FREET4" in the last 168 hours. BNPNo results for input(s): "BNP", "PROBNP" in the last 168 hours.  DDimer No results for input(s): "DDIMER" in the last 168 hours.   Radiology/Studies:  EP STUDY  Result Date: 21-Nov-2022 See surgical note for result.    Assessment and Plan:   Symptomatic bradycardia Second-degree heart block Paroxysmal atrial flutter -Postconversion, bradycardia noted.  Transient first-degree AV block  with second-degree AV block type I then 2-1 AV block, and periods of what looks like A-V dissociation with junctional escape with heart rates in the 40s noted post cardioversion approximately 1 hour post propofol.  He  was given 2 doses of ephedrine by anesthesia, Dr. Okey Dupre immediately post cardioversion. - We will go ahead and bring him in for observation.  This may be in part residual effects of propofol however he certainly could have underlying conduction disease.  In speaking with his wife Darl Pikes, his heart rate recently has been in the 40s to 48s which is unusual for him.  I wonder if he was experiencing fatigue with conduction disorder at home. -Avoid AV nodal blocking agents.  He was not on any AV nodal blocking agent prior to procedure. -We will continue with Eliquis post cardioversion. -We will make him n.p.o. past midnight in case pacemaker procedure is warranted.   -Observe on telemetry unit. -Check TSH -Check echocardiogram   Risk Assessment/Risk Scores:       CHA2DS2-VASc Score = 2   This indicates a 2.2% annual risk of stroke. [name] score is based upon: CHF History: 0 HTN History: 0 Diabetes History: 0 Stroke History: 0 Vascular Disease History: 0 Age Score: 2 Gender Score: 0      Severity of Illness: The appropriate patient status for this patient is OBSERVATION. Observation status is judged to be reasonable and necessary in order to provide the required intensity of service to ensure the patient's safety. [name] presenting symptoms, physical exam findings, and initial radiographic and laboratory data in the context of their medical condition is felt to place them at decreased risk for further clinical deterioration. Furthermore, it is anticipated that the patient will be medically stable for discharge from the hospital within 2 midnights of admission.    For questions or updates, please contact  [facility] Please consult [email or web address] for contact info under     Signed, Donato Schultz, MD  11/14/2022 1:07 PM

## 2022-11-14 NOTE — Transfer of Care (Signed)
Immediate Anesthesia Transfer of Care Note  Patient: Carlos Choi  Procedure(s) Performed: CARDIOVERSION  Patient Location: PACU  Anesthesia Type:General  Level of Consciousness: awake and patient cooperative  Airway & Oxygen Therapy: Patient Spontanous Breathing and Patient connected to nasal cannula oxygen  Post-op Assessment: Report given to RN and Post -op Vital signs reviewed and stable  Post vital signs: Reviewed and stable  Last Vitals:  Vitals Value Taken Time  BP 149/109 11/14/22 1220  Temp    Pulse 51 11/14/22 1220  Resp 16 11/14/22 1220  SpO2 99 % 11/14/22 1220  Vitals shown include unvalidated device data.  Last Pain:  Vitals:   11/14/22 1139  TempSrc: Temporal         Complications: No notable events documented.

## 2022-11-14 NOTE — CV Procedure (Signed)
    Electrical Cardioversion Procedure Note Carlos Choi [id number] 1946-03-30  Procedure: Electrical Cardioversion Indications:  Atrial Flutter  Time Out: Verified patient identification, verified procedure,medications/allergies/relevent history reviewed, required imaging and test results available.  Performed  Procedure Details  The patient was NPO after midnight. Anesthesia was administered at the beside  by Dr. Okey Dupre with propofol.  Cardioversion was performed with synchronized biphasic defibrillation via AP pads with 120 joules.  1 attempt(s) were performed.  The patient converted.The patient tolerated the procedure well, however bradycardia was noted initially.  2 doses of ephedrine were administered.  He continued to show high degree AV block.  Mentating well.  IMPRESSION:  Successful cardioversion of atrial flutter however bradycardia with A-V block noted postconversion, post propofol.  Bringing in for observation.  Discussed with wife.    Pina Sirianni 11/14/2022, 1:18 PM

## 2022-11-14 NOTE — Interval H&P Note (Signed)
History and Physical Interval Note:  11/14/2022 12:18 PM  Carlos Choi  has presented today for surgery, with the diagnosis of afib.  The various methods of treatment have been discussed with the patient and family. After consideration of risks, benefits and other options for treatment, the patient has consented to  Procedure(s): CARDIOVERSION (N/A) as a surgical intervention.  [name] history has been reviewed, patient examined, no change in status, stable for surgery.  I have reviewed the patient's chart and labs.  Questions were answered to the patient's satisfaction.     [employer]

## 2022-11-14 NOTE — Anesthesia Preprocedure Evaluation (Signed)
Anesthesia Evaluation  Patient identified by MRN, date of birth, ID band Patient awake    Reviewed: Allergy & Precautions, H&P , NPO status , Patient's Chart, lab work & pertinent test results  Airway Mallampati: II  TM Distance: >3 FB Neck ROM: Full    Dental no notable dental hx.    Pulmonary neg pulmonary ROS, former smoker   Pulmonary exam normal breath sounds clear to auscultation       Cardiovascular Normal cardiovascular exam+ dysrhythmias Atrial Fibrillation  Rhythm:Irregular Rate:Normal     Neuro/Psych negative neurological ROS  negative psych ROS   GI/Hepatic negative GI ROS, Neg liver ROS,,,  Endo/Other  Hypothyroidism    Renal/GU negative Renal ROS  negative genitourinary   Musculoskeletal negative musculoskeletal ROS (+)    Abdominal   Peds negative pediatric ROS (+)  Hematology negative hematology ROS (+)   Anesthesia Other Findings   Reproductive/Obstetrics negative OB ROS                             Anesthesia Physical Anesthesia Plan  ASA: 3  Anesthesia Plan: General   Post-op Pain Management: Minimal or no pain anticipated   Induction: Intravenous  PONV Risk Score and Plan: Treatment may vary due to age or medical condition  Airway Management Planned: Nasal Cannula  Additional Equipment:   Intra-op Plan:   Post-operative Plan:   Informed Consent: I have reviewed the patients History and Physical, chart, labs and discussed the procedure including the risks, benefits and alternatives for the proposed anesthesia with the patient or authorized representative who has indicated his/her understanding and acceptance.     Dental advisory given  Plan Discussed with: CRNA and Surgeon  Anesthesia Plan Comments:        Anesthesia Quick Evaluation

## 2022-11-15 ENCOUNTER — Encounter (HOSPITAL_COMMUNITY): Payer: Self-pay | Admitting: Cardiology

## 2022-11-15 ENCOUNTER — Other Ambulatory Visit: Payer: Self-pay | Admitting: Cardiology

## 2022-11-15 ENCOUNTER — Observation Stay (HOSPITAL_BASED_OUTPATIENT_CLINIC_OR_DEPARTMENT_OTHER): Payer: Medicare Other

## 2022-11-15 DIAGNOSIS — I110 Hypertensive heart disease with heart failure: Secondary | ICD-10-CM | POA: Diagnosis not present

## 2022-11-15 DIAGNOSIS — I519 Heart disease, unspecified: Secondary | ICD-10-CM | POA: Diagnosis not present

## 2022-11-15 DIAGNOSIS — I441 Atrioventricular block, second degree: Secondary | ICD-10-CM

## 2022-11-15 DIAGNOSIS — Z79899 Other long term (current) drug therapy: Secondary | ICD-10-CM | POA: Diagnosis not present

## 2022-11-15 DIAGNOSIS — R9431 Abnormal electrocardiogram [ECG] [EKG]: Secondary | ICD-10-CM

## 2022-11-15 DIAGNOSIS — I4891 Unspecified atrial fibrillation: Secondary | ICD-10-CM | POA: Diagnosis not present

## 2022-11-15 DIAGNOSIS — Z87891 Personal history of nicotine dependence: Secondary | ICD-10-CM | POA: Diagnosis not present

## 2022-11-15 DIAGNOSIS — I5020 Unspecified systolic (congestive) heart failure: Secondary | ICD-10-CM | POA: Diagnosis not present

## 2022-11-15 DIAGNOSIS — E039 Hypothyroidism, unspecified: Secondary | ICD-10-CM | POA: Diagnosis not present

## 2022-11-15 DIAGNOSIS — Z7901 Long term (current) use of anticoagulants: Secondary | ICD-10-CM | POA: Diagnosis not present

## 2022-11-15 DIAGNOSIS — I483 Typical atrial flutter: Secondary | ICD-10-CM | POA: Diagnosis not present

## 2022-11-15 DIAGNOSIS — I420 Dilated cardiomyopathy: Secondary | ICD-10-CM | POA: Diagnosis not present

## 2022-11-15 DIAGNOSIS — I4892 Unspecified atrial flutter: Secondary | ICD-10-CM | POA: Diagnosis not present

## 2022-11-15 DIAGNOSIS — I5022 Chronic systolic (congestive) heart failure: Secondary | ICD-10-CM

## 2022-11-15 LAB — ECHOCARDIOGRAM COMPLETE
Calc EF: 47 %
Height: 76 in
MV M vel: 5.09 m/s
MV Peak grad: 103.6 mmHg
Radius: 0.6 cm
S' Lateral: 5 cm
Single Plane A2C EF: 42.5 %
Single Plane A4C EF: 53.2 %
Weight: 2992 oz

## 2022-11-15 LAB — BASIC METABOLIC PANEL
Anion gap: 9 (ref 5–15)
BUN: 18 mg/dL (ref 8–23)
CO2: 26 mmol/L (ref 22–32)
Calcium: 8.5 mg/dL — ABNORMAL LOW (ref 8.9–10.3)
Chloride: 104 mmol/L (ref 98–111)
Creatinine, Ser: 1.13 mg/dL (ref 0.61–1.24)
GFR, Estimated: >60 mL/min (ref >60)
Glucose, Bld: 98 mg/dL (ref 70–99)
Potassium: 4.1 mmol/L (ref 3.5–5.1)
Sodium: 139 mmol/L (ref 135–145)

## 2022-11-15 MED ORDER — LOSARTAN POTASSIUM 25 MG PO TABS: 25.0000 mg | ORAL_TABLET | Freq: Every day | ORAL | 1 refills | Status: DC

## 2022-11-15 NOTE — Progress Notes (Signed)
Echocardiogram 2D Echocardiogram has been performed.  Warren Lacy Tomislav Micale RDCS 11/15/2022, 10:47 AM

## 2022-11-15 NOTE — Progress Notes (Signed)
Discharge instructions reviewed with pt and his wife.  Copy of instructions given to pt. Pt informed his script for new med sent to his pharmacy for pick up. Pt and wife verbalized understanding of instructions.  Pt d/c'd via wheelchair with belongings, with wife.            Escorted by staff/hospital volunteer.   Annice Needy, RN SWOT

## 2022-11-15 NOTE — Discharge Summary (Addendum)
Discharge Summary    Patient ID: Carlos Choi MRN: [id number]; DOB: [birth year]  Admit date: 11/14/2022 Discharge date: 11/15/2022  PCP:  [name], MD   [facility] Providers Cardiologist:  None        Discharge Diagnoses    Principal Problem:   Atrial fibrillation [facility])    Diagnostic Studies/Procedures    11/14/22 DCCV  Procedure Details   The patient was NPO after midnight. Anesthesia was administered at the beside  by Dr. Okey Dupre with propofol.  Cardioversion was performed with synchronized biphasic defibrillation via AP pads with 120 joules.  1 attempt(s) were performed.  The patient converted.The patient tolerated the procedure well, however bradycardia was noted initially.  2 doses of ephedrine were administered.  He continued to show high degree AV block.  Mentating well.   IMPRESSION:   Successful cardioversion of atrial flutter however bradycardia with A-V block noted postconversion, post propofol.   Bringing in for observation.  Discussed with wife. _____________   History of Present Illness     Carlos Choi is a 77 y.o. male with paroxysmal atrial flutter who was admitted 11/14/2022 for the evaluation of bradycardia and heart block postconversion. Patient initially diagnosed with atrial fibrillation prior to procedure for maxillary antrostomy and was sent to afib clinic on 5/7. At that time, he was found to be in atrial flutter. Overall seemed to be without symptoms though did note increased fatigue over previous months. Patient was started on Eliquis and DCCV arranged.   Hospital Course     Consultants: n/a   Symptomatic bradycardia Second-degree heart block Paroxysmal atrial flutter   Patient underwent DCCV with Dr. Anne Fu yesterday. He had successful cardioversion but then developed bradycardia with abnormal conduction. As anesthesia wore off, patient had various degrees of conduction abnormality including first degree AVB, then second  degree type I AV block, then 2-1 AV nodal conduction with junctional escape.    Overnight, patient with improvement in HR. Continues to have mixed 1st degree AVB and second degree type I AVB with junctional escape beats and occasional PVCs. This morning, rate 60s-70s while patient is awake and active, 40s-50s at rest. No pre-syncopal symptoms Continue Eliquis 5mg  BID. NO AV nodal agents. Afib clinic follow up has already been scheduled for 6/5. Patient will also see Dr. [name] for [facility] evaluation on 6/21.  New HFmrEF Dilated LV Hypertension  TTE this admission with newly noted decreased LVEF 45% and dilated LV. Given pending EP visit for PPM discussion will also plan to evaluate coronary anatomy. Given irregular rhythm, cardiac PET stress is the most appropriate study. This has been ordered/scheduled for 6/19 and patient has been given day of instructions in AVS.   Patient currently euvolemic/asymptomatic. Will initiate low dose Losartan 25mg  for HF GDMT. Defer SGLT2 given possibility of PPM in near future. Will also hold on MRA given low HR, do not want to precipitate orthostatic symptoms. Already has BMP with PCP scheduled for 6/21.      Did the patient have an acute coronary syndrome (MI, NSTEMI, STEMI, etc) this admission?:  No                               Did the patient have a percutaneous coronary intervention (stent / angioplasty)?:  No.          _____________  Discharge Vitals Blood pressure (!) 146/65, pulse 64, temperature 98.3 F (36.8 C), temperature  source Oral, resp. rate 16, height 6\' 4"  (1.93 m), weight 84.8 kg, SpO2 97 %.  Filed Weights   11/14/22 1139  Weight: 84.8 kg   Physical Exam Vitals reviewed.  Constitutional:      Appearance: Normal appearance.  HENT:     Head: Normocephalic.     Mouth/Throat:     Mouth: Mucous membranes are moist.  Eyes:     Pupils: Pupils are equal, round, and reactive to light.  Cardiovascular:     Rate and Rhythm: Bradycardia  present. Rhythm irregular.     Pulses: Normal pulses.     Heart sounds: Normal heart sounds.  Pulmonary:     Effort: Pulmonary effort is normal.     Breath sounds: Normal breath sounds.  Abdominal:     General: Abdomen is flat.     Palpations: Abdomen is soft.  Musculoskeletal:        General: Normal range of motion.  Skin:    General: Skin is warm and dry.     Capillary Refill: Capillary refill takes less than 2 seconds.  Neurological:     General: No focal deficit present.     Mental Status: He is alert and oriented to person, place, and time.  Psychiatric:        Mood and Affect: Mood normal.        Behavior: Behavior normal.        Thought Content: Thought content normal.        Judgment: Judgment normal.      Labs & Radiologic Studies    CBC No results for input(s): "WBC", "NEUTROABS", "HGB", "HCT", "MCV", "PLT" in the last 72 hours. Basic Metabolic Panel Recent Labs    16/10/96 0122  NA 139  K 4.1  CL 104  CO2 26  GLUCOSE 98  BUN 18  CREATININE 1.13  CALCIUM 8.5*   Liver Function Tests No results for input(s): "AST", "ALT", "ALKPHOS", "BILITOT", "PROT", "ALBUMIN" in the last 72 hours. No results for input(s): "LIPASE", "AMYLASE" in the last 72 hours. High Sensitivity Troponin:   No results for input(s): "TROPONINIHS" in the last 720 hours.  BNP Invalid input(s): "POCBNP" D-Dimer No results for input(s): "DDIMER" in the last 72 hours. Hemoglobin A1C No results for input(s): "HGBA1C" in the last 72 hours. Fasting Lipid Panel No results for input(s): "CHOL", "HDL", "LDLCALC", "TRIG", "CHOLHDL", "LDLDIRECT" in the last 72 hours. Thyroid Function Tests Recent Labs    11/14/22 1529  TSH 1.323   _____________  ECHOCARDIOGRAM COMPLETE  Result Date: 11/15/2022    ECHOCARDIOGRAM REPORT   Patient Name:   Carlos Choi Date of Exam: 11/15/2022 Medical Rec #:  [id number]       Height:       76.0 in Accession #:    [id number]      Weight:       187.0 lb Date  of Birth:  [birth year]       BSA:          2.153 m Patient Age:    77 years        BP:           146/65 mmHg Patient Gender: M               HR:           60 bpm. Exam Location:  Inpatient Procedure: 2D Echo, Color Doppler and Cardiac Doppler Indications:    R94.31 Abnormal EKG  History:  Patient has no prior history of Echocardiogram examinations.  Sonographer:    Irving Burton Senior RDCS Referring Phys: [id number] MARK C SKAINS IMPRESSIONS  1. Left ventricular ejection fraction, by estimation, is 45 to 50%. Left ventricular ejection fraction by 2D MOD biplane is 47.0 %. The left ventricle has mildly decreased function. The left ventricle demonstrates global hypokinesis. The left ventricular internal cavity size was mildly dilated. Left ventricular diastolic parameters are indeterminate.  2. Right ventricular systolic function is normal. The right ventricular size is moderately enlarged. Tricuspid regurgitation signal is inadequate for assessing PA pressure.  3. Left atrial size was moderately dilated.  4. Right atrial size was severely dilated.  5. The mitral valve is abnormal. Moderate mitral valve regurgitation.  6. The aortic valve is tricuspid. Aortic valve regurgitation is trivial.  7. The inferior vena cava is normal in size with <50% respiratory variability, suggesting right atrial pressure of 8 mmHg.  8. Evidence of atrial level shunting detected by color flow Doppler. There is a small secundum atrial septal defect with predominantly left to right shunting across the atrial septum. FINDINGS  Left Ventricle: Left ventricular ejection fraction, by estimation, is 45 to 50%. Left ventricular ejection fraction by 2D MOD biplane is 47.0 %. The left ventricle has mildly decreased function. The left ventricle demonstrates global hypokinesis. The left ventricular internal cavity size was mildly dilated. There is no left ventricular hypertrophy. Left ventricular diastolic parameters are indeterminate. Right Ventricle: The  right ventricular size is moderately enlarged. No increase in right ventricular wall thickness. Right ventricular systolic function is normal. Tricuspid regurgitation signal is inadequate for assessing PA pressure. The tricuspid regurgitant velocity is 2.00 m/s, and with an assumed right atrial pressure of 8 mmHg, the estimated right ventricular systolic pressure is 24.0 mmHg. Left Atrium: Left atrial size was moderately dilated. Right Atrium: Right atrial size was severely dilated. Pericardium: There is no evidence of pericardial effusion. Mitral Valve: The mitral valve is abnormal. There is mild thickening of the mitral valve leaflet(s). Moderate mitral valve regurgitation. Tricuspid Valve: The tricuspid valve is normal in structure. Tricuspid valve regurgitation is trivial. Aortic Valve: The aortic valve is tricuspid. Aortic valve regurgitation is trivial. Pulmonic Valve: The pulmonic valve was normal in structure. Pulmonic valve regurgitation is mild. Aorta: The aortic root and ascending aorta are structurally normal, with no evidence of dilitation. Venous: The inferior vena cava is normal in size with less than 50% respiratory variability, suggesting right atrial pressure of 8 mmHg. IAS/Shunts: Evidence of atrial level shunting detected by color flow Doppler. There is a small secundum atrial septal defect with predominantly left to right shunting across the atrial septum.  LEFT VENTRICLE PLAX 2D                        Biplane EF (MOD) LVIDd:         6.10 cm         LV Biplane EF:   Left LVIDs:         5.00 cm                          ventricular LV PW:         0.70 cm                          ejection LV IVS:        0.80 cm  fraction by LVOT diam:     2.20 cm                          2D MOD LV SV:         84                               biplane is LV SV Index:   39                               47.0 %. LVOT Area:     3.80 cm  LV Volumes (MOD) LV vol d, MOD    148.0 ml A2C: LV vol d,  MOD    167.0 ml A4C: LV vol s, MOD    85.1 ml A2C: LV vol s, MOD    78.1 ml A4C: LV SV MOD A2C:   62.9 ml LV SV MOD A4C:   167.0 ml LV SV MOD BP:    74.4 ml RIGHT VENTRICLE RV S prime:     12.00 cm/s TAPSE (M-mode): 2.1 cm LEFT ATRIUM             Index        RIGHT ATRIUM           Index LA diam:        4.70 cm 2.18 cm/m   RA Area:     26.00 cm LA Vol (A2C):   65.2 ml 30.29 ml/m  RA Volume:   92.50 ml  42.97 ml/m LA Vol (A4C):   79.0 ml 36.70 ml/m LA Biplane Vol: 74.7 ml 34.70 ml/m  AORTIC VALVE LVOT Vmax:   95.80 cm/s LVOT Vmean:  61.100 cm/s LVOT VTI:    0.220 m  AORTA Ao Root diam: 3.20 cm Ao Asc diam:  3.30 cm MR Peak grad:    103.6 mmHg   TRICUSPID VALVE MR Mean grad:    75.0 mmHg    TR Peak grad:   16.0 mmHg MR Vmax:         509.00 cm/s  TR Vmax:        200.00 cm/s MR Vmean:        416.0 cm/s MR PISA:         2.26 cm     SHUNTS MR PISA Eff ROA: 17 mm       Systemic VTI:  0.22 m MR PISA Radius:  0.60 cm      Systemic Diam: 2.20 cm [name] MD Electronically signed by [name] MD Signature Date/Time: 11/15/2022/12:40:45 PM    Final    EP STUDY  Result Date: 11/14/2022 See surgical note for result.  Disposition   Pt is being discharged home today in good condition.  Follow-up Plans & Appointments     Discharge Instructions     Diet - low sodium heart healthy   Complete by: As directed    Increase activity slowly   Complete by: As directed         Discharge Medications   Allergies as of 11/15/2022   No Known Allergies      Medication List     TAKE these medications    acetaminophen 650 MG CR tablet Commonly known as: TYLENOL Take 1,300 mg by mouth 2 (two) times a week.   Alka-Seltzer Gold [phone] MG Tbef Generic  drug: Sod Bicarb-K Bicarb-Citric Acd Take 2 tablets by mouth daily as needed (Upset stomach).   ALPRAZolam 0.25 MG tablet Commonly known as: XANAX Take 1 tablet (0.25 mg total) by mouth daily as needed for anxiety.   apixaban 5 MG  Tabs tablet Commonly known as: Eliquis Take 1 tablet (5 mg total) by mouth 2 (two) times daily.   atorvastatin 10 MG tablet Commonly known as: LIPITOR Take 1 tablet (10 mg total) by mouth daily.   CENTRUM SILVER PO Take 1 tablet by mouth daily.   CULTURELLE DIGESTIVE HEALTH PO Take 1 capsule by mouth daily.   Fish Oil 1000 MG Caps Take 2,000 mg by mouth daily.   hyoscyamine 0.125 MG tablet Commonly known as: LEVSIN Take 1 tablet (0.125 mg total) by mouth every 6 (six) hours as needed for cramping (diarrhea).   ipratropium 0.03 % nasal spray Commonly known as: ATROVENT Place 2 sprays into both nostrils daily as needed for rhinitis.   levothyroxine 100 MCG tablet Commonly known as: SYNTHROID Take 1 tablet (100 mcg total) by mouth daily.   loratadine 10 MG tablet Commonly known as: CLARITIN Take 10 mg by mouth daily.   losartan 25 MG tablet Commonly known as: COZAAR Take 1 tablet (25 mg total) by mouth daily.   MOMETASONE FURO & DIMETHICONE EX Place 2 drops in ear(s) daily as needed (Itching /fluid).   Mucinex Maximum Strength 1200 MG Tb12 Generic drug: Guaifenesin Take 1,200 mg by mouth daily as needed (Congestion).   PSYLLIUM PO Take 1 packet by mouth daily as needed (Regulate bowel movement). Taking 1 tsp daily in water   silodosin 8 MG Caps capsule Commonly known as: RAPAFLO Take 8 mg by mouth daily.           Outstanding Labs/Studies   Patient has labs pending with PCP on 6/21. This is appropriate timeframe for repeat BMP after initiation of ARB.  Duration of Discharge Encounter   Greater than 30 minutes including physician time.  Signed, Perlie Gold, PA-C 11/15/2022, 12:55 PM  I have seen and examined the patient along with Perlie Gold, PA-C.  I have reviewed the chart, notes and new data.  I agree with PA/NP's note.  Key new complaints: Denies chest discomfort.  Looking back, his exercise tolerance has been gradually diminishing over as  long as 2 or 3 years.  He has never experienced syncope or near syncope. Key examination changes: Irregular rhythm, monitor shows second-degree AV block Mobitz type II with heart rates in the high 50s-60s while awake, high 30s-40s while asleep.  Otherwise normal cardiovascular examination. Key new findings / data: Echo shows a mildly dilated left ventricle with mildly depressed left ventricular systolic function and a pattern of global hypokinesis.  PLAN: He has symptoms of fatigue and dyspnea are due at least in part to bradycardia from second-degree atrioventricular block, Mobitz type I.  He will benefit from pacemaker therapy, but would like to delay surgery until he has had 30 days of uninterrupted anticoagulation following his cardioversion. When he does receive the pacemaker, it would probably be best to start with either a CRT device or a left bundle branch block lead since it is likely he will have frequent ventricular pacing and he has depressed LV function. While we wait for the appropriate time to implant his pacemaker, will evaluate for possible causes for his cardiomyopathy, starting with a PET scan.  Unfortunately, unlikely to get images free of artifact on his CT coronary angiogram due to  the irregularity of his rhythm. Start ARB.  Avoid beta-blockers.  Down the road consider SGLT2 inhibitor and spironolactone.  Thurmon Fair, MD, [facility] [phone] 11/15/2022, 1:22 PM

## 2022-11-20 ENCOUNTER — Ambulatory Visit (HOSPITAL_COMMUNITY)
Admission: RE | Admit: 2022-11-20 | Discharge: 2022-11-20 | Disposition: A | Discharge status: Home / Self Care | Payer: Medicare Other | Source: Ambulatory Visit | Attending: Internal Medicine | Admitting: Internal Medicine

## 2022-11-20 ENCOUNTER — Encounter (HOSPITAL_COMMUNITY): Payer: Self-pay | Admitting: Internal Medicine

## 2022-11-20 ENCOUNTER — Telehealth: Payer: Self-pay | Admitting: Pharmacist

## 2022-11-20 VITALS — BP 132/74 | HR 66 | Ht 76.0 in | Wt 183.6 lb

## 2022-11-20 DIAGNOSIS — E039 Hypothyroidism, unspecified: Secondary | ICD-10-CM | POA: Insufficient documentation

## 2022-11-20 DIAGNOSIS — Z7901 Long term (current) use of anticoagulants: Secondary | ICD-10-CM | POA: Diagnosis not present

## 2022-11-20 DIAGNOSIS — I4891 Unspecified atrial fibrillation: Secondary | ICD-10-CM

## 2022-11-20 DIAGNOSIS — I480 Paroxysmal atrial fibrillation: Secondary | ICD-10-CM | POA: Diagnosis not present

## 2022-11-20 DIAGNOSIS — I483 Typical atrial flutter: Secondary | ICD-10-CM | POA: Diagnosis not present

## 2022-11-20 DIAGNOSIS — D6869 Other thrombophilia: Secondary | ICD-10-CM | POA: Insufficient documentation

## 2022-11-20 DIAGNOSIS — I4892 Unspecified atrial flutter: Secondary | ICD-10-CM | POA: Insufficient documentation

## 2022-11-20 DIAGNOSIS — Z8249 Family history of ischemic heart disease and other diseases of the circulatory system: Secondary | ICD-10-CM | POA: Insufficient documentation

## 2022-11-20 DIAGNOSIS — Z87891 Personal history of nicotine dependence: Secondary | ICD-10-CM | POA: Diagnosis not present

## 2022-11-20 NOTE — Progress Notes (Addendum)
Primary Care Physician: Carlos Brooks, MD Primary Cardiologist: None Primary Electrophysiologist: None Referring Physician:    [name] is a 77 y.o. male with a history of bronchiectasis, history of tobacco use, chronic sinusitis, hypothyroidism, and atrial flutter who presents for consultation in the [facility].  The patient was initially diagnosed with atrial flutter on day of procedure for maxillary antrostomy. He was found to be in atrial flutter from review of records on day of procedure with variable HR 35-70 bpm so procedure was canceled. He does not have cardiac awareness of arrhythmia. Patient was on aspirin 81 mg every other day. He is currently on Eliquis 5 mg BID for a CHADS2VASC score of 3.  On evaluation today, he is in atrial flutter. He cannot feel when he is in arrhythmia and was surprised when he was told on day of surgical procedure. It was canceled. He feels tired but states this symptom has been going on for months, since last year.   On follow up 11/20/22, he is in NSR with 1st degree AV block with PVCs in a pattern of bigeminy (versus Wenckebach). He underwent successful cardioversion on 11/14/22 however bradycardia with AV block noted post-conversion so patient was admitted for observation. As anesthesia wore off, patient had various degrees of conduction abnormality including first degree AVB, then second degree type I AV block, then 2-1 AV nodal conduction with junctional escape. He was not presyncopal. TTE showed newly noted decreased LVEF and placed on losartan 25 mg daily. Scheduled for cardiac PET stress to evaluate coronary anatomy. He is scheduled to see Dr. Nelly Choi on 6/21 for PPM evaluation. He has been compliant with Eliquis 5 mg BID. No episodes of syncope. He monitors his rhythm with Kardiamobile device.   Today, he denies symptoms of palpitations, chest pain, shortness of breath, orthopnea, PND, lower extremity edema,  dizziness, presyncope, syncope, snoring, daytime somnolence, bleeding, or neurologic sequela. The patient is tolerating medications without difficulties and is otherwise without complaint today.   Atrial Fibrillation Risk Factors:  he does not have symptoms or diagnosis of sleep apnea. he does not have a history of rheumatic fever. he does not have a history of alcohol use. The patient does not have a history of early familial atrial fibrillation or other arrhythmias.  he has a BMI of Body mass index is 22.35 kg/m.[name] Filed Weights   11/20/22 1427  Weight: 83.3 kg     Family History  Problem Relation Age of Onset   Aneurysm Mother    Alzheimer's disease Father    Cancer Sister        ovarian   Stroke Brother    Hypertension Brother    Colon cancer Neg Hx    Esophageal cancer Neg Hx    Stomach cancer Neg Hx    Rectal cancer Neg Hx    Colon polyps Neg Hx     Atrial Fibrillation Management history:  Previous antiarrhythmic drugs: None Previous cardioversions: None Previous ablations: None Anticoagulation history: Eliquis 5 mg BID   Past Medical History:  Diagnosis Date   Allergy    Anxiety    Arthritis    mild ankles    Cataract    removed left eye, forming right eye    Diverticulosis    Hemorrhoids    Hypothyroid    Kidney stones    Rectal bleeding    due to hemorrhoid    Tubular adenoma of colon 2015   Past Surgical  History:  Procedure Laterality Date   CARDIOVERSION N/A 11/14/2022   Procedure: CARDIOVERSION;  Surgeon: Carlos Bathe, MD;  Location: [facility];  Service: Cardiovascular;  Laterality: N/A;   CATARACT EXTRACTION Left    COLONOSCOPY     HERNIA REPAIR Bilateral    Over ten years ago per patient. He was not sure of the date.   POLYPECTOMY     ROTATOR CUFF REPAIR     both shoulders - patient does not remember the exact date    Current Outpatient Medications  Medication Sig Dispense Refill   acetaminophen (TYLENOL) 650 MG CR tablet  Take 1,300 mg by mouth 2 (two) times a week.     ALPRAZolam (XANAX) 0.25 MG tablet Take 1 tablet (0.25 mg total) by mouth daily as needed for anxiety. 90 tablet 0   apixaban (ELIQUIS) 5 MG TABS tablet Take 1 tablet (5 mg total) by mouth 2 (two) times daily. 60 tablet 3   atorvastatin (LIPITOR) 10 MG tablet Take 1 tablet (10 mg total) by mouth daily. 90 tablet 3   Guaifenesin (MUCINEX MAXIMUM STRENGTH) 1200 MG TB12 Take 1,200 mg by mouth daily as needed (Congestion).     hyoscyamine (LEVSIN) 0.125 MG tablet Take 1 tablet (0.125 mg total) by mouth every 6 (six) hours as needed for cramping (diarrhea). 30 tablet 0   ipratropium (ATROVENT) 0.03 % nasal spray Place 2 sprays into both nostrils daily as needed for rhinitis.     Lactobacillus-Inulin (CULTURELLE DIGESTIVE HEALTH PO) Take 1 capsule by mouth daily.     levothyroxine (SYNTHROID) 100 MCG tablet Take 1 tablet (100 mcg total) by mouth daily. 90 tablet 3   loratadine (CLARITIN) 10 MG tablet Take 10 mg by mouth daily.     losartan (COZAAR) 25 MG tablet Take 1 tablet (25 mg total) by mouth daily. 30 tablet 1   MOMETASONE FURO & DIMETHICONE EX Place 2 drops in ear(s) daily as needed (Itching /fluid).     Multiple Vitamins-Minerals (CENTRUM SILVER PO) Take 1 tablet by mouth daily.     Omega-3 Fatty Acids (FISH OIL) 1000 MG CAPS Take 2,000 mg by mouth daily.     PSYLLIUM PO Take 1 packet by mouth daily as needed (Regulate bowel movement). Taking 1 tsp daily in water     silodosin (RAPAFLO) 8 MG CAPS capsule Take 8 mg by mouth daily.     Sod Bicarb-K Bicarb-Citric Acd (ALKA-SELTZER GOLD) [id number] MG TBEF Take 2 tablets by mouth daily as needed (Upset stomach).     No current facility-administered medications for this encounter.    No Known Allergies  Social History   Socioeconomic History   Marital status: Married    Spouse name: Carlos Choi.   Number of children: 2   Years of education: Not on file   Highest education level: Not on file   Occupational History   Not on file  Tobacco Use   Smoking status: Former    Packs/day: 0.50    Years: 6.00    Additional pack years: 0.00    Total pack years: 3.00    Types: Cigarettes    Quit date: 06/18/1979    Years since quitting: 43.4   Smokeless tobacco: Never  Vaping Use   Vaping Use: Never used  Substance and Sexual Activity   Alcohol use: Yes    Comment: 1-2 beers Monthly.   Drug use: No   Sexual activity: Not on file  Other Topics Concern   Not  on file  Social History Narrative   Not on file   Social Determinants of Health   Financial Resource Strain: Low Risk  (10/27/2020)   Overall Financial Resource Strain (CARDIA)    Difficulty of Paying Living Expenses: Not hard at all  Food Insecurity: No Food Insecurity (11/15/2022)   Hunger Vital Sign    Worried About Running Out of Food in the Last Year: Never true    Ran Out of Food in the Last Year: Never true  Transportation Needs: No Transportation Needs (11/15/2022)   PRAPARE - Administrator, Civil Service (Medical): No    Lack of Transportation (Non-Medical): No  Physical Activity: Sufficiently Active (10/27/2020)   Exercise Vital Sign    Days of Exercise per Week: 7 days    Minutes of Exercise per Session: 30 min  Stress: No Stress Concern Present (10/27/2020)   [employer] of Occupational Health - Occupational Stress Questionnaire    Feeling of Stress : Not at all  Social Connections: Moderately Integrated (10/27/2020)   Social Connection and Isolation Panel [NHANES]    Frequency of Communication with Friends and Family: More than three times a week    Frequency of Social Gatherings with Friends and Family: Three times a week    Attends Religious Services: More than 4 times per year    Active Member of Clubs or Organizations: No    Attends Banker Meetings: Never    Marital Status: Married  Catering manager Violence: Not At Risk (11/15/2022)   Humiliation, Afraid, Rape, and Kick  questionnaire    Fear of Current or Ex-Partner: No    Emotionally Abused: No    Physically Abused: No    Sexually Abused: No     ROS- All systems are reviewed and negative except as per the HPI above.  Physical Exam: Vitals:   11/20/22 1427  BP: 132/74  Pulse: 66  Weight: 83.3 kg  Height: 6\' 4"  (1.93 m)    GEN- The patient is well appearing, alert and oriented x 3 today.   Head- normocephalic, atraumatic Eyes-  Sclera clear, conjunctiva pink Ears- hearing intact Lungs- Clear to ausculation bilaterally, normal work of breathing Heart- Regular rate and rhythm, no murmurs, rubs or gallops, PMI not laterally displaced Extremities- no clubbing, cyanosis, or edema MS- no significant deformity or atrophy Skin- no rash or lesion Psych- euthymic mood, full affect Neuro- strength and sensation are intact   Wt Readings from Last 3 Encounters:  11/20/22 83.3 kg  11/14/22 84.8 kg  11/07/22 84.7 kg    EKG today demonstrates  Vent. rate 66 BPM PR interval 360 ms QRS duration 92 ms QT/QTcB 434/454 ms P-R-T axes 99 73 -76 Sinus rhythm with 1st degree A-V block with frequent Premature ventricular complexes in a pattern of bigeminy Left ventricular hypertrophy with repolarization abnormality ( Sokolow-Lyon ) Abnormal ECG When compared with ECG of 14-Nov-2022 12:47, PREVIOUS ECG IS PRESENT  Echo 11/15/22: 1. Left ventricular ejection fraction, by estimation, is 45 to 50%. Left  ventricular ejection fraction by 2D MOD biplane is 47.0 %. The left  ventricle has mildly decreased function. The left ventricle demonstrates  global hypokinesis. The left  ventricular internal cavity size was mildly dilated. Left ventricular  diastolic parameters are indeterminate.   2. Right ventricular systolic function is normal. The right ventricular  size is moderately enlarged. Tricuspid regurgitation signal is inadequate  for assessing PA pressure.   3. Left atrial size was moderately dilated.  4. Right atrial size was severely dilated.   5. The mitral valve is abnormal. Moderate mitral valve regurgitation.   6. The aortic valve is tricuspid. Aortic valve regurgitation is trivial.   7. The inferior vena cava is normal in size with <50% respiratory  variability, suggesting right atrial pressure of 8 mmHg.   8. Evidence of atrial level shunting detected by color flow Doppler.  There is a small secundum atrial septal defect with predominantly left to  right shunting across the atrial septum.    Epic records are reviewed at length today.  CHA2DS2-VASc Score = 3  [name] score is based upon: CHF History: 1 HTN History: 0 Diabetes History: 0 Stroke History: 0 Vascular Disease History: 0 Age Score: 2 Gender Score: 0       ASSESSMENT AND PLAN: Paroxysmal Atrial Flutter (ICD10:  I48.0) [name] CHA2DS2-VASc score is 3, indicating a 3.2% annual risk of stroke.   S/p DCCV on 11/14/22 with post conversion AV block   He is in NSR with 1st degree AV block with PVCs in a pattern of bigeminy. It appears as though he may be in 2nd degree AV block (Wenckebach) but difficult to discern due to PVCs.  Discussion of his upcoming evaluation with Dr. Nelly Choi for [facility]. Discussion regarding his PET test prior to appt with Dr. Nelly Choi.   2. Secondary hypercoagulable state due to atrial flutter Continue Eliquis 5 mg BID   3. High grade AV block post cardioversion No AV nodal agents.   F/u Afib clinic prn. Follow up as scheduled with Dr. Nelly Choi for [facility] evaluation.    [name], PA-C Afib Clinic [facility] [address] [place], [place] [address] [phone] 11/20/2022 3:21 PM

## 2022-11-20 NOTE — Telephone Encounter (Signed)
This patient has been identified as "high risk" and in the top 25% of risk stratification for [facility] accountable patients.  These patients were identified using a number of factors including # of hospitalizations, ED visits, HF exacerbations, elevated BP and A1c, and overall cost of care.   Referral placed for cosign by the PCP.  CMCS team to schedule once cosigned.  Ruthellen Tippy, PharmD, [name] Clinical Pharmacist Practitioner [facility] [phone]  

## 2022-12-02 ENCOUNTER — Telehealth (HOSPITAL_COMMUNITY): Payer: Self-pay | Admitting: *Deleted

## 2022-12-02 NOTE — Telephone Encounter (Signed)
After reviewing the chart with Dr. Flora Lipps, Dr. Flora Lipps states that his cardiac PET should be canceled due to his bradycardia and heart block. We reached out to Dr. Royann Shivers and he states that patient can be delayed until after seeing Dr. [name] and he gets better or gets a pacemaker.  Larey Brick RN Navigator Cardiac Imaging [facility] [phone] Office [phone] Cell

## 2022-12-02 NOTE — Telephone Encounter (Signed)
Reaching out to patient to offer assistance regarding upcoming cardiac imaging study; pt verbalizes understanding of appt date/time, parking situation and where to check in, pre-test NPO status and verified current allergies; name and call back number provided for further questions should they arise  Abdulai Blaylock RN Navigator Cardiac Imaging [facility] [phone] office [phone] cell  Patient aware to avoid caffeine 12 hours prior to his cardiac PET scan. 

## 2022-12-04 ENCOUNTER — Encounter (HOSPITAL_COMMUNITY): Admission: RE | Admit: 2022-12-04 | Payer: Medicare Other | Source: Ambulatory Visit

## 2022-12-04 ENCOUNTER — Other Ambulatory Visit (HOSPITAL_COMMUNITY): Payer: Medicare Other

## 2022-12-05 ENCOUNTER — Other Ambulatory Visit: Payer: Medicare Other

## 2022-12-05 DIAGNOSIS — E559 Vitamin D deficiency, unspecified: Secondary | ICD-10-CM

## 2022-12-05 DIAGNOSIS — I4891 Unspecified atrial fibrillation: Secondary | ICD-10-CM | POA: Diagnosis not present

## 2022-12-05 DIAGNOSIS — E785 Hyperlipidemia, unspecified: Secondary | ICD-10-CM

## 2022-12-05 DIAGNOSIS — E039 Hypothyroidism, unspecified: Secondary | ICD-10-CM | POA: Diagnosis not present

## 2022-12-05 DIAGNOSIS — G629 Polyneuropathy, unspecified: Secondary | ICD-10-CM

## 2022-12-05 DIAGNOSIS — G4710 Hypersomnia, unspecified: Secondary | ICD-10-CM

## 2022-12-05 LAB — COMPLETE METABOLIC PANEL WITH GFR
AG Ratio: 1.6 (calc) (ref 1.0–2.5)
Alkaline phosphatase (APISO): 79 U/L (ref 35–144)
BUN: 16 mg/dL (ref 7–25)
Chloride: 103 mmol/L (ref 98–110)
Creat: 1 mg/dL (ref 0.70–1.28)
Globulin: 2.8 g/dL (calc) (ref 1.9–3.7)
Potassium: 4.8 mmol/L (ref 3.5–5.3)

## 2022-12-05 LAB — LIPID PANEL
Cholesterol: 129 mg/dL (ref <200)
HDL: 53 mg/dL (ref >=40)
Non-HDL Cholesterol (Calc): 76 mg/dL (calc) (ref <130)
Total CHOL/HDL Ratio: 2.4 (calc) (ref <5.0)

## 2022-12-05 LAB — CBC WITH DIFFERENTIAL/PLATELET
Lymphs Abs: 1565 cells/uL (ref 850–3900)
MCV: 93.5 fL (ref 80.0–100.0)
MPV: 13.2 fL — ABNORMAL HIGH (ref 7.5–12.5)
Neutrophils Relative %: 54.3 %
Total Lymphocyte: 32.6 %

## 2022-12-05 MED ORDER — ATORVASTATIN CALCIUM 10 MG PO TABS
10.0000 mg | ORAL_TABLET | Freq: Every day | ORAL | 1 refills | Status: DC
Start: 2022-12-05 — End: 2022-12-20

## 2022-12-06 ENCOUNTER — Other Ambulatory Visit: Payer: Medicare Other

## 2022-12-06 ENCOUNTER — Ambulatory Visit: Payer: Medicare Other | Attending: Cardiovascular Disease | Admitting: Cardiovascular Disease

## 2022-12-06 ENCOUNTER — Encounter: Payer: Self-pay | Admitting: Cardiovascular Disease

## 2022-12-06 VITALS — BP 138/74 | HR 65 | Ht 76.0 in | Wt 180.4 lb

## 2022-12-06 DIAGNOSIS — I4891 Unspecified atrial fibrillation: Secondary | ICD-10-CM

## 2022-12-06 LAB — COMPLETE METABOLIC PANEL WITH GFR
ALT: 16 U/L (ref 9–46)
AST: 20 U/L (ref 10–35)
Albumin: 4.5 g/dL (ref 3.6–5.1)
CO2: 28 mmol/L (ref 20–32)
Calcium: 9.6 mg/dL (ref 8.6–10.3)
Glucose, Bld: 96 mg/dL (ref 65–99)
Sodium: 140 mmol/L (ref 135–146)
Total Bilirubin: 1.9 mg/dL — ABNORMAL HIGH (ref 0.2–1.2)
Total Protein: 7.3 g/dL (ref 6.1–8.1)
eGFR: 78 mL/min/{1.73_m2} (ref >=60)

## 2022-12-06 LAB — CBC WITH DIFFERENTIAL/PLATELET
Absolute Monocytes: 494 cells/uL (ref 200–950)
Basophils Absolute: 72 cells/uL (ref 0–200)
Basophils Relative: 1.5 %
Eosinophils Absolute: 62 cells/uL (ref 15–500)
Eosinophils Relative: 1.3 %
HCT: 50.6 % — ABNORMAL HIGH (ref 38.5–50.0)
Hemoglobin: 16.9 g/dL (ref 13.2–17.1)
MCH: 31.2 pg (ref 27.0–33.0)
MCHC: 33.4 g/dL (ref 32.0–36.0)
Monocytes Relative: 10.3 %
Neutro Abs: 2606 cells/uL (ref 1500–7800)
Platelets: 142 10*3/uL (ref 140–400)
RBC: 5.41 10*6/uL (ref 4.20–5.80)
RDW: 12.8 % (ref 11.0–15.0)
WBC: 4.8 10*3/uL (ref 3.8–10.8)

## 2022-12-06 LAB — VITAMIN D 25 HYDROXY (VIT D DEFICIENCY, FRACTURES): Vit D, 25-Hydroxy: 35 ng/mL (ref 30–100)

## 2022-12-06 LAB — LIPID PANEL
LDL Cholesterol (Calc): 58 mg/dL (calc)
Triglycerides: 96 mg/dL (ref <150)

## 2022-12-06 LAB — TSH: TSH: 2.06 mIU/L (ref 0.40–4.50)

## 2022-12-06 NOTE — Addendum Note (Signed)
Addended by: Sherle Poe R on: 12/06/2022 10:44 AM   Modules accepted: Orders

## 2022-12-06 NOTE — Patient Instructions (Signed)
Medication Instructions:  Your physician recommends that you continue on your current medications as directed. Please refer to the Current Medication list given to you today. *If you need a refill on your cardiac medications before your next appointment, please call your pharmacy*   Lab Work: CBC and BMET Today If you have labs (blood work) drawn today and your tests are completely normal, you will receive your results only by: MyChart Message (if you have MyChart) OR A paper copy in the mail If you have any lab test that is abnormal or we need to change your treatment, we will call you to review the results.   Testing/Procedures: Pacemaker Implant - Thursday, July 11 Your physician has recommended that you have a pacemaker inserted. A pacemaker is a small device that is placed under the skin of your chest or abdomen to help control abnormal heart rhythms. This device uses electrical pulses to prompt the heart to beat at a normal rate. Pacemakers are used to treat heart rhythms that are too slow. Wire (leads) are attached to the pacemaker that goes into the chambers of you heart. This is done in the hospital and usually requires and overnight stay. Please see the instruction sheet given to you today for more information.   Follow-Up: At [facility], you and your health needs are our priority.  As part of our continuing mission to provide you with exceptional heart care, we have created designated Provider Care Teams.  These Care Teams include your primary Cardiologist (physician) and Advanced Practice Providers (APPs -  Physician Assistants and Nurse Practitioners) who all work together to provide you with the care you need, when you need it.  We recommend signing up for the patient portal called "MyChart".  Sign up information is provided on this After Visit Summary.  MyChart is used to connect with patients for Virtual Visits (Telemedicine).  Patients are able to view lab/test  results, encounter notes, upcoming appointments, etc.  Non-urgent messages can be sent to your provider as well.   To learn more about what you can do with MyChart, go to [email or web address].    Your next appointment:   We will schedule follow up after device placement   Provider:   York Pellant, MD

## 2022-12-06 NOTE — Progress Notes (Signed)
Electrophysiology Office Note:    Date:  12/06/2022   ID:  Carlos Choi, DOB [birth year], MRN [id number]  PCP:  Carlos Brooks, MD   [facility] Providers Cardiologist:  None     Referring MD: Carlos Brooks, MD   History of Present Illness:    Carlos Choi is a 77 y.o. male with a medical history significant for smoking, bronchiectasis, hypothyroidism, and atrial flutter referred for bradycardia.     He was diagnosed with atrial flutter incidentally at the time of maxillary antrostomy on 10/16/2022.  He had slow ventricular rates, 35 to 70 bpm.  He was unaware of his arrhythmia.   Eliquis started and he underwent DC cardioversion.   ECG from May 30 shows Mobitz 1 AV block. He has been having significant fatigue. He is able to walk a mile and a half at a slow pace with his wife every morning. He has noticed intermittent ankle swelling over the past 1 months.     Today, he reports that he is at his baseline. He continues to have shortness of breath and fatigue with exertion.   EKGs/Labs/Other Studies Reviewed Today:    Echocardiogram:  TTE 11/15/2022 EF 45-50%. Moderately dilated LA, severely dilated RA   Monitors:   Stress testing:   Advanced imaging:   Cardiac catherization   EKG:   EKG Interpretation  Date/Time:  Friday December 06 2022 09:38:37 EDT Ventricular Rate:  65 PR Interval:    QRS Duration: 100 QT Interval:  430 QTC Calculation: 447 R Axis:   67 Text Interpretation: Sinus rhythm, junctional rhythm Frequent PVCs Left ventricular hypertrophy with repolarization abnormality ( Sokolow-Lyon ) When compared with ECG of 20-Nov-2022 14:35, Current undetermined rhythm precludes rhythm comparison, needs review Confirmed by York Pellant [phone]) on 12/06/2022 10:04:13 AM     Physical Exam:    VS:  BP 138/74   Pulse 65   Ht 6\' 4"  (1.93 m)   Wt 180 lb 6.4 oz (81.8 kg)   SpO2 98%   BMI 21.96 kg/m     Wt Readings from Last 3  Encounters:  12/06/22 180 lb 6.4 oz (81.8 kg)  11/20/22 183 lb 9.6 oz (83.3 kg)  11/14/22 187 lb (84.8 kg)     GEN:  Well nourished, well developed in no acute distress CARDIAC: irregular rhythm, no murmurs, rubs, gallops RESPIRATORY:  Normal work of breathing MUSCULOSKELETAL: no edema    ASSESSMENT & PLAN:    Second degree AV block, complete heart block Symptomatic with fatigue. ECG today is most consistent with high grade AV block; PVCs are somewhat confounding. Prior ECG has shown Mobitz I I advised him not to drive, perform tasks that place him at increased risk if her were to become lightheaded I recommended dual chamber pacemaker; he would like to proceed  I discussed the indication for the procedure and the logistics, risks, potential benefit, and after care. I specifically explained that risks include but are not limited to infection, bleeding,damage to blood vessels, lung, and the heart -- but risk of prolonged hospitalization, need for surgery, or the event of stroke, heart attack, or death are low but not zero.   Atrial flutter Typical atrial flutter on ECG 11/07/2022 Will monitor for reoccurrence after device placement RA is severely dilated Will likely need ablation in the future  Frequent PVCs May be contributing to fatigue and cardiomyopathy Will begin medical therapy after pacemaker is in place  Secondary hypercoagulable state Continue apixaban 5 mg  Cardiomyopathy EF 45% PET is scheduled Will start betablocker after pacemaker is in place    Signed, Maurice Small, MD  12/06/2022 10:31 AM    [facility]

## 2022-12-09 ENCOUNTER — Encounter: Payer: Medicare Other | Admitting: Family Medicine

## 2022-12-16 ENCOUNTER — Telehealth: Payer: Self-pay | Admitting: Cardiovascular Disease

## 2022-12-16 DIAGNOSIS — N401 Enlarged prostate with lower urinary tract symptoms: Secondary | ICD-10-CM | POA: Diagnosis not present

## 2022-12-16 DIAGNOSIS — R351 Nocturia: Secondary | ICD-10-CM | POA: Diagnosis not present

## 2022-12-16 NOTE — Telephone Encounter (Signed)
Pt c/o medication issue:  1. Name of Medication: apixaban (ELIQUIS) 5 MG TABS tablet   2. How are you currently taking this medication (dosage and times per day)?    3. Are you having a reaction (difficulty breathing--STAT)? no  4. What is your medication issue? Patient forgot to take his medication last night. Calling to see if he needs to double up on the meds this evening. Please advise

## 2022-12-16 NOTE — Telephone Encounter (Signed)
Spoke with Darl Pikes, wife (DPR on file) - patient had forgot to take eliquis dose last night and wasn't sure if he was to take an additional dose this morning. Instructed patient/wife to continue as prescribed, do not try to take "make up doses" at this point. No further questions

## 2022-12-20 ENCOUNTER — Ambulatory Visit (INDEPENDENT_AMBULATORY_CARE_PROVIDER_SITE_OTHER): Payer: Medicare Other | Admitting: Family Medicine

## 2022-12-20 ENCOUNTER — Encounter: Payer: Self-pay | Admitting: Family Medicine

## 2022-12-20 VITALS — BP 120/86 | HR 58 | Temp 97.7°F | Ht 76.0 in | Wt 182.0 lb

## 2022-12-20 DIAGNOSIS — I4892 Unspecified atrial flutter: Secondary | ICD-10-CM

## 2022-12-20 DIAGNOSIS — E785 Hyperlipidemia, unspecified: Secondary | ICD-10-CM | POA: Diagnosis not present

## 2022-12-20 DIAGNOSIS — E039 Hypothyroidism, unspecified: Secondary | ICD-10-CM | POA: Diagnosis not present

## 2022-12-20 DIAGNOSIS — Z0001 Encounter for general adult medical examination with abnormal findings: Secondary | ICD-10-CM | POA: Diagnosis not present

## 2022-12-20 DIAGNOSIS — Z0000 Encounter for general adult medical examination without abnormal findings: Secondary | ICD-10-CM

## 2022-12-20 DIAGNOSIS — J479 Bronchiectasis, uncomplicated: Secondary | ICD-10-CM

## 2022-12-20 DIAGNOSIS — K589 Irritable bowel syndrome without diarrhea: Secondary | ICD-10-CM

## 2022-12-20 MED ORDER — ALPRAZOLAM 0.25 MG PO TABS
0.2500 mg | ORAL_TABLET | Freq: Every day | ORAL | 0 refills | Status: DC | PRN
Start: 2022-12-20 — End: 2023-11-25

## 2022-12-20 MED ORDER — LEVOTHYROXINE SODIUM 100 MCG PO TABS
100.0000 ug | ORAL_TABLET | Freq: Every day | ORAL | 3 refills | Status: DC
Start: 2022-12-20 — End: 2023-12-15

## 2022-12-20 MED ORDER — ATORVASTATIN CALCIUM 10 MG PO TABS: 10.0000 mg | ORAL_TABLET | Freq: Every day | ORAL | 3 refills | Status: DC

## 2022-12-20 NOTE — Progress Notes (Signed)
Subjective:    Patient ID: Carlos Choi, male    DOB: Apr 01, 1946, 77 y.o.   MRN: [id number]  HPI  Patient is a very pleasant 77 year old Caucasian gentleman here today for complete physical exam.  He denies any falls or depression or memory loss.  Since I last saw the patient, he was found to have a mucus impaction in his maxillary sinus.  He was scheduled for surgery in May however he was found to be in atrial flutter preventing the surgery.  He was referred to cardiology who performed a DC cardioversion at the end of May.  He had an echocardiogram May 31 which revealed an ejection fraction of 45 to 50%.  Started on losartan for this.  Has been scheduled for a pacemaker due to bradycardia.  He does report feeling tired but outside of that, he is doing well.  Immunizations are up-to-date except for flu shot in the fall and RSV vaccine.  Colonoscopy is not due until 2027.  Urology is performing his prostate exam Immunization History  Administered Date(s) Administered   COVID-19, mRNA, vaccine(Comirnaty)12 years and older 04/16/2022   Fluad Quad(high Dose 65+) 03/16/2019   Influenza, High Dose Seasonal PF 04/02/2017, 03/13/2020, 04/09/2021   Influenza,inj,Quad PF,6+ Mos 04/06/2013, 04/06/2014, 04/07/2015, 03/29/2016, 03/10/2018   Influenza-Unspecified 03/17/2012, 04/04/2022   PFIZER(Purple Top)SARS-COV-2 Vaccination 07/07/2019, 07/28/2019, 03/31/2020, 12/11/2020   Pfizer Covid-19 Vaccine Bivalent Booster 97yrs & up 04/09/2021   Pneumococcal Conjugate-13 04/02/2017   Pneumococcal Polysaccharide-23 01/23/2016   Tdap 06/17/2009   Zoster Recombinant(Shingrix) 04/02/2017, 06/02/2017   Lab on 12/05/2022  Component Date Value Ref Range Status   WBC 12/05/2022 4.8  3.8 - 10.8 Thousand/uL Final   RBC 12/05/2022 5.41  4.20 - 5.80 Million/uL Final   Hemoglobin 12/05/2022 16.9  13.2 - 17.1 g/dL Final   HCT 40/98/1191 50.6 (H)  38.5 - 50.0 % Final   MCV 12/05/2022 93.5  80.0 - 100.0 fL Final   MCH  12/05/2022 31.2  27.0 - 33.0 pg Final   MCHC 12/05/2022 33.4  32.0 - 36.0 g/dL Final   RDW 47/82/9562 12.8  11.0 - 15.0 % Final   Platelets 12/05/2022 142  140 - 400 Thousand/uL Final   MPV 12/05/2022 13.2 (H)  7.5 - 12.5 fL Final   Neutro Abs 12/05/2022 2,606  1,500 - 7,800 cells/uL Final   Lymphs Abs 12/05/2022 1,565  850 - 3,900 cells/uL Final   Absolute Monocytes 12/05/2022 494  200 - 950 cells/uL Final   Eosinophils Absolute 12/05/2022 62  15 - 500 cells/uL Final   Basophils Absolute 12/05/2022 72  0 - 200 cells/uL Final   Neutrophils Relative % 12/05/2022 54.3  % Final   Total Lymphocyte 12/05/2022 32.6  % Final   Monocytes Relative 12/05/2022 10.3  % Final   Eosinophils Relative 12/05/2022 1.3  % Final   Basophils Relative 12/05/2022 1.5  % Final   Glucose, Bld 12/05/2022 96  65 - 99 mg/dL Final   Comment: .            Fasting reference interval .    BUN 12/05/2022 16  7 - 25 mg/dL Final   Creat 13/01/6577 1.00  0.70 - 1.28 mg/dL Final   eGFR 46/96/2952 78  > OR = 60 mL/min/1.68m2 Final   BUN/Creatinine Ratio 12/05/2022 SEE NOTE:  6 - 22 (calc) Final   Comment:    Not Reported: BUN and Creatinine are within    reference range. .    Sodium 12/05/2022 140  135 - 146 mmol/L Final   Potassium 12/05/2022 4.8  3.5 - 5.3 mmol/L Final   Chloride 12/05/2022 103  98 - 110 mmol/L Final   CO2 12/05/2022 28  20 - 32 mmol/L Final   Calcium 12/05/2022 9.6  8.6 - 10.3 mg/dL Final   Total Protein 40/98/1191 7.3  6.1 - 8.1 g/dL Final   Albumin 47/82/9562 4.5  3.6 - 5.1 g/dL Final   Globulin 13/01/6577 2.8  1.9 - 3.7 g/dL (calc) Final   AG Ratio 12/05/2022 1.6  1.0 - 2.5 (calc) Final   Total Bilirubin 12/05/2022 1.9 (H)  0.2 - 1.2 mg/dL Final   Alkaline phosphatase (APISO) 12/05/2022 79  35 - 144 U/L Final   AST 12/05/2022 20  10 - 35 U/L Final   ALT 12/05/2022 16  9 - 46 U/L Final   Cholesterol 12/05/2022 129  <200 mg/dL Final   HDL 46/96/2952 53  > OR = 40 mg/dL Final   Triglycerides  12/05/2022 96  <150 mg/dL Final   LDL Cholesterol (Calc) 12/05/2022 58  mg/dL (calc) Final   Comment: Reference range: <100 . Desirable range <100 mg/dL for primary prevention;   <70 mg/dL for patients with CHD or diabetic patients  with > or = 2 CHD risk factors. [name] LDL-C is now calculated using the Martin-Hopkins  calculation, which is a validated novel method providing  better accuracy than the Friedewald equation in the  estimation of LDL-C.  Horald Pollen et al. Lenox Ahr. 8413;244(01): 2061-2068  (http://education.[facility].com/faq/FAQ164)    Total CHOL/HDL Ratio 12/05/2022 2.4  <0.2 (calc) Final   Non-HDL Cholesterol (Calc) 12/05/2022 76  <130 mg/dL (calc) Final   Comment: For patients with diabetes plus 1 major ASCVD risk  factor, treating to a non-HDL-C goal of <100 mg/dL  (LDL-C of <72 mg/dL) is considered a therapeutic  option.    Vit D, 25-Hydroxy 12/05/2022 35  30 - 100 ng/mL Final   Comment: Vitamin D Status         25-OH Vitamin D: . Deficiency:                    <20 ng/mL Insufficiency:             20 - 29 ng/mL Optimal:                 > or = 30 ng/mL . For 25-OH Vitamin D testing on patients on  D2-supplementation and patients for whom quantitation  of D2 and D3 fractions is required, the [facility](TM) 25-OH VIT D, (D2,D3), LC/MS/MS is recommended: order  code 53664 (patients >79yrs). . See Note 1 . Note 1 . For additional information, please refer to  http://education.[facility].com/faq/FAQ199  (This link is being provided for informational/ educational purposes only.)    TSH 12/05/2022 2.06  0.40 - 4.50 mIU/L Final     Past Medical History:  Diagnosis Date   Allergy    Anxiety    Arthritis    mild ankles    Cataract    removed left eye, forming right eye    Diverticulosis    Hemorrhoids    Hypothyroid    Kidney stones    Rectal bleeding    due to hemorrhoid    Tubular adenoma of colon 2015   Past Surgical History:  Procedure  Laterality Date   CARDIOVERSION N/A 11/14/2022   Procedure: CARDIOVERSION;  Surgeon: Jake Bathe, MD;  Location: [facility];  Service: Cardiovascular;  Laterality: N/A;  CATARACT EXTRACTION Left    COLONOSCOPY     HERNIA REPAIR Bilateral    Over ten years ago per patient. He was not sure of the date.   POLYPECTOMY     ROTATOR CUFF REPAIR     both shoulders - patient does not remember the exact date   Current Outpatient Medications on File Prior to Visit  Medication Sig Dispense Refill   acetaminophen (TYLENOL) 650 MG CR tablet Take 1,300 mg by mouth 2 (two) times a week.     ALPRAZolam (XANAX) 0.25 MG tablet Take 1 tablet (0.25 mg total) by mouth daily as needed for anxiety. 90 tablet 0   apixaban (ELIQUIS) 5 MG TABS tablet Take 1 tablet (5 mg total) by mouth 2 (two) times daily. 60 tablet 3   atorvastatin (LIPITOR) 10 MG tablet Take 1 tablet (10 mg total) by mouth daily. 30 tablet 1   Guaifenesin (MUCINEX MAXIMUM STRENGTH) 1200 MG TB12 Take 1,200 mg by mouth daily as needed (Congestion).     hyoscyamine (LEVSIN) 0.125 MG tablet Take 1 tablet (0.125 mg total) by mouth every 6 (six) hours as needed for cramping (diarrhea). 30 tablet 0   ipratropium (ATROVENT) 0.03 % nasal spray Place 2 sprays into both nostrils daily as needed for rhinitis.     Lactobacillus-Inulin (CULTURELLE DIGESTIVE HEALTH PO) Take 1 capsule by mouth daily.     levothyroxine (SYNTHROID) 100 MCG tablet Take 1 tablet (100 mcg total) by mouth daily. 90 tablet 3   loratadine (CLARITIN) 10 MG tablet Take 10 mg by mouth daily.     MOMETASONE FURO & DIMETHICONE EX Place 2 drops in ear(s) daily as needed (Itching /fluid).     Multiple Vitamins-Minerals (CENTRUM SILVER PO) Take 1 tablet by mouth daily.     Omega-3 Fatty Acids (FISH OIL) 1000 MG CAPS Take 2,000 mg by mouth daily.     PSYLLIUM PO Take 1 packet by mouth daily as needed (Regulate bowel movement). Taking 1 tsp daily in water     silodosin (RAPAFLO) 8 MG  CAPS capsule Take 8 mg by mouth daily.     Sod Bicarb-K Bicarb-Citric Acd (ALKA-SELTZER GOLD) [id number] MG TBEF Take 2 tablets by mouth daily as needed (Upset stomach).     losartan (COZAAR) 25 MG tablet Take 1 tablet (25 mg total) by mouth daily. 30 tablet 1   No current facility-administered medications on file prior to visit.   No Known Allergies Social History   Socioeconomic History   Marital status: Married    Spouse name: [name].   Number of children: 2   Years of education: Not on file   Highest education level: Not on file  Occupational History   Not on file  Tobacco Use   Smoking status: Former    Packs/day: 0.50    Years: 6.00    Additional pack years: 0.00    Total pack years: 3.00    Types: Cigarettes    Quit date: 06/18/1979    Years since quitting: 43.5   Smokeless tobacco: Never  Vaping Use   Vaping Use: Never used  Substance and Sexual Activity   Alcohol use: Yes    Comment: 1-2 beers Monthly.   Drug use: No   Sexual activity: Not on file  Other Topics Concern   Not on file  Social History Narrative   Not on file   Social Determinants of Health   Financial Resource Strain: Low Risk  (10/27/2020)   Overall Financial Resource  Strain (CARDIA)    Difficulty of Paying Living Expenses: Not hard at all  Food Insecurity: No Food Insecurity (11/15/2022)   Hunger Vital Sign    Worried About Running Out of Food in the Last Year: Never true    Ran Out of Food in the Last Year: Never true  Transportation Needs: No Transportation Needs (11/15/2022)   PRAPARE - Administrator, Civil Service (Medical): No    Lack of Transportation (Non-Medical): No  Physical Activity: Sufficiently Active (10/27/2020)   Exercise Vital Sign    Days of Exercise per Week: 7 days    Minutes of Exercise per Session: 30 min  Stress: No Stress Concern Present (10/27/2020)   [employer] of Occupational Health - Occupational Stress Questionnaire    Feeling of Stress :  Not at all  Social Connections: Moderately Integrated (10/27/2020)   Social Connection and Isolation Panel [NHANES]    Frequency of Communication with Friends and Family: More than three times a week    Frequency of Social Gatherings with Friends and Family: Three times a week    Attends Religious Services: More than 4 times per year    Active Member of Clubs or Organizations: No    Attends Banker Meetings: Never    Marital Status: Married  Catering manager Violence: Not At Risk (11/15/2022)   Humiliation, Afraid, Rape, and Kick questionnaire    Fear of Current or Ex-Partner: No    Emotionally Abused: No    Physically Abused: No    Sexually Abused: No   Family History  Problem Relation Age of Onset   Aneurysm Mother    Alzheimer's disease Father    Cancer Sister        ovarian   Stroke Brother    Hypertension Brother    Colon cancer Neg Hx    Esophageal cancer Neg Hx    Stomach cancer Neg Hx    Rectal cancer Neg Hx    Colon polyps Neg Hx      Review of Systems     Objective:   Physical Exam Vitals reviewed.  Constitutional:      General: He is not in acute distress.    Appearance: Normal appearance. He is normal weight. He is not ill-appearing, toxic-appearing or diaphoretic.  HENT:     Head: Normocephalic and atraumatic.     Right Ear: Tympanic membrane, ear canal and external ear normal. There is no impacted cerumen.     Left Ear: Tympanic membrane, ear canal and external ear normal. There is no impacted cerumen.     Nose: Nose normal. No congestion or rhinorrhea.     Mouth/Throat:     Mouth: Mucous membranes are moist.     Pharynx: Oropharynx is clear. No oropharyngeal exudate or posterior oropharyngeal erythema.  Eyes:     General: No scleral icterus.       Right eye: No discharge.        Left eye: No discharge.     Extraocular Movements: Extraocular movements intact.     Conjunctiva/sclera: Conjunctivae normal.     Pupils: Pupils are equal,  round, and reactive to light.  Neck:     Vascular: No carotid bruit.  Cardiovascular:     Rate and Rhythm: Regular rhythm. Bradycardia present.     Pulses: Normal pulses.     Heart sounds: Normal heart sounds. No murmur heard.    No friction rub. No gallop.  Pulmonary:     Effort:  Pulmonary effort is normal. No respiratory distress.     Breath sounds: Normal breath sounds. No wheezing, rhonchi or rales.  Abdominal:     General: Abdomen is flat. Bowel sounds are normal. There is no distension.     Palpations: Abdomen is soft. There is no mass.     Tenderness: There is no abdominal tenderness. There is no guarding or rebound.     Hernia: No hernia is present.  Musculoskeletal:        General: No swelling, tenderness or deformity.     Cervical back: Normal range of motion and neck supple. No rigidity or tenderness.     Right lower leg: No edema.     Left lower leg: No edema.  Lymphadenopathy:     Cervical: No cervical adenopathy.  Skin:    General: Skin is warm and dry.     Coloration: Skin is not jaundiced or pale.     Findings: No bruising, erythema, lesion or rash.  Neurological:     General: No focal deficit present.     Mental Status: He is alert and oriented to person, place, and time. Mental status is at baseline.     Cranial Nerves: No cranial nerve deficit.     Sensory: No sensory deficit.     Motor: No weakness.     Coordination: Coordination normal.     Gait: Gait normal.     Deep Tendon Reflexes: Reflexes normal.  Psychiatric:        Mood and Affect: Mood normal.        Behavior: Behavior normal.        Thought Content: Thought content normal.        Judgment: Judgment normal.    Patient has bradycardia today with frequent PVCs       Assessment & Plan:  Bronchiectasis without complication (HCC)  Hyperlipidemia with target LDL less than 100 - Plan: atorvastatin (LIPITOR) 10 MG tablet  Hypothyroidism, unspecified type - Plan: levothyroxine (SYNTHROID) 100  MCG tablet  Irritable bowel syndrome, unspecified type - Plan: ALPRAZolam (XANAX) 0.25 MG tablet  Atrial flutter, unspecified type [facility])  General medical exam Physical exam today is normal.  Patient is scheduled for pacemaker placement next week.  Cholesterol is outstanding.  The remainder of his lab work is excellent.  TSH is within normal limits.  Cholesterol is outstanding.  Recommended a flu shot in the fall, and RSV shot.  The remainder of his cancer screening is up-to-date.  Pulmonology is monitoring his bronchiectasis.  Urology is monitoring his prostate.

## 2022-12-26 ENCOUNTER — Other Ambulatory Visit: Payer: Self-pay

## 2022-12-26 ENCOUNTER — Encounter (HOSPITAL_COMMUNITY): Payer: Self-pay | Admitting: Cardiovascular Disease

## 2022-12-26 ENCOUNTER — Encounter (HOSPITAL_COMMUNITY)
Admission: RE | Disposition: A | Discharge status: Home / Self Care | Payer: Self-pay | Source: Ambulatory Visit | Attending: Cardiovascular Disease

## 2022-12-26 ENCOUNTER — Ambulatory Visit (HOSPITAL_COMMUNITY)
Admission: RE | Admit: 2022-12-26 | Discharge: 2022-12-27 | Disposition: A | Discharge status: Home / Self Care | Payer: Medicare Other | Source: Ambulatory Visit | Attending: Cardiovascular Disease | Admitting: Cardiovascular Disease

## 2022-12-26 ENCOUNTER — Other Ambulatory Visit: Payer: Self-pay | Admitting: Cardiology

## 2022-12-26 DIAGNOSIS — E039 Hypothyroidism, unspecified: Secondary | ICD-10-CM | POA: Insufficient documentation

## 2022-12-26 DIAGNOSIS — I442 Atrioventricular block, complete: Secondary | ICD-10-CM | POA: Diagnosis not present

## 2022-12-26 DIAGNOSIS — I429 Cardiomyopathy, unspecified: Secondary | ICD-10-CM | POA: Diagnosis not present

## 2022-12-26 DIAGNOSIS — I441 Atrioventricular block, second degree: Secondary | ICD-10-CM

## 2022-12-26 DIAGNOSIS — I483 Typical atrial flutter: Secondary | ICD-10-CM | POA: Insufficient documentation

## 2022-12-26 DIAGNOSIS — Z7901 Long term (current) use of anticoagulants: Secondary | ICD-10-CM | POA: Diagnosis not present

## 2022-12-26 DIAGNOSIS — R5383 Other fatigue: Secondary | ICD-10-CM | POA: Diagnosis not present

## 2022-12-26 DIAGNOSIS — J479 Bronchiectasis, uncomplicated: Secondary | ICD-10-CM | POA: Insufficient documentation

## 2022-12-26 DIAGNOSIS — Z950 Presence of cardiac pacemaker: Secondary | ICD-10-CM | POA: Diagnosis present

## 2022-12-26 DIAGNOSIS — D6869 Other thrombophilia: Secondary | ICD-10-CM | POA: Insufficient documentation

## 2022-12-26 HISTORY — PX: PACEMAKER IMPLANT: EP1218

## 2022-12-26 HISTORY — DX: Presence of cardiac pacemaker: Z95.0

## 2022-12-26 SURGERY — PACEMAKER IMPLANT

## 2022-12-26 MED ORDER — CEFAZOLIN SODIUM-DEXTROSE 2-4 GM/100ML-% IV SOLN
INTRAVENOUS | Status: AC
  Administered 2022-12-26: 2 g via INTRAVENOUS
  Filled 2022-12-26: qty 100

## 2022-12-26 MED ORDER — LIDOCAINE HCL (PF) 1 % IJ SOLN
INTRAMUSCULAR | Status: AC
  Filled 2022-12-26: qty 60

## 2022-12-26 MED ORDER — MIDAZOLAM HCL 5 MG/5ML IJ SOLN
INTRAMUSCULAR | Status: DC | PRN
  Administered 2022-12-26 (×5): 1 mg via INTRAVENOUS

## 2022-12-26 MED ORDER — ACETAMINOPHEN 325 MG PO TABS
325.0000 mg | ORAL_TABLET | ORAL | Status: DC | PRN
  Administered 2022-12-27: 650 mg via ORAL
  Filled 2022-12-26: qty 2

## 2022-12-26 MED ORDER — POVIDONE-IODINE 10 % EX SWAB
2.0000 | Freq: Once | CUTANEOUS | Status: AC
  Administered 2022-12-26: 2 via TOPICAL

## 2022-12-26 MED ORDER — SODIUM CHLORIDE 0.9 % IV SOLN
80.0000 mg | INTRAVENOUS | Status: AC
  Administered 2022-12-26: 80 mg

## 2022-12-26 MED ORDER — MIDAZOLAM HCL 5 MG/5ML IJ SOLN
INTRAMUSCULAR | Status: AC
  Filled 2022-12-26: qty 5

## 2022-12-26 MED ORDER — YOU HAVE A PACEMAKER BOOK
Freq: Once | Status: AC
  Filled 2022-12-26: qty 1

## 2022-12-26 MED ORDER — CEFAZOLIN SODIUM-DEXTROSE 1-4 GM/50ML-% IV SOLN
1.0000 g | Freq: Four times a day (QID) | INTRAVENOUS | Status: AC
  Administered 2022-12-26 – 2022-12-27 (×3): 1 g via INTRAVENOUS
  Filled 2022-12-26 (×3): qty 50

## 2022-12-26 MED ORDER — SODIUM CHLORIDE 0.9 % IV SOLN: INTRAVENOUS | Status: DC

## 2022-12-26 MED ORDER — ALPRAZOLAM 0.25 MG PO TABS
0.2500 mg | ORAL_TABLET | Freq: Every day | ORAL | Status: DC | PRN
  Administered 2022-12-27: 0.25 mg via ORAL
  Filled 2022-12-26 (×2): qty 1

## 2022-12-26 MED ORDER — LEVOTHYROXINE SODIUM 100 MCG PO TABS
100.0000 ug | ORAL_TABLET | Freq: Every day | ORAL | Status: DC
  Administered 2022-12-27: 100 ug via ORAL
  Filled 2022-12-26: qty 1

## 2022-12-26 MED ORDER — CEFAZOLIN SODIUM-DEXTROSE 2-4 GM/100ML-% IV SOLN: 2.0000 g | INTRAVENOUS | Status: AC

## 2022-12-26 MED ORDER — SODIUM CHLORIDE 0.9 % IV SOLN
INTRAVENOUS | Status: AC
  Filled 2022-12-26: qty 2

## 2022-12-26 MED ORDER — CHLORHEXIDINE GLUCONATE 4 % EX SOLN: 4.0000 | Freq: Once | CUTANEOUS | Status: DC

## 2022-12-26 MED ORDER — FENTANYL CITRATE (PF) 100 MCG/2ML IJ SOLN
INTRAMUSCULAR | Status: DC | PRN
  Administered 2022-12-26 (×4): 25 ug via INTRAVENOUS

## 2022-12-26 MED ORDER — LOSARTAN POTASSIUM 25 MG PO TABS
25.0000 mg | ORAL_TABLET | Freq: Every day | ORAL | Status: DC
  Administered 2022-12-27: 25 mg via ORAL
  Filled 2022-12-26: qty 1

## 2022-12-26 MED ORDER — FENTANYL CITRATE (PF) 100 MCG/2ML IJ SOLN
INTRAMUSCULAR | Status: AC
  Filled 2022-12-26: qty 2

## 2022-12-26 MED ORDER — LIDOCAINE HCL 1 % IJ SOLN
INTRAMUSCULAR | Status: AC
  Filled 2022-12-26: qty 60

## 2022-12-26 MED ORDER — IOHEXOL 350 MG/ML SOLN
INTRAVENOUS | Status: DC | PRN
  Administered 2022-12-26: 12 mL
  Administered 2022-12-26 (×2): 15 mL
  Administered 2022-12-26: 10 mL

## 2022-12-26 MED ORDER — HYOSCYAMINE SULFATE 0.125 MG PO TABS: 0.1250 mg | ORAL_TABLET | Freq: Four times a day (QID) | ORAL | Status: DC | PRN

## 2022-12-26 MED ORDER — LIDOCAINE HCL (PF) 1 % IJ SOLN
INTRAMUSCULAR | Status: DC | PRN
  Administered 2022-12-26 (×2): 60 mL

## 2022-12-26 MED ORDER — ONDANSETRON HCL 4 MG/2ML IJ SOLN: 4.0000 mg | Freq: Four times a day (QID) | INTRAMUSCULAR | Status: DC | PRN

## 2022-12-26 MED ORDER — ATORVASTATIN CALCIUM 10 MG PO TABS
10.0000 mg | ORAL_TABLET | Freq: Every day | ORAL | Status: DC
  Administered 2022-12-26 – 2022-12-27 (×2): 10 mg via ORAL
  Filled 2022-12-26 (×3): qty 1

## 2022-12-26 SURGICAL SUPPLY — 15 items
CABLE SURGICAL S-101-97-12 (CABLE) ×1 IMPLANT
CATH CPS LOCATOR 3D MED (CATHETERS) IMPLANT
GUIDEWIRE VASC J-TIP .035X150 (WIRE) IMPLANT
HELIX LOCKING TOOL (MISCELLANEOUS) ×1
KIT MICROPUNCTURE NIT STIFF (SHEATH) IMPLANT
LEAD ULTIPACE 52 LPA1231/52 (Lead) IMPLANT
LEAD ULTIPACE 65 LPA1231/65 (Lead) IMPLANT
PACEMAKER ASSURITY DR-RF (Pacemaker) IMPLANT
PAD DEFIB RADIO PHYSIO CONN (PAD) ×1 IMPLANT
SHEATH 7FR PRELUDE SNAP 13 (SHEATH) IMPLANT
SHEATH 9FR PRELUDE SNAP 13 (SHEATH) IMPLANT
SLITTER AGILIS HISPRO (INSTRUMENTS) IMPLANT
TOOL HELIX LOCKING (MISCELLANEOUS) IMPLANT
TRAY PACEMAKER INSERTION (PACKS) ×1 IMPLANT
WIRE HI TORQ VERSACORE-J 145CM (WIRE) IMPLANT

## 2022-12-26 NOTE — H&P (Signed)
  Electrophysiology Office Note:    Date:  12/26/2022   ID:  Carlos Choi, DOB July 23, 1945, MRN [id number]  PCP:  [name], MD   [facility] Providers Cardiologist:  None     Referring MD: No ref. provider found   History of Present Illness:    Carlos Choi is a 77 y.o. male with a medical history significant for smoking, bronchiectasis, hypothyroidism, and atrial flutter referred for bradycardia.     He was diagnosed with atrial flutter incidentally at the time of maxillary antrostomy on 10/16/2022.  He had slow ventricular rates, 35 to 70 bpm.  He was unaware of his arrhythmia.   Eliquis started and he underwent DC cardioversion.   ECG from May 30 shows Mobitz 1 AV block. He has been having significant fatigue. He is able to walk a mile and a half at a slow pace with his wife every morning. He has noticed intermittent ankle swelling over the past 1 months.     Today, he reports that he is at his baseline. He continues to have shortness of breath and fatigue with exertion.  He has had no changes in symptoms, diagnoses, condition, or medications since his recent clinic visit.   EKGs/Labs/Other Studies Reviewed Today:    Echocardiogram:  TTE 11/15/2022 EF 45-50%. Moderately dilated LA, severely dilated RA   Monitors:   Stress testing:   Advanced imaging:   Cardiac catherization   EKG:         Physical Exam:    VS:  BP (!) 167/75   Pulse (!) 48   Temp 99.5 F (37.5 C) (Temporal)   Resp 18   Ht 6\' 4"  (1.93 m)   Wt 82.6 kg   SpO2 97%   BMI 22.15 kg/m     Wt Readings from Last 3 Encounters:  12/26/22 82.6 kg  12/20/22 82.6 kg  12/06/22 81.8 kg     GEN:  Well nourished, well developed in no acute distress CARDIAC: irregular rhythm, no murmurs, rubs, gallops RESPIRATORY:  Normal work of breathing MUSCULOSKELETAL: no edema    ASSESSMENT & PLAN:    Second degree AV block, complete heart block Symptomatic with fatigue. ECG  today is most consistent with high grade AV block; PVCs are somewhat confounding. Prior ECG has shown Mobitz I I advised him not to drive, perform tasks that place him at increased risk if her were to become lightheaded I recommended dual chamber pacemaker; he would like to proceed  I discussed the indication for the procedure and the logistics, risks, potential benefit, and after care. I specifically explained that risks include but are not limited to infection, bleeding,damage to blood vessels, lung, and the heart -- but risk of prolonged hospitalization, need for surgery, or the event of stroke, heart attack, or death are low but not zero.   Atrial flutter Typical atrial flutter on ECG 11/07/2022 Will monitor for reoccurrence after device placement RA is severely dilated Will likely need ablation in the future  Frequent PVCs May be contributing to fatigue and cardiomyopathy Will begin medical therapy after pacemaker is in place  Secondary hypercoagulable state Continue apixaban 5 mg  Cardiomyopathy EF 45% PET is scheduled Will start betablocker after pacemaker is in place    Signed, Maurice Small, MD  12/26/2022 2:49 PM    [facility]

## 2022-12-26 NOTE — Plan of Care (Signed)
  Problem: Coping: Goal: Level of anxiety will decrease Outcome: Progressing   Problem: Elimination: Goal: Will not experience complications related to urinary retention Outcome: Progressing   

## 2022-12-27 ENCOUNTER — Ambulatory Visit (HOSPITAL_COMMUNITY): Payer: Medicare Other

## 2022-12-27 ENCOUNTER — Encounter (HOSPITAL_COMMUNITY): Payer: Self-pay | Admitting: Cardiovascular Disease

## 2022-12-27 DIAGNOSIS — E039 Hypothyroidism, unspecified: Secondary | ICD-10-CM | POA: Diagnosis not present

## 2022-12-27 DIAGNOSIS — I442 Atrioventricular block, complete: Secondary | ICD-10-CM | POA: Diagnosis not present

## 2022-12-27 DIAGNOSIS — I429 Cardiomyopathy, unspecified: Secondary | ICD-10-CM | POA: Diagnosis not present

## 2022-12-27 DIAGNOSIS — R5383 Other fatigue: Secondary | ICD-10-CM | POA: Diagnosis not present

## 2022-12-27 DIAGNOSIS — Z7901 Long term (current) use of anticoagulants: Secondary | ICD-10-CM | POA: Diagnosis not present

## 2022-12-27 DIAGNOSIS — Z950 Presence of cardiac pacemaker: Secondary | ICD-10-CM | POA: Diagnosis not present

## 2022-12-27 DIAGNOSIS — D6869 Other thrombophilia: Secondary | ICD-10-CM | POA: Diagnosis not present

## 2022-12-27 DIAGNOSIS — J479 Bronchiectasis, uncomplicated: Secondary | ICD-10-CM | POA: Diagnosis not present

## 2022-12-27 DIAGNOSIS — I483 Typical atrial flutter: Secondary | ICD-10-CM | POA: Diagnosis not present

## 2022-12-27 DIAGNOSIS — I700 Atherosclerosis of aorta: Secondary | ICD-10-CM | POA: Diagnosis not present

## 2022-12-27 MED ORDER — METOPROLOL SUCCINATE ER 25 MG PO TB24: 25.0000 mg | ORAL_TABLET | Freq: Every day | ORAL | 11 refills | Status: DC

## 2022-12-27 MED ORDER — LOSARTAN POTASSIUM 25 MG PO TABS: 25.0000 mg | ORAL_TABLET | Freq: Every day | ORAL | 3 refills | Status: DC

## 2022-12-27 MED ORDER — APIXABAN 5 MG PO TABS: 5.0000 mg | ORAL_TABLET | Freq: Two times a day (BID) | ORAL | Status: DC

## 2022-12-27 NOTE — Plan of Care (Signed)

## 2022-12-27 NOTE — Plan of Care (Signed)
  Problem: Education: Goal: Knowledge of cardiac device and self-care will improve 12/27/2022 1046 by Georgiann Mccoy, RN Outcome: Adequate for Discharge 12/27/2022 0910 by Georgiann Mccoy, RN Outcome: Progressing Goal: Ability to safely manage health related needs after discharge will improve 12/27/2022 1046 by Georgiann Mccoy, RN Outcome: Adequate for Discharge 12/27/2022 0910 by Georgiann Mccoy, RN Outcome: Progressing Goal: Individualized Educational Video(s) 12/27/2022 1046 by Georgiann Mccoy, RN Outcome: Adequate for Discharge 12/27/2022 0910 by Georgiann Mccoy, RN Outcome: Progressing   Problem: Cardiac: Goal: Ability to achieve and maintain adequate cardiopulmonary perfusion will improve 12/27/2022 1046 by Georgiann Mccoy, RN Outcome: Adequate for Discharge 12/27/2022 0910 by Georgiann Mccoy, RN Outcome: Progressing   Problem: Education: Goal: Knowledge of General Education information will improve Description: Including pain rating scale, medication(s)/side effects and non-pharmacologic comfort measures 12/27/2022 1046 by Georgiann Mccoy, RN Outcome: Adequate for Discharge 12/27/2022 0910 by Georgiann Mccoy, RN Outcome: Progressing   Problem: Health Behavior/Discharge Planning: Goal: Ability to manage health-related needs will improve 12/27/2022 1046 by Georgiann Mccoy, RN Outcome: Adequate for Discharge 12/27/2022 0910 by Georgiann Mccoy, RN Outcome: Progressing   Problem: Clinical Measurements: Goal: Ability to maintain clinical measurements within normal limits will improve 12/27/2022 1046 by Georgiann Mccoy, RN Outcome: Adequate for Discharge 12/27/2022 0910 by Georgiann Mccoy, RN Outcome: Progressing Goal: Will remain free from infection 12/27/2022 1046 by Georgiann Mccoy, RN Outcome: Adequate for Discharge 12/27/2022 0910 by Georgiann Mccoy, RN Outcome: Progressing Goal: Diagnostic test results will improve 12/27/2022 1046 by Georgiann Mccoy,  RN Outcome: Adequate for Discharge 12/27/2022 0910 by Georgiann Mccoy, RN Outcome: Progressing Goal: Respiratory complications will improve 12/27/2022 1046 by Georgiann Mccoy, RN Outcome: Adequate for Discharge 12/27/2022 0910 by Georgiann Mccoy, RN Outcome: Progressing Goal: Cardiovascular complication will be avoided 12/27/2022 1046 by Georgiann Mccoy, RN Outcome: Adequate for Discharge 12/27/2022 0910 by Georgiann Mccoy, RN Outcome: Progressing   Problem: Activity: Goal: Risk for activity intolerance will decrease 12/27/2022 1046 by Georgiann Mccoy, RN Outcome: Adequate for Discharge 12/27/2022 0910 by Georgiann Mccoy, RN Outcome: Progressing   Problem: Nutrition: Goal: Adequate nutrition will be maintained 12/27/2022 1046 by Georgiann Mccoy, RN Outcome: Adequate for Discharge 12/27/2022 0910 by Georgiann Mccoy, RN Outcome: Progressing   Problem: Coping: Goal: Level of anxiety will decrease 12/27/2022 1046 by Georgiann Mccoy, RN Outcome: Adequate for Discharge 12/27/2022 0910 by Georgiann Mccoy, RN Outcome: Progressing   Problem: Elimination: Goal: Will not experience complications related to bowel motility 12/27/2022 1046 by Georgiann Mccoy, RN Outcome: Adequate for Discharge 12/27/2022 0910 by Georgiann Mccoy, RN Outcome: Progressing Goal: Will not experience complications related to urinary retention 12/27/2022 1046 by Georgiann Mccoy, RN Outcome: Adequate for Discharge 12/27/2022 0910 by Georgiann Mccoy, RN Outcome: Progressing   Problem: Pain Managment: Goal: General experience of comfort will improve 12/27/2022 1046 by Georgiann Mccoy, RN Outcome: Adequate for Discharge 12/27/2022 0910 by Georgiann Mccoy, RN Outcome: Progressing   Problem: Safety: Goal: Ability to remain free from injury will improve 12/27/2022 1046 by Georgiann Mccoy, RN Outcome: Adequate for Discharge 12/27/2022 0910 by Georgiann Mccoy, RN Outcome: Progressing   Problem: Skin  Integrity: Goal: Risk for impaired skin integrity will decrease 12/27/2022 1046 by Georgiann Mccoy, RN Outcome: Adequate for Discharge 12/27/2022 0910 by Georgiann Mccoy, RN Outcome: Progressing

## 2022-12-27 NOTE — Discharge Summary (Addendum)
ELECTROPHYSIOLOGY DISCHARGE SUMMARY    Patient ID: Carlos Choi,  MRN: [id number], DOB/AGE: 1946-04-25 77 y.o.  Admit date: 12/26/2022 Discharge date: 12/27/2022  Primary Care Physician: [name], MD  Primary Cardiologist: None  Electrophysiologist: Dr. [name]   Primary Discharge Diagnosis:  Second Degree AV Block, Complete Heart Block status post pacemaker implantation this admission  Secondary Discharge Diagnosis:  Atrial Flutter  Frequent PVC's Secondary Hypercoagulable State  Cardiomyopathy  Hypothyroidism  Bronchiectasis   No Known Allergies   Procedures This Admission:  1.  Implantation of a Abbott Dual Chamber PPM on 12/26/22 by Dr. [name]. Initial left sided implant attempted but vein occlusion encountered. Transitioned to right sided PPM approach. The patient received a Abbott Assurity [id number] with a Abbott Ultipace 1231-52 right atrial lead and a Abbott Ultipace 1231-65 right ventricular lead.  There were no immediate post procedure complications.   2.  CXR on 12/27/2022 demonstrated no pneumothorax status post device implantation.       Brief HPI: Carlos Choi is a 77 y.o. male was referred to electrophysiology in the outpatient setting for  consideration of PPM implantation.  Past medical history includes hypothyroidism, bronchiectasis, atrial flutter, frequent PVC's, and symptomatic high grade AV block, Mobitz I.  The patient has had AV block without reversible causes identified.  Risks, benefits, and alternatives to PPM implantation were reviewed with the patient who wished to proceed.   Hospital Course:  The patient was admitted and underwent implantation of a Abbott dual chamber PPM with details as outlined above.  He was monitored on telemetry overnight which demonstrated appropriate pacing.  Left chest was without hematoma or ecchymosis.  The device was interrogated and found to be functioning normally.  CXR was obtained and demonstrated no  pneumothorax status post device implantation.  Wound care, arm mobility, and restrictions were reviewed with the patient.  The patient was examined and considered stable for discharge to home.    Anticoagulation resumption This patient should resume their Eliquis on Tuesday December 31, 2022    Physical Exam: Vitals:   12/26/22 1941 12/26/22 2332 12/27/22 0328 12/27/22 0748  BP: 127/71 138/78 132/75 (!) 151/78  Pulse:  66 61 74  Resp: 14 18 19 19   Temp: 98 F (36.7 C) 97.6 F (36.4 C) 97.6 F (36.4 C) 97.8 F (36.6 C)  TempSrc: Oral Oral Oral Oral  SpO2: 98% 98% 97% 98%  Weight:      Height:        GEN- NAD. A&O x 3.  HEENT: Normocephalic, atraumatic Lungs- CTAB, Normal effort.  Heart- RRR, No M/G/R.  GI- Soft, NT, ND.  Extremities- No clubbing, cyanosis, or edema;  Skin- warm and dry, no rash or lesion, left chest without hematoma/ecchymosis  Discharge Medications:  Allergies as of 12/27/2022   No Known Allergies      Medication List     TAKE these medications    acetaminophen 650 MG CR tablet Commonly known as: TYLENOL Take 1,300 mg by mouth 2 (two) times a week.   Alka-Seltzer Gold [id number] MG Tbef Generic drug: Sod Bicarb-K Bicarb-Citric Acd Take 2 tablets by mouth daily as needed (Upset stomach).   ALPRAZolam 0.25 MG tablet Commonly known as: XANAX Take 1 tablet (0.25 mg total) by mouth daily as needed for anxiety.   apixaban 5 MG Tabs tablet Commonly known as: Eliquis Take 1 tablet (5 mg total) by mouth 2 (two) times daily. Start taking on: December 31, 2022 What changed: These  instructions start on December 31, 2022. If you are unsure what to do until then, ask your doctor or other care provider.   atorvastatin 10 MG tablet Commonly known as: LIPITOR Take 1 tablet (10 mg total) by mouth daily.   CENTRUM SILVER PO Take 1 tablet by mouth daily.   CULTURELLE DIGESTIVE HEALTH PO Take 1 capsule by mouth daily.   Fish Oil 1000 MG Caps Take 2,000 mg by  mouth daily.   hyoscyamine 0.125 MG tablet Commonly known as: LEVSIN Take 1 tablet (0.125 mg total) by mouth every 6 (six) hours as needed for cramping (diarrhea).   ipratropium 0.03 % nasal spray Commonly known as: ATROVENT Place 2 sprays into both nostrils daily as needed for rhinitis.   levothyroxine 100 MCG tablet Commonly known as: SYNTHROID Take 1 tablet (100 mcg total) by mouth daily.   loratadine 10 MG tablet Commonly known as: CLARITIN Take 10 mg by mouth daily.   losartan 25 MG tablet Commonly known as: COZAAR Take 25 mg by mouth daily.   metoprolol succinate 25 MG 24 hr tablet Commonly known as: Toprol XL Take 1 tablet (25 mg total) by mouth daily.   MOMETASONE FURO & DIMETHICONE EX Place 2 drops in ear(s) daily as needed (Itching /fluid).   Mucinex Maximum Strength 1200 MG Tb12 Generic drug: Guaifenesin Take 1,200 mg by mouth daily as needed (Congestion).   PSYLLIUM PO Take 1 packet by mouth daily as needed (Regulate bowel movement). Taking 1 tsp daily in water   silodosin 8 MG Caps capsule Commonly known as: RAPAFLO Take 8 mg by mouth daily.        Disposition: Home.  Discharge Instructions     Call MD for:  difficulty breathing, headache or visual disturbances   Complete by: As directed    Call MD for:  severe uncontrolled pain   Complete by: As directed    Call MD for:  temperature >100.4   Complete by: As directed    Diet - low sodium heart healthy   Complete by: As directed    Increase activity slowly   Complete by: As directed         Duration of Discharge Encounter: Greater than 30 minutes including physician time.  Signed, Canary Brim, MSN, APRN, NP-C, AGACNP-BC [facility] - Electrophysiology  12/27/2022, 10:08 AM

## 2022-12-27 NOTE — Plan of Care (Signed)
  Problem: Education: Goal: Knowledge of cardiac device and self-care will improve Outcome: Adequate for Discharge Goal: Ability to safely manage health related needs after discharge will improve Outcome: Adequate for Discharge   Problem: Cardiac: Goal: Ability to achieve and maintain adequate cardiopulmonary perfusion will improve Outcome: Adequate for Discharge   Problem: Education: Goal: Knowledge of General Education information will improve Description: Including pain rating scale, medication(s)/side effects and non-pharmacologic comfort measures Outcome: Adequate for Discharge   Problem: Clinical Measurements: Goal: Ability to maintain clinical measurements within normal limits will improve Outcome: Adequate for Discharge Goal: Will remain free from infection Outcome: Adequate for Discharge Goal: Diagnostic test results will improve Outcome: Adequate for Discharge Goal: Respiratory complications will improve Outcome: Adequate for Discharge Goal: Cardiovascular complication will be avoided Outcome: Adequate for Discharge   Problem: Activity: Goal: Risk for activity intolerance will decrease Outcome: Adequate for Discharge   Problem: Elimination: Goal: Will not experience complications related to bowel motility Outcome: Adequate for Discharge Goal: Will not experience complications related to urinary retention Outcome: Adequate for Discharge   Problem: Safety: Goal: Ability to remain free from injury will improve Outcome: Adequate for Discharge   Problem: Pain Managment: Goal: General experience of comfort will improve Outcome: Adequate for Discharge   Problem: Skin Integrity: Goal: Risk for impaired skin integrity will decrease Outcome: Adequate for Discharge

## 2022-12-27 NOTE — Addendum Note (Signed)
Addended by: Burnetta Sabin on: 12/27/2022 01:24 PM   Modules accepted: Orders

## 2022-12-27 NOTE — Discharge Instructions (Addendum)
After Your Pacemaker   You have a Abbott Pacemaker  ACTIVITY Do not lift your arm above shoulder height for 1 week after your procedure. After 7 days, you may progress as below.  You should remove your sling 24 hours after your procedure, unless otherwise instructed by your provider.     Friday January 03, 2023  Saturday January 04, 2023 Sunday January 05, 2023 Monday January 06, 2023   Do not lift, push, pull, or carry anything over 10 pounds with the affected arm until 6 weeks (Friday February 07, 2023 ) after your procedure.   You may drive AFTER your wound check, unless you have been told otherwise by your provider.   Ask your healthcare provider when you can go back to work   INCISION/Dressing  Ok to resume [place] on July 16th am.   If large square, outer bandage is left in place, this can be removed after 24 hours from your procedure. Do not remove steri-strips or glue as below.   If a PRESSURE DRESSING (a bulky dressing that usually goes up over your shoulder) was applied or left in place, please follow instructions given by your provider on when to return to have this removed.   Monitor your Pacemaker site for redness, swelling, and drainage. Call the device clinic at [phone] if you experience these symptoms or fever/chills.  If your incision is sealed with Steri-strips or staples, you may shower 7 days after your procedure or when told by your provider. Do not remove the steri-strips or let the shower hit directly on your site. You may wash around your site with soap and water.    If you were discharged in a sling, please do not wear this during the day more than 48 hours after your surgery unless otherwise instructed. This may increase the risk of stiffness and soreness in your shoulder.   Avoid lotions, ointments, or perfumes over your incision until it is well-healed.  You may use a hot tub or a pool AFTER your wound check appointment if the incision is completely  closed.  Pacemaker Alerts:  Some alerts are vibratory and others beep. These are NOT emergencies. Please call our office to let us know. If this occurs at night or on weekends, it can wait until the next business day. Send a remote transmission.  If your device is capable of reading fluid status (for heart failure), you will be offered monthly monitoring to review this with you.   DEVICE MANAGEMENT Remote monitoring is used to monitor your pacemaker from home. This monitoring is scheduled every 91 days by our office. It allows [place] to keep an eye on the functioning of your device to ensure it is working properly. You will routinely see your Electrophysiologist annually (more often if necessary).   You should receive your ID card for your new device in 4-8 weeks. Keep this card with you at all times once received. Consider wearing a medical alert bracelet or necklace.  Your Pacemaker may be MRI compatible. This will be discussed at your next office visit/wound check.  You should avoid contact with strong electric or magnetic fields.   Do not use amateur (ham) radio equipment or electric (arc) welding torches. MP3 player headphones with magnets should not be used. Some devices are safe to use if held at least 12 inches (30 cm) from your Pacemaker. These include power tools, lawn mowers, and speakers. If you are unsure if something is safe to use, ask your health  care provider.  When using your cell phone, hold it to the ear that is on the opposite side from the Pacemaker. Do not leave your cell phone in a pocket over the Pacemaker.  You may safely use electric blankets, heating pads, computers, and microwave ovens.  Call the office right away if: You have chest pain. You feel more short of breath than you have felt before. You feel more light-headed than you have felt before. Your incision starts to open up.  This information is not intended to replace advice given to you by your health care  provider. Make sure you discuss any questions you have with your health care provider.

## 2023-01-09 ENCOUNTER — Ambulatory Visit: Payer: Medicare Other | Attending: Cardiovascular Disease

## 2023-01-09 DIAGNOSIS — I459 Conduction disorder, unspecified: Secondary | ICD-10-CM

## 2023-01-09 DIAGNOSIS — I2510 Atherosclerotic heart disease of native coronary artery without angina pectoris: Secondary | ICD-10-CM

## 2023-01-09 LAB — CUP PACEART INCLINIC DEVICE CHECK
Battery Remaining Longevity: 62 mo
Battery Voltage: 3.04 V
Brady Statistic RA Percent Paced: 46 %
Brady Statistic RV Percent Paced: 86 %
Date Time Interrogation Session: 20240725131745
Implantable Lead Connection Status: 753985
Implantable Lead Connection Status: 753985
Implantable Lead Implant Date: 20240711
Implantable Lead Implant Date: 20240711
Implantable Lead Location: 753859
Implantable Lead Location: 753860
Implantable Pulse Generator Implant Date: 20240711
Lead Channel Impedance Value: 462.5 Ohm
Lead Channel Impedance Value: 537.5 Ohm
Lead Channel Pacing Threshold Amplitude: 0.5 V
Lead Channel Pacing Threshold Amplitude: 0.5 V
Lead Channel Pacing Threshold Amplitude: 0.75 V
Lead Channel Pacing Threshold Amplitude: 0.75 V
Lead Channel Pacing Threshold Pulse Width: 0.5 ms
Lead Channel Pacing Threshold Pulse Width: 0.5 ms
Lead Channel Pacing Threshold Pulse Width: 0.5 ms
Lead Channel Pacing Threshold Pulse Width: 0.5 ms
Lead Channel Sensing Intrinsic Amplitude: 5 mV
Lead Channel Sensing Intrinsic Amplitude: 7 mV
Lead Channel Setting Pacing Amplitude: 3.5 V
Lead Channel Setting Pacing Amplitude: 3.5 V
Lead Channel Setting Pacing Pulse Width: 0.5 ms
Lead Channel Setting Sensing Sensitivity: 0.5 mV
Pulse Gen Model: 2272
Pulse Gen Serial Number: 5829995

## 2023-01-09 NOTE — Patient Instructions (Signed)

## 2023-01-09 NOTE — Progress Notes (Signed)
Wound check appointment. Steri-strips removed from right/left chest. Right side (device site) wound without redness or edema. Incision edges approximated, wound well healed.   Left side (first attempt) wound site with small hematoma, no edema.  Old bruising noted.  Incision edges approximated.   Normal device function. Thresholds, sensing, and impedances consistent with implant measurements. Device programmed at 3.5V/auto capture programmed on for extra safety margin until 3 month visit. Histogram distribution appropriate for patient and level of activity. Brief atrial tachycardia noted.  PVC burden 11% per device.  Patient educated about wound care, arm mobility, lifting restrictions. ROV in 3 months with implanting physician.  Pt requesting referral to primary cardiologist.  Order placed per Dr. [name].

## 2023-01-20 ENCOUNTER — Telehealth: Payer: Self-pay

## 2023-01-20 ENCOUNTER — Other Ambulatory Visit: Payer: Self-pay

## 2023-01-20 MED ORDER — APIXABAN 5 MG PO TABS: 5.0000 mg | ORAL_TABLET | Freq: Two times a day (BID) | ORAL | 5 refills | Status: DC

## 2023-01-20 NOTE — Telephone Encounter (Signed)
I spoke with patient's wife.  She is requesting refills of Losartan and Eliquis.  I told her it looks like refills for Losartan were sent to [facility] on July 12.  She will reach out to [place].  I told patient's wife I would check on Eliquis refills and refill would be sent in if needed.   I spoke with [facility] and they confirm Losartan has refills.  Eliquis has one more refill. Will sent to coumadin clinic to send in additional Eliquis refills.

## 2023-01-20 NOTE — Telephone Encounter (Signed)
Pt wife called in wanting to know if pt can get refills on meds to hold him until he is seen with gen cards.

## 2023-01-23 DIAGNOSIS — J320 Chronic maxillary sinusitis: Secondary | ICD-10-CM | POA: Diagnosis not present

## 2023-01-29 ENCOUNTER — Telehealth: Payer: Self-pay | Admitting: Cardiovascular Disease

## 2023-01-29 NOTE — Telephone Encounter (Signed)
   Pre-operative Risk Assessment    Patient Name: Carlos Choi  DOB: [birth year] MRN: [id number]      Request for Surgical Clearance    Procedure:   Right Endoscopic Sinus   Date of Surgery:  Clearance 02/05/23                                 Surgeon:  Dr. Serena Colonel Surgeon's Group or Practice Name:  [facility] Phone number:  [phone] Fax number:  [phone]   Type of Clearance Requested:   - Medical  - Pharmacy   5. What type of anesthesia will be used?  Press F2 and select the anesthesia to be used for the procedure.  :1}  Type of Anesthesia:  General    Additional requests/questions:   Requesting for medical and pharmacy clearance.  [name]   01/29/2023, 10:22 AM

## 2023-01-29 NOTE — Telephone Encounter (Signed)
   Name: Carlos Choi  DOB: 1946/02/10  MRN: [id number]  Primary Cardiologist: None  Chart reviewed as part of pre-operative protocol coverage. Because of Carlos Choi's past medical history and time since last visit, he will require a follow-up in-office visit in order to better assess preoperative cardiovascular risk.  Pre-op covering staff: - Please schedule appointment and call patient to inform them. If patient already had an upcoming appointment within acceptable timeframe, please add "pre-op clearance" to the appointment notes so provider is aware. - Please contact requesting surgeon's office via preferred method (i.e, phone, fax) to inform them of need for appointment prior to surgery.  Per office protocol, patient can hold Eliquis for 2 days prior to procedure.      Sharlene Dory, PA-C  01/29/2023, 11:22 AM

## 2023-01-29 NOTE — Telephone Encounter (Signed)
Patient with diagnosis of afib on Eliquis for anticoagulation.    Procedure: Right Endoscopic Sinus  Date of procedure: 02/05/23   CHA2DS2-VASc Score = 3   This indicates a 3.2% annual risk of stroke. [name] score is based upon: CHF History: 1 HTN History: 0 Diabetes History: 0 Stroke History: 0 Vascular Disease History: 0 Age Score: 2 Gender Score: 0      CrCl 75 ml/min  Per office protocol, patient can hold Eliquis for 2 days prior to procedure.    **This guidance is not considered finalized until pre-operative APP has relayed final recommendations.**

## 2023-01-30 NOTE — H&P (Signed)
HPI:  Carlos Choi is a 77 y.o. male who presents as a consult Patient.  Referring Provider: Chaney Malling, MD  Chief complaint: Sinus problem.  HPI: History of bronchiectasis, followed by Dr. [name]. He underwent CT of the sinus recently which revealed a surprising finding of chronic maxillary disease. He does not really have any nasal symptoms other than his nose drips when he eats. He denies nasal obstruction or any foul discharge. He denies any pain or pressure. He does have chronic itching of his ears.  PMH/Meds/All/SocHx/FamHx/ROS:  Past Medical History: Diagnosis Date Bronchiectasis (CMS/HCC)  History reviewed. No pertinent surgical history.  No family history of bleeding disorders, wound healing problems or difficulty with anesthesia.    Current Outpatient Medications: ALPRAZolam (XANAX) 0.25 mg tablet, Take 0.25 mg by mouth daily as needed., Disp: , Rfl: aspirin 81 mg EC tablet, Take 81 mg by mouth., Disp: , Rfl: atorvastatin (LIPITOR) 10 mg tablet, Take 10 mg by mouth daily., Disp: , Rfl: folic acid/multivit-min/lutein (CENTRUM SILVER ORAL), , Disp: , Rfl: hyoscyamine (LEVSIN) 0.125 mg tablet, Take 0.125 mg by mouth., Disp: , Rfl: levothyroxine (SYNTHROID) 100 mcg tablet, Take 100 mcg by mouth daily., Disp: , Rfl: loratadine (CLARITIN) 10 mg tablet, Take 10 mg by mouth daily., Disp: , Rfl: omega 3-dha-epa-fish oil (Fish OiL) 1,000 mg cap capsule, , Disp: , Rfl: silodosin (RAPAFLO) 8 mg cap capsule, Take 8 mg by mouth daily., Disp: , Rfl:  A complete ROS was performed with pertinent positives/negatives noted in the HPI. The remainder of the ROS are negative.   Physical Exam:  Pulse 50  Temp 97.9 F (36.6 C) (Temporal)  Resp 18  Ht 1.93 m (6\' 4" )  Wt 88.9 kg (196 lb)  SpO2 97%  BMI 23.86 kg/m  General: Healthy and alert, in no distress, breathing easily. Normal affect. In a pleasant mood. Head: Normocephalic, atraumatic. No masses, or scars. Eyes:  Pupils are equal, and reactive to light. Vision is grossly intact. No spontaneous or gaze nystagmus. Ears: Ear canals are clear. Tympanic membranes are intact, with normal landmarks and the middle ears are clear and healthy. Hearing: Grossly normal. Nose: Nasal cavities are clear with healthy mucosa, no polyps or exudate. Airways are patent. Face: No masses or scars, facial nerve function is symmetric. Oral Cavity: No mucosal abnormalities are noted. Tongue with normal mobility. Dentition appears healthy. Oropharynx: Tonsils are symmetric. There are no mucosal masses identified. Tongue base appears normal and healthy. Larynx/Hypopharynx: deferred Chest: Deferred Neck: No palpable masses, no cervical adenopathy, no thyroid nodules or enlargement. Neuro: Cranial nerves II-XII with normal function. Balance: Normal gate. Other findings: none.  Independent Review of Additional Tests or Records: CT maxillofacial from 07/26/2022:  IMPRESSION: Complete opacification of the right maxillary sinus with internal hyperdensity, suggestive of inspissated secretions and/or fungal colonization. No evidence of bony destruction.  Procedures: none  Impression & Plans: Chronic itching of the ears. Recommend Elocon lotion steroid drops.  Chronic rhinitis and rhinorrhea when he eats. Recommend a trial of ipratropium spray.  Chronic maxillary sinusitis consistent with fungal sinusitis. Recommend maxillary antrostomy with removal of fungal debris. He will discuss this with his wife and will contact us when he is ready.

## 2023-02-03 ENCOUNTER — Encounter: Payer: Self-pay | Admitting: Cardiovascular Disease

## 2023-02-03 ENCOUNTER — Ambulatory Visit: Payer: Medicare Other | Attending: Cardiovascular Disease | Admitting: Cardiovascular Disease

## 2023-02-03 VITALS — BP 114/80 | HR 66 | Ht 76.0 in | Wt 181.4 lb

## 2023-02-03 DIAGNOSIS — I4891 Unspecified atrial fibrillation: Secondary | ICD-10-CM

## 2023-02-03 DIAGNOSIS — I5022 Chronic systolic (congestive) heart failure: Secondary | ICD-10-CM | POA: Diagnosis not present

## 2023-02-03 DIAGNOSIS — J342 Deviated nasal septum: Secondary | ICD-10-CM | POA: Diagnosis not present

## 2023-02-03 DIAGNOSIS — I483 Typical atrial flutter: Secondary | ICD-10-CM

## 2023-02-03 DIAGNOSIS — I459 Conduction disorder, unspecified: Secondary | ICD-10-CM | POA: Diagnosis not present

## 2023-02-03 DIAGNOSIS — J320 Chronic maxillary sinusitis: Secondary | ICD-10-CM | POA: Diagnosis not present

## 2023-02-03 NOTE — Patient Instructions (Signed)
Medication Instructions:  Your physician recommends that you continue on your current medications as directed. Please refer to the Current Medication list given to you today. *If you need a refill on your cardiac medications before your next appointment, please call your pharmacy*   Follow-Up: At [facility], you and your health needs are our priority.  As part of our continuing mission to provide you with exceptional heart care, we have created designated Provider Care Teams.  These Care Teams include your primary Cardiologist (physician) and Advanced Practice Providers (APPs -  Physician Assistants and Nurse Practitioners) who all work together to provide you with the care you need, when you need it.  We recommend signing up for the patient portal called "MyChart".  Sign up information is provided on this After Visit Summary.  MyChart is used to connect with patients for Virtual Visits (Telemedicine).  Patients are able to view lab/test results, encounter notes, upcoming appointments, etc.  Non-urgent messages can be sent to your provider as well.   To learn more about what you can do with MyChart, go to [email or web address].    Your next appointment:   Keep follow up appointment on 10/11  Provider:   York Pellant, MD

## 2023-02-03 NOTE — Progress Notes (Signed)
Electrophysiology Office Note:    Date:  02/03/2023   ID:  Carlos Choi, DOB [birth year], MRN [id number]  PCP:  [name], MD   [facility] Providers Cardiologist:  None     Referring MD: [name], MD   History of Present Illness:    Carlos Choi is a 77 y.o. male with a medical history significant for smoking, bronchiectasis, hypothyroidism, and atrial flutter referred for bradycardia.     He was diagnosed with atrial flutter incidentally at the time of maxillary antrostomy on 10/16/2022.  He had slow ventricular rates, 35 to 70 bpm.  He was unaware of his arrhythmia.   Eliquis started and he underwent DC cardioversion.   ECG from May 30 shows Mobitz 1 AV block. He has been having significant fatigue. He is able to walk a mile and a half at a slow pace with his wife every morning. He has noticed intermittent ankle swelling over the past 1 months.  He underwent pacemaker placement on December 26, 2022. This was a complicated procedure. His left axillary vein was occluded, so we had to perform a right-sided implant     Today, he reports that he is doing reasonly well.  He does not have any discomfort at the device site.  He is planning to undergo sinus surgery later this week.    EKGs/Labs/Other Studies Reviewed Today:    Echocardiogram:  TTE 11/15/2022 EF 45-50%. Moderately dilated LA, severely dilated RA   Monitors:   Stress testing:   Advanced imaging:   Cardiac catherization   EKG:   EKG Interpretation Date/Time:  Monday February 03 2023 12:25:15 EDT Ventricular Rate:  66 PR Interval:  158 QRS Duration:  166 QT Interval:  482 QTC Calculation: 505 R Axis:   -73  Text Interpretation: Atrial-sensed ventricular-paced rhythm with occasional AV dual-paced complexes and with occasional Premature ventricular complexes When compared with ECG of 27-Dec-2022 03:23, No significant change was found Confirmed by York Pellant [phone]) on  02/03/2023 12:41:53 PM     Physical Exam:    VS:  BP 114/80 (BP Location: Left Arm, Patient Position: Sitting, Cuff Size: Large)   Pulse 66   Ht 6\' 4"  (1.93 m)   Wt 181 lb 6.4 oz (82.3 kg)   SpO2 98%   BMI 22.08 kg/m     Wt Readings from Last 3 Encounters:  02/03/23 181 lb 6.4 oz (82.3 kg)  12/26/22 182 lb (82.6 kg)  12/20/22 182 lb (82.6 kg)     GEN:  Well nourished, well developed in no acute distress CARDIAC: irregular rhythm, no murmurs, rubs, gallops The device site is normal -- no tenderness, edema, drainage, redness, threatened erosion.  RESPIRATORY:  Normal work of breathing MUSCULOSKELETAL: no edema    ASSESSMENT & PLAN:    Second degree AV block, complete heart block S/p pacemaker placement July, 2024 His device was interrogated today.  I reviewed the interrogation in detail.  See Paceart for report He is not device dependent today   Atrial flutter Typical atrial flutter on ECG 11/07/2022 Will monitor for reoccurrence after device placement RA is severely dilated Continue apixaban 5 mg Will likely need ablation in the future  Frequent PVCs May be contributing to fatigue and cardiomyopathy Will begin medical therapy after pacemaker is in place  Secondary hypercoagulable state Continue apixaban 5 mg  Cardiomyopathy EF 45% PET is scheduled Continue metoprolol XL 25, losartan 25  Sinus surgery upcoming.  From an electrophysiology standpoint, he  has a low risk for sinus surgery.  His device was interrogated today.  Signed, Maurice Small, MD  02/03/2023 1:06 PM    [facility]

## 2023-02-04 ENCOUNTER — Other Ambulatory Visit: Payer: Self-pay

## 2023-02-04 ENCOUNTER — Encounter (HOSPITAL_COMMUNITY): Payer: Self-pay | Admitting: Otolaryngology

## 2023-02-04 ENCOUNTER — Encounter: Payer: Self-pay | Admitting: Cardiovascular Disease

## 2023-02-04 NOTE — Progress Notes (Signed)
SDW call  Patient was given pre-op instructions over the phone. Patient verbalized understanding of instructions provided.     PCP - Dr. Gaetano Net Cardiologist - Will start seeing Dr. Bjorn Pippin in Oct 2024 Pulmonary:    PPM/ICD - Yes, placed 01/09/2023 Device Orders - requested, waiting to receive Rep Notified - Notified [place]   Chest x-ray - 12/27/2022 EKG -  12/27/2022 Stress Test - ECHO - 11/15/2022 Cardiac Cath -   Sleep Study/sleep apnea/CPAP: denies  Non-diabetic   Blood Thinner Instructions: Eliquis, states last dose 02/02/2023 Aspirin Instructions:denies   ERAS Protcol - NO, NPO   COVID TEST- n/a    Anesthesia review: Yes. New pacemaker and on Eliquis   Patient denies shortness of breath, fever, cough and chest pain over the phone call  Your procedure is scheduled on Wednesday January 31, 2023  Report to [facility] Main Entrance "A" at  0715  A.M., then check in with the Admitting office.  Call this number if you have problems the morning of surgery:  [phone]   If you have any questions prior to your surgery date call [phone]: Open Monday-Friday 8am-4pm If you experience any cold or flu symptoms such as cough, fever, chills, shortness of breath, etc. between now and your scheduled surgery, please notify us at the above number    Remember:  Do not eat or drink after midnight the night before your surgery   Take these medicines the morning of surgery with A SIP OF WATER:  Atorvastatin, levothyroxine, loratadine, metoprolol  As needed: Tylenol, xanax, atrovent nasal spray, ear drops  As of today, STOP taking any Aspirin (unless otherwise instructed by your surgeon) Aleve, Naproxen, Ibuprofen, Motrin, Advil, Goody's, BC's, all herbal medications, fish oil, and all vitamins.

## 2023-02-04 NOTE — Progress Notes (Unsigned)
PERIOPERATIVE PRESCRIPTION FOR IMPLANTED CARDIAC DEVICE PROGRAMMING  Patient Information: Name:  Carlos Choi  DOB:  December 12, 1945  MRN:  [id number]  {TIP - You do not have to delete this tip  -  Copy the info from the staff message sent by the PAT staff  then press F2 here and paste the information using CTL - V on the next line :[id number]}  Planned Procedure:  Right endoscopic maxillary antrostomy with removal of tissue  Surgeon:  Dr. Beverlee Nims  Date of Procedure:  02/05/2023  Cautery will be used.  Position during surgery:  supine   Please send documentation back to:  [facility] (Fax # [phone])  Device Information:  Clinic EP Physician:  Dr. [name]  Device Type:  Pacemaker Manufacturer and Phone #:  St. Jude/Abbott: [phone] Pacemaker Dependent?:  No. Date of Last Device Check:  02/03/23 Normal Device Function?:  Yes.    Electrophysiologist's Recommendations:  Have magnet available. Provide continuous ECG monitoring when magnet is used or reprogramming is to be performed.  Procedure may interfere with device function.  Magnet should be placed over device during procedure.  Per Device Clinic Standing Orders, Dorathy Daft, RN  2:07 PM 02/04/2023

## 2023-02-04 NOTE — Anesthesia Preprocedure Evaluation (Signed)
Anesthesia Evaluation  Patient identified by MRN, date of birth, ID band Patient awake    Reviewed: Allergy & Precautions, NPO status , Patient's Chart, lab work & pertinent test results, reviewed documented beta blocker date and time   History of Anesthesia Complications Negative for: history of anesthetic complications  Airway Mallampati: II  TM Distance: <3 FB Neck ROM: Full    Dental  (+) Dental Advisory Given   Pulmonary former smoker   breath sounds clear to auscultation       Cardiovascular hypertension, Pt. on medications and Pt. on home beta blockers (-) angina + dysrhythmias Atrial Fibrillation + pacemaker (for 2nd degree AVB) + Valvular Problems/Murmurs (small L to R ASD)  Rhythm:Regular Rate:Normal  Echo 11/15/22: IMPRESSIONS  1. Left ventricular EF 45-50%. EF 47.0 %. The LV has mildly decreased function,  global hypokinesis. The left ventricular internal cavity size was mildly dilated.  2. RVF is normal. The right ventricular size is moderately enlarged.  3. Left atrial size was moderately dilated.  4. Right atrial size was severely dilated.  5. The mitral valve is abnormal. Moderate mitral valve regurgitation.  6. The aortic valve is tricuspid. Aortic valve regurgitation is trivial.  7. The inferior vena cava is normal in size with <50% respiratory variability, suggesting right atrial pressure of 8 mmHg.  8. Evidence of atrial level shunting detected by color flow Doppler. There is a small secundum ASD with predominantly left to right shunting across the atrial septum.      Neuro/Psych   Anxiety     negative neurological ROS     GI/Hepatic Neg liver ROS,GERD  Controlled,,  Endo/Other  Hypothyroidism    Renal/GU H/o stones     Musculoskeletal  (+) Arthritis ,    Abdominal   Peds  Hematology Eliquis: last Sunday evening   Anesthesia Other Findings   Reproductive/Obstetrics                              Anesthesia Physical Anesthesia Plan  ASA: 3  Anesthesia Plan: General   Post-op Pain Management: Tylenol PO (pre-op)*   Induction: Intravenous  PONV Risk Score and Plan: 2 and Ondansetron, Dexamethasone and Treatment may vary due to age or medical condition  Airway Management Planned: Oral ETT  Additional Equipment: None  Intra-op Plan:   Post-operative Plan: Extubation in OR  Informed Consent: I have reviewed the patients History and Physical, chart, labs and discussed the procedure including the risks, benefits and alternatives for the proposed anesthesia with the patient or authorized representative who has indicated his/her understanding and acceptance.     Dental advisory given  Plan Discussed with: CRNA and Surgeon  Anesthesia Plan Comments: (PAT note written 02/04/2023 by Shonna Chock, PA-C. St. Jude/Abbott PPM. EP f/u and preoperative evaluation 02/03/23.  )       Anesthesia Quick Evaluation

## 2023-02-04 NOTE — Progress Notes (Addendum)
Anesthesia Chart Review: Carlos Choi  Case: [id number] Date/Time: 02/05/23 0930   Procedure: ENDOSCOPIC MAXILLARY ANTROSTOMY WITH TISSUE REMOVAL (Right)   Anesthesia type: General   Pre-op diagnosis: Chronic maxillary sinusitis   [facility] / [facility]   Surgeons: Serena Colonel, MD       DISCUSSION: Patient is a 77 year old male scheduled for the above procedure. Surgery was initially scheduled for 10/16/22, but he arrived for surgery with a HR of 35-70 bpm, asymptomatic. EKG showed a-flutter of unknown duration, so surgery cancelled until he could have further evaluation by cardiology.   Since then he was seen by Carlos Bells, PA at the [facility] on 10/22/22 and started on Eliquis and scheduled for DCCV. S/p successful DCCV of aflutter 11/14/22 but with bradycardia with AV block (first degree AV block & 2nd degree type 1 AV block with junctional escape beats and PVCs) post conversion and propofol. TTE showed decreased LVEF 45% with global hypokinesis, mildly dilated LV, normal RV systolic function, moderately enlarged RV, moderately dilated LA, severely dilated RA, moderate MR, and small secundum ASD with predominantly left to right shunting.   He was started on losartan and scheduled for out-patient EP evaluation and consideration of future cardiac PET stress test.   He had evaluation by EP Dr. Nelly Choi on 12/06/22. S/p St. Jude/Abbott dual chamber PPM on 12/26/22. He had EP follow-up and preoperative evaluation by Dr. Nelly Choi on 02/03/23. He notes surgery scheduled this week and that PET stress test scheduled in the future (although currently not scheduled). Recent device interrogation showed patient is not device dependent. 11% PVC burden, which may be contributing to cardiomyopathy. He plans to monitor for recurrent aflutter and noted patient may need ablation in the future. In regards to sinus surgery Dr. Nelly Choi wrote, "From an electrophysiology standpoint, he has a low risk for sinus  surgery. His device was interrogated today." He had previously been given instructions to hold Eliquis for 2 days prior to procedure per Carlos Favre, PA-C. Patient reported last Eliquis 02/02/23.   History includes former smoker (quit 06/18/79), aflutter (s/p DCCV 11/14/22), bradycardia/heart block (s/p dual chamber St. Jude/Abbott PPM 12/26/22), hypothyroidism, PVCs, ASD (small secundum ASD with predominantly left-to-right shunting 11/15/22 TTE). bronchiectasis, chronic maxillary sinusitis, BPH.  PPM Interrogation 01/09/23: Normal device function. Thresholds, sensing, and impedances consistent with implant measurements. Device programmed at 3.5V/auto capture programmed on for extra safety margin until 3 month visit. Histogram distribution appropriate for patient and level  of activity. Brief atrial tachycardia noted.  PVC burden 11% per device.   Reviewed cardiology records including 02/03/23 note from Dr. Nelly Choi with anesthesiologist Carlos Ghazi, MD. Anesthesia team to evaluate on the day of surgery.    EP perioperative PPM recommendations have been requested, but are still pending.    VS:  BP Readings from Last 3 Encounters:  02/03/23 114/80  12/27/22 (!) 151/78  12/20/22 120/86   Pulse Readings from Last 3 Encounters:  02/03/23 66  12/27/22 74  12/20/22 (!) 58     PROVIDERS: Carlos Brooks, MD is PCP  Carlos Pellant, MD is EP cardiologist. Also followed by Carlos Bells, PA at the [facility]. Carlos Lesches, MD will be primary cardiologist. Visit scheduled for 03/20/23.  Carlos Hughs, MD is pulmonologist Carlos Pippin, MD is urologist   LABS: For day of procedure as indicated. Most recent lab results in [facility] include: Lab Results  Component Value Date   WBC 4.8 12/05/2022   HGB  16.9 12/05/2022   HCT 50.6 (H) 12/05/2022   PLT 142 12/05/2022   GLUCOSE 96 12/05/2022   CHOL 129 12/05/2022   TRIG 96 12/05/2022   HDL 53 12/05/2022   LDLCALC 58 12/05/2022   ALT 16  12/05/2022   AST 20 12/05/2022   NA 140 12/05/2022   K 4.8 12/05/2022   CL 103 12/05/2022   CREATININE 1.00 12/05/2022   BUN 16 12/05/2022   CO2 28 12/05/2022   TSH 2.06 12/05/2022   PSA 2.10 12/04/2020   HGBA1C 5.7 (H) 12/03/2021    PFTs 11/14/20: FVC 5.07 (94%), post 4.78 (89%). FEV1 3.73 (94%), post 3.50 (88%). DLCO unc 26.76 (88%).   IMAGES: CXR 12/27/22: IMPRESSION: 1. New right-sided pacemaker in position, as above. No pneumothorax or other acute complicating features. 2. Aortic atherosclerosis.   CT Chest 07/24/22: IMPRESSION: - Bilateral pulmonary nodules measuring up to 8 mm, greater than 2 year stability, as above. No dedicated follow-up is required per Fleischner Society guidelines. - Additional sequela of chronic atypical mycobacterial infection, unchanged.  CT Maxillofacial 07/24/22: IMPRESSION: Complete opacification of the right maxillary sinus with internal hyperdensity, suggestive of inspissated secretions and/or fungal colonization. No evidence of bony destruction.    EKG: 02/03/23: Atrial-sensed ventricular-paced rhythm with occasional AV dual-paced complexes and with occasional Premature ventricular complexes When compared with ECG of 27-Dec-2022 03:23, No significant change was found Confirmed by Carlos Choi [phone]) on 02/03/2023 12:41:53 PM   CV: EP Procedure/PPM Implant 12/26/22: PROCEDURES:   1. Dual chamber permanent pacemaker implantation 2. Venograms of the right and left axillary veins -- the left venogram showed occlusion of the left axillary vein  Echo 11/15/22: IMPRESSIONS   1. Left ventricular ejection fraction, by estimation, is 45 to 50%. Left  ventricular ejection fraction by 2D MOD biplane is 47.0 %. The left  ventricle has mildly decreased function. The left ventricle demonstrates  global hypokinesis. The left  ventricular internal cavity size was mildly dilated. Left ventricular  diastolic parameters are indeterminate.   2.  Right ventricular systolic function is normal. The right ventricular  size is moderately enlarged. Tricuspid regurgitation signal is inadequate  for assessing PA pressure.   3. Left atrial size was moderately dilated.   4. Right atrial size was severely dilated.   5. The mitral valve is abnormal. Moderate mitral valve regurgitation.   6. The aortic valve is tricuspid. Aortic valve regurgitation is trivial.   7. The inferior vena cava is normal in size with <50% respiratory  variability, suggesting right atrial pressure of 8 mmHg.   8. Evidence of atrial level shunting detected by color flow Doppler.  There is a small secundum atrial septal defect with predominantly left to  right shunting across the atrial septum.    Past Medical History:  Diagnosis Date   Allergy    Anxiety    Arthritis    mild ankles    ASD (atrial septal defect)    ASD (small secundum ASD with predominantly left-to-right shunting) 11/15/22 TTE   Atrial flutter (HCC)    s/p DCCV 11/04/22   Bronchiectasis (HCC)    Cataract    removed left eye, forming right eye    Diverticulosis    Dysrhythmia    2nd degree AV block, bradycardia s/p PPM 12/26/22; PVCs   Hemorrhoids    Hypothyroid    Kidney stones    Presence of permanent cardiac pacemaker    Rectal bleeding    due to hemorrhoid    Tubular adenoma  of colon 2015    Past Surgical History:  Procedure Laterality Date   CARDIOVERSION N/A 11/14/2022   Procedure: CARDIOVERSION;  Surgeon: Jake Bathe, MD;  Location: [facility] INVAS[facility] CV LAB;  Service: Cardiovascular;  Laterality: N/A;   CATARACT EXTRACTION Left    COLONOSCOPY     HERNIA REPAIR Bilateral    Over ten years ago per patient. He was not sure of the date.   PACEMAKER IMPLANT N/A 12/26/2022   Procedure: PACEMAKER IMPLANT;  Surgeon: Maurice Small, MD;  Location: [facility] CV [facility];  Service: Cardiovascular;  Laterality: N/A;   POLYPECTOMY     ROTATOR CUFF REPAIR     both shoulders - patient does not  remember the exact date    MEDICATIONS: No current facility-administered medications for this encounter.    acetaminophen (TYLENOL) 650 MG CR tablet   ALPRAZolam (XANAX) 0.25 MG tablet   apixaban (ELIQUIS) 5 MG TABS tablet   atorvastatin (LIPITOR) 10 MG tablet   Guaifenesin (MUCINEX MAXIMUM STRENGTH) 1200 MG TB12   hyoscyamine (LEVSIN) 0.125 MG tablet   ipratropium (ATROVENT) 0.03 % nasal spray   Lactobacillus-Inulin (CULTURELLE DIGESTIVE HEALTH PO)   levothyroxine (SYNTHROID) 100 MCG tablet   loratadine (CLARITIN) 10 MG tablet   losartan (COZAAR) 25 MG tablet   metoprolol succinate (TOPROL XL) 25 MG 24 hr tablet   MOMETASONE FURO & DIMETHICONE EX   Multiple Vitamins-Minerals (CENTRUM SILVER PO)   Omega-3 Fatty Acids (FISH OIL) 1000 MG CAPS   PSYLLIUM PO   silodosin (RAPAFLO) 8 MG CAPS capsule   Sod Bicarb-K Bicarb-Citric Acd (ALKA-SELTZER GOLD) [phone] MG TBEF    Shonna Chock, PA-C Surgical Short Stay/Anesthesiology [facility] Phone [phone] [facility] Phone [phone] 02/04/2023 1:10 PM

## 2023-02-05 ENCOUNTER — Encounter (HOSPITAL_COMMUNITY): Payer: Self-pay | Admitting: Otolaryngology

## 2023-02-05 ENCOUNTER — Other Ambulatory Visit: Payer: Self-pay

## 2023-02-05 ENCOUNTER — Encounter (HOSPITAL_COMMUNITY)
Admission: RE | Disposition: A | Discharge status: Home / Self Care | Payer: Self-pay | Source: Ambulatory Visit | Attending: Otolaryngology

## 2023-02-05 ENCOUNTER — Ambulatory Visit (HOSPITAL_COMMUNITY)
Admission: RE | Admit: 2023-02-05 | Discharge: 2023-02-05 | Disposition: A | Discharge status: Home / Self Care | Payer: Medicare Other | Source: Ambulatory Visit | Attending: Otolaryngology | Admitting: Otolaryngology

## 2023-02-05 ENCOUNTER — Ambulatory Visit (HOSPITAL_BASED_OUTPATIENT_CLINIC_OR_DEPARTMENT_OTHER): Payer: Medicare Other | Admitting: Vascular Surgery

## 2023-02-05 ENCOUNTER — Ambulatory Visit (HOSPITAL_COMMUNITY): Payer: Medicare Other | Admitting: Vascular Surgery

## 2023-02-05 DIAGNOSIS — Z79899 Other long term (current) drug therapy: Secondary | ICD-10-CM | POA: Diagnosis not present

## 2023-02-05 DIAGNOSIS — J300 Vasomotor rhinitis: Secondary | ICD-10-CM | POA: Diagnosis not present

## 2023-02-05 DIAGNOSIS — L299 Pruritus, unspecified: Secondary | ICD-10-CM | POA: Diagnosis not present

## 2023-02-05 DIAGNOSIS — K219 Gastro-esophageal reflux disease without esophagitis: Secondary | ICD-10-CM | POA: Insufficient documentation

## 2023-02-05 DIAGNOSIS — I10 Essential (primary) hypertension: Secondary | ICD-10-CM | POA: Insufficient documentation

## 2023-02-05 DIAGNOSIS — Z87891 Personal history of nicotine dependence: Secondary | ICD-10-CM | POA: Diagnosis not present

## 2023-02-05 DIAGNOSIS — I4891 Unspecified atrial fibrillation: Secondary | ICD-10-CM | POA: Diagnosis not present

## 2023-02-05 DIAGNOSIS — Z7901 Long term (current) use of anticoagulants: Secondary | ICD-10-CM | POA: Diagnosis not present

## 2023-02-05 DIAGNOSIS — I083 Combined rheumatic disorders of mitral, aortic and tricuspid valves: Secondary | ICD-10-CM | POA: Diagnosis not present

## 2023-02-05 DIAGNOSIS — J479 Bronchiectasis, uncomplicated: Secondary | ICD-10-CM | POA: Insufficient documentation

## 2023-02-05 DIAGNOSIS — I483 Typical atrial flutter: Secondary | ICD-10-CM | POA: Diagnosis not present

## 2023-02-05 DIAGNOSIS — J320 Chronic maxillary sinusitis: Secondary | ICD-10-CM | POA: Diagnosis not present

## 2023-02-05 DIAGNOSIS — Z950 Presence of cardiac pacemaker: Secondary | ICD-10-CM | POA: Insufficient documentation

## 2023-02-05 DIAGNOSIS — E039 Hypothyroidism, unspecified: Secondary | ICD-10-CM | POA: Diagnosis not present

## 2023-02-05 DIAGNOSIS — J310 Chronic rhinitis: Secondary | ICD-10-CM | POA: Insufficient documentation

## 2023-02-05 HISTORY — DX: Bronchiectasis, uncomplicated: J47.9

## 2023-02-05 HISTORY — DX: Cardiac arrhythmia, unspecified: I49.9

## 2023-02-05 HISTORY — DX: Atrial septal defect, unspecified: Q21.10

## 2023-02-05 HISTORY — DX: Unspecified atrial flutter: I48.92

## 2023-02-05 HISTORY — PX: NASAL SINUS SURGERY: SHX719

## 2023-02-05 LAB — BASIC METABOLIC PANEL
Anion gap: 13 (ref 5–15)
BUN: 14 mg/dL (ref 8–23)
CO2: 20 mmol/L — ABNORMAL LOW (ref 22–32)
Calcium: 9.1 mg/dL (ref 8.9–10.3)
Chloride: 104 mmol/L (ref 98–111)
Creatinine, Ser: 0.94 mg/dL (ref 0.61–1.24)
GFR, Estimated: >60 mL/min (ref >60)
Glucose, Bld: 80 mg/dL (ref 70–99)
Potassium: 4.5 mmol/L (ref 3.5–5.1)
Sodium: 137 mmol/L (ref 135–145)

## 2023-02-05 LAB — CBC
HCT: 47.8 % (ref 39.0–52.0)
Hemoglobin: 16.2 g/dL (ref 13.0–17.0)
MCH: 31.2 pg (ref 26.0–34.0)
MCHC: 33.9 g/dL (ref 30.0–36.0)
MCV: 92.1 fL (ref 80.0–100.0)
Platelets: 137 10*3/uL — ABNORMAL LOW (ref 150–400)
RBC: 5.19 MIL/uL (ref 4.22–5.81)
RDW: 14.3 % (ref 11.5–15.5)
WBC: 6.2 10*3/uL (ref 4.0–10.5)
nRBC: 0 % (ref 0.0–0.2)

## 2023-02-05 SURGERY — SINUS SURGERY, ENDOSCOPIC
Anesthesia: General | Laterality: Left

## 2023-02-05 MED ORDER — OXYCODONE HCL 5 MG PO TABS: 5.0000 mg | ORAL_TABLET | Freq: Once | ORAL | Status: DC | PRN

## 2023-02-05 MED ORDER — FENTANYL CITRATE (PF) 250 MCG/5ML IJ SOLN
INTRAMUSCULAR | Status: DC | PRN
  Administered 2023-02-05: 100 ug via INTRAVENOUS

## 2023-02-05 MED ORDER — BACITRACIN ZINC 500 UNIT/GM EX OINT
TOPICAL_OINTMENT | CUTANEOUS | Status: AC
  Filled 2023-02-05: qty 28.35

## 2023-02-05 MED ORDER — MIDAZOLAM HCL 2 MG/2ML IJ SOLN
INTRAMUSCULAR | Status: AC
  Filled 2023-02-05: qty 2

## 2023-02-05 MED ORDER — PROPOFOL 10 MG/ML IV BOLUS
INTRAVENOUS | Status: DC | PRN
  Administered 2023-02-05: 100 mg via INTRAVENOUS

## 2023-02-05 MED ORDER — OXYMETAZOLINE HCL 0.05 % NA SOLN
NASAL | Status: AC
  Filled 2023-02-05: qty 30

## 2023-02-05 MED ORDER — ROCURONIUM BROMIDE 10 MG/ML (PF) SYRINGE
PREFILLED_SYRINGE | INTRAVENOUS | Status: DC | PRN
  Administered 2023-02-05: 60 mg via INTRAVENOUS

## 2023-02-05 MED ORDER — MEPERIDINE HCL 25 MG/ML IJ SOLN: 6.2500 mg | INTRAMUSCULAR | Status: DC | PRN

## 2023-02-05 MED ORDER — OXYMETAZOLINE HCL 0.05 % NA SOLN
2.0000 | NASAL | Status: DC
  Administered 2023-02-05: 2 via NASAL
  Filled 2023-02-05: qty 30

## 2023-02-05 MED ORDER — MIDAZOLAM HCL 2 MG/2ML IJ SOLN
INTRAMUSCULAR | Status: DC | PRN
  Administered 2023-02-05: 1 mg via INTRAVENOUS

## 2023-02-05 MED ORDER — PROPOFOL 10 MG/ML IV BOLUS
INTRAVENOUS | Status: AC
  Filled 2023-02-05: qty 20

## 2023-02-05 MED ORDER — BACITRACIN-NEOMYCIN-POLYMYXIN OINTMENT TUBE
TOPICAL_OINTMENT | CUTANEOUS | Status: AC
  Filled 2023-02-05: qty 14.17

## 2023-02-05 MED ORDER — OXYMETAZOLINE HCL 0.05 % NA SOLN
NASAL | Status: DC | PRN
  Administered 2023-02-05: 1

## 2023-02-05 MED ORDER — LIDOCAINE 2% (20 MG/ML) 5 ML SYRINGE
INTRAMUSCULAR | Status: AC
  Filled 2023-02-05: qty 5

## 2023-02-05 MED ORDER — SUGAMMADEX SODIUM 200 MG/2ML IV SOLN
INTRAVENOUS | Status: DC | PRN
  Administered 2023-02-05: 200 mg via INTRAVENOUS
  Administered 2023-02-05: 100 mg via INTRAVENOUS

## 2023-02-05 MED ORDER — LIDOCAINE-EPINEPHRINE 1 %-1:100000 IJ SOLN
INTRAMUSCULAR | Status: AC
  Filled 2023-02-05: qty 1

## 2023-02-05 MED ORDER — 0.9 % SODIUM CHLORIDE (POUR BTL) OPTIME
TOPICAL | Status: DC | PRN
  Administered 2023-02-05: 1000 mL

## 2023-02-05 MED ORDER — MIDAZOLAM HCL 2 MG/2ML IJ SOLN: 0.5000 mg | Freq: Once | INTRAMUSCULAR | Status: DC | PRN

## 2023-02-05 MED ORDER — HYDROMORPHONE HCL 1 MG/ML IJ SOLN: 0.2500 mg | INTRAMUSCULAR | Status: DC | PRN

## 2023-02-05 MED ORDER — ONDANSETRON HCL 4 MG/2ML IJ SOLN
INTRAMUSCULAR | Status: AC
  Filled 2023-02-05: qty 2

## 2023-02-05 MED ORDER — ACETAMINOPHEN 500 MG PO TABS
1000.0000 mg | ORAL_TABLET | Freq: Once | ORAL | Status: AC
  Administered 2023-02-05: 1000 mg via ORAL
  Filled 2023-02-05: qty 2

## 2023-02-05 MED ORDER — ONDANSETRON HCL 4 MG/2ML IJ SOLN
INTRAMUSCULAR | Status: DC | PRN
Start: 2023-02-05 — End: 2023-02-05
  Administered 2023-02-05: 4 mg via INTRAVENOUS

## 2023-02-05 MED ORDER — CHLORHEXIDINE GLUCONATE 0.12 % MT SOLN
15.0000 mL | Freq: Once | OROMUCOSAL | Status: AC
  Administered 2023-02-05: 15 mL via OROMUCOSAL
  Filled 2023-02-05: qty 15

## 2023-02-05 MED ORDER — LIDOCAINE 2% (20 MG/ML) 5 ML SYRINGE
INTRAMUSCULAR | Status: DC | PRN
  Administered 2023-02-05: 20 mg via INTRAVENOUS

## 2023-02-05 MED ORDER — DEXAMETHASONE SODIUM PHOSPHATE 10 MG/ML IJ SOLN
INTRAMUSCULAR | Status: DC | PRN
  Administered 2023-02-05: 10 mg via INTRAVENOUS

## 2023-02-05 MED ORDER — BACITRACIN ZINC 500 UNIT/GM EX OINT
TOPICAL_OINTMENT | CUTANEOUS | Status: DC | PRN
  Administered 2023-02-05: 1 via TOPICAL

## 2023-02-05 MED ORDER — ORAL CARE MOUTH RINSE: 15.0000 mL | Freq: Once | OROMUCOSAL | Status: AC

## 2023-02-05 MED ORDER — FENTANYL CITRATE (PF) 250 MCG/5ML IJ SOLN
INTRAMUSCULAR | Status: AC
  Filled 2023-02-05: qty 5

## 2023-02-05 MED ORDER — OXYCODONE HCL 5 MG/5ML PO SOLN: 5.0000 mg | Freq: Once | ORAL | Status: DC | PRN

## 2023-02-05 MED ORDER — LIDOCAINE-EPINEPHRINE 1 %-1:100000 IJ SOLN
INTRAMUSCULAR | Status: DC | PRN
  Administered 2023-02-05: 3 mL

## 2023-02-05 MED ORDER — DEXAMETHASONE SODIUM PHOSPHATE 10 MG/ML IJ SOLN
INTRAMUSCULAR | Status: AC
  Filled 2023-02-05: qty 1

## 2023-02-05 MED ORDER — LACTATED RINGERS IV SOLN: INTRAVENOUS | Status: DC

## 2023-02-05 MED ORDER — PHENYLEPHRINE 80 MCG/ML (10ML) SYRINGE FOR IV PUSH (FOR BLOOD PRESSURE SUPPORT)
PREFILLED_SYRINGE | INTRAVENOUS | Status: DC | PRN
  Administered 2023-02-05: 120 ug via INTRAVENOUS

## 2023-02-05 MED ORDER — ROCURONIUM BROMIDE 10 MG/ML (PF) SYRINGE
PREFILLED_SYRINGE | INTRAVENOUS | Status: AC
  Filled 2023-02-05: qty 10

## 2023-02-05 MED ORDER — PROMETHAZINE HCL 25 MG/ML IJ SOLN: 6.2500 mg | INTRAMUSCULAR | Status: DC | PRN

## 2023-02-05 SURGICAL SUPPLY — 34 items
BAG COUNTER SPONGE SURGICOUNT (BAG) ×1 IMPLANT
BAG SPNG CNTER NS LX DISP (BAG) ×1
BLADE RAD40 ROTATE 4M 4 5PK (BLADE) IMPLANT
BLADE RAD60 ROTATE M4 4 5PK (BLADE) IMPLANT
BLADE TRICUT ROTATE M4 4 5PK (BLADE) ×1 IMPLANT
CANISTER SUCT 3000ML PPV (MISCELLANEOUS) ×1 IMPLANT
DRAPE HALF SHEET 40X57 (DRAPES) IMPLANT
DRESSING NASAL KENNEDY 3.5X.9 (MISCELLANEOUS) IMPLANT
DRSG NASAL KENNEDY 3.5X.9 (MISCELLANEOUS)
DRSG NASOPORE 8CM (GAUZE/BANDAGES/DRESSINGS) IMPLANT
ELECT REM PT RETURN 9FT ADLT (ELECTROSURGICAL)
ELECTRODE REM PT RTRN 9FT ADLT (ELECTROSURGICAL) IMPLANT
FILTER ARTHROSCOPY CONVERTOR (FILTER) IMPLANT
GLOVE ECLIPSE 7.5 STRL STRAW (GLOVE) ×1 IMPLANT
GOWN STRL REUS W/ TWL LRG LVL3 (GOWN DISPOSABLE) ×2 IMPLANT
GOWN STRL REUS W/TWL LRG LVL3 (GOWN DISPOSABLE) ×2
KIT BASIN OR (CUSTOM PROCEDURE TRAY) ×1 IMPLANT
KIT TURNOVER KIT B (KITS) ×1 IMPLANT
NDL PRECISIONGLIDE 27X1.5 (NEEDLE) ×1 IMPLANT
NEEDLE PRECISIONGLIDE 27X1.5 (NEEDLE) ×1
NS IRRIG 1000ML POUR BTL (IV SOLUTION) ×1 IMPLANT
PAD ARMBOARD 7.5X6 YLW CONV (MISCELLANEOUS) ×2 IMPLANT
PATTIES SURGICAL .5 X3 (DISPOSABLE) ×1 IMPLANT
SHEATH ENDOSCRUB 0 DEG (SHEATH) IMPLANT
SHEATH ENDOSCRUB 30 DEG (SHEATH) IMPLANT
SPECIMEN JAR SMALL (MISCELLANEOUS) ×1 IMPLANT
SWAB COLLECTION DEVICE MRSA (MISCELLANEOUS) IMPLANT
SWAB CULTURE ESWAB REG 1ML (MISCELLANEOUS) IMPLANT
SYR 50ML SLIP (SYRINGE) IMPLANT
TOWEL GREEN STERILE FF (TOWEL DISPOSABLE) ×1 IMPLANT
TRAY ENT MC OR (CUSTOM PROCEDURE TRAY) ×1 IMPLANT
TUBE CONNECTING 12X1/4 (SUCTIONS) ×1 IMPLANT
TUBING EXTENTION W/L.L. (IV SETS) IMPLANT
WATER STERILE IRR 1000ML POUR (IV SOLUTION) ×1 IMPLANT

## 2023-02-05 NOTE — Anesthesia Procedure Notes (Signed)
Procedure Name: Intubation Date/Time: 02/05/2023 10:08 AM  Performed by: Sharyn Dross, CRNAPre-anesthesia Checklist: Patient identified, Emergency Drugs available, Suction available and Patient being monitored Patient Re-evaluated:Patient Re-evaluated prior to induction Oxygen Delivery Method: Circle system utilized Preoxygenation: Pre-oxygenation with 100% oxygen Induction Type: IV induction Ventilation: Mask ventilation without difficulty Laryngoscope Size: Mac and 4 Grade View: Grade III Tube type: Oral Tube size: 7.5 mm Number of attempts: 1 Airway Equipment and Method: Stylet and Oral airway Placement Confirmation: ETT inserted through vocal cords under direct vision, positive ETCO2 and breath sounds checked- equal and bilateral Secured at: 22 cm Tube secured with: Tape Dental Injury: Teeth and Oropharynx as per pre-operative assessment

## 2023-02-05 NOTE — Transfer of Care (Signed)
Immediate Anesthesia Transfer of Care Note  Patient: Carlos Choi  Procedure(s) Performed: ENDOSCOPIC MAXILLARY ANTROSTOMY WITH TISSUE REMOVAL (Left)  Patient Location: PACU  Anesthesia Type:General  Level of Consciousness: awake, alert , and oriented  Airway & Oxygen Therapy: Patient connected to face mask oxygen  Post-op Assessment: Report given to RN, Post -op Vital signs reviewed and stable, and Patient moving all extremities X 4  Post vital signs: Reviewed and stable  Last Vitals:  Vitals Value Taken Time  BP 147/78   Temp    Pulse 73 02/05/23 1116  Resp 16 02/05/23 1116  SpO2 98 % 02/05/23 1116  Vitals shown include unfiled device data.  Last Pain:  Vitals:   02/05/23 0822  TempSrc:   PainSc: 0-No pain         Complications: There were no known notable events for this encounter.

## 2023-02-05 NOTE — Op Note (Signed)
OPERATIVE REPORT  DATE OF SURGERY: 02/05/2023  PATIENT:  Carlos Choi,  77 y.o. male  PRE-OPERATIVE DIAGNOSIS:  Chronic maxillary sinusitis, left  POST-OPERATIVE DIAGNOSIS:  Chronic maxillary sinusitis, left  PROCEDURE:  Procedure(s): ENDOSCOPIC MAXILLARY ANTROSTOMY WITH TISSUE REMOVAL, left  SURGEON:  Susy Frizzle, MD  ASSISTANTS: None  ANESTHESIA:   General   EBL: 40 ml  DRAINS: None  LOCAL MEDICATIONS USED: 1% Xylocaine with epinephrine  SPECIMEN: 1.  Left maxillary sinus contents for pathologic evaluation 2.  Left maxillary sinus for fungal culture  COUNTS:  Correct  PROCEDURE DETAILS: The patient was taken to the operating room and placed on the operating table in the supine position. Following induction of general endotracheal anesthesia, the face was draped in a standard fashion.  Using a 0 degree endoscope the left nasal cavity was inspected and lidocaine with epinephrine was infiltrated into the superior and posterior attachments of the left middle turbinate and the lateral nasal wall.  A sickle knife was used to incise the base of the uncinate process and the process was completely removed.  Using a 30 degree endoscope and a curved suction the fontanelle was inspected and the maxillary antrum was entered.  It was filled with purulent material and fungal debris.  This was all cleaned out using combination of suction, forceps and saline irrigation.  Antrostomy was enlarged anteriorly using a backbiting forceps and posteriorly and inferiorly using Tru-Cut forceps.  The bone was taken down to facilitate better exposure into the antrum.  No further work was done on the ethmoid complex.  The maxillary antrum was cleaned out completely.  Specimen was sent for fungal culture and for pathology.  The antrum was then filled with triple antibiotic ointment.  Half of a nasal pore dressing was soaked in Afrin and placed into the infundibulum.  The pharynx was suctioned of blood and  secretions.  Patient was awakened extubated and transferred to recovery in stable condition.    PATIENT DISPOSITION:  To PACU, stable

## 2023-02-05 NOTE — Discharge Instructions (Addendum)
Use Tylenol/Motrin as needed for pain.  Use nasal saline spray, simple saline or Ocean spray, every 30-60 minutes while awake.  Avoid bending over or lifting anything more than 10 pounds for 2 weeks.  Resume Eliquis on Sunday

## 2023-02-05 NOTE — Anesthesia Postprocedure Evaluation (Signed)
Anesthesia Post Note  Patient: Carlos Choi  Procedure(s) Performed: ENDOSCOPIC MAXILLARY ANTROSTOMY WITH TISSUE REMOVAL (Left)     Patient location during evaluation: PACU Anesthesia Type: General Level of consciousness: awake and alert, patient cooperative and oriented Pain management: pain level controlled Vital Signs Assessment: post-procedure vital signs reviewed and stable Respiratory status: spontaneous breathing, nonlabored ventilation and respiratory function stable Cardiovascular status: blood pressure returned to baseline and stable Postop Assessment: no apparent nausea or vomiting Anesthetic complications: no   There were no known notable events for this encounter.  Last Vitals:  Vitals:   02/05/23 1145 02/05/23 1200  BP: 130/74 (!) 132/91  Pulse: 64 71  Resp: 15 14  Temp:  (!) 36.2 C  SpO2: 98% 98%    Last Pain:  Vitals:   02/05/23 1200  TempSrc:   PainSc: 0-No pain                 Cederick Broadnax,E. Dareth Andrew

## 2023-02-05 NOTE — Interval H&P Note (Signed)
History and Physical Interval Note:  02/05/2023 9:13 AM  Carlos Choi  has presented today for surgery, with the diagnosis of Chronic maxillary sinusitis.  The various methods of treatment have been discussed with the patient and family. After consideration of risks, benefits and other options for treatment, the patient has consented to  Procedure(s): ENDOSCOPIC MAXILLARY ANTROSTOMY WITH TISSUE REMOVAL (Right) as a surgical intervention.  [name] history has been reviewed, patient examined, no change in status, stable for surgery.  I have reviewed the patient's chart and labs.  Questions were answered to the patient's satisfaction.     Serena Colonel

## 2023-02-06 ENCOUNTER — Encounter (HOSPITAL_COMMUNITY): Payer: Self-pay | Admitting: Otolaryngology

## 2023-02-06 LAB — SURGICAL PATHOLOGY

## 2023-02-10 DIAGNOSIS — N401 Enlarged prostate with lower urinary tract symptoms: Secondary | ICD-10-CM | POA: Diagnosis not present

## 2023-02-13 DIAGNOSIS — J320 Chronic maxillary sinusitis: Secondary | ICD-10-CM | POA: Diagnosis not present

## 2023-02-19 DIAGNOSIS — R351 Nocturia: Secondary | ICD-10-CM | POA: Diagnosis not present

## 2023-02-19 DIAGNOSIS — N401 Enlarged prostate with lower urinary tract symptoms: Secondary | ICD-10-CM | POA: Diagnosis not present

## 2023-02-19 DIAGNOSIS — R972 Elevated prostate specific antigen [PSA]: Secondary | ICD-10-CM | POA: Diagnosis not present

## 2023-02-20 DIAGNOSIS — L821 Other seborrheic keratosis: Secondary | ICD-10-CM | POA: Diagnosis not present

## 2023-02-20 DIAGNOSIS — D2262 Melanocytic nevi of left upper limb, including shoulder: Secondary | ICD-10-CM | POA: Diagnosis not present

## 2023-02-20 DIAGNOSIS — D225 Melanocytic nevi of trunk: Secondary | ICD-10-CM | POA: Diagnosis not present

## 2023-02-20 DIAGNOSIS — L570 Actinic keratosis: Secondary | ICD-10-CM | POA: Diagnosis not present

## 2023-03-05 LAB — FUNGUS CULTURE WITH STAIN

## 2023-03-05 LAB — FUNGUS CULTURE RESULT

## 2023-03-06 ENCOUNTER — Ambulatory Visit (INDEPENDENT_AMBULATORY_CARE_PROVIDER_SITE_OTHER): Payer: Medicare Other

## 2023-03-06 VITALS — Ht 76.0 in | Wt 182.0 lb

## 2023-03-06 DIAGNOSIS — Z0000 Encounter for general adult medical examination without abnormal findings: Secondary | ICD-10-CM | POA: Diagnosis not present

## 2023-03-06 NOTE — Patient Instructions (Signed)
Carlos Choi , Thank you for taking time to come for your Medicare Wellness Visit. I appreciate your ongoing commitment to your health goals. Please review the following plan we discussed and let me know if I can assist you in the future.   Referrals/Orders/Follow-Ups/Clinician Recommendations: Aim for 30 minutes of exercise or brisk walking, 6-8 glasses of water, and 5 servings of fruits and vegetables each day.  This is a list of the screening recommended for you and due dates:  Health Maintenance  Topic Date Due   DTaP/Tdap/Td vaccine (2 - Td or Tdap) 06/18/2019   COVID-19 Vaccine (7 - 2023-24 season) 02/16/2023   Flu Shot  09/15/2023*   Medicare Annual Wellness Visit  03/05/2024   Colon Cancer Screening  12/15/2025   Pneumonia Vaccine  Completed   Hepatitis C Screening  Completed   Zoster (Shingles) Vaccine  Completed   HPV Vaccine  Aged Out  *Topic was postponed. The date shown is not the original due date.    Advanced directives: (In Chart) A copy of your advanced directives are scanned into your chart should your provider ever need it.  Next Medicare Annual Wellness Visit scheduled for next year: Yes

## 2023-03-06 NOTE — Progress Notes (Signed)
Subjective:   Carlos Choi is a 77 y.o. male who presents for Medicare Annual/Subsequent preventive examination.  Visit Complete: Virtual  I connected with  [name] on 03/06/23 by a audio enabled telemedicine application and verified that I am speaking with the correct person using two identifiers.  Patient Location: Home  Provider Location: Home Office  I discussed the limitations of evaluation and management by telemedicine. The patient expressed understanding and agreed to proceed.  Patient Medicare AWV questionnaire was completed by the patient on 03/04/23; I have confirmed that all information answered by patient is correct and no changes since this date.  Vital Signs: Because this visit was a virtual/telehealth visit, some criteria may be missing or patient reported. Any vitals not documented were not able to be obtained and vitals that have been documented are patient reported.   Cardiac Risk Factors include: advanced age (>2men, >4 women);male gender;dyslipidemia     Objective:    Today's Vitals   03/06/23 1433  Weight: 182 lb (82.6 kg)  Height: 6\' 4"  (1.93 m)   Body mass index is 22.15 kg/m.     03/06/2023    2:38 PM 02/05/2023    8:19 AM 12/26/2022    7:28 PM 12/26/2022    2:24 PM 10/16/2022    9:17 AM 12/04/2020   10:31 AM 10/27/2020    9:57 AM  Advanced Directives  Does Patient Have a Medical Advance Directive? No Yes Yes Yes Yes Yes Yes  Type of Furniture conservator/restorer;Living will Healthcare Power of [place];Living will Healthcare Power of [place];Living will Healthcare Power of [place];Living will Healthcare Power of [place];Living will Healthcare Power of [place];Living will  Does patient want to make changes to medical advance directive?  No - Patient declined No - Patient declined  No - Patient declined No - Patient declined No - Patient declined  Copy of Healthcare Power of Attorney in Chart?  No - copy requested No -  copy requested  No - copy requested No - copy requested No - copy requested  Would patient like information on creating a medical advance directive? Yes (MAU/Ambulatory/Procedural Areas - Information given)          Current Medications (verified) Outpatient Encounter Medications as of 03/06/2023  Medication Sig   acetaminophen (TYLENOL) 650 MG CR tablet Take 1,300 mg by mouth every 8 (eight) hours as needed for pain.   ALPRAZolam (XANAX) 0.25 MG tablet Take 1 tablet (0.25 mg total) by mouth daily as needed for anxiety.   apixaban (ELIQUIS) 5 MG TABS tablet Take 1 tablet (5 mg total) by mouth 2 (two) times daily.   atorvastatin (LIPITOR) 10 MG tablet Take 1 tablet (10 mg total) by mouth daily.   Guaifenesin (MUCINEX MAXIMUM STRENGTH) 1200 MG TB12 Take 1,200 mg by mouth daily as needed (Congestion).   hyoscyamine (LEVSIN) 0.125 MG tablet Take 1 tablet (0.125 mg total) by mouth every 6 (six) hours as needed for cramping (diarrhea).   ipratropium (ATROVENT) 0.03 % nasal spray Place 2 sprays into both nostrils daily as needed for rhinitis.   Lactobacillus-Inulin (CULTURELLE DIGESTIVE HEALTH PO) Take 1 capsule by mouth daily.   levothyroxine (SYNTHROID) 100 MCG tablet Take 1 tablet (100 mcg total) by mouth daily.   loratadine (CLARITIN) 10 MG tablet Take 10 mg by mouth daily.   losartan (COZAAR) 25 MG tablet Take 1 tablet (25 mg total) by mouth daily.   metoprolol succinate (TOPROL XL) 25 MG 24 hr  tablet Take 1 tablet (25 mg total) by mouth daily.   MOMETASONE FURO & DIMETHICONE EX Place 2 drops in ear(s) daily as needed (Itching /fluid).   Multiple Vitamins-Minerals (CENTRUM SILVER PO) Take 1 tablet by mouth daily.   Omega-3 Fatty Acids (FISH OIL) 1000 MG CAPS Take 2,000 mg by mouth daily.   PSYLLIUM PO Take 1 packet by mouth daily as needed (Regulate bowel movement). Taking 1 tsp daily in water   silodosin (RAPAFLO) 8 MG CAPS capsule Take 8 mg by mouth daily.   Sod Bicarb-K Bicarb-Citric Acd  (ALKA-SELTZER GOLD) [phone] MG TBEF Take 2 tablets by mouth daily as needed (Upset stomach).   No facility-administered encounter medications on file as of 03/06/2023.    Allergies (verified) Patient has no known allergies.   History: Past Medical History:  Diagnosis Date   Allergy    Anxiety    Arthritis    mild ankles    ASD (atrial septal defect)    ASD (small secundum ASD with predominantly left-to-right shunting) 11/15/22 TTE   Atrial flutter (HCC)    s/p DCCV 11/04/22   Bronchiectasis (HCC)    Cataract    removed left eye, forming right eye    Diverticulosis    Dysrhythmia    2nd degree AV block, bradycardia s/p PPM 12/26/22; PVCs   Hemorrhoids    Hypothyroid    Kidney stones    Presence of permanent cardiac pacemaker 12/26/2022   St. Jude/Abbott   Rectal bleeding    due to hemorrhoid    Tubular adenoma of colon 2015   Past Surgical History:  Procedure Laterality Date   CARDIOVERSION N/A 11/14/2022   Procedure: CARDIOVERSION;  Surgeon: Jake Bathe, MD;  Location: [facility];  Service: Cardiovascular;  Laterality: N/A;   CATARACT EXTRACTION Left    COLONOSCOPY     HERNIA REPAIR Bilateral    Over ten years ago per patient. He was not sure of the date.   NASAL SINUS SURGERY Left 02/05/2023   Procedure: ENDOSCOPIC MAXILLARY ANTROSTOMY WITH TISSUE REMOVAL;  Surgeon: Serena Colonel, MD;  Location: [facility] OR;  Service: ENT;  Laterality: Left;   PACEMAKER IMPLANT N/A 12/26/2022   Procedure: PACEMAKER IMPLANT;  Surgeon: Maurice Small, MD;  Location: [facility];  Service: Cardiovascular;  Laterality: N/A;   POLYPECTOMY     ROTATOR CUFF REPAIR     both shoulders - patient does not remember the exact date   Family History  Problem Relation Age of Onset   Aneurysm Mother    Alzheimer's disease Father    Cancer Sister        ovarian   Stroke Brother    Hypertension Brother    Colon cancer Neg Hx    Esophageal cancer Neg Hx    Stomach cancer Neg Hx     Rectal cancer Neg Hx    Colon polyps Neg Hx    Social History   Socioeconomic History   Marital status: Married    Spouse name: Carlos Choi.   Number of children: 2   Years of education: Not on file   Highest education level: Not on file  Occupational History   Not on file  Tobacco Use   Smoking status: Former    Current packs/day: 0.00    Average packs/day: 0.5 packs/day for 6.0 years (3.0 ttl pk-yrs)    Types: Cigarettes    Start date: 06/17/1973    Quit date: 06/18/1979    Years since  quitting: 43.7   Smokeless tobacco: Never  Vaping Use   Vaping status: Never Used  Substance and Sexual Activity   Alcohol use: Yes    Comment: 1-2 beers Monthly.   Drug use: No   Sexual activity: Not on file  Other Topics Concern   Not on file  Social History Narrative   Not on file   Social Determinants of Health   Financial Resource Strain: Low Risk  (03/04/2023)   Overall Financial Resource Strain (CARDIA)    Difficulty of Paying Living Expenses: Not hard at all  Food Insecurity: No Food Insecurity (03/04/2023)   Hunger Vital Sign    Worried About Running Out of Food in the Last Year: Never true    Ran Out of Food in the Last Year: Never true  Transportation Needs: No Transportation Needs (03/04/2023)   PRAPARE - Administrator, Civil Service (Medical): No    Lack of Transportation (Non-Medical): No  Physical Activity: Sufficiently Active (03/04/2023)   Exercise Vital Sign    Days of Exercise per Week: 6 days    Minutes of Exercise per Session: 30 min  Stress: No Stress Concern Present (03/04/2023)   [employer] of Occupational Health - Occupational Stress Questionnaire    Feeling of Stress : Only a little  Social Connections: Moderately Integrated (03/04/2023)   Social Connection and Isolation Panel [NHANES]    Frequency of Communication with Friends and Family: More than three times a week    Frequency of Social Gatherings with Friends and Family: Twice a week     Attends Religious Services: More than 4 times per year    Active Member of [employer] or Organizations: No    Attends Engineer, structural: Never    Marital Status: Married    Tobacco Counseling Counseling given: Not Answered   Clinical Intake:  Pre-visit preparation completed: Yes  Pain : No/denies pain     Diabetes: No  How often do you need to have someone help you when you read instructions, pamphlets, or other written materials from your doctor or pharmacy?: (P) 1 - Never  Interpreter Needed?: No  Information entered by :: Kandis Fantasia LPN   Activities of Daily Living    03/04/2023   12:47 PM 02/05/2023    8:23 AM  In your present state of health, do you have any difficulty performing the following activities:  Hearing? 0   Vision? 0   Difficulty concentrating or making decisions? 0   Walking or climbing stairs? 0   Dressing or bathing? 0   Doing errands, shopping? 0 0  Preparing Food and eating ? N   Using the Toilet? N   In the past six months, have you accidently leaked urine? N   Do you have problems with loss of bowel control? N   Managing your Medications? N   Managing your Finances? N   Housekeeping or managing your Housekeeping? N     Patient Care Team: [name], MD as PCP - General (Family Medicine) Erroll Luna, [facility] (Inactive) as Pharmacist (Pharmacist)  Indicate any recent Medical Services you may have received from other than [facility] providers in the past year (date may be approximate).     Assessment:   This is a routine wellness examination for Loyal.  Hearing/Vision screen Hearing Screening - Comments:: Denies hearing difficulties   Vision Screening - Comments:: Wears rx glasses - up to date with routine eye exams     Goals  Addressed   None   Depression Screen    03/06/2023    2:36 PM 12/20/2022   10:36 AM 12/06/2021   10:37 AM 12/04/2020   10:29 AM 10/27/2020   10:00 AM 12/02/2019   10:36 AM 11/24/2018    9:24  AM  PHQ 2/9 Scores  PHQ - 2 Score 0 0 0 0 0 0 0  PHQ- 9 Score  5 1 1  2      Fall Risk    03/04/2023   12:47 PM 12/20/2022   10:36 AM 12/19/2022   12:09 PM 12/06/2021   10:32 AM 12/04/2020   10:29 AM  Fall Risk   Falls in the past year? 0 0 0 0 0  Number falls in past yr: 0 0 0 0 0  Injury with Fall? 0 0 0 0 0  Risk for fall due to :  No Fall Risks   No Fall Risks  Follow up Falls evaluation completed;Education provided;Falls prevention discussed Falls evaluation completed   Falls evaluation completed    MEDICARE RISK AT HOME: Medicare Risk at Home Any stairs in or around the home?: Yes If so, are there any without handrails?: No Home free of loose throw rugs in walkways, pet beds, electrical cords, etc?: Yes Adequate lighting in your home to reduce risk of falls?: Yes Life alert?: No Use of a cane, walker or w/c?: No Grab bars in the bathroom?: No Shower chair or bench in shower?: Yes Elevated toilet seat or a handicapped toilet?: No  TIMED UP AND GO:  Was the test performed?  No    Cognitive Function:        03/06/2023    2:43 PM  6CIT Screen  What Year? 0 points  What month? 0 points  What time? 0 points  Count back from 20 0 points  Months in reverse 0 points  Repeat phrase 0 points  Total Score 0 points    Immunizations Immunization History  Administered Date(s) Administered   COVID-19, mRNA, vaccine(Comirnaty)12 years and older 04/16/2022   Fluad Quad(high Dose 65+) 03/16/2019   Influenza, High Dose Seasonal PF 04/02/2017, 03/13/2020, 04/09/2021   Influenza,inj,Quad PF,6+ Mos 04/06/2013, 04/06/2014, 04/07/2015, 03/29/2016, 03/10/2018   Influenza-Unspecified 03/17/2012, 04/04/2022   PFIZER(Purple Top)SARS-COV-2 Vaccination 07/07/2019, 07/28/2019, 03/31/2020, 12/11/2020   Pfizer Covid-19 Vaccine Bivalent Booster 77yrs & up 04/09/2021   Pneumococcal Conjugate-13 04/02/2017   Pneumococcal Polysaccharide-23 01/23/2016   Tdap 06/17/2009   Zoster  Recombinant(Shingrix) 04/02/2017, 06/02/2017    TDAP status: Due, Education has been provided regarding the importance of this vaccine. Advised may receive this vaccine at local pharmacy or Health Dept. Aware to provide a copy of the vaccination record if obtained from local pharmacy or Health Dept. Verbalized acceptance and understanding.  Flu Vaccine status: Due, Education has been provided regarding the importance of this vaccine. Advised may receive this vaccine at local pharmacy or Health Dept. Aware to provide a copy of the vaccination record if obtained from local pharmacy or Health Dept. Verbalized acceptance and understanding.  Pneumococcal vaccine status: Up to date  Covid-19 vaccine status: Information provided on how to obtain vaccines.   Qualifies for Shingles Vaccine? Yes   Zostavax completed No   Shingrix Completed?: Yes  Screening Tests Health Maintenance  Topic Date Due   DTaP/Tdap/Td (2 - Td or Tdap) 06/18/2019   COVID-19 Vaccine (7 - 2023-24 season) 02/16/2023   INFLUENZA VACCINE  09/15/2023 (Originally 01/16/2023)   Medicare Annual Wellness (AWV)  03/05/2024   Colonoscopy  12/15/2025   Pneumonia Vaccine 2+ Years old  Completed   Hepatitis C Screening  Completed   Zoster Vaccines- Shingrix  Completed   HPV VACCINES  Aged Out    Health Maintenance  Health Maintenance Due  Topic Date Due   DTaP/Tdap/Td (2 - Td or Tdap) 06/18/2019   COVID-19 Vaccine (7 - 2023-24 season) 02/16/2023    Colorectal cancer screening: Type of screening: Colonoscopy. Completed 12/16/18. Repeat every 7 years  Lung Cancer Screening: (Low Dose CT Chest recommended if Age 36-80 years, 20 pack-year currently smoking OR have quit w/in 15years.) does not qualify.   Lung Cancer Screening Referral: n/a  Additional Screening:  Hepatitis C Screening: does qualify; Completed 12/04/20  Vision Screening: Recommended annual ophthalmology exams for early detection of glaucoma and other disorders  of the eye. Is the patient up to date with their annual eye exam?  Yes  Who is the provider or what is the name of the office in which the patient attends annual eye exams?  If pt is not established with a provider, would they like to be referred to a provider to establish care? No .   Dental Screening: Recommended annual dental exams for proper oral hygiene  Community Resource Referral / Chronic Care Management: CRR required this visit?  No   CCM required this visit?  No     Plan:     I have personally reviewed and noted the following in the patient's chart:   Medical and social history Use of alcohol, tobacco or illicit drugs  Current medications and supplements including opioid prescriptions. Patient is not currently taking opioid prescriptions. Functional ability and status Nutritional status Physical activity Advanced directives List of other physicians Hospitalizations, surgeries, and ER visits in previous 12 months Vitals Screenings to include cognitive, depression, and falls Referrals and appointments  In addition, I have reviewed and discussed with patient certain preventive protocols, quality metrics, and best practice recommendations. A written personalized care plan for preventive services as well as general preventive health recommendations were provided to patient.     Kandis Fantasia [place], [place]   1/61/0960   After Visit Summary: (MyChart) Due to this being a telephonic visit, the after visit summary with patients personalized plan was offered to patient via MyChart   Nurse Notes: No concerns at this time

## 2023-03-08 DIAGNOSIS — I483 Typical atrial flutter: Secondary | ICD-10-CM | POA: Diagnosis not present

## 2023-03-08 DIAGNOSIS — E782 Mixed hyperlipidemia: Secondary | ICD-10-CM | POA: Diagnosis not present

## 2023-03-08 DIAGNOSIS — Z79899 Other long term (current) drug therapy: Secondary | ICD-10-CM | POA: Diagnosis not present

## 2023-03-08 DIAGNOSIS — I429 Cardiomyopathy, unspecified: Secondary | ICD-10-CM | POA: Diagnosis not present

## 2023-03-08 DIAGNOSIS — J479 Bronchiectasis, uncomplicated: Secondary | ICD-10-CM | POA: Diagnosis not present

## 2023-03-08 DIAGNOSIS — Z79890 Hormone replacement therapy (postmenopausal): Secondary | ICD-10-CM | POA: Diagnosis not present

## 2023-03-08 DIAGNOSIS — Z87442 Personal history of urinary calculi: Secondary | ICD-10-CM | POA: Diagnosis not present

## 2023-03-08 DIAGNOSIS — N132 Hydronephrosis with renal and ureteral calculous obstruction: Secondary | ICD-10-CM | POA: Diagnosis not present

## 2023-03-08 DIAGNOSIS — F411 Generalized anxiety disorder: Secondary | ICD-10-CM | POA: Diagnosis not present

## 2023-03-08 DIAGNOSIS — I441 Atrioventricular block, second degree: Secondary | ICD-10-CM | POA: Diagnosis not present

## 2023-03-08 DIAGNOSIS — B9689 Other specified bacterial agents as the cause of diseases classified elsewhere: Secondary | ICD-10-CM | POA: Diagnosis not present

## 2023-03-08 DIAGNOSIS — Z87891 Personal history of nicotine dependence: Secondary | ICD-10-CM | POA: Diagnosis not present

## 2023-03-08 DIAGNOSIS — N136 Pyonephrosis: Secondary | ICD-10-CM | POA: Diagnosis not present

## 2023-03-08 DIAGNOSIS — N401 Enlarged prostate with lower urinary tract symptoms: Secondary | ICD-10-CM | POA: Diagnosis not present

## 2023-03-08 DIAGNOSIS — E039 Hypothyroidism, unspecified: Secondary | ICD-10-CM | POA: Diagnosis not present

## 2023-03-08 DIAGNOSIS — N390 Urinary tract infection, site not specified: Secondary | ICD-10-CM | POA: Diagnosis not present

## 2023-03-08 DIAGNOSIS — D6869 Other thrombophilia: Secondary | ICD-10-CM | POA: Diagnosis not present

## 2023-03-08 DIAGNOSIS — Z7901 Long term (current) use of anticoagulants: Secondary | ICD-10-CM | POA: Diagnosis not present

## 2023-03-08 DIAGNOSIS — I10 Essential (primary) hypertension: Secondary | ICD-10-CM | POA: Diagnosis not present

## 2023-03-08 DIAGNOSIS — Z950 Presence of cardiac pacemaker: Secondary | ICD-10-CM | POA: Diagnosis not present

## 2023-03-09 DIAGNOSIS — Z7901 Long term (current) use of anticoagulants: Secondary | ICD-10-CM | POA: Insufficient documentation

## 2023-03-09 DIAGNOSIS — I10 Essential (primary) hypertension: Secondary | ICD-10-CM | POA: Diagnosis not present

## 2023-03-09 DIAGNOSIS — N132 Hydronephrosis with renal and ureteral calculous obstruction: Secondary | ICD-10-CM | POA: Diagnosis not present

## 2023-03-09 DIAGNOSIS — E782 Mixed hyperlipidemia: Secondary | ICD-10-CM | POA: Insufficient documentation

## 2023-03-09 DIAGNOSIS — E785 Hyperlipidemia, unspecified: Secondary | ICD-10-CM | POA: Diagnosis not present

## 2023-03-09 DIAGNOSIS — E039 Hypothyroidism, unspecified: Secondary | ICD-10-CM | POA: Diagnosis not present

## 2023-03-09 DIAGNOSIS — N200 Calculus of kidney: Secondary | ICD-10-CM | POA: Diagnosis not present

## 2023-03-09 DIAGNOSIS — I441 Atrioventricular block, second degree: Secondary | ICD-10-CM | POA: Insufficient documentation

## 2023-03-09 DIAGNOSIS — N1330 Unspecified hydronephrosis: Secondary | ICD-10-CM | POA: Diagnosis not present

## 2023-03-09 DIAGNOSIS — F411 Generalized anxiety disorder: Secondary | ICD-10-CM | POA: Insufficient documentation

## 2023-03-09 DIAGNOSIS — R310 Gross hematuria: Secondary | ICD-10-CM | POA: Diagnosis not present

## 2023-03-10 ENCOUNTER — Other Ambulatory Visit: Payer: Self-pay | Admitting: Urology

## 2023-03-10 ENCOUNTER — Telehealth: Payer: Self-pay | Admitting: Cardiovascular Disease

## 2023-03-10 DIAGNOSIS — R310 Gross hematuria: Secondary | ICD-10-CM | POA: Diagnosis not present

## 2023-03-10 DIAGNOSIS — N200 Calculus of kidney: Secondary | ICD-10-CM | POA: Diagnosis not present

## 2023-03-10 DIAGNOSIS — N1330 Unspecified hydronephrosis: Secondary | ICD-10-CM | POA: Diagnosis not present

## 2023-03-10 NOTE — Telephone Encounter (Signed)
Will route to callback to clarify whether patient is off Eliquis or will be back on/needing to be held for this procedure? If he is back on and needing to be held, will plan to route to pharm.  Patient was just recently seen by Dr. [name] 02/03/23 cleared for unrelated sinus surgery and did well, and also underwent cystoscopy, left retrograde pyelogram, left ureteral stent placement on 03/09/23 at outside hospital. The patient had a general cardiology appointment scheduled next week (day after urologic surgery) to establish care for his congestive heart failure - echo 10/2022 showed EF 45-50%, global HK, moderate mitral regurgitation and atrial shunting. Dr. Morrie Sheldon note mentioned plan for cardiac PET but do not see this got scheduled or completed. Will verify with Dr. [name] OK to proceed with urologic surgery as requested since this has not yet been done. Dr. [name] - Please route response to P CV DIV PREOP (the pre-op pool). Thank you.

## 2023-03-10 NOTE — Telephone Encounter (Signed)
Pre-operative Risk Assessment    Patient Name: Carlos Choi  DOB: [birth year] MRN: [id number]      Request for Surgical Clearance    Procedure:   left ureteroscopy   Date of Surgery:  Clearance 03/18/23                                 Surgeon:  Dr. Bjorn Pippin  Surgeon's Group or Practice Name:  [facility] Phone number:  [phone] ext [phone] Fax number:  [phone]   Type of Clearance Requested:   - Medical    Type of Anesthesia:  General    Additional requests/questions:  Please fax a copy of [phone] to the surgeon's office.  [name]   03/10/2023, 4:11 PM

## 2023-03-11 ENCOUNTER — Encounter (HOSPITAL_COMMUNITY): Payer: Self-pay

## 2023-03-11 ENCOUNTER — Other Ambulatory Visit: Payer: Self-pay

## 2023-03-11 ENCOUNTER — Encounter (HOSPITAL_COMMUNITY)
Admission: RE | Admit: 2023-03-11 | Discharge: 2023-03-11 | Disposition: A | Discharge status: Home / Self Care | Payer: Medicare Other | Source: Ambulatory Visit | Attending: Urology | Admitting: Urology

## 2023-03-11 ENCOUNTER — Encounter: Payer: Self-pay | Admitting: Cardiovascular Disease

## 2023-03-11 HISTORY — DX: Personal history of urinary calculi: Z87.442

## 2023-03-11 HISTORY — DX: Gastro-esophageal reflux disease without esophagitis: K21.9

## 2023-03-11 NOTE — Telephone Encounter (Signed)
Spoke with patient who states that he will be going back on his Eliquis today (03/11/23). Patient states he doesn't know whether or not he needs to stop it for his procedure. I informed him that I would contact [facility] to see if they wanted him to hold the Eliquis prior to the procedure and we would get back to him. Patient verbalized understanding and thanked me for the call.    I have attempted to reach [facility] about holding Eliquis but no answer. I left a message for office to return our call.

## 2023-03-11 NOTE — Progress Notes (Signed)
Anesthesia Chart Review   Case: [id number] Date/Time: 03/18/23 0915   Procedure: CYSTOSCOPY LEFT URETEROSCOPY/HOLMIUM LASER/STENT PLACEMENT (Left) - 1 HR FOR CASE   Anesthesia type: General   Pre-op diagnosis: LEFT PROXIMAL STONE   Location: [facility] ROOM 04 / [facility]   Surgeons: Bjorn Pippin, MD       DISCUSSION:77 y.o. former smoker with h/o HTN, ASD, atrial flutter s/p DCCV, s/p PPM 12/26/2022 (device orders in 03/11/2023 progress note), left proximal stone scheduled for above procedure 03/18/2023 with Dr. Bjorn Pippin.   VS: There were no vitals taken for this visit.  PROVIDERS: [name], MD is PCP    LABS: {CH[facility] AN LABS REVIEWED:112001::"Labs reviewed: Acceptable for surgery."} (all labs ordered are listed, but only abnormal results are displayed)  Labs Reviewed - No data to display   IMAGES:   EKG:   CV: Echo 11/15/2022 1. Left ventricular ejection fraction, by estimation, is 45 to 50%. Left  ventricular ejection fraction by 2D MOD biplane is 47.0 %. The left  ventricle has mildly decreased function. The left ventricle demonstrates  global hypokinesis. The left  ventricular internal cavity size was mildly dilated. Left ventricular  diastolic parameters are indeterminate.   2. Right ventricular systolic function is normal. The right ventricular  size is moderately enlarged. Tricuspid regurgitation signal is inadequate  for assessing PA pressure.   3. Left atrial size was moderately dilated.   4. Right atrial size was severely dilated.   5. The mitral valve is abnormal. Moderate mitral valve regurgitation.   6. The aortic valve is tricuspid. Aortic valve regurgitation is trivial.   7. The inferior vena cava is normal in size with <50% respiratory  variability, suggesting right atrial pressure of 8 mmHg.   8. Evidence of atrial level shunting detected by color flow Doppler.  There is a small secundum atrial septal defect with predominantly left to  right shunting across  the atrial septum.  Past Medical History:  Diagnosis Date   Allergy    Anxiety    Arthritis    mild ankles    ASD (atrial septal defect)    ASD (small secundum ASD with predominantly left-to-right shunting) 11/15/22 TTE   Atrial flutter (HCC)    s/p DCCV 11/04/22   Bronchiectasis (HCC)    Cataract    removed left eye, forming right eye    Diverticulosis    Dysrhythmia    2nd degree AV block, bradycardia s/p PPM 12/26/22; PVCs   GERD (gastroesophageal reflux disease)    Hemorrhoids    History of kidney stones    Hypertension    Hypothyroid    Presence of permanent cardiac pacemaker 12/26/2022   St. Jude/Abbott   Rectal bleeding    due to hemorrhoid    Tubular adenoma of colon 2015    Past Surgical History:  Procedure Laterality Date   CARDIOVERSION N/A 11/14/2022   Procedure: CARDIOVERSION;  Surgeon: Jake Bathe, MD;  [facility]: [facility];  Service: Cardiovascular;  Laterality: N/A;   CATARACT EXTRACTION Left    COLONOSCOPY     HERNIA REPAIR Bilateral    Over ten years ago per patient. He was not sure of the date.   NASAL SINUS SURGERY Left 02/05/2023   Procedure: ENDOSCOPIC MAXILLARY ANTROSTOMY WITH TISSUE REMOVAL;  Surgeon: Serena Colonel, MD;  Location: [facility] OR;  Service: ENT;  Laterality: Left;   PACEMAKER IMPLANT N/A 12/26/2022   Procedure: PACEMAKER IMPLANT;  Surgeon: Maurice Small, MD;  Location: [facility]  INVASIVE CV LAB;  Service: Cardiovascular;  Laterality: N/A;   POLYPECTOMY     ROTATOR CUFF REPAIR     both shoulders - patient does not remember the exact date    MEDICATIONS:  acetaminophen (TYLENOL) 650 MG CR tablet   ALPRAZolam (XANAX) 0.25 MG tablet   apixaban (ELIQUIS) 5 MG TABS tablet   atorvastatin (LIPITOR) 10 MG tablet   cefpodoxime (VANTIN) 200 MG tablet   Guaifenesin (MUCINEX MAXIMUM STRENGTH) 1200 MG TB12   hyoscyamine (LEVSIN) 0.125 MG tablet   ipratropium (ATROVENT) 0.03 % nasal spray   Lactobacillus-Inulin (CULTURELLE DIGESTIVE HEALTH PO)    levothyroxine (SYNTHROID) 100 MCG tablet   loratadine (CLARITIN) 10 MG tablet   losartan (COZAAR) 25 MG tablet   metoprolol succinate (TOPROL XL) 25 MG 24 hr tablet   MOMETASONE FURO & DIMETHICONE EX   Multiple Vitamins-Minerals (CENTRUM SILVER PO)   Omega-3 Fatty Acids (FISH OIL) 1000 MG CAPS   oxybutynin (DITROPAN) 5 MG tablet   PSYLLIUM PO   silodosin (RAPAFLO) 8 MG CAPS capsule   Sod Bicarb-K Bicarb-Citric Acd (ALKA-SELTZER GOLD) [phone] MG TBEF   No current facility-administered medications for this encounter.

## 2023-03-11 NOTE — Progress Notes (Addendum)
Anesthesia Review:  PCP: Lynnea Ferrier  Cardiologist : DR Mealor- EP LOV 02/03/23  Appt with DR Epifanio Lesches on 03/20/2023.   Pacemaker implanted on 12/26/22  Device check 01/09/23  Device orders on front of chart  Device orders requested on 03/11/23.  Cardioversion- 11/14/2022  Chest x-ray : PFT- 2022  EKG : Echo : 11/15/22  EP Study- 11/14/22  Stress test: Cardiac Cath :  Activity level:  Sleep Study/ CPAP : Fasting Blood Sugar :      / Checks Blood Sugar -- times a day:   Blood Thinner/ Instructions /Last Dose: ASA / Instructions/ Last Dose :    Release on 03/10/23 from [facility]  Restarted Eliquis on 03/11/23 .  To call DR Mealor office on 03/11/23 in regards to Eliquis and procedure on 03/18/2023.    CBC and BMP done 03/10/23 with PLTC of 109.  03/08/23- In ED with UTI 03/09/23 Stent removal 02/05/23- Sinus Surgery     CBC done 03/10/23 routed to DR [facility] on 03/11/23.    Preop phone call completed on 03/11/23.  Medical hx and preop instructions completed.  Wife and pt both present on phone.  All questions answered and voiced understanding.  PT and wife both aware to contact Admitting as soon as hang up phone at [phone] or [phone].  Both wife and pt voiced understanding.,  They are also aware they are to call DR Mealor office in regards to Eliquis preop instructions.  Again, voiced understanding.    Pt called back on 03/11/23 to stated he had spoken with Admitting on 03/11/23 and awaiting a call back from Dr [name] office.   PT also asked if he could take Xanax am of procedure since he has prescribed prn.  Proep nurse informed pt that he could take am of procedure.  Pt and wife voiced understanding.

## 2023-03-11 NOTE — Telephone Encounter (Signed)
Awaiting response from Dr. Lia Foyer.   No eliquis hold required from Urology.

## 2023-03-11 NOTE — Progress Notes (Addendum)
PERIOPERATIVE PRESCRIPTION FOR IMPLANTED CARDIAC DEVICE PROGRAMMING  Patient Information: Name:  Carlos Choi  DOB:  12-18-1945  MRN:  [id number]    Planned Procedure: cystoscopy, holmium laser , stent placement  Surgeon:  Dr Annabell Howells  Date of Procedure:  03/18/2023   Cautery will be used.  Position during surgery:   Please send documentation back to:  [facility] (Fax # [phone])  Device Information:  Clinic EP Physician:  Dr. York Pellant   Device Type:  Pacemaker Manufacturer and Phone #:  [employer]. Jude/[employer]: [phone] Pacemaker Dependent?:  No. Date of Last Device Check:  02/03/2023 Normal Device Function?:  Yes.    Electrophysiologist's Recommendations:  Have magnet available. Provide continuous ECG monitoring when magnet is used or reprogramming is to be performed.  Procedure should not interfere with device function.  No device programming or magnet placement needed.  Per Device Clinic Standing Orders, [name], RN  3:47 PM 03/11/2023

## 2023-03-11 NOTE — Telephone Encounter (Signed)
Pam from [facility] returned my call and stated that patient did not need to be off his Eliquis for his procedure on 10/1.

## 2023-03-11 NOTE — Patient Instructions (Addendum)
SURGICAL WAITING ROOM VISITATION  Patients having surgery or a procedure may have no more than 2 support people in the waiting area - these visitors may rotate.    Children under the age of 66 must have an adult with them who is not the patient.  Due to an increase in RSV and influenza rates and associated hospitalizations, children ages 86 and under may not visit patients in [facility] hospitals.  If the patient needs to stay at the hospital during part of their recovery, the visitor guidelines for inpatient rooms apply. Pre-op nurse will coordinate an appropriate time for 1 support person to accompany patient in pre-op.  This support person may not rotate.    Please refer to the [facility] website for the visitor guidelines for Inpatients (after your surgery is over and you are in a regular room).       Your procedure is scheduled on:    Report to [facility] Main Entrance    Report to admitting at   0730AM   Call this number if you have problems the morning of surgery [phone]   Do not eat food or drink liquids :After Midnight.                               If you have questions, please contact your surgeon's office.       Oral Hygiene is also important to reduce your risk of infection.                                    Remember - BRUSH YOUR TEETH THE MORNING OF SURGERY WITH YOUR REGULAR TOOTHPASTE  DENTURES WILL BE REMOVED PRIOR TO SURGERY PLEASE DO NOT APPLY "Poly grip" OR ADHESIVES!!!   Do NOT smoke after Midnight   Stop all vitamins and herbal supplements 7 days before surgery.   Take these medicines the morning of surgery with A SIP OF WATER: synthroid, claritin, toprol   May take prn Xanax   DO NOT TAKE ANY ORAL DIABETIC MEDICATIONS DAY OF YOUR SURGERY  Bring CPAP mask and tubing day of surgery.                              You may not have any metal on your body including hair pins, jewelry, and body piercing             Do not wear  make-up, lotions, powders, perfumes/cologne, or deodorant  Do not wear nail polish including gel and S&S, artificial/acrylic nails, or any other type of covering on natural nails including finger and toenails. If you have artificial nails, gel coating, etc. that needs to be removed by a nail salon please have this removed prior to surgery or surgery may need to be canceled/ delayed if the surgeon/ anesthesia feels like they are unable to be safely monitored.   Do not shave  48 hours prior to surgery.               Men may shave face and neck.   Do not bring valuables to the hospital.  IS NOT             RESPONSIBLE   FOR VALUABLES.   Contacts, glasses, dentures or bridgework may not be worn into surgery.   Bring  small overnight bag day of surgery.   DO NOT BRING YOUR HOME MEDICATIONS TO THE HOSPITAL. PHARMACY WILL DISPENSE MEDICATIONS LISTED ON YOUR MEDICATION LIST TO YOU DURING YOUR ADMISSION IN THE HOSPITAL!    Patients discharged on the day of surgery will not be allowed to drive home.  Someone NEEDS to stay with you for the first 24 hours after anesthesia.   Special Instructions: Bring a copy of your healthcare power of attorney and living will documents the day of surgery if you haven't scanned them before.              Please read over the following fact sheets you were given: IF YOU HAVE QUESTIONS ABOUT YOUR PRE-OP INSTRUCTIONS PLEASE CALL [phone]   If you received a COVID test during your pre-op visit  it is requested that you wear a mask when out in public, stay away from anyone that may not be feeling well and notify your surgeon if you develop symptoms. If you test positive for Covid or have been in contact with anyone that has tested positive in the last 10 days please notify you surgeon.    [facility] - Preparing for Surgery Before surgery, you can play an important role.  Because skin is not sterile, your skin needs to be as free of germs as possible.  You  can reduce the number of germs on your skin by washing with CHG (chlorahexidine gluconate) soap before surgery.  CHG is an antiseptic cleaner which kills germs and bonds with the skin to continue killing germs even after washing. Please DO NOT use if you have an allergy to CHG or antibacterial soaps.  If your skin becomes reddened/irritated stop using the CHG and inform your nurse when you arrive at Short Stay. Do not shave (including legs and underarms) for at least 48 hours prior to the first CHG shower.  You may shave your face/neck. Please follow these instructions carefully:  1.  Shower with CHG Soap the night before surgery and the  morning of Surgery.  2.  If you choose to wash your hair, wash your hair first as usual with your  normal  shampoo.  3.  After you shampoo, rinse your hair and body thoroughly to remove the  shampoo.                           4.  Use CHG as you would any other liquid soap.  You can apply chg directly  to the skin and wash                       Gently with a scrungie or clean washcloth.  5.  Apply the CHG Soap to your body ONLY FROM THE NECK DOWN.   Do not use on face/ open                           Wound or open sores. Avoid contact with eyes, ears mouth and genitals (private parts).                       Wash face,  Genitals (private parts) with your normal soap.             6.  Wash thoroughly, paying special attention to the area where your surgery  will be performed.  7.  Thoroughly rinse your body with  warm water from the neck down.  8.  DO NOT shower/wash with your normal soap after using and rinsing off  the CHG Soap.                9.  Pat yourself dry with a clean towel.            10.  Wear clean pajamas.            11.  Place clean sheets on your bed the night of your first shower and do not  sleep with pets. Day of Surgery : Do not apply any lotions/deodorants the morning of surgery.  Please wear clean clothes to the hospital/surgery center.  FAILURE  TO FOLLOW THESE INSTRUCTIONS MAY RESULT IN THE CANCELLATION OF YOUR SURGERY PATIENT SIGNATURE_________________________________  NURSE SIGNATURE__________________________________  ________________________________________________________________________

## 2023-03-12 NOTE — Telephone Encounter (Signed)
     Primary Cardiologist: Dr. [name]  Chart reviewed as part of pre-operative protocol coverage. Given past medical history and time since last visit, based on ACC/AHA guidelines, Carlos Choi would be at acceptable risk for the planned procedure without further cardiovascular testing.   No eliquis hold required per urology.   I will route this recommendation to the requesting party via Epic fax function and remove from pre-op pool.  Please call with questions.  Thomasene Ripple. Jaclyn Andy NP-C     03/12/2023, 1:53 PM [facility] [address] Office [phone] Fax [phone]'

## 2023-03-12 NOTE — Telephone Encounter (Signed)
Will keep f/u with Dr. Bjorn Pippin as planned as well

## 2023-03-13 NOTE — Anesthesia Preprocedure Evaluation (Addendum)
Anesthesia Evaluation  Patient identified by MRN, date of birth, ID band Patient awake    Reviewed: Allergy & Precautions, NPO status , Patient's Chart, lab work & pertinent test results  Airway Mallampati: II  TM Distance: >3 FB Neck ROM: Full    Dental no notable dental hx.    Pulmonary neg pulmonary ROS, former smoker   Pulmonary exam normal        Cardiovascular hypertension, Pt. on medications and Pt. on home beta blockers + dysrhythmias Atrial Fibrillation + pacemaker  Rhythm:Regular Rate:Normal  CV: Echo 11/15/2022 1. Left ventricular ejection fraction, by estimation, is 45 to 50%. Left  ventricular ejection fraction by 2D MOD biplane is 47.0 %. The left  ventricle has mildly decreased function. The left ventricle demonstrates  global hypokinesis. The left  ventricular internal cavity size was mildly dilated. Left ventricular  diastolic parameters are indeterminate.   2. Right ventricular systolic function is normal. The right ventricular  size is moderately enlarged. Tricuspid regurgitation signal is inadequate  for assessing PA pressure.   3. Left atrial size was moderately dilated.   4. Right atrial size was severely dilated.   5. The mitral valve is abnormal. Moderate mitral valve regurgitation.   6. The aortic valve is tricuspid. Aortic valve regurgitation is trivial.   7. The inferior vena cava is normal in size with <50% respiratory  variability, suggesting right atrial pressure of 8 mmHg.   8. Evidence of atrial level shunting detected by color flow Doppler.  There is a small secundum atrial septal defect with predominantly left to  right shunting across the atrial septum.       Neuro/Psych   Anxiety     negative neurological ROS     GI/Hepatic Neg liver ROS,GERD  ,,  Endo/Other  Hypothyroidism    Renal/GU negative Renal ROS  negative genitourinary   Musculoskeletal  (+) Arthritis , Osteoarthritis,     Abdominal Normal abdominal exam  (+)   Peds  Hematology negative hematology ROS (+)   Anesthesia Other Findings   Reproductive/Obstetrics                             Anesthesia Physical Anesthesia Plan  ASA: 3  Anesthesia Plan: General   Post-op Pain Management:    Induction: Intravenous  PONV Risk Score and Plan: 2 and Ondansetron, Dexamethasone and Treatment may vary due to age or medical condition  Airway Management Planned: Mask and LMA  Additional Equipment: None  Intra-op Plan:   Post-operative Plan: Extubation in OR  Informed Consent: I have reviewed the patients History and Physical, chart, labs and discussed the procedure including the risks, benefits and alternatives for the proposed anesthesia with the patient or authorized representative who has indicated his/her understanding and acceptance.     Dental advisory given  Plan Discussed with: CRNA  Anesthesia Plan Comments: (See PAT note 03/11/2023)       Anesthesia Quick Evaluation

## 2023-03-17 ENCOUNTER — Telehealth: Payer: Self-pay | Admitting: Cardiovascular Disease

## 2023-03-17 NOTE — Telephone Encounter (Signed)
Patient states that he is having surgery for his kidney stone and will be having the stint remove. He calling to speak with the dr in regards to this. Please advise

## 2023-03-17 NOTE — Telephone Encounter (Signed)
Spoke with the patient who wanted to make [place] aware of his procedure tomorrow. He has an appointment with Dr. Bjorn Pippin scheduled for Thursday. He is planning on making that appointment but if for some reason he is unable then he will let us know.

## 2023-03-18 ENCOUNTER — Ambulatory Visit (HOSPITAL_COMMUNITY): Payer: Medicare Other

## 2023-03-18 ENCOUNTER — Encounter (HOSPITAL_COMMUNITY)
Admission: RE | Disposition: A | Discharge status: Home / Self Care | Payer: Self-pay | Source: Ambulatory Visit | Attending: Urology

## 2023-03-18 ENCOUNTER — Ambulatory Visit (HOSPITAL_BASED_OUTPATIENT_CLINIC_OR_DEPARTMENT_OTHER): Payer: Self-pay

## 2023-03-18 ENCOUNTER — Ambulatory Visit (HOSPITAL_COMMUNITY): Payer: Medicare Other | Admitting: Physician Assistant

## 2023-03-18 ENCOUNTER — Ambulatory Visit (HOSPITAL_COMMUNITY)
Admission: RE | Admit: 2023-03-18 | Discharge: 2023-03-18 | Disposition: A | Discharge status: Home / Self Care | Payer: Medicare Other | Source: Ambulatory Visit | Attending: Urology | Admitting: Urology

## 2023-03-18 ENCOUNTER — Encounter (HOSPITAL_COMMUNITY): Payer: Self-pay | Admitting: Urology

## 2023-03-18 ENCOUNTER — Other Ambulatory Visit: Payer: Self-pay | Admitting: Family Medicine

## 2023-03-18 DIAGNOSIS — N529 Male erectile dysfunction, unspecified: Secondary | ICD-10-CM | POA: Diagnosis not present

## 2023-03-18 DIAGNOSIS — Z87891 Personal history of nicotine dependence: Secondary | ICD-10-CM | POA: Diagnosis not present

## 2023-03-18 DIAGNOSIS — J479 Bronchiectasis, uncomplicated: Secondary | ICD-10-CM | POA: Insufficient documentation

## 2023-03-18 DIAGNOSIS — I4891 Unspecified atrial fibrillation: Secondary | ICD-10-CM | POA: Insufficient documentation

## 2023-03-18 DIAGNOSIS — N200 Calculus of kidney: Secondary | ICD-10-CM | POA: Diagnosis not present

## 2023-03-18 DIAGNOSIS — N320 Bladder-neck obstruction: Secondary | ICD-10-CM | POA: Insufficient documentation

## 2023-03-18 DIAGNOSIS — R310 Gross hematuria: Secondary | ICD-10-CM | POA: Insufficient documentation

## 2023-03-18 DIAGNOSIS — N202 Calculus of kidney with calculus of ureter: Secondary | ICD-10-CM | POA: Insufficient documentation

## 2023-03-18 DIAGNOSIS — E039 Hypothyroidism, unspecified: Secondary | ICD-10-CM | POA: Diagnosis not present

## 2023-03-18 DIAGNOSIS — Z7901 Long term (current) use of anticoagulants: Secondary | ICD-10-CM | POA: Diagnosis not present

## 2023-03-18 DIAGNOSIS — I10 Essential (primary) hypertension: Secondary | ICD-10-CM | POA: Diagnosis not present

## 2023-03-18 DIAGNOSIS — Z950 Presence of cardiac pacemaker: Secondary | ICD-10-CM | POA: Diagnosis not present

## 2023-03-18 DIAGNOSIS — N401 Enlarged prostate with lower urinary tract symptoms: Secondary | ICD-10-CM | POA: Diagnosis not present

## 2023-03-18 DIAGNOSIS — N201 Calculus of ureter: Secondary | ICD-10-CM

## 2023-03-18 DIAGNOSIS — N132 Hydronephrosis with renal and ureteral calculous obstruction: Secondary | ICD-10-CM | POA: Diagnosis not present

## 2023-03-18 DIAGNOSIS — Z23 Encounter for immunization: Secondary | ICD-10-CM | POA: Diagnosis not present

## 2023-03-18 DIAGNOSIS — R7303 Prediabetes: Secondary | ICD-10-CM | POA: Diagnosis not present

## 2023-03-18 DIAGNOSIS — E7800 Pure hypercholesterolemia, unspecified: Secondary | ICD-10-CM | POA: Diagnosis not present

## 2023-03-18 DIAGNOSIS — H93A9 Pulsatile tinnitus, unspecified ear: Secondary | ICD-10-CM | POA: Diagnosis not present

## 2023-03-18 HISTORY — PX: CYSTOSCOPY/URETEROSCOPY/HOLMIUM LASER/STENT PLACEMENT: SHX6546

## 2023-03-18 SURGERY — CYSTOSCOPY/URETEROSCOPY/HOLMIUM LASER/STENT PLACEMENT
Anesthesia: General | Laterality: Left

## 2023-03-18 MED ORDER — ONDANSETRON HCL 4 MG/2ML IJ SOLN
INTRAMUSCULAR | Status: DC | PRN
  Administered 2023-03-18: 4 mg via INTRAVENOUS

## 2023-03-18 MED ORDER — SODIUM CHLORIDE 0.9 % IR SOLN
Status: DC | PRN
  Administered 2023-03-18: 3000 mL via INTRAVESICAL
  Administered 2023-03-18: 1000 mL via INTRAVESICAL

## 2023-03-18 MED ORDER — ACETAMINOPHEN 10 MG/ML IV SOLN: 1000.0000 mg | Freq: Once | INTRAVENOUS | Status: DC | PRN

## 2023-03-18 MED ORDER — PROPOFOL 10 MG/ML IV BOLUS
INTRAVENOUS | Status: DC | PRN
  Administered 2023-03-18: 100 mg via INTRAVENOUS

## 2023-03-18 MED ORDER — LIDOCAINE HCL (CARDIAC) PF 100 MG/5ML IV SOSY
PREFILLED_SYRINGE | INTRAVENOUS | Status: DC | PRN
  Administered 2023-03-18: 60 mg via INTRAVENOUS

## 2023-03-18 MED ORDER — ORAL CARE MOUTH RINSE: 15.0000 mL | Freq: Once | OROMUCOSAL | Status: AC

## 2023-03-18 MED ORDER — LIDOCAINE HCL (PF) 2 % IJ SOLN
INTRAMUSCULAR | Status: AC
  Filled 2023-03-18: qty 5

## 2023-03-18 MED ORDER — PHENYLEPHRINE 80 MCG/ML (10ML) SYRINGE FOR IV PUSH (FOR BLOOD PRESSURE SUPPORT)
PREFILLED_SYRINGE | INTRAVENOUS | Status: AC
  Filled 2023-03-18: qty 10

## 2023-03-18 MED ORDER — ACETAMINOPHEN 325 MG PO TABS: 650.0000 mg | ORAL_TABLET | ORAL | Status: DC | PRN

## 2023-03-18 MED ORDER — SODIUM CHLORIDE 0.9% FLUSH: 3.0000 mL | Freq: Two times a day (BID) | INTRAVENOUS | Status: DC

## 2023-03-18 MED ORDER — CEFAZOLIN SODIUM-DEXTROSE 2-4 GM/100ML-% IV SOLN
2.0000 g | INTRAVENOUS | Status: AC
  Administered 2023-03-18: 2 g via INTRAVENOUS
  Filled 2023-03-18: qty 100

## 2023-03-18 MED ORDER — FENTANYL CITRATE PF 50 MCG/ML IJ SOSY: 25.0000 ug | PREFILLED_SYRINGE | INTRAMUSCULAR | Status: DC | PRN

## 2023-03-18 MED ORDER — FENTANYL CITRATE (PF) 100 MCG/2ML IJ SOLN
INTRAMUSCULAR | Status: AC
  Filled 2023-03-18: qty 2

## 2023-03-18 MED ORDER — ONDANSETRON HCL 4 MG/2ML IJ SOLN
INTRAMUSCULAR | Status: AC
  Filled 2023-03-18: qty 2

## 2023-03-18 MED ORDER — PHENYLEPHRINE 80 MCG/ML (10ML) SYRINGE FOR IV PUSH (FOR BLOOD PRESSURE SUPPORT)
PREFILLED_SYRINGE | INTRAVENOUS | Status: DC | PRN
Start: 2023-03-18 — End: 2023-03-18
  Administered 2023-03-18 (×2): 40 ug via INTRAVENOUS

## 2023-03-18 MED ORDER — LACTATED RINGERS IV SOLN: INTRAVENOUS | Status: DC

## 2023-03-18 MED ORDER — MIDAZOLAM HCL 2 MG/2ML IJ SOLN
INTRAMUSCULAR | Status: AC
  Filled 2023-03-18: qty 2

## 2023-03-18 MED ORDER — CHLORHEXIDINE GLUCONATE 0.12 % MT SOLN
15.0000 mL | Freq: Once | OROMUCOSAL | Status: AC
  Administered 2023-03-18: 15 mL via OROMUCOSAL

## 2023-03-18 MED ORDER — DEXAMETHASONE SODIUM PHOSPHATE 10 MG/ML IJ SOLN
INTRAMUSCULAR | Status: DC | PRN
  Administered 2023-03-18: 8 mg via INTRAVENOUS

## 2023-03-18 MED ORDER — DEXAMETHASONE SODIUM PHOSPHATE 10 MG/ML IJ SOLN
INTRAMUSCULAR | Status: AC
  Filled 2023-03-18: qty 1

## 2023-03-18 MED ORDER — FENTANYL CITRATE (PF) 100 MCG/2ML IJ SOLN
INTRAMUSCULAR | Status: DC | PRN
  Administered 2023-03-18: 50 ug via INTRAVENOUS

## 2023-03-18 MED ORDER — ACETAMINOPHEN 650 MG RE SUPP: 650.0000 mg | RECTAL | Status: DC | PRN

## 2023-03-18 MED ORDER — SODIUM CHLORIDE 0.9% FLUSH: 3.0000 mL | INTRAVENOUS | Status: DC | PRN

## 2023-03-18 MED ORDER — MIDAZOLAM HCL 5 MG/5ML IJ SOLN
INTRAMUSCULAR | Status: DC | PRN
  Administered 2023-03-18: 1 mg via INTRAVENOUS

## 2023-03-18 MED ORDER — PHENYLEPHRINE HCL (PRESSORS) 10 MG/ML IV SOLN
INTRAVENOUS | Status: DC | PRN
Start: 2023-03-18 — End: 2023-03-18
  Administered 2023-03-18: 120 ug via INTRAVENOUS
  Administered 2023-03-18 (×3): 80 ug via INTRAVENOUS
  Administered 2023-03-18: 120 ug via INTRAVENOUS
  Administered 2023-03-18: 80 ug via INTRAVENOUS

## 2023-03-18 MED ORDER — SODIUM CHLORIDE 0.9 % IV SOLN: 250.0000 mL | INTRAVENOUS | Status: DC | PRN

## 2023-03-18 MED ORDER — OXYCODONE HCL 5 MG PO TABS: 5.0000 mg | ORAL_TABLET | ORAL | Status: DC | PRN

## 2023-03-18 MED ORDER — MORPHINE SULFATE (PF) 2 MG/ML IV SOLN: 2.0000 mg | INTRAVENOUS | Status: DC | PRN

## 2023-03-18 SURGICAL SUPPLY — 24 items
BAG URO CATCHER STRL LF (MISCELLANEOUS) ×1 IMPLANT
BASKET STONE NCOMPASS (UROLOGICAL SUPPLIES) IMPLANT
CATH URETERAL DUAL LUMEN 10F (MISCELLANEOUS) IMPLANT
CATH URETL OPEN 5X70 (CATHETERS) IMPLANT
CLOTH BEACON ORANGE TIMEOUT ST (SAFETY) ×1 IMPLANT
EXTRACTOR STONE NITINOL NGAGE (UROLOGICAL SUPPLIES) IMPLANT
GLOVE SURG SS PI 8.0 STRL IVOR (GLOVE) ×1 IMPLANT
GOWN STRL REUS W/ TWL XL LVL3 (GOWN DISPOSABLE) ×1 IMPLANT
GOWN STRL REUS W/TWL XL LVL3 (GOWN DISPOSABLE) ×1
GUIDEWIRE STR DUAL SENSOR (WIRE) ×1 IMPLANT
IV NS IRRIG 3000ML ARTHROMATIC (IV SOLUTION) ×1 IMPLANT
KIT TURNOVER KIT A (KITS) IMPLANT
LASER FIB FLEXIVA PULSE ID 365 (Laser) IMPLANT
LASER FIB FLEXIVA PULSE ID 550 (Laser) IMPLANT
LASER FIB FLEXIVA PULSE ID 910 (Laser) IMPLANT
MANIFOLD NEPTUNE II (INSTRUMENTS) ×1 IMPLANT
PACK CYSTO (CUSTOM PROCEDURE TRAY) ×1 IMPLANT
PAD PREP 24X48 CUFFED NSTRL (MISCELLANEOUS) ×1 IMPLANT
SHEATH NAVIGATOR HD 11/13X36 (SHEATH) IMPLANT
STENT URET 6FRX26 CONTOUR (STENTS) IMPLANT
TRACTIP FLEXIVA PULS ID 200XHI (Laser) IMPLANT
TRACTIP FLEXIVA PULSE ID 200 (Laser)
TUBING CONNECTING 10 (TUBING) ×1 IMPLANT
TUBING UROLOGY SET (TUBING) ×1 IMPLANT

## 2023-03-18 NOTE — Transfer of Care (Signed)
Immediate Anesthesia Transfer of Care Note  Patient: Carlos Choi  Procedure(s) Performed: CYSTOSCOPY LEFT URETEROSCOPY/STENT PLACEMENT (Left)  Patient Location: PACU  Anesthesia Type:General  Level of Consciousness: awake and alert   Airway & Oxygen Therapy: Patient Spontanous Breathing  Post-op Assessment: Report given to RN and Post -op Vital signs reviewed and stable  Post vital signs: Reviewed and stable  Last Vitals:  Vitals Value Taken Time  BP 117/76 03/18/23 0957  Temp    Pulse 63 03/18/23 0959  Resp 18 03/18/23 0959  SpO2 100 % 03/18/23 0959  Vitals shown include unfiled device data.  Last Pain:  Vitals:   03/18/23 0742  TempSrc: Oral  PainSc: 0-No pain         Complications: No notable events documented.

## 2023-03-18 NOTE — Discharge Instructions (Signed)
You may remove the stent on Friday morning by pulling the attached string.   If the bleeding persists, please hold the Eliquis for 2 days.

## 2023-03-18 NOTE — H&P (Signed)
BPH  HPI: Carlos Choi is a 77 year-old male established patient who is here for follow up regarding further evaluation of BPH and lower urinary tract symptoms.  His symptoms have been worse over the last year. The patient complains of lower urinary tract symptom(s) that include frequency, urgency, and nocturia. The patient states his most bothersome symptom(s) are the following: nocturia. Patient is currently treated with Silodosin 8mg  for his symptoms. The patient states if he were to spend the rest of his life with his current urinary condition, he would be mixed. He denies any other associated symptoms.   03/18/23: Carlos Choi had a left ureteral stent placed in early September and returns now for stone removal.   02/19/23: Carlos Choi returns today in f/u for his history of BPH with BOO on silodosin 8mg  daily. he was seen in July with some increased irritative symptoms but his UA was clear and his PVR was only 1ml. He had a pacemaker placed for the Afib and is doing well with that with an improved heart rate. He is voiding well with an IPSS of 6 with nocturia x 1. He can have urgency with some flatus and BM as well. His PSA is stable at 2.05.   12/16/2022: Pt with below noted hx. On silodosin 8mg  for management of underlying BPH symptoms. Seen today as an acute visit due to worsening symptomology. In May he was diagnosed with a second-degree heart block with A-fib/a flutter. He underwent a cardioversion but is now scheduled to undergo a pacemaker placement next week. He has some expected underlying anxiety in relation to this. He remains on silodosin 8 mg daily. He has noted over the past couple months he has been having more increased urinary urgency during the daytime as well as increased nocturia 2-3 times nightly. Denies any significant changes in FOS, dysuria or gross hematuria. PVR <1oz. IPSS is 11. He is on Eliquis now. Not on any type of diuretic therapy.   02/13/22: Carlos Choi returns today in f/u to  assess his response to silodosin. He reports a dramatic improvement with a better flow and easier voiding with a BM. His IPSS is down to 8 from 13. His PVR is and a flowrate with a volume of had a PF of 14 ml/sec with some terminal intermittency. He has had no associated signs or symptoms.   01/23/22: Carlos Choi returns today with a progressively slowing stream since the beginning of the year. It is worse in the morning. He has some frequency and intermittency but nocturia only x 1. He has an occasional sensation of incomplete emptying. He has had no hematuria or dysuria. He has occasional right low back pain. his IPSS is 13. His PVR is today. He has tried Viagra in the past but is functioning without it now.   GU Hx: Carlos Choi is a former patient of Dr. Isabel Caprice, Dr. Margarita Grizzle and Dr. Aldean Ast.   He has had 2 negative prostate biopsies in the past in 2001 in 2013. His PSA is 2.1 in 6/20 which is stable.   He has a history of ED. He has not used Viagra recently and can perform with some effort.   He has BPH with BOO and has stable LUTS with some mild frequency with nocturia x 1 and a mild reduction in the stream. His IPSS is 8. He has no other associated signs or symptoms. He has been diagnosed with bronchiectasis and thinks he was waking at night to clear his throat. He  has started taking mucinex.   He did develop urinary retention in the past after hemorrhoid surgery. There apparently was significant difficulty placing a Foley catheter at the surgical center. He developed some gross hematuria and eventually had to be transported to [facility] long emergency room. There a more experienced nurse was able to place a catheter and a large amount of urine was obtained.      AUA Symptom Score: Less than 20% of the time he has the sensation of not emptying his bladder completely when finished urinating. Less than 50% of the time he has to urinate again fewer than two hours after he has finished  urinating. Less than 20% of the time he has to start and stop again several times when he urinates. Less than 20% of the time he finds it difficult to postpone urination. 50% of the time he has a weak urinary stream. Less than 20% of the time he has to push or strain to begin urination. He has to get up to urinate 2 times from the time he goes to bed until the time he gets up in the morning.   Calculated AUA Symptom Score: 11    ALLERGIES: No Allergies    MEDICATIONS: Levothyroxine Sodium 100 mcg tablet  Metoprolol Succinate 25 mg tablet, extended release 24 hr  Acetaminophen  Alka Seltzer  Alprazolam  Atorvastatin Calcium  Centrum Silver  Citrucel  Claritin CAPS Oral  Eliquis 5 mg tablet  Fish Oil  Hyoscyamine Sulfate 0.125 mg tablet  Ipratropium Bromide  Lactobacillus  Losartan Potassium 25 mg tablet  Mucinex  Multi-Day Vitamins TABS Oral  Probiotic CAPS Oral  Silodosin 8 mg capsule 1 capsule PO Daily     GU PSH: Complex Uroflow - 02/13/2022       PSH Notes: Shoulder Surgery, Inguinal Hernia Repa,ir, sinus surgery 2024,   NON-GU PSH: Pacemaker placement - 01/19/2023 Visit Complexity (formerly GPC1X) - 12/16/2022     GU PMH: BPH w/LUTS - 12/16/2022, He is very pleased with the response to silodosin so I won't do cystoscopy today and I have refilled the med for a year at 90 days intervals. , - 02/13/2022, He has progressive LUTS with BPH and BOO. I discussed options and will try him on silodosin 8mg  since he has some issues with congestion and the silodosin is more prostate specific. He has a f/u on 8/30 and I will have him do a flowrate, PVR and possible cystoscopy. , - 01/23/2022, He is doing well with stable LUTS. He will return in a year. , - 2022, His symptoms are a bit worse but still fairly mild. I did discuss medical therapy options including tadalafil, alpha blockers and 5ARIs but he doesn't want to try those at this time. , - 2021 (Stable), He has mild LUTs with a benign exam.  , - 2020, He is voiding well but may have some increased frequency. I will just have him return in a year with a PSA. , - 2019, Benign prostatic hyperplasia with urinary obstruction, - 2017 Nocturia - 12/16/2022, - 02/13/2022, - 2022, He has nocturia x 1 which is new to him and bothersome. he has started drinking a daily cup of coffee so I recommended that he stop that and see if it makes a difference. , - 2021 Incomplete bladder emptying, His PVR is down slightly at . - 02/13/2022, PVR is ., - 01/23/2022 Weak Urinary Stream - 02/13/2022, (Stable), - 01/23/2022 (Stable), - 2021, Weak urinary stream, - 2017 History  of urolithiasis, He has had no hematuria or flank pain. - 01/23/2022, Nephrolithiasis, - 2014 ED due to arterial insufficiency, He continues to function without meds. - 2022, Erectile dysfunction due to arterial insufficiency, - 2014 Elevated PSA, His PSA remains normal. - 2022, Elevated prostate specific antigen (PSA), - 2016, PSA,Elevated, - 2014 Urinary Frequency - 2018 Urinary Retention, Unspec, Urinary retention - 2016 Pelvic/perineal pain, Abdominal Pain Above The Pubic Area (Suprapubic) - 2014      PMH Notes:  2010-12-10 09:59:29 - Note: Neoplasm Of The Prostate Gland, cardioversion   NON-GU PMH: Encounter for general adult medical examination without abnormal findings, Encounter for preventive health examination - 2016 Personal history of other diseases of the circulatory system, History of hypertension - 2014 Atrial Fibrillation    FAMILY HISTORY: Alzheimer's Disease - Mother Cervical Cancer - Father Family Health Status Number - Runs In Family Hemorrhagic Stroke - Sister nephrolithiasis - Runs In Family   SOCIAL HISTORY: No Social History     Notes: Former smoker, Death In The Family Father, Death In The Family Mother, Caffeine Use, Alcohol Use, Occupation:, Marital History - Currently Married   REVIEW OF SYSTEMS:    GU Review Male:   Patient denies frequent  urination, hard to postpone urination, burning/ pain with urination, get up at night to urinate, leakage of urine, stream starts and stops, trouble starting your stream, have to strain to urinate , erection problems, and penile pain.  Gastrointestinal (Upper):   Patient denies nausea, vomiting, and indigestion/ heartburn.  Gastrointestinal (Lower):   Patient denies diarrhea and constipation.  Constitutional:   Patient denies fever, night sweats, weight loss, and fatigue.  Skin:   Patient denies skin rash/ lesion and itching.  Eyes:   Patient denies blurred vision and double vision.  Ears/ Nose/ Throat:   Patient denies sore throat and sinus problems.  Hematologic/Lymphatic:   Patient denies swollen glands and easy bruising.  Cardiovascular:   Patient denies leg swelling and chest pains.  Respiratory:   Patient denies cough and shortness of breath.  Endocrine:   Patient denies excessive thirst.  Musculoskeletal:   Patient denies back pain and joint pain.  Neurological:   Patient denies headaches and dizziness.  Psychologic:   Patient denies anxiety and depression.   VITAL SIGNS: None   GU PHYSICAL EXAMINATION:    Anus and Perineum: there is a small area of skin irritation in the upper gluteal crease.    MULTI-SYSTEM PHYSICAL EXAMINATION:       Complexity of Data:  Lab Test Review:   PSA  Records Review:   AUA Symptom Score, Previous Patient Records  Urine Test Review:   Urinalysis   02/10/23 01/23/22 11/20/20 11/25/19 01/20/19 11/10/18 10/17/17 10/02/16  PSA  Total PSA 2.05 ng/mL 1.79 ng/mL 2.1 ng/ml 2.2 ng/dl 9.14 ng/mL 5.1 ng/dl 7.82 ng/mL 9.56 ng/dl    21/30/86  Hormones  Testosterone, Total 5.33    Notes:                     KPN tool reviewed. Cr was 0.94.    PROCEDURES:          Visit Complexity - G2211 Chronic management         Urinalysis w/Scope - 81001 Dipstick Dipstick Cont'd Micro  Color: Yellow Bilirubin: Neg WBC/hpf: 6 - 10/hpf  Appearance: Clear Ketones: Neg  RBC/hpf: NS (Not Seen)  Specific Gravity: 1.025 Blood: Trace Bacteria: Rare (0-9/hpf)  pH: 5.5 Protein: Neg Cystals: NS (Not Seen)  Glucose: Neg Urobilinogen: 0.2 Casts: NS (Not Seen)    Nitrites: Neg Trichomonas: Not Present    Leukocyte Esterase: Neg Mucous: Present      Epithelial Cells: 0 - 5/hpf      Yeast: NS (Not Seen)      Sperm: Not Present    Notes:      ASSESSMENT/PLAN:       ICD-10 Details  Left proximal ureteral stone.   He will have ureteroscopy, stone removal and stent exchange today.

## 2023-03-18 NOTE — Anesthesia Procedure Notes (Signed)
Procedure Name: LMA Insertion Date/Time: 03/18/2023 9:07 AM  Performed by: Sampson Goon, CRNAPre-anesthesia Checklist: Patient identified, Emergency Drugs available, Suction available and Patient being monitored Patient Re-evaluated:Patient Re-evaluated prior to induction Oxygen Delivery Method: Circle System Utilized Preoxygenation: Pre-oxygenation with 100% oxygen Induction Type: IV induction LMA: LMA inserted LMA Size: 4.0 Number of attempts: 1 Airway Equipment and Method: Bite block Placement Confirmation: positive ETCO2 Tube secured with: Tape Dental Injury: Teeth and Oropharynx as per pre-operative assessment

## 2023-03-18 NOTE — Interval H&P Note (Signed)
History and Physical Interval Note:  He has had some intermittent hematuria with the stent and occasional pain.   I reviewed the risks of bleeding, infection, ureteral injury, need for secondary procedure, thrombotic events and anesthetic complications.   03/18/2023 8:48 AM  Carlos Choi  has presented today for surgery, with the diagnosis of LEFT PROXIMAL STONE.  The various methods of treatment have been discussed with the patient and family. After consideration of risks, benefits and other options for treatment, the patient has consented to  Procedure(s) with comments: CYSTOSCOPY LEFT URETEROSCOPY/HOLMIUM LASER/STENT PLACEMENT (Left) - 1 HR FOR CASE as a surgical intervention.  [name] history has been reviewed, patient examined, no change in status, stable for surgery.  I have reviewed the patient's chart and labs.  Questions were answered to the patient's satisfaction.     Bjorn Pippin

## 2023-03-18 NOTE — Anesthesia Postprocedure Evaluation (Signed)
Anesthesia Post Note  Patient: Carlos Choi  Procedure(s) Performed: CYSTOSCOPY LEFT URETEROSCOPY/STENT PLACEMENT (Left)     Patient location during evaluation: PACU Anesthesia Type: General Level of consciousness: awake and alert Pain management: pain level controlled Vital Signs Assessment: post-procedure vital signs reviewed and stable Respiratory status: spontaneous breathing, nonlabored ventilation, respiratory function stable and patient connected to nasal cannula oxygen Cardiovascular status: blood pressure returned to baseline and stable Postop Assessment: no apparent nausea or vomiting Anesthetic complications: no   No notable events documented.  Last Vitals:  Vitals:   03/18/23 1030 03/18/23 1050  BP: 139/80 (!) 150/94  Pulse: 68 (!) 48  Resp: 10   Temp:  (!) 36.4 C  SpO2: 100% 100%    Last Pain:  Vitals:   03/18/23 1050  TempSrc: Oral  PainSc: 0-No pain                 Earl Lites P Chanele Douglas

## 2023-03-18 NOTE — Op Note (Signed)
Procedure: 1.  Cystoscopy with removal and replacement of the left double-J stent. 2.  Left ureteroscopy with stone extraction. 3.  Application of fluoroscopy.  Preop diagnosis: 4 mm left ureteral stone with obstruction.  Postop diagnosis: Multiple small left lower pole renal stones.  Surgeon: Dr. Bjorn Pippin.  Anesthesia: General.  Specimen: Stone.  Drain: 6 [place] by 26 cm left contour double-J stent with tether.  EBL: None.  Complications: None.  Indications: The patient is a 77 year old male who underwent stenting for 4 mm left proximal stone with hematuria and possible infection in [facility] on 9/24.  His urine culture was negative but he remains on antibiotic.  He has had some intermittent hematuria but he is on Eliquis as well.  It was felt that ureteroscopy with stone removal was indicated.  Procedure: He was taken operating room was given Ancef.  A general anesthetic was induced.  He was placed in lithotomy position and fitted with PAS hose.  His perineum and genitalia were prepped with Betadine solution draped in usual sterile fashion.  Cystoscopy was performed with a 21 [place] scope and 30 degree lens.  Examination really normal urethra.  The external sphincter was intact.  The prostatic urethra had trilobar hyperplasia with a large middle lobe with obstruction.  The bladder was poorly visualized because of bloody urine but no obvious lesions were identified.  There was a stent at the left ureteral orifice.  The stent was grasped with a grasping forceps and pulled the urethral meatus and a sensor wire was passed to the kidney under fluoroscopic guidance through the stent which was then removed.  Fluoroscopy suggested a small stone in the lower pole.  A 36 cm 11/13 digital access sheath inner core was then inserted without difficulty to the proximal ureter.  This was followed by the assembled sheath.  The inner core and wire were then removed.  A dual-lumen digital  ureteroscope was then advanced the kidney under visual guidance.  I then inspected the entire calyceal system and in the lower calyx identified several small stones but did not find a 4 mm stone.  The small calcification in the lower pole was not clearly visualized despite entering every calyx and it could be embedded.  I was able to graft 1 small fragment and remove it.  It was only about 1 mm and the remaining stones that were with it were the same size or smaller and should pass spontaneously.  The ureteroscope was then removed over the wire and no stones were noted in the ureter.  The cystoscope was then reinserted over the wire and a 6 [place] by 26 cm contour double-J stent was then advanced left kidney under fluoroscopic guidance.  The wire was removed, leaving good coil in the kidney and a good coil in the bladder.  The stent string was left exiting urethra after the bladder was drained and the cystoscope was removed.  The string was secured to the patient's penis.  He was taken down from the lithotomy position, his anesthetic was reversed and he was moved recovery in stable condition.  There were no complications.

## 2023-03-19 ENCOUNTER — Encounter (HOSPITAL_COMMUNITY): Payer: Self-pay | Admitting: Urology

## 2023-03-19 NOTE — Progress Notes (Signed)
Cardiology Office Note:    Date:  03/20/2023   ID:  Carlos Choi, DOB 03-14-1946, MRN [id number]  PCP:  [name], MD  Cardiologist:  Little Ishikawa, MD  Electrophysiologist:  None   Referring MD: Maurice Small, MD   Chief Complaint  Patient presents with   Congestive Heart Failure    History of Present Illness:    Carlos Choi is a 77 y.o. male with a hx of atrial flutter, ASD, heart block status post PPM, bronchiectasis, hypertension, tobacco use, hypothyroidism who is referred by Dr. [name] for evaluation of heart failure.  He was initially diagnosed with atrial flutter 10/2022.  At that time he was started on Eliquis and underwent DCCV.  Presented with complete heart block and underwent PPM 12/2022.  Echocardiogram 11/15/2022 showed EF 45 to 50%, normal RV function, moderate RV enlargement, moderate left atrial enlargement, severe right atrial enlargement, moderate MR, small secundum ASD with left-to-right shunting.  Denies any chest pain, dyspnea, lightheadedness, syncope, lower extremity edema, or palpitations.  Walks 1.5 miles every day.  Reports having recent issues with kidney stones and had ureteral stent placed.  Hematuria resolving.  Eliquis currently on hold, was told by his urologist he could restart tomorrow.    Past Medical History:  Diagnosis Date   Allergy    Anxiety    Arthritis    mild ankles    ASD (atrial septal defect)    ASD (small secundum ASD with predominantly left-to-right shunting) 11/15/22 TTE   Atrial flutter (HCC)    s/p DCCV 11/04/22   Bronchiectasis (HCC)    Cataract    removed left eye, forming right eye    Diverticulosis    Dysrhythmia    2nd degree AV block, bradycardia s/p PPM 12/26/22; PVCs   GERD (gastroesophageal reflux disease)    Hemorrhoids    History of kidney stones    Hypertension    Hypothyroid    Presence of permanent cardiac pacemaker 12/26/2022   St. Jude/Abbott   Rectal bleeding    due to  hemorrhoid    Tubular adenoma of colon 2015    Past Surgical History:  Procedure Laterality Date   CARDIOVERSION N/A 11/14/2022   Procedure: CARDIOVERSION;  Surgeon: Jake Bathe, MD;  Location: [facility];  Service: Cardiovascular;  Laterality: N/A;   CATARACT EXTRACTION Left    COLONOSCOPY     CYSTOSCOPY/URETEROSCOPY/HOLMIUM LASER/STENT PLACEMENT Left 03/18/2023   Procedure: CYSTOSCOPY LEFT URETEROSCOPY/STENT PLACEMENT;  Surgeon: Bjorn Pippin, MD;  Location: [facility] ORS;  Service: Urology;  Laterality: Left;  1 HR FOR CASE   HERNIA REPAIR Bilateral    Over ten years ago per patient. He was not sure of the date.   NASAL SINUS SURGERY Left 02/05/2023   Procedure: ENDOSCOPIC MAXILLARY ANTROSTOMY WITH TISSUE REMOVAL;  Surgeon: Serena Colonel, MD;  [facility];  Service: ENT;  Laterality: Left;   PACEMAKER IMPLANT N/A 12/26/2022   Procedure: PACEMAKER IMPLANT;  Surgeon: Maurice Small, MD;  Location: [facility];  Service: Cardiovascular;  Laterality: N/A;   POLYPECTOMY     ROTATOR CUFF REPAIR     both shoulders - patient does not remember the exact date    Current Medications: Current Meds  Medication Sig   acetaminophen (TYLENOL) 650 MG CR tablet Take 1,300 mg by mouth every 8 (eight) hours as needed for pain.   ALPRAZolam (XANAX) 0.25 MG tablet Take 1 tablet (0.25 mg total) by mouth daily as  needed for anxiety.   atorvastatin (LIPITOR) 10 MG tablet Take 1 tablet (10 mg total) by mouth daily.   cefpodoxime (VANTIN) 200 MG tablet Take 200 mg by mouth 2 (two) times daily.   HYDROcodone-acetaminophen (NORCO/VICODIN) 5-325 MG tablet Take 1 tablet by mouth every 6 (six) hours as needed for moderate pain.   Lactobacillus-Inulin (CULTURELLE DIGESTIVE HEALTH PO) Take 1 capsule by mouth daily.   levothyroxine (SYNTHROID) 100 MCG tablet Take 1 tablet (100 mcg total) by mouth daily.   loratadine (CLARITIN) 10 MG tablet Take 10 mg by mouth daily.   losartan (COZAAR) 25 MG tablet  Take 1 tablet (25 mg total) by mouth daily.   metoprolol succinate (TOPROL-XL) 50 MG 24 hr tablet Take 1 tablet (50 mg total) by mouth daily. Take with or immediately following a meal.   Multiple Vitamins-Minerals (CENTRUM SILVER PO) Take 1 tablet by mouth daily.   Omega-3 Fatty Acids (FISH OIL) 1000 MG CAPS Take 2,000 mg by mouth daily.   oxybutynin (DITROPAN-XL) 5 MG 24 hr tablet Take 5 mg by mouth 3 (three) times daily as needed.   silodosin (RAPAFLO) 8 MG CAPS capsule Take 8 mg by mouth daily.   Sod Bicarb-K Bicarb-Citric Acd (ALKA-SELTZER GOLD) [phone] 011-1217 MG TBEF Take 2 tablets by mouth daily as needed (Upset stomach).   [DISCONTINUED] metoprolol succinate (TOPROL XL) 25 MG 24 hr tablet Take 1 tablet (25 mg total) by mouth daily.     Allergies:   Patient has no known allergies.   Social History   Socioeconomic History   Marital status: Married    Spouse name: [name].   Number of children: 2   Years of education: Not on file   Highest education level: Not on file  Occupational History   Not on file  Tobacco Use   Smoking status: Former    Current packs/day: 0.00    Average packs/day: 0.5 packs/day for 6.0 years (3.0 ttl pk-yrs)    Types: Cigarettes    Start date: 06/17/1973    Quit date: 06/18/1979    Years since quitting: 43.7   Smokeless tobacco: Never  Vaping Use   Vaping status: Never Used  Substance and Sexual Activity   Alcohol use: Yes    Comment: 1-2 beers Monthly.   Drug use: No   Sexual activity: Not on file  Other Topics Concern   Not on file  Social History Narrative   Not on file   Social Determinants of Health   Financial Resource Strain: Low Risk  (03/04/2023)   Overall Financial Resource Strain (CARDIA)    Difficulty of Paying Living Expenses: Not hard at all  Food Insecurity: Low Risk  (03/09/2023)   [facility]   Food Insecurity    Within the past 12 months, did the food you bought just not last and you didn't have  money to get more?: No    Within the past 12 months, did you worry that your food would run out before you got money to buy more?: No  Transportation Needs: Low Risk  (03/09/2023)   Received from [facility]   Transportation Needs    Within the past 12 months, has a lack of transportation kept you from medical appointments or from doing things needed for daily living?: No  Physical Activity: Sufficiently Active (03/04/2023)   Exercise Vital Sign    Days of Exercise per Week: 6 days    Minutes of Exercise per Session: 30  min  Stress: No Stress Concern Present (03/04/2023)   [employer] of Occupational Health - Occupational Stress Questionnaire    Feeling of Stress : Only a little  Social Connections: Moderately Integrated (03/04/2023)   Social Connection and Isolation Panel [NHANES]    Frequency of Communication with Friends and Family: More than three times a week    Frequency of Social Gatherings with Friends and Family: Twice a week    Attends Religious Services: More than 4 times per year    Active Member of [employer] or Organizations: No    Attends Banker Meetings: Never    Marital Status: Married     Family History: [name] family history includes Alzheimer's disease in his father; Aneurysm in his mother; Cancer in his sister; Hypertension in his brother; Stroke in his brother. There is no history of Colon cancer, Esophageal cancer, Stomach cancer, Rectal cancer, or Colon polyps.  ROS:   Please see the history of present illness.     All other systems reviewed and are negative.  EKGs/Labs/Other Studies Reviewed:    The following studies were reviewed today:   EKG:   03/20/2023: V-paced, frequent PVCs  Recent Labs: 12/05/2022: ALT 16; TSH 2.06 02/05/2023: BUN 14; Creatinine, Ser 0.94; Hemoglobin 16.2; Platelets 137; Potassium 4.5; Sodium 137  Recent Lipid Panel    Component Value Date/Time   CHOL 129 12/05/2022 1008   TRIG 96 12/05/2022  1008   HDL 53 12/05/2022 1008   CHOLHDL 2.4 12/05/2022 1008   VLDL 22 11/06/2016 0910   LDLCALC 58 12/05/2022 1008    Physical Exam:    VS:  BP (!) 120/54 (BP Location: Left Arm, Patient Position: Sitting, Cuff Size: Normal)   Pulse (!) 40   Ht 6\' 4"  (1.93 m)   Wt 182 lb (82.6 kg)   SpO2 98%   BMI 22.15 kg/m     Wt Readings from Last 3 Encounters:  03/20/23 182 lb (82.6 kg)  03/18/23 176 lb 6.4 oz (80 kg)  03/06/23 182 lb (82.6 kg)     GEN:  Well nourished, well developed in no acute distress HEENT: Normal NECK: No JVD; No carotid bruits LYMPHATICS: No lymphadenopathy CARDIAC: RRR, no murmurs, rubs, gallops RESPIRATORY:  Clear to auscultation without rales, wheezing or rhonchi  ABDOMEN: Soft, non-tender, non-distended MUSCULOSKELETAL:  No edema; No deformity  SKIN: Warm and dry NEUROLOGIC:  Alert and oriented x 3 PSYCHIATRIC:  Normal affect   ASSESSMENT:    1. Heart failure with mildly reduced ejection fraction (HFmrEF) (HCC)   2. ASD (atrial septal defect)   3. Atrial flutter, unspecified type (HCC)   4. Frequent PVCs    PLAN:    Atrial flutter: Diagnosed 10/2022.  Underwent DCCV at that time.  Has followed with EP, sees Dr. [name] -Eliquis currently on hold given recent hematuria due to kidney stones for which had ureteral stent placed.  Was told by his urologist could restart Eliquis tomorrow -Continue Toprol-XL, increase to 50 mg daily  Heart block: Status post PPM.  Follows with EP  Frequent PVCs: 11% on recent device interrogation.  Increase Toprol-XL to 50 mg daily  ASD: Small secundum ASD with left to right shunting noted on echocardiogram 10/2022.  Recommend cardiac MRI for further evaluation  Chronic systolic heart failure: EF 45 to 50% on echo 10/2022.  May have been related to atrial flutter as above. -Continue Toprol-XL, will increase to 50 mg daily as above.  Continue losartan 25 mg daily -Check  echocardiogram to evaluate for improvement in systolic  function.  If EF remains reduced with maintenance of sinus rhythm, need to consider ischemic evaluation  Hyperlipidemia: On atorvastatin 10 mg daily  RTC in 3 months  Medication Adjustments/Labs and Tests Ordered: Current medicines are reviewed at length with the patient today.  Concerns regarding medicines are outlined above.  Orders Placed This Encounter  Procedures   MR CARDIAC MORPHOLOGY W WO CONTRAST   EKG 12-Lead   ECHOCARDIOGRAM COMPLETE   Meds ordered this encounter  Medications   metoprolol succinate (TOPROL-XL) 50 MG 24 hr tablet    Sig: Take 1 tablet (50 mg total) by mouth daily. Take with or immediately following a meal.    Dispense:  30 tablet    Refill:  6    Discontinue 25 mg dose    Patient Instructions  Medication Instructions:    Stop 25 mg dose of Toprol Xl   Start  Toprol Xl ( metoprolol succinate ) 50 mg dose once a day   *If you need a refill on your cardiac medications before your next appointment, please call your pharmacy*   Lab Work:  Not needed     Testing/Procedures   Will be schedule at [address] Your physician has requested that you have an echocardiogram. Echocardiography is a painless test that uses sound waves to create images of your heart. It provides your doctor with information about the size and shape of your heart and how well your heart's chambers and valves are working. This procedure takes approximately one hour. There are no restrictions for this procedure. Please do NOT wear cologne, perfume, aftershave, or lotions (deodorant is allowed). Please arrive 15 minutes prior to your appointment time.  Will be schedule at [facility] - radiology  dept  see instruction below  Magnetic resonance imaging (MRI) is a painless test that produces images of the inside of the body without using Xrays.  During an MRI, strong magnets and radio waves work together in a Data processing manager to form detailed images.   MRI  images may provide more details about a medical condition than X-rays, CT scans, and ultrasounds can provide.  Follow-Up: At [facility], you and your health needs are our priority.  As part of our continuing mission to provide you with exceptional heart care, we have created designated Provider Care Teams.  These Care Teams include your primary Cardiologist (physician) and Advanced Practice Providers (APPs -  Physician Assistants and Nurse Practitioners) who all work together to provide you with the care you need, when you need it.  We recommend signing up for the patient portal called "MyChart".  Sign up information is provided on this After Visit Summary.  MyChart is used to connect with patients for Virtual Visits (Telemedicine).  Patients are able to view lab/test results, encounter notes, upcoming appointments, etc.  Non-urgent messages can be sent to your provider as well.   To learn more about what you can do with MyChart, go to [email or web address].    Your next appointment:   3 month(s)  The format for your next appointment:   In Person  Provider:   Little Ishikawa, MD    Other Instructions    You are scheduled for Cardiac MRI  Please arrive for your appointment arrive at 30-45 minutes prior to test start time). ?  [facility] [address] [place], [place] [address] [phone] Please take advantage of the free valet parking available  at the MAIN entrance (A entrance).  Proceed to the [facility] Radiology Department (First Floor) for check-in.    Magnetic resonance imaging (MRI) is a painless test that produces images of the inside of the body without using Xrays.  During an MRI, strong magnets and radio waves work together in a Data processing manager to form detailed images.   MRI images may provide more details about a medical condition than X-rays, CT scans, and ultrasounds can provide.  You may be given earphones to listen for  instructions.  You may eat a light breakfast and take medications as ordered with the exception of HCTZ (fluid pill, other). Please avoid stimulants for 12 hr prior to test. (Ie. Caffeine, nicotine, chocolate, or antihistamine medications)  If a contrast material will be used, an IV will be inserted into one of your veins. Contrast material will be injected into your IV. It will leave your body through your urine within a day. You may be told to drink plenty of fluids to help flush the contrast material out of your system.  You will be asked to remove all metal, including: Watch, jewelry, and other metal objects including hearing aids, hair pieces and dentures. Also wearable glucose monitoring systems (ie. Freestyle Libre and Omnipods) (Braces and fillings normally are not a problem.)   TEST WILL TAKE APPROXIMATELY 1 HOUR  PLEASE NOTIFY SCHEDULING AT LEAST 24 HOURS IN ADVANCE IF YOU ARE UNABLE TO KEEP YOUR APPOINTMENT. [phone]  Please call Rockwell Alexandria, cardiac imaging nurse navigator with any questions/concerns. Rockwell Alexandria RN Navigator Cardiac Imaging Larey Brick RN Navigator Cardiac Imaging [facility] [phone] Office     Signed, Little Ishikawa, MD  03/20/2023 11:38 PM    [facility]

## 2023-03-20 ENCOUNTER — Ambulatory Visit: Payer: Medicare Other | Attending: Cardiology | Admitting: Cardiology

## 2023-03-20 ENCOUNTER — Encounter: Payer: Self-pay | Admitting: Cardiology

## 2023-03-20 VITALS — BP 120/54 | HR 40 | Ht 76.0 in | Wt 182.0 lb

## 2023-03-20 DIAGNOSIS — I493 Ventricular premature depolarization: Secondary | ICD-10-CM | POA: Diagnosis not present

## 2023-03-20 DIAGNOSIS — I5022 Chronic systolic (congestive) heart failure: Secondary | ICD-10-CM

## 2023-03-20 DIAGNOSIS — I4892 Unspecified atrial flutter: Secondary | ICD-10-CM

## 2023-03-20 DIAGNOSIS — Q211 Atrial septal defect, unspecified: Secondary | ICD-10-CM

## 2023-03-20 MED ORDER — METOPROLOL SUCCINATE ER 50 MG PO TB24: 50.0000 mg | ORAL_TABLET | Freq: Every day | ORAL | 6 refills | Status: DC

## 2023-03-20 NOTE — Patient Instructions (Addendum)
Medication Instructions:    Stop 25 mg dose of Toprol Xl   Start  Toprol Xl ( metoprolol succinate ) 50 mg dose once a day   *If you need a refill on your cardiac medications before your next appointment, please call your pharmacy*   Lab Work:  Not needed     Testing/Procedures   Will be schedule at [address] Your physician has requested that you have an echocardiogram. Echocardiography is a painless test that uses sound waves to create images of your heart. It provides your doctor with information about the size and shape of your heart and how well your heart's chambers and valves are working. This procedure takes approximately one hour. There are no restrictions for this procedure. Please do NOT wear cologne, perfume, aftershave, or lotions (deodorant is allowed). Please arrive 15 minutes prior to your appointment time.  Will be schedule at [facility] - radiology  dept  see instruction below  Magnetic resonance imaging (MRI) is a painless test that produces images of the inside of the body without using Xrays.  During an MRI, strong magnets and radio waves work together in a Data processing manager to form detailed images.   MRI images may provide more details about a medical condition than X-rays, CT scans, and ultrasounds can provide.  Follow-Up: At [facility], you and your health needs are our priority.  As part of our continuing mission to provide you with exceptional heart care, we have created designated Provider Care Teams.  These Care Teams include your primary Cardiologist (physician) and Advanced Practice Providers (APPs -  Physician Assistants and Nurse Practitioners) who all work together to provide you with the care you need, when you need it.  We recommend signing up for the patient portal called "MyChart".  Sign up information is provided on this After Visit Summary.  MyChart is used to connect with patients for Virtual Visits (Telemedicine).   Patients are able to view lab/test results, encounter notes, upcoming appointments, etc.  Non-urgent messages can be sent to your provider as well.   To learn more about what you can do with MyChart, go to [email or web address].    Your next appointment:   3 month(s)  The format for your next appointment:   In Person  Provider:   Little Ishikawa, MD    Other Instructions    You are scheduled for Cardiac MRI  Please arrive for your appointment arrive at 30-45 minutes prior to test start time). ?  [facility] [address] [place], [place] [address] [phone] Please take advantage of the free valet parking available at the MAIN entrance (A entrance).  Proceed to the [facility] Radiology Department (First Floor) for check-in.    Magnetic resonance imaging (MRI) is a painless test that produces images of the inside of the body without using Xrays.  During an MRI, strong magnets and radio waves work together in a Data processing manager to form detailed images.   MRI images may provide more details about a medical condition than X-rays, CT scans, and ultrasounds can provide.  You may be given earphones to listen for instructions.  You may eat a light breakfast and take medications as ordered with the exception of HCTZ (fluid pill, other). Please avoid stimulants for 12 hr prior to test. (Ie. Caffeine, nicotine, chocolate, or antihistamine medications)  If a contrast material will be used, an IV will be inserted into one of your veins. Contrast material  will be injected into your IV. It will leave your body through your urine within a day. You may be told to drink plenty of fluids to help flush the contrast material out of your system.  You will be asked to remove all metal, including: Watch, jewelry, and other metal objects including hearing aids, hair pieces and dentures. Also wearable glucose monitoring systems (ie. Freestyle Libre and Omnipods) (Braces and  fillings normally are not a problem.)   TEST WILL TAKE APPROXIMATELY 1 HOUR  PLEASE NOTIFY SCHEDULING AT LEAST 24 HOURS IN ADVANCE IF YOU ARE UNABLE TO KEEP YOUR APPOINTMENT. [phone]  Please call Rockwell Alexandria, cardiac imaging nurse navigator with any questions/concerns. Rockwell Alexandria RN Navigator Cardiac Imaging Larey Brick RN Navigator Cardiac Imaging [facility] [phone] Office

## 2023-03-24 ENCOUNTER — Encounter: Payer: Self-pay | Admitting: Family Medicine

## 2023-03-26 ENCOUNTER — Inpatient Hospital Stay
Admission: RE | Admit: 2023-03-26 | Discharge: 2023-03-26 | Discharge status: Home / Self Care | Payer: No Typology Code available for payment source | Source: Ambulatory Visit | Attending: Family Medicine | Admitting: Family Medicine

## 2023-03-26 DIAGNOSIS — H93A9 Pulsatile tinnitus, unspecified ear: Secondary | ICD-10-CM

## 2023-03-26 DIAGNOSIS — I6523 Occlusion and stenosis of bilateral carotid arteries: Secondary | ICD-10-CM | POA: Diagnosis not present

## 2023-03-28 ENCOUNTER — Encounter: Payer: Medicare Other | Admitting: Cardiovascular Disease

## 2023-03-28 ENCOUNTER — Ambulatory Visit (INDEPENDENT_AMBULATORY_CARE_PROVIDER_SITE_OTHER): Payer: Medicare Other

## 2023-03-28 DIAGNOSIS — I459 Conduction disorder, unspecified: Secondary | ICD-10-CM

## 2023-03-28 DIAGNOSIS — J320 Chronic maxillary sinusitis: Secondary | ICD-10-CM | POA: Diagnosis not present

## 2023-04-01 DIAGNOSIS — N202 Calculus of kidney with calculus of ureter: Secondary | ICD-10-CM | POA: Diagnosis not present

## 2023-04-01 LAB — CUP PACEART REMOTE DEVICE CHECK
Battery Remaining Longevity: 56 mo
Battery Remaining Percentage: 95.5 %
Battery Voltage: 2.98 V
Brady Statistic AP VP Percent: 53 %
Brady Statistic AP VS Percent: 1 %
Brady Statistic AS VP Percent: 46 %
Brady Statistic AS VS Percent: 1 %
Brady Statistic RA Percent Paced: 49 %
Brady Statistic RV Percent Paced: 99 %
Date Time Interrogation Session: 20241011040018
Implantable Lead Connection Status: 753985
Implantable Lead Connection Status: 753985
Implantable Lead Implant Date: 20240711
Implantable Lead Implant Date: 20240711
Implantable Lead Location: 753859
Implantable Lead Location: 753860
Implantable Pulse Generator Implant Date: 20240711
Lead Channel Impedance Value: 440 Ohm
Lead Channel Impedance Value: 510 Ohm
Lead Channel Pacing Threshold Amplitude: 0.75 V
Lead Channel Pacing Threshold Amplitude: 0.75 V
Lead Channel Pacing Threshold Pulse Width: 0.5 ms
Lead Channel Pacing Threshold Pulse Width: 0.5 ms
Lead Channel Sensing Intrinsic Amplitude: 12 mV
Lead Channel Sensing Intrinsic Amplitude: 5 mV
Lead Channel Setting Pacing Amplitude: 3.5 V
Lead Channel Setting Pacing Amplitude: 3.5 V
Lead Channel Setting Pacing Pulse Width: 0.5 ms
Lead Channel Setting Sensing Sensitivity: 0.5 mV
Pulse Gen Model: 2272
Pulse Gen Serial Number: 5829995

## 2023-04-04 ENCOUNTER — Encounter: Payer: Self-pay | Admitting: Cardiovascular Disease

## 2023-04-04 ENCOUNTER — Ambulatory Visit (HOSPITAL_COMMUNITY): Payer: Medicare Other | Attending: Internal Medicine

## 2023-04-04 ENCOUNTER — Ambulatory Visit: Payer: Medicare Other | Admitting: Cardiovascular Disease

## 2023-04-04 VITALS — BP 106/62 | HR 67 | Ht 76.0 in | Wt 182.0 lb

## 2023-04-04 DIAGNOSIS — I4892 Unspecified atrial flutter: Secondary | ICD-10-CM | POA: Insufficient documentation

## 2023-04-04 DIAGNOSIS — I5022 Chronic systolic (congestive) heart failure: Secondary | ICD-10-CM | POA: Insufficient documentation

## 2023-04-04 LAB — ECHOCARDIOGRAM COMPLETE
Area-P 1/2: 1.93 cm2
Height: 76 in
S' Lateral: 4.9 cm
Weight: 2912 [oz_av]

## 2023-04-04 NOTE — Patient Instructions (Signed)
Medication Instructions:  Your physician recommends that you continue on your current medications as directed. Please refer to the Current Medication list given to you today. *If you need a refill on your cardiac medications before your next appointment, please call your pharmacy*   Follow-Up: At [facility], you and your health needs are our priority.  As part of our continuing mission to provide you with exceptional heart care, we have created designated Provider Care Teams.  These Care Teams include your primary Cardiologist (physician) and Advanced Practice Providers (APPs -  Physician Assistants and Nurse Practitioners) who all work together to provide you with the care you need, when you need it.  We recommend signing up for the patient portal called "MyChart".  Sign up information is provided on this After Visit Summary.  MyChart is used to connect with patients for Virtual Visits (Telemedicine).  Patients are able to view lab/test results, encounter notes, upcoming appointments, etc.  Non-urgent messages can be sent to your provider as well.   To learn more about what you can do with MyChart, go to [email or web address].    Your next appointment:   1 year(s)  Provider:   You will see one of the following Advanced Practice Providers on your designated Care Team:   Francis Dowse, Charlott Holler [address] [place], [place] Sherie Don, NP Canary Brim, NP

## 2023-04-04 NOTE — Progress Notes (Signed)
Electrophysiology Office Note:    Date:  04/04/2023   ID:  Carlos Choi, DOB 04-20-1946, MRN [id number]  PCP:  Carlos Brooks, MD   [facility] Providers Cardiologist:  Carlos Ishikawa, MD     Referring MD: Carlos Brooks, MD   History of Present Illness:    Carlos Choi is a 77 y.o. male with a medical history significant for smoking, bronchiectasis, hypothyroidism, and atrial flutter referred for bradycardia.     He was diagnosed with atrial flutter incidentally at the time of maxillary antrostomy on 10/16/2022.  He had slow ventricular rates, 35 to 70 bpm.  He was unaware of his arrhythmia.   Eliquis started and he underwent DC cardioversion.   ECG from May 30 shows Mobitz 1 AV block. He has been having significant fatigue. He is able to walk a mile and a half at a slow pace with his wife every morning. He has noticed intermittent ankle swelling over the past 1 months.  He underwent pacemaker placement on December 26, 2022. This was a complicated procedure. His left axillary vein was occluded, so we had to perform a right-sided implant     Today, he reports that he is doing reasonly well.  He does not have any discomfort at the device site.  He is planning to undergo sinus surgery later this week.    EKGs/Labs/Other Studies Reviewed Today:    Echocardiogram:  TTE 11/15/2022 EF 45-50%. Moderately dilated LA, severely dilated RA   Monitors:   Stress testing:   Advanced imaging:   Cardiac catherization   EKG:   EKG Interpretation Date/Time:  Friday April 04 2023 14:12:01 EDT Ventricular Rate:  67 PR Interval:  186 QRS Duration:  166 QT Interval:  450 QTC Calculation: 475 R Axis:   -32  Text Interpretation: AV dual-paced rhythm with occasional Premature ventricular complexes When compared with ECG of 20-Mar-2023 14:48, Premature ventricular complexes are now Present Vent. rate has decreased BY  11 BPM Confirmed by York Pellant  [phone]) on 04/04/2023 2:14:03 PM     Physical Exam:    VS:  BP 106/62   Pulse 67   Ht 6\' 4"  (1.93 m)   Wt 182 lb (82.6 kg)   SpO2 97%   BMI 22.15 kg/m     Wt Readings from Last 3 Encounters:  04/04/23 182 lb (82.6 kg)  03/20/23 182 lb (82.6 kg)  03/18/23 176 lb 6.4 oz (80 kg)     GEN:  Well nourished, well developed in no acute distress CARDIAC: irregular rhythm, no murmurs, rubs, gallops The device site is normal -- no tenderness, edema, drainage, redness, threatened erosion.  RESPIRATORY:  Normal work of breathing MUSCULOSKELETAL: no edema    ASSESSMENT & PLAN:    Second degree AV block, complete heart block S/p pacemaker placement July, 2024 His device was interrogated today.  I reviewed the interrogation in detail.  See Paceart for report He is not device dependent today   Atrial flutter Typical atrial flutter on ECG 11/07/2022 Will monitor for reoccurrence after device placement RA is severely dilated Continue apixaban 5 mg Will likely need ablation in the future  Frequent PVCs May be contributing to fatigue and cardiomyopathy Will begin medical therapy after pacemaker is in place  Secondary hypercoagulable state Continue apixaban 5 mg  Cardiomyopathy EF 45% PET is scheduled Continue metoprolol XL 25, losartan 25    Signed, Carlos Small, MD  04/04/2023 2:17 PM    [facility]

## 2023-04-05 DIAGNOSIS — Z23 Encounter for immunization: Secondary | ICD-10-CM | POA: Diagnosis not present

## 2023-04-07 ENCOUNTER — Telehealth: Payer: Self-pay

## 2023-04-07 ENCOUNTER — Telehealth: Payer: Self-pay | Admitting: Cardiology

## 2023-04-07 NOTE — Progress Notes (Signed)
Remote pacemaker transmission.   

## 2023-04-07 NOTE — Telephone Encounter (Signed)
Pt states he just spoke Juluis Pitch and needs to speak with her again//ah

## 2023-04-07 NOTE — Telephone Encounter (Signed)
Spoke to patient advised Dr.Schumann wants to see you in office to discuss echo results appointment scheduled 10/28 at 2:20 pm.Patient stated he has a 10 day trip planned this Thurs 10/24.He wanted to schedule appointment this week.Advised Dr.Schumann not in office this week.He wanted to ask Dr.Schumann if ok to go [place] trip [place] on [place] and schedule appointment when he returns.Message sent to Dr.Schumann for advice.

## 2023-04-07 NOTE — Telephone Encounter (Signed)
Could we add him on at 10 AM on 11/8?

## 2023-04-08 NOTE — Telephone Encounter (Signed)
Spoke to patient stated he cannot come 11/8,that is the day he comes back from his trip.Appointment scheduled with Dr.Schumann 11/11 at 9:20 am.I will make Dr.Schumann aware.

## 2023-04-08 NOTE — Telephone Encounter (Signed)
Already spoke to patient see 10/22 telephone note.

## 2023-04-13 LAB — CUP PACEART INCLINIC DEVICE CHECK
Battery Remaining Longevity: 93 mo
Battery Voltage: 2.98 V
Brady Statistic RA Percent Paced: 51 %
Brady Statistic RV Percent Paced: 99 %
Date Time Interrogation Session: 20241018152624
Implantable Lead Connection Status: 753985
Implantable Lead Connection Status: 753985
Implantable Lead Implant Date: 20240711
Implantable Lead Implant Date: 20240711
Implantable Lead Location: 753859
Implantable Lead Location: 753860
Implantable Pulse Generator Implant Date: 20240711
Lead Channel Impedance Value: 450 Ohm
Lead Channel Impedance Value: 487.5 Ohm
Lead Channel Pacing Threshold Amplitude: 0.75 V
Lead Channel Pacing Threshold Amplitude: 0.75 V
Lead Channel Pacing Threshold Amplitude: 0.75 V
Lead Channel Pacing Threshold Amplitude: 0.75 V
Lead Channel Pacing Threshold Pulse Width: 0.5 ms
Lead Channel Pacing Threshold Pulse Width: 0.5 ms
Lead Channel Pacing Threshold Pulse Width: 0.5 ms
Lead Channel Pacing Threshold Pulse Width: 0.5 ms
Lead Channel Sensing Intrinsic Amplitude: 5 mV
Lead Channel Setting Pacing Amplitude: 2 V
Lead Channel Setting Pacing Amplitude: 2.5 V
Lead Channel Setting Pacing Pulse Width: 0.5 ms
Lead Channel Setting Sensing Sensitivity: 0.5 mV
Pulse Gen Model: 2272
Pulse Gen Serial Number: 5829995

## 2023-04-14 ENCOUNTER — Ambulatory Visit: Payer: Medicare Other | Admitting: Cardiology

## 2023-04-25 ENCOUNTER — Ambulatory Visit: Payer: Medicare Other | Admitting: Cardiology

## 2023-04-27 NOTE — H&P (View-Only) (Signed)
 Cardiology Office Note:    Date:  04/28/2023   ID:  Carlos Choi, DOB 1945/10/16, MRN [id number]  PCP:  Carlos Brooks, MD  Cardiologist:  Carlos Ishikawa, MD  Electrophysiologist:  None   Referring MD: Carlos Brooks, MD   Chief Complaint  Patient presents with   Congestive Heart Failure    History of Present Illness:    Carlos Choi is a 77 y.o. male with a hx of atrial flutter, ASD, heart block status post PPM, bronchiectasis, hypertension, tobacco use, hypothyroidism who is referred by Dr. Nelly Choi for evaluation of heart failure.  He was initially diagnosed with atrial flutter 10/2022.  At that time he was started on Eliquis and underwent DCCV.  Presented with complete heart block and underwent PPM 12/2022.  Echocardiogram 11/15/2022 showed EF 45 to 50%, normal RV function, moderate RV enlargement, moderate left atrial enlargement, severe right atrial enlargement, moderate MR, small secundum ASD with left-to-right shunting.  Echocardiogram 04/04/2023 showed EF 25 to 30%, mild RV dysfunction, mild mitral regurgitation, small secundum ASD.  Since last clinic visit, he reports is doing OK.  Just got back from 9 day vacation.  Denies any chest pain, dyspnea, lightheadedness, syncope, lower extremity edema, or palpitations.  On eliquis, denies any bleeding.    Past Medical History:  Diagnosis Date   Allergy    Anxiety    Arthritis    mild ankles    ASD (atrial septal defect)    ASD (small secundum ASD with predominantly left-to-right shunting) 11/15/22 TTE   Atrial flutter (HCC)    s/p DCCV 11/04/22   Bronchiectasis (HCC)    Cataract    removed left eye, forming right eye    Diverticulosis    Dysrhythmia    2nd degree AV block, bradycardia s/p PPM 12/26/22; PVCs   GERD (gastroesophageal reflux disease)    Hemorrhoids    History of kidney stones    Hypertension    Hypothyroid    Presence of permanent cardiac pacemaker 12/26/2022   [employer]/A[employer]ott   Rectal  bleeding    due to hemorrhoid    Tubular adenoma of colon 2015    Past Surgical History:  Procedure Laterality Date   CARDIOVERSION N/A 11/14/2022   Procedure: CARDIOVERSION;  Surgeon: Jake Bathe, MD;  Location: [facility];  Service: Cardiovascular;  Laterality: N/A;   CATARACT EXTRACTION Left    COLONOSCOPY     CYSTOSCOPY/URETEROSCOPY/HOLMIUM LASER/STENT PLACEMENT Left 03/18/2023   Procedure: CYSTOSCOPY LEFT URETEROSCOPY/STENT PLACEMENT;  Surgeon: Bjorn Pippin, MD;  Location: [facility];  Service: Urology;  Laterality: Left;  1 HR FOR CASE   HERNIA REPAIR Bilateral    Over ten years ago per patient. He was not sure of the date.   NASAL SINUS SURGERY Left 02/05/2023   Procedure: ENDOSCOPIC MAXILLARY ANTROSTOMY WITH TISSUE REMOVAL;  Surgeon: Serena Colonel, MD;  Location: [facility] OR;  Service: ENT;  Laterality: Left;   PACEMAKER IMPLANT N/A 12/26/2022   Procedure: PACEMAKER IMPLANT;  Surgeon: Maurice Small, MD;  Location: [facility];  Service: Cardiovascular;  Laterality: N/A;   POLYPECTOMY     ROTATOR CUFF REPAIR     both shoulders - patient does not remember the exact date    Current Medications: Current Meds  Medication Sig   acetaminophen (TYLENOL) 650 MG CR tablet Take 1,300 mg by mouth every 8 (eight) hours as needed for pain.   ALPRAZolam (XANAX) 0.25 MG tablet Take 1 tablet (0.25 mg  total) by mouth daily as needed for anxiety.   apixaban (ELIQUIS) 5 MG TABS tablet Take 1 tablet (5 mg total) by mouth 2 (two) times daily.   atorvastatin (LIPITOR) 10 MG tablet Take 1 tablet (10 mg total) by mouth daily.   Guaifenesin (MUCINEX MAXIMUM STRENGTH) 1200 MG TB12 Take 1,200 mg by mouth daily as needed (Congestion).   hyoscyamine (LEVSIN) 0.125 MG tablet Take 1 tablet (0.125 mg total) by mouth every 6 (six) hours as needed for cramping (diarrhea).   ipratropium (ATROVENT) 0.03 % nasal spray Place 2 sprays into both nostrils daily as needed for rhinitis.   Lactobacillus-Inulin  (CULTURELLE DIGESTIVE HEALTH PO) Take 1 capsule by mouth daily.   levothyroxine (SYNTHROID) 100 MCG tablet Take 1 tablet (100 mcg total) by mouth daily.   loratadine (CLARITIN) 10 MG tablet Take 10 mg by mouth daily.   metoprolol succinate (TOPROL-XL) 50 MG 24 hr tablet Take 1 tablet (50 mg total) by mouth daily. Take with or immediately following a meal.   MOMETASONE FURO & DIMETHICONE EX Place 2 drops into both ears daily as needed (Itching /fluid).   Multiple Vitamins-Minerals (CENTRUM SILVER PO) Take 1 tablet by mouth daily.   Omega-3 Fatty Acids (FISH OIL) 1000 MG CAPS Take 2,000 mg by mouth daily.   PSYLLIUM PO Take 1 packet by mouth daily as needed (Regulate bowel movement).   silodosin (RAPAFLO) 8 MG CAPS capsule Take 8 mg by mouth daily.   Sod Bicarb-K Bicarb-Citric Acd (ALKA-SELTZER GOLD) [phone] MG TBEF Take 2 tablets by mouth daily as needed (Upset stomach).   [DISCONTINUED] losartan (COZAAR) 25 MG tablet Take 1 tablet (25 mg total) by mouth daily.     Allergies:   Patient has no known allergies.   Social History   Socioeconomic History   Marital status: Married    Spouse name: Carlos Choi.   Number of children: 2   Years of education: Not on file   Highest education level: Not on file  Occupational History   Not on file  Tobacco Use   Smoking status: Former    Current packs/day: 0.00    Average packs/day: 0.5 packs/day for 6.0 years (3.0 ttl pk-yrs)    Types: Cigarettes    Start date: 06/17/1973    Quit date: 06/18/1979    Years since quitting: 43.8   Smokeless tobacco: Never  Vaping Use   Vaping status: Never Used  Substance and Sexual Activity   Alcohol use: Yes    Comment: 1-2 beers Monthly.   Drug use: No   Sexual activity: Not on file  Other Topics Concern   Not on file  Social History Narrative   Not on file   Social Determinants of Health   Financial Resource Strain: Low Risk  (03/04/2023)   Overall Financial Resource Strain (CARDIA)    Difficulty of  Paying Living Expenses: Not hard at all  Food Insecurity: Low Risk  (03/09/2023)   Received from [facility]   Food Insecurity    Within the past 12 months, did the food you bought just not last and you didn't have money to get more?: No    Within the past 12 months, did you worry that your food would run out before you got money to buy more?: No  Transportation Needs: Low Risk  (03/09/2023)   Received from [facility]   Transportation Needs    Within the past 12 months, has a lack of transportation kept you  from medical appointments or from doing things needed for daily living?: No  Physical Activity: Sufficiently Active (03/04/2023)   Exercise Vital Sign    Days of Exercise per Week: 6 days    Minutes of Exercise per Session: 30 min  Stress: No Stress Concern Present (03/04/2023)   [employer] of Occupational Health - Occupational Stress Questionnaire    Feeling of Stress : Only a Carlos  Social Connections: Moderately Integrated (03/04/2023)   Social Connection and Isolation Panel [NHANES]    Frequency of Communication with Friends and Family: More than three times a week    Frequency of Social Gatherings with Friends and Family: Twice a week    Attends Religious Services: More than 4 times per year    Active Member of [employer] or Organizations: No    Attends Banker Meetings: Never    Marital Status: Married     Family History: [name] family history includes Alzheimer's disease in his father; Aneurysm in his mother; Cancer in his sister; Hypertension in his brother; Stroke in his brother. There is no history of Colon cancer, Esophageal cancer, Stomach cancer, Rectal cancer, or Colon polyps.  ROS:   Please see the history of present illness.     All other systems reviewed and are negative.  EKGs/Labs/Other Studies Reviewed:    The following studies were reviewed today:   EKG:   03/20/2023: V-paced, frequent PVCs  Recent  Labs: 12/05/2022: ALT 16; TSH 2.06 02/05/2023: BUN 14; Creatinine, Ser 0.94; Hemoglobin 16.2; Platelets 137; Potassium 4.5; Sodium 137  Recent Lipid Panel    Component Value Date/Time   CHOL 129 12/05/2022 1008   TRIG 96 12/05/2022 1008   HDL 53 12/05/2022 1008   CHOLHDL 2.4 12/05/2022 1008   VLDL 22 11/06/2016 0910   LDLCALC 58 12/05/2022 1008    Physical Exam:    VS:  BP 126/80 (BP Location: Right Arm, Patient Position: Sitting, Cuff Size: Normal)   Pulse 76   Ht 6\' 4"  (1.93 m)   Wt 187 lb 6.4 oz (85 kg)   SpO2 98%   BMI 22.81 kg/m     Wt Readings from Last 3 Encounters:  04/28/23 187 lb 6.4 oz (85 kg)  04/04/23 182 lb (82.6 kg)  03/20/23 182 lb (82.6 kg)     GEN:  Well nourished, well developed in no acute distress HEENT: Normal NECK: No JVD; No carotid bruits LYMPHATICS: No lymphadenopathy CARDIAC: RRR, no murmurs, rubs, gallops RESPIRATORY:  Clear to auscultation without rales, wheezing or rhonchi  ABDOMEN: Soft, non-tender, non-distended MUSCULOSKELETAL:  No edema; No deformity  SKIN: Warm and dry NEUROLOGIC:  Alert and oriented x 3 PSYCHIATRIC:  Normal affect   ASSESSMENT:    1. Chronic combined systolic and diastolic heart failure (HCC)   2. Atrial flutter, unspecified type (HCC)   3. Frequent PVCs   4. ASD (atrial septal defect)   5. Hyperlipidemia, unspecified hyperlipidemia type     PLAN:    Chronic combined systolic and diastolic heart failure: EF 45 to 50% on echo 10/2022.  Echocardiogram 04/04/2023 showed EF 25 to 30%, mild RV dysfunction, my left atrial lodgment, mild mitral regurgitation, small secundum ASD. -Continue Toprol-XL 50 mg daily -Recommend stopping losartan and starting Entresto 24-26 mg twice daily -Recommend ischemic evaluation with LHC/RHC.  Would also recommend shunt run to evaluate ASD.  Risks and benefits of cardiac catheterization have been discussed with the patient.  These include bleeding, infection, kidney damage, stroke,  heart attack,  death.  The patient understands these risks and is willing to proceed.  Atrial flutter: Diagnosed 10/2022.  Underwent DCCV at that time.  Has followed with EP, sees Dr. Nelly Choi -Continue Eliquis -Continue Toprol-XL, increase to 50 mg daily  Heart block: Status post PPM.  Follows with EP  Frequent PVCs: 11% on recent device interrogation.  Increased Toprol-XL to 50 mg daily  ASD: Small secundum ASD with left to right shunting noted on echocardiogram 10/2022.  Recommend cardiac MRI for further evaluation  Hyperlipidemia: On atorvastatin 10 mg daily.  LDL 58 on 12/05/2022  RTC in 1 week  Medication Adjustments/Labs and Tests Ordered: Current medicines are reviewed at length with the patient today.  Concerns regarding medicines are outlined above.  Orders Placed This Encounter  Procedures   CBC   Basic Metabolic Panel (BMET)   LEFT AND RIGHT HEART CATHETERIZATION WITH CORONARY ANGIOGRAM   Meds ordered this encounter  Medications   DISCONTD: sacubitril-valsartan (ENTRESTO) 24-26 mg per tablet    Order Specific Question:   ACE-inhibitors have NOT been administered in the past 36-hours.    Answer:   YES (confirmed by ordering provider)    Patient Instructions  Medication Instructions:  Stop losartan  Start Entresto 24/26 twice A DAY *If you need a refill on your cardiac medications before your next appointment, please call your pharmacy*   Lab Work: Cbc, bmet today If you have labs (blood work) drawn today and your tests are completely normal, you will receive your results only by: MyChart Message (if you have MyChart) OR A paper copy in the mail If you have any lab test that is abnormal or we need to change your treatment, we will call you to review the results.   Testing/Procedures: Left and right cath. Hs been schedule for Friday Nov 15 please be there at 10:00 for procedure at 12:00   Follow-Up: At [facility], you and your health needs are our  priority.  As part of our continuing mission to provide you with exceptional heart care, we have created designated Provider Care Teams.  These Care Teams include your primary Cardiologist (physician) and Advanced Practice Providers (APPs -  Physician Assistants and Nurse Practitioners) who all work together to provide you with the care you need, when you need it.  We recommend signing up for the patient portal called "MyChart".  Sign up information is provided on this After Visit Summary.  MyChart is used to connect with patients for Virtual Visits (Telemedicine).  Patients are able to view lab/test results, encounter notes, upcoming appointments, etc.  Non-urgent messages can be sent to your provider as well.   To learn more about what you can do with MyChart, go to [email or web address].    Your next appointment:   Nov 19@ 4:30 pm  Provider:   Little Ishikawa, MD     Other Instructions  [facility] A DEPT OF [place]. [facility] AT [facility] [address] [place] [address] Dept: [phone] Loc: [phone]  Carlos Choi  04/28/2023  You are scheduled for a Cardiac Catheterization on Friday, November 15 with Dr. Peter [place].  1. Please arrive at the [facility] (Main Entrance A) at [facility]: [address] [place], [place] [address] at 10:00 AM (This time is 2 hour(s) before your procedure to ensure your preparation). Free valet parking service is available. You will check in at ADMITTING. The support person will be asked  to wait in the waiting room.  It is OK to have someone drop you off and come back when you are ready to be discharged.    Special note: Every effort is made to have your procedure done on time. Please understand that emergencies sometimes delay scheduled procedures.  2. Diet: Do not eat solid foods after midnight.  The patient may have clear liquids until 5am upon the day of  the procedure.  3. Labs: You will need to have blood drawn on cbc, bmet today  . Medication instructions in preparation for your procedure: hold Eliquis  two day  before your  procedure   Contrast Allergy: No   Stop taking Eliquis (Apixiban) on Friday, November 15.     On the morning of your procedure, take your Aspirin 81 mg and any morning medicines NOT listed above.  You may use sips of water.  5. Plan to go home the same day, you will only stay overnight if medically necessary. 6. Bring a current list of your medications and current insurance cards. 7. You MUST have a responsible person to drive you home. 8. Someone MUST be with you the first 24 hours after you arrive home or your discharge will be delayed. 9. Please wear clothes that are easy to get on and off and wear slip-on shoes.  Thank you for allowing [place] to care for you!   -- [facility] Invasive Cardiovascular services    Signed, Carlos Ishikawa, MD  04/28/2023 11:53 AM    [facility]

## 2023-04-27 NOTE — Progress Notes (Unsigned)
Cardiology Office Note:    Date:  04/28/2023   ID:  Carlos Choi, DOB 1945/10/16, MRN [id number]  PCP:  [name], MD  Cardiologist:  Little Ishikawa, MD  Electrophysiologist:  None   Referring MD: [name], MD   Chief Complaint  Patient presents with   Congestive Heart Failure    History of Present Illness:    Carlos Choi is a 77 y.o. male with a hx of atrial flutter, ASD, heart block status post PPM, bronchiectasis, hypertension, tobacco use, hypothyroidism who is referred by Dr. [name] for evaluation of heart failure.  He was initially diagnosed with atrial flutter 10/2022.  At that time he was started on Eliquis and underwent DCCV.  Presented with complete heart block and underwent PPM 12/2022.  Echocardiogram 11/15/2022 showed EF 45 to 50%, normal RV function, moderate RV enlargement, moderate left atrial enlargement, severe right atrial enlargement, moderate MR, small secundum ASD with left-to-right shunting.  Echocardiogram 04/04/2023 showed EF 25 to 30%, mild RV dysfunction, mild mitral regurgitation, small secundum ASD.  Since last clinic visit, he reports is doing OK.  Just got back from 9 day vacation.  Denies any chest pain, dyspnea, lightheadedness, syncope, lower extremity edema, or palpitations.  On eliquis, denies any bleeding.    Past Medical History:  Diagnosis Date   Allergy    Anxiety    Arthritis    mild ankles    ASD (atrial septal defect)    ASD (small secundum ASD with predominantly left-to-right shunting) 11/15/22 TTE   Atrial flutter (HCC)    s/p DCCV 11/04/22   Bronchiectasis (HCC)    Cataract    removed left eye, forming right eye    Diverticulosis    Dysrhythmia    2nd degree AV block, bradycardia s/p PPM 12/26/22; PVCs   GERD (gastroesophageal reflux disease)    Hemorrhoids    History of kidney stones    Hypertension    Hypothyroid    Presence of permanent cardiac pacemaker 12/26/2022   [employer]   Rectal  bleeding    due to hemorrhoid    Tubular adenoma of colon 2015    Past Surgical History:  Procedure Laterality Date   CARDIOVERSION N/A 11/14/2022   Procedure: CARDIOVERSION;  Surgeon: Jake Bathe, MD;  Location: [facility];  Service: Cardiovascular;  Laterality: N/A;   CATARACT EXTRACTION Left    COLONOSCOPY     CYSTOSCOPY/URETEROSCOPY/HOLMIUM LASER/STENT PLACEMENT Left 03/18/2023   Procedure: CYSTOSCOPY LEFT URETEROSCOPY/STENT PLACEMENT;  Surgeon: Bjorn Pippin, MD;  Location: [facility];  Service: Urology;  Laterality: Left;  1 HR FOR CASE   HERNIA REPAIR Bilateral    Over ten years ago per patient. He was not sure of the date.   NASAL SINUS SURGERY Left 02/05/2023   Procedure: ENDOSCOPIC MAXILLARY ANTROSTOMY WITH TISSUE REMOVAL;  Surgeon: Serena Colonel, MD;  Location: [facility] OR;  Service: ENT;  Laterality: Left;   PACEMAKER IMPLANT N/A 12/26/2022   Procedure: PACEMAKER IMPLANT;  Surgeon: Maurice Small, MD;  Location: [facility];  Service: Cardiovascular;  Laterality: N/A;   POLYPECTOMY     ROTATOR CUFF REPAIR     both shoulders - patient does not remember the exact date    Current Medications: Current Meds  Medication Sig   acetaminophen (TYLENOL) 650 MG CR tablet Take 1,300 mg by mouth every 8 (eight) hours as needed for pain.   ALPRAZolam (XANAX) 0.25 MG tablet Take 1 tablet (0.25 mg  total) by mouth daily as needed for anxiety.   apixaban (ELIQUIS) 5 MG TABS tablet Take 1 tablet (5 mg total) by mouth 2 (two) times daily.   atorvastatin (LIPITOR) 10 MG tablet Take 1 tablet (10 mg total) by mouth daily.   Guaifenesin (MUCINEX MAXIMUM STRENGTH) 1200 MG TB12 Take 1,200 mg by mouth daily as needed (Congestion).   hyoscyamine (LEVSIN) 0.125 MG tablet Take 1 tablet (0.125 mg total) by mouth every 6 (six) hours as needed for cramping (diarrhea).   ipratropium (ATROVENT) 0.03 % nasal spray Place 2 sprays into both nostrils daily as needed for rhinitis.   Lactobacillus-Inulin  (CULTURELLE DIGESTIVE HEALTH PO) Take 1 capsule by mouth daily.   levothyroxine (SYNTHROID) 100 MCG tablet Take 1 tablet (100 mcg total) by mouth daily.   loratadine (CLARITIN) 10 MG tablet Take 10 mg by mouth daily.   metoprolol succinate (TOPROL-XL) 50 MG 24 hr tablet Take 1 tablet (50 mg total) by mouth daily. Take with or immediately following a meal.   MOMETASONE FURO & DIMETHICONE EX Place 2 drops into both ears daily as needed (Itching /fluid).   Multiple Vitamins-Minerals (CENTRUM SILVER PO) Take 1 tablet by mouth daily.   Omega-3 Fatty Acids (FISH OIL) 1000 MG CAPS Take 2,000 mg by mouth daily.   PSYLLIUM PO Take 1 packet by mouth daily as needed (Regulate bowel movement).   silodosin (RAPAFLO) 8 MG CAPS capsule Take 8 mg by mouth daily.   Sod Bicarb-K Bicarb-Citric Acd (ALKA-SELTZER GOLD) [phone] MG TBEF Take 2 tablets by mouth daily as needed (Upset stomach).   [DISCONTINUED] losartan (COZAAR) 25 MG tablet Take 1 tablet (25 mg total) by mouth daily.     Allergies:   Patient has no known allergies.   Social History   Socioeconomic History   Marital status: Married    Spouse name: Carlos Choi.   Number of children: 2   Years of education: Not on file   Highest education level: Not on file  Occupational History   Not on file  Tobacco Use   Smoking status: Former    Current packs/day: 0.00    Average packs/day: 0.5 packs/day for 6.0 years (3.0 ttl pk-yrs)    Types: Cigarettes    Start date: 06/17/1973    Quit date: 06/18/1979    Years since quitting: 43.8   Smokeless tobacco: Never  Vaping Use   Vaping status: Never Used  Substance and Sexual Activity   Alcohol use: Yes    Comment: 1-2 beers Monthly.   Drug use: No   Sexual activity: Not on file  Other Topics Concern   Not on file  Social History Narrative   Not on file   Social Determinants of Health   Financial Resource Strain: Low Risk  (03/04/2023)   Overall Financial Resource Strain (CARDIA)    Difficulty of  Paying Living Expenses: Not hard at all  Food Insecurity: Low Risk  (03/09/2023)   Received from [facility]   Food Insecurity    Within the past 12 months, did the food you bought just not last and you didn't have money to get more?: No    Within the past 12 months, did you worry that your food would run out before you got money to buy more?: No  Transportation Needs: Low Risk  (03/09/2023)   Received from [facility]   Transportation Needs    Within the past 12 months, has a lack of transportation kept you  from medical appointments or from doing things needed for daily living?: No  Physical Activity: Sufficiently Active (03/04/2023)   Exercise Vital Sign    Days of Exercise per Week: 6 days    Minutes of Exercise per Session: 30 min  Stress: No Stress Concern Present (03/04/2023)   [employer] of Occupational Health - Occupational Stress Questionnaire    Feeling of Stress : Only a little  Social Connections: Moderately Integrated (03/04/2023)   Social Connection and Isolation Panel [NHANES]    Frequency of Communication with Friends and Family: More than three times a week    Frequency of Social Gatherings with Friends and Family: Twice a week    Attends Religious Services: More than 4 times per year    Active Member of [employer] or Organizations: No    Attends Banker Meetings: Never    Marital Status: Married     Family History: [name] family history includes Alzheimer's disease in his father; Aneurysm in his mother; Cancer in his sister; Hypertension in his brother; Stroke in his brother. There is no history of Colon cancer, Esophageal cancer, Stomach cancer, Rectal cancer, or Colon polyps.  ROS:   Please see the history of present illness.     All other systems reviewed and are negative.  EKGs/Labs/Other Studies Reviewed:    The following studies were reviewed today:   EKG:   03/20/2023: V-paced, frequent PVCs  Recent  Labs: 12/05/2022: ALT 16; TSH 2.06 02/05/2023: BUN 14; Creatinine, Ser 0.94; Hemoglobin 16.2; Platelets 137; Potassium 4.5; Sodium 137  Recent Lipid Panel    Component Value Date/Time   CHOL 129 12/05/2022 1008   TRIG 96 12/05/2022 1008   HDL 53 12/05/2022 1008   CHOLHDL 2.4 12/05/2022 1008   VLDL 22 11/06/2016 0910   LDLCALC 58 12/05/2022 1008    Physical Exam:    VS:  BP 126/80 (BP Location: Right Arm, Patient Position: Sitting, Cuff Size: Normal)   Pulse 76   Ht 6\' 4"  (1.93 m)   Wt 187 lb 6.4 oz (85 kg)   SpO2 98%   BMI 22.81 kg/m     Wt Readings from Last 3 Encounters:  04/28/23 187 lb 6.4 oz (85 kg)  04/04/23 182 lb (82.6 kg)  03/20/23 182 lb (82.6 kg)     GEN:  Well nourished, well developed in no acute distress HEENT: Normal NECK: No JVD; No carotid bruits LYMPHATICS: No lymphadenopathy CARDIAC: RRR, no murmurs, rubs, gallops RESPIRATORY:  Clear to auscultation without rales, wheezing or rhonchi  ABDOMEN: Soft, non-tender, non-distended MUSCULOSKELETAL:  No edema; No deformity  SKIN: Warm and dry NEUROLOGIC:  Alert and oriented x 3 PSYCHIATRIC:  Normal affect   ASSESSMENT:    1. Chronic combined systolic and diastolic heart failure (HCC)   2. Atrial flutter, unspecified type (HCC)   3. Frequent PVCs   4. ASD (atrial septal defect)   5. Hyperlipidemia, unspecified hyperlipidemia type     PLAN:    Chronic combined systolic and diastolic heart failure: EF 45 to 50% on echo 10/2022.  Echocardiogram 04/04/2023 showed EF 25 to 30%, mild RV dysfunction, my left atrial lodgment, mild mitral regurgitation, small secundum ASD. -Continue Toprol-XL 50 mg daily -Recommend stopping losartan and starting Entresto 24-26 mg twice daily -Recommend ischemic evaluation with LHC/RHC.  Would also recommend shunt run to evaluate ASD.  Risks and benefits of cardiac catheterization have been discussed with the patient.  These include bleeding, infection, kidney damage, stroke,  heart attack,  death.  The patient understands these risks and is willing to proceed.  Atrial flutter: Diagnosed 10/2022.  Underwent DCCV at that time.  Has followed with EP, sees Dr. [name] -Continue Eliquis -Continue Toprol-XL, increase to 50 mg daily  Heart block: Status post PPM.  Follows with EP  Frequent PVCs: 11% on recent device interrogation.  Increased Toprol-XL to 50 mg daily  ASD: Small secundum ASD with left to right shunting noted on echocardiogram 10/2022.  Recommend cardiac MRI for further evaluation  Hyperlipidemia: On atorvastatin 10 mg daily.  LDL 58 on 12/05/2022  RTC in 1 week  Medication Adjustments/Labs and Tests Ordered: Current medicines are reviewed at length with the patient today.  Concerns regarding medicines are outlined above.  Orders Placed This Encounter  Procedures   CBC   Basic Metabolic Panel (BMET)   LEFT AND RIGHT HEART CATHETERIZATION WITH CORONARY ANGIOGRAM   Meds ordered this encounter  Medications   DISCONTD: sacubitril-valsartan (ENTRESTO) 24-26 mg per tablet    Order Specific Question:   ACE-inhibitors have NOT been administered in the past 36-hours.    Answer:   YES (confirmed by ordering provider)    Patient Instructions  Medication Instructions:  Stop losartan  Start Entresto 24/26 twice A DAY *If you need a refill on your cardiac medications before your next appointment, please call your pharmacy*   Lab Work: Cbc, bmet today If you have labs (blood work) drawn today and your tests are completely normal, you will receive your results only by: MyChart Message (if you have MyChart) OR A paper copy in the mail If you have any lab test that is abnormal or we need to change your treatment, we will call you to review the results.   Testing/Procedures: Left and right cath. Hs been schedule for Friday Nov 15 please be there at 10:00 for procedure at 12:00   Follow-Up: At [facility], you and your health needs are our  priority.  As part of our continuing mission to provide you with exceptional heart care, we have created designated Provider Care Teams.  These Care Teams include your primary Cardiologist (physician) and Advanced Practice Providers (APPs -  Physician Assistants and Nurse Practitioners) who all work together to provide you with the care you need, when you need it.  We recommend signing up for the patient portal called "MyChart".  Sign up information is provided on this After Visit Summary.  MyChart is used to connect with patients for Virtual Visits (Telemedicine).  Patients are able to view lab/test results, encounter notes, upcoming appointments, etc.  Non-urgent messages can be sent to your provider as well.   To learn more about what you can do with MyChart, go to [email or web address].    Your next appointment:   Nov 19@ 4:30 pm  Provider:   Little Ishikawa, MD     Other Instructions  [facility] A DEPT OF [place]. [facility] AT [facility] [address] [place] [address] [place] [address] Dept: [phone] Loc: [phone]  Carlos Choi  04/28/2023  You are scheduled for a Cardiac Catheterization on Friday, November 15 with Dr. Peter Swaziland.  1. Please arrive at the [facility] (Main Entrance A) at [facility]: [address] [place], [place] [address] at 10:00 AM (This time is 2 hour(s) before your procedure to ensure your preparation). Free valet parking service is available. You will check in at ADMITTING. The support person will be asked  to wait in the waiting room.  It is OK to have someone drop you off and come back when you are ready to be discharged.    Special note: Every effort is made to have your procedure done on time. Please understand that emergencies sometimes delay scheduled procedures.  2. Diet: Do not eat solid foods after midnight.  The patient may have clear liquids until 5am upon the day of  the procedure.  3. Labs: You will need to have blood drawn on cbc, bmet today  . Medication instructions in preparation for your procedure: hold Eliquis  two day  before your  procedure   Contrast Allergy: No   Stop taking Eliquis (Apixiban) on Friday, November 15.     On the morning of your procedure, take your Aspirin 81 mg and any morning medicines NOT listed above.  You may use sips of water.  5. Plan to go home the same day, you will only stay overnight if medically necessary. 6. Bring a current list of your medications and current insurance cards. 7. You MUST have a responsible person to drive you home. 8. Someone MUST be with you the first 24 hours after you arrive home or your discharge will be delayed. 9. Please wear clothes that are easy to get on and off and wear slip-on shoes.  Thank you for allowing [place] to care for you!   -- [facility] Invasive Cardiovascular services    Signed, Little Ishikawa, MD  04/28/2023 11:53 AM    [facility]

## 2023-04-28 ENCOUNTER — Ambulatory Visit: Payer: Medicare Other | Attending: Cardiology | Admitting: Cardiology

## 2023-04-28 ENCOUNTER — Other Ambulatory Visit: Payer: Self-pay | Admitting: *Deleted

## 2023-04-28 ENCOUNTER — Encounter: Payer: Self-pay | Admitting: Cardiology

## 2023-04-28 VITALS — BP 126/80 | HR 76 | Ht 76.0 in | Wt 187.4 lb

## 2023-04-28 DIAGNOSIS — I493 Ventricular premature depolarization: Secondary | ICD-10-CM | POA: Diagnosis not present

## 2023-04-28 DIAGNOSIS — I4892 Unspecified atrial flutter: Secondary | ICD-10-CM

## 2023-04-28 DIAGNOSIS — E785 Hyperlipidemia, unspecified: Secondary | ICD-10-CM

## 2023-04-28 DIAGNOSIS — Q211 Atrial septal defect, unspecified: Secondary | ICD-10-CM | POA: Diagnosis not present

## 2023-04-28 DIAGNOSIS — I5042 Chronic combined systolic (congestive) and diastolic (congestive) heart failure: Secondary | ICD-10-CM | POA: Diagnosis not present

## 2023-04-28 MED ORDER — ENTRESTO 24-26 MG PO TABS: 1.0000 | ORAL_TABLET | Freq: Two times a day (BID) | ORAL | 3 refills | Status: DC

## 2023-04-28 MED ORDER — SACUBITRIL-VALSARTAN 24-26 MG PO TABS
1.0000 | ORAL_TABLET | Freq: Two times a day (BID) | ORAL | Status: DC
Start: 2023-04-28 — End: 2023-04-28

## 2023-04-28 NOTE — Patient Instructions (Signed)
Medication Instructions:  Stop losartan  Start Entresto 24/26 twice A DAY *If you need a refill on your cardiac medications before your next appointment, please call your pharmacy*   Lab Work: Cbc, bmet today If you have labs (blood work) drawn today and your tests are completely normal, you will receive your results only by: MyChart Message (if you have MyChart) OR A paper copy in the mail If you have any lab test that is abnormal or we need to change your treatment, we will call you to review the results.   Testing/Procedures: Left and right cath. Hs been schedule for Friday Nov 15 please be there at 10:00 for procedure at 12:00   Follow-Up: At [facility], you and your health needs are our priority.  As part of our continuing mission to provide you with exceptional heart care, we have created designated Provider Care Teams.  These Care Teams include your primary Cardiologist (physician) and Advanced Practice Providers (APPs -  Physician Assistants and Nurse Practitioners) who all work together to provide you with the care you need, when you need it.  We recommend signing up for the patient portal called "MyChart".  Sign up information is provided on this After Visit Summary.  MyChart is used to connect with patients for Virtual Visits (Telemedicine).  Patients are able to view lab/test results, encounter notes, upcoming appointments, etc.  Non-urgent messages can be sent to your provider as well.   To learn more about what you can do with MyChart, go to [email or web address].    Your next appointment:   Nov 19@ 4:30 pm  Provider:   Little Ishikawa, MD     Other Instructions  [facility] A DEPT OF [place]. [facility] AT [facility] [address] [place] [address] [place] [address] Dept: [phone] Loc: [phone]  Carlos Choi  04/28/2023  You are scheduled for a Cardiac Catheterization on Friday,  November 15 with Dr. Peter Swaziland.  1. Please arrive at the [facility] (Main Entrance A) at [facility]: [address] [place], [place] [address] at 10:00 AM (This time is 2 hour(s) before your procedure to ensure your preparation). Free valet parking service is available. You will check in at ADMITTING. The support person will be asked to wait in the waiting room.  It is OK to have someone drop you off and come back when you are ready to be discharged.    Special note: Every effort is made to have your procedure done on time. Please understand that emergencies sometimes delay scheduled procedures.  2. Diet: Do not eat solid foods after midnight.  The patient may have clear liquids until 5am upon the day of the procedure.  3. Labs: You will need to have blood drawn on cbc, bmet today  . Medication instructions in preparation for your procedure: hold Eliquis  two day  before your  procedure   Contrast Allergy: No   Stop taking Eliquis (Apixiban) on Friday, November 15.     On the morning of your procedure, take your Aspirin 81 mg and any morning medicines NOT listed above.  You may use sips of water.  5. Plan to go home the same day, you will only stay overnight if medically necessary. 6. Bring a current list of your medications and current insurance cards. 7. You MUST have a responsible person to drive you home. 8. Someone MUST be with you the first 24 hours after  you arrive home or your discharge will be delayed. 9. Please wear clothes that are easy to get on and off and wear slip-on shoes.  Thank you for allowing [place] to care for you!   -- [place] Invasive Cardiovascular services

## 2023-04-29 ENCOUNTER — Other Ambulatory Visit: Payer: Self-pay

## 2023-04-29 DIAGNOSIS — E875 Hyperkalemia: Secondary | ICD-10-CM

## 2023-04-29 LAB — CBC
Hematocrit: 46.1 % (ref 37.5–51.0)
Hemoglobin: 15.2 g/dL (ref 13.0–17.7)
MCH: 32.2 pg (ref 26.6–33.0)
MCHC: 33 g/dL (ref 31.5–35.7)
MCV: 98 fL — ABNORMAL HIGH (ref 79–97)
Platelets: 144 10*3/uL — ABNORMAL LOW (ref 150–450)
RBC: 4.72 x10E6/uL (ref 4.14–5.80)
RDW: 12.2 % (ref 11.6–15.4)
WBC: 7 10*3/uL (ref 3.4–10.8)

## 2023-04-29 LAB — BASIC METABOLIC PANEL
BUN/Creatinine Ratio: 19 (ref 10–24)
BUN: 19 mg/dL (ref 8–27)
CO2: 24 mmol/L (ref 20–29)
Calcium: 9.3 mg/dL (ref 8.6–10.2)
Chloride: 107 mmol/L — ABNORMAL HIGH (ref 96–106)
Creatinine, Ser: 0.98 mg/dL (ref 0.76–1.27)
Glucose: 91 mg/dL (ref 70–99)
Potassium: 5.3 mmol/L — ABNORMAL HIGH (ref 3.5–5.2)
Sodium: 144 mmol/L (ref 134–144)
eGFR: 80 mL/min/{1.73_m2} (ref >59)

## 2023-05-01 ENCOUNTER — Telehealth: Payer: Self-pay | Admitting: *Deleted

## 2023-05-01 DIAGNOSIS — E875 Hyperkalemia: Secondary | ICD-10-CM | POA: Diagnosis not present

## 2023-05-01 NOTE — Telephone Encounter (Signed)
Patient asked me to mention to Cath Lab staff that his pacemaker is on his right side and not his left side. Patient reports pacemaker placement on left side was attempted , but was not successful, and had to place pacemaker on right side.  See 12/26/22 Pacemaker Implant Note by Dr [name] for details.

## 2023-05-01 NOTE — Telephone Encounter (Signed)
Cardiac Catheterization scheduled at [facility] for: May 02, 2023 12 Noon Arrival time [facility] Main Entrance A at: 10 AM  Nothing to eat after midnight prior to procedure, clear liquids until 5 AM day of procedure.  Medication instructions: -Hold:  Eliquis-none 04/30/23 until post procedure -Other usual morning medications can be taken with sips of water including aspirin 81 mg.  Plan to go home the same day, you will only stay overnight if medically necessary.  You must have responsible adult to drive you home.  Someone must be with you the first 24 hours after you arrive home.  Reviewed procedure instructions with patient.

## 2023-05-02 ENCOUNTER — Encounter (HOSPITAL_COMMUNITY): Payer: Self-pay | Admitting: Cardiology

## 2023-05-02 ENCOUNTER — Other Ambulatory Visit: Payer: Self-pay

## 2023-05-02 ENCOUNTER — Other Ambulatory Visit (HOSPITAL_COMMUNITY): Payer: Self-pay

## 2023-05-02 ENCOUNTER — Ambulatory Visit (HOSPITAL_COMMUNITY)
Admission: RE | Admit: 2023-05-02 | Discharge: 2023-05-02 | Disposition: A | Discharge status: Home / Self Care | Payer: Medicare Other | Source: Ambulatory Visit | Attending: Cardiology | Admitting: Cardiology

## 2023-05-02 ENCOUNTER — Ambulatory Visit (HOSPITAL_COMMUNITY)
Admission: RE | Disposition: A | Discharge status: Home / Self Care | Payer: Self-pay | Source: Ambulatory Visit | Attending: Cardiology

## 2023-05-02 DIAGNOSIS — I459 Conduction disorder, unspecified: Secondary | ICD-10-CM | POA: Insufficient documentation

## 2023-05-02 DIAGNOSIS — I2510 Atherosclerotic heart disease of native coronary artery without angina pectoris: Secondary | ICD-10-CM | POA: Diagnosis not present

## 2023-05-02 DIAGNOSIS — Z8774 Personal history of (corrected) congenital malformations of heart and circulatory system: Secondary | ICD-10-CM | POA: Diagnosis not present

## 2023-05-02 DIAGNOSIS — I5043 Acute on chronic combined systolic (congestive) and diastolic (congestive) heart failure: Secondary | ICD-10-CM | POA: Diagnosis not present

## 2023-05-02 DIAGNOSIS — I442 Atrioventricular block, complete: Secondary | ICD-10-CM | POA: Diagnosis not present

## 2023-05-02 DIAGNOSIS — Z79899 Other long term (current) drug therapy: Secondary | ICD-10-CM | POA: Diagnosis not present

## 2023-05-02 DIAGNOSIS — I5023 Acute on chronic systolic (congestive) heart failure: Secondary | ICD-10-CM

## 2023-05-02 DIAGNOSIS — E785 Hyperlipidemia, unspecified: Secondary | ICD-10-CM | POA: Diagnosis not present

## 2023-05-02 DIAGNOSIS — I272 Pulmonary hypertension, unspecified: Secondary | ICD-10-CM | POA: Diagnosis not present

## 2023-05-02 DIAGNOSIS — I110 Hypertensive heart disease with heart failure: Secondary | ICD-10-CM | POA: Diagnosis not present

## 2023-05-02 DIAGNOSIS — Z7901 Long term (current) use of anticoagulants: Secondary | ICD-10-CM | POA: Diagnosis not present

## 2023-05-02 DIAGNOSIS — Z87891 Personal history of nicotine dependence: Secondary | ICD-10-CM | POA: Diagnosis not present

## 2023-05-02 DIAGNOSIS — Z955 Presence of coronary angioplasty implant and graft: Secondary | ICD-10-CM | POA: Diagnosis not present

## 2023-05-02 DIAGNOSIS — E039 Hypothyroidism, unspecified: Secondary | ICD-10-CM | POA: Insufficient documentation

## 2023-05-02 DIAGNOSIS — I4892 Unspecified atrial flutter: Secondary | ICD-10-CM | POA: Insufficient documentation

## 2023-05-02 DIAGNOSIS — Z950 Presence of cardiac pacemaker: Secondary | ICD-10-CM | POA: Insufficient documentation

## 2023-05-02 HISTORY — PX: CORONARY STENT INTERVENTION: CATH118234

## 2023-05-02 HISTORY — PX: RIGHT/LEFT HEART CATH AND CORONARY ANGIOGRAPHY: CATH118266

## 2023-05-02 LAB — POCT I-STAT EG7
Acid-base deficit: 3 mmol/L — ABNORMAL HIGH (ref 0.0–2.0)
Acid-base deficit: 1 mmol/L (ref 0.0–2.0)
Acid-base deficit: 2 mmol/L (ref 0.0–2.0)
Acid-base deficit: 3 mmol/L — ABNORMAL HIGH (ref 0.0–2.0)
Acid-base deficit: 3 mmol/L — ABNORMAL HIGH (ref 0.0–2.0)
Bicarbonate: 21.3 mmol/L (ref 20.0–28.0)
Bicarbonate: 21.1 mmol/L (ref 20.0–28.0)
Bicarbonate: 21.8 mmol/L (ref 20.0–28.0)
Bicarbonate: 22.3 mmol/L (ref 20.0–28.0)
Bicarbonate: 22.7 mmol/L (ref 20.0–28.0)
Calcium, Ion: 1.21 mmol/L (ref 1.15–1.40)
Calcium, Ion: 1.21 mmol/L (ref 1.15–1.40)
Calcium, Ion: 1.21 mmol/L (ref 1.15–1.40)
Calcium, Ion: 1.21 mmol/L (ref 1.15–1.40)
Calcium, Ion: 1.22 mmol/L (ref 1.15–1.40)
HCT: 43 % (ref 39.0–52.0)
HCT: 42 % (ref 39.0–52.0)
HCT: 43 % (ref 39.0–52.0)
HCT: 43 % (ref 39.0–52.0)
HCT: 43 % (ref 39.0–52.0)
Hemoglobin: 14.6 g/dL (ref 13.0–17.0)
Hemoglobin: 14.3 g/dL (ref 13.0–17.0)
Hemoglobin: 14.6 g/dL (ref 13.0–17.0)
Hemoglobin: 14.6 g/dL (ref 13.0–17.0)
Hemoglobin: 14.6 g/dL (ref 13.0–17.0)
O2 Saturation: 72 %
O2 Saturation: 66 %
O2 Saturation: 68 %
O2 Saturation: 69 %
O2 Saturation: 73 %
Potassium: 4.1 mmol/L (ref 3.5–5.1)
Potassium: 4 mmol/L (ref 3.5–5.1)
Potassium: 4 mmol/L (ref 3.5–5.1)
Potassium: 4 mmol/L (ref 3.5–5.1)
Potassium: 4 mmol/L (ref 3.5–5.1)
Sodium: 141 mmol/L (ref 135–145)
Sodium: 141 mmol/L (ref 135–145)
Sodium: 141 mmol/L (ref 135–145)
Sodium: 141 mmol/L (ref 135–145)
Sodium: 142 mmol/L (ref 135–145)
TCO2: 22 mmol/L (ref 22–32)
TCO2: 22 mmol/L (ref 22–32)
TCO2: 23 mmol/L (ref 22–32)
TCO2: 23 mmol/L (ref 22–32)
TCO2: 24 mmol/L (ref 22–32)
pCO2, Ven: 35.9 mm[Hg] — ABNORMAL LOW (ref 44–60)
pCO2, Ven: 34.2 mmHg — ABNORMAL LOW (ref 44–60)
pCO2, Ven: 34.8 mmHg — ABNORMAL LOW (ref 44–60)
pCO2, Ven: 35.9 mmHg — ABNORMAL LOW (ref 44–60)
pCO2, Ven: 36.4 mmHg — ABNORMAL LOW (ref 44–60)
pH, Ven: 7.381 (ref 7.25–7.43)
pH, Ven: 7.391 (ref 7.25–7.43)
pH, Ven: 7.392 (ref 7.25–7.43)
pH, Ven: 7.395 (ref 7.25–7.43)
pH, Ven: 7.429 (ref 7.25–7.43)
pO2, Ven: 38 mm[Hg] (ref 32–45)
pO2, Ven: 34 mmHg (ref 32–45)
pO2, Ven: 35 mmHg (ref 32–45)
pO2, Ven: 36 mmHg (ref 32–45)
pO2, Ven: 38 mmHg (ref 32–45)

## 2023-05-02 LAB — POCT I-STAT 7, (LYTES, BLD GAS, ICA,H+H)
Acid-base deficit: 3 mmol/L — ABNORMAL HIGH (ref 0.0–2.0)
Bicarbonate: 20.2 mmol/L (ref 20.0–28.0)
Calcium, Ion: 1.15 mmol/L (ref 1.15–1.40)
HCT: 42 % (ref 39.0–52.0)
Hemoglobin: 14.3 g/dL (ref 13.0–17.0)
O2 Saturation: 95 %
Potassium: 4 mmol/L (ref 3.5–5.1)
Sodium: 142 mmol/L (ref 135–145)
TCO2: 21 mmol/L — ABNORMAL LOW (ref 22–32)
pCO2 arterial: 30.9 mm[Hg] — ABNORMAL LOW (ref 32–48)
pH, Arterial: 7.422 (ref 7.35–7.45)
pO2, Arterial: 74 mm[Hg] — ABNORMAL LOW (ref 83–108)

## 2023-05-02 LAB — BASIC METABOLIC PANEL
BUN/Creatinine Ratio: 14 (ref 10–24)
BUN: 14 mg/dL (ref 8–27)
CO2: 23 mmol/L (ref 20–29)
Calcium: 9.4 mg/dL (ref 8.6–10.2)
Chloride: 104 mmol/L (ref 96–106)
Creatinine, Ser: 1.03 mg/dL (ref 0.76–1.27)
Glucose: 84 mg/dL (ref 70–99)
Potassium: 5 mmol/L (ref 3.5–5.2)
Sodium: 141 mmol/L (ref 134–144)
eGFR: 76 mL/min/{1.73_m2} (ref >59)

## 2023-05-02 LAB — POCT ACTIVATED CLOTTING TIME: Activated Clotting Time: 354 s

## 2023-05-02 SURGERY — RIGHT/LEFT HEART CATH AND CORONARY ANGIOGRAPHY
Anesthesia: LOCAL

## 2023-05-02 MED ORDER — SODIUM CHLORIDE 0.9% FLUSH
3.0000 mL | INTRAVENOUS | Status: DC | PRN
Start: 2023-05-02 — End: 2023-05-02

## 2023-05-02 MED ORDER — LIDOCAINE HCL (PF) 1 % IJ SOLN
INTRAMUSCULAR | Status: AC
  Filled 2023-05-02: qty 30

## 2023-05-02 MED ORDER — HEPARIN SODIUM (PORCINE) 1000 UNIT/ML IJ SOLN
INTRAMUSCULAR | Status: DC | PRN
  Administered 2023-05-02 (×2): 4000 [IU] via INTRAVENOUS

## 2023-05-02 MED ORDER — VERAPAMIL HCL 2.5 MG/ML IV SOLN
INTRAVENOUS | Status: AC
  Filled 2023-05-02: qty 2

## 2023-05-02 MED ORDER — NITROGLYCERIN 1 MG/10 ML FOR IR/CATH LAB
INTRA_ARTERIAL | Status: AC
  Filled 2023-05-02: qty 10

## 2023-05-02 MED ORDER — FENTANYL CITRATE (PF) 100 MCG/2ML IJ SOLN
INTRAMUSCULAR | Status: AC
  Filled 2023-05-02: qty 2

## 2023-05-02 MED ORDER — VERAPAMIL HCL 2.5 MG/ML IV SOLN
INTRAVENOUS | Status: DC | PRN
  Administered 2023-05-02: 10 mL via INTRA_ARTERIAL

## 2023-05-02 MED ORDER — SODIUM CHLORIDE 0.9 % IV SOLN
250.0000 mL | INTRAVENOUS | Status: DC | PRN
Start: 2023-05-02 — End: 2023-05-02

## 2023-05-02 MED ORDER — MIDAZOLAM HCL 2 MG/2ML IJ SOLN
INTRAMUSCULAR | Status: DC | PRN
  Administered 2023-05-02: 1 mg via INTRAVENOUS

## 2023-05-02 MED ORDER — CLOPIDOGREL BISULFATE 300 MG PO TABS
ORAL_TABLET | ORAL | Status: AC
  Filled 2023-05-02: qty 2

## 2023-05-02 MED ORDER — ONDANSETRON HCL 4 MG/2ML IJ SOLN: 4.0000 mg | Freq: Four times a day (QID) | INTRAMUSCULAR | Status: DC | PRN

## 2023-05-02 MED ORDER — SODIUM CHLORIDE 0.9 % IV SOLN: INTRAVENOUS | Status: DC

## 2023-05-02 MED ORDER — SODIUM CHLORIDE 0.9% FLUSH
3.0000 mL | Freq: Two times a day (BID) | INTRAVENOUS | Status: DC
Start: 2023-05-02 — End: 2023-05-02

## 2023-05-02 MED ORDER — PANTOPRAZOLE SODIUM 40 MG PO TBEC
40.0000 mg | DELAYED_RELEASE_TABLET | Freq: Every day | ORAL | 0 refills | Status: DC
  Filled 2023-05-02: qty 30, 30d supply, fill #0

## 2023-05-02 MED ORDER — LABETALOL HCL 5 MG/ML IV SOLN: 10.0000 mg | INTRAVENOUS | Status: AC | PRN

## 2023-05-02 MED ORDER — FAMOTIDINE IN NACL 20-0.9 MG/50ML-% IV SOLN
INTRAVENOUS | Status: DC | PRN
  Administered 2023-05-02: 20 mg via INTRAVENOUS

## 2023-05-02 MED ORDER — HEPARIN (PORCINE) IN NACL 1000-0.9 UT/500ML-% IV SOLN
INTRAVENOUS | Status: DC | PRN
  Administered 2023-05-02 (×2): 500 mL

## 2023-05-02 MED ORDER — HYDRALAZINE HCL 20 MG/ML IJ SOLN: 10.0000 mg | INTRAMUSCULAR | Status: AC | PRN

## 2023-05-02 MED ORDER — ATORVASTATIN CALCIUM 40 MG PO TABS
40.0000 mg | ORAL_TABLET | Freq: Every day | ORAL | 3 refills | Status: DC
  Filled 2023-05-02: qty 30, 30d supply, fill #0

## 2023-05-02 MED ORDER — HEPARIN SODIUM (PORCINE) 1000 UNIT/ML IJ SOLN
INTRAMUSCULAR | Status: AC
  Filled 2023-05-02: qty 10

## 2023-05-02 MED ORDER — FAMOTIDINE IN NACL 20-0.9 MG/50ML-% IV SOLN
INTRAVENOUS | Status: AC
  Filled 2023-05-02: qty 50

## 2023-05-02 MED ORDER — LIDOCAINE HCL (PF) 1 % IJ SOLN
INTRAMUSCULAR | Status: DC | PRN
  Administered 2023-05-02 (×2): 5 mL via INTRADERMAL

## 2023-05-02 MED ORDER — CLOPIDOGREL BISULFATE 75 MG PO TABS
75.0000 mg | ORAL_TABLET | Freq: Every day | ORAL | 1 refills | Status: DC
  Filled 2023-05-02: qty 90, 90d supply, fill #0

## 2023-05-02 MED ORDER — FENTANYL CITRATE (PF) 100 MCG/2ML IJ SOLN
INTRAMUSCULAR | Status: DC | PRN
  Administered 2023-05-02: 25 ug via INTRAVENOUS

## 2023-05-02 MED ORDER — ASPIRIN 81 MG PO CHEW: 81.0000 mg | CHEWABLE_TABLET | ORAL | Status: DC

## 2023-05-02 MED ORDER — NITROGLYCERIN 0.4 MG SL SUBL
0.4000 mg | SUBLINGUAL_TABLET | SUBLINGUAL | 3 refills | Status: DC | PRN
  Filled 2023-05-02: qty 25, 8d supply, fill #0

## 2023-05-02 MED ORDER — IOHEXOL 350 MG/ML SOLN
INTRAVENOUS | Status: DC | PRN
  Administered 2023-05-02: 90 mL

## 2023-05-02 MED ORDER — CLOPIDOGREL BISULFATE 300 MG PO TABS
ORAL_TABLET | ORAL | Status: DC | PRN
  Administered 2023-05-02: 600 mg via ORAL

## 2023-05-02 MED ORDER — ACETAMINOPHEN 325 MG PO TABS: 650.0000 mg | ORAL_TABLET | ORAL | Status: DC | PRN

## 2023-05-02 MED ORDER — MIDAZOLAM HCL 2 MG/2ML IJ SOLN
INTRAMUSCULAR | Status: AC
Start: 2023-05-02 — End: ?
  Filled 2023-05-02: qty 2

## 2023-05-02 MED ORDER — ASPIRIN 81 MG PO CHEW: 81.0000 mg | CHEWABLE_TABLET | Freq: Every day | ORAL | Status: DC

## 2023-05-02 MED ORDER — ASPIRIN 81 MG PO TBEC
81.0000 mg | DELAYED_RELEASE_TABLET | Freq: Every day | ORAL | 0 refills | Status: AC
Start: 2023-05-02 — End: 2023-06-01
  Filled 2023-05-02: qty 30, 30d supply, fill #0

## 2023-05-02 MED ORDER — CLOPIDOGREL BISULFATE 75 MG PO TABS: 75.0000 mg | ORAL_TABLET | Freq: Every day | ORAL | Status: DC

## 2023-05-02 SURGICAL SUPPLY — 23 items
BALLN EMERGE MR 2.5X12 (BALLOONS) ×1
BALLN ~~LOC~~ EMERGE MR 3.25X8 (BALLOONS) ×1
BALLOON EMERGE MR 2.5X12 (BALLOONS) IMPLANT
BALLOON ~~LOC~~ EMERGE MR 3.25X8 (BALLOONS) IMPLANT
CATH 5FR JL3.5 JR4 ANG PIG MP (CATHETERS) IMPLANT
CATH SWAN GANZ 7F STRAIGHT (CATHETERS) IMPLANT
CATH VISTA GUIDE 6FR XBLAD3.5 (CATHETERS) IMPLANT
DEVICE RAD COMP TR BAND LRG (VASCULAR PRODUCTS) IMPLANT
GLIDESHEATH SLEND SS 6F .021 (SHEATH) IMPLANT
GUIDEWIRE .025 260CM (WIRE) IMPLANT
GUIDEWIRE INQWIRE 1.5J.035X260 (WIRE) IMPLANT
INQWIRE 1.5J .035X260CM (WIRE) ×2
KIT ENCORE 26 ADVANTAGE (KITS) IMPLANT
KIT MICROPUNCTURE NIT STIFF (SHEATH) IMPLANT
KIT SINGLE USE MANIFOLD (KITS) IMPLANT
PACK CARDIAC CATHETERIZATION (CUSTOM PROCEDURE TRAY) ×1 IMPLANT
SET ATX-X65L (MISCELLANEOUS) IMPLANT
SHEATH GLIDE SLENDER 4/5FR (SHEATH) IMPLANT
SHEATH PINNACLE 7F 10CM (SHEATH) IMPLANT
STENT SYNERGY XD 3.0X12 (Permanent Stent) IMPLANT
SYNERGY XD 3.0X12 (Permanent Stent) ×1 IMPLANT
TUBING CIL FLEX 10 FLL-RA (TUBING) IMPLANT
WIRE ASAHI PROWATER 180CM (WIRE) IMPLANT

## 2023-05-02 NOTE — Progress Notes (Signed)
Patient and wife was given discharge instructions. Both verbalized understanding. 

## 2023-05-02 NOTE — Progress Notes (Signed)
Patient walked to the bathroom without difficulties. groin site level 0, clean, dry, and intact.

## 2023-05-02 NOTE — Discharge Summary (Signed)
Discharge Summary for Same Day PCI   Patient ID: Carlos Choi MRN: [id number]; DOB: 06-Apr-1946  Admit date: 05/02/2023 Discharge date: 05/02/2023  Primary Care Provider: [name], MD  Primary Cardiologist: Little Ishikawa, MD  Primary Electrophysiologist:  None   Discharge Diagnoses    Active Problems:   * No active hospital problems. *    Diagnostic Studies/Procedures    Cardiac Catheterization 05/02/2023:  Single vessel occlusive CAD with 99% stenosis in the mid LAD which is a large vessel Mildly elevated LV filling pressures. LVEDP 21 mm Hg. PCWP 25/33, mean 23 mm Hg Mild pulmonary HTN. PAP 41/25 mean 30 mm Hg No significant shunt Normal cardiac output 6.27 L/min, index 2.93 Successful PCI of the mid LAD with DES     Plan: DAPT with ASA for one month, Plavix for 6 months. OK to resume Eliquis this evening. Anticipate same day DC.   Diagnostic Dominance: Left  Intervention   _____________   History of Present Illness     Carlos Choi is a 77 y.o. male with a hx of atrial flutter, ASD, heart block status post PPM, bronchiectasis, hypertension, tobacco use, hypothyroidism. Patient was referred to Dr. Bjorn Pippin for evaluation of heart failure. He was initially diagnosed with atrial flutter 10/2022. At that time he was started on Eliquis and underwent DCCV. Presented with complete heart block and underwent PPM 12/2022. Echocardiogram 11/15/2022 showed EF 45 to 50%, normal RV function, moderate RV enlargement, moderate left atrial enlargement, severe right atrial enlargement, moderate MR, small secundum ASD with left-to-right shunting. Echocardiogram 04/04/2023 showed EF 25 to 30%, mild RV dysfunction, mild mitral regurgitation, small secundum ASD. Given heart failure, left and right heart catheterization arranged.   Cardiac catheterization was arranged for further evaluation.  Hospital Course     The patient underwent cardiac cath as noted above  with Dr. [place]. Plan for triple therapy with ASA/Plavix/Eliquis for one month, then Eliquis/Plavix for at least 6 months. The patient was seen by cardiac rehab while in short stay. There were no observed complications post cath. Radial cath site was re-evaluated prior to discharge and found to be stable without any complications. Right groin access also stable without complications. Instructions/precautions regarding cath site care were given prior to discharge.  Carlos Choi was seen by Dr. [place] and determined stable for discharge home. Follow up with our office has been arranged. Medications are listed below. Pertinent changes include:  Plan for triple therapy with ASA/Plavix/Eliquis for one month, then Eliquis/Plavix for at least 6 months. Protonix started for GI protection on triple therapy. Increase Atorvastatin to 40mg  with LDL goal <55 given multi-vessel CAD.       _____________  Cath/PCI Registry Performance & Quality Measures: Aspirin prescribed? - Yes ADP Receptor Inhibitor (Plavix/Clopidogrel, Brilinta/Ticagrelor or Effient/Prasugrel) prescribed (includes medically managed patients)? - Yes High Intensity Statin (Lipitor 40-80mg  or Crestor 20-40mg ) prescribed? - Yes For EF <40%, was ACEI/ARB prescribed? - Yes For EF <40%, Aldosterone Antagonist (Spironolactone or Eplerenone) prescribed? - No - Reason:  hyperkalemia Cardiac Rehab Phase II ordered (Included Medically managed Patients)? - Yes  _____________   Discharge Vitals Blood pressure 120/89, pulse 73, temperature 98.4 F (36.9 C), temperature source Temporal, resp. rate 20, height 6\' 4"  (1.93 m), weight 83.9 kg, SpO2 99%.  Filed Weights   05/02/23 1019  Weight: 83.9 kg    Last Labs & Radiologic Studies    CBC Recent Labs    05/02/23 1132 05/02/23 1136  HGB 14.6  14.6  14.3  HCT 43.0 43.0  42.0   Basic Metabolic Panel Recent Labs    16/10/96 1050 05/02/23 1128 05/02/23 1132 05/02/23 1136  NA 141   <  > 141 141  141  K 5.0   < > 4.0 4.0  4.0  CL 104  --   --   --   CO2 23  --   --   --   GLUCOSE 84  --   --   --   BUN 14  --   --   --   CREATININE 1.03  --   --   --   CALCIUM 9.4  --   --   --    < > = values in this interval not displayed.   Liver Function Tests No results for input(s): "AST", "ALT", "ALKPHOS", "BILITOT", "PROT", "ALBUMIN" in the last 72 hours. No results for input(s): "LIPASE", "AMYLASE" in the last 72 hours. High Sensitivity Troponin:   No results for input(s): "TROPONINIHS" in the last 720 hours.  BNP Invalid input(s): "POCBNP" D-Dimer No results for input(s): "DDIMER" in the last 72 hours. Hemoglobin A1C No results for input(s): "HGBA1C" in the last 72 hours. Fasting Lipid Panel No results for input(s): "CHOL", "HDL", "LDLCALC", "TRIG", "CHOLHDL", "LDLDIRECT" in the last 72 hours. Thyroid Function Tests No results for input(s): "TSH", "T4TOTAL", "T3FREE", "THYROIDAB" in the last 72 hours.  Invalid input(s): "FREET3" _____________  CARDIAC CATHETERIZATION  Result Date: 05/02/2023 Single vessel occlusive CAD with 99% stenosis in the mid LAD which is a large vessel Mildly elevated LV filling pressures. LVEDP 21 mm Hg. PCWP 25/33, mean 23 mm Hg Mild pulmonary HTN. PAP 41/25 mean 30 mm Hg No significant shunt Normal cardiac output 6.27 L/min, index 2.93 Successful PCI of the mid LAD with DES Plan: DAPT with ASA for one month, Plavix for 6 months. OK to resume Eliquis this evening. Anticipate same day DC.   CUP PACEART INCLINIC DEVICE CHECK  Result Date: 04/13/2023 Pacemaker check in clinic. Normal device function. Thresholds, sensing, impedances consistent with previous measurements. Device programmed to maximize longevity. No mode switch or high ventricular rates noted since last remote + OAC for hx aflutter. Device programmed at appropriate safety margins. Histogram distribution appropriate for patient activity level. Device programmed to optimize intrinsic  conduction. Estimated longevity 7.8-8.5 years. Patient enrolled in remote follow-up. Patient education  completed. Reprogrammed post acute phase w/ changes as follows. Changes made per AM. RA from acute to 2--4 RV from acute to 2.5--4 ACAP confirm set to monitor in [facility], RN  ECHOCARDIOGRAM COMPLETE  Result Date: 04/04/2023    ECHOCARDIOGRAM REPORT   Patient Name:   Carlos Choi Date of Exam: 04/04/2023 Medical Rec #:  [id number]       Height:       76.0 in Accession #:    [id number]      Weight:       182.0 lb Date of Birth:  [birth year]       BSA:          2.128 m Patient Age:    77 years        BP:           106/62 mmHg Patient Gender: M               HR:           63 bpm. Exam Location:  [address] Procedure: 2D Echo, Cardiac Doppler, Color Doppler and  3D Echo Indications:    Systolic heart failure I50  History:        Patient has prior history of Echocardiogram examinations, most                 recent 11/15/2022. ASD, Pacemaker, Arrythmias:Atrial Flutter;                 Risk Factors:Hypertension.  Sonographer:    Thurman Coyer RDCS Referring Phys: [id number] CHRISTOPHER L SCHUMANN IMPRESSIONS  1. Left ventricular ejection fraction, by estimation, is 25 to 30%. Left ventricular ejection fraction by 3D volume is 26 %. The left ventricle has severely decreased function. The left ventricle demonstrates global hypokinesis. The left ventricular internal cavity size was mildly dilated. There is mild eccentric left ventricular hypertrophy. Left ventricular diastolic parameters are indeterminate.  2. Right ventricular systolic function is mildly reduced. The right ventricular size is mildly enlarged. There is normal pulmonary artery systolic pressure.  3. Left atrial size was mildly dilated.  4. The mitral valve is normal in structure. Mild mitral valve regurgitation.  5. The aortic valve is tricuspid. Aortic valve regurgitation is not visualized.  6. Evidence of atrial level shunting detected by color  flow Doppler. There is a small secundum atrial septal defect with predominantly left to right shunting across the atrial septum. Comparison(s): Changes from prior study are noted. LV function is significantly worse. FINDINGS  Left Ventricle: Left ventricular ejection fraction, by estimation, is 25 to 30%. Left ventricular ejection fraction by 3D volume is 26 %. The left ventricle has severely decreased function. The left ventricle demonstrates global hypokinesis. The left ventricular internal cavity size was mildly dilated. There is mild eccentric left ventricular hypertrophy. Left ventricular diastolic parameters are indeterminate. Right Ventricle: The right ventricular size is mildly enlarged. No increase in right ventricular wall thickness. Right ventricular systolic function is mildly reduced. There is normal pulmonary artery systolic pressure. The tricuspid regurgitant velocity  is 2.31 m/s, and with an assumed right atrial pressure of 3 mmHg, the estimated right ventricular systolic pressure is 24.3 mmHg. Left Atrium: Left atrial size was mildly dilated. Right Atrium: Right atrial size was normal in size. Pericardium: There is no evidence of pericardial effusion. Mitral Valve: The mitral valve is normal in structure. Mild mitral valve regurgitation. Tricuspid Valve: The tricuspid valve is normal in structure. Tricuspid valve regurgitation is trivial. Aortic Valve: The aortic valve is tricuspid. Aortic valve regurgitation is not visualized. Pulmonic Valve: The pulmonic valve was normal in structure. Pulmonic valve regurgitation is mild. Aorta: The aortic root and ascending aorta are structurally normal, with no evidence of dilitation. IAS/Shunts: Evidence of atrial level shunting detected by color flow Doppler. There is a small secundum atrial septal defect with predominantly left to right shunting across the atrial septum.  LEFT VENTRICLE PLAX 2D LVIDd:         5.70 cm         Diastology LVIDs:         4.90 cm          LV e' medial:    3.98 cm/s LV PW:         1.00 cm         LV E/e' medial:  10.0 LV IVS:        1.00 cm         LV e' lateral:   4.35 cm/s LVOT diam:     2.60 cm         LV E/e'  lateral: 9.1 LV SV:         63 LV SV Index:   29 LVOT Area:     5.31 cm        3D Volume EF                                LV 3D EF:    Left                                             ventricul                                             ar                                             ejection                                             fraction                                             by 3D                                             volume is                                             26 %.                                 3D Volume EF:                                3D EF:        26 %                                LV EDV:       212 ml                                LV ESV:       157 ml                                LV SV:        55 ml RIGHT VENTRICLE RV Basal diam:  3.70 cm RV Mid diam:    3.10 cm RV S prime:     7.28 cm/s TAPSE (M-mode): 1.8 cm LEFT ATRIUM              Index        RIGHT ATRIUM           Index LA diam:        4.20 cm  1.97 cm/m   RA Area:     22.60 cm LA Vol (A2C):   107.0 ml 50.28 ml/m  RA Volume:   69.10 ml  32.47 ml/m LA Vol (A4C):   52.7 ml  24.76 ml/m LA Biplane Vol: 75.3 ml  35.38 ml/m  AORTIC VALVE LVOT Vmax:   58.50 cm/s LVOT Vmean:  35.500 cm/s LVOT VTI:    0.118 m  AORTA Ao Root diam: 3.20 cm Ao Asc diam:  3.60 cm MITRAL VALVE               TRICUSPID VALVE MV Area (PHT): 1.93 cm    TR Peak grad:   21.3 mmHg MV Decel Time: 394 msec    TR Vmax:        231.00 cm/s MV E velocity: 39.80 cm/s MV A velocity: 60.30 cm/s  SHUNTS MV E/A ratio:  0.66        Systemic VTI:  0.12 m                            Systemic Diam: 2.60 cm Clearnce Hasten Electronically signed by Clearnce Hasten Signature Date/Time: 04/04/2023/4:41:01 PM    Final     Disposition   Pt is being discharged home today in good  condition.  Follow-up Plans & Appointments     Discharge Instructions     Amb Referral to Cardiac Rehabilitation   Complete by: As directed    Diagnosis:  Coronary Stents PTCA     After initial evaluation and assessments completed: Virtual Based Care may be provided alone or in conjunction with Phase 2 Cardiac Rehab based on patient barriers.: Yes   Intensive Cardiac Rehabilitation (ICR) [facility] location only OR Traditional Cardiac Rehabilitation (TCR) *If criteria for ICR are not met will enroll in TCR [facility] only): Yes        Discharge Medications   Allergies as of 05/02/2023   No Known Allergies      Medication List     TAKE these medications    acetaminophen 650 MG CR tablet Commonly known as: TYLENOL Take 650-1,300 mg by mouth every 8 (eight) hours as needed for pain.   Alka-Seltzer Gold [id number] MG Tbef Generic drug: Sod Bicarb-K Bicarb-Citric Acd Take 2 tablets by mouth daily as needed (upset stomach/indigestion.).   ALPRAZolam 0.25 MG tablet Commonly known as: XANAX Take 1 tablet (0.25 mg total) by mouth daily as needed for anxiety.   apixaban 5 MG Tabs tablet Commonly known as: Eliquis Take 1 tablet (5 mg total) by mouth 2 (two) times daily.   atorvastatin 40 MG tablet Commonly known as: LIPITOR Take 1 tablet (40 mg total) by mouth daily. What changed:  medication strength how much to take   carboxymethylcellulose 0.5 % Soln Commonly known as: REFRESH PLUS 1 drop 3 (three) times daily as needed (dry/irritated eyes.).   CENTRUM SILVER PO Take 1 tablet by mouth daily with lunch.   CULTURELLE DIGESTIVE HEALTH PO Take 1 capsule by mouth daily with lunch.   Fish Oil 1000 MG  Caps Take 2,000 mg by mouth daily with lunch.   hyoscyamine 0.125 MG tablet Commonly known as: LEVSIN Take 1 tablet (0.125 mg total) by mouth every 6 (six) hours as needed for cramping (diarrhea).   ipratropium 0.03 % nasal spray Commonly known as: ATROVENT Place 2  sprays into both nostrils daily as needed for rhinitis.   levothyroxine 100 MCG tablet Commonly known as: SYNTHROID Take 1 tablet (100 mcg total) by mouth daily.   loratadine 10 MG tablet Commonly known as: CLARITIN Take 10 mg by mouth daily with breakfast.   losartan 25 MG tablet Commonly known as: COZAAR Take 1 tablet (25 mg total) by mouth daily.   metoprolol succinate 50 MG 24 hr tablet Commonly known as: TOPROL-XL Take 1 tablet (50 mg total) by mouth daily. Take with or immediately following a meal.   MOMETASONE FURO & DIMETHICONE EX Place 2 drops into both ears daily as needed (Itching /fluid).   Mucinex Maximum Strength 1200 MG Tb12 Generic drug: Guaifenesin Take 1,200 mg by mouth 2 (two) times daily as needed (Congestion).   nitroGLYCERIN 0.4 MG SL tablet Commonly known as: NITROSTAT Place 1 tablet (0.4 mg total) under the tongue every 5 (five) minutes as needed for chest pain.   pantoprazole 40 MG tablet Commonly known as: Protonix Take 1 tablet (40 mg total) by mouth daily.   PSYLLIUM PO Take 1 packet by mouth daily as needed (Regulate bowel movement).   silodosin 8 MG Caps capsule Commonly known as: RAPAFLO Take 8 mg by mouth daily with supper.   sodium chloride 0.65 % Soln nasal spray Commonly known as: OCEAN Place 1 spray into both nostrils as needed for congestion.           Allergies No Known Allergies  Outstanding Labs/Studies    Duration of Discharge Encounter   Greater than 30 minutes including physician time.  Con Memos, PA-C 05/02/2023, 3:43 PM

## 2023-05-02 NOTE — Discharge Instructions (Signed)

## 2023-05-02 NOTE — Progress Notes (Signed)
Discussed with pt and wife stent, Plavix, restrictions, diet including low sodium, exercise, and CRPII. Many questions, receptive. Gave HF booklet as well and encouraged daily wts. Will refer to [place] CRPII.  [id number] Ethelda Chick BS, ACSM-CEP 05/02/2023 3:33 PM

## 2023-05-02 NOTE — Interval H&P Note (Signed)
History and Physical Interval Note:  05/02/2023 10:50 AM  Carlos Choi  has presented today for surgery, with the diagnosis of heart failure.  The various methods of treatment have been discussed with the patient and family. After consideration of risks, benefits and other options for treatment, the patient has consented to  Procedure(s): RIGHT/LEFT HEART CATH AND CORONARY ANGIOGRAPHY (N/A) as a surgical intervention.  [name] history has been reviewed, patient examined, no change in status, stable for surgery.  I have reviewed the patient's chart and labs.  Questions were answered to the patient's satisfaction.   Cath Lab Visit (complete for each Cath Lab visit)  Clinical Evaluation Leading to the Procedure:   ACS: No.  Non-ACS:    Anginal Classification: CCS I  Anti-ischemic medical therapy: No Therapy  Non-Invasive Test Results: No non-invasive testing performed  Prior CABG: No previous CABG        Theron Arista [facility] 05/02/2023 10:50 AM

## 2023-05-04 NOTE — Progress Notes (Unsigned)
Cardiology Office Note:    Date:  05/06/2023   ID:  Carlos Choi, DOB 04-Sep-1945, MRN [id number]  PCP:  [name], MD  Cardiologist:  Little Ishikawa, MD  Electrophysiologist:  None   Referring MD: [name], MD   Chief Complaint  Patient presents with   Congestive Heart Failure    History of Present Illness:    Carlos Choi is a 77 y.o. male with a hx of atrial flutter, ASD, heart block status post PPM, bronchiectasis, hypertension, tobacco use, hypothyroidism who is referred by Dr. [name] for evaluation of heart failure.  He was initially diagnosed with atrial flutter 10/2022.  At that time he was started on Eliquis and underwent DCCV.  Presented with complete heart block and underwent PPM 12/2022.  Echocardiogram 11/15/2022 showed EF 45 to 50%, normal RV function, moderate RV enlargement, moderate left atrial enlargement, severe right atrial enlargement, moderate MR, small secundum ASD with left-to-right shunting.  Echocardiogram 04/04/2023 showed EF 25 to 30%, mild RV dysfunction, mild mitral regurgitation, small secundum ASD.  LHC/RHC 05/02/2023 showed 99% stenosis in mid LAD status post DES, no significant shunt, mildly elevated filling pressures (RA 11, RV 43/6, PA 41/25/30, PCWP 23, CI 2.9).  Since last clinic visit, he reports he is doing well.  Denies any chest pain, dyspnea, lightheadedness, syncope, lower extremity edema, or palpitations.  Had recent nosebleed but none since.  Past Medical History:  Diagnosis Date   Allergy    Anxiety    Arthritis    mild ankles    ASD (atrial septal defect)    ASD (small secundum ASD with predominantly left-to-right shunting) 11/15/22 TTE   Atrial flutter (HCC)    s/p DCCV 11/04/22   Bronchiectasis (HCC)    Cataract    removed left eye, forming right eye    Diverticulosis    Dysrhythmia    2nd degree AV block, bradycardia s/p PPM 12/26/22; PVCs   GERD (gastroesophageal reflux disease)    Hemorrhoids     History of kidney stones    Hypertension    Hypothyroid    Presence of permanent cardiac pacemaker 12/26/2022   [employer]ott   Rectal bleeding    due to hemorrhoid    Tubular adenoma of colon 2015    Past Surgical History:  Procedure Laterality Date   CARDIOVERSION N/A 11/14/2022   Procedure: CARDIOVERSION;  Surgeon: Jake Bathe, MD;  Location: [facility] INVASIVE CV LAB;  Service: Cardiovascular;  Laterality: N/A;   CATARACT EXTRACTION Left    COLONOSCOPY     CORONARY STENT INTERVENTION N/A 05/02/2023   Procedure: CORONARY STENT INTERVENTION;  Surgeon: Swaziland, Peter M, MD;  Location: [facility] INVASIVE CV LAB;  Service: Cardiovascular;  Laterality: N/A;   CYSTOSCOPY/URETEROSCOPY/HOLMIUM LASER/STENT PLACEMENT Left 03/18/2023   Procedure: CYSTOSCOPY LEFT URETEROSCOPY/STENT PLACEMENT;  Surgeon: Bjorn Pippin, MD;  Location: [facility] ORS;  Service: Urology;  Laterality: Left;  1 HR FOR CASE   HERNIA REPAIR Bilateral    Over ten years ago per patient. He was not sure of the date.   NASAL SINUS SURGERY Left 02/05/2023   Procedure: ENDOSCOPIC MAXILLARY ANTROSTOMY WITH TISSUE REMOVAL;  Surgeon: Serena Colonel, MD;  Location: [facility] OR;  Service: ENT;  Laterality: Left;   PACEMAKER IMPLANT N/A 12/26/2022   Procedure: PACEMAKER IMPLANT;  Surgeon: Maurice Small, MD;  Location: [facility] INVASIVE CV LAB;  Service: Cardiovascular;  Laterality: N/A;   POLYPECTOMY     RIGHT/LEFT HEART CATH AND CORONARY ANGIOGRAPHY N/A 05/02/2023  Procedure: RIGHT/LEFT HEART CATH AND CORONARY ANGIOGRAPHY;  Surgeon: Swaziland, Peter M, MD;  Location: [facility] INVASIVE CV LAB;  Service: Cardiovascular;  Laterality: N/A;   ROTATOR CUFF REPAIR     both shoulders - patient does not remember the exact date    Current Medications: Current Meds  Medication Sig   acetaminophen (TYLENOL) 650 MG CR tablet Take 650-1,300 mg by mouth every 8 (eight) hours as needed for pain.   ALPRAZolam (XANAX) 0.25 MG tablet Take 1 tablet (0.25 mg total) by mouth daily as  needed for anxiety.   apixaban (ELIQUIS) 5 MG TABS tablet Take 1 tablet (5 mg total) by mouth 2 (two) times daily.   aspirin EC 81 MG tablet Take 1 tablet (81 mg total) by mouth daily. Swallow whole.   atorvastatin (LIPITOR) 40 MG tablet Take 1 tablet (40 mg total) by mouth daily.   carboxymethylcellulose (REFRESH PLUS) 0.5 % SOLN 1 drop 3 (three) times daily as needed (dry/irritated eyes.).   clopidogrel (PLAVIX) 75 MG tablet Take 1 tablet (75 mg total) by mouth daily.   empagliflozin (JARDIANCE) 10 MG TABS tablet Take 1 tablet (10 mg total) by mouth daily before breakfast.   Guaifenesin (MUCINEX MAXIMUM STRENGTH) 1200 MG TB12 Take 1,200 mg by mouth 2 (two) times daily as needed (Congestion).   ipratropium (ATROVENT) 0.03 % nasal spray Place 2 sprays into both nostrils daily as needed for rhinitis.   Lactobacillus-Inulin (CULTURELLE DIGESTIVE HEALTH PO) Take 1 capsule by mouth daily with lunch.   levothyroxine (SYNTHROID) 100 MCG tablet Take 1 tablet (100 mcg total) by mouth daily.   loratadine (CLARITIN) 10 MG tablet Take 10 mg by mouth daily with breakfast.   losartan (COZAAR) 25 MG tablet Take 1 tablet (25 mg total) by mouth daily.   metoprolol succinate (TOPROL-XL) 50 MG 24 hr tablet Take 1 tablet (50 mg total) by mouth daily. Take with or immediately following a meal.   MOMETASONE FURO & DIMETHICONE EX Place 2 drops into both ears daily as needed (Itching /fluid).   Multiple Vitamins-Minerals (CENTRUM SILVER PO) Take 1 tablet by mouth daily with lunch.   nitroGLYCERIN (NITROSTAT) 0.4 MG SL tablet Place 1 tablet (0.4 mg total) under the tongue every 5 (five) minutes as needed for chest pain.   Omega-3 Fatty Acids (FISH OIL) 1000 MG CAPS Take 2,000 mg by mouth daily with lunch.   pantoprazole (PROTONIX) 40 MG tablet Take 1 tablet (40 mg total) by mouth daily.   PSYLLIUM PO Take 1 packet by mouth daily as needed (Regulate bowel movement).   silodosin (RAPAFLO) 8 MG CAPS capsule Take 8 mg by  mouth daily with supper.   Sod Bicarb-K Bicarb-Citric Acd (ALKA-SELTZER GOLD) 1050-838 [id number] MG TBEF Take 2 tablets by mouth daily as needed (upset stomach/indigestion.).   sodium chloride (OCEAN) 0.65 % SOLN nasal spray Place 1 spray into both nostrils as needed for congestion.     Allergies:   Patient has no known allergies.   Social History   Socioeconomic History   Marital status: Married    Spouse name: [name].   Number of children: 2   Years of education: Not on file   Highest education level: Not on file  Occupational History   Not on file  Tobacco Use   Smoking status: Former    Current packs/day: 0.00    Average packs/day: 0.5 packs/day for 6.0 years (3.0 ttl pk-yrs)    Types: Cigarettes    Start date: 06/17/1973  Quit date: 06/18/1979    Years since quitting: 43.9   Smokeless tobacco: Never  Vaping Use   Vaping status: Never Used  Substance and Sexual Activity   Alcohol use: Yes    Comment: 1-2 beers Monthly.   Drug use: No   Sexual activity: Not on file  Other Topics Concern   Not on file  Social History Narrative   Not on file   Social Determinants of Health   Financial Resource Strain: Low Risk  (03/04/2023)   Overall Financial Resource Strain (CARDIA)    Difficulty of Paying Living Expenses: Not hard at all  Food Insecurity: Low Risk  (03/09/2023)   [facility]   Food Insecurity    Within the past 12 months, did the food you bought just not last and you didn't have money to get more?: No    Within the past 12 months, did you worry that your food would run out before you got money to buy more?: No  Transportation Needs: Low Risk  (03/09/2023)   Received from [facility]   Transportation Needs    Within the past 12 months, has a lack of transportation kept you from medical appointments or from doing things needed for daily living?: No  Physical Activity: Sufficiently Active (03/04/2023)   Exercise Vital Sign     Days of Exercise per Week: 6 days    Minutes of Exercise per Session: 30 min  Stress: No Stress Concern Present (03/04/2023)   [employer] of Occupational Health - Occupational Stress Questionnaire    Feeling of Stress : Only a little  Social Connections: Moderately Integrated (03/04/2023)   Social Connection and Isolation Panel [NHANES]    Frequency of Communication with Friends and Family: More than three times a week    Frequency of Social Gatherings with Friends and Family: Twice a week    Attends Religious Services: More than 4 times per year    Active Member of [employer] or Organizations: No    Attends Banker Meetings: Never    Marital Status: Married     Family History: [name] family history includes Alzheimer's disease in his father; Aneurysm in his mother; Cancer in his sister; Hypertension in his brother; Stroke in his brother. There is no history of Colon cancer, Esophageal cancer, Stomach cancer, Rectal cancer, or Colon polyps.  ROS:   Please see the history of present illness.     All other systems reviewed and are negative.  EKGs/Labs/Other Studies Reviewed:    The following studies were reviewed today:   EKG:   03/20/2023: V-paced, frequent PVCs  Recent Labs: 12/05/2022: ALT 16; TSH 2.06 04/28/2023: Platelets 144 05/01/2023: BUN 14; Creatinine, Ser 1.03 05/02/2023: Hemoglobin 14.3; Hemoglobin 14.6; Potassium 4.0; Potassium 4.0; Sodium 141; Sodium 141  Recent Lipid Panel    Component Value Date/Time   CHOL 129 12/05/2022 1008   TRIG 96 12/05/2022 1008   HDL 53 12/05/2022 1008   CHOLHDL 2.4 12/05/2022 1008   VLDL 22 11/06/2016 0910   LDLCALC 58 12/05/2022 1008    Physical Exam:    VS:  BP 116/70   Pulse 66   Ht 6\' 4"  (1.93 m)   Wt 186 lb (84.4 kg)   SpO2 98%   BMI 22.64 kg/m     Wt Readings from Last 3 Encounters:  05/06/23 186 lb (84.4 kg)  05/02/23 185 lb (83.9 kg)  04/28/23 187 lb 6.4 oz (85 kg)  GEN:  Well nourished,  well developed in no acute distress HEENT: Normal NECK: No JVD; No carotid bruits LYMPHATICS: No lymphadenopathy CARDIAC: RRR, no murmurs, rubs, gallops RESPIRATORY:  Clear to auscultation without rales, wheezing or rhonchi  ABDOMEN: Soft, non-tender, non-distended MUSCULOSKELETAL:  No edema; No deformity  SKIN: Warm and dry NEUROLOGIC:  Alert and oriented x 3 PSYCHIATRIC:  Normal affect   ASSESSMENT:    1. Chronic combined systolic and diastolic heart failure (HCC)   2. Status post coronary artery stent placement   3. Frequent PVCs   4. Hyperlipidemia with target LDL less than 100   5. Atrial flutter, unspecified type (HCC)   6. ASD (atrial septal defect)      PLAN:    Chronic combined systolic and diastolic heart failure: EF 45 to 50% on echo 10/2022.  Echocardiogram 04/04/2023 showed EF 25 to 30%, mild RV dysfunction, my left atrial lodgment, mild mitral regurgitation, small secundum ASD.  LHC/RHC 05/02/2023 showed 99% stenosis in mid LAD status post DES, no significant shunt, mildly elevated filling pressures (RA 11, RV 43/6, PA 41/25/30, PCWP 23, CI 2.9). -Continue Toprol-XL 50 mg daily -Continue losartan, did not start Entresto due to hyperkalemia -Recommend adding Jardiance 10 mg daily.  Repeat BMET in 1 week, can attempt to add spironolactone following this but will need to watch potassium.  Recommend scheduling in pharmacy heart failure clinic in 2 weeks for further titration of GDMT  CAD: LHC 05/02/2023 showed 50% proximal LAD, 99% mid LAD, 70% OM1 stenosis, status post DES to mid LAD. -Continue triple therapy with aspirin, Plavix, Eliquis x 1 month.  Then we will plan to stop aspirin and continue Plavix plus Eliquis -Continue atorvastatin -He is considering cardiac rehab  Atrial flutter: Diagnosed 10/2022.  Underwent DCCV at that time.  Has followed with EP, sees Dr. [name] -Continue Eliquis -Continue Toprol-XL 50 mg daily  Heart block: Status post PPM.  Follows with  EP  Frequent PVCs: 11% on recent device interrogation.  Increased Toprol-XL to 50 mg daily  ASD: Small secundum ASD with left to right shunting noted on echocardiogram 10/2022.  No significant shunting on cath as above.  Plan cardiac MRI for further evaluation  Hyperlipidemia: On atorvastatin 10 mg daily.  LDL 58 on 12/05/2022  RTC in 2 months  Medication Adjustments/Labs and Tests Ordered: Current medicines are reviewed at length with the patient today.  Concerns regarding medicines are outlined above.  Orders Placed This Encounter  Procedures   Basic Metabolic Panel (BMET)   AMB Referral to [facility]care Pharm-D   Meds ordered this encounter  Medications   empagliflozin (JARDIANCE) 10 MG TABS tablet    Sig: Take 1 tablet (10 mg total) by mouth daily before breakfast.    Dispense:  90 tablet    Refill:  3    Patient Instructions  Medication Instructions:  Start Jardariance 10mg  daily Starting on Dec 15 Stop aspirin and continue Eliquis 5 mg twice a day  as ordered and Plavix 75 mg daily   *If you need a refill on your cardiac medications before your next appointment, please call your pharmacy*   Lab Work: BMET in one week If you have labs (blood work) drawn today and your tests are completely normal, you will receive your results only by: MyChart Message (if you have MyChart) OR A paper copy in the mail If you have any lab test that is abnormal or we need to change your treatment, we will call you to review  the results.   Testing/Procedures: none   Follow-Up: At [facility], you and your health needs are our priority.  As part of our continuing mission to provide you with exceptional heart care, we have created designated Provider Care Teams.  These Care Teams include your primary Cardiologist (physician) and Advanced Practice Providers (APPs -  Physician Assistants and Nurse Practitioners) who all work together to provide you with the care you need, when you need  it.  We recommend signing up for the patient portal called "MyChart".  Sign up information is provided on this After Visit Summary.  MyChart is used to connect with patients for Virtual Visits (Telemedicine).  Patients are able to view lab/test results, encounter notes, upcoming appointments, etc.  Non-urgent messages can be sent to your provider as well.   To learn more about what you can do with MyChart, go to [email or web address].    Your next appointment:   Jan 14 @1 :40 pm  Provider:   Little Ishikawa, MD      Referral to Phar D     Signed, Little Ishikawa, MD  05/06/2023 5:16 PM    [facility]

## 2023-05-06 ENCOUNTER — Encounter: Payer: Self-pay | Admitting: Cardiology

## 2023-05-06 ENCOUNTER — Ambulatory Visit: Payer: Medicare Other | Attending: Cardiology | Admitting: Cardiology

## 2023-05-06 VITALS — BP 116/70 | HR 66 | Ht 76.0 in | Wt 186.0 lb

## 2023-05-06 DIAGNOSIS — I5042 Chronic combined systolic (congestive) and diastolic (congestive) heart failure: Secondary | ICD-10-CM | POA: Diagnosis not present

## 2023-05-06 DIAGNOSIS — Q211 Atrial septal defect, unspecified: Secondary | ICD-10-CM

## 2023-05-06 DIAGNOSIS — I493 Ventricular premature depolarization: Secondary | ICD-10-CM

## 2023-05-06 DIAGNOSIS — E785 Hyperlipidemia, unspecified: Secondary | ICD-10-CM

## 2023-05-06 DIAGNOSIS — I4892 Unspecified atrial flutter: Secondary | ICD-10-CM

## 2023-05-06 DIAGNOSIS — Z955 Presence of coronary angioplasty implant and graft: Secondary | ICD-10-CM

## 2023-05-06 MED ORDER — EMPAGLIFLOZIN 10 MG PO TABS: 10.0000 mg | ORAL_TABLET | Freq: Every day | ORAL | 3 refills | Status: DC

## 2023-05-06 MED FILL — Nitroglycerin IV Soln 100 MCG/ML in D5W: INTRA_ARTERIAL | Qty: 10 | Status: AC

## 2023-05-06 NOTE — Patient Instructions (Addendum)
Medication Instructions:  Start Jardariance 10mg  daily Starting on Dec 15 Stop aspirin and continue Eliquis 5 mg twice a day  as ordered and Plavix 75 mg daily   *If you need a refill on your cardiac medications before your next appointment, please call your pharmacy*   Lab Work: BMET in one week If you have labs (blood work) drawn today and your tests are completely normal, you will receive your results only by: MyChart Message (if you have MyChart) OR A paper copy in the mail If you have any lab test that is abnormal or we need to change your treatment, we will call you to review the results.   Testing/Procedures: none   Follow-Up: At [facility], you and your health needs are our priority.  As part of our continuing mission to provide you with exceptional heart care, we have created designated Provider Care Teams.  These Care Teams include your primary Cardiologist (physician) and Advanced Practice Providers (APPs -  Physician Assistants and Nurse Practitioners) who all work together to provide you with the care you need, when you need it.  We recommend signing up for the patient portal called "MyChart".  Sign up information is provided on this After Visit Summary.  MyChart is used to connect with patients for Virtual Visits (Telemedicine).  Patients are able to view lab/test results, encounter notes, upcoming appointments, etc.  Non-urgent messages can be sent to your provider as well.   To learn more about what you can do with MyChart, go to [email or web address].    Your next appointment:   Jan 6 @ 4:20 pm  Provider:   Little Ishikawa, MD      Referral to Phar D

## 2023-05-07 ENCOUNTER — Telehealth: Payer: Self-pay | Admitting: Cardiology

## 2023-05-07 MED ORDER — LOSARTAN POTASSIUM 25 MG PO TABS: 25.0000 mg | ORAL_TABLET | Freq: Every day | ORAL | 3 refills | Status: DC

## 2023-05-07 NOTE — Telephone Encounter (Signed)
*  STAT* If patient is at the pharmacy, call can be transferred to refill team.   1. Which medications need to be refilled? (please list name of each medication and dose if known) losartan (COZAAR) 25 MG tablet    2. Would you like to learn more about the convenience, safety, & potential cost savings by using the [facility]? No      3. Are you open to using the [facility] (Type [facility]. No ).   4. Which pharmacy/location (including street and city if local pharmacy) is medication to be sent to? [facility] - [place], [place] - [address]                                                     5. Do they need a 30 day or 90 day supply? 30    *Please send in 3 refills for medication

## 2023-05-07 NOTE — Telephone Encounter (Signed)
Refill faxed to the patient's pharmacy.

## 2023-05-08 ENCOUNTER — Telehealth (HOSPITAL_COMMUNITY): Payer: Self-pay

## 2023-05-08 NOTE — Telephone Encounter (Signed)
Called and spoke with pt in regards to CR, pt stated he is not interested at this time and may call back after the new year.   Closed referral

## 2023-05-20 DIAGNOSIS — J479 Bronchiectasis, uncomplicated: Secondary | ICD-10-CM | POA: Diagnosis not present

## 2023-05-20 DIAGNOSIS — J320 Chronic maxillary sinusitis: Secondary | ICD-10-CM | POA: Diagnosis not present

## 2023-05-20 DIAGNOSIS — E785 Hyperlipidemia, unspecified: Secondary | ICD-10-CM | POA: Diagnosis not present

## 2023-05-21 ENCOUNTER — Telehealth: Payer: Self-pay | Admitting: Cardiology

## 2023-05-21 DIAGNOSIS — E785 Hyperlipidemia, unspecified: Secondary | ICD-10-CM

## 2023-05-21 LAB — BASIC METABOLIC PANEL
BUN/Creatinine Ratio: 19 (ref 10–24)
BUN: 19 mg/dL (ref 8–27)
CO2: 23 mmol/L (ref 20–29)
Calcium: 9.5 mg/dL (ref 8.6–10.2)
Chloride: 106 mmol/L (ref 96–106)
Creatinine, Ser: 0.98 mg/dL (ref 0.76–1.27)
Glucose: 76 mg/dL (ref 70–99)
Potassium: 4.6 mmol/L (ref 3.5–5.2)
Sodium: 143 mmol/L (ref 134–144)
eGFR: 80 mL/min/{1.73_m2} (ref >59)

## 2023-05-21 MED ORDER — PANTOPRAZOLE SODIUM 40 MG PO TBEC: 40.0000 mg | DELAYED_RELEASE_TABLET | Freq: Every day | ORAL | 3 refills | Status: DC

## 2023-05-21 MED ORDER — CLOPIDOGREL BISULFATE 75 MG PO TABS: 75.0000 mg | ORAL_TABLET | Freq: Every day | ORAL | 0 refills | Status: AC

## 2023-05-21 MED ORDER — ATORVASTATIN CALCIUM 40 MG PO TABS: 40.0000 mg | ORAL_TABLET | Freq: Every day | ORAL | 3 refills | Status: DC

## 2023-05-21 NOTE — Telephone Encounter (Signed)
Pt has SO many questions about different medications. Please call pt to discuss

## 2023-05-21 NOTE — Telephone Encounter (Addendum)
Called and spoke to patient along with his wife. Informed them medications were sent to pharmacy per request. Also Protonix will be taken because he can't take Prilosec.

## 2023-05-21 NOTE — Telephone Encounter (Signed)
Called and spoke to patient and his wife about his medications he was started on while in the hospital. They are requesting prescriptions be sent to [facility]. They would like the follow medications:   Plavix 75 mg 90 day supply to be put on hold Protonix 40 mg 90 day supply if long term. Atorvastatin 40 mg 90 day supply   They would like to know how long patient will be on Protonix? If long term will send medication to pharmacy. Please advise>

## 2023-05-26 ENCOUNTER — Encounter (HOSPITAL_COMMUNITY): Payer: Self-pay

## 2023-05-26 ENCOUNTER — Other Ambulatory Visit (HOSPITAL_COMMUNITY): Payer: Medicare Other

## 2023-05-28 ENCOUNTER — Other Ambulatory Visit: Payer: Self-pay | Admitting: Cardiology

## 2023-05-28 ENCOUNTER — Ambulatory Visit (HOSPITAL_COMMUNITY)
Admission: RE | Admit: 2023-05-28 | Discharge: 2023-05-28 | Disposition: A | Discharge status: Home / Self Care | Payer: Medicare Other | Source: Ambulatory Visit | Attending: Cardiology | Admitting: Cardiology

## 2023-05-28 DIAGNOSIS — Q211 Atrial septal defect, unspecified: Secondary | ICD-10-CM | POA: Diagnosis not present

## 2023-05-28 MED ORDER — GADOBUTROL 1 MMOL/ML IV SOLN
10.0000 mL | Freq: Once | INTRAVENOUS | Status: AC | PRN
  Administered 2023-05-28: 10 mL via INTRAVENOUS

## 2023-05-28 NOTE — Telephone Encounter (Signed)
Patient is following up. He states he was told he would receive a call back regarding whether or not he would need to continue on Protonix.

## 2023-05-28 NOTE — Telephone Encounter (Signed)
Called patient with no answer. Unable to leave message voicemail is full.

## 2023-05-29 ENCOUNTER — Telehealth: Payer: Self-pay | Admitting: Cardiology

## 2023-05-29 NOTE — Telephone Encounter (Signed)
Returning call to a nurse about medications

## 2023-05-29 NOTE — Telephone Encounter (Signed)
Please see documentation in 12/12 TE

## 2023-05-29 NOTE — Telephone Encounter (Signed)
Patient identification verified by 2 forms. Marilynn Rail, RN    Called and spoke to patient wife Dolores Patty states:   -would like clarification on Protonix prescription  Informed Darl Pikes:   -Per Dr. Swaziland patient should be taking Protonix instead of Prilosec while on Plavix   -Protonix prescription was sent to [facility] on 12/4 Darl Pikes verbalized understanding, states will contact pharmacy  Darl Pikes has no further questions at this time

## 2023-05-30 DIAGNOSIS — N202 Calculus of kidney with calculus of ureter: Secondary | ICD-10-CM | POA: Diagnosis not present

## 2023-06-02 ENCOUNTER — Telehealth: Payer: Self-pay | Admitting: *Deleted

## 2023-06-02 NOTE — Telephone Encounter (Signed)
My chart results seen by patient on 06/02/23 @9am  and given to patient. Patient verbalizes an understanding and made aware of next scheduled appt. 06/23/2023 @4 :20  with provider. Patient verbalized an understanding.

## 2023-06-02 NOTE — Telephone Encounter (Signed)
Spoke to patient and my chart results seen and given to patient. Patient made aware of next scheduled appt 06/23/2023 @4 :20 pm. Patient voiced an understanding.

## 2023-06-16 ENCOUNTER — Telehealth: Payer: Self-pay

## 2023-06-16 ENCOUNTER — Telehealth: Payer: Self-pay | Admitting: *Deleted

## 2023-06-16 NOTE — Telephone Encounter (Signed)
Alert received from [employer] for AFL persistent per trends, controlled rates, burden 13%, Eliquis per PA report. Route for persistent arrhythmia.  Called pt to discuss any symptoms. No answer, LMTCB.

## 2023-06-16 NOTE — Telephone Encounter (Signed)
Called and spoke to patient with spouse present. Made patient aware of Dr. Bjorn Pippin result, heart pumping function remains severely reduced. Has known atrial septal defect but does not appear to be causing significant shunt. Patient voiced an understanding and has no other questions and verbalizes he looks forward to his next appt.

## 2023-06-17 NOTE — Telephone Encounter (Signed)
 Patient called advise AF noted on pacemaker. Patient is asymptomatic. Patient reports attempting to be compliant with Eliquis . Did have 2 recent missed doses with most recent 2 nights ago. Recommend AF clinic referral to discuss AF management. Pt agreeable to plan.

## 2023-06-17 NOTE — Progress Notes (Addendum)
 Office Visit    Patient Name: Carlos Choi Date of Encounter: 06/19/2023  Primary Care Provider:  Duanne Butler DASEN, MD Primary Cardiologist:  Lonni LITTIE Nanas, MD  Chief Complaint    Heart Failure Medication Titration - EF 25-30% (by echo 10/24)  Significant Past Medical History   CAD 11/24 - 99% mLAD stenosis - DES; on ASA, clopidogrel , Eliquis  x 1 month then d/c ASA  AFlutter Dx 5/24 CHADS2-VASc =5  Heart block S/P PPM - followed by EP  HLD 6/24 LDL 58 on atorvastatin  10  Frequent PVC's 11% on device interrogation, now metoprolol  xl 50    No Known Allergies  History of Present Illness    Carlos Choi is a 77 y.o. male patient of Dr Nanas, in the office today for heart failure medication titration.   He was first seen in cardiology earlier in 2024 after he was found to be in A Fib on the day of a scheduled sinus procedure.  He had a cardioversion in May then pacemaker implant in July before finally getting his sinus procedure completed in August.  Later had cystoscopy in October and heart cath in November.  States before 2024 was in good health on very few medications.    Today he is in the office with his wife, to discuss heart failure medications.   Currently he takes losartan , metoprolol  and empagliflozin .  Was originally given sample of Entresto , but wife notes he actually never started because of high potassium level.    Blood Pressure Goal:  130/80  GDMT: ACEI/ARB/ARNI [x] Yes [] No Losartan  25 every day, entresto  caused hyperkalemia  Beta blocker [x] Yes [] No Metoprolol  succ 50 qd  MRA [] Yes [x] No Watch potassium   SGLT2 inhibitor [x] Yes [] No Empagliflozin  10 qd    Previously tried:    Family Hx:   mother had several aneurysms (died with 3rd), brother died stroke at 24; sister died ovarian cancer, younger brother living with hypertension; 2 children healhy  Social Hx:      Tobacco: quit at least 45 years ago  Alcohol: only rarely   Caffeine: 2-3  cokes per week  Diet:  more home cooked meals, mix of vegetables (canned are rinsed), doing better at salt adding; , mix of proteins; some fruit, trying to improve  Exercise: walks 1.5 miles per day  Home BP readings: none with him, does have arm cuff at home      Accessory Clinical Findings    Lab Results  Component Value Date   CREATININE 0.98 05/20/2023   BUN 19 05/20/2023   NA 143 05/20/2023   K 4.6 05/20/2023   CL 106 05/20/2023   CO2 23 05/20/2023   Lab Results  Component Value Date   ALT 16 12/05/2022   AST 20 12/05/2022   ALKPHOS 63 11/06/2016   BILITOT 1.9 (H) 12/05/2022   Lab Results  Component Value Date   HGBA1C 5.7 (H) 12/03/2021    Home Medications/Allergies    Current Outpatient Medications  Medication Sig Dispense Refill   ALPRAZolam  (XANAX ) 0.25 MG tablet Take 1 tablet (0.25 mg total) by mouth daily as needed for anxiety. 90 tablet 0   apixaban  (ELIQUIS ) 5 MG TABS tablet Take 1 tablet (5 mg total) by mouth 2 (two) times daily. 60 tablet 5   atorvastatin  (LIPITOR) 40 MG tablet Take 1 tablet (40 mg total) by mouth daily. 90 tablet 3   clopidogrel  (PLAVIX ) 75 MG tablet Take 1 tablet (75 mg total) by mouth daily. 90  tablet 0   empagliflozin  (JARDIANCE ) 10 MG TABS tablet Take 1 tablet (10 mg total) by mouth daily before breakfast. 90 tablet 3   Guaifenesin (MUCINEX MAXIMUM STRENGTH) 1200 MG TB12 Take 1,200 mg by mouth 2 (two) times daily as needed (Congestion).     Lactobacillus-Inulin (CULTURELLE DIGESTIVE HEALTH PO) Take 1 capsule by mouth daily with lunch.     levothyroxine  (SYNTHROID ) 100 MCG tablet Take 1 tablet (100 mcg total) by mouth daily. 90 tablet 3   loratadine  (CLARITIN ) 10 MG tablet Take 10 mg by mouth daily with breakfast.     losartan  (COZAAR ) 25 MG tablet Take 1 tablet (25 mg total) by mouth daily. 90 tablet 3   metoprolol  succinate (TOPROL -XL) 50 MG 24 hr tablet Take 1 tablet (50 mg total) by mouth daily. Take with or immediately following a  meal. 30 tablet 6   Multiple Vitamins-Minerals (CENTRUM SILVER PO) Take 1 tablet by mouth daily with lunch.     nitroGLYCERIN  (NITROSTAT ) 0.4 MG SL tablet Place 1 tablet (0.4 mg total) under the tongue every 5 (five) minutes as needed for chest pain. 25 tablet 3   Omega-3 Fatty Acids (FISH OIL) 1000 MG CAPS Take 2,000 mg by mouth daily with lunch.     pantoprazole  (PROTONIX ) 40 MG tablet Take 1 tablet (40 mg total) by mouth daily. 90 tablet 3   silodosin (RAPAFLO) 8 MG CAPS capsule Take 8 mg by mouth daily with supper.     Sod Bicarb-K Bicarb-Citric Acd (ALKA-SELTZER GOLD) 1050-3804151895 MG TBEF Take 2 tablets by mouth daily as needed (upset stomach/indigestion.).     sodium chloride  (OCEAN) 0.65 % SOLN nasal spray Place 1 spray into both nostrils as needed for congestion.     spironolactone  (ALDACTONE ) 25 MG tablet Take 0.5 tablets (12.5 mg total) by mouth every other day. 15 tablet 3   ipratropium (ATROVENT) 0.03 % nasal spray Place 2 sprays into both nostrils daily as needed for rhinitis. (Patient not taking: Reported on 06/19/2023)     No current facility-administered medications for this visit.     No Known Allergies     Assessment & Plan      HFrEF (heart failure with reduced ejection fraction) (HCC) Assessment: BP in office today is 122/80 Patient with HFrEF   Tolerates current medications without any side effects Denies SOB, palpitation, chest pain, headaches,or edema No concerns with regards to cost of medications, compliance, ability to pick up at pharmacy Reiterated the importance of regular exercise and low salt diet  Plan: GDMT ACEI/ARB/ARNI Losartan  25 mg every day  Chart notes hyperkalemia w/Entresto , wife states they never took at home  Beta blocker Metoprolol  xl 50 mg every day    MRA none Start spironolactone  12.5 mg every other day   SGLT2 Empagliflozin  10 mg every day     Labs orderd: BMET in 1 week  Follow up with Dr. Kate on Monday    Carlos Choi  PharmD [name] [facility]  [address] [place], [place] [address] [phone]

## 2023-06-19 ENCOUNTER — Ambulatory Visit
Payer: Medicare Other | Attending: Internal Medicine | Admitting: Pharmacist Clinician (PhC)/ Clinical Pharmacy Specialist

## 2023-06-19 ENCOUNTER — Encounter: Payer: Self-pay | Admitting: Pharmacist Clinician (PhC)/ Clinical Pharmacy Specialist

## 2023-06-19 VITALS — BP 122/80 | HR 72

## 2023-06-19 DIAGNOSIS — I5042 Chronic combined systolic (congestive) and diastolic (congestive) heart failure: Secondary | ICD-10-CM

## 2023-06-19 DIAGNOSIS — I5020 Unspecified systolic (congestive) heart failure: Secondary | ICD-10-CM | POA: Insufficient documentation

## 2023-06-19 MED ORDER — SPIRONOLACTONE 25 MG PO TABS: 12.5000 mg | ORAL_TABLET | ORAL | 3 refills | Status: DC

## 2023-06-19 NOTE — Patient Instructions (Signed)
 Follow up appointment: With Dr. Kate on Monday  Go to the lab in 1 week to check potassium levels  Take your BP meds as follows:  Start spironolactone  12.5 mg every other day  Check your blood pressure at home daily (if able) and keep record of the readings.  Your blood pressure goal is < 130/80  To check your pressure at home you will need to:  1. Sit up in a chair, with feet flat on the floor and back supported. Do not cross your ankles or legs. 2. Rest your left arm so that the cuff is about heart level. If the cuff goes on your upper arm,  then just relax the arm on the table, arm of the chair or your lap. If you have a wrist cuff, we  suggest relaxing your wrist against your chest (think of it as Pledging the Flag with the  wrong arm).  3. Place the cuff snugly around your arm, about 1 inch above the crook of your elbow. The  cords should be inside the groove of your elbow.  4. Sit quietly, with the cuff in place, for about 5 minutes. After that 5 minutes press the power  button to start a reading. 5. Do not talk or move while the reading is taking place.  6. Record your readings on a sheet of paper. Although most cuffs have a memory, it is often  easier to see a pattern developing when the numbers are all in front of you.  7. You can repeat the reading after 1-3 minutes if it is recommended  Make sure your bladder is empty and you have not had caffeine or tobacco within the last 30 min  Always bring your blood pressure log with you to your appointments. If you have not brought your monitor in to be double checked for accuracy, please bring it to your next appointment.  You can find a list of quality blood pressure cuffs at [email or web address]  Important lifestyle changes to control high blood pressure  Intervention  Effect on the BP  Lose extra pounds and watch your waistline Weight loss is one of the most effective lifestyle changes for controlling blood pressure. If you're  overweight or obese, losing even a small amount of weight can help reduce blood pressure. Blood pressure might go down by about 1 millimeter of mercury (mm Hg) with each kilogram (about 2.2 pounds) of weight lost.  Exercise regularly As a general goal, aim for at least 30 minutes of moderate physical activity every day. Regular physical activity can lower high blood pressure by about 5 to 8 mm Hg.  Eat a healthy diet Eating a diet rich in whole grains, fruits, vegetables, and low-fat dairy products and low in saturated fat and cholesterol. A healthy diet can lower high blood pressure by up to 11 mm Hg.  Reduce salt (sodium) in your diet Even a small reduction of sodium in the diet can improve heart health and reduce high blood pressure by about 5 to 6 mm Hg.  Limit alcohol One drink equals 12 ounces of beer, 5 ounces of wine, or 1.5 ounces of 80-proof liquor.  Limiting alcohol to less than one drink a day for women or two drinks a day for men can help lower blood pressure by about 4 mm Hg.   If you have any questions or concerns please use My Chart to send questions or call the office at [phone]

## 2023-06-19 NOTE — Assessment & Plan Note (Signed)
 Assessment: BP in office today is 122/80 Patient with HFrEF   Tolerates current medications without any side effects Denies SOB, palpitation, chest pain, headaches,or edema No concerns with regards to cost of medications, compliance, ability to pick up at pharmacy Reiterated the importance of regular exercise and low salt diet  Plan: GDMT ACEI/ARB/ARNI Losartan  25 mg every day  Chart notes hyperkalemia w/Entresto , wife states they never took at home  Beta blocker Metoprolol  xl 50 mg every day    MRA none Start spironolactone  12.5 mg every other day   SGLT2 Empagliflozin  10 mg every day     Labs orderd: BMET in 1 week  Follow up with Dr. Kate on Monday

## 2023-06-22 NOTE — Progress Notes (Signed)
 Cardiology Office Note:    Date:  06/23/2023   ID:  Carlos Choi, DOB [birth year], MRN [id number]  PCP:  Duanne Butler DASEN, MD  Cardiologist:  Lonni LITTIE Nanas, MD  Electrophysiologist:  None   Referring MD: Duanne Butler DASEN, MD   Chief Complaint  Patient presents with   Congestive Heart Failure    History of Present Illness:    Carlos Choi is a 78 y.o. male with a hx of atrial flutter, ASD, heart block status post PPM, bronchiectasis, hypertension, tobacco use, hypothyroidism who is referred by Dr. Nancey for evaluation of heart failure.  He was initially diagnosed with atrial flutter 10/2022.  At that time he was started on Eliquis  and underwent DCCV.  Presented with complete heart block and underwent PPM 12/2022.  Echocardiogram 11/15/2022 showed EF 45 to 50%, normal RV function, moderate RV enlargement, moderate left atrial enlargement, severe right atrial enlargement, moderate MR, small secundum ASD with left-to-right shunting.  Echocardiogram 04/04/2023 showed EF 25 to 30%, mild RV dysfunction, mild mitral regurgitation, small secundum ASD.  LHC/RHC 05/02/2023 showed 99% stenosis in mid LAD status post DES, no significant shunt, mildly elevated filling pressures (RA 11, RV 43/6, PA 41/25/30, PCWP 23, CI 2.9).  Cardiac MRI 05/28/2023 showed ASD with Qp/Qs 1.2, LVEF 26%, septal dyskinesis consistent with RV pacing, RVEF 42%, subendocardial LGE in mid to apical anteroseptum consistent with prior infarct, mild mitral regurgitation (regurgitant fraction 18%).  Since last clinic visit, he reports he is doing well.  Denies any chest pain, dyspnea, lightheadedness, syncope, lower extremity edema, or palpitations.   Past Medical History:  Diagnosis Date   Allergy    Anxiety    Arthritis    mild ankles    ASD (atrial septal defect)    ASD (small secundum ASD with predominantly left-to-right shunting) 11/15/22 TTE   Atrial flutter (HCC)    s/p DCCV 11/04/22   Bronchiectasis (HCC)     Cataract    removed left eye, forming right eye    Diverticulosis    Dysrhythmia    2nd degree AV block, bradycardia s/p PPM 12/26/22; PVCs   GERD (gastroesophageal reflux disease)    Hemorrhoids    History of kidney stones    Hypertension    Hypothyroid    Presence of permanent cardiac pacemaker 12/26/2022   [employer]. [employer]/Abbott   Rectal bleeding    due to hemorrhoid    Tubular adenoma of colon 2015    Past Surgical History:  Procedure Laterality Date   CARDIOVERSION N/A 11/14/2022   Procedure: CARDIOVERSION;  Surgeon: Jeffrie Oneil BROCKS, MD;  Location: [facility] INVASIVE CV LAB;  Service: Cardiovascular;  Laterality: N/A;   CATARACT EXTRACTION Left    COLONOSCOPY     CORONARY STENT INTERVENTION N/A 05/02/2023   Procedure: CORONARY STENT INTERVENTION;  Surgeon: Jordan, Peter M, MD;  Location: [facility] INVASIVE CV LAB;  Service: Cardiovascular;  Laterality: N/A;   CYSTOSCOPY/URETEROSCOPY/HOLMIUM LASER/STENT PLACEMENT Left 03/18/2023   Procedure: CYSTOSCOPY LEFT URETEROSCOPY/STENT PLACEMENT;  Surgeon: Watt Rush, MD;  Location: [facility] ORS;  Service: Urology;  Laterality: Left;  1 HR FOR CASE   HERNIA REPAIR Bilateral    Over ten years ago per patient. He was not sure of the date.   NASAL SINUS SURGERY Left 02/05/2023   Procedure: ENDOSCOPIC MAXILLARY ANTROSTOMY WITH TISSUE REMOVAL;  Surgeon: Jesus Oliphant, MD;  Location: [facility] OR;  Service: ENT;  Laterality: Left;   PACEMAKER IMPLANT N/A 12/26/2022   Procedure: PACEMAKER IMPLANT;  Surgeon: Nancey,  Eulas BRAVO, MD;  Location: [facility] INVASIVE CV LAB;  Service: Cardiovascular;  Laterality: N/A;   POLYPECTOMY     RIGHT/LEFT HEART CATH AND CORONARY ANGIOGRAPHY N/A 05/02/2023   Procedure: RIGHT/LEFT HEART CATH AND CORONARY ANGIOGRAPHY;  Surgeon: Jordan, Peter M, MD;  Location: [facility] INVASIVE CV LAB;  Service: Cardiovascular;  Laterality: N/A;   ROTATOR CUFF REPAIR     both shoulders - patient does not remember the exact date    Current Medications: Current Meds   Medication Sig   ALPRAZolam  (XANAX ) 0.25 MG tablet Take 1 tablet (0.25 mg total) by mouth daily as needed for anxiety.   apixaban  (ELIQUIS ) 5 MG TABS tablet Take 1 tablet (5 mg total) by mouth 2 (two) times daily.   atorvastatin  (LIPITOR) 40 MG tablet Take 1 tablet (40 mg total) by mouth daily.   clopidogrel  (PLAVIX ) 75 MG tablet Take 1 tablet (75 mg total) by mouth daily.   empagliflozin  (JARDIANCE ) 10 MG TABS tablet Take 1 tablet (10 mg total) by mouth daily before breakfast.   Guaifenesin (MUCINEX MAXIMUM STRENGTH) 1200 MG TB12 Take 1,200 mg by mouth 2 (two) times daily as needed (Congestion).   ipratropium (ATROVENT) 0.03 % nasal spray Place 2 sprays into both nostrils daily as needed for rhinitis.   Lactobacillus-Inulin (CULTURELLE DIGESTIVE HEALTH PO) Take 1 capsule by mouth daily with lunch.   levothyroxine  (SYNTHROID ) 100 MCG tablet Take 1 tablet (100 mcg total) by mouth daily.   loratadine  (CLARITIN ) 10 MG tablet Take 10 mg by mouth daily with breakfast.   losartan  (COZAAR ) 25 MG tablet Take 1 tablet (25 mg total) by mouth daily.   metoprolol  succinate (TOPROL -XL) 50 MG 24 hr tablet Take 1 tablet (50 mg total) by mouth daily. Take with or immediately following a meal.   Multiple Vitamins-Minerals (CENTRUM SILVER PO) Take 1 tablet by mouth daily with lunch.   nitroGLYCERIN  (NITROSTAT ) 0.4 MG SL tablet Place 1 tablet (0.4 mg total) under the tongue every 5 (five) minutes as needed for chest pain.   Omega-3 Fatty Acids (FISH OIL) 1000 MG CAPS Take 2,000 mg by mouth daily with lunch.   pantoprazole  (PROTONIX ) 40 MG tablet Take 1 tablet (40 mg total) by mouth daily.   silodosin (RAPAFLO) 8 MG CAPS capsule Take 8 mg by mouth daily with supper.   Sod Bicarb-K Bicarb-Citric Acd (ALKA-SELTZER GOLD) [phone] MG TBEF Take 2 tablets by mouth daily as needed (upset stomach/indigestion.).   sodium chloride  (OCEAN) 0.65 % SOLN nasal spray Place 1 spray into both nostrils as needed for congestion.    spironolactone  (ALDACTONE ) 25 MG tablet Take 0.5 tablets (12.5 mg total) by mouth every other day.     Allergies:   Patient has no known allergies.   Social History   Socioeconomic History   Marital status: Married    Spouse name: Devere BROCKS.   Number of children: 2   Years of education: Not on file   Highest education level: Not on file  Occupational History   Not on file  Tobacco Use   Smoking status: Former    Current packs/day: 0.00    Average packs/day: 0.5 packs/day for 6.0 years (3.0 ttl pk-yrs)    Types: Cigarettes    Start date: 06/17/1973    Quit date: 06/18/1979    Years since quitting: 44.0   Smokeless tobacco: Never  Vaping Use   Vaping status: Never Used  Substance and Sexual Activity   Alcohol use: Yes    Comment: 1-2 beers  Monthly.   Drug use: No   Sexual activity: Not on file  Other Topics Concern   Not on file  Social History Narrative   Not on file   Social Drivers of Health   Financial Resource Strain: Low Risk  (03/04/2023)   Overall Financial Resource Strain (CARDIA)    Difficulty of Paying Living Expenses: Not hard at all  Food Insecurity: Low Risk  (03/09/2023)   [facility]   Food Insecurity    Within the past 12 months, did the food you bought just not last and you didn't have money to get more?: No    Within the past 12 months, did you worry that your food would run out before you got money to buy more?: No  Transportation Needs: Low Risk  (03/09/2023)   Received from [facility]   Transportation Needs    Within the past 12 months, has a lack of transportation kept you from medical appointments or from doing things needed for daily living?: No  Physical Activity: Sufficiently Active (03/04/2023)   Exercise Vital Sign    Days of Exercise per Week: 6 days    Minutes of Exercise per Session: 30 min  Stress: No Stress Concern Present (03/04/2023)   [employer] of Occupational Health - Occupational  Stress Questionnaire    Feeling of Stress : Only a little  Social Connections: Moderately Integrated (03/04/2023)   Social Connection and Isolation Panel [NHANES]    Frequency of Communication with Friends and Family: More than three times a week    Frequency of Social Gatherings with Friends and Family: Twice a week    Attends Religious Services: More than 4 times per year    Active Member of [employer] or Organizations: No    Attends Banker Meetings: Never    Marital Status: Married     Family History: [name] family history includes Alzheimer's disease in his father; Aneurysm in his mother; Cancer in his sister; Hypertension in his brother; Stroke in his brother. There is no history of Colon cancer, Esophageal cancer, Stomach cancer, Rectal cancer, or Colon polyps.  ROS:   Please see the history of present illness.     All other systems reviewed and are negative.  EKGs/Labs/Other Studies Reviewed:    The following studies were reviewed today:   EKG:   03/20/2023: V-paced, frequent PVCs 06/22/2022: V-paced, PVC, rate 76  Recent Labs: 12/05/2022: ALT 16; TSH 2.06 04/28/2023: Platelets 144 05/02/2023: Hemoglobin 14.3; Hemoglobin 14.6 05/20/2023: BUN 19; Creatinine, Ser 0.98; Potassium 4.6; Sodium 143  Recent Lipid Panel    Component Value Date/Time   CHOL 129 12/05/2022 1008   TRIG 96 12/05/2022 1008   HDL 53 12/05/2022 1008   CHOLHDL 2.4 12/05/2022 1008   VLDL 22 11/06/2016 0910   LDLCALC 58 12/05/2022 1008    Physical Exam:    VS:  BP 128/82 (BP Location: Right Arm, Patient Position: Sitting, Cuff Size: Normal)   Pulse 76   Ht 6' 4 (1.93 m)   Wt 183 lb 12.8 oz (83.4 kg)   SpO2 98%   BMI 22.37 kg/m     Wt Readings from Last 3 Encounters:  06/23/23 183 lb 12.8 oz (83.4 kg)  05/06/23 186 lb (84.4 kg)  05/02/23 185 lb (83.9 kg)     GEN:  Well nourished, well developed in no acute distress HEENT: Normal NECK: No JVD; No carotid bruits CARDIAC:  RRR, no murmurs, rubs, gallops  RESPIRATORY:  Clear to auscultation without rales, wheezing or rhonchi  ABDOMEN: Soft, non-tender, non-distended MUSCULOSKELETAL:  No edema; No deformity  SKIN: Warm and dry NEUROLOGIC:  Alert and oriented x 3 PSYCHIATRIC:  Normal affect   ASSESSMENT:    1. Chronic combined systolic and diastolic heart failure (HCC)   2. CAD in native artery   3. Essential hypertension   4. Hyperlipidemia with target LDL less than 100   5. Atrial flutter, unspecified type (HCC)      PLAN:    Chronic combined systolic and diastolic heart failure: EF 45 to 50% on echo 10/2022.  Echocardiogram 04/04/2023 showed EF 25 to 30%, mild RV dysfunction, my left atrial lodgment, mild mitral regurgitation, small secundum ASD.  LHC/RHC 05/02/2023 showed 99% stenosis in mid LAD status post DES, no significant shunt, mildly elevated filling pressures (RA 11, RV 43/6, PA 41/25/30, PCWP 23, CI 2.9).  Cardiac MRI 05/28/2023 showed ASD with Qp/Qs 1.2, LVEF 26%, septal dyskinesis consistent with RV pacing, RVEF 42%, subendocardial LGE in mid to apical anteroseptum consistent with prior infarct, mild mitral regurgitation (regurgitant fraction 18%). -Continue Toprol -XL 50 mg daily -Continue losartan  25 mg daily, did not start Entresto  due to hyperkalemia -Continue Jardiance  10 mg daily -Continue spironolactone  12.5 mg every other day.  Check BMET at end of this week, if stable potassium/creatinine would increase spironolactone  to daily -Recommend repeat echocardiogram in 3 months, if LVEF remains less than 35% would refer to EP for device upgrade to CRT-D (he is 99% RV paced)  CAD: LHC 05/02/2023 showed 50% proximal LAD, 99% mid LAD, 70% OM1 stenosis, status post DES to mid LAD. -Completed 1 month of triple therapy, now on Plavix  plus Eliquis  moving forward -Continue atorvastatin  -He is considering cardiac rehab  Atrial flutter: Diagnosed 10/2022.  Underwent DCCV at that time.  Has followed with  EP, sees Dr. Nancey. -Has recurrent atrial flutter, rate controlled, has follow-up in A-fib clinic -Continue Eliquis  -Continue Toprol -XL 50 mg daily  Heart block: Status post PPM.  Follows with EP  Frequent PVCs: 11% on recent device interrogation.  Increased Toprol -XL to 50 mg daily  ASD: Small secundum ASD with left to right shunting noted on echocardiogram 10/2022.  No significant shunting on cath as above.  Small shunt (Qp/Qs 1.2) on CMR 05/2023  Hyperlipidemia: On atorvastatin  40 mg daily.  Check lipid panel  RTC in 3 months  Medication Adjustments/Labs and Tests Ordered: Current medicines are reviewed at length with the patient today.  Concerns regarding medicines are outlined above.  Orders Placed This Encounter  Procedures   Lipid panel   EKG 12-Lead   ECHOCARDIOGRAM COMPLETE   No orders of the defined types were placed in this encounter.   Patient Instructions  Medication Instructions:  Continue current medications *If you need a refill on your cardiac medications before your next appointment, please call your pharmacy*   Lab Work: Lipid Panel  to be done with other lab work If you have labs (blood work) drawn today and your tests are completely normal, you will receive your results only by: MyChart Message (if you have MyChart) OR A paper copy in the mail If you have any lab test that is abnormal or we need to change your treatment, we will call you to review the results.   Testing/Procedures: ECHO  Your physician has requested that you have an echocardiogram. Echocardiography is a painless test that uses sound waves to create images of your heart. It provides your doctor with  information about the size and shape of your heart and how well your heart's chambers and valves are working. This procedure takes approximately one hour. There are no restrictions for this procedure. Please do NOT wear cologne, perfume, aftershave, or lotions (deodorant is allowed). Please  arrive 15 minutes prior to your appointment time.  Please note: We ask at that you not bring children with you during ultrasound (echo/ vascular) testing. Due to room size and safety concerns, children are not allowed in the ultrasound rooms during exams. Our front office staff cannot provide observation of children in our lobby area while testing is being conducted. An adult accompanying a patient to their appointment will only be allowed in the ultrasound room at the discretion of the ultrasound technician under special circumstances. We apologize for any inconvenience.    Follow-Up: At [facility], you and your health needs are our priority.  As part of our continuing mission to provide you with exceptional heart care, we have created designated Provider Care Teams.  These Care Teams include your primary Cardiologist (physician) and Advanced Practice Providers (APPs -  Physician Assistants and Nurse Practitioners) who all work together to provide you with the care you need, when you need it.  We recommend signing up for the patient portal called MyChart.  Sign up information is provided on this After Visit Summary.  MyChart is used to connect with patients for Virtual Visits (Telemedicine).  Patients are able to view lab/test results, encounter notes, upcoming appointments, etc.  Non-urgent messages can be sent to your provider as well.   To learn more about what you can do with MyChart, go to [email or web address].    Your next appointment:   Schedule after ECHO  Provider:   Lonni LITTIE Nanas, MD     Other Instructions None        Signed, Lonni LITTIE Nanas, MD  06/23/2023 5:25 PM    [facility]

## 2023-06-23 ENCOUNTER — Ambulatory Visit: Payer: Medicare Other | Attending: Cardiology | Admitting: Cardiology

## 2023-06-23 ENCOUNTER — Encounter: Payer: Self-pay | Admitting: Cardiology

## 2023-06-23 VITALS — BP 128/82 | HR 76 | Ht 76.0 in | Wt 183.8 lb

## 2023-06-23 DIAGNOSIS — I5042 Chronic combined systolic (congestive) and diastolic (congestive) heart failure: Secondary | ICD-10-CM

## 2023-06-23 DIAGNOSIS — E785 Hyperlipidemia, unspecified: Secondary | ICD-10-CM | POA: Diagnosis not present

## 2023-06-23 DIAGNOSIS — I4892 Unspecified atrial flutter: Secondary | ICD-10-CM

## 2023-06-23 DIAGNOSIS — I2510 Atherosclerotic heart disease of native coronary artery without angina pectoris: Secondary | ICD-10-CM

## 2023-06-23 DIAGNOSIS — I10 Essential (primary) hypertension: Secondary | ICD-10-CM | POA: Diagnosis not present

## 2023-06-23 NOTE — Patient Instructions (Addendum)
 Medication Instructions:  Continue current medications *If you need a refill on your cardiac medications before your next appointment, please call your pharmacy*   Lab Work: Lipid Panel  to be done with other lab work If you have labs (blood work) drawn today and your tests are completely normal, you will receive your results only by: MyChart Message (if you have MyChart) OR A paper copy in the mail If you have any lab test that is abnormal or we need to change your treatment, we will call you to review the results.   Testing/Procedures: ECHO schedule in 3 months  Your physician has requested that you have an echocardiogram. Echocardiography is a painless test that uses sound waves to create images of your heart. It provides your doctor with information about the size and shape of your heart and how well your heart's chambers and valves are working. This procedure takes approximately one hour. There are no restrictions for this procedure. Please do NOT wear cologne, perfume, aftershave, or lotions (deodorant is allowed). Please arrive 15 minutes prior to your appointment time.  Please note: We ask at that you not bring children with you during ultrasound (echo/ vascular) testing. Due to room size and safety concerns, children are not allowed in the ultrasound rooms during exams. Our front office staff cannot provide observation of children in our lobby area while testing is being conducted. An adult accompanying a patient to their appointment will only be allowed in the ultrasound room at the discretion of the ultrasound technician under special circumstances. We apologize for any inconvenience.    Follow-Up: At [facility], you and your health needs are our priority.  As part of our continuing mission to provide you with exceptional heart care, we have created designated Provider Care Teams.  These Care Teams include your primary Cardiologist (physician) and Advanced Practice  Providers (APPs -  Physician Assistants and Nurse Practitioners) who all work together to provide you with the care you need, when you need it.  We recommend signing up for the patient portal called MyChart.  Sign up information is provided on this After Visit Summary.  MyChart is used to connect with patients for Virtual Visits (Telemedicine).  Patients are able to view lab/test results, encounter notes, upcoming appointments, etc.  Non-urgent messages can be sent to your provider as well.   To learn more about what you can do with MyChart, go to [email or web address].    Your next appointment:   Schedule after ECHO  Provider:   Lonni LITTIE Nanas, MD     Other Instructions None

## 2023-06-25 DIAGNOSIS — N132 Hydronephrosis with renal and ureteral calculous obstruction: Secondary | ICD-10-CM | POA: Diagnosis not present

## 2023-06-25 DIAGNOSIS — N130 Hydronephrosis with ureteropelvic junction obstruction: Secondary | ICD-10-CM | POA: Diagnosis not present

## 2023-06-25 DIAGNOSIS — K5730 Diverticulosis of large intestine without perforation or abscess without bleeding: Secondary | ICD-10-CM | POA: Diagnosis not present

## 2023-06-25 DIAGNOSIS — N281 Cyst of kidney, acquired: Secondary | ICD-10-CM | POA: Diagnosis not present

## 2023-06-25 DIAGNOSIS — N202 Calculus of kidney with calculus of ureter: Secondary | ICD-10-CM | POA: Diagnosis not present

## 2023-06-25 DIAGNOSIS — N400 Enlarged prostate without lower urinary tract symptoms: Secondary | ICD-10-CM | POA: Diagnosis not present

## 2023-06-26 DIAGNOSIS — I5042 Chronic combined systolic (congestive) and diastolic (congestive) heart failure: Secondary | ICD-10-CM | POA: Diagnosis not present

## 2023-06-27 ENCOUNTER — Ambulatory Visit: Payer: Medicare Other

## 2023-06-27 ENCOUNTER — Telehealth: Payer: Self-pay | Admitting: Internal Medicine

## 2023-06-27 ENCOUNTER — Other Ambulatory Visit (HOSPITAL_COMMUNITY)
Admission: RE | Admit: 2023-06-27 | Discharge: 2023-06-27 | Disposition: A | Discharge status: Home / Self Care | Payer: Medicare Other | Source: Ambulatory Visit | Attending: Cardiology | Admitting: Cardiology

## 2023-06-27 ENCOUNTER — Telehealth: Payer: Self-pay | Admitting: *Deleted

## 2023-06-27 DIAGNOSIS — E875 Hyperkalemia: Secondary | ICD-10-CM | POA: Insufficient documentation

## 2023-06-27 DIAGNOSIS — I5042 Chronic combined systolic (congestive) and diastolic (congestive) heart failure: Secondary | ICD-10-CM | POA: Diagnosis not present

## 2023-06-27 DIAGNOSIS — I10 Essential (primary) hypertension: Secondary | ICD-10-CM

## 2023-06-27 LAB — BASIC METABOLIC PANEL
BUN/Creatinine Ratio: 17 (ref 10–24)
BUN: 16 mg/dL (ref 8–27)
CO2: 24 mmol/L (ref 20–29)
Calcium: 9.5 mg/dL (ref 8.6–10.2)
Chloride: 106 mmol/L (ref 96–106)
Creatinine, Ser: 0.93 mg/dL (ref 0.76–1.27)
Glucose: 77 mg/dL (ref 70–99)
Potassium: 5.4 mmol/L — ABNORMAL HIGH (ref 3.5–5.2)
Sodium: 144 mmol/L (ref 134–144)
eGFR: 86 mL/min/{1.73_m2} (ref >59)

## 2023-06-27 LAB — CUP PACEART REMOTE DEVICE CHECK
Battery Remaining Longevity: 96 mo
Battery Remaining Percentage: 95.5 %
Battery Voltage: 2.99 V
Brady Statistic AP VP Percent: 62 %
Brady Statistic AP VS Percent: 2.4 %
Brady Statistic AS VP Percent: 27 %
Brady Statistic AS VS Percent: 1.8 %
Brady Statistic RA Percent Paced: 44 %
Brady Statistic RV Percent Paced: 89 %
Date Time Interrogation Session: 20250110040015
Implantable Lead Connection Status: 753985
Implantable Lead Connection Status: 753985
Implantable Lead Implant Date: 20240711
Implantable Lead Implant Date: 20240711
Implantable Lead Location: 753859
Implantable Lead Location: 753860
Implantable Pulse Generator Implant Date: 20240711
Lead Channel Impedance Value: 450 Ohm
Lead Channel Impedance Value: 460 Ohm
Lead Channel Pacing Threshold Amplitude: 0.625 V
Lead Channel Pacing Threshold Amplitude: 0.75 V
Lead Channel Pacing Threshold Pulse Width: 0.5 ms
Lead Channel Pacing Threshold Pulse Width: 0.5 ms
Lead Channel Sensing Intrinsic Amplitude: 4.8 mV
Lead Channel Sensing Intrinsic Amplitude: 8.2 mV
Lead Channel Setting Pacing Amplitude: 2 V
Lead Channel Setting Pacing Amplitude: 2.5 V
Lead Channel Setting Pacing Pulse Width: 0.5 ms
Lead Channel Setting Sensing Sensitivity: 0.5 mV
Pulse Gen Model: 2272
Pulse Gen Serial Number: 5829995

## 2023-06-27 LAB — LIPID PANEL
Cholesterol: 133 mg/dL (ref 0–200)
HDL: 47 mg/dL (ref >40)
LDL Cholesterol: 67 mg/dL (ref 0–99)
Total CHOL/HDL Ratio: 2.8 {ratio}
Triglycerides: 96 mg/dL (ref <150)
VLDL: 19 mg/dL (ref 0–40)

## 2023-06-27 NOTE — Addendum Note (Signed)
 Addended by: Alta Corning on: 06/27/2023 08:44 AM   Modules accepted: Orders

## 2023-06-27 NOTE — Telephone Encounter (Signed)
 Needs CT set up in [facility] for upcoming appt.

## 2023-06-27 NOTE — Telephone Encounter (Signed)
 Called and made patient aware that Per Dr. Bjorn Pippin repeat Lab work  BMET to be done at [facility]. Patient verbalized an understanding.

## 2023-06-27 NOTE — Telephone Encounter (Signed)
 NFN

## 2023-07-01 ENCOUNTER — Ambulatory Visit (HOSPITAL_COMMUNITY)
Admission: RE | Admit: 2023-07-01 | Discharge: 2023-07-01 | Disposition: A | Discharge status: Home / Self Care | Payer: Medicare Other | Source: Ambulatory Visit | Attending: Internal Medicine | Admitting: Internal Medicine

## 2023-07-01 VITALS — BP 140/70 | HR 82 | Ht 76.0 in | Wt 181.6 lb

## 2023-07-01 DIAGNOSIS — Z79891 Long term (current) use of opiate analgesic: Secondary | ICD-10-CM | POA: Insufficient documentation

## 2023-07-01 DIAGNOSIS — D6869 Other thrombophilia: Secondary | ICD-10-CM | POA: Diagnosis not present

## 2023-07-01 DIAGNOSIS — I480 Paroxysmal atrial fibrillation: Secondary | ICD-10-CM

## 2023-07-01 DIAGNOSIS — I4891 Unspecified atrial fibrillation: Secondary | ICD-10-CM | POA: Diagnosis not present

## 2023-07-01 DIAGNOSIS — Z950 Presence of cardiac pacemaker: Secondary | ICD-10-CM | POA: Diagnosis not present

## 2023-07-01 DIAGNOSIS — I4892 Unspecified atrial flutter: Secondary | ICD-10-CM | POA: Diagnosis not present

## 2023-07-01 DIAGNOSIS — Z7901 Long term (current) use of anticoagulants: Secondary | ICD-10-CM | POA: Diagnosis not present

## 2023-07-01 DIAGNOSIS — I2510 Atherosclerotic heart disease of native coronary artery without angina pectoris: Secondary | ICD-10-CM | POA: Insufficient documentation

## 2023-07-01 DIAGNOSIS — I10 Essential (primary) hypertension: Secondary | ICD-10-CM | POA: Insufficient documentation

## 2023-07-01 DIAGNOSIS — Z8679 Personal history of other diseases of the circulatory system: Secondary | ICD-10-CM | POA: Insufficient documentation

## 2023-07-01 DIAGNOSIS — E039 Hypothyroidism, unspecified: Secondary | ICD-10-CM | POA: Diagnosis not present

## 2023-07-01 LAB — CBC
HCT: 49 % (ref 39.0–52.0)
Hemoglobin: 16.4 g/dL (ref 13.0–17.0)
MCH: 31.7 pg (ref 26.0–34.0)
MCHC: 33.5 g/dL (ref 30.0–36.0)
MCV: 94.6 fL (ref 80.0–100.0)
Platelets: 132 10*3/uL — ABNORMAL LOW (ref 150–400)
RBC: 5.18 MIL/uL (ref 4.22–5.81)
RDW: 14.1 % (ref 11.5–15.5)
WBC: 6.1 10*3/uL (ref 4.0–10.5)
nRBC: 0 % (ref 0.0–0.2)

## 2023-07-01 LAB — BASIC METABOLIC PANEL
Anion gap: 9 (ref 5–15)
BUN: 19 mg/dL (ref 8–23)
CO2: 24 mmol/L (ref 22–32)
Calcium: 9.5 mg/dL (ref 8.9–10.3)
Chloride: 109 mmol/L (ref 98–111)
Creatinine, Ser: 0.98 mg/dL (ref 0.61–1.24)
GFR, Estimated: >60 mL/min (ref >60)
Glucose, Bld: 98 mg/dL (ref 70–99)
Potassium: 4.7 mmol/L (ref 3.5–5.1)
Sodium: 142 mmol/L (ref 135–145)

## 2023-07-01 NOTE — H&P (View-Only) (Signed)
 Primary Care Physician: Duanne Butler DASEN, MD Primary Cardiologist: Dr. Kate  Primary Electrophysiologist: Dr. Nancey Referring Physician:    [name] is a 78 y.o. male with a history of bronchiectasis, history of tobacco use, chronic sinusitis, hypothyroidism, CAD, and atrial flutter who presents for consultation in the [facility]. The patient was initially diagnosed with atrial flutter on day of procedure for maxillary antrostomy. He was found to be in atrial flutter from review of records on day of procedure with variable HR 35-70 bpm so procedure was canceled. He does not have cardiac awareness of arrhythmia. Patient was on aspirin  81 mg every other day. He is currently on Eliquis  5 mg BID for a CHADS2VASC score of 4.  On evaluation today, he is in atrial flutter. He cannot feel when he is in arrhythmia and was surprised when he was told on day of surgical procedure. It was canceled. He feels tired but states this symptom has been going on for months, since last year.   On follow up 11/20/22, he is in NSR with 1st degree AV block with PVCs in a pattern of bigeminy (versus Wenckebach). He underwent successful cardioversion on 11/14/22 however bradycardia with AV block noted post-conversion so patient was admitted for observation. As anesthesia wore off, patient had various degrees of conduction abnormality including first degree AVB, then second degree type I AV block, then 2-1 AV nodal conduction with junctional escape. He was not presyncopal. TTE showed newly noted decreased LVEF and placed on losartan  25 mg daily. Scheduled for cardiac PET stress to evaluate coronary anatomy. He is scheduled to see Dr. Nancey on 6/21 for PPM evaluation. He has been compliant with Eliquis  5 mg BID. No episodes of syncope. He monitors his rhythm with Kardiamobile device.   On follow up 07/01/23, he is currently in atrial flutter. Device review on 06/27/23 showed persistent  atrial flutter with controlled ventricular rates and 24% burden. He missed a dose of Eliquis  the week before 1/6.   Today, he denies symptoms of palpitations, chest pain, shortness of breath, orthopnea, PND, lower extremity edema, dizziness, presyncope, syncope, snoring, daytime somnolence, bleeding, or neurologic sequela. The patient is tolerating medications without difficulties and is otherwise without complaint today.   Atrial Fibrillation Risk Factors:  he does not have symptoms or diagnosis of sleep apnea. he does not have a history of rheumatic fever. he does not have a history of alcohol use. The patient does not have a history of early familial atrial fibrillation or other arrhythmias.  he has a BMI of Body mass index is 22.11 kg/m.SABRA Filed Weights   07/01/23 1521  Weight: 82.4 kg     Family History  Problem Relation Age of Onset   Aneurysm Mother    Alzheimer's disease Father    Cancer Sister        ovarian   Stroke Brother    Hypertension Brother    Colon cancer Neg Hx    Esophageal cancer Neg Hx    Stomach cancer Neg Hx    Rectal cancer Neg Hx    Colon polyps Neg Hx     Atrial Fibrillation Management history:  Previous antiarrhythmic drugs: None Previous cardioversions: 11/14/22 Previous ablations: None Anticoagulation history: Eliquis  5 mg BID   Past Medical History:  Diagnosis Date   Allergy    Anxiety    Arthritis    mild ankles    ASD (atrial septal defect)    ASD (small secundum  ASD with predominantly left-to-right shunting) 11/15/22 TTE   Atrial flutter (HCC)    s/p DCCV 11/04/22   Bronchiectasis (HCC)    Cataract    removed left eye, forming right eye    Diverticulosis    Dysrhythmia    2nd degree AV block, bradycardia s/p PPM 12/26/22; PVCs   GERD (gastroesophageal reflux disease)    Hemorrhoids    History of kidney stones    Hypertension    Hypothyroid    Presence of permanent cardiac pacemaker 12/26/2022   St. Jude/Abbott   Rectal  bleeding    due to hemorrhoid    Tubular adenoma of colon 2015   Past Surgical History:  Procedure Laterality Date   CARDIOVERSION N/A 11/14/2022   Procedure: CARDIOVERSION;  Surgeon: Jake Bathe, MD;  Location: [facility] INVASIVE CV LAB;  Service: Cardiovascular;  Laterality: N/A;   CATARACT EXTRACTION Left    COLONOSCOPY     CORONARY STENT INTERVENTION N/A 05/02/2023   Procedure: CORONARY STENT INTERVENTION;  Surgeon: Swaziland, Peter M, MD;  Location: [facility] INVASIVE CV LAB;  Service: Cardiovascular;  Laterality: N/A;   CYSTOSCOPY/URETEROSCOPY/HOLMIUM LASER/STENT PLACEMENT Left 03/18/2023   Procedure: CYSTOSCOPY LEFT URETEROSCOPY/STENT PLACEMENT;  Surgeon: Bjorn Pippin, MD;  Location: [facility];  Service: Urology;  Laterality: Left;  1 HR FOR CASE   HERNIA REPAIR Bilateral    Over ten years ago per patient. He was not sure of the date.   NASAL SINUS SURGERY Left 02/05/2023   Procedure: ENDOSCOPIC MAXILLARY ANTROSTOMY WITH TISSUE REMOVAL;  Surgeon: Serena Colonel, MD;  Location: [facility] OR;  Service: ENT;  Laterality: Left;   PACEMAKER IMPLANT N/A 12/26/2022   Procedure: PACEMAKER IMPLANT;  Surgeon: Maurice Small, MD;  Location: [facility]VASIVE CV LAB;  Service: Cardiovascular;  Laterality: N/A;   POLYPECTOMY     RIGHT/LEFT HEART CATH AND CORONARY ANGIOGRAPHY N/A 05/02/2023   Procedure: RIGHT/LEFT HEART CATH AND CORONARY ANGIOGRAPHY;  Surgeon: Swaziland, Peter M, MD;  Location: [facility] INVASIVE CV LAB;  Service: Cardiovascular;  Laterality: N/A;   ROTATOR CUFF REPAIR     both shoulders - patient does not remember the exact date    Current Outpatient Medications  Medication Sig Dispense Refill   ALPRAZolam (XANAX) 0.25 MG tablet Take 1 tablet (0.25 mg total) by mouth daily as needed for anxiety. 90 tablet 0   apixaban (ELIQUIS) 5 MG TABS tablet Take 1 tablet (5 mg total) by mouth 2 (two) times daily. 60 tablet 5   atorvastatin (LIPITOR) 40 MG tablet Take 1 tablet (40 mg total) by mouth daily. 90 tablet 3    clopidogrel (PLAVIX) 75 MG tablet Take 1 tablet (75 mg total) by mouth daily. 90 tablet 0   empagliflozin (JARDIANCE) 10 MG TABS tablet Take 1 tablet (10 mg total) by mouth daily before breakfast. 90 tablet 3   Guaifenesin (MUCINEX MAXIMUM STRENGTH) 1200 MG TB12 Take 1,200 mg by mouth 2 (two) times daily as needed (Congestion).     ipratropium (ATROVENT) 0.03 % nasal spray Place 2 sprays into both nostrils daily as needed for rhinitis.     Lactobacillus-Inulin (CULTURELLE DIGESTIVE HEALTH PO) Take 1 capsule by mouth daily with lunch.     levothyroxine (SYNTHROID) 100 MCG tablet Take 1 tablet (100 mcg total) by mouth daily. 90 tablet 3   loratadine (CLARITIN) 10 MG tablet Take 10 mg by mouth daily with breakfast.     losartan (COZAAR) 25 MG tablet Take 1 tablet (25 mg total) by mouth daily. 90 tablet 3  metoprolol  succinate (TOPROL -XL) 50 MG 24 hr tablet Take 1 tablet (50 mg total) by mouth daily. Take with or immediately following a meal. 30 tablet 6   Multiple Vitamins-Minerals (CENTRUM SILVER PO) Take 1 tablet by mouth daily with lunch.     nitroGLYCERIN  (NITROSTAT ) 0.4 MG SL tablet Place 1 tablet (0.4 mg total) under the tongue every 5 (five) minutes as needed for chest pain. 25 tablet 3   Omega-3 Fatty Acids (FISH OIL) 1000 MG CAPS Take 2,000 mg by mouth daily with lunch.     pantoprazole  (PROTONIX ) 40 MG tablet Take 1 tablet (40 mg total) by mouth daily. 90 tablet 3   silodosin (RAPAFLO) 8 MG CAPS capsule Take 8 mg by mouth daily with supper.     Sod Bicarb-K Bicarb-Citric Acd (ALKA-SELTZER GOLD) [phone] MG TBEF Take 2 tablets by mouth daily as needed (upset stomach/indigestion.).     sodium chloride  (OCEAN) 0.65 % SOLN nasal spray Place 1 spray into both nostrils as needed for congestion.     spironolactone  (ALDACTONE ) 25 MG tablet Take 0.5 tablets (12.5 mg total) by mouth every other day. 15 tablet 3   No current facility-administered medications for this encounter.    No Known  Allergies  ROS- All systems are reviewed and negative except as per the HPI above.  Physical Exam: Vitals:   07/01/23 1521  BP: (!) 140/70  Pulse: 82  Weight: 82.4 kg  Height: 6' 4 (1.93 m)    GEN- The patient is well appearing, alert and oriented x 3 today.   Neck - no JVD or carotid bruit noted Lungs- Clear to ausculation bilaterally, normal work of breathing Heart- Regular rate (V paced) with ectopy noted, no murmurs, rubs or gallops, PMI not laterally displaced Extremities- no clubbing, cyanosis, or edema Skin - no rash or ecchymosis noted   Wt Readings from Last 3 Encounters:  07/01/23 82.4 kg  06/23/23 83.4 kg  05/06/23 84.4 kg    EKG today demonstrates  Vent. rate 82 BPM PR interval * ms QRS duration 166 ms QT/QTcB 450/525 ms P-R-T axes * -77 90 Ventricular-paced rhythm with frequent Premature ventricular complexes Abnormal ECG When compared with ECG of 23-Jun-2023 16:34, PREVIOUS ECG IS PRESENT  Echo 04/04/23: 1. Left ventricular ejection fraction, by estimation, is 25 to 30%. Left  ventricular ejection fraction by 3D volume is 26 %. The left ventricle has  severely decreased function. The left ventricle demonstrates global  hypokinesis. The left ventricular  internal cavity size was mildly dilated. There is mild eccentric left  ventricular hypertrophy. Left ventricular diastolic parameters are  indeterminate.   2. Right ventricular systolic function is mildly reduced. The right  ventricular size is mildly enlarged. There is normal pulmonary artery  systolic pressure.   3. Left atrial size was mildly dilated.   4. The mitral valve is normal in structure. Mild mitral valve  regurgitation.   5. The aortic valve is tricuspid. Aortic valve regurgitation is not  visualized.   6. Evidence of atrial level shunting detected by color flow Doppler.  There is a small secundum atrial septal defect with predominantly left to  right shunting across the atrial  septum.   Comparison(s): Changes from prior study are noted. LV function is  significantly worse.    Epic records are reviewed at length today.  CHA2DS2-VASc Score = 3  [name] score is based upon: CHF History: 1 HTN History: 0 Diabetes History: 0 Stroke History: 0 Vascular Disease History: 0 Age  Score: 2 Gender Score: 0       ASSESSMENT AND PLAN: Paroxysmal Atrial Flutter (ICD10:  I48.0) [name] CHA2DS2-VASc score is 3, indicating a 3.2% annual risk of stroke.   S/p DCCV on 11/14/22 with post conversion AV block. S/p PPM placement on 12/26/22 by Dr. [name].   He is in atrial flutter. We discussed the necessity of cardioversion to help convert him to NSR, especially in light of his decreased LV function. Patient agrees to proceed with cardioversion. Labs drawn today.   Informed Consent   Shared Decision Making/Informed Consent The risks (stroke, cardiac arrhythmias rarely resulting in the need for a temporary or permanent pacemaker, skin irritation or burns and complications associated with conscious sedation including aspiration, arrhythmia, respiratory failure and death), benefits (restoration of normal sinus rhythm) and alternatives of a direct current cardioversion were explained in detail to Mr. Leisinger and he agrees to proceed.       2. Secondary hypercoagulable state due to atrial flutter Continue Eliquis 5 mg BID without interruption.   3. S/p PPM placement on 12/26/22 Followed by Dr. [name] / device clinic.     Follow up 2 weeks after DCCV.   [name], PA-C [facility] [address] [place], [place] [address] [phone] 07/01/2023 4:05 PM

## 2023-07-01 NOTE — Progress Notes (Signed)
 Primary Care Physician: Duanne Butler DASEN, MD Primary Cardiologist: Dr. Kate  Primary Electrophysiologist: Dr. Nancey Referring Physician:    [name] is a 78 y.o. male with a history of bronchiectasis, history of tobacco use, chronic sinusitis, hypothyroidism, CAD, and atrial flutter who presents for consultation in the [facility]. The patient was initially diagnosed with atrial flutter on day of procedure for maxillary antrostomy. He was found to be in atrial flutter from review of records on day of procedure with variable HR 35-70 bpm so procedure was canceled. He does not have cardiac awareness of arrhythmia. Patient was on aspirin  81 mg every other day. He is currently on Eliquis  5 mg BID for a CHADS2VASC score of 4.  On evaluation today, he is in atrial flutter. He cannot feel when he is in arrhythmia and was surprised when he was told on day of surgical procedure. It was canceled. He feels tired but states this symptom has been going on for months, since last year.   On follow up 11/20/22, he is in NSR with 1st degree AV block with PVCs in a pattern of bigeminy (versus Wenckebach). He underwent successful cardioversion on 11/14/22 however bradycardia with AV block noted post-conversion so patient was admitted for observation. As anesthesia wore off, patient had various degrees of conduction abnormality including first degree AVB, then second degree type I AV block, then 2-1 AV nodal conduction with junctional escape. He was not presyncopal. TTE showed newly noted decreased LVEF and placed on losartan  25 mg daily. Scheduled for cardiac PET stress to evaluate coronary anatomy. He is scheduled to see Dr. Nancey on 6/21 for PPM evaluation. He has been compliant with Eliquis  5 mg BID. No episodes of syncope. He monitors his rhythm with Kardiamobile device.   On follow up 07/01/23, he is currently in atrial flutter. Device review on 06/27/23 showed persistent  atrial flutter with controlled ventricular rates and 24% burden. He missed a dose of Eliquis  the week before 1/6.   Today, he denies symptoms of palpitations, chest pain, shortness of breath, orthopnea, PND, lower extremity edema, dizziness, presyncope, syncope, snoring, daytime somnolence, bleeding, or neurologic sequela. The patient is tolerating medications without difficulties and is otherwise without complaint today.   Atrial Fibrillation Risk Factors:  he does not have symptoms or diagnosis of sleep apnea. he does not have a history of rheumatic fever. he does not have a history of alcohol use. The patient does not have a history of early familial atrial fibrillation or other arrhythmias.  he has a BMI of Body mass index is 22.11 kg/m.SABRA Filed Weights   07/01/23 1521  Weight: 82.4 kg     Family History  Problem Relation Age of Onset   Aneurysm Mother    Alzheimer's disease Father    Cancer Sister        ovarian   Stroke Brother    Hypertension Brother    Colon cancer Neg Hx    Esophageal cancer Neg Hx    Stomach cancer Neg Hx    Rectal cancer Neg Hx    Colon polyps Neg Hx     Atrial Fibrillation Management history:  Previous antiarrhythmic drugs: None Previous cardioversions: 11/14/22 Previous ablations: None Anticoagulation history: Eliquis  5 mg BID   Past Medical History:  Diagnosis Date   Allergy    Anxiety    Arthritis    mild ankles    ASD (atrial septal defect)    ASD (small secundum  ASD with predominantly left-to-right shunting) 11/15/22 TTE   Atrial flutter (HCC)    s/p DCCV 11/04/22   Bronchiectasis (HCC)    Cataract    removed left eye, forming right eye    Diverticulosis    Dysrhythmia    2nd degree AV block, bradycardia s/p PPM 12/26/22; PVCs   GERD (gastroesophageal reflux disease)    Hemorrhoids    History of kidney stones    Hypertension    Hypothyroid    Presence of permanent cardiac pacemaker 12/26/2022   St. Jude/Abbott   Rectal  bleeding    due to hemorrhoid    Tubular adenoma of colon 2015   Past Surgical History:  Procedure Laterality Date   CARDIOVERSION N/A 11/14/2022   Procedure: CARDIOVERSION;  Surgeon: Jeffrie Oneil BROCKS, MD;  Location: [facility] INVASIVE CV LAB;  Service: Cardiovascular;  Laterality: N/A;   CATARACT EXTRACTION Left    COLONOSCOPY     CORONARY STENT INTERVENTION N/A 05/02/2023   Procedure: CORONARY STENT INTERVENTION;  Surgeon: Jordan, Peter M, MD;  Location: [facility] INVASIVE CV LAB;  Service: Cardiovascular;  Laterality: N/A;   CYSTOSCOPY/URETEROSCOPY/HOLMIUM LASER/STENT PLACEMENT Left 03/18/2023   Procedure: CYSTOSCOPY LEFT URETEROSCOPY/STENT PLACEMENT;  Surgeon: Watt Rush, MD;  Location: [facility];  Service: Urology;  Laterality: Left;  1 HR FOR CASE   HERNIA REPAIR Bilateral    Over ten years ago per patient. He was not sure of the date.   NASAL SINUS SURGERY Left 02/05/2023   Procedure: ENDOSCOPIC MAXILLARY ANTROSTOMY WITH TISSUE REMOVAL;  Surgeon: Jesus Oliphant, MD;  Location: [facility] OR;  Service: ENT;  Laterality: Left;   PACEMAKER IMPLANT N/A 12/26/2022   Procedure: PACEMAKER IMPLANT;  Surgeon: Nancey Eulas BRAVO, MD;  Location: [facility]VAS[facility] CV LAB;  Service: Cardiovascular;  Laterality: N/A;   POLYPECTOMY     RIGHT/LEFT HEART CATH AND CORONARY ANGIOGRAPHY N/A 05/02/2023   Procedure: RIGHT/LEFT HEART CATH AND CORONARY ANGIOGRAPHY;  Surgeon: Jordan, Peter M, MD;  Location: [facility] INVASIVE CV LAB;  Service: Cardiovascular;  Laterality: N/A;   ROTATOR CUFF REPAIR     both shoulders - patient does not remember the exact date    Current Outpatient Medications  Medication Sig Dispense Refill   ALPRAZolam  (XANAX ) 0.25 MG tablet Take 1 tablet (0.25 mg total) by mouth daily as needed for anxiety. 90 tablet 0   apixaban  (ELIQUIS ) 5 MG TABS tablet Take 1 tablet (5 mg total) by mouth 2 (two) times daily. 60 tablet 5   atorvastatin  (LIPITOR) 40 MG tablet Take 1 tablet (40 mg total) by mouth daily. 90 tablet 3    clopidogrel  (PLAVIX ) 75 MG tablet Take 1 tablet (75 mg total) by mouth daily. 90 tablet 0   empagliflozin  (JARDIANCE ) 10 MG TABS tablet Take 1 tablet (10 mg total) by mouth daily before breakfast. 90 tablet 3   Guaifenesin (MUCINEX MAXIMUM STRENGTH) 1200 MG TB12 Take 1,200 mg by mouth 2 (two) times daily as needed (Congestion).     ipratropium (ATROVENT) 0.03 % nasal spray Place 2 sprays into both nostrils daily as needed for rhinitis.     Lactobacillus-Inulin (CULTURELLE DIGESTIVE HEALTH PO) Take 1 capsule by mouth daily with lunch.     levothyroxine  (SYNTHROID ) 100 MCG tablet Take 1 tablet (100 mcg total) by mouth daily. 90 tablet 3   loratadine  (CLARITIN ) 10 MG tablet Take 10 mg by mouth daily with breakfast.     losartan  (COZAAR ) 25 MG tablet Take 1 tablet (25 mg total) by mouth daily. 90 tablet 3  metoprolol  succinate (TOPROL -XL) 50 MG 24 hr tablet Take 1 tablet (50 mg total) by mouth daily. Take with or immediately following a meal. 30 tablet 6   Multiple Vitamins-Minerals (CENTRUM SILVER PO) Take 1 tablet by mouth daily with lunch.     nitroGLYCERIN  (NITROSTAT ) 0.4 MG SL tablet Place 1 tablet (0.4 mg total) under the tongue every 5 (five) minutes as needed for chest pain. 25 tablet 3   Omega-3 Fatty Acids (FISH OIL) 1000 MG CAPS Take 2,000 mg by mouth daily with lunch.     pantoprazole  (PROTONIX ) 40 MG tablet Take 1 tablet (40 mg total) by mouth daily. 90 tablet 3   silodosin (RAPAFLO) 8 MG CAPS capsule Take 8 mg by mouth daily with supper.     Sod Bicarb-K Bicarb-Citric Acd (ALKA-SELTZER GOLD) [phone] MG TBEF Take 2 tablets by mouth daily as needed (upset stomach/indigestion.).     sodium chloride  (OCEAN) 0.65 % SOLN nasal spray Place 1 spray into both nostrils as needed for congestion.     spironolactone  (ALDACTONE ) 25 MG tablet Take 0.5 tablets (12.5 mg total) by mouth every other day. 15 tablet 3   No current facility-administered medications for this encounter.    No Known  Allergies  ROS- All systems are reviewed and negative except as per the HPI above.  Physical Exam: Vitals:   07/01/23 1521  BP: (!) 140/70  Pulse: 82  Weight: 82.4 kg  Height: 6' 4 (1.93 m)    GEN- The patient is well appearing, alert and oriented x 3 today.   Neck - no JVD or carotid bruit noted Lungs- Clear to ausculation bilaterally, normal work of breathing Heart- Regular rate (V paced) with ectopy noted, no murmurs, rubs or gallops, PMI not laterally displaced Extremities- no clubbing, cyanosis, or edema Skin - no rash or ecchymosis noted   Wt Readings from Last 3 Encounters:  07/01/23 82.4 kg  06/23/23 83.4 kg  05/06/23 84.4 kg    EKG today demonstrates  Vent. rate 82 BPM PR interval * ms QRS duration 166 ms QT/QTcB 450/525 ms P-R-T axes * -77 90 Ventricular-paced rhythm with frequent Premature ventricular complexes Abnormal ECG When compared with ECG of 23-Jun-2023 16:34, PREVIOUS ECG IS PRESENT  Echo 04/04/23: 1. Left ventricular ejection fraction, by estimation, is 25 to 30%. Left  ventricular ejection fraction by 3D volume is 26 %. The left ventricle has  severely decreased function. The left ventricle demonstrates global  hypokinesis. The left ventricular  internal cavity size was mildly dilated. There is mild eccentric left  ventricular hypertrophy. Left ventricular diastolic parameters are  indeterminate.   2. Right ventricular systolic function is mildly reduced. The right  ventricular size is mildly enlarged. There is normal pulmonary artery  systolic pressure.   3. Left atrial size was mildly dilated.   4. The mitral valve is normal in structure. Mild mitral valve  regurgitation.   5. The aortic valve is tricuspid. Aortic valve regurgitation is not  visualized.   6. Evidence of atrial level shunting detected by color flow Doppler.  There is a small secundum atrial septal defect with predominantly left to  right shunting across the atrial  septum.   Comparison(s): Changes from prior study are noted. LV function is  significantly worse.    Epic records are reviewed at length today.  CHA2DS2-VASc Score = 3  [name] score is based upon: CHF History: 1 HTN History: 0 Diabetes History: 0 Stroke History: 0 Vascular Disease History: 0 Age  Score: 2 Gender Score: 0       ASSESSMENT AND PLAN: Paroxysmal Atrial Flutter (ICD10:  I48.0) [name] CHA2DS2-VASc score is 3, indicating a 3.2% annual risk of stroke.   S/p DCCV on 11/14/22 with post conversion AV block. S/p PPM placement on 12/26/22 by Dr. Nancey.   He is in atrial flutter. We discussed the necessity of cardioversion to help convert him to NSR, especially in light of his decreased LV function. Patient agrees to proceed with cardioversion. Labs drawn today.   Informed Consent   Shared Decision Making/Informed Consent The risks (stroke, cardiac arrhythmias rarely resulting in the need for a temporary or permanent pacemaker, skin irritation or burns and complications associated with conscious sedation including aspiration, arrhythmia, respiratory failure and death), benefits (restoration of normal sinus rhythm) and alternatives of a direct current cardioversion were explained in detail to Carlos Choi and he agrees to proceed.       2. Secondary hypercoagulable state due to atrial flutter Continue Eliquis  5 mg BID without interruption.   3. S/p PPM placement on 12/26/22 Followed by Dr. Nancey / device clinic.     Follow up 2 weeks after DCCV.   Fairy Heinrich, PA-C [facility] [address] [place], [place] [address] [phone] 07/01/2023 4:05 PM

## 2023-07-01 NOTE — Patient Instructions (Signed)
 Cardioversion scheduled for: Tuesday, January 28th   - Arrive at the [facility] and go to admitting at   - Do not eat or drink anything after midnight the night prior to your procedure.   - Take all your morning medication (except diabetic medications) with a sip of water prior to arrival.  - You will not be able to drive home after your procedure.    - Do NOT miss any doses of your blood thinner - if you should miss a dose please notify our office immediately.   - If you feel as if you go back into normal rhythm prior to scheduled cardioversion, please notify our office immediately.   If your procedure is canceled in the cardioversion suite you will be charged a cancellation fee.    Hold below medications 72 hours prior to scheduled procedure/anesthesia. Restart medication on the following day after scheduled procedure/anesthesia  Empagliflozin  (Jardiance )    For those patients who have a scheduled procedure/anesthesia on the same day of the week as their dose, hold the medication on the day of surgery.  They can take their scheduled dose the week before.  **Patients on the above medications scheduled for elective procedures that have not held the medication for the appropriate amount of time are at risk of cancellation or change in the anesthetic plan.   Beginning in 2025, the prescription drug law requires all Medicare prescription drug plans (Medicare Part D plans)--including both standalone Medicare prescription drug plans and Medicare Advantage plans with prescription drug coverage--to offer Part D enrollees the option to pay out-of-pocket prescription drug costs in the form of monthly payments instead of all at once at the pharmacy. This program, called the Medicare Prescription Payment Plan, will be helpful for people with high cost sharing earlier in the plan year by spreading out those expenses over the course of the plan year.  What is the Medicare Prescription  Payment Plan? The Medicare Prescription Payment Plan is a new payment option created under the Inflation Reduction Act that requires Part D plan sponsors to provide their enrollees with the option to pay out-of-pocket prescription drug costs in the form of monthly payments over the course of the plan year instead of all at once to the pharmacy. The program begins on June 18, 2023.  Program participants will pay $0 to the pharmacy for covered Part D drugs, and Part D plan sponsors will then bill program participants monthly for any cost sharing they incur while in the program. Pharmacies will be paid in full by the Part D sponsor in accordance with Part D prompt payment requirements  If there are issues with costs of your medications with new medicare changes ---call your DRUG INSURANCE to discuss payment plan to reduce your monthly costs.

## 2023-07-04 ENCOUNTER — Encounter: Payer: Self-pay | Admitting: *Deleted

## 2023-07-04 ENCOUNTER — Telehealth: Payer: Self-pay | Admitting: *Deleted

## 2023-07-04 NOTE — Telephone Encounter (Signed)
Called and made patient aware that lab work per Dr. Bjorn Pippin cholesterol looks good. Patient verbalized an understanding.

## 2023-07-04 NOTE — Telephone Encounter (Signed)
-----   Message from Little Ishikawa sent at 06/27/2023 11:29 AM EST ----- Cholesterol looks good

## 2023-07-06 ENCOUNTER — Ambulatory Visit (HOSPITAL_COMMUNITY)
Admission: RE | Admit: 2023-07-06 | Discharge: 2023-07-06 | Disposition: A | Discharge status: Home / Self Care | Payer: Medicare Other | Source: Ambulatory Visit | Attending: Internal Medicine | Admitting: Internal Medicine

## 2023-07-06 DIAGNOSIS — R918 Other nonspecific abnormal finding of lung field: Secondary | ICD-10-CM | POA: Diagnosis not present

## 2023-07-06 DIAGNOSIS — I700 Atherosclerosis of aorta: Secondary | ICD-10-CM | POA: Diagnosis not present

## 2023-07-09 ENCOUNTER — Encounter: Payer: Self-pay | Admitting: Cardiovascular Disease

## 2023-07-11 ENCOUNTER — Telehealth: Payer: Self-pay | Admitting: Cardiology

## 2023-07-11 NOTE — Telephone Encounter (Signed)
Called and spoke with the patient. Assured that his dental appt/procedure 07/14/23 will not interfere with his Cardioversion 07/15/23. He verbalized understanding.

## 2023-07-11 NOTE — Telephone Encounter (Signed)
Patient stated he is having a dental cleaning on Monday, 1/27 at 10:00 am and wants to know if this will affect his procedure scheduled on Tuesday, 1/28.  Patient noted his dental office closes at noon today.

## 2023-07-14 DIAGNOSIS — K080 Exfoliation of teeth due to systemic causes: Secondary | ICD-10-CM | POA: Diagnosis not present

## 2023-07-14 NOTE — Anesthesia Preprocedure Evaluation (Signed)
Anesthesia Evaluation  Patient identified by MRN, date of birth, ID band Patient awake    Reviewed: Allergy & Precautions, NPO status , Patient's Chart, lab work & pertinent test results  Airway Mallampati: II  TM Distance: >3 FB Neck ROM: Full    Dental no notable dental hx.    Pulmonary neg pulmonary ROS, former smoker   Pulmonary exam normal        Cardiovascular hypertension, Pt. on medications and Pt. on home beta blockers + dysrhythmias Atrial Fibrillation + pacemaker  Rhythm:Regular Rate:Normal  CV: Echo 11/15/2022 1. Left ventricular ejection fraction, by estimation, is 45 to 50%. Left  ventricular ejection fraction by 2D MOD biplane is 47.0 %. The left  ventricle has mildly decreased function. The left ventricle demonstrates  global hypokinesis. The left  ventricular internal cavity size was mildly dilated. Left ventricular  diastolic parameters are indeterminate.   2. Right ventricular systolic function is normal. The right ventricular  size is moderately enlarged. Tricuspid regurgitation signal is inadequate  for assessing PA pressure.   3. Left atrial size was moderately dilated.   4. Right atrial size was severely dilated.   5. The mitral valve is abnormal. Moderate mitral valve regurgitation.   6. The aortic valve is tricuspid. Aortic valve regurgitation is trivial.   7. The inferior vena cava is normal in size with <50% respiratory  variability, suggesting right atrial pressure of 8 mmHg.   8. Evidence of atrial level shunting detected by color flow Doppler.  There is a small secundum atrial septal defect with predominantly left to  right shunting across the atrial septum.       Neuro/Psych   Anxiety     negative neurological ROS     GI/Hepatic Neg liver ROS,GERD  ,,  Endo/Other  Hypothyroidism    Renal/GU negative Renal ROS  negative genitourinary   Musculoskeletal  (+) Arthritis , Osteoarthritis,     Abdominal Normal abdominal exam  (+)   Peds  Hematology negative hematology ROS (+)   Anesthesia Other Findings   Reproductive/Obstetrics                             Anesthesia Physical Anesthesia Plan  ASA: 3  Anesthesia Plan: General   Post-op Pain Management: Minimal or no pain anticipated   Induction: Intravenous  PONV Risk Score and Plan: 2 and Treatment may vary due to age or medical condition and Propofol infusion  Airway Management Planned: Mask, Natural Airway and Simple Face Mask  Additional Equipment: None  Intra-op Plan:   Post-operative Plan:   Informed Consent: I have reviewed the patients History and Physical, chart, labs and discussed the procedure including the risks, benefits and alternatives for the proposed anesthesia with the patient or authorized representative who has indicated his/her understanding and acceptance.     Dental advisory given  Plan Discussed with: CRNA and Anesthesiologist  Anesthesia Plan Comments: (See PAT note 03/11/2023)       Anesthesia Quick Evaluation

## 2023-07-14 NOTE — Progress Notes (Signed)
Spoke to pt and instructed them to come at 1130 and to be NPO after 0000.  Confirmed no missed doses of AC and instructed to take in AM with a small sip of water.  Confirmed that pt will have a ride home and someone to stay with them for 24 hours after the procedure. Instructed patient to not wear any jewelry or lotion.

## 2023-07-15 ENCOUNTER — Ambulatory Visit (HOSPITAL_BASED_OUTPATIENT_CLINIC_OR_DEPARTMENT_OTHER): Payer: Medicare Other | Admitting: Anesthesiology

## 2023-07-15 ENCOUNTER — Encounter (HOSPITAL_COMMUNITY): Payer: Self-pay | Admitting: Cardiovascular Disease

## 2023-07-15 ENCOUNTER — Ambulatory Visit (HOSPITAL_COMMUNITY): Payer: Self-pay | Admitting: Anesthesiology

## 2023-07-15 ENCOUNTER — Other Ambulatory Visit: Payer: Self-pay

## 2023-07-15 ENCOUNTER — Encounter (HOSPITAL_COMMUNITY)
Admission: RE | Disposition: A | Discharge status: Home / Self Care | Payer: Self-pay | Source: Ambulatory Visit | Attending: Cardiovascular Disease

## 2023-07-15 ENCOUNTER — Ambulatory Visit (HOSPITAL_COMMUNITY)
Admission: RE | Admit: 2023-07-15 | Discharge: 2023-07-15 | Disposition: A | Discharge status: Home / Self Care | Payer: Medicare Other | Source: Ambulatory Visit | Attending: Cardiovascular Disease | Admitting: Cardiovascular Disease

## 2023-07-15 DIAGNOSIS — E039 Hypothyroidism, unspecified: Secondary | ICD-10-CM | POA: Diagnosis not present

## 2023-07-15 DIAGNOSIS — Z7901 Long term (current) use of anticoagulants: Secondary | ICD-10-CM | POA: Insufficient documentation

## 2023-07-15 DIAGNOSIS — I4891 Unspecified atrial fibrillation: Secondary | ICD-10-CM | POA: Diagnosis not present

## 2023-07-15 DIAGNOSIS — K219 Gastro-esophageal reflux disease without esophagitis: Secondary | ICD-10-CM | POA: Insufficient documentation

## 2023-07-15 DIAGNOSIS — Z950 Presence of cardiac pacemaker: Secondary | ICD-10-CM | POA: Diagnosis not present

## 2023-07-15 DIAGNOSIS — D6869 Other thrombophilia: Secondary | ICD-10-CM | POA: Insufficient documentation

## 2023-07-15 DIAGNOSIS — I10 Essential (primary) hypertension: Secondary | ICD-10-CM | POA: Insufficient documentation

## 2023-07-15 DIAGNOSIS — I110 Hypertensive heart disease with heart failure: Secondary | ICD-10-CM | POA: Diagnosis not present

## 2023-07-15 DIAGNOSIS — I480 Paroxysmal atrial fibrillation: Secondary | ICD-10-CM | POA: Insufficient documentation

## 2023-07-15 DIAGNOSIS — I4892 Unspecified atrial flutter: Secondary | ICD-10-CM | POA: Insufficient documentation

## 2023-07-15 DIAGNOSIS — F419 Anxiety disorder, unspecified: Secondary | ICD-10-CM | POA: Diagnosis not present

## 2023-07-15 DIAGNOSIS — Z87891 Personal history of nicotine dependence: Secondary | ICD-10-CM | POA: Insufficient documentation

## 2023-07-15 DIAGNOSIS — I5020 Unspecified systolic (congestive) heart failure: Secondary | ICD-10-CM | POA: Diagnosis not present

## 2023-07-15 HISTORY — PX: CARDIOVERSION: EP1203

## 2023-07-15 SURGERY — CARDIOVERSION (CATH LAB)
Anesthesia: General

## 2023-07-15 MED ORDER — ACETAMINOPHEN 325 MG PO TABS: 325.0000 mg | ORAL_TABLET | ORAL | Status: DC | PRN

## 2023-07-15 MED ORDER — SODIUM CHLORIDE 0.9% FLUSH: 3.0000 mL | Freq: Two times a day (BID) | INTRAVENOUS | Status: DC

## 2023-07-15 MED ORDER — OXYCODONE HCL 5 MG/5ML PO SOLN: 5.0000 mg | Freq: Once | ORAL | Status: DC | PRN

## 2023-07-15 MED ORDER — OXYCODONE HCL 5 MG PO TABS: 5.0000 mg | ORAL_TABLET | Freq: Once | ORAL | Status: DC | PRN

## 2023-07-15 MED ORDER — FENTANYL CITRATE (PF) 100 MCG/2ML IJ SOLN: 25.0000 ug | INTRAMUSCULAR | Status: DC | PRN

## 2023-07-15 MED ORDER — MEPERIDINE HCL 25 MG/ML IJ SOLN: 6.2500 mg | INTRAMUSCULAR | Status: DC | PRN

## 2023-07-15 MED ORDER — SODIUM CHLORIDE 0.9% FLUSH: 3.0000 mL | INTRAVENOUS | Status: DC | PRN

## 2023-07-15 MED ORDER — PROPOFOL 10 MG/ML IV BOLUS
INTRAVENOUS | Status: DC | PRN
  Administered 2023-07-15: 20 mg via INTRAVENOUS
  Administered 2023-07-15: 40 mg via INTRAVENOUS

## 2023-07-15 MED ORDER — LIDOCAINE 2% (20 MG/ML) 5 ML SYRINGE
INTRAMUSCULAR | Status: DC | PRN
  Administered 2023-07-15: 60 mg via INTRAVENOUS

## 2023-07-15 MED ORDER — ACETAMINOPHEN 160 MG/5ML PO SOLN: 325.0000 mg | ORAL | Status: DC | PRN

## 2023-07-15 MED ORDER — ONDANSETRON HCL 4 MG/2ML IJ SOLN: 4.0000 mg | Freq: Once | INTRAMUSCULAR | Status: DC | PRN

## 2023-07-15 SURGICAL SUPPLY — 1 items: PAD DEFIB RADIO PHYSIO CONN (PAD) ×1 IMPLANT

## 2023-07-15 NOTE — Interval H&P Note (Signed)
History and Physical Interval Note:  07/15/2023 12:23 PM  Carlos Choi  has presented today for surgery, with the diagnosis of AFIB.  The various methods of treatment have been discussed with the patient and family. After consideration of risks, benefits and other options for treatment, the patient has consented to  Procedure(s): CARDIOVERSION (N/A) as a surgical intervention.  [name] history has been reviewed, patient examined, no change in status, stable for surgery.  I have reviewed the patient's chart and labs.  Questions were answered to the patient's satisfaction.     [name], MD

## 2023-07-15 NOTE — Transfer of Care (Signed)
Immediate Anesthesia Transfer of Care Note  Patient: Carlos Choi  Procedure(s) Performed: CARDIOVERSION  Patient Location: PACU and Cath Lab  Anesthesia Type:MAC  Level of Consciousness: drowsy  Airway & Oxygen Therapy: Patient Spontanous Breathing and Patient connected to nasal cannula oxygen  Post-op Assessment: Report given to RN and Post -op Vital signs reviewed and stable  Post vital signs: Reviewed and stable  Last Vitals:  Vitals Value Taken Time  BP    Temp    Pulse    Resp    SpO2      Last Pain:  Vitals:   07/15/23 1141  TempSrc: Temporal  PainSc: 0-No pain         Complications: No notable events documented.

## 2023-07-15 NOTE — Anesthesia Postprocedure Evaluation (Signed)
Anesthesia Post Note  Patient: Carlos Choi  Procedure(s) Performed: CARDIOVERSION     Patient location during evaluation: PACU Anesthesia Type: General Level of consciousness: awake and alert Pain management: pain level controlled Vital Signs Assessment: post-procedure vital signs reviewed and stable Respiratory status: spontaneous breathing, nonlabored ventilation, respiratory function stable and patient connected to nasal cannula oxygen Cardiovascular status: blood pressure returned to baseline and stable Postop Assessment: no apparent nausea or vomiting Anesthetic complications: no   No notable events documented.  Last Vitals:  Vitals:   07/15/23 1141 07/15/23 1247  BP: 134/78   Pulse: 75   Resp: 17   Temp: 36.7 C 36.7 C  SpO2: 100%     Last Pain:  Vitals:   07/15/23 1247  TempSrc: Temporal  PainSc: 0-No pain                 Giordano Getman

## 2023-07-15 NOTE — Anesthesia Procedure Notes (Signed)
Procedure Name: MAC Date/Time: 07/15/2023 12:20 PM  Performed by: Garfield Cornea, CRNAPre-anesthesia Checklist: Patient identified, Emergency Drugs available, Suction available and Patient being monitored Oxygen Delivery Method: Nasal cannula Dental Injury: Teeth and Oropharynx as per pre-operative assessment

## 2023-07-15 NOTE — Discharge Instructions (Signed)

## 2023-07-15 NOTE — CV Procedure (Signed)
Electrical Cardioversion Procedure Note Carlos Choi [id number] [birth year]  Procedure: Electrical Cardioversion Indications:  Atrial Flutter  Procedure Details Consent: Risks of procedure as well as the alternatives and risks of each were explained to the (patient/caregiver).  Consent for procedure obtained. Time Out: Verified patient identification, verified procedure, site/side was marked, verified correct patient position, special equipment/implants available, medications/allergies/relevent history reviewed, required imaging and test results available.  Performed  Patient placed on cardiac monitor, pulse oximetry, supplemental oxygen as necessary.  Sedation given:  propofol Pacer pads placed anterior and posterior chest.  Cardioverted 1 time(s).  Cardioverted at 200J.  Evaluation Findings: Post procedure EKG shows: ASVP Complications: None Patient did tolerate procedure well.   Carlos Si, MD 07/15/2023, 12:41 PM

## 2023-07-30 ENCOUNTER — Ambulatory Visit (HOSPITAL_COMMUNITY)
Admission: RE | Admit: 2023-07-30 | Discharge: 2023-07-30 | Disposition: A | Discharge status: Home / Self Care | Payer: Medicare Other | Source: Ambulatory Visit | Attending: Internal Medicine | Admitting: Internal Medicine

## 2023-07-30 VITALS — BP 130/70 | HR 75 | Ht 76.0 in | Wt 183.6 lb

## 2023-07-30 DIAGNOSIS — D6869 Other thrombophilia: Secondary | ICD-10-CM | POA: Insufficient documentation

## 2023-07-30 DIAGNOSIS — I4892 Unspecified atrial flutter: Secondary | ICD-10-CM | POA: Diagnosis not present

## 2023-07-30 DIAGNOSIS — I483 Typical atrial flutter: Secondary | ICD-10-CM

## 2023-07-30 DIAGNOSIS — I2510 Atherosclerotic heart disease of native coronary artery without angina pectoris: Secondary | ICD-10-CM | POA: Diagnosis not present

## 2023-07-30 DIAGNOSIS — Z87891 Personal history of nicotine dependence: Secondary | ICD-10-CM | POA: Insufficient documentation

## 2023-07-30 DIAGNOSIS — E039 Hypothyroidism, unspecified: Secondary | ICD-10-CM | POA: Diagnosis not present

## 2023-07-30 DIAGNOSIS — Z7901 Long term (current) use of anticoagulants: Secondary | ICD-10-CM | POA: Diagnosis not present

## 2023-07-30 DIAGNOSIS — Z950 Presence of cardiac pacemaker: Secondary | ICD-10-CM | POA: Insufficient documentation

## 2023-07-30 DIAGNOSIS — I4891 Unspecified atrial fibrillation: Secondary | ICD-10-CM | POA: Diagnosis not present

## 2023-07-30 NOTE — Progress Notes (Signed)
Primary Care Physician: [name], MD Primary Cardiologist: Dr. Bjorn Pippin  Primary Electrophysiologist: Dr. [name] Referring Physician:    [name] is a 78 y.o. male with a history of bronchiectasis, history of tobacco use, chronic sinusitis, hypothyroidism, CAD, and atrial flutter who presents for consultation in the [facility]. The patient was initially diagnosed with atrial flutter on day of procedure for maxillary antrostomy. He was found to be in atrial flutter from review of records on day of procedure with variable HR 35-70 bpm so procedure was canceled. He does not have cardiac awareness of arrhythmia. Patient was on aspirin 81 mg every other day. He is currently on Eliquis 5 mg BID for a CHADS2VASC score of 4.  On evaluation today, he is in atrial flutter. He cannot feel when he is in arrhythmia and was surprised when he was told on day of surgical procedure. It was canceled. He feels tired but states this symptom has been going on for months, since last year.   On follow up 11/20/22, he is in NSR with 1st degree AV block with PVCs in a pattern of bigeminy (versus Wenckebach). He underwent successful cardioversion on 11/14/22 however bradycardia with AV block noted post-conversion so patient was admitted for observation. As anesthesia wore off, patient had various degrees of conduction abnormality including first degree AVB, then second degree type I AV block, then 2-1 AV nodal conduction with junctional escape. He was not presyncopal. TTE showed newly noted decreased LVEF and placed on losartan 25 mg daily. Scheduled for cardiac PET stress to evaluate coronary anatomy. He is scheduled to see Dr. [name] on 6/21 for PPM evaluation. He has been compliant with Eliquis 5 mg BID. No episodes of syncope. He monitors his rhythm with Kardiamobile device.   On follow up 07/01/23, he is currently in atrial flutter. Device review on 06/27/23 showed persistent  atrial flutter with controlled ventricular rates and 24% burden. He missed a dose of Eliquis the week before 1/6.   On follow up 07/30/23, he is currently in AV dual paced rhythm. S/p successful DCCV on 07/15/23. No missed doses of Eliquis.   Today, he denies symptoms of palpitations, chest pain, shortness of breath, orthopnea, PND, lower extremity edema, dizziness, presyncope, syncope, snoring, daytime somnolence, bleeding, or neurologic sequela. The patient is tolerating medications without difficulties and is otherwise without complaint today.   Atrial Fibrillation Risk Factors:  he does not have symptoms or diagnosis of sleep apnea. he does not have a history of rheumatic fever. he does not have a history of alcohol use. The patient does not have a history of early familial atrial fibrillation or other arrhythmias.  he has a BMI of Body mass index is 22.35 kg/m.[name] Filed Weights   07/30/23 1504  Weight: 83.3 kg    Family History  Problem Relation Age of Onset   Aneurysm Mother    Alzheimer's disease Father    Cancer Sister        ovarian   Stroke Brother    Hypertension Brother    Colon cancer Neg Hx    Esophageal cancer Neg Hx    Stomach cancer Neg Hx    Rectal cancer Neg Hx    Colon polyps Neg Hx     Atrial Fibrillation Management history:  Previous antiarrhythmic drugs: None Previous cardioversions: 11/14/22, 07/15/23 Previous ablations: None Anticoagulation history: Eliquis 5 mg BID   Past Medical History:  Diagnosis Date   Allergy  Anxiety    Arthritis    mild ankles    ASD (atrial septal defect)    ASD (small secundum ASD with predominantly left-to-right shunting) 11/15/22 TTE   Atrial flutter (HCC)    s/p DCCV 11/04/22   Bronchiectasis (HCC)    Cataract    removed left eye, forming right eye    Diverticulosis    Dysrhythmia    2nd degree AV block, bradycardia s/p PPM 12/26/22; PVCs   GERD (gastroesophageal reflux disease)    Hemorrhoids    History of  kidney stones    Hypertension    Hypothyroid    Presence of permanent cardiac pacemaker 12/26/2022   St. Jude/Abbott   Rectal bleeding    due to hemorrhoid    Tubular adenoma of colon 2015   Past Surgical History:  Procedure Laterality Date   CARDIOVERSION N/A 11/14/2022   Procedure: CARDIOVERSION;  Surgeon: Jake Bathe, MD;  Location: [facility] INVASIVE CV LAB;  Service: Cardiovascular;  Laterality: N/A;   CARDIOVERSION N/A 07/15/2023   Procedure: CARDIOVERSION;  Surgeon: [name], MD;  Location: [facility] INVASIVE CV LAB;  Service: Cardiovascular;  Laterality: N/A;   CATARACT EXTRACTION Left    COLONOSCOPY     CORONARY STENT INTERVENTION N/A 05/02/2023   Procedure: CORONARY STENT INTERVENTION;  Surgeon: Swaziland, Peter M, MD;  Location: [facility] INVASIVE CV LAB;  Service: Cardiovascular;  Laterality: N/A;   CYSTOSCOPY/URETEROSCOPY/HOLMIUM LASER/STENT PLACEMENT Left 03/18/2023   Procedure: CYSTOSCOPY LEFT URETEROSCOPY/STENT PLACEMENT;  Surgeon: Bjorn Pippin, MD;  Location: [facility] ORS;  Service: Urology;  Laterality: Left;  1 HR FOR CASE   HERNIA REPAIR Bilateral    Over ten years ago per patient. He was not sure of the date.   NASAL SINUS SURGERY Left 02/05/2023   Procedure: ENDOSCOPIC MAXILLARY ANTROSTOMY WITH TISSUE REMOVAL;  Surgeon: Serena Colonel, MD;  Location: [facility] OR;  Service: ENT;  Laterality: Left;   PACEMAKER IMPLANT N/A 12/26/2022   Procedure: PACEMAKER IMPLANT;  Surgeon: Maurice Small, MD;  Location: [facility] INVASIVE CV LAB;  Service: Cardiovascular;  Laterality: N/A;   POLYPECTOMY     RIGHT/LEFT HEART CATH AND CORONARY ANGIOGRAPHY N/A 05/02/2023   Procedure: RIGHT/LEFT HEART CATH AND CORONARY ANGIOGRAPHY;  Surgeon: Swaziland, Peter M, MD;  Location: [facility] INVASIVE CV LAB;  Service: Cardiovascular;  Laterality: N/A;   ROTATOR CUFF REPAIR     both shoulders - patient does not remember the exact date    Current Outpatient Medications  Medication Sig Dispense Refill   ALPRAZolam (XANAX) 0.25 MG  tablet Take 1 tablet (0.25 mg total) by mouth daily as needed for anxiety. 90 tablet 0   apixaban (ELIQUIS) 5 MG TABS tablet Take 1 tablet (5 mg total) by mouth 2 (two) times daily. 60 tablet 5   atorvastatin (LIPITOR) 40 MG tablet Take 1 tablet (40 mg total) by mouth daily. (Patient taking differently: Take 40 mg by mouth daily with supper.) 90 tablet 3   clopidogrel (PLAVIX) 75 MG tablet Take 1 tablet (75 mg total) by mouth daily. 90 tablet 0   empagliflozin (JARDIANCE) 10 MG TABS tablet Take 1 tablet (10 mg total) by mouth daily before breakfast. 90 tablet 3   Guaifenesin (MUCINEX MAXIMUM STRENGTH) 1200 MG TB12 Take 1,200 mg by mouth 2 (two) times daily as needed (Congestion).     ipratropium (ATROVENT) 0.03 % nasal spray Place 2 sprays into both nostrils daily as needed for rhinitis.     Lactobacillus-Inulin (CULTURELLE DIGESTIVE HEALTH PO) Take 1 capsule by mouth  daily with lunch.     levothyroxine (SYNTHROID) 100 MCG tablet Take 1 tablet (100 mcg total) by mouth daily. 90 tablet 3   loratadine (CLARITIN) 10 MG tablet Take 10 mg by mouth daily with breakfast.     losartan (COZAAR) 25 MG tablet Take 1 tablet (25 mg total) by mouth daily. 90 tablet 3   metoprolol succinate (TOPROL-XL) 50 MG 24 hr tablet Take 1 tablet (50 mg total) by mouth daily. Take with or immediately following a meal. 30 tablet 6   Multiple Vitamins-Minerals (CENTRUM SILVER PO) Take 1 tablet by mouth daily with lunch.     nitroGLYCERIN (NITROSTAT) 0.4 MG SL tablet Place 1 tablet (0.4 mg total) under the tongue every 5 (five) minutes as needed for chest pain. 25 tablet 3   Omega-3 Fatty Acids (FISH OIL) 1000 MG CAPS Take 2,000 mg by mouth daily with lunch.     pantoprazole (PROTONIX) 40 MG tablet Take 1 tablet (40 mg total) by mouth daily. 90 tablet 3   silodosin (RAPAFLO) 8 MG CAPS capsule Take 8 mg by mouth daily with supper.     Sod Bicarb-K Bicarb-Citric Acd (ALKA-SELTZER GOLD) [phone] MG TBEF Take 2 tablets by mouth  daily as needed (upset stomach/indigestion.).     Sodium Chloride-Sodium Bicarb (NETI POT SINUS WASH NA) Place 1 Dose into the nose 2 (two) times daily.     spironolactone (ALDACTONE) 25 MG tablet Take 0.5 tablets (12.5 mg total) by mouth every other day. 15 tablet 3   No current facility-administered medications for this encounter.    No Known Allergies  ROS- All systems are reviewed and negative except as per the HPI above.  Physical Exam: Vitals:   07/30/23 1504  BP: 130/70  Pulse: 75  Weight: 83.3 kg  Height: 6\' 4"  (1.93 m)    GEN- The patient is well appearing, alert and oriented x 3 today.   Neck - no JVD or carotid bruit noted Lungs- Clear to ausculation bilaterally, normal work of breathing Heart- Regular rate and rhythm, no murmurs, rubs or gallops, PMI not laterally displaced Extremities- no clubbing, cyanosis, or edema Skin - no rash or ecchymosis noted   Wt Readings from Last 3 Encounters:  07/30/23 83.3 kg  07/15/23 79.4 kg  07/01/23 82.4 kg    EKG today demonstrates  Vent. rate 75 BPM PR interval 186 ms QRS duration 168 ms QT/QTcB 458/511 ms P-R-T axes 24 -71 97 AV dual-paced rhythm with occasional Premature ventricular complexes Abnormal ECG When compared with ECG of 30-Jul-2023 15:18, PREVIOUS ECG IS PRESENT  Echo 04/04/23: 1. Left ventricular ejection fraction, by estimation, is 25 to 30%. Left  ventricular ejection fraction by 3D volume is 26 %. The left ventricle has  severely decreased function. The left ventricle demonstrates global  hypokinesis. The left ventricular  internal cavity size was mildly dilated. There is mild eccentric left  ventricular hypertrophy. Left ventricular diastolic parameters are  indeterminate.   2. Right ventricular systolic function is mildly reduced. The right  ventricular size is mildly enlarged. There is normal pulmonary artery  systolic pressure.   3. Left atrial size was mildly dilated.   4. The mitral valve  is normal in structure. Mild mitral valve  regurgitation.   5. The aortic valve is tricuspid. Aortic valve regurgitation is not  visualized.   6. Evidence of atrial level shunting detected by color flow Doppler.  There is a small secundum atrial septal defect with predominantly left to  right  shunting across the atrial septum.   Comparison(s): Changes from prior study are noted. LV function is  significantly worse.    Epic records are reviewed at length today.  CHA2DS2-VASc Score = 3  [name] score is based upon: CHF History: 1 HTN History: 0 Diabetes History: 0 Stroke History: 0 Vascular Disease History: 0 Age Score: 2 Gender Score: 0       ASSESSMENT AND PLAN: Paroxysmal Atrial Flutter (ICD10:  I48.0) [name] CHA2DS2-VASc score is 3, indicating a 3.2% annual risk of stroke.   S/p DCCV on 11/14/22 with post conversion AV block. S/p PPM placement on 12/26/22 by Dr. [name].  S/p successful DCCV on 07/15/23.   He is currently in AV dual paced rhythm. Continue Toprol 50 mg daily.   Per Dr. Bjorn Pippin plan to repeat echocardiogram in 3 months (09/23/23) and plan for upgrade to CRT-D if LV function remains less than 35%. Will schedule f/u with Dr. [name] after echo to assess and also discuss atrial flutter ablation.   2. Secondary hypercoagulable state due to atrial flutter Continue Eliquis 5 mg BID without interruption.   3. S/p PPM placement on 12/26/22 Followed by Dr. [name] / device clinic.     Follow up Dr. [name] in April.   [name], PA-C [facility] [address] [place], [place] [address] [phone] 07/30/2023 3:57 PM

## 2023-07-30 NOTE — Progress Notes (Unsigned)
Carlos Choi, male    DOB: 15-Aug-1945       MRN: [id number]   Brief patient profile:  77  yowm minimal smoking hx with acute purulent bronchitis in Late July 2021 resolved on zmax/augmentin clinically but f/u cxr/ ct concerning for cavitary nodules so referred to pulmonary clinic in [place]  05/18/2020 by Dr  Tanya Nones with neg Quant TB and anca aleady done       History of Present Illness  05/18/2020  Pulmonary/ 1st office eval/ [place]  Chief Complaint  Patient presents with   Pulmonary Consult    Referred by Dr. Tanya Nones for eval of cavitary lesion. Pt states that he has had cough since Jan 2021. This has been much improved since round of augmentin taken Aug 2021. He does have some cough in the am with clear sputum.   Dyspnea:  Not limited, very active  Cough: a little of stick white mucus in am not every day  Sleep: 30 degrees on electric SABA use: none rec Bronchiectasis =   you have scarring of your bronchial tubes which means that they don't function perfectly normally and mucus tends to pool in certain areas of your lung which can cause pneumonia and further scarring of your lung and bronchial tubes Whenever you develop cough congestion take mucinex (wet cough) or mucinex dm (dry cough) up to 1200 mg every 12hours    Phone note  10/13/20   He states dx with covid 19 on 10/08/20- called PCP 10/10/20 and they put him on paxlovid  He is feeling much improved     11/03/2020  f/u ov/[place] office/Rober Skeels re: bronchiectasis  Chief Complaint  Patient presents with   Follow-up    Productive cough with clear and sometimes yellow phlegm   Dyspnea:  Walking daily up to 1.5 miles per day s sob Cough: baselinecough in am / clear mucus - not sure took mucinex dm in max doses but got thru covid fine p paxlovid, no abx Sleeping: 30 degrees hob  SABA use: none  02: none  Covid status: x 3  Rec For cough / congestion > mucinex dm 1200 mg every 12 hours as needed and  flutter valve as much as you want if you feel it's helping  Schedule PFTs next available at [facility]        07/08/2022  f/u ov/[facility] Whitter re: bronchiectasis  maint  mucinex dm/ flutter valve only  Chief Complaint  Patient presents with   Follow-up    Doing well today.  Did have viral infection December 2023.  A lot of mucus in sinus and chest.  Completed levoquin and amoxil.  Using flutter valve when needed.  Dyspnea:  walking regularly up hills too s limiting sob  Cough: minimal am  white glob and easy to clear x several years / using flutter valve  Sleeping: 30 degrees hob  SABA use: none  02: none  Rec Bronchiectasis is your diagnosis  My office will be contacting you by phone for referral to [facility] sinus and chest ct     [facility] eval 05/20/23    CT 07/06/23 1. Stable bilateral pulmonary nodules measuring up to 8 mm in the right upper lobe compared to 2021. No new pulmonary nodules are seen. 2. Stable peripheral bronchiectasis with mucoid plugging in the anterior left upper lobe. 3. No acute cardiopulmonary process. 4. Aortic atherosclerosis.   07/31/2023  f/u ov/[place] office/Dari Carpenito re: bronchiectasis  maint on prn mucinex  Chief Complaint  Patient presents with   Cough  Dyspnea: [place]  mile and half in 30 min daily  Cough: am slt yellowish assoc with purulent nettipot secretions /  Sleeping: 30 degrees HOB s    resp cc  SABA use: none  02: none     No obvious day to day or daytime variability or assoc excess/ purulent sputum or mucus plugs or hemoptysis or cp or chest tightness, subjective wheeze or overt  hb symptoms.    Also denies any obvious fluctuation of symptoms with weather or environmental changes or other aggravating or alleviating factors except as outlined above   No unusual exposure hx or h/o childhood pna/ asthma or knowledge of premature birth.  Current Allergies, Complete Past Medical History, Past Surgical History, Family History, and Social History  were reviewed in [employer] record.  ROS  The following are not active complaints unless bolded Hoarseness, sore throat, dysphagia, dental problems, itching, sneezing,  nasal congestion or discharge of excess mucus or purulent secretions, ear ache,   fever, chills, sweats, unintended wt loss or wt gain, classically pleuritic or exertional cp,  orthopnea pnd or arm/hand swelling  or leg swelling, presyncope, palpitations, abdominal pain, anorexia, nausea, vomiting, diarrhea  or change in bowel habits or change in bladder habits, change in stools or change in urine, dysuria, hematuria,  rash, arthralgias, visual complaints, headache, numbness, weakness or ataxia or problems with walking or coordination,  change in mood or  memory.        Current Meds  Medication Sig   ALPRAZolam (XANAX) 0.25 MG tablet Take 1 tablet (0.25 mg total) by mouth daily as needed for anxiety.   apixaban (ELIQUIS) 5 MG TABS tablet Take 1 tablet (5 mg total) by mouth 2 (two) times daily.   atorvastatin (LIPITOR) 40 MG tablet Take 1 tablet (40 mg total) by mouth daily. (Patient taking differently: Take 40 mg by mouth daily with supper.)   clopidogrel (PLAVIX) 75 MG tablet Take 1 tablet (75 mg total) by mouth daily.   empagliflozin (JARDIANCE) 10 MG TABS tablet Take 1 tablet (10 mg total) by mouth daily before breakfast.   Guaifenesin (MUCINEX MAXIMUM STRENGTH) 1200 MG TB12 Take 1,200 mg by mouth 2 (two) times daily as needed (Congestion).   ipratropium (ATROVENT) 0.03 % nasal spray Place 2 sprays into both nostrils daily as needed for rhinitis.   Lactobacillus-Inulin (CULTURELLE DIGESTIVE HEALTH PO) Take 1 capsule by mouth daily with lunch.   levothyroxine (SYNTHROID) 100 MCG tablet Take 1 tablet (100 mcg total) by mouth daily.   loratadine (CLARITIN) 10 MG tablet Take 10 mg by mouth daily with breakfast.   losartan (COZAAR) 25 MG tablet Take 1 tablet (25 mg total) by mouth daily.   metoprolol succinate  (TOPROL-XL) 50 MG 24 hr tablet Take 1 tablet (50 mg total) by mouth daily. Take with or immediately following a meal.   Multiple Vitamins-Minerals (CENTRUM SILVER PO) Take 1 tablet by mouth daily with lunch.   nitroGLYCERIN (NITROSTAT) 0.4 MG SL tablet Place 1 tablet (0.4 mg total) under the tongue every 5 (five) minutes as needed for chest pain.   Omega-3 Fatty Acids (FISH OIL) 1000 MG CAPS Take 2,000 mg by mouth daily with lunch.   pantoprazole (PROTONIX) 40 MG tablet Take 1 tablet (40 mg total) by mouth daily.   silodosin (RAPAFLO) 8 MG CAPS capsule Take 8 mg by mouth daily with supper.   Sod Bicarb-K Bicarb-Citric Acd (ALKA-SELTZER GOLD) [phone]  MG TBEF Take 2 tablets by mouth daily as needed (upset stomach/indigestion.).   Sodium Chloride-Sodium Bicarb (NETI POT SINUS WASH NA) Place 1 Dose into the nose 2 (two) times daily.   spironolactone (ALDACTONE) 25 MG tablet Take 0.5 tablets (12.5 mg total) by mouth every other day.                       Past Medical History:  Diagnosis Date   Allergy    Anxiety    Arthritis    mild ankles    Cataract    removed left eye, forming right eye    Hemorrhoids    Hypothyroid    Rectal bleeding    due to hemorrhoid       Objective:    Wts  07/31/2023        184  07/08/2022       186  11/03/20 190 lb 12.8 oz (86.5 kg)  05/18/20 194 lb (88 kg)  01/28/20 192 lb (87.1 kg)     Vital signs reviewed  07/31/2023  - Note at rest 02 sats  97% on RA   General appearance:    amb wm all smiles today     HEENT : Oropharynx  clear         NECK :  without  apparent JVD/ palpable Nodes/TM    LUNGS: no acc muscle use,  Nl contour chest which is clear to A and P bilaterally without cough on insp or exp maneuvers   CV:  RRR  no s3 or murmur or increase in P2, and no edema   ABD:  soft and nontender   MS:  Gait nl  ext warm without deformities Or obvious joint restrictions  calf tenderness, cyanosis or clubbing    SKIN: warm and dry  without lesions    NEURO:  alert, approp, nl sensorium with  no motor or cerebellar deficits apparent.      I personally reviewed images and agree with radiology impression as follows:   Chest CTw/o contrast   07/06/23 1. Stable bilateral pulmonary nodules measuring up to 8 mm in the right upper lobe compared to 2021. No new pulmonary nodules are seen. 2. Stable peripheral bronchiectasis with mucoid plugging in the anterior left upper lobe. 3. No acute cardiopulmonary process. 4. Aortic atherosclerosis.   Aortic Atherosclerosis (ICD10-I70.0).                Assessment

## 2023-07-31 ENCOUNTER — Ambulatory Visit: Payer: Medicare Other | Admitting: Internal Medicine

## 2023-07-31 ENCOUNTER — Encounter: Payer: Self-pay | Admitting: Internal Medicine

## 2023-07-31 VITALS — BP 131/82 | HR 80 | Ht 76.0 in | Wt 184.0 lb

## 2023-07-31 DIAGNOSIS — J479 Bronchiectasis, uncomplicated: Secondary | ICD-10-CM | POA: Diagnosis not present

## 2023-07-31 NOTE — Patient Instructions (Signed)
For cough/ congestion > mucinex up to maximum of  1200 mg every 12 hours and use the flutter valve as much as you can    Discuss with Dr [name] re 2nd opinion from [facility] if reach the end of what they can offer.

## 2023-08-01 NOTE — Progress Notes (Signed)
Remote pacemaker transmission.

## 2023-08-01 NOTE — Assessment & Plan Note (Signed)
Onset of symptoms July 2021/MM for alpha one - Quant TB/ anca serologies neg 01/28/20 and convincing clinical response to zmax/augmentin rx  - CT chest 06/28/2020 No significant interval change in a small cavitary nodule of the right upper lobe, measuring approximately 0.8 x 0.8 cm. This remains nonspecific, most likely infectious or inflammatory in nature, but given morphology suggest additional follow-up at 1 year to ensure ongoing stability and exclude a small malignancy>  Placed in reminder file for 06/28/21  - Flutter valve training 11/03/2020  - PFTs  11/14/20  wnl  -  Labs ordered 07/08/2022  :    quant Ig's nl     one AT phenotype MM level 158  - CT sinus 07/24/22 Complete opacification of the right maxillary sinus with internal hyperdensity, suggestive of inspissated secretions and/or fungal colonization.  - CT 07/06/23 1. Stable bilateral pulmonary nodules measuring up to 8 mm in the right upper lobe compared to 2021. No new pulmonary nodules are seen. 2. Stable peripheral bronchiectasis with mucoid plugging in the anterior left upper lobe. 3. No acute cardiopulmonary process.  His main problem is related to persistent sinusitis which in the setting of bronchiectasis, is a major concern which I asked him to address with Dr [name] and consider referral to [facility] if indicated   In terms of pulmonar rx rec For cough/ congestion > mucinex or mucinex dm  up to maximum of  1200 mg every 12 hours and use the flutter valve as much as you can    F/u can be prn in pulmonary clinic for now         Each maintenance medication was reviewed in detail including emphasizing most importantly the difference between maintenance and prns and under what circumstances the prns are to be triggered using an action plan format where appropriate.  Total time for H and P, chart review, counseling, reviewing flutter  device(s) and generating customized AVS unique to this office visit / same day charting  > 30 min  summary final pulmonary f/u

## 2023-08-11 ENCOUNTER — Other Ambulatory Visit: Payer: Self-pay | Admitting: Cardiovascular Disease

## 2023-08-11 NOTE — Telephone Encounter (Signed)
 Prescription refill request for Eliquis received. Indication: AF Last office visit: 07/30/23  Tempie Donning PA-C Scr: 0.98 on 07/01/23  Epic Age: 78 Weight: 83.3kg  Based on above findings Eliquis 5mg  twice daily is the appropriate dose.  Refill approved.

## 2023-09-20 ENCOUNTER — Other Ambulatory Visit: Payer: Self-pay | Admitting: Cardiology

## 2023-09-23 ENCOUNTER — Ambulatory Visit (HOSPITAL_COMMUNITY): Payer: Medicare Other | Attending: Cardiology

## 2023-09-23 DIAGNOSIS — I5042 Chronic combined systolic (congestive) and diastolic (congestive) heart failure: Secondary | ICD-10-CM | POA: Insufficient documentation

## 2023-09-23 LAB — ECHOCARDIOGRAM COMPLETE
Area-P 1/2: 3.91 cm2
S' Lateral: 5.2 cm

## 2023-09-24 ENCOUNTER — Encounter: Payer: Self-pay | Admitting: *Deleted

## 2023-09-24 DIAGNOSIS — I10 Essential (primary) hypertension: Secondary | ICD-10-CM | POA: Diagnosis not present

## 2023-09-24 DIAGNOSIS — Z0000 Encounter for general adult medical examination without abnormal findings: Secondary | ICD-10-CM | POA: Diagnosis not present

## 2023-09-24 DIAGNOSIS — E7800 Pure hypercholesterolemia, unspecified: Secondary | ICD-10-CM | POA: Diagnosis not present

## 2023-09-24 DIAGNOSIS — N529 Male erectile dysfunction, unspecified: Secondary | ICD-10-CM | POA: Diagnosis not present

## 2023-09-24 DIAGNOSIS — R7303 Prediabetes: Secondary | ICD-10-CM | POA: Diagnosis not present

## 2023-09-25 ENCOUNTER — Ambulatory Visit: Payer: Medicare Other | Attending: Cardiology | Admitting: Cardiology

## 2023-09-25 ENCOUNTER — Encounter: Payer: Self-pay | Admitting: Cardiology

## 2023-09-25 VITALS — BP 100/62 | HR 68 | Ht 76.0 in | Wt 181.0 lb

## 2023-09-25 DIAGNOSIS — I2510 Atherosclerotic heart disease of native coronary artery without angina pectoris: Secondary | ICD-10-CM

## 2023-09-25 DIAGNOSIS — Z79899 Other long term (current) drug therapy: Secondary | ICD-10-CM

## 2023-09-25 DIAGNOSIS — I4892 Unspecified atrial flutter: Secondary | ICD-10-CM | POA: Diagnosis not present

## 2023-09-25 DIAGNOSIS — I5042 Chronic combined systolic (congestive) and diastolic (congestive) heart failure: Secondary | ICD-10-CM

## 2023-09-25 DIAGNOSIS — I493 Ventricular premature depolarization: Secondary | ICD-10-CM

## 2023-09-25 DIAGNOSIS — Q211 Atrial septal defect, unspecified: Secondary | ICD-10-CM

## 2023-09-25 NOTE — Progress Notes (Signed)
 Cardiology Office Note:    Date:  09/25/2023   ID:  Carlos Choi, DOB 1946/01/31, MRN [id number]  PCP:  [name], MD  Cardiologist:  Little Ishikawa, MD  Electrophysiologist:  None   Referring MD: [name], MD   Chief Complaint  Patient presents with   Congestive Heart Failure    History of Present Illness:    Carlos Choi is a 78 y.o. male with a hx of atrial flutter, ASD, heart block status post PPM, bronchiectasis, hypertension, tobacco use, hypothyroidism who is referred by Dr. [name] for evaluation of heart failure.  He was initially diagnosed with atrial flutter 10/2022.  At that time he was started on Eliquis and underwent DCCV.  Presented with complete heart block and underwent PPM 12/2022.  Echocardiogram 11/15/2022 showed EF 45 to 50%, normal RV function, moderate RV enlargement, moderate left atrial enlargement, severe right atrial enlargement, moderate MR, small secundum ASD with left-to-right shunting.  Echocardiogram 04/04/2023 showed EF 25 to 30%, mild RV dysfunction, mild mitral regurgitation, small secundum ASD.  LHC/RHC 05/02/2023 showed 99% stenosis in mid LAD status post DES, no significant shunt, mildly elevated filling pressures (RA 11, RV 43/6, PA 41/25/30, PCWP 23, CI 2.9).  Cardiac MRI 05/28/2023 showed ASD with Qp/Qs 1.2, LVEF 26%, septal dyskinesis consistent with RV pacing, RVEF 42%, subendocardial LGE in mid to apical anteroseptum consistent with prior infarct, mild mitral regurgitation (regurgitant fraction 18%).  Echocardiogram 09/24/23 showed EF 25-30%, mild RV dysfunction, mild to moderate MR.  Since last clinic visit, he reports he is doing okay.  Denies any chest pain, dyspnea, lightheadedness, syncope, lower extremity edema, or palpitations.  Reports feeling tired. Walking 1.5 miles per day.   Past Medical History:  Diagnosis Date   Allergy    Anxiety    Arthritis    mild ankles    ASD (atrial septal defect)    ASD (small  secundum ASD with predominantly left-to-right shunting) 11/15/22 TTE   Atrial flutter (HCC)    s/p DCCV 11/04/22   Bronchiectasis (HCC)    Cataract    removed left eye, forming right eye    Diverticulosis    Dysrhythmia    2nd degree AV block, bradycardia s/p PPM 12/26/22; PVCs   GERD (gastroesophageal reflux disease)    Hemorrhoids    History of kidney stones    Hypertension    Hypothyroid    Presence of permanent cardiac pacemaker 12/26/2022   St. Jude/Abbott   Rectal bleeding    due to hemorrhoid    Tubular adenoma of colon 2015    Past Surgical History:  Procedure Laterality Date   CARDIOVERSION N/A 11/14/2022   Procedure: CARDIOVERSION;  Surgeon: Jake Bathe, MD;  Location: [facility] INVASIVE CV LAB;  Service: Cardiovascular;  Laterality: N/A;   CARDIOVERSION N/A 07/15/2023   Procedure: CARDIOVERSION;  Surgeon: [name], MD;  Location: [facility] INVASIVE CV LAB;  Service: Cardiovascular;  Laterality: N/A;   CATARACT EXTRACTION Left    COLONOSCOPY     CORONARY STENT INTERVENTION N/A 05/02/2023   Procedure: CORONARY STENT INTERVENTION;  Surgeon: Swaziland, Peter M, MD;  Location: [facility] INVASIVE CV LAB;  Service: Cardiovascular;  Laterality: N/A;   CYSTOSCOPY/URETEROSCOPY/HOLMIUM LASER/STENT PLACEMENT Left 03/18/2023   Procedure: CYSTOSCOPY LEFT URETEROSCOPY/STENT PLACEMENT;  Surgeon: Bjorn Pippin, MD;  Location: [facility] ORS;  Service: Urology;  Laterality: Left;  1 HR FOR CASE   HERNIA REPAIR Bilateral    Over ten years ago per patient. He was not sure  of the date.   NASAL SINUS SURGERY Left 02/05/2023   Procedure: ENDOSCOPIC MAXILLARY ANTROSTOMY WITH TISSUE REMOVAL;  Surgeon: Serena Colonel, MD;  Location: [facility] OR;  Service: ENT;  Laterality: Left;   PACEMAKER IMPLANT N/A 12/26/2022   Procedure: PACEMAKER IMPLANT;  Surgeon: Maurice Small, MD;  Location: [facility] INVASIVE CV LAB;  Service: Cardiovascular;  Laterality: N/A;   POLYPECTOMY     RIGHT/LEFT HEART CATH AND CORONARY ANGIOGRAPHY N/A  05/02/2023   Procedure: RIGHT/LEFT HEART CATH AND CORONARY ANGIOGRAPHY;  Surgeon: Swaziland, Peter M, MD;  Location: [facility] INVASIVE CV LAB;  Service: Cardiovascular;  Laterality: N/A;   ROTATOR CUFF REPAIR     both shoulders - patient does not remember the exact date    Current Medications: Current Meds  Medication Sig   ALPRAZolam (XANAX) 0.25 MG tablet Take 1 tablet (0.25 mg total) by mouth daily as needed for anxiety.   atorvastatin (LIPITOR) 40 MG tablet Take 1 tablet (40 mg total) by mouth daily. (Patient taking differently: Take 40 mg by mouth daily with supper.)   clopidogrel (PLAVIX) 75 MG tablet Take 75 mg by mouth daily.   ELIQUIS 5 MG TABS tablet TAKE ONE TABLET BY MOUTH 2 TIMES A DAY   empagliflozin (JARDIANCE) 10 MG TABS tablet Take 1 tablet (10 mg total) by mouth daily before breakfast.   Guaifenesin (MUCINEX MAXIMUM STRENGTH) 1200 MG TB12 Take 1,200 mg by mouth 2 (two) times daily as needed (Congestion).   ipratropium (ATROVENT) 0.03 % nasal spray Place 2 sprays into both nostrils daily as needed for rhinitis.   Lactobacillus-Inulin (CULTURELLE DIGESTIVE HEALTH PO) Take 1 capsule by mouth daily with lunch.   levothyroxine (SYNTHROID) 100 MCG tablet Take 1 tablet (100 mcg total) by mouth daily.   loratadine (CLARITIN) 10 MG tablet Take 10 mg by mouth daily with breakfast.   losartan (COZAAR) 25 MG tablet Take 1 tablet (25 mg total) by mouth daily.   metoprolol succinate (TOPROL-XL) 50 MG 24 hr tablet Take 1 tablet (50 mg total) by mouth daily. Take with or immediately following a meal.   Multiple Vitamins-Minerals (CENTRUM SILVER PO) Take 1 tablet by mouth daily with lunch.   nitroGLYCERIN (NITROSTAT) 0.4 MG SL tablet Place 1 tablet (0.4 mg total) under the tongue every 5 (five) minutes as needed for chest pain.   Omega-3 Fatty Acids (FISH OIL) 1000 MG CAPS Take 2,000 mg by mouth daily with lunch.   pantoprazole (PROTONIX) 40 MG tablet Take 1 tablet (40 mg total) by mouth daily.    silodosin (RAPAFLO) 8 MG CAPS capsule Take 8 mg by mouth daily with supper.   Sod Bicarb-K Bicarb-Citric Acd (ALKA-SELTZER GOLD) [phone] MG TBEF Take 2 tablets by mouth daily as needed (upset stomach/indigestion.).   Sodium Chloride-Sodium Bicarb (NETI POT SINUS WASH NA) Place 1 Dose into the nose 2 (two) times daily.     Allergies:   Patient has no known allergies.   Social History   Socioeconomic History   Marital status: Married    Spouse name: [name].   Number of children: 2   Years of education: Not on file   Highest education level: Not on file  Occupational History   Not on file  Tobacco Use   Smoking status: Former    Current packs/day: 0.00    Average packs/day: 0.5 packs/day for 6.0 years (3.0 ttl pk-yrs)    Types: Cigarettes    Start date: 06/17/1973    Quit date: 06/18/1979  Years since quitting: 44.3   Smokeless tobacco: Never  Vaping Use   Vaping status: Never Used  Substance and Sexual Activity   Alcohol use: Yes    Comment: 1-2 beers Monthly.   Drug use: No   Sexual activity: Not on file  Other Topics Concern   Not on file  Social History Narrative   Not on file   Social Drivers of Health   Financial Resource Strain: Low Risk  (03/04/2023)   Overall Financial Resource Strain (CARDIA)    Difficulty of Paying Living Expenses: Not hard at all  Food Insecurity: Low Risk  (03/09/2023)   Received from [facility]   Food Insecurity    Within the past 12 months, did the food you bought just not last and you didn't have money to get more?: No    Within the past 12 months, did you worry that your food would run out before you got money to buy more?: No  Transportation Needs: Low Risk  (03/09/2023)   Received from [facility]   Transportation Needs    Within the past 12 months, has a lack of transportation kept you from medical appointments or from doing things needed for daily living?: No  Physical Activity: Sufficiently Active  (03/04/2023)   Exercise Vital Sign    Days of Exercise per Week: 6 days    Minutes of Exercise per Session: 30 min  Stress: No Stress Concern Present (03/04/2023)   [employer] of Occupational Health - Occupational Stress Questionnaire    Feeling of Stress : Only a little  Social Connections: Moderately Integrated (03/04/2023)   Social Connection and Isolation Panel [NHANES]    Frequency of Communication with Friends and Family: More than three times a week    Frequency of Social Gatherings with Friends and Family: Twice a week    Attends Religious Services: More than 4 times per year    Active Member of [employer] or Organizations: No    Attends Banker Meetings: Never    Marital Status: Married     Family History: [name] family history includes Alzheimer's disease in his father; Aneurysm in his mother; Cancer in his sister; Hypertension in his brother; Stroke in his brother. There is no history of Colon cancer, Esophageal cancer, Stomach cancer, Rectal cancer, or Colon polyps.  ROS:   Please see the history of present illness.     All other systems reviewed and are negative.  EKGs/Labs/Other Studies Reviewed:    The following studies were reviewed today:   EKG:   03/20/2023: V-paced, frequent PVCs 06/22/2022: V-paced, PVC, rate 76  Recent Labs: 12/05/2022: ALT 16; TSH 2.06 07/01/2023: BUN 19; Creatinine, Ser 0.98; Hemoglobin 16.4; Platelets 132; Potassium 4.7; Sodium 142  Recent Lipid Panel    Component Value Date/Time   CHOL 133 06/27/2023 0925   TRIG 96 06/27/2023 0925   HDL 47 06/27/2023 0925   CHOLHDL 2.8 06/27/2023 0925   VLDL 19 06/27/2023 0925   LDLCALC 67 06/27/2023 0925   LDLCALC 58 12/05/2022 1008    Physical Exam:    VS:  BP 100/62   Pulse 68   Ht 6\' 4"  (1.93 m)   Wt 181 lb (82.1 kg)   SpO2 98%   BMI 22.03 kg/m     Wt Readings from Last 3 Encounters:  09/25/23 181 lb (82.1 kg)  07/31/23 184 lb (83.5 kg)  07/30/23 183 lb 9.6 oz  (83.3 kg)  GEN:  Well nourished, well developed in no acute distress HEENT: Normal NECK: No JVD; No carotid bruits CARDIAC: RRR, no murmurs, rubs, gallops RESPIRATORY:  Clear to auscultation without rales, wheezing or rhonchi  ABDOMEN: Soft, non-tender, non-distended MUSCULOSKELETAL:  No edema; No deformity  SKIN: Warm and dry NEUROLOGIC:  Alert and oriented x 3 PSYCHIATRIC:  Normal affect   ASSESSMENT:    1. Chronic combined systolic and diastolic heart failure (HCC)   2. CAD in native artery   3. Atrial flutter, unspecified type (HCC)   4. Medication management   5. Frequent PVCs   6. ASD (atrial septal defect)      PLAN:    Chronic combined systolic and diastolic heart failure: EF 45 to 50% on echo 10/2022.  Echocardiogram 04/04/2023 showed EF 25 to 30%, mild RV dysfunction, my left atrial lodgment, mild mitral regurgitation, small secundum ASD.  LHC/RHC 05/02/2023 showed 99% stenosis in mid LAD status post DES, no significant shunt, mildly elevated filling pressures (RA 11, RV 43/6, PA 41/25/30, PCWP 23, CI 2.9).  Cardiac MRI 05/28/2023 showed ASD with Qp/Qs 1.2, LVEF 26%, septal dyskinesis consistent with RV pacing, RVEF 42%, subendocardial LGE in mid to apical anteroseptum consistent with prior infarct, mild mitral regurgitation (regurgitant fraction 18%).  Echocardiogram 09/24/23 showed EF 25-30%, mild RV dysfunction, mild to moderate MR. -Continue Toprol-XL 50 mg daily -Continue losartan 25 mg daily, did not start Entresto due to hyperkalemia -Continue Jardiance 10 mg daily -Continue spironolactone 12.5 mg every other day.  -Check BMET, magnesium -Given LVEF remains less than 35% despite GDMT, and considering he is predominatly RV paced rhythm, will refer to EP to evaluate for CRT-D  CAD: LHC 05/02/2023 showed 50% proximal LAD, 99% mid LAD, 70% OM1 stenosis, status post DES to mid LAD. -Completed 1 month of triple therapy, now on Plavix plus Eliquis.  Is having a lot of  bruising.  Next month will be 6 months from his PCI, can switch to Eliquis plus aspirin at that point -Continue atorvastatin  Atrial flutter: Diagnosed 10/2022.  Underwent DCCV at that time.  Had recurrent atrial flutter and underwent repeat DCCV 07/15/23.  Has followed with EP to consider ablation, sees Dr. [name]. -Continue Eliquis -Continue Toprol-XL 50 mg daily  Heart block: Status post PPM.  Follows with EP  Frequent PVCs: continue Toprol-XL 50 mg daily  ASD: Small secundum ASD with left to right shunting noted on echocardiogram 10/2022.  No significant shunting on cath as above.  Small shunt (Qp/Qs 1.2) on CMR 05/2023  Hyperlipidemia: On atorvastatin 40 mg daily.  LDL 67 on 06/2023  RTC in 3 months  Medication Adjustments/Labs and Tests Ordered: Current medicines are reviewed at length with the patient today.  Concerns regarding medicines are outlined above.  Orders Placed This Encounter  Procedures   Basic metabolic panel with GFR   Magnesium   No orders of the defined types were placed in this encounter.   Patient Instructions  Medication Instructions:  IN ONE MONTH:  Stop Plavix Start Aspirin 81 mg daily *If you need a refill on your cardiac medications before your next appointment, please call your pharmacy*  Lab Work: BMP, Mag-  09/29/23 at office visit If you have labs (blood work) drawn today and your tests are completely normal, you will receive your results only by: MyChart Message (if you have MyChart) OR A paper copy in the mail If you have any lab test that is abnormal or we need to change your treatment,  we will call you to review the results.  Follow-Up: At [facility], you and your health needs are our priority.  As part of our continuing mission to provide you with exceptional heart care, our providers are all part of one team.  This team includes your primary Cardiologist (physician) and Advanced Practice Providers or APPs (Physician Assistants  and Nurse Practitioners) who all work together to provide you with the care you need, when you need it.  Your next appointment:    3 months 01/19/24 at 08:20  Provider:   Little Ishikawa, MD     We recommend signing up for the patient portal called "MyChart".  Sign up information is provided on this After Visit Summary.  MyChart is used to connect with patients for Virtual Visits (Telemedicine).  Patients are able to view lab/test results, encounter notes, upcoming appointments, etc.  Non-urgent messages can be sent to your provider as well.   To learn more about what you can do with MyChart, go to [email or web address].        1st Floor: - Lobby - Registration  - Pharmacy  - Lab - Cafe  2nd Floor: - PV Lab - Diagnostic Testing (echo, CT, nuclear med)  3rd Floor: - Vacant  4th Floor: - TCTS (cardiothoracic surgery) - AFib Clinic - Structural Heart Clinic - Vascular Surgery  - Vascular Ultrasound  5th Floor: - HeartCare Cardiology (general and EP) - Clinical Pharmacy for coumadin, hypertension, lipid, weight-loss medications, and med management appointments    Valet parking services will be available as well.       Signed, Little Ishikawa, MD  09/25/2023 5:21 PM    [facility]

## 2023-09-25 NOTE — Patient Instructions (Addendum)
 Medication Instructions:  IN ONE MONTH:  Stop Plavix Start Aspirin 81 mg daily *If you need a refill on your cardiac medications before your next appointment, please call your pharmacy*  Lab Work: BMP, Mag-  09/29/23 at office visit If you have labs (blood work) drawn today and your tests are completely normal, you will receive your results only by: MyChart Message (if you have MyChart) OR A paper copy in the mail If you have any lab test that is abnormal or we need to change your treatment, we will call you to review the results.  Follow-Up: At [facility], you and your health needs are our priority.  As part of our continuing mission to provide you with exceptional heart care, our providers are all part of one team.  This team includes your primary Cardiologist (physician) and Advanced Practice Providers or APPs (Physician Assistants and Nurse Practitioners) who all work together to provide you with the care you need, when you need it.  Your next appointment:    3 months 01/19/24 at 08:20  Provider:   Little Ishikawa, MD     We recommend signing up for the patient portal called "MyChart".  Sign up information is provided on this After Visit Summary.  MyChart is used to connect with patients for Virtual Visits (Telemedicine).  Patients are able to view lab/test results, encounter notes, upcoming appointments, etc.  Non-urgent messages can be sent to your provider as well.   To learn more about what you can do with MyChart, go to [email or web address].        1st Floor: - Lobby - Registration  - Pharmacy  - Lab - Cafe  2nd Floor: - PV Lab - Diagnostic Testing (echo, CT, nuclear med)  3rd Floor: - Vacant  4th Floor: - TCTS (cardiothoracic surgery) - AFib Clinic - Structural Heart Clinic - Vascular Surgery  - Vascular Ultrasound  5th Floor: - HeartCare Cardiology (general and EP) - Clinical Pharmacy for coumadin, hypertension, lipid, weight-loss  medications, and med management appointments    Valet parking services will be available as well.

## 2023-09-26 ENCOUNTER — Ambulatory Visit (INDEPENDENT_AMBULATORY_CARE_PROVIDER_SITE_OTHER): Payer: Medicare Other

## 2023-09-26 DIAGNOSIS — I459 Conduction disorder, unspecified: Secondary | ICD-10-CM

## 2023-09-27 LAB — CUP PACEART REMOTE DEVICE CHECK
Battery Remaining Longevity: 94 mo
Battery Remaining Percentage: 93 %
Battery Voltage: 2.99 V
Brady Statistic AP VP Percent: 63 %
Brady Statistic AP VS Percent: 2.7 %
Brady Statistic AS VP Percent: 27 %
Brady Statistic AS VS Percent: 1.7 %
Brady Statistic RA Percent Paced: 47 %
Brady Statistic RV Percent Paced: 90 %
Date Time Interrogation Session: 20250411040015
Implantable Lead Connection Status: 753985
Implantable Lead Connection Status: 753985
Implantable Lead Implant Date: 20240711
Implantable Lead Implant Date: 20240711
Implantable Lead Location: 753859
Implantable Lead Location: 753860
Implantable Pulse Generator Implant Date: 20240711
Lead Channel Impedance Value: 490 Ohm
Lead Channel Impedance Value: 490 Ohm
Lead Channel Pacing Threshold Amplitude: 0.625 V
Lead Channel Pacing Threshold Amplitude: 0.75 V
Lead Channel Pacing Threshold Pulse Width: 0.5 ms
Lead Channel Pacing Threshold Pulse Width: 0.5 ms
Lead Channel Sensing Intrinsic Amplitude: 5 mV
Lead Channel Sensing Intrinsic Amplitude: 7.7 mV
Lead Channel Setting Pacing Amplitude: 2 V
Lead Channel Setting Pacing Amplitude: 2.5 V
Lead Channel Setting Pacing Pulse Width: 0.5 ms
Lead Channel Setting Sensing Sensitivity: 0.5 mV
Pulse Gen Model: 2272
Pulse Gen Serial Number: 5829995

## 2023-09-28 NOTE — Progress Notes (Unsigned)
 Electrophysiology Office Note:    Date:  09/29/2023   ID:  Carlos Choi, DOB 1946/06/06, MRN [id number]  PCP:  Austine Lefort, MD   [facility] Providers Cardiologist:  Wendie Hamburg, MD Electrophysiologist:  Efraim Grange, MD     Referring MD: Austine Lefort, MD   History of Present Illness:    Carlos Choi is a 78 y.o. male with a medical history significant for smoking, bronchiectasis, hypothyroidism, and atrial flutter referred for bradycardia.     He was diagnosed with atrial flutter incidentally at the time of maxillary antrostomy on 10/16/2022.  He had slow ventricular rates, 35 to 70 bpm.  He was unaware of his arrhythmia.   Eliquis started and he underwent DC cardioversion.   ECG from May 30 shows Mobitz 1 AV block. He has been having significant fatigue. He is able to walk a mile and a half at a slow pace with his wife every morning. He has noticed intermittent ankle swelling over the past 1 months.  He underwent pacemaker placement on December 26, 2022. This was a complicated procedure. His left axillary vein was occluded, so we had to perform a right-sided implant.  Echocardiogram October 2024 showed an EF of 25 to 30%.  He underwent PCI of a 99% LAD stenosis in November 2024.  Repeat echocardiogram September 24, 2023 showed an EF of 25 to 30%     Today, he reports that he is doing Psychologist, clinical well.  He does not have any discomfort at the device site.  He does have significant fatigue and shortness of breath.    EKGs/Labs/Other Studies Reviewed Today:    Echocardiogram:  TTE 11/15/2022 EF 45-50%. Moderately dilated LA, severely dilated RA  TTE 09/24/2023 EF 25 to 30%   Monitors:   Stress testing:   Advanced imaging:  Cardiac MRI December 2024 ASD with small left-to-right shunt.  Moderate LV dilation.  EF 26%.  Septal dyskinesis consistent with RV pacing.  Subendocardial LGE in the mid to apical anteroseptum consistent with prior  infarct.  Cardiac catherization   EKG:   EKG Interpretation Date/Time:  Monday September 29 2023 15:07:33 EDT Ventricular Rate:  71 PR Interval:  186 QRS Duration:  176 QT Interval:  474 QTC Calculation: 515 R Axis:   -63  Text Interpretation: Atrial-sensed ventricular-paced rhythm with frequent AV dual-paced complexes Frequent PVCs When compared with ECG of 30-Jul-2023 15:18, No significant change was found Confirmed by Marlane Silver [phone]) on 09/29/2023 3:27:01 PM     Physical Exam:    VS:  BP 126/70   Pulse 71   Ht 6\' 4"  (1.93 m)   Wt 182 lb 12.8 oz (82.9 kg)   SpO2 98%   BMI 22.25 kg/m     Wt Readings from Last 3 Encounters:  09/29/23 182 lb 12.8 oz (82.9 kg)  09/25/23 181 lb (82.1 kg)  07/31/23 184 lb (83.5 kg)     GEN:  Well nourished, well developed in no acute distress CARDIAC: irregular rhythm, no murmurs, rubs, gallops The device site is normal -- no tenderness, edema, drainage, redness, threatened erosion.  RESPIRATORY:  Normal work of breathing MUSCULOSKELETAL: no edema    ASSESSMENT & PLAN:    Second degree AV block, complete heart block S/p pacemaker placement July, 2024 His device was interrogated today.  I reviewed the interrogation in detail.  See Paceart for report He is not device dependent today  Atrial flutter Typical atrial flutter on ECG 11/07/2022  No recurrence on device interrogation RA is severely dilated Continue apixaban 5 mg May need ablation in the future  Frequent PVCs May be contributing to fatigue and cardiomyopathy PVC burden 5% on interrogation today Continue metoprolol 50 mg daily  Secondary hypercoagulable state Continue apixaban 5 mg  Cardiomyopathy EF 25% I suspect this is due in large part to RV pacing I explained the indication, rationale, and logistics for LV lead placement.  Using a shared decision making approach, we opted to proceed with placement of an LV lead. Continue metoprolol XL 25, losartan 25  I  discussed the indication for the procedure and the logistics, risks, potential benefit, and after care. I specifically explained that risks include but are not limited to infection, bleeding,damage to blood vessels, lung, and the heart -- but risk of prolonged hospitalization, need for surgery, or the event of stroke, heart attack, or death are low but not zero.    Signed, Efraim Grange, MD  09/29/2023 3:27 PM    [facility]

## 2023-09-29 ENCOUNTER — Ambulatory Visit: Payer: Medicare Other | Attending: Cardiovascular Disease | Admitting: Cardiovascular Disease

## 2023-09-29 ENCOUNTER — Other Ambulatory Visit: Payer: Self-pay

## 2023-09-29 ENCOUNTER — Encounter: Payer: Self-pay | Admitting: Cardiovascular Disease

## 2023-09-29 VITALS — BP 126/70 | HR 71 | Ht 76.0 in | Wt 182.8 lb

## 2023-09-29 DIAGNOSIS — I5020 Unspecified systolic (congestive) heart failure: Secondary | ICD-10-CM | POA: Diagnosis not present

## 2023-09-29 DIAGNOSIS — I441 Atrioventricular block, second degree: Secondary | ICD-10-CM | POA: Diagnosis not present

## 2023-09-29 DIAGNOSIS — I10 Essential (primary) hypertension: Secondary | ICD-10-CM

## 2023-09-29 NOTE — Patient Instructions (Signed)
 Medication Instructions:  Your physician recommends that you continue on your current medications as directed. Please refer to the Current Medication list given to you today.  *If you need a refill on your cardiac medications before your next appointment, please call your pharmacy*  Lab Work: BMET and CBC - please have these drawn at any [facility] location within 30 days of your procedure  Testing/Procedures: Pacemaker upgrade  Follow-Up: At [facility], you and your health needs are our priority.  As part of our continuing mission to provide you with exceptional heart care, our providers are all part of one team.  This team includes your primary Cardiologist (physician) and Advanced Practice Providers or APPs (Physician Assistants and Nurse Practitioners) who all work together to provide you with the care you need, when you need it.  Your next appointment:   We will call you to schedule your post-procedure appointments.        1st Floor: - Lobby - Registration  - Pharmacy  - Lab - Cafe  2nd Floor: - PV Lab - Diagnostic Testing (echo, CT, nuclear med)  3rd Floor: - Vacant  4th Floor: - TCTS (cardiothoracic surgery) - AFib Clinic - Structural Heart Clinic - Vascular Surgery  - Vascular Ultrasound  5th Floor: - HeartCare Cardiology (general and EP) - Clinical Pharmacy for coumadin, hypertension, lipid, weight-loss medications, and med management appointments    Valet parking services will be available as well.

## 2023-10-03 ENCOUNTER — Telehealth: Payer: Self-pay | Admitting: Cardiovascular Disease

## 2023-10-03 NOTE — Telephone Encounter (Signed)
 Pt c/o medication issue:  1. Name of Medication: clopidogrel  (PLAVIX ) 75 MG tablet   2. How are you currently taking this medication (dosage and times per day)? On it til 5/16  3. Are you having a reaction (difficulty breathing--STAT)? No   4. What is your medication issue? Pt is having a procedure on 5/29 and was told by Dr Suzann Ernst to stop Plavix   and he is to start baby aspirin . Wife wanted to make us  aware of medication change

## 2023-10-03 NOTE — Telephone Encounter (Signed)
 fyi

## 2023-10-14 ENCOUNTER — Telehealth: Payer: Self-pay | Admitting: Cardiovascular Disease

## 2023-10-14 NOTE — Telephone Encounter (Signed)
I do not need this encounter 

## 2023-10-14 NOTE — Telephone Encounter (Signed)
 Called and spoke to patient and patient and spouse verbalized they need should aspirin  be held for BIV Pacemaker Generator change out with Dr. Arlester Ladd 5/29. Made patient aware that this will forward to provider for advice.  Patient verbalized an understanding.

## 2023-10-14 NOTE — Telephone Encounter (Signed)
 New Message:     Patient says he is scheduled  to have a  procedure  on 11-13-23 Patient says he have some questions that he needs to ask please

## 2023-10-15 NOTE — Telephone Encounter (Signed)
 Spoke with patient, instructed to hold for 2 days prior to gen change. Also, added aspirin  81 mg to medication list for future reference. No further needs at this time

## 2023-10-17 ENCOUNTER — Encounter (HOSPITAL_COMMUNITY): Payer: Self-pay

## 2023-10-17 ENCOUNTER — Telehealth (HOSPITAL_COMMUNITY): Payer: Self-pay

## 2023-10-17 NOTE — Telephone Encounter (Signed)
 Spoke with patient to complete pre-procedure call.     New medical conditions? No Recent hospitalizations or surgeries? No Started any new medications? No Patient made aware to contact office to inform of any new medications started. Any changes in activities of daily living? No  Pre-procedure testing scheduled: lab work ordered  Confirmed patient is scheduled for PPM generator change on Thursday, May 29 with Dr. Marlane Silver. Instructed patient to arrive at the Main Entrance A at [facility]: [address] [place], [place] [address] and check in at Admitting at 12:30 PM.  Advised of plan to go home the same day and will only stay overnight if medically necessary. You MUST have a responsible adult to drive you home and MUST be with you the first 24 hours after you arrive home or your procedure could be cancelled.  Patient verbalized understanding to information provided and is agreeable to proceed with procedure.

## 2023-10-31 DIAGNOSIS — I5020 Unspecified systolic (congestive) heart failure: Secondary | ICD-10-CM | POA: Diagnosis not present

## 2023-10-31 DIAGNOSIS — Z79899 Other long term (current) drug therapy: Secondary | ICD-10-CM | POA: Diagnosis not present

## 2023-10-31 DIAGNOSIS — I10 Essential (primary) hypertension: Secondary | ICD-10-CM | POA: Diagnosis not present

## 2023-10-31 DIAGNOSIS — I441 Atrioventricular block, second degree: Secondary | ICD-10-CM | POA: Diagnosis not present

## 2023-11-01 LAB — CBC WITH DIFFERENTIAL/PLATELET
Basophils Absolute: 0.1 10*3/uL (ref 0.0–0.2)
Basos: 1 %
EOS (ABSOLUTE): 0.1 10*3/uL (ref 0.0–0.4)
Eos: 1 %
Hematocrit: 51.5 % — ABNORMAL HIGH (ref 37.5–51.0)
Hemoglobin: 16.9 g/dL (ref 13.0–17.7)
Immature Grans (Abs): 0 10*3/uL (ref 0.0–0.1)
Immature Granulocytes: 0 %
Lymphocytes Absolute: 2 10*3/uL (ref 0.7–3.1)
Lymphs: 32 %
MCH: 31.6 pg (ref 26.6–33.0)
MCHC: 32.8 g/dL (ref 31.5–35.7)
MCV: 96 fL (ref 79–97)
Monocytes Absolute: 0.6 10*3/uL (ref 0.1–0.9)
Monocytes: 10 %
Neutrophils Absolute: 3.5 10*3/uL (ref 1.4–7.0)
Neutrophils: 56 %
Platelets: 141 10*3/uL — ABNORMAL LOW (ref 150–450)
RBC: 5.34 x10E6/uL (ref 4.14–5.80)
RDW: 13.7 % (ref 11.6–15.4)
WBC: 6.3 10*3/uL (ref 3.4–10.8)

## 2023-11-01 LAB — BASIC METABOLIC PANEL WITH GFR
BUN/Creatinine Ratio: 16 (ref 10–24)
BUN/Creatinine Ratio: 17 (ref 10–24)
BUN: 17 mg/dL (ref 8–27)
BUN: 18 mg/dL (ref 8–27)
CO2: 21 mmol/L (ref 20–29)
CO2: 21 mmol/L (ref 20–29)
Calcium: 9.3 mg/dL (ref 8.6–10.2)
Calcium: 9.4 mg/dL (ref 8.6–10.2)
Chloride: 107 mmol/L — ABNORMAL HIGH (ref 96–106)
Chloride: 108 mmol/L — ABNORMAL HIGH (ref 96–106)
Creatinine, Ser: 1.06 mg/dL (ref 0.76–1.27)
Creatinine, Ser: 1.09 mg/dL (ref 0.76–1.27)
Glucose: 94 mg/dL (ref 70–99)
Glucose: 95 mg/dL (ref 70–99)
Potassium: 4.4 mmol/L (ref 3.5–5.2)
Potassium: 4.4 mmol/L (ref 3.5–5.2)
Sodium: 145 mmol/L — ABNORMAL HIGH (ref 134–144)
Sodium: 146 mmol/L — ABNORMAL HIGH (ref 134–144)
eGFR: 70 mL/min/{1.73_m2} (ref >59)
eGFR: 73 mL/min/{1.73_m2} (ref >59)

## 2023-11-01 LAB — MAGNESIUM: Magnesium: 2.2 mg/dL (ref 1.6–2.3)

## 2023-11-02 ENCOUNTER — Ambulatory Visit: Payer: Self-pay | Admitting: Cardiology

## 2023-11-03 NOTE — Addendum Note (Signed)
 Addended by: Lott Rouleau A on: 11/03/2023 09:33 AM   Modules accepted: Orders

## 2023-11-03 NOTE — Progress Notes (Signed)
 Remote pacemaker transmission.

## 2023-11-06 ENCOUNTER — Telehealth (HOSPITAL_COMMUNITY): Payer: Self-pay

## 2023-11-06 NOTE — Telephone Encounter (Signed)
 Spoke with patient to discuss upcoming procedure.   Confirmed patient is scheduled for a PPM generator change on Thursday, May 29 with Dr. Marlane Silver. Instructed patient to arrive at the Main Entrance A at [facility]: [address] [place], [place] [address] and check in at Admitting at 12:30 PM.   Labs completed  Any recent signs of acute illness or been started on antibiotics? No Any new medications started? Stopped Plavix  and resumed ASA 81 mg per Dr. Alda Amas Any medications to hold? Hold Jardiance  for 3 days- last dose 5/25,  Eliquis  and ASA for 2 day- last dose 5/26, spironolactone  the morning of procedure. Medication instructions:  On the morning of your procedure you may take all other morning medications not discussed with a sip of water. You may have a LIGHT breakfast before 6:00 AM on the morning of your procedure. Do NOT eat or drink anything after 6:00 AM prior to procedure.   The night before your procedure and the morning of your procedure, wash thoroughly with the CHG surgical soap from the neck down, paying special attention to the area where your procedure will be performed.  Advised of plan to go home the same day and will only stay overnight if medically necessary. You MUST have a responsible adult to drive you home and MUST be with you the first 24 hours after you arrive home.  Patient verbalized understanding to all instructions provided and agreed to proceed with procedure.

## 2023-11-08 ENCOUNTER — Ambulatory Visit: Payer: Self-pay | Admitting: Cardiovascular Disease

## 2023-11-12 NOTE — Pre-Procedure Instructions (Signed)
 Attempted to call patient regarding procedure instructions.  Left voicemail on the following items: Arrival time 1200 Nothing to eat or drink after midnight No meds AM of procedure Responsible person to drive you home and stay with you for 24 hrs Wash with special soap night before and morning of procedure If on anti-coagulant drug instructions Plavix  & Eliquis  last dose 5/26

## 2023-11-13 ENCOUNTER — Ambulatory Visit (HOSPITAL_COMMUNITY)
Admission: RE | Admit: 2023-11-13 | Discharge: 2023-11-14 | Disposition: A | Discharge status: Home / Self Care | Source: Ambulatory Visit | Attending: Cardiovascular Disease | Admitting: Cardiovascular Disease

## 2023-11-13 ENCOUNTER — Ambulatory Visit (HOSPITAL_COMMUNITY)
Admission: RE | Disposition: A | Discharge status: Home / Self Care | Payer: Self-pay | Source: Ambulatory Visit | Attending: Cardiovascular Disease

## 2023-11-13 ENCOUNTER — Other Ambulatory Visit: Payer: Self-pay

## 2023-11-13 ENCOUNTER — Encounter: Payer: Self-pay | Admitting: Emergency Medicine

## 2023-11-13 DIAGNOSIS — Z79899 Other long term (current) drug therapy: Secondary | ICD-10-CM | POA: Diagnosis not present

## 2023-11-13 DIAGNOSIS — I2510 Atherosclerotic heart disease of native coronary artery without angina pectoris: Secondary | ICD-10-CM | POA: Insufficient documentation

## 2023-11-13 DIAGNOSIS — J479 Bronchiectasis, uncomplicated: Secondary | ICD-10-CM | POA: Diagnosis not present

## 2023-11-13 DIAGNOSIS — I442 Atrioventricular block, complete: Secondary | ICD-10-CM | POA: Diagnosis not present

## 2023-11-13 DIAGNOSIS — I429 Cardiomyopathy, unspecified: Secondary | ICD-10-CM | POA: Insufficient documentation

## 2023-11-13 DIAGNOSIS — Z45010 Encounter for checking and testing of cardiac pacemaker pulse generator [battery]: Secondary | ICD-10-CM | POA: Diagnosis not present

## 2023-11-13 DIAGNOSIS — I493 Ventricular premature depolarization: Secondary | ICD-10-CM | POA: Diagnosis not present

## 2023-11-13 DIAGNOSIS — F17200 Nicotine dependence, unspecified, uncomplicated: Secondary | ICD-10-CM | POA: Insufficient documentation

## 2023-11-13 DIAGNOSIS — E039 Hypothyroidism, unspecified: Secondary | ICD-10-CM | POA: Diagnosis not present

## 2023-11-13 DIAGNOSIS — I483 Typical atrial flutter: Secondary | ICD-10-CM | POA: Diagnosis not present

## 2023-11-13 DIAGNOSIS — Z950 Presence of cardiac pacemaker: Secondary | ICD-10-CM | POA: Diagnosis present

## 2023-11-13 DIAGNOSIS — Z7901 Long term (current) use of anticoagulants: Secondary | ICD-10-CM | POA: Insufficient documentation

## 2023-11-13 DIAGNOSIS — D6869 Other thrombophilia: Secondary | ICD-10-CM | POA: Diagnosis not present

## 2023-11-13 HISTORY — PX: BIV UPGRADE: EP1202

## 2023-11-13 HISTORY — PX: LEAD INSERTION: EP1212

## 2023-11-13 MED ORDER — MIDAZOLAM HCL 2 MG/2ML IJ SOLN
INTRAMUSCULAR | Status: AC
Start: 2023-11-13 — End: ?
  Filled 2023-11-13: qty 2

## 2023-11-13 MED ORDER — SODIUM CHLORIDE 0.9 % IV SOLN
INTRAVENOUS | Status: AC
  Filled 2023-11-13: qty 2

## 2023-11-13 MED ORDER — POVIDONE-IODINE 10 % EX SWAB
2.0000 | Freq: Once | CUTANEOUS | Status: AC
  Administered 2023-11-13: 2 via TOPICAL

## 2023-11-13 MED ORDER — ALPRAZOLAM 0.25 MG PO TABS: 0.2500 mg | ORAL_TABLET | Freq: Every day | ORAL | Status: DC | PRN

## 2023-11-13 MED ORDER — LIDOCAINE HCL (PF) 1 % IJ SOLN
INTRAMUSCULAR | Status: DC | PRN
  Administered 2023-11-13: 60 mL

## 2023-11-13 MED ORDER — SODIUM CHLORIDE 0.9% FLUSH: 3.0000 mL | INTRAVENOUS | Status: DC | PRN

## 2023-11-13 MED ORDER — LIDOCAINE HCL 1 % IJ SOLN
INTRAMUSCULAR | Status: AC
  Filled 2023-11-13: qty 60

## 2023-11-13 MED ORDER — ACETAMINOPHEN 325 MG PO TABS: 650.0000 mg | ORAL_TABLET | ORAL | Status: DC | PRN

## 2023-11-13 MED ORDER — TAMSULOSIN HCL 0.4 MG PO CAPS
0.4000 mg | ORAL_CAPSULE | Freq: Every day | ORAL | Status: DC
  Administered 2023-11-13: 0.4 mg via ORAL
  Filled 2023-11-13: qty 1

## 2023-11-13 MED ORDER — CEFAZOLIN SODIUM-DEXTROSE 2-4 GM/100ML-% IV SOLN
2.0000 g | INTRAVENOUS | Status: AC
  Administered 2023-11-13: 2 g via INTRAVENOUS

## 2023-11-13 MED ORDER — METOPROLOL SUCCINATE ER 50 MG PO TB24
50.0000 mg | ORAL_TABLET | Freq: Every day | ORAL | Status: DC
  Filled 2023-11-13: qty 1

## 2023-11-13 MED ORDER — ONDANSETRON HCL 4 MG/2ML IJ SOLN
4.0000 mg | Freq: Four times a day (QID) | INTRAMUSCULAR | Status: DC | PRN
Start: 2023-11-13 — End: 2023-11-13

## 2023-11-13 MED ORDER — ATORVASTATIN CALCIUM 40 MG PO TABS
40.0000 mg | ORAL_TABLET | Freq: Every day | ORAL | Status: DC
  Administered 2023-11-13 – 2023-11-14 (×2): 40 mg via ORAL
  Filled 2023-11-13 (×2): qty 1

## 2023-11-13 MED ORDER — ACETAMINOPHEN 325 MG PO TABS
325.0000 mg | ORAL_TABLET | ORAL | Status: DC | PRN
  Administered 2023-11-14: 650 mg via ORAL
  Filled 2023-11-13: qty 2

## 2023-11-13 MED ORDER — LEVOTHYROXINE SODIUM 100 MCG PO TABS
100.0000 ug | ORAL_TABLET | Freq: Every day | ORAL | Status: DC
  Administered 2023-11-14: 100 ug via ORAL
  Filled 2023-11-13: qty 1

## 2023-11-13 MED ORDER — EMPAGLIFLOZIN 10 MG PO TABS
10.0000 mg | ORAL_TABLET | Freq: Every day | ORAL | Status: DC
  Administered 2023-11-14: 10 mg via ORAL
  Filled 2023-11-13: qty 1

## 2023-11-13 MED ORDER — CEFAZOLIN SODIUM-DEXTROSE 2-4 GM/100ML-% IV SOLN
INTRAVENOUS | Status: AC
  Filled 2023-11-13: qty 100

## 2023-11-13 MED ORDER — SODIUM CHLORIDE 0.9 % IV SOLN
250.0000 mL | INTRAVENOUS | Status: DC | PRN
Start: 2023-11-13 — End: 2023-11-14

## 2023-11-13 MED ORDER — LOSARTAN POTASSIUM 25 MG PO TABS
25.0000 mg | ORAL_TABLET | Freq: Every day | ORAL | Status: DC
  Administered 2023-11-14: 25 mg via ORAL
  Filled 2023-11-13: qty 1

## 2023-11-13 MED ORDER — LORATADINE 10 MG PO TABS
10.0000 mg | ORAL_TABLET | Freq: Every day | ORAL | Status: DC
  Administered 2023-11-14: 10 mg via ORAL
  Filled 2023-11-13: qty 1

## 2023-11-13 MED ORDER — SODIUM CHLORIDE 0.9 % IV SOLN: INTRAVENOUS | Status: DC

## 2023-11-13 MED ORDER — PANTOPRAZOLE SODIUM 40 MG PO TBEC
40.0000 mg | DELAYED_RELEASE_TABLET | Freq: Every day | ORAL | Status: DC
  Administered 2023-11-14: 40 mg via ORAL
  Filled 2023-11-13: qty 1

## 2023-11-13 MED ORDER — HEPARIN (PORCINE) IN NACL 1000-0.9 UT/500ML-% IV SOLN
INTRAVENOUS | Status: DC | PRN
Start: 2023-11-13 — End: 2023-11-13
  Administered 2023-11-13: 500 mL

## 2023-11-13 MED ORDER — FENTANYL CITRATE (PF) 100 MCG/2ML IJ SOLN
INTRAMUSCULAR | Status: AC
  Filled 2023-11-13: qty 2

## 2023-11-13 MED ORDER — SODIUM CHLORIDE 0.9% FLUSH
3.0000 mL | Freq: Two times a day (BID) | INTRAVENOUS | Status: DC
  Administered 2023-11-13 – 2023-11-14 (×2): 3 mL via INTRAVENOUS

## 2023-11-13 MED ORDER — CEFAZOLIN SODIUM-DEXTROSE 1-4 GM/50ML-% IV SOLN
1.0000 g | Freq: Four times a day (QID) | INTRAVENOUS | Status: AC
  Administered 2023-11-13 – 2023-11-14 (×3): 1 g via INTRAVENOUS
  Filled 2023-11-13 (×4): qty 50

## 2023-11-13 MED ORDER — CHLORHEXIDINE GLUCONATE 4 % EX SOLN
4.0000 | Freq: Once | CUTANEOUS | Status: DC
  Filled 2023-11-13: qty 60

## 2023-11-13 MED ORDER — IOHEXOL 350 MG/ML SOLN
INTRAVENOUS | Status: DC | PRN
  Administered 2023-11-13: 15 mL

## 2023-11-13 MED ORDER — ONDANSETRON HCL 4 MG/2ML IJ SOLN: 4.0000 mg | Freq: Four times a day (QID) | INTRAMUSCULAR | Status: DC | PRN

## 2023-11-13 MED ORDER — FENTANYL CITRATE (PF) 100 MCG/2ML IJ SOLN
INTRAMUSCULAR | Status: DC | PRN
  Administered 2023-11-13 (×2): 25 ug via INTRAVENOUS

## 2023-11-13 MED ORDER — MIDAZOLAM HCL 5 MG/5ML IJ SOLN
INTRAMUSCULAR | Status: DC | PRN
Start: 2023-11-13 — End: 2023-11-13
  Administered 2023-11-13 (×2): 1 mg via INTRAVENOUS

## 2023-11-13 MED ORDER — SODIUM CHLORIDE 0.9 % IV SOLN
80.0000 mg | INTRAVENOUS | Status: AC
  Administered 2023-11-13: 80 mg

## 2023-11-13 NOTE — H&P (Signed)
 Electrophysiology Office Note:    Date:  11/13/2023   ID:  CINDY BRINDISI, DOB 01-15-1946, MRN [id number]  PCP:  Austine Lefort, MD   [facility] Providers Cardiologist:  Wendie Hamburg, MD Electrophysiologist:  Efraim Grange, MD     Referring MD: No ref. provider found   History of Present Illness:    Carlos Choi is a 78 y.o. male with a medical history significant for smoking, bronchiectasis, hypothyroidism, and atrial flutter referred for bradycardia.     He was diagnosed with atrial flutter incidentally at the time of maxillary antrostomy on 10/16/2022.  He had slow ventricular rates, 35 to 70 bpm.  He was unaware of his arrhythmia.   Eliquis  started and he underwent DC cardioversion.   ECG from May 30 shows Mobitz 1 AV block. He has been having significant fatigue. He is able to walk a mile and a half at a slow pace with his wife every morning. He has noticed intermittent ankle swelling over the past 1 months.  He underwent pacemaker placement on December 26, 2022. This was a complicated procedure. His left axillary vein was occluded, so we had to perform a right-sided implant.  Echocardiogram October 2024 showed an EF of 25 to 30%.  He underwent PCI of a 99% LAD stenosis in November 2024.  Repeat echocardiogram September 24, 2023 showed an EF of 25 to 30%     Today, he reports that he is doing Psychologist, clinical well.  He does not have any discomfort at the device site.  He does have significant fatigue and shortness of breath. He states that he has not had any changes in diagnoses, medication, or condition since his last visit with me.    EKGs/Labs/Other Studies Reviewed Today:    Echocardiogram:  TTE 11/15/2022 EF 45-50%. Moderately dilated LA, severely dilated RA  TTE 09/24/2023 EF 25 to 30%   Monitors:   Stress testing:   Advanced imaging:  Cardiac MRI December 2024 ASD with small left-to-right shunt.  Moderate LV dilation.  EF 26%.  Septal  dyskinesis consistent with RV pacing.  Subendocardial LGE in the mid to apical anteroseptum consistent with prior infarct.  Cardiac catherization   EKG:         Physical Exam:    VS:  There were no vitals taken for this visit.    Wt Readings from Last 3 Encounters:  09/29/23 82.9 kg  09/25/23 82.1 kg  07/31/23 83.5 kg     GEN:  Well nourished, well developed in no acute distress CARDIAC: irregular rhythm, no murmurs, rubs, gallops The device site is normal -- no tenderness, edema, drainage, redness, threatened erosion.  RESPIRATORY:  Normal work of breathing MUSCULOSKELETAL: no edema    ASSESSMENT & PLAN:    Second degree AV block, complete heart block S/p pacemaker placement July, 2024 His device was interrogated today.  I reviewed the interrogation in detail.  See Paceart for report He is not device dependent today  Atrial flutter Typical atrial flutter on ECG 11/07/2022 No recurrence on device interrogation RA is severely dilated Continue apixaban  5 mg May need ablation in the future  Frequent PVCs May be contributing to fatigue and cardiomyopathy PVC burden 5% on interrogation today Continue metoprolol  50 mg daily  Secondary hypercoagulable state Continue apixaban  5 mg  Cardiomyopathy EF 25% I suspect this is due in large part to RV pacing I explained the indication, rationale, and logistics for LV lead placement.  Using a shared  decision making approach, we opted to proceed with placement of an LV lead. Continue metoprolol  XL 25, losartan  25  I discussed the indication for the procedure and the logistics, risks, potential benefit, and after care. I specifically explained that risks include but are not limited to infection, bleeding,damage to blood vessels, lung, and the heart -- but risk of prolonged hospitalization, need for surgery, or the event of stroke, heart attack, or death are low but not zero.    Signed, Efraim Grange, MD  11/13/2023 12:55  PM    [facility]

## 2023-11-14 ENCOUNTER — Ambulatory Visit (HOSPITAL_COMMUNITY)

## 2023-11-14 ENCOUNTER — Encounter (HOSPITAL_COMMUNITY): Payer: Self-pay | Admitting: Cardiovascular Disease

## 2023-11-14 ENCOUNTER — Other Ambulatory Visit (HOSPITAL_COMMUNITY): Payer: Self-pay

## 2023-11-14 DIAGNOSIS — F17200 Nicotine dependence, unspecified, uncomplicated: Secondary | ICD-10-CM | POA: Diagnosis not present

## 2023-11-14 DIAGNOSIS — I429 Cardiomyopathy, unspecified: Secondary | ICD-10-CM | POA: Diagnosis not present

## 2023-11-14 DIAGNOSIS — Z79899 Other long term (current) drug therapy: Secondary | ICD-10-CM | POA: Diagnosis not present

## 2023-11-14 DIAGNOSIS — I493 Ventricular premature depolarization: Secondary | ICD-10-CM | POA: Diagnosis not present

## 2023-11-14 DIAGNOSIS — I2510 Atherosclerotic heart disease of native coronary artery without angina pectoris: Secondary | ICD-10-CM | POA: Diagnosis not present

## 2023-11-14 DIAGNOSIS — D6869 Other thrombophilia: Secondary | ICD-10-CM | POA: Diagnosis not present

## 2023-11-14 DIAGNOSIS — I442 Atrioventricular block, complete: Secondary | ICD-10-CM | POA: Diagnosis not present

## 2023-11-14 DIAGNOSIS — E039 Hypothyroidism, unspecified: Secondary | ICD-10-CM | POA: Diagnosis not present

## 2023-11-14 DIAGNOSIS — I483 Typical atrial flutter: Secondary | ICD-10-CM | POA: Diagnosis not present

## 2023-11-14 DIAGNOSIS — Z7901 Long term (current) use of anticoagulants: Secondary | ICD-10-CM | POA: Diagnosis not present

## 2023-11-14 DIAGNOSIS — Z950 Presence of cardiac pacemaker: Secondary | ICD-10-CM | POA: Diagnosis not present

## 2023-11-14 DIAGNOSIS — J479 Bronchiectasis, uncomplicated: Secondary | ICD-10-CM | POA: Diagnosis not present

## 2023-11-14 DIAGNOSIS — Z45010 Encounter for checking and testing of cardiac pacemaker pulse generator [battery]: Secondary | ICD-10-CM | POA: Diagnosis not present

## 2023-11-14 MED ORDER — MEXILETINE HCL 200 MG PO CAPS
200.0000 mg | ORAL_CAPSULE | Freq: Two times a day (BID) | ORAL | Status: DC
  Administered 2023-11-14: 200 mg via ORAL
  Filled 2023-11-14 (×2): qty 1

## 2023-11-14 MED ORDER — MEXILETINE HCL 200 MG PO CAPS
200.0000 mg | ORAL_CAPSULE | Freq: Two times a day (BID) | ORAL | 6 refills | Status: DC
  Filled 2023-11-14: qty 60, 30d supply, fill #0

## 2023-11-14 MED FILL — Midazolam HCl Inj 2 MG/2ML (Base Equivalent): INTRAMUSCULAR | Qty: 2 | Status: AC

## 2023-11-14 NOTE — Progress Notes (Signed)
 Discharge instructions reviewed with pt and his wife by RN Pam.  Copy of instructions given to pt. [facility] has filled pt's script and will be picked up on the way out for discharge. Pt has an IV antibiotic to complete and will then be discharged. Pt's nurse will remove his IV and telemetry when antibiotic is complete and will discharge pt out with his wife.   Wesson Stith,RN SWOT

## 2023-11-14 NOTE — Discharge Summary (Signed)
 ELECTROPHYSIOLOGY PROCEDURE DISCHARGE SUMMARY    Patient ID: Carlos Choi,  MRN: [id number], DOB/AGE: July 12, 1945 78 y.o.  Admit date: 11/13/2023 Discharge date: 11/14/2023  Primary Care Physician: Austine Lefort, MD  Primary Cardiologist: Wendie Hamburg, MD  Electrophysiologist: Dr. Arlester Ladd   Primary Discharge Diagnosis:  Symptomatic bradycardia status post pacemaker implantation this admission  Secondary Discharge Diagnosis:  Frequent PVC's  Typical Atrial Flutter  Tobacco Abuse  Bronchiectasis  Hypothyroidism   No Known Allergies   Procedures This Admission:  1.  Implantation of a Abbott CRT PPM on 11/13/23 by Dr. Arlester Ladd. The patient had existing Abbott Assurity dual chamber PPM implanted 12/26/22 (right sided due to occluded left).  ECHO in 03/2023 showed an LVEF 25-30% and he underwent PCI of a 99% LAD stenosis in November 2024. Repeat echocardiogram September 24, 2023 showed an EF of 25 to 30%. An Abbott Quartet [phone] left ventricular lead was placed this admission.  There were no immediate post procedure complications.   2.  CXR on 11/14/2023 demonstrated no pneumothorax status post device implantation.       Brief HPI: Carlos Choi is a 78 y.o. male was referred to electrophysiology in the outpatient setting for consideration of PPM implantation.  Past medical history includes symptomatic bradycardia due to 2nd degree AVB, atrial flutter, tobacco abuse, bronchiectasis, hypothyroidism.  The patient has had symptomatic bradycardia without reversible causes identified.  Risks, benefits, and alternatives to PPM implantation were reviewed with the patient who wished to proceed.   Hospital Course:  The patient was admitted and underwent implantation of a Abbott CRT-P (CS lead upgrade only this admission) with details as outlined above.  He was monitored on telemetry overnight which demonstrated appropriate pacing.  Left chest was without hematoma or ecchymosis.   The device was interrogated and found to be functioning normally.  CXR was obtained and demonstrated no pneumothorax status post device implantation.  Wound care, arm mobility, and restrictions were reviewed with the patient.  The patient was examined and considered stable for discharge to home.    Anticoagulation resumption This patient should resume their Eliquis  on Tuesday November 18, 2023    Physical Exam: Vitals:   11/14/23 0007 11/14/23 0447 11/14/23 0748 11/14/23 0851  BP: 108/72 123/60 132/60 (!) 111/48  Pulse: 71 71 65 75  Resp: 18 18 18    Temp: 97.7 F (36.5 C) (!) 97.5 F (36.4 C) 97.6 F (36.4 C)   TempSrc: Oral Oral Oral   SpO2: 95% 95% 93%   Weight:  80.5 kg    Height:        GEN- pleasant adult male in NAD. A&O x 3.  HEENT: Normocephalic, atraumatic Lungs- CTAB, Normal effort.  Heart- RRR, No M/G/R.  GI- Soft, NT, ND.  Extremities- No clubbing, cyanosis, or edema;  Skin- warm and dry, no rash or lesion, left chest without hematoma/ecchymosis, steri strips clean / dry / intact  Discharge Medications:  Allergies as of 11/14/2023   No Known Allergies      Medication List     TAKE these medications    acetaminophen  650 MG CR tablet Commonly known as: TYLENOL  Take 1,300 mg by mouth every 8 (eight) hours as needed for pain.   Alka-Seltzer Gold [id number] MG Tbef Generic drug: Sod Bicarb-K Bicarb-Citric Acd Take 2 tablets by mouth daily as needed (upset stomach/indigestion.).   ALPRAZolam  0.25 MG tablet Commonly known as: XANAX  Take 1 tablet (0.25 mg total) by mouth daily as needed  for anxiety.   aspirin  EC 81 MG tablet Take 81 mg by mouth daily. Swallow whole.   atorvastatin  40 MG tablet Commonly known as: LIPITOR Take 1 tablet (40 mg total) by mouth daily. What changed: when to take this   CENTRUM SILVER PO Take 1 tablet by mouth daily with lunch.   CULTURELLE DIGESTIVE HEALTH PO Take 1 capsule by mouth daily with lunch.   Eliquis  5 MG Tabs  tablet Generic drug: apixaban  TAKE ONE TABLET BY MOUTH 2 TIMES A DAY Notes to patient: Hold post procedure, resume June 3   empagliflozin  10 MG Tabs tablet Commonly known as: Jardiance  Take 1 tablet (10 mg total) by mouth daily before breakfast.   Fish Oil 1000 MG Caps Take 2,000 mg by mouth daily with lunch.   ipratropium 0.03 % nasal spray Commonly known as: ATROVENT Place 2 sprays into both nostrils daily as needed for rhinitis.   levothyroxine  100 MCG tablet Commonly known as: SYNTHROID  Take 1 tablet (100 mcg total) by mouth daily.   loratadine 10 MG tablet Commonly known as: CLARITIN Take 10 mg by mouth daily with breakfast.   losartan  25 MG tablet Commonly known as: COZAAR  Take 1 tablet (25 mg total) by mouth daily.   metoprolol  succinate 50 MG 24 hr tablet Commonly known as: TOPROL -XL Take 1 tablet (50 mg total) by mouth daily. Take with or immediately following a meal.   mexiletine 200 MG capsule Commonly known as: MEXITIL Take 1 capsule (200 mg total) by mouth 2 (two) times daily.   Mucinex Maximum Strength 1200 MG Tb12 Generic drug: Guaifenesin Take 1,200 mg by mouth 2 (two) times daily as needed (Congestion).   NETI POT SINUS WASH NA Place 1 Dose into the nose 2 (two) times daily.   nitroGLYCERIN  0.4 MG SL tablet Commonly known as: NITROSTAT  Place 1 tablet (0.4 mg total) under the tongue every 5 (five) minutes as needed for chest pain.   pantoprazole  40 MG tablet Commonly known as: Protonix  Take 1 tablet (40 mg total) by mouth daily.   silodosin 8 MG Caps capsule Commonly known as: RAPAFLO Take 8 mg by mouth daily with supper.   spironolactone  25 MG tablet Commonly known as: ALDACTONE  Take 0.5 tablets (12.5 mg total) by mouth every other day.        Disposition:  Home with usual follow up as in AVS   Duration of Discharge Encounter:  APP time: 32 minutes  Signed, Creighton Doffing, NP-C, AGACNP-BC [facility] - Electrophysiology   11/14/2023, 10:54 AM

## 2023-11-14 NOTE — TOC Transition Note (Signed)
 Transition of Care [facility]) - Discharge Note   Patient Details  Name: Carlos Choi MRN: [id number] Date of Birth: 1945-09-05  [facility]) CM/SW Contact:  Jonathan Neighbor, RN Phone Number: 11/14/2023, 11:34 AM   Clinical Narrative:     Pt is discharging home with self care. No needs per TOC.  Final next level of care: Home/Self Care Barriers to Discharge: No Barriers Identified   Patient Goals and CMS Choice            Discharge Placement                       Discharge Plan and Services Additional resources added to the After Visit Summary for                                       Social Drivers of Health (SDOH) Interventions SDOH Screenings   Food Insecurity: No Food Insecurity (11/13/2023)  Housing: Low Risk  (11/13/2023)  Transportation Needs: No Transportation Needs (11/13/2023)  Utilities: Not At Risk (11/13/2023)  Alcohol Screen: Low Risk  (03/04/2023)  Depression (PHQ2-9): Low Risk  (03/06/2023)  Recent Concern: Depression (PHQ2-9) - Medium Risk (12/20/2022)  Financial Resource Strain: Low Risk  (03/04/2023)  Physical Activity: Sufficiently Active (03/04/2023)  Social Connections: Socially Integrated (11/13/2023)  Stress: No Stress Concern Present (03/04/2023)  Tobacco Use: Medium Risk (09/29/2023)  Health Literacy: Adequate Health Literacy (03/06/2023)     Readmission Risk Interventions     No data to display

## 2023-11-14 NOTE — Discharge Instructions (Signed)
 Care After Your Ablation and Pacemaker   You have a Abbott Pacemaker  ACTIVITY Do not lift your arm above shoulder height for 1 week after your procedure. After 7 days, you may progress as below.  You should remove your sling 24 hours after your procedure, unless otherwise instructed by your provider.     Friday November 21, 2023  Saturday November 22, 2023 Sunday November 23, 2023 Monday November 24, 2023   Do not lift, push, pull, or carry anything over 10 pounds with the affected arm until 6 weeks (Friday December 26, 2023 ) after your procedure.   You may drive AFTER your wound check, unless you have been told otherwise by your provider.   Ask your healthcare provider when you can go back to work   INCISION/Dressing Hold your blood thinner post procedure, ok to resume Eliquis  on November 18, 2023   Monitor your Pacemaker site for redness, swelling, and drainage. Call the device clinic at [phone] if you experience these symptoms or fever/chills.  If your incision is sealed with Steri-strips or staples, you may shower 7 days after your procedure or when told by your provider. Do not remove the steri-strips or let the shower hit directly on your site. You may wash around your site with soap and water.    If you were discharged in a sling, please do not wear this during the day more than 48 hours after your surgery unless otherwise instructed. This may increase the risk of stiffness and soreness in your shoulder.   Avoid lotions, ointments, or perfumes over your incision until it is well-healed.  You may use a hot tub or a pool AFTER your wound check appointment if the incision is completely closed.  Pacemaker Alerts:  Some alerts are vibratory and others beep. These are NOT emergencies. Please call our office to let us  know. If this occurs at night or on weekends, it can wait until the next business day. Send a remote transmission.  If your device is capable of reading fluid status (for heart failure),  you will be offered monthly monitoring to review this with you.   DEVICE MANAGEMENT Remote monitoring is used to monitor your pacemaker from home. This monitoring is scheduled every 91 days by our office. It allows us  to keep an eye on the functioning of your device to ensure it is working properly. You will routinely see your Electrophysiologist annually (more often if necessary).   You should receive your ID card for your new device in 4-8 weeks. Keep this card with you at all times once received. Consider wearing a medical alert bracelet or necklace.  Your Pacemaker may be MRI compatible. This will be discussed at your next office visit/wound check.  You should avoid contact with strong electric or magnetic fields.   Do not use amateur (ham) radio equipment or electric (arc) welding torches. MP3 player headphones with magnets should not be used. Some devices are safe to use if held at least 12 inches (30 cm) from your Pacemaker. These include power tools, lawn mowers, and speakers. If you are unsure if something is safe to use, ask your health care provider.  When using your cell phone, hold it to the ear that is on the opposite side from the Pacemaker. Do not leave your cell phone in a pocket over the Pacemaker.  You may safely use electric blankets, heating pads, computers, and microwave ovens.  What can I expect after the procedure? After the procedure,  it is common to have: Bruising around your puncture site. Tenderness around your puncture site. Skipped heartbeats. Tiredness (fatigue).  Contact a health care provider if: You have redness, mild swelling, or pain around your puncture site. You have fluid or blood coming from your puncture site that stops after applying firm pressure to the area. Your puncture site feels warm to the touch. You have pus or a bad smell coming from your puncture site. You have a fever. You have chest pain or discomfort that spreads to your neck, jaw, or  arm. You are sweating a lot. You feel nauseous. You have a fast or irregular heartbeat. You have shortness of breath. You are dizzy or light-headed and feel the need to lie down. You have pain or numbness in the arm or leg closest to your puncture site.  Call the office right away if: You have chest pain. You feel more short of breath than you have felt before. You feel more light-headed than you have felt before. Your incision starts to open up. Your puncture site suddenly swells. Your puncture site is bleeding and the bleeding does not stop after applying firm pressure to the area. These symptoms may represent a serious problem that is an emergency. Do not wait to see if the symptoms will go away. Get medical help right away. Call your local emergency services (911 in the [place] Do not drive yourself to the hospital.  Puncture (groin) site care  Follow instructions from your health care provider about how to take care of your puncture site. Make sure you: If present, leave stitches (sutures), skin glue, or adhesive strips in place. These skin closures may need to stay in place for up to 2 weeks. If adhesive strip edges start to loosen and curl up, you may trim the loose edges. Do not remove adhesive strips completely unless your health care provider tells you to do that. If a large square bandage is present on your groin site, this may be removed 24 hours after surgery.  Check your puncture site every day for signs of infection. Check for: Redness, swelling, or pain. Fluid or blood. If your puncture site starts to bleed, lie down on your back, apply firm pressure to the area, and contact your health care provider. Warmth. Pus or a bad smell. A pea or small marble sized lump at the site is normal and can take up to three months to resolve.  Driving As above, you may drive after your wound check, unless otherwise directed by your provider.  Do not drive or use heavy machinery while  taking prescription pain medicine. Activity Avoid activities that take a lot of effort for at least 7 days after your procedure. Do not lift anything that is heavier than 5 lb (4.5 kg) for one week. After that, follow restrictions for your device as above.  No sexual activity for 1 week.  Return to your normal activities as told by your health care provider. Ask your health care provider what activities are safe for you. General instructions Take over-the-counter and prescription medicines only as told by your health care provider. Do not use any products that contain nicotine or tobacco, such as cigarettes and e-cigarettes. If you need help quitting, ask your health care provider. Do not drink alcohol for 24 hours after your procedure. Keep all follow-up visits as told by your health care provider. This is important.  This information is not intended to replace advice given to you by your health  care provider. Make sure you discuss any questions you have with your health care provider.

## 2023-11-17 ENCOUNTER — Telehealth: Payer: Self-pay

## 2023-11-17 NOTE — Telephone Encounter (Signed)
 Copied from CRM [id number]. Topic: General - Call Back - No Documentation >> Nov 17, 2023 12:33 PM Ameerah G wrote: Reason for CRM: patient would like a callback about his up coming appointments for pace maker, he's confused about why he needs both appointments and would like more explanation/clarification. [phone]

## 2023-11-17 NOTE — Telephone Encounter (Signed)
 Outreach made to Carlos Choi.  Carlos Choi is doing well.  Understands to restart Eliquis  on November 18, 2023.  Wound site appears to be WNL per Carlos Choi/wife.   All questions answered.    Follow-up after discharge: Implant date: 11/13/2023 MD: Dr. Arlester Ladd Device: Abbott BIV PPM Location: left upper chest   Wound check visit: 11/27/2023 at 11:20 am 90 day MD follow-up: scheduled  Remote Transmission received: Yes-requested Carlos Choi send transmission.  Transmission was received  Dressing/sling removed: yes  Confirm OAC restart on: November 18, 2023  Please continue to monitor your cardiac device site for redness, swelling, and drainage. Call the device clinic at [phone] if you experience these symptoms, fever/chills, or have questions about your device.   Remote monitoring is used to monitor your cardiac device from home. This monitoring is scheduled every 91 days by our office. It allows us  to keep an eye on the functioning of your device to ensure it is working properly.

## 2023-11-25 ENCOUNTER — Encounter: Payer: Self-pay | Admitting: Family Medicine

## 2023-11-25 ENCOUNTER — Telehealth: Payer: Self-pay | Admitting: Cardiology

## 2023-11-25 ENCOUNTER — Ambulatory Visit: Admitting: Family Medicine

## 2023-11-25 ENCOUNTER — Other Ambulatory Visit (HOSPITAL_COMMUNITY): Payer: Self-pay

## 2023-11-25 VITALS — BP 125/79 | HR 81 | Ht 76.0 in | Wt 177.6 lb

## 2023-11-25 DIAGNOSIS — E039 Hypothyroidism, unspecified: Secondary | ICD-10-CM | POA: Diagnosis not present

## 2023-11-25 DIAGNOSIS — I5042 Chronic combined systolic (congestive) and diastolic (congestive) heart failure: Secondary | ICD-10-CM

## 2023-11-25 DIAGNOSIS — I5022 Chronic systolic (congestive) heart failure: Secondary | ICD-10-CM | POA: Diagnosis not present

## 2023-11-25 DIAGNOSIS — K589 Irritable bowel syndrome without diarrhea: Secondary | ICD-10-CM

## 2023-11-25 MED ORDER — ALPRAZOLAM 0.25 MG PO TABS: 0.2500 mg | ORAL_TABLET | Freq: Every day | ORAL | 0 refills | Status: AC | PRN

## 2023-11-25 MED ORDER — SPIRONOLACTONE 25 MG PO TABS: 12.5000 mg | ORAL_TABLET | ORAL | 2 refills | Status: AC

## 2023-11-25 NOTE — Telephone Encounter (Signed)
*  STAT* If patient is at the pharmacy, call can be transferred to refill team.   1. Which medications need to be refilled? (please list name of each medication and dose if known)   spironolactone  (ALDACTONE ) 25 MG tablet (Expired)    2. Which pharmacy/location (including street and city if local pharmacy) is medication to be sent to?  [facility] - [place], [place] - [address]    3. Do they need a 30 day or 90 day supply? 90

## 2023-11-25 NOTE — Telephone Encounter (Signed)
 Pt's medication was sent to pt's pharmacy as requested. Confirmation received.

## 2023-11-25 NOTE — Progress Notes (Signed)
 Subjective:    Patient ID: Carlos Choi, male    DOB: 05-21-1946, 78 y.o.   MRN: [id number]  HPI Admit date: 11/13/2023 Discharge date: 11/14/2023   Primary Care Physician: Austine Lefort, MD  Primary Cardiologist: Wendie Hamburg, MD  Electrophysiologist: Dr. Arlester Ladd    Primary Discharge Diagnosis:  Symptomatic bradycardia status post pacemaker implantation this admission   Secondary Discharge Diagnosis:  Frequent PVC's  Typical Atrial Flutter  Tobacco Abuse  Bronchiectasis  Hypothyroidism    Allergies  No Known Allergies       Procedures This Admission:  1.  Implantation of a Abbott CRT PPM on 11/13/23 by Dr. Arlester Ladd. The patient had existing Abbott Assurity dual chamber PPM implanted 12/26/22 (right sided due to occluded left).  ECHO in 03/2023 showed an LVEF 25-30% and he underwent PCI of a 99% LAD stenosis in November 2024. Repeat echocardiogram September 24, 2023 showed an EF of 25 to 30%. An Abbott Quartet [phone] left ventricular lead was placed this admission.  There were no immediate post procedure complications.    2.  CXR on 11/14/2023 demonstrated no pneumothorax status post device implantation.         Brief HPI: Carlos Choi is a 78 y.o. male was referred to electrophysiology in the outpatient setting for consideration of PPM implantation.  Past medical history includes symptomatic bradycardia due to 2nd degree AVB, atrial flutter, tobacco abuse, bronchiectasis, hypothyroidism.  The patient has had symptomatic bradycardia without reversible causes identified.  Risks, benefits, and alternatives to PPM implantation were reviewed with the patient who wished to proceed.    Hospital Course:  The patient was admitted and underwent implantation of a Abbott CRT-P (CS lead upgrade only this admission) with details as outlined above.  He was monitored on telemetry overnight which demonstrated appropriate pacing.  Left chest was without hematoma or ecchymosis.  The  device was interrogated and found to be functioning normally.  CXR was obtained and demonstrated no pneumothorax status post device implantation.  Wound care, arm mobility, and restrictions were reviewed with the patient.  The patient was examined and considered stable for discharge to home.     Anticoagulation resumption This patient should resume their Eliquis  on Tuesday November 18, 2023    11/25/23 Patient is here today for follow-up.  Since discharge from the hospital, the patient states that he has been started on spironolactone .  Patient states that he is tolerating the medication without difficulty.  He denies any chest pain.  He denies any shortness of breath but he does report feeling tired and easy fatigability.  He denies any syncope or near syncope.  He denies any severe palpitations or lightheadedness.  He denies any pleurisy or hemoptysis.      Past Medical History:  Diagnosis Date   Allergy    Anxiety    Arthritis    mild ankles    ASD (atrial septal defect)    ASD (small secundum ASD with predominantly left-to-right shunting) 11/15/22 TTE   Atrial flutter (HCC)    s/p DCCV 11/04/22   Bronchiectasis (HCC)    Cataract    removed left eye, forming right eye    Clotting disorder (HCC)    Bruise easily & bleeding from cuts normally stops quickly   Diverticulosis    Dysrhythmia    2nd degree AV block, bradycardia s/p PPM 12/26/22; PVCs   GERD (gastroesophageal reflux disease)    Hemorrhoids    History of kidney stones  Hypertension    Hypothyroid    Presence of permanent cardiac pacemaker 12/26/2022   St. Jude/Abbott   Rectal bleeding    due to hemorrhoid    Tubular adenoma of colon 2015   Past Surgical History:  Procedure Laterality Date   BIV UPGRADE N/A 11/13/2023   Procedure: BIV UPGRADE;  Surgeon: Mealor, Donnamae Gaba, MD;  Location: [facility] IN[facility]ASIVE CV [facility];  Service: Cardiovascular;  Laterality: N/A;   CARDIOVERSION N/A 11/14/2022   Procedure: CARDIOVERSION;  Surgeon:  Hugh Madura, MD;  Location: [facility];  Service: Cardiovascular;  Laterality: N/A;   CARDIOVERSION N/A 07/15/2023   Procedure: CARDIOVERSION;  Surgeon: Maudine Sos, MD;  Location: [facility] INVASIVE CV LAB;  Service: Cardiovascular;  Laterality: N/A;   CATARACT EXTRACTION Left    COLONOSCOPY     CORONARY STENT INTERVENTION N/A 05/02/2023   Procedure: CORONARY STENT INTERVENTION;  Surgeon: Swaziland, Peter M, MD;  Location: [facility] INVASIVE CV LAB;  Service: Cardiovascular;  Laterality: N/A;   CYSTOSCOPY/URETEROSCOPY/HOLMIUM LASER/STENT PLACEMENT Left 03/18/2023   Procedure: CYSTOSCOPY LEFT URETEROSCOPY/STENT PLACEMENT;  Surgeon: Homero Luster, MD;  Location: [facility] ORS;  Service: Urology;  Laterality: Left;  1 HR FOR CASE   EYE SURGERY     Cataract repaired 08/26/2017   HERNIA REPAIR Bilateral    Over ten years ago per patient. He was not sure of the date.   LEAD INSERTION N/A 11/13/2023   Procedure: LEAD INSERTION;  Surgeon: Efraim Grange, MD;  Location: [facility] INV[facility];  Service: Cardiovascular;  Laterality: N/A;   NASAL SINUS SURGERY Left 02/05/2023   Procedure: ENDOSCOPIC MAXILLARY ANTROSTOMY WITH TISSUE REMOVAL;  Surgeon: Janita Mellow, MD;  Location[facility] OR;  Service: ENT;  Laterality: Left;   PACEMAKER IMPLANT N/A 12/26/2022   Procedure: PACEMAKER IMPLANT;  Surgeon: Efraim Grange, MD;  Location: [facility] INV[facility]IVE CV LAB;  Service: Cardiovascular;  Laterality: N/A;   POLYPECTOMY     RIGHT/LEFT HEART CATH AND CORONARY ANGIOGRAPHY N/A 05/02/2023   Procedure: RIGHT/LEFT HEART CATH AND CORONARY ANGIOGRAPHY;  Surgeon: Swaziland, Peter M, MD;  Location: [facility] INVASIVE CV LAB;  Service: Cardiovascular;  Laterality: N/A;   ROTATOR CUFF REPAIR     both shoulders - patient does not remember the exact date   Current Outpatient Medications on File Prior to Visit  Medication Sig Dispense Refill   acetaminophen  (TYLENOL ) 650 MG CR tablet Take 1,300 mg by mouth every 8 (eight) hours as needed for pain.      ALPRAZolam  (XANAX ) 0.25 MG tablet Take 1 tablet (0.25 mg total) by mouth daily as needed for anxiety. 90 tablet 0   aspirin  EC 81 MG tablet Take 81 mg by mouth daily. Swallow whole.     atorvastatin  (LIPITOR) 40 MG tablet Take 1 tablet (40 mg total) by mouth daily. (Patient taking differently: Take 40 mg by mouth every evening.) 90 tablet 3   ELIQUIS  5 MG TABS tablet TAKE ONE TABLET BY MOUTH 2 TIMES A DAY 60 tablet 5   empagliflozin  (JARDIANCE ) 10 MG TABS tablet Take 1 tablet (10 mg total) by mouth daily before breakfast. 90 tablet 3   ipratropium (ATROVENT) 0.03 % nasal spray Place 2 sprays into both nostrils daily as needed for rhinitis.     Lactobacillus-Inulin (CULTURELLE DIGESTIVE HEALTH PO) Take 1 capsule by mouth daily with lunch.     levothyroxine  (SYNTHROID ) 100 MCG tablet Take 1 tablet (100 mcg total) by mouth daily. 90 tablet 3   loratadine  (CLARITIN ) 10 MG tablet Take  10 mg by mouth daily with breakfast.     losartan  (COZAAR ) 25 MG tablet Take 1 tablet (25 mg total) by mouth daily. 90 tablet 3   metoprolol  succinate (TOPROL -XL) 50 MG 24 hr tablet Take 1 tablet (50 mg total) by mouth daily. Take with or immediately following a meal. 90 tablet 2   mexiletine (MEXITIL ) 200 MG capsule Take 1 capsule (200 mg total) by mouth 2 (two) times daily. 60 capsule 6   Multiple Vitamins-Minerals (CENTRUM SILVER PO) Take 1 tablet by mouth daily with lunch.     nitroGLYCERIN  (NITROSTAT ) 0.4 MG SL tablet Place 1 tablet (0.4 mg total) under the tongue every 5 (five) minutes as needed for chest pain. 25 tablet 3   Omega-3 Fatty Acids (FISH OIL) 1000 MG CAPS Take 2,000 mg by mouth daily with lunch.     pantoprazole  (PROTONIX ) 40 MG tablet Take 1 tablet (40 mg total) by mouth daily. 90 tablet 3   silodosin (RAPAFLO) 8 MG CAPS capsule Take 8 mg by mouth daily with supper.     Sod Bicarb-K Bicarb-Citric Acd (ALKA-SELTZER GOLD) [phone] MG TBEF Take 2 tablets by mouth daily as needed (upset  stomach/indigestion.).     Sodium Chloride -Sodium Bicarb (NETI POT SINUS WASH NA) Place 1 Dose into the nose 2 (two) times daily.     spironolactone  (ALDACTONE ) 25 MG tablet Take 0.5 tablets (12.5 mg total) by mouth every other day. 15 tablet 3   No current facility-administered medications on file prior to visit.   No Known Allergies Social History   Socioeconomic History   Marital status: Married    Spouse name: Corean Deutscher.   Number of children: 2   Years of education: Not on file   Highest education level: Not on file  Occupational History   Not on file  Tobacco Use   Smoking status: Former    Current packs/day: 0.00    Average packs/day: 0.5 packs/day for 6.0 years (3.0 ttl pk-yrs)    Types: Cigarettes    Start date: 06/17/1973    Quit date: 06/18/1979    Years since quitting: 44.4   Smokeless tobacco: Never  Vaping Use   Vaping status: Never Used  Substance and Sexual Activity   Alcohol use: Yes    Comment: Very limited social drinker   Drug use: No   Sexual activity: Yes    Birth control/protection: None  Other Topics Concern   Not on file  Social History Narrative   Not on file   Social Drivers of Health   Financial Resource Strain: Low Risk  (03/04/2023)   Overall Financial Resource Strain (CARDIA)    Difficulty of Paying Living Expenses: Not hard at all  Food Insecurity: No Food Insecurity (11/13/2023)   Hunger Vital Sign    Worried About Running Out of Food in the Last Year: Never true    Ran Out of Food in the Last Year: Never true  Transportation Needs: No Transportation Needs (11/13/2023)   PRAPARE - Administrator, Civil Service (Medical): No    Lack of Transportation (Non-Medical): No  Physical Activity: Sufficiently Active (03/04/2023)   Exercise Vital Sign    Days of Exercise per Week: 6 days    Minutes of Exercise per Session: 30 min  Stress: No Stress Concern Present (03/04/2023)   [employer] of Occupational Health - Occupational  Stress Questionnaire    Feeling of Stress : Only a little  Social Connections: Socially Integrated (11/13/2023)  Social Connection and Isolation Panel [NHANES]    Frequency of Communication with Friends and Family: Twice a week    Frequency of Social Gatherings with Friends and Family: Three times a week    Attends Religious Services: More than 4 times per year    Active Member of Clubs or Organizations: Yes    Attends Banker Meetings: More than 4 times per year    Marital Status: Married  Catering manager Violence: Not At Risk (11/13/2023)   Humiliation, Afraid, Rape, and Kick questionnaire    Fear of Current or Ex-Partner: No    Emotionally Abused: No    Physically Abused: No    Sexually Abused: No   Family History  Problem Relation Age of Onset   Aneurysm Mother    Alzheimer's disease Father    Cancer Sister        ovarian   Stroke Brother    Hypertension Brother    Colon cancer Neg Hx    Esophageal cancer Neg Hx    Stomach cancer Neg Hx    Rectal cancer Neg Hx    Colon polyps Neg Hx      Review of Systems     Objective:   Physical Exam Vitals reviewed.  Constitutional:      General: He is not in acute distress.    Appearance: Normal appearance. He is normal weight. He is not ill-appearing, toxic-appearing or diaphoretic.  HENT:     Head: Normocephalic and atraumatic.     Right Ear: Tympanic membrane, ear canal and external ear normal. There is no impacted cerumen.     Left Ear: Tympanic membrane, ear canal and external ear normal. There is no impacted cerumen.     Nose: Nose normal. No congestion or rhinorrhea.     Mouth/Throat:     Mouth: Mucous membranes are moist.     Pharynx: Oropharynx is clear. No oropharyngeal exudate or posterior oropharyngeal erythema.  Eyes:     General: No scleral icterus.       Right eye: No discharge.        Left eye: No discharge.     Extraocular Movements: Extraocular movements intact.     Conjunctiva/sclera:  Conjunctivae normal.     Pupils: Pupils are equal, round, and reactive to light.  Neck:     Vascular: No carotid bruit.  Cardiovascular:     Rate and Rhythm: Regular rhythm. Bradycardia present.     Pulses: Normal pulses.     Heart sounds: Normal heart sounds. No murmur heard.    No friction rub. No gallop.  Pulmonary:     Effort: Pulmonary effort is normal. No respiratory distress.     Breath sounds: Normal breath sounds. No wheezing, rhonchi or rales.  Abdominal:     General: Abdomen is flat. Bowel sounds are normal. There is no distension.     Palpations: Abdomen is soft. There is no mass.     Tenderness: There is no abdominal tenderness. There is no guarding or rebound.     Hernia: No hernia is present.  Musculoskeletal:        General: No swelling, tenderness or deformity.     Cervical back: Normal range of motion and neck supple. No rigidity or tenderness.     Right lower leg: No edema.     Left lower leg: No edema.  Lymphadenopathy:     Cervical: No cervical adenopathy.  Skin:    General: Skin is warm and dry.  Coloration: Skin is not jaundiced or pale.     Findings: No bruising, erythema, lesion or rash.  Neurological:     General: No focal deficit present.     Mental Status: He is alert and oriented to person, place, and time. Mental status is at baseline.     Cranial Nerves: No cranial nerve deficit.     Sensory: No sensory deficit.     Motor: No weakness.     Coordination: Coordination normal.     Gait: Gait normal.     Deep Tendon Reflexes: Reflexes normal.  Psychiatric:        Mood and Affect: Mood normal.        Behavior: Behavior normal.        Thought Content: Thought content normal.        Judgment: Judgment normal.        Assessment & Plan:  Hypothyroidism, unspecified type - Plan: CBC with Differential/Platelet, Comprehensive metabolic panel with GFR, TSH  Irritable bowel syndrome, unspecified type - Plan: ALPRAZolam  (XANAX ) 0.25 MG  tablet  Chronic systolic congestive heart failure (HCC) Patient has congestive heart failure and is currently on a beta-blocker, losartan , spironolactone , and Jardiance .  I will check a CMP to monitor his potassium and his renal function.  At the present time I do not feel that the patient could tolerate switching losartan  to Entresto  due to hypotension.  He states that in the hospital his blood pressure was quite low.  Therefore we will make no changes in his medication at the present time.  I would like to check a TSH to ensure that he is taking an adequate dosage of levothyroxine .  Otherwise we will make no changes to his medication.  I did refill his Xanax  that he uses sparingly for anxiety which triggers irritable bowel

## 2023-11-26 LAB — CBC WITH DIFFERENTIAL/PLATELET
Absolute Lymphocytes: 1492 {cells}/uL (ref 850–3900)
Absolute Monocytes: 726 {cells}/uL (ref 200–950)
Basophils Absolute: 59 {cells}/uL (ref 0–200)
Basophils Relative: 0.9 %
Eosinophils Absolute: 59 {cells}/uL (ref 15–500)
Eosinophils Relative: 0.9 %
HCT: 48.9 % (ref 38.5–50.0)
Hemoglobin: 16.2 g/dL (ref 13.2–17.1)
MCH: 31.4 pg (ref 27.0–33.0)
MCHC: 33.1 g/dL (ref 32.0–36.0)
MCV: 94.8 fL (ref 80.0–100.0)
MPV: 12.2 fL (ref 7.5–12.5)
Monocytes Relative: 11 %
Neutro Abs: 4264 {cells}/uL (ref 1500–7800)
Neutrophils Relative %: 64.6 %
Platelets: 161 10*3/uL (ref 140–400)
RBC: 5.16 10*6/uL (ref 4.20–5.80)
RDW: 13.2 % (ref 11.0–15.0)
Total Lymphocyte: 22.6 %
WBC: 6.6 10*3/uL (ref 3.8–10.8)

## 2023-11-26 LAB — COMPREHENSIVE METABOLIC PANEL WITH GFR
AG Ratio: 1.8 (calc) (ref 1.0–2.5)
ALT: 12 U/L (ref 9–46)
AST: 16 U/L (ref 10–35)
Albumin: 4.4 g/dL (ref 3.6–5.1)
Alkaline phosphatase (APISO): 92 U/L (ref 35–144)
BUN: 18 mg/dL (ref 7–25)
CO2: 27 mmol/L (ref 20–32)
Calcium: 9.1 mg/dL (ref 8.6–10.3)
Chloride: 105 mmol/L (ref 98–110)
Creat: 0.94 mg/dL (ref 0.70–1.28)
Globulin: 2.5 g/dL (ref 1.9–3.7)
Glucose, Bld: 80 mg/dL (ref 65–99)
Potassium: 4.6 mmol/L (ref 3.5–5.3)
Sodium: 140 mmol/L (ref 135–146)
Total Bilirubin: 1.4 mg/dL — ABNORMAL HIGH (ref 0.2–1.2)
Total Protein: 6.9 g/dL (ref 6.1–8.1)
eGFR: 84 mL/min/{1.73_m2} (ref >=60)

## 2023-11-26 LAB — TSH: TSH: 1.53 m[IU]/L (ref 0.40–4.50)

## 2023-11-27 ENCOUNTER — Ambulatory Visit: Payer: Self-pay | Admitting: Family Medicine

## 2023-11-27 ENCOUNTER — Ambulatory Visit: Attending: Cardiology

## 2023-11-27 DIAGNOSIS — I442 Atrioventricular block, complete: Secondary | ICD-10-CM

## 2023-11-27 NOTE — Progress Notes (Signed)
 Normal Bi-V chamber pacemaker wound check. Presenting rhythm: AP/BP 75 . Wound well healed. Routine testing performed. Thresholds, sensing, and impedances consistent with implant measurements and auto capture until 3 month visit. No episodes. Reviewed arm restrictions to continue for 6 weeks total post op.  Pt enrolled in remote follow-up.  Programming Change: - LV pacing threshold pulse width extended to 0.34ms for improved output (1.25 V @ 0.9 ms).  - Improvement from initial threshold of 2.0 V @ 0.70ms.

## 2023-11-27 NOTE — Patient Instructions (Signed)
   After Your Pacemaker   Monitor your pacemaker site for redness, swelling, and drainage. Call the device clinic at [phone] if you experience these symptoms or fever/chills.  Your incision was closed with Steri-strips or staples:  You may shower 7 days after your procedure and wash your incision with soap and water . Avoid lotions, ointments, or perfumes over your incision until it is well-healed.  You may use a hot tub or a pool after your wound check appointment if the incision is completely closed.  Do not lift, push or pull greater than 10 pounds with the affected arm until 6 weeks after your procedure. UNTIL AFTER JULY 10TH.  There are no other restrictions in arm movement after your wound check appointment.  You may drive, unless driving has been restricted by your healthcare providers.  Remote monitoring is used to monitor your pacemaker from home. This monitoring is scheduled every 91 days by our office. It allows us  to keep an eye on the functioning of your device to ensure it is working properly. You will routinely see your Electrophysiologist annually (more often if necessary).

## 2023-12-15 ENCOUNTER — Other Ambulatory Visit: Payer: Self-pay | Admitting: Family Medicine

## 2023-12-15 DIAGNOSIS — E039 Hypothyroidism, unspecified: Secondary | ICD-10-CM

## 2023-12-29 ENCOUNTER — Ambulatory Visit

## 2023-12-29 DIAGNOSIS — I442 Atrioventricular block, complete: Secondary | ICD-10-CM | POA: Diagnosis not present

## 2023-12-29 LAB — CUP PACEART REMOTE DEVICE CHECK
Battery Remaining Longevity: 81 mo
Battery Remaining Percentage: 95.5 %
Battery Voltage: 2.99 V
Brady Statistic AP VP Percent: 87 %
Brady Statistic AP VS Percent: 11 %
Brady Statistic AS VP Percent: 1.2 %
Brady Statistic AS VS Percent: 1 %
Brady Statistic RA Percent Paced: 96 %
Date Time Interrogation Session: 20250714020009
Implantable Lead Connection Status: 753985
Implantable Lead Connection Status: 753985
Implantable Lead Connection Status: 753985
Implantable Lead Implant Date: 20240711
Implantable Lead Implant Date: 20240711
Implantable Lead Implant Date: 20250529
Implantable Lead Location: 753858
Implantable Lead Location: 753859
Implantable Lead Location: 753860
Implantable Pulse Generator Implant Date: 20250529
Lead Channel Impedance Value: 1100 Ohm
Lead Channel Impedance Value: 480 Ohm
Lead Channel Impedance Value: 510 Ohm
Lead Channel Pacing Threshold Amplitude: 0.75 V
Lead Channel Pacing Threshold Amplitude: 1 V
Lead Channel Pacing Threshold Amplitude: 1.125 V
Lead Channel Pacing Threshold Pulse Width: 0.5 ms
Lead Channel Pacing Threshold Pulse Width: 0.5 ms
Lead Channel Pacing Threshold Pulse Width: 0.9 ms
Lead Channel Sensing Intrinsic Amplitude: 10.3 mV
Lead Channel Sensing Intrinsic Amplitude: 5 mV
Lead Channel Setting Pacing Amplitude: 2 V
Lead Channel Setting Pacing Amplitude: 2.125
Lead Channel Setting Pacing Amplitude: 2.5 V
Lead Channel Setting Pacing Pulse Width: 0.5 ms
Lead Channel Setting Pacing Pulse Width: 0.9 ms
Lead Channel Setting Sensing Sensitivity: 2 mV
Pulse Gen Model: 3562
Pulse Gen Serial Number: 8279232

## 2023-12-30 ENCOUNTER — Telehealth: Payer: Self-pay | Admitting: Pharmacy Technician

## 2023-12-30 ENCOUNTER — Other Ambulatory Visit (HOSPITAL_COMMUNITY): Payer: Self-pay

## 2023-12-30 NOTE — Telephone Encounter (Signed)
  Pharmacy filled after all as 15 tablets for 30 days

## 2024-01-07 ENCOUNTER — Ambulatory Visit: Payer: Self-pay | Admitting: Cardiovascular Disease

## 2024-01-19 ENCOUNTER — Ambulatory Visit: Admitting: Cardiology

## 2024-01-21 NOTE — Progress Notes (Unsigned)
 Cardiology Office Note:    Date:  01/22/2024   ID:  Carlos Choi, DOB 1946-04-18, MRN [id number]  PCP:  Duanne Butler DASEN, MD  Cardiologist:  Lonni LITTIE Nanas, MD  Electrophysiologist:  Eulas FORBES Furbish, MD   Referring MD: Duanne Butler DASEN, MD   Chief Complaint  Patient presents with   Congestive Heart Failure    History of Present Illness:    Carlos Choi is a 78 y.o. male with a hx of atrial flutter, ASD, heart block status post PPM, bronchiectasis, hypertension, tobacco use, hypothyroidism who is referred by Dr. Furbish for evaluation of heart failure.  He was initially diagnosed with atrial flutter 10/2022.  At that time he was started on Eliquis  and underwent DCCV.  Presented with complete heart block and underwent PPM 12/2022.  Echocardiogram 11/15/2022 showed EF 45 to 50%, normal RV function, moderate RV enlargement, moderate left atrial enlargement, severe right atrial enlargement, moderate MR, small secundum ASD with left-to-right shunting.  Echocardiogram 04/04/2023 showed EF 25 to 30%, mild RV dysfunction, mild mitral regurgitation, small secundum ASD.  LHC/RHC 05/02/2023 showed 99% stenosis in mid LAD status post DES, no significant shunt, mildly elevated filling pressures (RA 11, RV 43/6, PA 41/25/30, PCWP 23, CI 2.9).  Cardiac MRI 05/28/2023 showed ASD with Qp/Qs 1.2, LVEF 26%, septal dyskinesis consistent with RV pacing, RVEF 42%, subendocardial LGE in mid to apical anteroseptum consistent with prior infarct, mild mitral regurgitation (regurgitant fraction 18%).  Echocardiogram 09/24/23 showed EF 25-30%, mild RV dysfunction, mild to moderate MR.  Underwent BiV ICD upgrade 10/2023.    Since last clinic visit, he reports he is doing okay.  Denies any chest pain, dyspnea, lightheadedness, syncope, lower extremity edema, or palpitations.  Does report feeling fatigued.  He is walking 1.5 miles per day.  He is on Eliquis  and aspirin , denies any bleeding issues   Past Medical  History:  Diagnosis Date   Allergy    Anxiety    Arthritis    mild ankles    ASD (atrial septal defect)    ASD (small secundum ASD with predominantly left-to-right shunting) 11/15/22 TTE   Atrial flutter (HCC)    s/p DCCV 11/04/22   Bronchiectasis (HCC)    Cataract    removed left eye, forming right eye    Clotting disorder (HCC)    Bruise easily & bleeding from cuts normally stops quickly   Diverticulosis    Dysrhythmia    2nd degree AV block, bradycardia s/p PPM 12/26/22; PVCs   GERD (gastroesophageal reflux disease)    Hemorrhoids    History of kidney stones    Hypertension    Hypothyroid    Presence of permanent cardiac pacemaker 12/26/2022   St. Jude/A[employer]ott   Rectal bleeding    due to hemorrhoid    Tubular adenoma of colon 2015    Past Surgical History:  Procedure Laterality Date   BIV UPGRADE N/A 11/13/2023   Procedure: BIV UPGRADE;  Surgeon: Mealor, Eulas FORBES, MD;  Location: [facility];  Service: Cardiovascular;  Laterality: N/A;   CARDIOVERSION N/A 11/14/2022   Procedure: CARDIOVERSION;  Surgeon: Jeffrie Oneil BROCKS, MD;  Location: [facility];  Service: Cardiovascular;  Laterality: N/A;   CARDIOVERSION N/A 07/15/2023   Procedure: CARDIOVERSION;  Surgeon: Raford Riggs, MD;  Location: [facility] INVASIVE CV LAB;  Service: Cardiovascular;  Laterality: N/A;   CATARACT EXTRACTION Left    COLONOSCOPY     CORONARY STENT INTERVENTION N/A 05/02/2023   Procedure: CORONARY  STENT INTERVENTION;  Surgeon: Swaziland, Peter M, MD;  Location: [facility] INVASIVE CV LAB;  Service: Cardiovascular;  Laterality: N/A;   CYSTOSCOPY/URETEROSCOPY/HOLMIUM LASER/STENT PLACEMENT Left 03/18/2023   Procedure: CYSTOSCOPY LEFT URETEROSCOPY/STENT PLACEMENT;  Surgeon: Watt Rush, MD;  Location: [facility] OR[facility];  Service: Urology;  Laterality: Left;  1 HR FOR CASE   EYE SURGERY     Cataract repaired 08/26/2017   HERNIA REPAIR Bilateral    Over ten years ago per patient. He was not sure of the date.   LEAD  INSERTION N/A 11/13/2023   Procedure: LEAD INSERTION;  Surgeon: Nancey Eulas BRAVO, MD;  Location: [facility];  Service: Cardiovascular;  Laterality: N/A;   NASAL SINUS SURGERY Left 02/05/2023   Procedure: ENDOSCOPIC MAXILLARY ANTROSTOMY WITH TISSUE REMOVAL;  Surgeon: Jesus Oliphant, MD;  Location: [facility] OR;  Service: ENT;  Laterality: Left;   PACEMAKER IMPLANT N/A 12/26/2022   Procedure: PACEMAKER IMPLANT;  Surgeon: Nancey Eulas BRAVO, MD;  Location: [facility];  Service: Cardiovascular;  Laterality: N/A;   POLYPECTOMY     RIGHT/LEFT HEART CATH AND CORONARY ANGIOGRAPHY N/A 05/02/2023   Procedure: RIGHT/LEFT HEART CATH AND CORONARY ANGIOGRAPHY;  Surgeon: Swaziland, Peter M, MD;  Location: [facility] INVASIVE CV LAB;  Service: Cardiovascular;  Laterality: N/A;   ROTATOR CUFF REPAIR     both shoulders - patient does not remember the exact date    Current Medications: Current Meds  Medication Sig   acetaminophen  (TYLENOL ) 650 MG CR tablet Take 1,300 mg by mouth every 8 (eight) hours as needed for pain.   ALPRAZolam  (XANAX ) 0.25 MG tablet Take 1 tablet (0.25 mg total) by mouth daily as needed for anxiety.   aspirin  EC 81 MG tablet Take 81 mg by mouth daily. Swallow whole.   atorvastatin  (LIPITOR) 40 MG tablet Take 1 tablet (40 mg total) by mouth daily.   ELIQUIS  5 MG TABS tablet TAKE ONE TABLET BY MOUTH 2 TIMES A DAY   empagliflozin  (JARDIANCE ) 10 MG TABS tablet Take 1 tablet (10 mg total) by mouth daily before breakfast.   ipratropium (ATROVENT) 0.03 % nasal spray Place 2 sprays into both nostrils daily as needed for rhinitis.   Lactobacillus-Inulin (CULTURELLE DIGESTIVE HEALTH PO) Take 1 capsule by mouth daily with lunch.   levothyroxine  (SYNTHROID ) 100 MCG tablet TAKE 1 TABLET BY MOUTH DAILY   loratadine  (CLARITIN ) 10 MG tablet Take 10 mg by mouth daily with breakfast.   losartan  (COZAAR ) 25 MG tablet Take 1 tablet (25 mg total) by mouth daily.   metoprolol  succinate (TOPROL -XL) 50 MG 24 hr  tablet Take 1 tablet (50 mg total) by mouth daily. Take with or immediately following a meal.   mexiletine (MEXITIL ) 200 MG capsule Take 1 capsule (200 mg total) by mouth 2 (two) times daily.   Multiple Vitamins-Minerals (CENTRUM SILVER PO) Take 1 tablet by mouth daily with lunch.   nitroGLYCERIN  (NITROSTAT ) 0.4 MG SL tablet Place 1 tablet (0.4 mg total) under the tongue every 5 (five) minutes as needed for chest pain.   Omega-3 Fatty Acids (FISH OIL) 1000 MG CAPS Take 2,000 mg by mouth daily with lunch.   pantoprazole  (PROTONIX ) 40 MG tablet Take 1 tablet (40 mg total) by mouth daily.   silodosin (RAPAFLO) 8 MG CAPS capsule Take 8 mg by mouth daily with supper.   Sod Bicarb-K Bicarb-Citric Acd (ALKA-SELTZER GOLD) [id number] MG TBEF Take 2 tablets by mouth daily as needed (upset stomach/indigestion.).   Sodium Chloride -Sodium Bicarb (NETI POT SINUS WASH NA)  Place 1 Dose into the nose 2 (two) times daily.   spironolactone  (ALDACTONE ) 25 MG tablet Take 0.5 tablets (12.5 mg total) by mouth every other day.     Allergies:   Patient has no known allergies.   Social History   Socioeconomic History   Marital status: Married    Spouse name: Devere BROCKS.   Number of children: 2   Years of education: Not on file   Highest education level: Not on file  Occupational History   Not on file  Tobacco Use   Smoking status: Former    Current packs/day: 0.00    Average packs/day: 0.5 packs/day for 6.0 years (3.0 ttl pk-yrs)    Types: Cigarettes    Start date: 06/17/1973    Quit date: 06/18/1979    Years since quitting: 44.6   Smokeless tobacco: Never   Tobacco comments:    Former smoker 01/22/24  Vaping Use   Vaping status: Never Used  Substance and Sexual Activity   Alcohol use: Yes    Comment: Very limited social drinker   Drug use: No   Sexual activity: Yes    Birth control/protection: None  Other Topics Concern   Not on file  Social History Narrative   Not on file   Social Drivers of  Health   Financial Resource Strain: Low Risk  (03/04/2023)   Overall Financial Resource Strain (CARDIA)    Difficulty of Paying Living Expenses: Not hard at all  Food Insecurity: No Food Insecurity (11/13/2023)   Hunger Vital Sign    Worried About Running Out of Food in the Last Year: Never true    Ran Out of Food in the Last Year: Never true  Transportation Needs: No Transportation Needs (11/13/2023)   PRAPARE - Administrator, Civil Service (Medical): No    Lack of Transportation (Non-Medical): No  Physical Activity: Sufficiently Active (03/04/2023)   Exercise Vital Sign    Days of Exercise per Week: 6 days    Minutes of Exercise per Session: 30 min  Stress: No Stress Concern Present (03/04/2023)   [employer] of Occupational Health - Occupational Stress Questionnaire    Feeling of Stress : Only a little  Social Connections: Socially Integrated (11/13/2023)   Social Connection and Isolation Panel    Frequency of Communication with Friends and Family: Twice a week    Frequency of Social Gatherings with Friends and Family: Three times a week    Attends Religious Services: More than 4 times per year    Active Member of Clubs or Organizations: Yes    Attends Engineer, structural: More than 4 times per year    Marital Status: Married     Family History: [name] family history includes Alzheimer's disease in his father; Aneurysm in his mother; Cancer in his sister; Hypertension in his brother; Stroke in his brother. There is no history of Colon cancer, Esophageal cancer, Stomach cancer, Rectal cancer, or Colon polyps.  ROS:   Please see the history of present illness.     All other systems reviewed and are negative.  EKGs/Labs/Other Studies Reviewed:    The following studies were reviewed today:   EKG:   03/20/2023: V-paced, frequent PVCs 06/22/2022: V-paced, PVC, rate 76  Recent Labs: 10/31/2023: Magnesium 2.2 11/25/2023: ALT 12; BUN 18; Creat 0.94;  Hemoglobin 16.2; Platelets 161; Potassium 4.6; Sodium 140; TSH 1.53  Recent Lipid Panel    Component Value Date/Time   CHOL 133 06/27/2023 0925  TRIG 96 06/27/2023 0925   HDL 47 06/27/2023 0925   CHOLHDL 2.8 06/27/2023 0925   VLDL 19 06/27/2023 0925   LDLCALC 67 06/27/2023 0925   LDLCALC 58 12/05/2022 1008    Physical Exam:    VS:  BP 98/70   Pulse 70   Ht 6' 4 (1.93 m)   Wt 177 lb (80.3 kg)   SpO2 96%   BMI 21.55 kg/m     Wt Readings from Last 3 Encounters:  01/22/24 177 lb (80.3 kg)  11/25/23 177 lb 9.6 oz (80.6 kg)  11/14/23 177 lb 7.5 oz (80.5 kg)     GEN:  Well nourished, well developed in no acute distress HEENT: Normal NECK: No JVD; No carotid bruits CARDIAC: RRR, no murmurs, rubs, gallops RESPIRATORY:  Clear to auscultation without rales, wheezing or rhonchi  ABDOMEN: Soft, non-tender, non-distended MUSCULOSKELETAL:  No edema; No deformity  SKIN: Warm and dry NEUROLOGIC:  Alert and oriented x 3 PSYCHIATRIC:  Normal affect   ASSESSMENT:    1. Chronic combined systolic and diastolic heart failure (HCC)   2. Pain in both lower extremities   3. CAD in native artery   4. Atrial flutter, unspecified type (HCC)   5. Frequent PVCs   6. Hyperlipidemia, unspecified hyperlipidemia type       PLAN:    Chronic combined systolic and diastolic heart failure: EF 45 to 50% on echo 10/2022.  Echocardiogram 04/04/2023 showed EF 25 to 30%, mild RV dysfunction, my left atrial lodgment, mild mitral regurgitation, small secundum ASD.  LHC/RHC 05/02/2023 showed 99% stenosis in mid LAD status post DES, no significant shunt, mildly elevated filling pressures (RA 11, RV 43/6, PA 41/25/30, PCWP 23, CI 2.9).  Cardiac MRI 05/28/2023 showed ASD with Qp/Qs 1.2, LVEF 26%, septal dyskinesis consistent with RV pacing, RVEF 42%, subendocardial LGE in mid to apical anteroseptum consistent with prior infarct, mild mitral regurgitation (regurgitant fraction 18%).  Echocardiogram 09/24/23 showed  EF 25-30%, mild RV dysfunction, mild to moderate MR. -Continue Toprol -XL 50 mg daily -Continue losartan  25 mg daily, did not start Entresto  due to hyperkalemia -Continue Jardiance  10 mg daily -Continue spironolactone  12.5 mg every other day.  -Check BMET, magnesium -Given LVEF remains less than 35% despite GDMT, and considering he is predominatly RV paced rhythm, referred to EP and underwent BiV ICD upgrade 10/2023 - Update echocardiogram in 1 month  CAD: LHC 05/02/2023 showed 50% proximal LAD, 99% mid LAD, 70% OM1 stenosis, status post DES to mid LAD. -Completed 1 month of triple therapy, and 6 months on Plavix  plus Eliquis .  Was having a lot of bruising on Plavix , now on aspirin  plus Eliquis  -Continue atorvastatin   Atrial flutter: Diagnosed 10/2022.  Underwent DCCV at that time.  Had recurrent atrial flutter and underwent repeat DCCV 07/15/23.  Has followed with EP to consider ablation, sees Dr. Nancey. -Continue Eliquis  -Continue Toprol -XL 50 mg daily  Heart block: Status post PPM, now upgraded to BiV ICD as above.  Follows with EP  Frequent PVCs: continue Toprol -XL 50 mg daily.  Stated on mexiletine by EP.  ASD: Small secundum ASD with left to right shunting noted on echocardiogram 10/2022.  No significant shunting on cath as above.  Small shunt (Qp/Qs 1.2) on CMR 05/2023  Hyperlipidemia: On atorvastatin  40 mg daily.  LDL 67 on 06/2023  Leg pain: Check ABIs  RTC in 3 months  Medication Adjustments/Labs and Tests Ordered: Current medicines are reviewed at length with the patient today.  Concerns regarding medicines are  outlined above.  Orders Placed This Encounter  Procedures   Basic metabolic panel with GFR   Magnesium   ECHOCARDIOGRAM COMPLETE   VAS US  ABI WITH/WO TBI   No orders of the defined types were placed in this encounter.   Patient Instructions  Medication Instructions:  Your physician recommends that you continue on your current medications as directed. Please  refer to the Current Medication list given to you today.  *If you need a refill on your cardiac medications before your next appointment, please call your pharmacy*  Lab Work: BMET & Magnesium today or at our convenience   Testing/Procedures: Your physician has requested that you have an ankle brachial index (ABI). During this test an ultrasound and blood pressure cuff are used to evaluate the arteries that supply the arms and legs with blood. Allow thirty minutes for this exam. There are no restrictions or special instructions.  Please note: We ask at that you not bring children with you during ultrasound (echo/ vascular) testing. Due to room size and safety concerns, children are not allowed in the ultrasound rooms during exams. Our front office staff cannot provide observation of children in our lobby area while testing is being conducted. An adult accompanying a patient to their appointment will only be allowed in the ultrasound room at the discretion of the ultrasound technician under special circumstances. We apologize for any inconvenience.   Your physician has requested that you have an echocardiogram. Echocardiography is a painless test that uses sound waves to create images of your heart. It provides your doctor with information about the size and shape of your heart and how well your heart's chambers and valves are working. This procedure takes approximately one hour. There are no restrictions for this procedure. Please do NOT wear cologne, perfume, aftershave, or lotions (deodorant is allowed). Please arrive 15 minutes prior to your appointment time.  Please note: We ask at that you not bring children with you during ultrasound (echo/ vascular) testing. Due to room size and safety concerns, children are not allowed in the ultrasound rooms during exams. Our front office staff cannot provide observation of children in our lobby area while testing is being conducted. An adult accompanying a  patient to their appointment will only be allowed in the ultrasound room at the discretion of the ultrasound technician under special circumstances. We apologize for any inconvenience.    Follow-Up: At [facility], you and your health needs are our priority.  As part of our continuing mission to provide you with exceptional heart care, our providers are all part of one team.  This team includes your primary Cardiologist (physician) and Advanced Practice Providers or APPs (Physician Assistants and Nurse Practitioners) who all work together to provide you with the care you need, when you need it.  Your next appointment:   3 month(s)  Provider:   Lonni LITTIE Nanas, MD    We recommend signing up for the patient portal called MyChart.  Sign up information is provided on this After Visit Summary.  MyChart is used to connect with patients for Virtual Visits (Telemedicine).  Patients are able to view lab/test results, encounter notes, upcoming appointments, etc.  Non-urgent messages can be sent to your provider as well.   To learn more about what you can do with MyChart, go to [email or web address].           Signed, Lonni LITTIE Nanas, MD  01/22/2024 5:16 PM    [facility]

## 2024-01-22 ENCOUNTER — Ambulatory Visit: Attending: Cardiology | Admitting: Cardiology

## 2024-01-22 ENCOUNTER — Encounter: Payer: Self-pay | Admitting: Cardiology

## 2024-01-22 VITALS — BP 98/70 | HR 70 | Ht 76.0 in | Wt 177.0 lb

## 2024-01-22 DIAGNOSIS — I493 Ventricular premature depolarization: Secondary | ICD-10-CM

## 2024-01-22 DIAGNOSIS — M79604 Pain in right leg: Secondary | ICD-10-CM

## 2024-01-22 DIAGNOSIS — M79605 Pain in left leg: Secondary | ICD-10-CM | POA: Diagnosis not present

## 2024-01-22 DIAGNOSIS — I4892 Unspecified atrial flutter: Secondary | ICD-10-CM

## 2024-01-22 DIAGNOSIS — E785 Hyperlipidemia, unspecified: Secondary | ICD-10-CM

## 2024-01-22 DIAGNOSIS — I5042 Chronic combined systolic (congestive) and diastolic (congestive) heart failure: Secondary | ICD-10-CM | POA: Diagnosis not present

## 2024-01-22 DIAGNOSIS — I2510 Atherosclerotic heart disease of native coronary artery without angina pectoris: Secondary | ICD-10-CM

## 2024-01-22 NOTE — Patient Instructions (Signed)
 Medication Instructions:  Your physician recommends that you continue on your current medications as directed. Please refer to the Current Medication list given to you today.  *If you need a refill on your cardiac medications before your next appointment, please call your pharmacy*  Lab Work: BMET & Magnesium today or at our convenience   Testing/Procedures: Your physician has requested that you have an ankle brachial index (ABI). During this test an ultrasound and blood pressure cuff are used to evaluate the arteries that supply the arms and legs with blood. Allow thirty minutes for this exam. There are no restrictions or special instructions.  Please note: We ask at that you not bring children with you during ultrasound (echo/ vascular) testing. Due to room size and safety concerns, children are not allowed in the ultrasound rooms during exams. Our front office staff cannot provide observation of children in our lobby area while testing is being conducted. An adult accompanying a patient to their appointment will only be allowed in the ultrasound room at the discretion of the ultrasound technician under special circumstances. We apologize for any inconvenience.   Your physician has requested that you have an echocardiogram. Echocardiography is a painless test that uses sound waves to create images of your heart. It provides your doctor with information about the size and shape of your heart and how well your heart's chambers and valves are working. This procedure takes approximately one hour. There are no restrictions for this procedure. Please do NOT wear cologne, perfume, aftershave, or lotions (deodorant is allowed). Please arrive 15 minutes prior to your appointment time.  Please note: We ask at that you not bring children with you during ultrasound (echo/ vascular) testing. Due to room size and safety concerns, children are not allowed in the ultrasound rooms during exams. Our front office  staff cannot provide observation of children in our lobby area while testing is being conducted. An adult accompanying a patient to their appointment will only be allowed in the ultrasound room at the discretion of the ultrasound technician under special circumstances. We apologize for any inconvenience.    Follow-Up: At [facility], you and your health needs are our priority.  As part of our continuing mission to provide you with exceptional heart care, our providers are all part of one team.  This team includes your primary Cardiologist (physician) and Advanced Practice Providers or APPs (Physician Assistants and Nurse Practitioners) who all work together to provide you with the care you need, when you need it.  Your next appointment:   3 month(s)  Provider:   Lonni LITTIE Nanas, MD    We recommend signing up for the patient portal called MyChart.  Sign up information is provided on this After Visit Summary.  MyChart is used to connect with patients for Virtual Visits (Telemedicine).  Patients are able to view lab/test results, encounter notes, upcoming appointments, etc.  Non-urgent messages can be sent to your provider as well.   To learn more about what you can do with MyChart, go to [email or web address].

## 2024-01-23 ENCOUNTER — Ambulatory Visit: Payer: Self-pay | Admitting: Cardiology

## 2024-01-23 LAB — BASIC METABOLIC PANEL WITH GFR
BUN/Creatinine Ratio: 12 (ref 10–24)
BUN: 12 mg/dL (ref 8–27)
CO2: 22 mmol/L (ref 20–29)
Calcium: 9.5 mg/dL (ref 8.6–10.2)
Chloride: 101 mmol/L (ref 96–106)
Creatinine, Ser: 1.04 mg/dL (ref 0.76–1.27)
Glucose: 76 mg/dL (ref 70–99)
Potassium: 4.6 mmol/L (ref 3.5–5.2)
Sodium: 140 mmol/L (ref 134–144)
eGFR: 74 mL/min/1.73 (ref >59)

## 2024-01-23 LAB — MAGNESIUM: Magnesium: 2.3 mg/dL (ref 1.6–2.3)

## 2024-01-29 ENCOUNTER — Ambulatory Visit (HOSPITAL_COMMUNITY)
Admission: RE | Admit: 2024-01-29 | Discharge: 2024-01-29 | Disposition: A | Discharge status: Home / Self Care | Source: Ambulatory Visit | Attending: Cardiology | Admitting: Cardiology

## 2024-01-29 DIAGNOSIS — M79604 Pain in right leg: Secondary | ICD-10-CM | POA: Insufficient documentation

## 2024-01-29 DIAGNOSIS — M79605 Pain in left leg: Secondary | ICD-10-CM | POA: Insufficient documentation

## 2024-01-31 LAB — VAS US ABI WITH/WO TBI
Left ABI: 1.29
Right ABI: 1.25

## 2024-02-02 NOTE — Telephone Encounter (Signed)
 Called and made patient and spouse aware per Dr. Kate normal ABIs. Understanding verbalized

## 2024-02-12 ENCOUNTER — Ambulatory Visit (INDEPENDENT_AMBULATORY_CARE_PROVIDER_SITE_OTHER): Payer: Self-pay | Admitting: Podiatry

## 2024-02-12 ENCOUNTER — Ambulatory Visit (INDEPENDENT_AMBULATORY_CARE_PROVIDER_SITE_OTHER)

## 2024-02-12 ENCOUNTER — Encounter: Payer: Self-pay | Admitting: Podiatry

## 2024-02-12 VITALS — Ht 76.0 in | Wt 177.0 lb

## 2024-02-12 DIAGNOSIS — L84 Corns and callosities: Secondary | ICD-10-CM

## 2024-02-12 DIAGNOSIS — M778 Other enthesopathies, not elsewhere classified: Secondary | ICD-10-CM | POA: Diagnosis not present

## 2024-02-12 DIAGNOSIS — M21612 Bunion of left foot: Secondary | ICD-10-CM | POA: Diagnosis not present

## 2024-02-12 DIAGNOSIS — M21611 Bunion of right foot: Secondary | ICD-10-CM

## 2024-02-12 DIAGNOSIS — M779 Enthesopathy, unspecified: Secondary | ICD-10-CM

## 2024-02-12 DIAGNOSIS — D689 Coagulation defect, unspecified: Secondary | ICD-10-CM | POA: Diagnosis not present

## 2024-02-12 NOTE — Progress Notes (Unsigned)
 Orthotics   Patient was present and evaluated for Custom molded foot orthotics. Patient will benefit from CFO's to provide total contact to BIL MLA's helping to balance and distribute body weight more evenly across BIL feet helping to reduce plantar pressure and pain. Orthotic will also encourage FF / RF alignment  Patient was scanned today and will return for fitting upon receipt

## 2024-02-13 NOTE — Progress Notes (Signed)
 Subjective:   Patient ID: Carlos Choi, male   DOB: 78 y.o.   MRN: [id number]   HPI Patient presents stating that he has had quite a bit of discomfort in his feet right over left and he gets lesions on the plantar first metatarsal which can be sore and does take a blood thinner.  States his doctor wants him walking a lot and it is hard due to the diminished fat pad noted in both feet.  He does not smoke tries to be active   Review of Systems  All other systems reviewed and are negative.       Objective:  Physical Exam Vitals and nursing note reviewed.  Constitutional:      Appearance: He is well-developed.  Pulmonary:     Effort: Pulmonary effort is normal.  Musculoskeletal:        General: Normal range of motion.  Skin:    General: Skin is warm.  Neurological:     Mental Status: He is alert.     Neurovascular status was found to be intact muscle strength mildly reduced range of motion subtalar joint tarsal joint mildly reduced with flatfoot deformity noted bilateral but no indication of ankle arthritis.  He is found to have prominent metatarsal heads bilateral and does have keratotic lesion subfirst metatarsal bilateral that can be discomforting for him at times.  Good digital perfusion well-oriented x 3     Assessment:  Structural deformity with patient also noted to have bunion deformity prominent metatarsal heads and diminished fat pad     Plan:  H&P all conditions reviewed and I have recommended a soft orthotic and had him discussed with pedorthist who agrees and makes him a orthotic that will offload weight.  I then discussed lesions I did debridement of 2 lesions no iatrogenic bleeding reappoint routine care  X-rays indicate that there is moderate bunion deformity bilateral right over left and there is what appears to be some digital deformities with prominence of the metatarsal heads

## 2024-02-14 ENCOUNTER — Other Ambulatory Visit: Payer: Self-pay | Admitting: Cardiology

## 2024-02-14 DIAGNOSIS — I5042 Chronic combined systolic (congestive) and diastolic (congestive) heart failure: Secondary | ICD-10-CM

## 2024-02-17 ENCOUNTER — Other Ambulatory Visit: Payer: Self-pay | Admitting: Cardiology

## 2024-02-17 NOTE — Telephone Encounter (Signed)
 Prescription refill request for Eliquis  received. Indication:aflutter Last office visit:8/25 Scr:1.04  8/25 Age: 78 Weight:80.3  kg  Prescription refilled

## 2024-02-18 ENCOUNTER — Ambulatory Visit: Attending: Cardiovascular Disease | Admitting: Cardiovascular Disease

## 2024-02-18 ENCOUNTER — Encounter: Payer: Self-pay | Admitting: Cardiovascular Disease

## 2024-02-18 VITALS — BP 118/60 | HR 70 | Ht 76.0 in | Wt 175.0 lb

## 2024-02-18 DIAGNOSIS — I483 Typical atrial flutter: Secondary | ICD-10-CM

## 2024-02-18 DIAGNOSIS — I4891 Unspecified atrial fibrillation: Secondary | ICD-10-CM | POA: Diagnosis not present

## 2024-02-18 DIAGNOSIS — I442 Atrioventricular block, complete: Secondary | ICD-10-CM | POA: Diagnosis not present

## 2024-02-18 DIAGNOSIS — I493 Ventricular premature depolarization: Secondary | ICD-10-CM | POA: Diagnosis not present

## 2024-02-18 LAB — CUP PACEART INCLINIC DEVICE CHECK
Battery Remaining Longevity: 78 mo
Battery Voltage: 2.98 V
Brady Statistic RA Percent Paced: 96 %
Brady Statistic RV Percent Paced: 88 %
Date Time Interrogation Session: 20250903133622
Implantable Lead Connection Status: 753985
Implantable Lead Connection Status: 753985
Implantable Lead Connection Status: 753985
Implantable Lead Implant Date: 20240711
Implantable Lead Implant Date: 20240711
Implantable Lead Implant Date: 20250529
Implantable Lead Location: 753858
Implantable Lead Location: 753859
Implantable Lead Location: 753860
Implantable Pulse Generator Implant Date: 20250529
Lead Channel Impedance Value: 1025 Ohm
Lead Channel Impedance Value: 462.5 Ohm
Lead Channel Impedance Value: 525 Ohm
Lead Channel Pacing Threshold Amplitude: 0.75 V
Lead Channel Pacing Threshold Amplitude: 0.75 V
Lead Channel Pacing Threshold Amplitude: 0.75 V
Lead Channel Pacing Threshold Amplitude: 0.75 V
Lead Channel Pacing Threshold Amplitude: 0.75 V
Lead Channel Pacing Threshold Amplitude: 0.75 V
Lead Channel Pacing Threshold Pulse Width: 0.5 ms
Lead Channel Pacing Threshold Pulse Width: 0.5 ms
Lead Channel Pacing Threshold Pulse Width: 0.5 ms
Lead Channel Pacing Threshold Pulse Width: 0.5 ms
Lead Channel Pacing Threshold Pulse Width: 0.9 ms
Lead Channel Pacing Threshold Pulse Width: 0.9 ms
Lead Channel Sensing Intrinsic Amplitude: 3.9 mV
Lead Channel Sensing Intrinsic Amplitude: 7.2 mV
Lead Channel Setting Pacing Amplitude: 2 V
Lead Channel Setting Pacing Amplitude: 2 V
Lead Channel Setting Pacing Amplitude: 2.5 V
Lead Channel Setting Pacing Pulse Width: 0.5 ms
Lead Channel Setting Pacing Pulse Width: 0.9 ms
Lead Channel Setting Sensing Sensitivity: 2 mV
Pulse Gen Model: 3562
Pulse Gen Serial Number: 8279232

## 2024-02-18 NOTE — Progress Notes (Signed)
 Electrophysiology Office Note:    Date:  02/18/2024   ID:  DODD SCHMID, DOB 06/06/1946, MRN [id number]  PCP:  Duanne Butler DASEN, MD   [facility] Providers Cardiologist:  Lonni LITTIE Nanas, MD Electrophysiologist:  Eulas FORBES Furbish, MD     Referring MD: Duanne Butler DASEN, MD   History of Present Illness:    PHILLIPE CLEMON is a 78 y.o. male with a medical history significant for smoking, bronchiectasis, hypothyroidism, and atrial flutter referred for bradycardia.     He was diagnosed with atrial flutter incidentally at the time of maxillary antrostomy on 10/16/2022.  He had slow ventricular rates, 35 to 70 bpm.  He was unaware of his arrhythmia.   Eliquis  started and he underwent DC cardioversion.   ECG from May 30 shows Mobitz 1 AV block. He has been having significant fatigue. He is able to walk a mile and a half at a slow pace with his wife every morning. He has noticed intermittent ankle swelling over the past 1 months.  He underwent pacemaker placement on December 26, 2022. This was a complicated procedure. His left axillary vein was occluded, so we had to perform a right-sided implant.  Echocardiogram October 2024 showed an EF of 25 to 30%.  He underwent PCI of a 99% LAD stenosis in November 2024.  Repeat echocardiogram September 24, 2023 showed an EF of 25 to 30%     Today, he reports that he is doing Psychologist, clinical well.  He does not have any discomfort at the device site.  He does have significant fatigue and shortness of breath.    EKGs/Labs/Other Studies Reviewed Today:    Echocardiogram:  TTE 11/15/2022 EF 45-50%. Moderately dilated LA, severely dilated RA  TTE 09/24/2023 EF 25 to 30%   Monitors:   Stress testing:   Advanced imaging:  Cardiac MRI December 2024 ASD with small left-to-right shunt.  Moderate LV dilation.  EF 26%.  Septal dyskinesis consistent with RV pacing.  Subendocardial LGE in the mid to apical anteroseptum consistent with prior  infarct.  Cardiac catherization   EKG:   EKG Interpretation Date/Time:  Wednesday February 18 2024 12:19:48 EDT Ventricular Rate:  70 PR Interval:    QRS Duration:  156 QT Interval:  460 QTC Calculation: 496 R Axis:   -67  Text Interpretation: AV dual-paced rhythm with frequent ventricular-paced complexes Biventricular pacemaker detected When compared with ECG of 14-Nov-2023 04:57, Vent. rate has decreased BY   2 BPM Confirmed by Furbish Eulas [phone]) on 02/18/2024 12:30:45 PM     Physical Exam:    VS:  BP 118/60 (BP Location: Left Arm, Patient Position: Sitting, Cuff Size: Normal)   Pulse 70   Ht 6' 4 (1.93 m)   Wt 175 lb (79.4 kg)   SpO2 98%   BMI 21.30 kg/m     Wt Readings from Last 3 Encounters:  02/18/24 175 lb (79.4 kg)  02/12/24 177 lb (80.3 kg)  01/22/24 177 lb (80.3 kg)     GEN:  Well nourished, well developed in no acute distress CARDIAC: irregular rhythm, no murmurs, rubs, gallops The device site is normal -- no tenderness, edema, drainage, redness, threatened erosion.  RESPIRATORY:  Normal work of breathing MUSCULOSKELETAL: no edema    ASSESSMENT & PLAN:    Second degree AV block, complete heart block S/p pacemaker placement July, 2024 His device was interrogated today.  I reviewed the interrogation in detail.  See Paceart for report He is not device  dependent today  Atrial flutter Typical atrial flutter on ECG 11/07/2022 No recurrence on device interrogation RA is severely dilated Continue apixaban  5 mg May need ablation in the future  Frequent PVCs May be contributing to fatigue and cardiomyopathy PVC burden 12% on interrogation today Continue metoprolol  50 mg daily, mexilitine 200mg  PO BID He is concerned that the mexilitine may be causing a rash. He may stop this for 5 days  Secondary hypercoagulable state Continue apixaban  5 mg  Cardiomyopathy EF 25% I suspect this is due in large part to RV pacing Status post upgrade to CRT  pacemaker Repeat TTE is pending Continue metoprolol  XL 25, losartan  25    Signed, Eulas FORBES Furbish, MD  02/18/2024 2:14 PM    [facility]

## 2024-02-18 NOTE — Patient Instructions (Signed)
 Medication Instructions:  Your physician recommends that you continue on your current medications as directed. Please refer to the Current Medication list given to you today.  *If you need a refill on your cardiac medications before your next appointment, please call your pharmacy*  Lab Work: None ordered.  If you have labs (blood work) drawn today and your tests are completely normal, you will receive your results only by: MyChart Message (if you have MyChart) OR A paper copy in the mail If you have any lab test that is abnormal or we need to change your treatment, we will call you to review the results.  Testing/Procedures: None ordered.   Follow-Up: At [facility], you and your health needs are our priority.  As part of our continuing mission to provide you with exceptional heart care, our providers are all part of one team.  This team includes your primary Cardiologist (physician) and Advanced Practice Providers or APPs (Physician Assistants and Nurse Practitioners) who all work together to provide you with the care you need, when you need it.  Your next appointment:   3 months with Dr Mealor

## 2024-02-23 DIAGNOSIS — L570 Actinic keratosis: Secondary | ICD-10-CM | POA: Diagnosis not present

## 2024-02-23 DIAGNOSIS — D0462 Carcinoma in situ of skin of left upper limb, including shoulder: Secondary | ICD-10-CM | POA: Diagnosis not present

## 2024-02-26 ENCOUNTER — Ambulatory Visit (HOSPITAL_COMMUNITY)
Admission: RE | Admit: 2024-02-26 | Discharge: 2024-02-26 | Disposition: A | Discharge status: Home / Self Care | Source: Ambulatory Visit | Attending: Cardiology | Admitting: Cardiology

## 2024-02-26 DIAGNOSIS — I5042 Chronic combined systolic (congestive) and diastolic (congestive) heart failure: Secondary | ICD-10-CM | POA: Diagnosis not present

## 2024-02-26 LAB — ECHOCARDIOGRAM COMPLETE
Area-P 1/2: 2.44 cm2
MV M vel: 4.7 m/s
MV Peak grad: 88.4 mmHg
S' Lateral: 4.4 cm

## 2024-03-01 ENCOUNTER — Other Ambulatory Visit: Payer: Self-pay | Admitting: *Deleted

## 2024-03-01 NOTE — Telephone Encounter (Signed)
 Called and made patient aware of the results per Dr. Kate heart pumping function has improved, now moderately red. Understanding verbalized

## 2024-03-03 ENCOUNTER — Encounter: Payer: Self-pay | Admitting: Family Medicine

## 2024-03-03 ENCOUNTER — Ambulatory Visit (INDEPENDENT_AMBULATORY_CARE_PROVIDER_SITE_OTHER): Admitting: Family Medicine

## 2024-03-03 VITALS — BP 133/79 | HR 75 | Ht 71.0 in | Wt 173.4 lb

## 2024-03-03 DIAGNOSIS — L989 Disorder of the skin and subcutaneous tissue, unspecified: Secondary | ICD-10-CM

## 2024-03-03 DIAGNOSIS — R1310 Dysphagia, unspecified: Secondary | ICD-10-CM

## 2024-03-03 DIAGNOSIS — Z7689 Persons encountering health services in other specified circumstances: Secondary | ICD-10-CM | POA: Diagnosis not present

## 2024-03-03 DIAGNOSIS — Z1389 Encounter for screening for other disorder: Secondary | ICD-10-CM | POA: Diagnosis not present

## 2024-03-03 LAB — POCT URINALYSIS DIP (CLINITEK)
Bilirubin, UA: NEGATIVE
Blood, UA: NEGATIVE
Glucose, UA: NEGATIVE mg/dL
Ketones, POC UA: NEGATIVE mg/dL
Leukocytes, UA: NEGATIVE
Nitrite, UA: NEGATIVE
Spec Grav, UA: >=1.03 — AB (ref 1.010–1.025)
Urobilinogen, UA: 0.2 U/dL
pH, UA: 6 (ref 5.0–8.0)

## 2024-03-03 NOTE — Progress Notes (Unsigned)
 New Patient Office Visit  Subjective    Patient ID: Dustin Maynard, male    DOB: July 16, 1945  Age: 78 y.o. MRN: [id number]  CC:  Chief Complaint  Patient presents with   Establish Care    Pt is concerned about his sinuses    HPI Dustin Maynard presents to establish care and for complaint of sinus issues. He had recent xrays per dentist. Patient reports intermittent difficulty swallowing.    Outpatient Encounter Medications as of 03/03/2024  Medication Sig   amLODipine (NORVASC) 10 MG tablet    Ascorbic Acid (VITAMIN C) 1000 MG tablet Take 500 mg by mouth daily.   Flaxseed, Linseed, (FLAX SEEDS PO) Take by mouth.   GARLIC PO Take by mouth.   lisinopril (PRINIVIL,ZESTRIL) 10 MG tablet    niacin 500 MG tablet Take 500 mg by mouth at bedtime.   Multiple Vitamins-Minerals (CENTRUM SILVER PO) Take by mouth.   No facility-administered encounter medications on file as of 03/03/2024.    Past Medical History:  Diagnosis Date   Hypertension     No past surgical history on file.  Family History  Problem Relation Age of Onset   Heart disease Mother     Social History   Socioeconomic History   Marital status: Divorced    Spouse name: Not on file   Number of children: Not on file   Years of education: Not on file   Highest education level: Not on file  Occupational History   Not on file  Tobacco Use   Smoking status: Former    Current packs/day: 0.00    Types: Cigarettes    Quit date: 07/21/2010    Years since quitting: 13.6   Smokeless tobacco: Never  Vaping Use   Vaping status: Never Used  Substance and Sexual Activity   Alcohol use: No   Drug use: No   Sexual activity: Not on file  Other Topics Concern   Not on file  Social History Narrative   Not on file   Social Drivers of Health   Financial Resource Strain: Low Risk  (03/03/2024)   Overall Financial Resource Strain (CARDIA)    Difficulty of Paying Living Expenses: Not very hard  Food Insecurity: No Food  Insecurity (03/03/2024)   Hunger Vital Sign    Worried About Running Out of Food in the Last Year: Never true    Ran Out of Food in the Last Year: Never true  Transportation Needs: No Transportation Needs (03/03/2024)   PRAPARE - Administrator, Civil Service (Medical): No    Lack of Transportation (Non-Medical): No  Physical Activity: Insufficiently Active (03/03/2024)   Exercise Vital Sign    Days of Exercise per Week: 5 days    Minutes of Exercise per Session: 20 min  Stress: No Stress Concern Present (03/03/2024)   [employer] of Occupational Health - Occupational Stress Questionnaire    Feeling of Stress: Not at all  Social Connections: Socially Isolated (03/03/2024)   Social Connection and Isolation Panel    Frequency of Communication with Friends and Family: More than three times a week    Frequency of Social Gatherings with Friends and Family: Once a week    Attends Religious Services: Never    Database administrator or Organizations: No    Attends Banker Meetings: Never    Marital Status: Divorced  Catering manager Violence: Not At Risk (03/03/2024)   Humiliation, Afraid, Rape, and Kick questionnaire  Fear of Current or Ex-Partner: No    Emotionally Abused: No    Physically Abused: No    Sexually Abused: No    Review of Systems  All other systems reviewed and are negative.       Objective   BP 133/79   Pulse 75   Ht 5' 11 (1.803 m)   Wt 173 lb 6.4 oz (78.7 kg)   SpO2 99%   BMI 24.18 kg/m   Physical Exam Vitals and nursing note reviewed.  Constitutional:      General: He is not in acute distress. HENT:     Mouth/Throat:     Mouth: Mucous membranes are moist.     Pharynx: No oropharyngeal exudate.  Cardiovascular:     Rate and Rhythm: Normal rate and regular rhythm.  Pulmonary:     Effort: Pulmonary effort is normal.     Breath sounds: Normal breath sounds.  Abdominal:     Palpations: Abdomen is soft.     Tenderness:  There is no abdominal tenderness.  Neurological:     General: No focal deficit present.     Mental Status: He is alert and oriented to person, place, and time.         Assessment & Plan:   Skin lesion -     Ambulatory referral to Dermatology  Dysphagia, unspecified type -     SLP modified barium swallow; Future  Screening for blood or protein in urine -     POCT URINALYSIS DIP (CLINITEK)  Encounter to establish care     No follow-ups on file.   Tanda Raguel SQUIBB, MD

## 2024-03-04 ENCOUNTER — Telehealth: Payer: Self-pay

## 2024-03-04 ENCOUNTER — Other Ambulatory Visit

## 2024-03-04 ENCOUNTER — Telehealth (HOSPITAL_COMMUNITY): Payer: Self-pay | Admitting: *Deleted

## 2024-03-04 DIAGNOSIS — I4892 Unspecified atrial flutter: Secondary | ICD-10-CM

## 2024-03-04 DIAGNOSIS — E039 Hypothyroidism, unspecified: Secondary | ICD-10-CM

## 2024-03-04 DIAGNOSIS — E785 Hyperlipidemia, unspecified: Secondary | ICD-10-CM

## 2024-03-04 DIAGNOSIS — Z0000 Encounter for general adult medical examination without abnormal findings: Secondary | ICD-10-CM | POA: Diagnosis not present

## 2024-03-04 DIAGNOSIS — I5022 Chronic systolic (congestive) heart failure: Secondary | ICD-10-CM | POA: Diagnosis not present

## 2024-03-04 DIAGNOSIS — R972 Elevated prostate specific antigen [PSA]: Secondary | ICD-10-CM | POA: Diagnosis not present

## 2024-03-04 NOTE — Telephone Encounter (Signed)
 Attempted to contact patient to schedule OP MBS. Left VM @ [phone] requesting a call back. RKEEL

## 2024-03-04 NOTE — Telephone Encounter (Signed)
 LVM to schedule orthotic fitting/ pu

## 2024-03-05 ENCOUNTER — Ambulatory Visit: Payer: Self-pay | Admitting: Family Medicine

## 2024-03-05 ENCOUNTER — Other Ambulatory Visit (HOSPITAL_COMMUNITY): Payer: Self-pay | Admitting: *Deleted

## 2024-03-05 ENCOUNTER — Encounter: Payer: Self-pay | Admitting: Family Medicine

## 2024-03-05 DIAGNOSIS — R1310 Dysphagia, unspecified: Secondary | ICD-10-CM

## 2024-03-05 DIAGNOSIS — R059 Cough, unspecified: Secondary | ICD-10-CM

## 2024-03-05 LAB — CBC WITH DIFFERENTIAL/PLATELET
Absolute Lymphocytes: 1490 {cells}/uL (ref 850–3900)
Absolute Monocytes: 633 {cells}/uL (ref 200–950)
Basophils Absolute: 62 {cells}/uL (ref 0–200)
Basophils Relative: 1.1 %
Eosinophils Absolute: 62 {cells}/uL (ref 15–500)
Eosinophils Relative: 1.1 %
HCT: 49.9 % (ref 38.5–50.0)
Hemoglobin: 16.7 g/dL (ref 13.2–17.1)
MCH: 31.3 pg (ref 27.0–33.0)
MCHC: 33.5 g/dL (ref 32.0–36.0)
MCV: 93.4 fL (ref 80.0–100.0)
MPV: 11.9 fL (ref 7.5–12.5)
Monocytes Relative: 11.3 %
Neutro Abs: 3354 {cells}/uL (ref 1500–7800)
Neutrophils Relative %: 59.9 %
Platelets: 164 Thousand/uL (ref 140–400)
RBC: 5.34 Million/uL (ref 4.20–5.80)
RDW: 12.5 % (ref 11.0–15.0)
Total Lymphocyte: 26.6 %
WBC: 5.6 Thousand/uL (ref 3.8–10.8)

## 2024-03-05 LAB — COMPLETE METABOLIC PANEL WITHOUT GFR
AG Ratio: 1.6 (calc) (ref 1.0–2.5)
ALT: 15 U/L (ref 9–46)
AST: 18 U/L (ref 10–35)
Albumin: 4.4 g/dL (ref 3.6–5.1)
Alkaline phosphatase (APISO): 102 U/L (ref 35–144)
BUN: 17 mg/dL (ref 7–25)
CO2: 28 mmol/L (ref 20–32)
Calcium: 9.4 mg/dL (ref 8.6–10.3)
Chloride: 105 mmol/L (ref 98–110)
Creat: 1.03 mg/dL (ref 0.70–1.28)
Globulin: 2.7 g/dL (ref 1.9–3.7)
Glucose, Bld: 97 mg/dL (ref 65–99)
Potassium: 4.8 mmol/L (ref 3.5–5.3)
Sodium: 141 mmol/L (ref 135–146)
Total Bilirubin: 1 mg/dL (ref 0.2–1.2)
Total Protein: 7.1 g/dL (ref 6.1–8.1)

## 2024-03-05 LAB — PSA: PSA: 1.51 ng/mL (ref <=4.00)

## 2024-03-05 LAB — LIPID PANEL
Cholesterol: 133 mg/dL (ref <200)
HDL: 48 mg/dL (ref >=40)
LDL Cholesterol (Calc): 67 mg/dL
Non-HDL Cholesterol (Calc): 85 mg/dL (ref <130)
Total CHOL/HDL Ratio: 2.8 (calc) (ref <5.0)
Triglycerides: 101 mg/dL (ref <150)

## 2024-03-05 LAB — TSH: TSH: 1.11 m[IU]/L (ref 0.40–4.50)

## 2024-03-05 NOTE — Telephone Encounter (Signed)
 Called pt gave results and advice. Pt stated understanding and appreciation

## 2024-03-11 ENCOUNTER — Ambulatory Visit: Payer: Medicare Other

## 2024-03-11 ENCOUNTER — Encounter: Payer: Self-pay | Admitting: Family Medicine

## 2024-03-11 ENCOUNTER — Ambulatory Visit

## 2024-03-11 ENCOUNTER — Ambulatory Visit: Admitting: Family Medicine

## 2024-03-11 VITALS — BP 114/56 | HR 75 | Temp 97.7°F | Ht 76.0 in | Wt 179.0 lb

## 2024-03-11 DIAGNOSIS — E039 Hypothyroidism, unspecified: Secondary | ICD-10-CM

## 2024-03-11 DIAGNOSIS — Z0001 Encounter for general adult medical examination with abnormal findings: Secondary | ICD-10-CM | POA: Diagnosis not present

## 2024-03-11 DIAGNOSIS — J479 Bronchiectasis, uncomplicated: Secondary | ICD-10-CM

## 2024-03-11 DIAGNOSIS — Z0000 Encounter for general adult medical examination without abnormal findings: Secondary | ICD-10-CM

## 2024-03-11 DIAGNOSIS — I5022 Chronic systolic (congestive) heart failure: Secondary | ICD-10-CM | POA: Diagnosis not present

## 2024-03-11 DIAGNOSIS — I4892 Unspecified atrial flutter: Secondary | ICD-10-CM | POA: Diagnosis not present

## 2024-03-11 MED ORDER — FLUTICASONE PROPIONATE 50 MCG/ACT NA SUSP: 2.0000 | Freq: Every day | NASAL | 6 refills | Status: AC

## 2024-03-11 NOTE — Progress Notes (Signed)
 Subjective:    Patient ID: Carlos Choi, male    DOB: [birth year], 78 y.o.   MRN: [id number]  HPI Patient is here today for complete physical exam.  He is due for his flu shot and his COVID-vaccine.  He prefers to get these at his pharmacy.  Colonoscopy is up-to-date.  His next colonoscopy will be due in 2027.  PSA was recently checked and was outstanding.  Patient does have congestive heart failure however his most recent echocardiogram showed an ejection fraction of 35 to 40% up from 20% earlier this year.  He still reports fatigue.  He is currently on metoprolol  as well as losartan .  His cardiologist is concerned that the PVC burden may be contributing to his fatigue.  Patient is not currently on Entresto .  I have avoided this due to hypotension.  Otherwise he is doing well with no concerns  Lab on 03/04/2024  Component Date Value Ref Range Status   WBC 03/04/2024 5.6  3.8 - 10.8 Thousand/uL Final   RBC 03/04/2024 5.34  4.20 - 5.80 Million/uL Final   Hemoglobin 03/04/2024 16.7  13.2 - 17.1 g/dL Final   HCT 90/81/7974 49.9  38.5 - 50.0 % Final   MCV 03/04/2024 93.4  80.0 - 100.0 fL Final   MCH 03/04/2024 31.3  27.0 - 33.0 pg Final   MCHC 03/04/2024 33.5  32.0 - 36.0 g/dL Final   Comment: For adults, a slight decrease in the calculated MCHC value (in the range of 30 to 32 g/dL) is most likely not clinically significant; however, it should be interpreted with caution in correlation with other red cell parameters and the patient's clinical condition.    RDW 03/04/2024 12.5  11.0 - 15.0 % Final   Platelets 03/04/2024 164  140 - 400 Thousand/uL Final   MPV 03/04/2024 11.9  7.5 - 12.5 fL Final   Neutro Abs 03/04/2024 3,354  1,500 - 7,800 cells/uL Final   Absolute Lymphocytes 03/04/2024 1,490  850 - 3,900 cells/uL Final   Absolute Monocytes 03/04/2024 633  200 - 950 cells/uL Final   Eosinophils Absolute 03/04/2024 62  15 - 500 cells/uL Final   Basophils Absolute 03/04/2024 62  0 - 200  cells/uL Final   Neutrophils Relative % 03/04/2024 59.9  % Final   Total Lymphocyte 03/04/2024 26.6  % Final   Monocytes Relative 03/04/2024 11.3  % Final   Eosinophils Relative 03/04/2024 1.1  % Final   Basophils Relative 03/04/2024 1.1  % Final   Glucose, Bld 03/04/2024 97  65 - 99 mg/dL Final   Comment: .            Fasting reference interval .    BUN 03/04/2024 17  7 - 25 mg/dL Final   Creat 90/81/7974 1.03  0.70 - 1.28 mg/dL Final   BUN/Creatinine Ratio 03/04/2024 SEE NOTE:  6 - 22 (calc) Final   Comment:    Not Reported: BUN and Creatinine are within    reference range. .    Sodium 03/04/2024 141  135 - 146 mmol/L Final   Potassium 03/04/2024 4.8  3.5 - 5.3 mmol/L Final   Chloride 03/04/2024 105  98 - 110 mmol/L Final   CO2 03/04/2024 28  20 - 32 mmol/L Final   Calcium  03/04/2024 9.4  8.6 - 10.3 mg/dL Final   Total Protein 90/81/7974 7.1  6.1 - 8.1 g/dL Final   Albumin 90/81/7974 4.4  3.6 - 5.1 g/dL Final   Globulin 90/81/7974 2.7  1.9 -  3.7 g/dL (calc) Final   AG Ratio 03/04/2024 1.6  1.0 - 2.5 (calc) Final   Total Bilirubin 03/04/2024 1.0  0.2 - 1.2 mg/dL Final   Alkaline phosphatase (APISO) 03/04/2024 102  35 - 144 U/L Final   AST 03/04/2024 18  10 - 35 U/L Final   ALT 03/04/2024 15  9 - 46 U/L Final   Cholesterol 03/04/2024 133  <200 mg/dL Final   HDL 90/81/7974 48  > OR = 40 mg/dL Final   Triglycerides 90/81/7974 101  <150 mg/dL Final   LDL Cholesterol (Calc) 03/04/2024 67  mg/dL (calc) Final   Comment: Reference range: <100 . Desirable range <100 mg/dL for primary prevention;   <70 mg/dL for patients with CHD or diabetic patients  with > or = 2 CHD risk factors. SABRA LDL-C is now calculated using the Martin-Hopkins  calculation, which is a validated novel method providing  better accuracy than the Friedewald equation in the  estimation of LDL-C.  Gladis APPLETHWAITE et al. SANDREA. 7986;689(80): 2061-2068  (http://education.[facility].com/faq/FAQ164)    Total  CHOL/HDL Ratio 03/04/2024 2.8  <4.9 (calc) Final   Non-HDL Cholesterol (Calc) 03/04/2024 85  <130 mg/dL (calc) Final   Comment: For patients with diabetes plus 1 major ASCVD risk  factor, treating to a non-HDL-C goal of <100 mg/dL  (LDL-C of <29 mg/dL) is considered a therapeutic  option.    PSA 03/04/2024 1.51  < OR = 4.00 ng/mL Final   Comment: The total PSA value from this assay system is  standardized against the WHO standard. The test  result will be approximately 20% lower when compared  to the equimolar-standardized total PSA (Beckman  Coulter). Comparison of serial PSA results should be  interpreted with this fact in mind. . This test was performed using the Siemens  chemiluminescent method. Values obtained from  different assay methods cannot be used interchangeably. PSA levels, regardless of value, should not be interpreted as absolute evidence of the presence or absence of disease.    TSH 03/04/2024 1.11  0.40 - 4.50 mIU/L Final  Hospital Outpatient Visit on 02/26/2024  Component Date Value Ref Range Status   Area-P 1/2 02/26/2024 2.44  cm2 Final   S' Lateral 02/26/2024 4.40  cm Final   MV M vel 02/26/2024 4.70  m/s Final   MV Peak grad 02/26/2024 88.4  mmHg Final   Est EF 02/26/2024 35 - 40%   Final  Office Visit on 02/18/2024  Component Date Value Ref Range Status   Date Time Interrogation Session 02/18/2024 [id number]   Final   Pulse Generator Manufacturer 02/18/2024 [facility]   Final   Pulse Gen Model 02/18/2024 3562 Marelyn Coward MP   Final   Pulse Gen Serial Number 02/18/2024 [id number]   Final   Clinic Name 02/18/2024 [facility]   Final   Implantable Pulse Generator Type 02/18/2024 Cardiac Resynch Therapy Pacemaker   Final   Implantable Pulse Generator Implan* 02/18/2024 [id number]   Final   Implantable Lead Manufacturer 02/18/2024 [facility]   Final   Implantable Lead Model 02/18/2024 [id number] Quartet   Final   Implantable Lead Serial Number 02/18/2024 [id number]    Final   Implantable Lead Implant Date 02/18/2024 [id number]   Final   Implantable Lead Location Detail 1 02/18/2024 UNKNOWN   Final   Implantable Lead Location 02/18/2024 246141   Final   Implantable Lead Connection Status 02/18/2024 246014   Final   Implantable Lead Manufacturer 02/18/2024 OTHER   Final   Implantable Lead Model  02/18/2024 [id number]/41 [id number]   Final   Implantable Lead Serial Number 02/18/2024 [id number]   Final   Implantable Lead Implant Date 02/18/2024 [id number]   Final   Implantable Lead Location Detail 1 02/18/2024 UNKNOWN   Final   Implantable Lead Location 02/18/2024 246140   Final   Implantable Lead Connection Status 02/18/2024 246014   Final   Implantable Lead Manufacturer 02/18/2024 OTHER   Final   Implantable Lead Model 02/18/2024 [id number]/41 [id number]   Final   Implantable Lead Serial Number 02/18/2024 [id number]   Final   Implantable Lead Implant Date 02/18/2024 [id number]   Final   Implantable Lead Location Detail 1 02/18/2024 UNKNOWN   Final   Implantable Lead Location 02/18/2024 246139   Final   Implantable Lead Connection Status 02/18/2024 246014   Final   Lead Channel Setting Sensing Sensi* 02/18/2024 2.0  mV Final   Lead Channel Setting Pacing Amplit* 02/18/2024 2.5  V Final   Lead Channel Setting Pacing Pulse * 02/18/2024 0.5  ms Final   Lead Channel Setting Pacing Amplit* 02/18/2024 2.0  V Final   Lead Channel Setting Pacing Pulse * 02/18/2024 0.9  ms Final   Lead Channel Setting Pacing Amplit* 02/18/2024 2.0  V Final   Lead Channel Setting Pacing Captur* 02/18/2024 Adaptive Capture   Final   Lead Channel Impedance Value 02/18/2024 462.5  ohm Final   Lead Channel Sensing Intrinsic Amp* 02/18/2024 3.9  mV Final   Lead Channel Pacing Threshold Ampl* 02/18/2024 0.75  V Final   Lead Channel Pacing Threshold Puls* 02/18/2024 0.5  ms Final   Lead Channel Pacing Threshold Ampl* 02/18/2024 0.75  V Final   Lead Channel Pacing Threshold Puls* 02/18/2024 0.5  ms Final    Lead Channel Impedance Value 02/18/2024 525.0  ohm Final   Lead Channel Sensing Intrinsic Amp* 02/18/2024 7.2  mV Final   Lead Channel Pacing Threshold Ampl* 02/18/2024 0.75  V Final   Lead Channel Pacing Threshold Puls* 02/18/2024 0.5  ms Final   Lead Channel Pacing Threshold Ampl* 02/18/2024 0.75  V Final   Lead Channel Pacing Threshold Puls* 02/18/2024 0.5  ms Final   Lead Channel Impedance Value 02/18/2024 1,025.0  ohm Final   Lead Channel Pacing Threshold Ampl* 02/18/2024 0.75  V Final   Lead Channel Pacing Threshold Puls* 02/18/2024 0.9  ms Final   Lead Channel Pacing Threshold Ampl* 02/18/2024 0.75  V Final   Lead Channel Pacing Threshold Puls* 02/18/2024 0.9  ms Final   Battery Status 02/18/2024 Unknown   Final   Battery Remaining Longevity 02/18/2024 78  mo Final   Battery Voltage 02/18/2024 2.98  V Final   Brady Statistic RA Percent Paced 02/18/2024 96.0  % Final   Brady Statistic RV Percent Paced 02/18/2024 88.0  % Final   Eval Rhythm 02/18/2024 AS 39/VP 30 with escape   Final     Past Medical History:  Diagnosis Date   Allergy    Anxiety    Arthritis    mild ankles    ASD (atrial septal defect)    ASD (small secundum ASD with predominantly left-to-right shunting) 11/15/22 TTE   Atrial flutter (HCC)    s/p DCCV 11/04/22   Bronchiectasis (HCC)    Cataract    removed left eye, forming right eye    Clotting disorder    Bruise easily & bleeding from cuts normally stops quickly   Diverticulosis    Dysrhythmia    2nd degree AV block, bradycardia s/p PPM 12/26/22;  PVCs   GERD (gastroesophageal reflux disease)    Hemorrhoids    History of kidney stones    Hypertension    Hypothyroid    Presence of permanent cardiac pacemaker 12/26/2022   St. Jude/Abbott   Rectal bleeding    due to hemorrhoid    Tubular adenoma of colon 2015   Past Surgical History:  Procedure Laterality Date   BIV UPGRADE N/A 11/13/2023   Procedure: BIV UPGRADE;  Surgeon: Mealor, Eulas BRAVO, MD;   Location: [facility] INVASIVE CV LAB;  Service: Cardiovascular;  Laterality: N/A;   CARDIOVERSION N/A 11/14/2022   Procedure: CARDIOVERSION;  Surgeon: Jeffrie Oneil BROCKS, MD;  Location: [facility] CV [facility];  Service: Cardiovascular;  Laterality: N/A;   CARDIOVERSION N/A 07/15/2023   Procedure: CARDIOVERSION;  Surgeon: Raford Riggs, MD;  Location: [facility] INVASIVE CV LAB;  Service: Cardiovascular;  Laterality: N/A;   CATARACT EXTRACTION Left    COLONOSCOPY     CORONARY STENT INTERVENTION N/A 05/02/2023   Procedure: CORONARY STENT INTERVENTION;  Surgeon: Swaziland, Peter M, MD;  Location: [facility] INVASIVE CV LAB;  Service: Cardiovascular;  Laterality: N/A;   CYSTOSCOPY/URETEROSCOPY/HOLMIUM LASER/STENT PLACEMENT Left 03/18/2023   Procedure: CYSTOSCOPY LEFT URETEROSCOPY/STENT PLACEMENT;  Surgeon: Watt Rush, MD;  Location: [facility] ORS;  Service: Urology;  Laterality: Left;  1 HR FOR CASE   EYE SURGERY     Cataract repaired 08/26/2017   HERNIA REPAIR Bilateral    Over ten years ago per patient. He was not sure of the date.   LEAD INSERTION N/A 11/13/2023   Procedure: LEAD INSERTION;  Surgeon: Nancey Eulas BRAVO, MD;  Location: [facility] INV[facility] CV [facility];  Service: Cardiovascular;  Laterality: N/A;   NASAL SINUS SURGERY Left 02/05/2023   Procedure: ENDOSCOPIC MAXILLARY ANTROSTOMY WITH TISSUE REMOVAL;  Surgeon: Jesus Oliphant, MD;  Location: [facility] OR;  Service: ENT;  Laterality: Left;   PACEMAKER IMPLANT N/A 12/26/2022   Procedure: PACEMAKER IMPLANT;  Surgeon: Nancey Eulas BRAVO, MD;  Location: [facility] CV [facility];  Service: Cardiovascular;  Laterality: N/A;   POLYPECTOMY     RIGHT/LEFT HEART CATH AND CORONARY ANGIOGRAPHY N/A 05/02/2023   Procedure: RIGHT/LEFT HEART CATH AND CORONARY ANGIOGRAPHY;  Surgeon: Swaziland, Peter M, MD;  Location: [facility] INVASIVE CV LAB;  Service: Cardiovascular;  Laterality: N/A;   ROTATOR CUFF REPAIR     both shoulders - patient does not remember the exact date   Current Outpatient Medications on File Prior to  Visit  Medication Sig Dispense Refill   acetaminophen  (TYLENOL ) 650 MG CR tablet Take 1,300 mg by mouth every 8 (eight) hours as needed for pain.     ALPRAZolam  (XANAX ) 0.25 MG tablet Take 1 tablet (0.25 mg total) by mouth daily as needed for anxiety. 90 tablet 0   aspirin  EC 81 MG tablet Take 81 mg by mouth daily. Swallow whole.     atorvastatin  (LIPITOR) 40 MG tablet Take 1 tablet (40 mg total) by mouth daily. 90 tablet 3   ELIQUIS  5 MG TABS tablet TAKE ONE TABLET BY MOUTH 2 TIMES A DAY 60 tablet 5   ipratropium (ATROVENT) 0.03 % nasal spray Place 2 sprays into both nostrils daily as needed for rhinitis.     JARDIANCE  10 MG TABS tablet Take 1 tablet (10 mg total) by mouth daily before breakfast. 90 tablet 3   Lactobacillus-Inulin (CULTURELLE DIGESTIVE HEALTH PO) Take 1 capsule by mouth daily with lunch.     levothyroxine  (SYNTHROID ) 100 MCG tablet TAKE 1 TABLET BY MOUTH DAILY 90 tablet 3  loratadine  (CLARITIN ) 10 MG tablet Take 10 mg by mouth daily with breakfast.     losartan  (COZAAR ) 25 MG tablet TAKE ONE TABLET BY MOUTH EVERY DAY 90 tablet 3   metoprolol  succinate (TOPROL -XL) 50 MG 24 hr tablet Take 1 tablet (50 mg total) by mouth daily. Take with or immediately following a meal. 90 tablet 2   mexiletine (MEXITIL ) 200 MG capsule Take 1 capsule (200 mg total) by mouth 2 (two) times daily. 60 capsule 6   Multiple Vitamins-Minerals (CENTRUM SILVER PO) Take 1 tablet by mouth daily with lunch.     nitroGLYCERIN  (NITROSTAT ) 0.4 MG SL tablet Place 1 tablet (0.4 mg total) under the tongue every 5 (five) minutes as needed for chest pain. 25 tablet 3   Omega-3 Fatty Acids (FISH OIL) 1000 MG CAPS Take 2,000 mg by mouth daily with lunch.     pantoprazole  (PROTONIX ) 40 MG tablet Take 1 tablet (40 mg total) by mouth daily. 90 tablet 3   silodosin (RAPAFLO) 8 MG CAPS capsule Take 8 mg by mouth daily with supper.     Sod Bicarb-K Bicarb-Citric Acd (ALKA-SELTZER GOLD) [phone] MG TBEF Take 2 tablets by  mouth daily as needed (upset stomach/indigestion.).     Sodium Chloride -Sodium Bicarb (NETI POT SINUS WASH NA) Place 1 Dose into the nose 2 (two) times daily.     spironolactone  (ALDACTONE ) 25 MG tablet Take 0.5 tablets (12.5 mg total) by mouth every other day. 24 tablet 2   No current facility-administered medications on file prior to visit.   No Known Allergies Social History   Socioeconomic History   Marital status: Married    Spouse name: Devere BROCKS.   Number of children: 2   Years of education: Not on file   Highest education level: Bachelor's degree (e.g., BA, AB, BS)  Occupational History   Not on file  Tobacco Use   Smoking status: Former    Current packs/day: 0.00    Average packs/day: 0.5 packs/day for 6.0 years (3.0 ttl pk-yrs)    Types: Cigarettes    Start date: 06/17/1973    Quit date: 06/18/1979    Years since quitting: 44.7   Smokeless tobacco: Never   Tobacco comments:    Former smoker 01/22/24  Vaping Use   Vaping status: Never Used  Substance and Sexual Activity   Alcohol use: Yes    Comment: Very limited social drinker   Drug use: No   Sexual activity: Yes    Birth control/protection: None  Other Topics Concern   Not on file  Social History Narrative   Not on file   Social Drivers of Health   Financial Resource Strain: Low Risk  (03/04/2024)   Overall Financial Resource Strain (CARDIA)    Difficulty of Paying Living Expenses: Not hard at all  Food Insecurity: No Food Insecurity (03/04/2024)   Hunger Vital Sign    Worried About Running Out of Food in the Last Year: Never true    Ran Out of Food in the Last Year: Never true  Transportation Needs: No Transportation Needs (03/04/2024)   PRAPARE - Administrator, Civil Service (Medical): No    Lack of Transportation (Non-Medical): No  Physical Activity: Sufficiently Active (03/04/2024)   Exercise Vital Sign    Days of Exercise per Week: 6 days    Minutes of Exercise per Session: 30 min  Stress:  No Stress Concern Present (03/04/2024)   [employer] of Occupational Health - Occupational Stress Questionnaire  Feeling of Stress: Only a little  Social Connections: Socially Integrated (03/04/2024)   Social Connection and Isolation Panel    Frequency of Communication with Friends and Family: More than three times a week    Frequency of Social Gatherings with Friends and Family: Three times a week    Attends Religious Services: More than 4 times per year    Active Member of Clubs or Organizations: Yes    Attends Banker Meetings: More than 4 times per year    Marital Status: Married  Catering manager Violence: Not At Risk (11/13/2023)   Humiliation, Afraid, Rape, and Kick questionnaire    Fear of Current or Ex-Partner: No    Emotionally Abused: No    Physically Abused: No    Sexually Abused: No   Family History  Problem Relation Age of Onset   Aneurysm Mother    Alzheimer's disease Father    Cancer Sister        ovarian   Stroke Brother    Hypertension Brother    Colon cancer Neg Hx    Esophageal cancer Neg Hx    Stomach cancer Neg Hx    Rectal cancer Neg Hx    Colon polyps Neg Hx      Review of Systems     Objective:   Physical Exam Vitals reviewed.  Constitutional:      General: He is not in acute distress.    Appearance: Normal appearance. He is normal weight. He is not ill-appearing, toxic-appearing or diaphoretic.  HENT:     Head: Normocephalic and atraumatic.     Right Ear: Tympanic membrane, ear canal and external ear normal. There is no impacted cerumen.     Left Ear: Tympanic membrane, ear canal and external ear normal. There is no impacted cerumen.     Nose: Nose normal. No congestion or rhinorrhea.     Mouth/Throat:     Mouth: Mucous membranes are moist.     Pharynx: Oropharynx is clear. No oropharyngeal exudate or posterior oropharyngeal erythema.  Eyes:     General: No scleral icterus.       Right eye: No discharge.        Left  eye: No discharge.     Extraocular Movements: Extraocular movements intact.     Conjunctiva/sclera: Conjunctivae normal.     Pupils: Pupils are equal, round, and reactive to light.  Neck:     Vascular: No carotid bruit.  Cardiovascular:     Rate and Rhythm: Regular rhythm. Bradycardia present.     Pulses: Normal pulses.     Heart sounds: Normal heart sounds. No murmur heard.    No friction rub. No gallop.  Pulmonary:     Effort: Pulmonary effort is normal. No respiratory distress.     Breath sounds: Normal breath sounds. No wheezing, rhonchi or rales.  Abdominal:     General: Abdomen is flat. Bowel sounds are normal. There is no distension.     Palpations: Abdomen is soft. There is no mass.     Tenderness: There is no abdominal tenderness. There is no guarding or rebound.     Hernia: No hernia is present.  Musculoskeletal:        General: No swelling, tenderness or deformity.     Cervical back: Normal range of motion and neck supple. No rigidity or tenderness.     Right lower leg: No edema.     Left lower leg: No edema.  Lymphadenopathy:  Cervical: No cervical adenopathy.  Skin:    General: Skin is warm and dry.     Coloration: Skin is not jaundiced or pale.     Findings: No bruising, erythema, lesion or rash.  Neurological:     General: No focal deficit present.     Mental Status: He is alert and oriented to person, place, and time. Mental status is at baseline.     Cranial Nerves: No cranial nerve deficit.     Sensory: No sensory deficit.     Motor: No weakness.     Coordination: Coordination normal.     Gait: Gait normal.     Deep Tendon Reflexes: Reflexes normal.  Psychiatric:        Mood and Affect: Mood normal.        Behavior: Behavior normal.        Thought Content: Thought content normal.        Judgment: Judgment normal.        Assessment & Plan:  Encounter for Medicare annual wellness exam  Hypothyroidism, unspecified type  Chronic systolic  congestive heart failure (HCC)  Atrial flutter, unspecified type (HCC)  Bronchiectasis without complication (HCC)  General medical exam Patient's physical exam today is completely normal.  His TSH is outstanding.  No changes necessary to his levothyroxine  dosage.  I did recommend trying Flonase  for nasal drainage and postnasal drip which exacerbates his bronchiectasis.  Patient would like to get his flu shot and his COVID shot at his pharmacy.  His PSA and colonoscopy are up-to-date.  His cholesterol is outstanding.  He is appropriately anticoagulated with Eliquis .  I did suggest possibly trying to switch his losartan  to Entresto .  I asked the patient to check his blood pressure every day for 1 week.  If his blood pressure suggest that he can tolerate switching losartan  to Entresto  I would run this by his cardiologist and if they approve make the switch.

## 2024-03-11 NOTE — Progress Notes (Signed)
 Subjective:   Carlos Choi is a 78 y.o. male who presents for Medicare Annual/Subsequent preventive examination.  Visit Complete: In person  Patient Medicare AWV questionnaire was completed by the patient on 03/11/2024; I have confirmed that all information answered by patient is correct and no changes since this date.        Objective:    There were no vitals filed for this visit. There is no height or weight on file to calculate BMI.     11/13/2023   12:59 PM 07/15/2023   12:02 PM 05/02/2023   10:19 AM 03/11/2023    3:05 PM 03/06/2023    2:38 PM 02/05/2023    8:19 AM 12/26/2022    7:28 PM  Advanced Directives  Does Patient Have a Medical Advance Directive? Yes No Yes No;Yes No Yes Yes  Type of Estate agent of [place];Living will  Living will;Healthcare Power of [employer] Power of [employer] Power of [place];Living will Healthcare Power of [place];Living will  Does patient want to make changes to medical advance directive?      No - Patient declined No - Patient declined  Copy of Healthcare Power of Attorney in Chart?      No - copy requested No - copy requested  Would patient like information on creating a medical advance directive?  No - Patient declined   Yes (MAU/Ambulatory/Procedural Areas - Information given)      Current Medications (verified) Outpatient Encounter Medications as of 03/11/2024  Medication Sig   acetaminophen  (TYLENOL ) 650 MG CR tablet Take 1,300 mg by mouth every 8 (eight) hours as needed for pain.   ALPRAZolam  (XANAX ) 0.25 MG tablet Take 1 tablet (0.25 mg total) by mouth daily as needed for anxiety.   aspirin  EC 81 MG tablet Take 81 mg by mouth daily. Swallow whole.   atorvastatin  (LIPITOR) 40 MG tablet Take 1 tablet (40 mg total) by mouth daily.   ELIQUIS  5 MG TABS tablet TAKE ONE TABLET BY MOUTH 2 TIMES A DAY   ipratropium (ATROVENT) 0.03 % nasal spray Place 2 sprays into both nostrils daily as needed for  rhinitis.   JARDIANCE  10 MG TABS tablet Take 1 tablet (10 mg total) by mouth daily before breakfast.   Lactobacillus-Inulin (CULTURELLE DIGESTIVE HEALTH PO) Take 1 capsule by mouth daily with lunch.   levothyroxine  (SYNTHROID ) 100 MCG tablet TAKE 1 TABLET BY MOUTH DAILY   loratadine  (CLARITIN ) 10 MG tablet Take 10 mg by mouth daily with breakfast.   losartan  (COZAAR ) 25 MG tablet TAKE ONE TABLET BY MOUTH EVERY DAY   metoprolol  succinate (TOPROL -XL) 50 MG 24 hr tablet Take 1 tablet (50 mg total) by mouth daily. Take with or immediately following a meal.   mexiletine (MEXITIL ) 200 MG capsule Take 1 capsule (200 mg total) by mouth 2 (two) times daily.   Multiple Vitamins-Minerals (CENTRUM SILVER PO) Take 1 tablet by mouth daily with lunch.   nitroGLYCERIN  (NITROSTAT ) 0.4 MG SL tablet Place 1 tablet (0.4 mg total) under the tongue every 5 (five) minutes as needed for chest pain.   Omega-3 Fatty Acids (FISH OIL) 1000 MG CAPS Take 2,000 mg by mouth daily with lunch.   pantoprazole  (PROTONIX ) 40 MG tablet Take 1 tablet (40 mg total) by mouth daily.   silodosin (RAPAFLO) 8 MG CAPS capsule Take 8 mg by mouth daily with supper.   Sod Bicarb-K Bicarb-Citric Acd (ALKA-SELTZER GOLD) [phone] MG TBEF Take 2 tablets by mouth daily as needed (  upset stomach/indigestion.).   Sodium Chloride -Sodium Bicarb (NETI POT SINUS WASH NA) Place 1 Dose into the nose 2 (two) times daily.   spironolactone  (ALDACTONE ) 25 MG tablet Take 0.5 tablets (12.5 mg total) by mouth every other day.   No facility-administered encounter medications on file as of 03/11/2024.    Allergies (verified) Patient has no known allergies.   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 Jude/Abbott   Rectal bleeding    due to hemorrhoid    Tubular adenoma of colon 2015   Past Surgical History:  Procedure Laterality Date   BIV UPGRADE N/A 11/13/2023   Procedure: BIV UPGRADE;  Surgeon: Mealor, Eulas BRAVO, MD;  Location: [facility] INVASIVE CV LAB;  Service: Cardiovascular;  Laterality: N/A;   CARDIOVERSION N/A 11/14/2022   Procedure: CARDIOVERSION;  Surgeon: Jeffrie Oneil BROCKS, MD;  Location: [facility] INVASIVE CV LAB;  Service: Cardiovascular;  Laterality: N/A;   CARDIOVERSION N/A 07/15/2023   Procedure: CARDIOVERSION;  Surgeon: Raford Riggs, MD;  Location: [facility] INVASIVE CV LAB;  Service: Cardiovascular;  Laterality: N/A;   CATARACT EXTRACTION Left    COLONOSCOPY     CORONARY STENT INTERVENTION N/A 05/02/2023   Procedure: CORONARY STENT INTERVENTION;  Surgeon: Swaziland, Peter M, MD;  Location: [facility] INVASIVE CV LAB;  Service: Cardiovascular;  Laterality: N/A;   CYSTOSCOPY/URETEROSCOPY/HOLMIUM LASER/STENT PLACEMENT Left 03/18/2023   Procedure: CYSTOSCOPY LEFT URETEROSCOPY/STENT PLACEMENT;  Surgeon: Watt Rush, MD;  Location: [facility] ORS;  Service: Urology;  Laterality: Left;  1 HR FOR CASE   EYE SURGERY     Cataract repaired 08/26/2017   HERNIA REPAIR Bilateral    Over ten years ago per patient. He was not sure of the date.   LEAD INSERTION N/A 11/13/2023   Procedure: LEAD INSERTION;  Surgeon: Nancey Eulas BRAVO, MD;  Location: [facility] INVASIVE CV LAB;  Service: Cardiovascular;  Laterality: N/A;   NASAL SINUS SURGERY Left 02/05/2023   Procedure: ENDOSCOPIC MAXILLARY ANTROSTOMY WITH TISSUE REMOVAL;  Surgeon: Jesus Oliphant, MD;  Location: [facility] OR;  Service: ENT;  Laterality: Left;   PACEMAKER IMPLANT N/A 12/26/2022   Procedure: PACEMAKER IMPLANT;  Surgeon: Nancey Eulas BRAVO, MD;  Location: [facility] INV[facility] CV LAB;  Service: Cardiovascular;  Laterality: N/A;   POLYPECTOMY     RIGHT/LEFT HEART CATH AND CORONARY ANGIOGRAPHY N/A 05/02/2023   Procedure: RIGHT/LEFT HEART CATH AND CORONARY ANGIOGRAPHY;  Surgeon: Swaziland, Peter M, MD;  Location: [facility] INVASIVE CV LAB;  Service: Cardiovascular;  Laterality: N/A;   ROTATOR CUFF REPAIR     both shoulders - patient does not remember the exact date   Family History  Problem Relation Age of Onset   Aneurysm Mother    Alzheimer's disease Father  Cancer Sister        ovarian   Stroke Brother    Hypertension Brother    Colon cancer Neg Hx    Esophageal cancer Neg Hx    Stomach cancer Neg Hx    Rectal cancer Neg Hx    Colon polyps Neg Hx    Social History   Socioeconomic History   Marital status: Married    Spouse name: Devere BROCKS.   Number of children: 2   Years of education: Not on file   Highest education level: Bachelor's degree (e.g., BA, AB, BS)  Occupational History   Not on file  Tobacco Use   Smoking status: Former    Current packs/day: 0.00    Average packs/day: 0.5 packs/day for 6.0 years (3.0 ttl pk-yrs)    Types: Cigarettes    Start date: 06/17/1973    Quit date: 06/18/1979    Years since quitting: 44.7   Smokeless tobacco: Never   Tobacco comments:    Former smoker 01/22/24  Vaping Use   Vaping status: Never Used  Substance and Sexual Activity   Alcohol use: Yes    Comment: Very limited social drinker   Drug use: No   Sexual activity: Yes    Birth control/protection: None  Other Topics Concern   Not on file  Social History Narrative   Not on file   Social Drivers of Health   Financial Resource Strain: Low Risk  (03/04/2024)   Overall Financial Resource Strain (CARDIA)    Difficulty of Paying Living Expenses: Not hard at all  Food  Insecurity: No Food Insecurity (03/04/2024)   Hunger Vital Sign    Worried About Running Out of Food in the Last Year: Never true    Ran Out of Food in the Last Year: Never true  Transportation Needs: No Transportation Needs (03/04/2024)   PRAPARE - Administrator, Civil Service (Medical): No    Lack of Transportation (Non-Medical): No  Physical Activity: Sufficiently Active (03/04/2024)   Exercise Vital Sign    Days of Exercise per Week: 6 days    Minutes of Exercise per Session: 30 min  Stress: No Stress Concern Present (03/04/2024)   [employer] of Occupational Health - Occupational Stress Questionnaire    Feeling of Stress: Only a little  Social Connections: Socially Integrated (03/04/2024)   Social Connection and Isolation Panel    Frequency of Communication with Friends and Family: More than three times a week    Frequency of Social Gatherings with Friends and Family: Three times a week    Attends Religious Services: More than 4 times per year    Active Member of Clubs or Organizations: Yes    Attends Banker Meetings: More than 4 times per year    Marital Status: Married    Tobacco Counseling Counseling given: Not Answered Tobacco comments: Former smoker 01/22/24   Clinical Intake:                        Activities of Daily Living    11/13/2023    7:00 PM 07/15/2023   12:01 PM  In your present state of health, do you have any difficulty performing the following activities:  Hearing? 0 0  Vision? 0 0  Difficulty concentrating or making decisions? 0 0  Doing errands, shopping? 0     Patient Care Team: Duanne Butler DASEN, MD as PCP - General (Family Medicine) Kate Lonni CROME, MD as  PCP - Cardiology (Cardiology) Mealor, Eulas BRAVO, MD as PCP - Electrophysiology (Cardiology) Nicholaus Sherlean CROME, [facility] (Inactive) as Pharmacist (Pharmacist) Darlean Ozell NOVAK, MD as Consulting Physician (Pulmonary Disease)  Indicate any recent  Medical Services you may have received from other than [facility] providers in the past year (date may be approximate).     Assessment:   This is a routine wellness examination for Ancel.  Hearing/Vision screen No results found.   Goals Addressed   None    Depression Screen    03/06/2023    2:36 PM 12/20/2022   10:36 AM 12/06/2021   10:37 AM 12/04/2020   10:29 AM 10/27/2020   10:00 AM 12/02/2019   10:36 AM 11/24/2018    9:24 AM  PHQ 2/9 Scores  PHQ - 2 Score 0 0 0 0 0 0 0  PHQ- 9 Score  5 1 1  2      Fall Risk    03/04/2023   12:47 PM 12/20/2022   10:36 AM 12/19/2022   12:09 PM 12/06/2021   10:32 AM 12/04/2020   10:29 AM  Fall Risk   Falls in the past year? 0 0 0 0 0  Number falls in past yr: 0 0 0 0 0  Injury with Fall? 0 0 0 0 0  Risk for fall due to :  No Fall Risks   No Fall Risks  Follow up Falls evaluation completed;Education provided;Falls prevention discussed Falls evaluation completed   Falls evaluation completed      Data saved with a previous flowsheet row definition    MEDICARE RISK AT HOME:    TIMED UP AND GO:  Was the test performed?  Yes  Length of time to ambulate 10 feet: 10 sec Gait steady and fast without use of assistive device    Cognitive Function:        03/06/2023    2:43 PM  6CIT Screen  What Year? 0 points  What month? 0 points  What time? 0 points  Count back from 20 0 points  Months in reverse 0 points  Repeat phrase 0 points  Total Score 0 points    Immunizations Immunization History  Administered Date(s) Administered   Fluad Quad(high Dose 65+) 03/16/2019   INFLUENZA, HIGH DOSE SEASONAL PF 04/02/2017, 03/13/2020, 04/09/2021   Influenza,inj,Quad PF,6+ Mos 04/06/2013, 04/06/2014, 04/07/2015, 03/29/2016, 03/10/2018   Influenza-Unspecified 03/17/2012, 04/04/2022, 03/24/2023   Moderna Sars-Covid-2 Vaccination 03/24/2023   PFIZER(Purple Top)SARS-COV-2 Vaccination 07/07/2019, 07/28/2019, 03/31/2020, 12/11/2020   Pfizer Covid-19 Vaccine  Bivalent Booster 27yrs & up 04/09/2021   Pfizer(Comirnaty)Fall Seasonal Vaccine 12 years and older 04/16/2022   Pneumococcal Conjugate-13 04/02/2017   Pneumococcal Polysaccharide-23 01/23/2016   Tdap 06/17/2009   Zoster Recombinant(Shingrix ) 04/02/2017, 06/02/2017    TDAP status: Due, Education has been provided regarding the importance of this vaccine. Advised may receive this vaccine at local pharmacy or Health Dept. Aware to provide a copy of the vaccination record if obtained from local pharmacy or Health Dept. Verbalized acceptance and understanding.  Flu Vaccine status: Due, Education has been provided regarding the importance of this vaccine. Advised may receive this vaccine at local pharmacy or Health Dept. Aware to provide a copy of the vaccination record if obtained from local pharmacy or Health Dept. Verbalized acceptance and understanding.  Pneumococcal vaccine status: Due, Education has been provided regarding the importance of this vaccine. Advised may receive this vaccine at local pharmacy or Health Dept. Aware to provide a copy of the vaccination record if obtained from  local pharmacy or Health Dept. Verbalized acceptance and understanding.  Covid-19 vaccine status: Declined, Education has been provided regarding the importance of this vaccine but patient still declined. Advised may receive this vaccine at local pharmacy or Health Dept.or vaccine clinic. Aware to provide a copy of the vaccination record if obtained from local pharmacy or Health Dept. Verbalized acceptance and understanding.  Qualifies for Shingles Vaccine? Yes   Zostavax completed Yes   Shingrix  Completed?: Yes  Screening Tests Health Maintenance  Topic Date Due   Influenza Vaccine  01/16/2024   COVID-19 Vaccine (8 - 2025-26 season) 02/16/2024   Medicare Annual Wellness (AWV)  03/05/2024   Colonoscopy  12/15/2025   Pneumococcal Vaccine: 50+ Years  Completed   Hepatitis C Screening  Completed   Zoster  Vaccines- Shingrix   Completed   HPV VACCINES  Aged Out   Meningococcal B Vaccine  Aged Out   DTaP/Tdap/Td  Discontinued    Health Maintenance  Health Maintenance Due  Topic Date Due   Influenza Vaccine  01/16/2024   COVID-19 Vaccine (8 - 2025-26 season) 02/16/2024   Medicare Annual Wellness (AWV)  03/05/2024    Colorectal cancer screening: Type of screening: Colonoscopy. Completed 12/16/2018. Repeat every 7 years  Lung Cancer Screening: (Low Dose CT Chest recommended if Age 18-80 years, 20 pack-year currently smoking OR have quit w/in 15years.) does not qualify.   Lung Cancer Screening Referral: N/A  Additional Screening:  Hepatitis C Screening: does qualify; Completed 12/04/2020  Vision Screening: Recommended annual ophthalmology exams for early detection of glaucoma and other disorders of the eye. Is the patient up to date with their annual eye exam?  Yes  Who is the provider or what is the name of the office in which the patient attends annual eye exams? Dr Robinson If pt is not established with a provider, would they like to be referred to a provider to establish care? No .   Dental Screening: Recommended annual dental exams for proper oral hygiene  Diabetic Foot Exam:   Community Resource Referral / Chronic Care Management: CRR required this visit?  No   CCM required this visit?  No     Plan:     I have personally reviewed and noted the following in the patient's chart:   Medical and social history Use of alcohol, tobacco or illicit drugs  Current medications and supplements including opioid prescriptions. Patient is not currently taking opioid prescriptions. Functional ability and status Nutritional status Physical activity Advanced directives List of other physicians Hospitalizations, surgeries, and ER visits in previous 12 months Vitals Screenings to include cognitive, depression, and falls Referrals and appointments  In addition, I have reviewed and  discussed with patient certain preventive protocols, quality metrics, and best practice recommendations. A written personalized care plan for preventive services as well as general preventive health recommendations were provided to patient.     Ronal Slater MARLA Angelena, LPN   [id number]   After Visit Summary: (In Person-Printed) AVS printed and given to the patient  Nurse Notes: Vaccines up to date per [facility].

## 2024-03-12 DIAGNOSIS — Z23 Encounter for immunization: Secondary | ICD-10-CM | POA: Diagnosis not present

## 2024-03-15 ENCOUNTER — Ambulatory Visit (HOSPITAL_COMMUNITY)
Admission: RE | Admit: 2024-03-15 | Discharge: 2024-03-15 | Disposition: A | Discharge status: Home / Self Care | Source: Ambulatory Visit | Attending: Family Medicine | Admitting: Family Medicine

## 2024-03-15 ENCOUNTER — Ambulatory Visit

## 2024-03-15 DIAGNOSIS — R1310 Dysphagia, unspecified: Secondary | ICD-10-CM

## 2024-03-15 DIAGNOSIS — R059 Cough, unspecified: Secondary | ICD-10-CM | POA: Insufficient documentation

## 2024-03-15 NOTE — Evaluation (Signed)
 Modified Barium Swallow Study  Patient Details  Name: Dustin Maynard MRN: [id number] Date of Birth: 09/02/1945  Today's Date: 03/15/2024  Modified Barium Swallow completed.  Full report located under Chart Review in the Imaging Section.  History of Present Illness Dustin Maynard is a 78 y.o. male who was referred for an OP MBS after endorsing intermittent difficulty swallowing.  He reports globus and solid foods lodging in chest, happening with more frequency this last month.   Clinical Impression Pt presents with normal oropharyngeal swallow function.  Esophageal sweep revealed barium retention and occasional backflow below the PES.  Pt had normal oral function, occasional transient penetration (PAS of 2, considered WFL), and good pharyngeal clearance. Recommend consideration of esophagram (barium swallow) in light of pt's described symptoms. Continue current diet. No SLP f/u needed.  Factors that may increase risk of adverse event in presence of aspiration Noe & Lianne 2021):    Swallow Evaluation Recommendations Recommendations: PO diet PO Diet Recommendation: Regular;Thin liquids (Level 0) Liquid Administration via: Cup;Straw Medication Administration: Whole meds with liquid Supervision: Patient able to self-feed Postural changes: Position pt fully upright for meals Oral care recommendations: Oral care BID (2x/day) Recommended consults: Consider esophageal assessment    Brinden Kincheloe L. Vona, MA CCC/SLP Clinical Specialist - Acute Care SLP Acute Rehabilitation Services Office number [phone]   Vona Palma Laurice 03/15/2024,11:41 AM

## 2024-03-16 NOTE — Progress Notes (Signed)
 Patient presents today to pick up custom molded foot orthotics, diagnosed with BIL bunion deformity by Dr. Magdalen.   Orthotics were dispensed and fit was satisfactory. Reviewed instructions for break-in and wear. Written instructions given to patient.  Patient will follow up as needed.   Lolita Schultze CPed

## 2024-03-22 ENCOUNTER — Telehealth: Payer: Self-pay | Admitting: Emergency Medicine

## 2024-03-22 NOTE — Telephone Encounter (Signed)
 Copied from CRM #[id number]. Topic: Clinical - Lab/Test Results >> Mar 22, 2024  1:59 PM Corin V wrote: Reason for CRM: Patient is calling stating that when he got his swallow test done he was told they were recommending he get the next stage of the test done where it is the upper throat and a lying down test. He wanted to ensure Dr. Tanda had seen the results and if she could order the additional tests/provide recommendations. Patient callback: [phone]

## 2024-03-25 ENCOUNTER — Other Ambulatory Visit: Payer: Self-pay | Admitting: Family Medicine

## 2024-03-25 DIAGNOSIS — K224 Dyskinesia of esophagus: Secondary | ICD-10-CM

## 2024-03-25 NOTE — Progress Notes (Signed)
 Remote PPM Transmission

## 2024-03-25 NOTE — Telephone Encounter (Signed)
 Please review note in chart review from ST dated 03/15/2024

## 2024-03-26 ENCOUNTER — Ambulatory Visit: Payer: Self-pay | Admitting: Family Medicine

## 2024-03-29 ENCOUNTER — Ambulatory Visit (INDEPENDENT_AMBULATORY_CARE_PROVIDER_SITE_OTHER)

## 2024-03-29 DIAGNOSIS — I5042 Chronic combined systolic (congestive) and diastolic (congestive) heart failure: Secondary | ICD-10-CM

## 2024-03-29 LAB — CUP PACEART REMOTE DEVICE CHECK
Battery Remaining Longevity: 77 mo
Battery Remaining Percentage: 95.5 %
Battery Voltage: 2.98 V
Brady Statistic AP VP Percent: 84 %
Brady Statistic AP VS Percent: 6 %
Brady Statistic AS VP Percent: 5.5 %
Brady Statistic AS VS Percent: 1 %
Brady Statistic RA Percent Paced: 87 %
Date Time Interrogation Session: 20251013024500
Implantable Lead Connection Status: 753985
Implantable Lead Connection Status: 753985
Implantable Lead Connection Status: 753985
Implantable Lead Implant Date: 20240711
Implantable Lead Implant Date: 20240711
Implantable Lead Implant Date: 20250529
Implantable Lead Location: 753858
Implantable Lead Location: 753859
Implantable Lead Location: 753860
Implantable Pulse Generator Implant Date: 20250529
Lead Channel Impedance Value: 1025 Ohm
Lead Channel Impedance Value: 450 Ohm
Lead Channel Impedance Value: 450 Ohm
Lead Channel Pacing Threshold Amplitude: 0.75 V
Lead Channel Pacing Threshold Amplitude: 0.75 V
Lead Channel Pacing Threshold Amplitude: 0.75 V
Lead Channel Pacing Threshold Pulse Width: 0.5 ms
Lead Channel Pacing Threshold Pulse Width: 0.5 ms
Lead Channel Pacing Threshold Pulse Width: 0.9 ms
Lead Channel Sensing Intrinsic Amplitude: 4.5 mV
Lead Channel Sensing Intrinsic Amplitude: 6.7 mV
Lead Channel Setting Pacing Amplitude: 2 V
Lead Channel Setting Pacing Amplitude: 2 V
Lead Channel Setting Pacing Amplitude: 2.5 V
Lead Channel Setting Pacing Pulse Width: 0.5 ms
Lead Channel Setting Pacing Pulse Width: 0.9 ms
Lead Channel Setting Sensing Sensitivity: 2 mV
Pulse Gen Model: 3562
Pulse Gen Serial Number: 8279232

## 2024-03-31 NOTE — Telephone Encounter (Signed)
 Copied from CRM [id number]. Topic: Clinical - Lab/Test Results >> Mar 31, 2024  3:49 PM Larissa S wrote: Reason for CRM: Patient requesting a callback to discuss results of swallow study

## 2024-03-31 NOTE — Telephone Encounter (Signed)
 Called pt and advised Dr. Tanda has referred him to [facility] and to be on the lookout for a phone call from them for scheduling. Pt expressed understanding.

## 2024-03-31 NOTE — Progress Notes (Signed)
 Remote PPM Transmission

## 2024-04-08 ENCOUNTER — Encounter: Payer: Self-pay | Admitting: Gastroenterology

## 2024-04-12 ENCOUNTER — Ambulatory Visit: Payer: Self-pay | Admitting: Cardiovascular Disease

## 2024-04-20 DIAGNOSIS — R3912 Poor urinary stream: Secondary | ICD-10-CM | POA: Diagnosis not present

## 2024-04-20 DIAGNOSIS — R3915 Urgency of urination: Secondary | ICD-10-CM | POA: Diagnosis not present

## 2024-04-20 DIAGNOSIS — R351 Nocturia: Secondary | ICD-10-CM | POA: Diagnosis not present

## 2024-04-20 DIAGNOSIS — N401 Enlarged prostate with lower urinary tract symptoms: Secondary | ICD-10-CM | POA: Diagnosis not present

## 2024-04-21 NOTE — Progress Notes (Unsigned)
 Cardiology Office Note:    Date:  04/23/2024   ID:  Carlos Choi, DOB January 09, 1946, MRN [id number]  PCP:  Duanne Butler DASEN, MD  Cardiologist:  Lonni LITTIE Nanas, MD  Electrophysiologist:  Eulas FORBES Furbish, MD   Referring MD: Duanne Butler DASEN, MD   Chief Complaint  Patient presents with   Congestive Heart Failure    History of Present Illness:    Carlos Choi is a 78 y.o. male with a hx of atrial flutter, ASD, heart block status post PPM, bronchiectasis, hypertension, tobacco use, hypothyroidism who is referred by Dr. Furbish for evaluation of heart failure.  He was initially diagnosed with atrial flutter 10/2022.  At that time he was started on Eliquis  and underwent DCCV.  Presented with complete heart block and underwent PPM 12/2022.  Echocardiogram 11/15/2022 showed EF 45 to 50%, normal RV function, moderate RV enlargement, moderate left atrial enlargement, severe right atrial enlargement, moderate MR, small secundum ASD with left-to-right shunting.  Echocardiogram 04/04/2023 showed EF 25 to 30%, mild RV dysfunction, mild mitral regurgitation, small secundum ASD.  LHC/RHC 05/02/2023 showed 99% stenosis in mid LAD status post DES, no significant shunt, mildly elevated filling pressures (RA 11, RV 43/6, PA 41/25/30, PCWP 23, CI 2.9).  Cardiac MRI 05/28/2023 showed ASD with Qp/Qs 1.2, LVEF 26%, septal dyskinesis consistent with RV pacing, RVEF 42%, subendocardial LGE in mid to apical anteroseptum consistent with prior infarct, mild mitral regurgitation (regurgitant fraction 18%).  Echocardiogram 09/24/23 showed EF 25-30%, mild RV dysfunction, mild to moderate MR.  Underwent BiV ICD upgrade 10/2023.  Echocardiogram 02/2024 showed EF 35 to 40%.  Since last clinic visit, he reports he is doing well.  Denies any chest pain, dyspnea, lightheadedness, syncope, lower extremity edema, or palpitations.  Continues to have some fatigue.  He is walking 1.5 miles per day.  Reports he is walking faster  now.  Denies any exertional symptoms.  Did note a nosebleed recently.   Past Medical History:  Diagnosis Date   Allergy    Anxiety    Arthritis    mild ankles    ASD (atrial septal defect)    ASD (small secundum ASD with predominantly left-to-right shunting) 11/15/22 TTE   Atrial flutter (HCC)    s/p DCCV 11/04/22   Bronchiectasis (HCC)    Cataract    removed left eye, forming right eye    CHF (congestive heart failure) (HCC)    Clotting disorder    Bruise easily & bleeding from cuts normally stops quickly   Diverticulosis    Dysrhythmia    2nd degree AV block, bradycardia s/p PPM 12/26/22; PVCs   GERD (gastroesophageal reflux disease)    Hemorrhoids    History of kidney stones    Hypertension    Hypothyroid    Presence of permanent cardiac pacemaker 12/26/2022   St. Jude/Abbott   Rectal bleeding    due to hemorrhoid    Tubular adenoma of colon 2015    Past Surgical History:  Procedure Laterality Date   BIV UPGRADE N/A 11/13/2023   Procedure: BIV UPGRADE;  Surgeon: Mealor, Eulas FORBES, MD;  Location: [facility] IN[facility]ASIVE CV [facility];  Service: Cardiovascular;  Laterality: N/A;   CARDIOVERSION N/A 11/14/2022   Procedure: CARDIOVERSION;  Surgeon: Jeffrie Oneil BROCKS, MD;  Location: [facility];  Service: Cardiovascular;  Laterality: N/A;   CARDIOVERSION N/A 07/15/2023   Procedure: CARDIOVERSION;  Surgeon: Raford Riggs, MD;  Location: [facility] INVASIVE CV LAB;  Service: Cardiovascular;  Laterality: N/A;  CATARACT EXTRACTION Left    COLONOSCOPY     CORONARY STENT INTERVENTION N/A 05/02/2023   Procedure: CORONARY STENT INTERVENTION;  Surgeon: Jordan, Peter M, MD;  Location: [facility] INVASIVE CV LAB;  Service: Cardiovascular;  Laterality: N/A;   CYSTOSCOPY/URETEROSCOPY/HOLMIUM LASER/STENT PLACEMENT Left 03/18/2023   Procedure: CYSTOSCOPY LEFT URETEROSCOPY/STENT PLACEMENT;  Surgeon: Watt Rush, MD;  Location: [facility] ORS;  Service: Urology;  Laterality: Left;  1 HR FOR CASE   EYE SURGERY     Cataract  repaired 08/26/2017   HERNIA REPAIR Bilateral    Over ten years ago per patient. He was not sure of the date.   LEAD INSERTION N/A 11/13/2023   Procedure: LEAD INSERTION;  Surgeon: Nancey Eulas BRAVO, MD;  Location: [facility];  Service: Cardiovascular;  Laterality: N/A;   NASAL SINUS SURGERY Left 02/05/2023   Procedure: ENDOSCOPIC MAXILLARY ANTROSTOMY WITH TISSUE REMOVAL;  Surgeon: Jesus Oliphant, MD;  Location: [facility] OR;  Service: ENT;  Laterality: Left;   PACEMAKER IMPLANT N/A 12/26/2022   Procedure: PACEMAKER IMPLANT;  Surgeon: Nancey Eulas BRAVO, MD;  Location: [facility];  Service: Cardiovascular;  Laterality: N/A;   POLYPECTOMY     RIGHT/LEFT HEART CATH AND CORONARY ANGIOGRAPHY N/A 05/02/2023   Procedure: RIGHT/LEFT HEART CATH AND CORONARY ANGIOGRAPHY;  Surgeon: Jordan, Peter M, MD;  Location: [facility] INVASIVE CV LAB;  Service: Cardiovascular;  Laterality: N/A;   ROTATOR CUFF REPAIR     both shoulders - patient does not remember the exact date    Current Medications: Current Meds  Medication Sig   acetaminophen  (TYLENOL ) 650 MG CR tablet Take 1,300 mg by mouth every 8 (eight) hours as needed for pain.   ALPRAZolam  (XANAX ) 0.25 MG tablet Take 1 tablet (0.25 mg total) by mouth daily as needed for anxiety.   atorvastatin  (LIPITOR) 40 MG tablet Take 1 tablet (40 mg total) by mouth daily.   ELIQUIS  5 MG TABS tablet TAKE ONE TABLET BY MOUTH 2 TIMES A DAY   fluticasone  (FLONASE ) 50 MCG/ACT nasal spray Place 2 sprays into both nostrils daily.   ipratropium (ATROVENT) 0.03 % nasal spray Place 2 sprays into both nostrils daily as needed for rhinitis.   JARDIANCE  10 MG TABS tablet Take 1 tablet (10 mg total) by mouth daily before breakfast.   Lactobacillus-Inulin (CULTURELLE DIGESTIVE HEALTH PO) Take 1 capsule by mouth daily with lunch.   levothyroxine  (SYNTHROID ) 100 MCG tablet TAKE 1 TABLET BY MOUTH DAILY   loratadine  (CLARITIN ) 10 MG tablet Take 10 mg by mouth daily with breakfast.    metoprolol  succinate (TOPROL -XL) 50 MG 24 hr tablet Take 1 tablet (50 mg total) by mouth daily. Take with or immediately following a meal.   mexiletine (MEXITIL ) 200 MG capsule Take 1 capsule (200 mg total) by mouth 2 (two) times daily.   Multiple Vitamins-Minerals (CENTRUM SILVER PO) Take 1 tablet by mouth daily with lunch.   nitroGLYCERIN  (NITROSTAT ) 0.4 MG SL tablet Place 1 tablet (0.4 mg total) under the tongue every 5 (five) minutes as needed for chest pain.   Omega-3 Fatty Acids (FISH OIL) 1000 MG CAPS Take 2,000 mg by mouth daily with lunch.   pantoprazole  (PROTONIX ) 40 MG tablet Take 1 tablet (40 mg total) by mouth daily.   sacubitril -valsartan  (ENTRESTO ) 24-26 MG Take 1 tablet by mouth 2 (two) times daily.   silodosin (RAPAFLO) 8 MG CAPS capsule Take 8 mg by mouth daily with supper.   Sod Bicarb-K Bicarb-Citric Acd (ALKA-SELTZER GOLD) [id number] MG TBEF Take 2  tablets by mouth daily as needed (upset stomach/indigestion.).   Sodium Chloride -Sodium Bicarb (NETI POT SINUS WASH NA) Place 1 Dose into the nose 2 (two) times daily.   spironolactone  (ALDACTONE ) 25 MG tablet Take 0.5 tablets (12.5 mg total) by mouth every other day.   [DISCONTINUED] aspirin  EC 81 MG tablet Take 81 mg by mouth daily. Swallow whole.   [DISCONTINUED] losartan  (COZAAR ) 25 MG tablet TAKE ONE TABLET BY MOUTH EVERY DAY     Allergies:   Patient has no known allergies.   Social History   Socioeconomic History   Marital status: Married    Spouse name: Devere BROCKS.   Number of children: 2   Years of education: Not on file   Highest education level: Bachelor's degree (e.g., BA, AB, BS)  Occupational History   Not on file  Tobacco Use   Smoking status: Former    Current packs/day: 0.00    Average packs/day: 0.5 packs/day for 6.0 years (3.0 ttl pk-yrs)    Types: Cigarettes    Start date: 06/17/1973    Quit date: 06/18/1979    Years since quitting: 44.8   Smokeless tobacco: Never   Tobacco comments:    Former smoker  01/22/24  Vaping Use   Vaping status: Never Used  Substance and Sexual Activity   Alcohol use: Yes    Comment: Very limited social drinker   Drug use: No   Sexual activity: Yes    Birth control/protection: None  Other Topics Concern   Not on file  Social History Narrative   Not on file   Social Drivers of Health   Financial Resource Strain: Low Risk  (03/04/2024)   Overall Financial Resource Strain (CARDIA)    Difficulty of Paying Living Expenses: Not hard at all  Food Insecurity: No Food Insecurity (03/04/2024)   Hunger Vital Sign    Worried About Running Out of Food in the Last Year: Never true    Ran Out of Food in the Last Year: Never true  Transportation Needs: No Transportation Needs (03/04/2024)   PRAPARE - Administrator, Civil Service (Medical): No    Lack of Transportation (Non-Medical): No  Physical Activity: Sufficiently Active (03/04/2024)   Exercise Vital Sign    Days of Exercise per Week: 6 days    Minutes of Exercise per Session: 30 min  Stress: No Stress Concern Present (03/04/2024)   [employer] of Occupational Health - Occupational Stress Questionnaire    Feeling of Stress: Only a little  Social Connections: Socially Integrated (03/04/2024)   Social Connection and Isolation Panel    Frequency of Communication with Friends and Family: More than three times a week    Frequency of Social Gatherings with Friends and Family: Three times a week    Attends Religious Services: More than 4 times per year    Active Member of Clubs or Organizations: Yes    Attends Engineer, Structural: More than 4 times per year    Marital Status: Married     Family History: [name] family history includes Alzheimer's disease in his father; Aneurysm in his mother; Cancer in his sister; Hypertension in his brother; Stroke in his brother. There is no history of Colon cancer, Esophageal cancer, Stomach cancer, Rectal cancer, or Colon polyps.  ROS:    Please see the history of present illness.     All other systems reviewed and are negative.  EKGs/Labs/Other Studies Reviewed:    The following studies were reviewed today:  EKG:   03/20/2023: V-paced, frequent PVCs 06/22/2022: V-paced, PVC, rate 76  Recent Labs: 01/22/2024: Magnesium 2.3 03/04/2024: ALT 15; BUN 17; Creat 1.03; Hemoglobin 16.7; Platelets 164; Potassium 4.8; Sodium 141; TSH 1.11  Recent Lipid Panel    Component Value Date/Time   CHOL 133 03/04/2024 0957   TRIG 101 03/04/2024 0957   HDL 48 03/04/2024 0957   CHOLHDL 2.8 03/04/2024 0957   VLDL 19 06/27/2023 0925   LDLCALC 67 03/04/2024 0957    Physical Exam:    VS:  BP 114/71 (BP Location: Left Arm, Patient Position: Sitting, Cuff Size: Normal)   Pulse 68   Ht 6' 4 (1.93 m)   Wt 179 lb 9.6 oz (81.5 kg)   SpO2 99%   BMI 21.86 kg/m     Wt Readings from Last 3 Encounters:  04/23/24 179 lb 9.6 oz (81.5 kg)  03/11/24 179 lb (81.2 kg)  02/18/24 175 lb (79.4 kg)     GEN:  Well nourished, well developed in no acute distress HEENT: Normal NECK: No JVD; No carotid bruits CARDIAC: RRR, no murmurs, rubs, gallops RESPIRATORY:  Clear to auscultation without rales, wheezing or rhonchi  ABDOMEN: Soft, non-tender, non-distended MUSCULOSKELETAL:  No edema; No deformity  SKIN: Warm and dry NEUROLOGIC:  Alert and oriented x 3 PSYCHIATRIC:  Normal affect   ASSESSMENT:    1. Chronic combined systolic and diastolic heart failure (HCC)   2. CAD in native artery   3. Typical atrial flutter (HCC)   4. Hyperlipidemia, unspecified hyperlipidemia type   5. Medication management       PLAN:    Chronic combined systolic and diastolic heart failure: EF 45 to 50% on echo 10/2022.  Echocardiogram 04/04/2023 showed EF 25 to 30%, mild RV dysfunction, my left atrial lodgment, mild mitral regurgitation, small secundum ASD.  LHC/RHC 05/02/2023 showed 99% stenosis in mid LAD status post DES, no significant shunt, mildly elevated  filling pressures (RA 11, RV 43/6, PA 41/25/30, PCWP 23, CI 2.9).  Cardiac MRI 05/28/2023 showed ASD with Qp/Qs 1.2, LVEF 26%, septal dyskinesis consistent with RV pacing, RVEF 42%, subendocardial LGE in mid to apical anteroseptum consistent with prior infarct, mild mitral regurgitation (regurgitant fraction 18%).  Echocardiogram 09/24/23 showed EF 25-30%, mild RV dysfunction, mild to moderate MR.  Given LVEF remains less than 35% despite GDMT, and considering he is predominatly RV paced rhythm, referred to EP and underwent BiV ICD upgrade 10/2023.  Echocardiogram 02/2024 showed EF 35 to 40%. -Continue Toprol -XL 50 mg daily -Will switch from losartan  to Entresto  24-26 mg twice daily.  Check BMET today and again in 1 week -Continue Jardiance  10 mg daily -Continue spironolactone  12.5 mg every other day.   CAD: LHC 05/02/2023 showed 50% proximal LAD, 99% mid LAD, 70% OM1 stenosis, status post DES to mid LAD. -Completed 1 month of triple therapy, and 6 months on Plavix  plus Eliquis .  Was having a lot of bruising on Plavix , now on aspirin  plus Eliquis .  Given has been 1 year since his PCI, will stop aspirin  and continue Eliquis  monotherapy -Continue atorvastatin   Atrial flutter: Diagnosed 10/2022.  Underwent DCCV at that time.  Had recurrent atrial flutter and underwent repeat DCCV 07/15/23.  Has followed with EP to consider ablation, sees Dr. Nancey. -Continue Eliquis  -Continue Toprol -XL 50 mg daily  Heart block: Status post PPM, now upgraded to BiV ICD as above.  Follows with EP  Frequent PVCs: continue Toprol -XL 50 mg daily.  Stated on mexiletine by EP.  ASD:  Small secundum ASD with left to right shunting noted on echocardiogram 10/2022.  No significant shunting on cath as above.  Small shunt (Qp/Qs 1.2) on CMR 05/2023  Hyperlipidemia: On atorvastatin  40 mg daily.  LDL 67 on 02/2024  Leg pain: Normal ABIs 01/2024  RTC in 4 months  Medication Adjustments/Labs and Tests Ordered: Current medicines are  reviewed at length with the patient today.  Concerns regarding medicines are outlined above.  Orders Placed This Encounter  Procedures   Basic Metabolic Panel (BMET)   Magnesium   Basic Metabolic Panel (BMET)   Magnesium   Meds ordered this encounter  Medications   sacubitril -valsartan  (ENTRESTO ) 24-26 MG    Sig: Take 1 tablet by mouth 2 (two) times daily.    Dispense:  180 tablet    Refill:  3    Patient Instructions  Medication Instructions:  Your physician has recommended you make the following change in your medication:  STOP: Aspirin  STOP: Losartan   START: Entresto  24-26 mg twice daily  *If you need a refill on your cardiac medications before your next appointment, please call your pharmacy*  Lab Work: TODAY: BMET, Mag In 2 weeks: BMET, Mag If you have labs (blood work) drawn today and your tests are completely normal, you will receive your results only by: MyChart Message (if you have MyChart) OR A paper copy in the mail If you have any lab test that is abnormal or we need to change your treatment, we will call you to review the results.   Follow-Up: At [facility], you and your health needs are our priority.  As part of our continuing mission to provide you with exceptional heart care, our providers are all part of one team.  This team includes your primary Cardiologist (physician) and Advanced Practice Providers or APPs (Physician Assistants and Nurse Practitioners) who all work together to provide you with the care you need, when you need it.  Your next appointment:   4 month(s)  Provider:   Lonni LITTIE Nanas, MD     Signed, Lonni LITTIE Nanas, MD  04/23/2024 2:55 PM    [facility]

## 2024-04-23 ENCOUNTER — Ambulatory Visit: Attending: Cardiology | Admitting: Cardiology

## 2024-04-23 ENCOUNTER — Encounter: Payer: Self-pay | Admitting: Cardiology

## 2024-04-23 VITALS — BP 114/71 | HR 68 | Ht 76.0 in | Wt 179.6 lb

## 2024-04-23 DIAGNOSIS — Z79899 Other long term (current) drug therapy: Secondary | ICD-10-CM | POA: Diagnosis not present

## 2024-04-23 DIAGNOSIS — I5042 Chronic combined systolic (congestive) and diastolic (congestive) heart failure: Secondary | ICD-10-CM | POA: Diagnosis not present

## 2024-04-23 DIAGNOSIS — I483 Typical atrial flutter: Secondary | ICD-10-CM | POA: Diagnosis not present

## 2024-04-23 DIAGNOSIS — E785 Hyperlipidemia, unspecified: Secondary | ICD-10-CM

## 2024-04-23 DIAGNOSIS — I2510 Atherosclerotic heart disease of native coronary artery without angina pectoris: Secondary | ICD-10-CM | POA: Diagnosis not present

## 2024-04-23 MED ORDER — SACUBITRIL-VALSARTAN 24-26 MG PO TABS: 1.0000 | ORAL_TABLET | Freq: Two times a day (BID) | ORAL | 3 refills | Status: AC

## 2024-04-23 NOTE — Patient Instructions (Addendum)
 Medication Instructions:  Your physician has recommended you make the following change in your medication:  STOP: Aspirin  STOP: Losartan   START: Entresto  24-26 mg twice daily  *If you need a refill on your cardiac medications before your next appointment, please call your pharmacy*  Lab Work: TODAY: BMET, Mag In 2 weeks: BMET, Mag If you have labs (blood work) drawn today and your tests are completely normal, you will receive your results only by: MyChart Message (if you have MyChart) OR A paper copy in the mail If you have any lab test that is abnormal or we need to change your treatment, we will call you to review the results.   Follow-Up: At [facility], you and your health needs are our priority.  As part of our continuing mission to provide you with exceptional heart care, our providers are all part of one team.  This team includes your primary Cardiologist (physician) and Advanced Practice Providers or APPs (Physician Assistants and Nurse Practitioners) who all work together to provide you with the care you need, when you need it.  Your next appointment:   4 month(s)  Provider:   Lonni LITTIE Nanas, MD

## 2024-04-24 LAB — BASIC METABOLIC PANEL WITH GFR
BUN/Creatinine Ratio: 15 (ref 10–24)
BUN: 15 mg/dL (ref 8–27)
CO2: 23 mmol/L (ref 20–29)
Calcium: 9.5 mg/dL (ref 8.6–10.2)
Chloride: 105 mmol/L (ref 96–106)
Creatinine, Ser: 0.99 mg/dL (ref 0.76–1.27)
Glucose: 80 mg/dL (ref 70–99)
Potassium: 4.5 mmol/L (ref 3.5–5.2)
Sodium: 144 mmol/L (ref 134–144)
eGFR: 79 mL/min/1.73 (ref >59)

## 2024-04-24 LAB — MAGNESIUM: Magnesium: 2.3 mg/dL (ref 1.6–2.3)

## 2024-04-25 ENCOUNTER — Ambulatory Visit: Payer: Self-pay | Admitting: Cardiology

## 2024-05-10 ENCOUNTER — Other Ambulatory Visit: Payer: Self-pay

## 2024-05-10 DIAGNOSIS — Z79899 Other long term (current) drug therapy: Secondary | ICD-10-CM | POA: Diagnosis not present

## 2024-05-11 LAB — BASIC METABOLIC PANEL WITH GFR
BUN/Creatinine Ratio: 15 (ref 10–24)
BUN: 14 mg/dL (ref 8–27)
CO2: 22 mmol/L (ref 20–29)
Calcium: 9 mg/dL (ref 8.6–10.2)
Chloride: 107 mmol/L — ABNORMAL HIGH (ref 96–106)
Creatinine, Ser: 0.96 mg/dL (ref 0.76–1.27)
Glucose: 70 mg/dL (ref 70–99)
Potassium: 4.3 mmol/L (ref 3.5–5.2)
Sodium: 144 mmol/L (ref 134–144)
eGFR: 82 mL/min/1.73 (ref >59)

## 2024-05-11 LAB — MAGNESIUM: Magnesium: 2.2 mg/dL (ref 1.6–2.3)

## 2024-05-17 ENCOUNTER — Other Ambulatory Visit: Payer: Self-pay | Admitting: Cardiology

## 2024-05-17 DIAGNOSIS — E785 Hyperlipidemia, unspecified: Secondary | ICD-10-CM

## 2024-05-21 MED ORDER — NITROGLYCERIN 0.4 MG SL SUBL: 0.4000 mg | SUBLINGUAL_TABLET | SUBLINGUAL | 11 refills | Status: AC | PRN

## 2024-05-24 ENCOUNTER — Ambulatory Visit: Attending: Cardiovascular Disease | Admitting: Cardiovascular Disease

## 2024-05-24 VITALS — BP 120/70 | HR 70 | Ht 76.0 in | Wt 183.0 lb

## 2024-05-24 DIAGNOSIS — I483 Typical atrial flutter: Secondary | ICD-10-CM | POA: Diagnosis not present

## 2024-05-24 NOTE — Patient Instructions (Signed)
 Medication Instructions:  Your physician recommends that you continue on your current medications as directed. Please refer to the Current Medication list given to you today.  *If you need a refill on your cardiac medications before your next appointment, please call your pharmacy*  Lab Work: None ordered.  If you have labs (blood work) drawn today and your tests are completely normal, you will receive your results only by: MyChart Message (if you have MyChart) OR A paper copy in the mail If you have any lab test that is abnormal or we need to change your treatment, we will call you to review the results.  Testing/Procedures: None ordered.   Follow-Up: At [facility], you and your health needs are our priority.  As part of our continuing mission to provide you with exceptional heart care, our providers are all part of one team.  This team includes your primary Cardiologist (physician) and Advanced Practice Providers or APPs (Physician Assistants and Nurse Practitioners) who all work together to provide you with the care you need, when you need it.  Your next appointment:   6 months with Dr Marko PA

## 2024-05-24 NOTE — Progress Notes (Signed)
 Electrophysiology Office Note:    Date:  05/24/2024   ID:  Carlos Choi, DOB 1946-06-08, MRN [id number]  PCP:  Duanne Butler DASEN, MD   [facility] Providers Cardiologist:  Lonni LITTIE Nanas, MD Electrophysiologist:  Eulas FORBES Furbish, MD     Referring MD: Duanne Butler DASEN, MD   History of Present Illness:    Carlos Choi is a 78 y.o. male with a medical history significant for smoking, bronchiectasis, hypothyroidism, and atrial flutter referred for bradycardia.     He was diagnosed with atrial flutter incidentally at the time of maxillary antrostomy on 10/16/2022.  He had slow ventricular rates, 35 to 70 bpm.  He was unaware of his arrhythmia.   Eliquis  started and he underwent DC cardioversion.   ECG from May 30 shows Mobitz 1 AV block. He has been having significant fatigue. He is able to walk a mile and a half at a slow pace with his wife every morning. He has noticed intermittent ankle swelling over the past 1 months.  He underwent pacemaker placement on December 26, 2022. This was a complicated procedure. His left axillary vein was occluded, so we had to perform a right-sided implant.  Echocardiogram October 2024 showed an EF of 25 to 30%.  He underwent PCI of a 99% LAD stenosis in November 2024.  Repeat echocardiogram September 24, 2023 showed an EF of 25 to 30%.  At her last visit, mexiletine was started.  Repeat echocardiogram since then showed improvement in EF to greater than 35%.  PVC burden has decreased on mexiletine.     Today, he reports that he is doing psychologist, clinical well.  He does not have any discomfort at the device site.  He does have significant fatigue and shortness of breath.    EKGs/Labs/Other Studies Reviewed Today:    Echocardiogram:  TTE 11/15/2022 EF 45-50%. Moderately dilated LA, severely dilated RA  TTE 09/24/2023 EF 25 to 30%  TTE February 26, 2024 LVEF 35 to 40%.  Mildly dilated left atrium.  Normal right atrial  size  Monitors:   Stress testing:   Advanced imaging:  Cardiac MRI December 2024 ASD with small left-to-right shunt.  Moderate LV dilation.  EF 26%.  Septal dyskinesis consistent with RV pacing.  Subendocardial LGE in the mid to apical anteroseptum consistent with prior infarct.  Cardiac catherization   EKG:   EKG Interpretation Date/Time:  Monday May 24 2024 15:44:00 EST Ventricular Rate:  71 PR Interval:  116 QRS Duration:  148 QT Interval:  422 QTC Calculation: 458 R Axis:   -84  Text Interpretation: AV dual-paced rhythm Biventricular pacemaker detected When compared with ECG of 18-Feb-2024 12:19, No significant change was found Confirmed by Furbish Eulas [phone]) on 05/24/2024 3:49:47 PM     Physical Exam:    VS:  BP 120/70 (BP Location: Right Arm, Patient Position: Sitting, Cuff Size: Normal)   Pulse 70   Ht 6' 4 (1.93 m)   Wt 183 lb (83 kg)   SpO2 98%   BMI 22.28 kg/m     Wt Readings from Last 3 Encounters:  05/24/24 183 lb (83 kg)  04/23/24 179 lb 9.6 oz (81.5 kg)  03/11/24 179 lb (81.2 kg)     GEN:  Well nourished, well developed in no acute distress CARDIAC: irregular rhythm, no murmurs, rubs, gallops The device site is normal -- no tenderness, edema, drainage, redness, threatened erosion.  RESPIRATORY:  Normal work of breathing MUSCULOSKELETAL: no edema  ASSESSMENT & PLAN:    Second degree AV block, complete heart block S/p pacemaker placement July, 2024 His device was interrogated today.  I reviewed the interrogation in detail.  See Paceart for report He is not device dependent today  Atrial flutter Typical atrial flutter on ECG 11/07/2022 No recurrence on device interrogation RA was severely dilated in the past, read as normal on most recent TTE Continue apixaban  5 mg May need ablation in the future  Frequent PVCs May be contributing to fatigue and cardiomyopathy PVC burden 12% on prior interrogation, no more than 9%  today Continue metoprolol  50 mg daily, mexilitine 200mg  PO BID   Secondary hypercoagulable state Continue apixaban  5 mg  Cardiomyopathy EF 25% I suspect this is due in large part to RV pacing Status post upgrade to CRT pacemaker Repeat TTE is pending Continue metoprolol  XL 25, losartan  25    Signed, Eulas FORBES Furbish, MD  05/24/2024 3:51 PM    [facility]

## 2024-05-27 ENCOUNTER — Telehealth: Payer: Self-pay | Admitting: Gastroenterology

## 2024-05-27 ENCOUNTER — Ambulatory Visit (INDEPENDENT_AMBULATORY_CARE_PROVIDER_SITE_OTHER): Admitting: Gastroenterology

## 2024-05-27 ENCOUNTER — Encounter: Payer: Self-pay | Admitting: Gastroenterology

## 2024-05-27 VITALS — BP 126/80 | HR 75 | Ht 70.0 in | Wt 182.2 lb

## 2024-05-27 DIAGNOSIS — K224 Dyskinesia of esophagus: Secondary | ICD-10-CM | POA: Diagnosis not present

## 2024-05-27 DIAGNOSIS — K219 Gastro-esophageal reflux disease without esophagitis: Secondary | ICD-10-CM | POA: Diagnosis not present

## 2024-05-27 DIAGNOSIS — R1310 Dysphagia, unspecified: Secondary | ICD-10-CM

## 2024-05-27 DIAGNOSIS — R1319 Other dysphagia: Secondary | ICD-10-CM

## 2024-05-27 NOTE — Patient Instructions (Addendum)
 You have been scheduled for a Barium Esophogram at [facility] (1st floor of the hospital) on 06/04/24 at 9:00am. Please arrive 30 minutes prior to your appointment for registration. Make certain not to have anything to eat or drink after midnight prior to your test. If you need to reschedule for any reason, please contact radiology at [phone] to do so. __________________________________________________________________ A barium swallow is an examination that concentrates on views of the esophagus. This tends to be a double contrast exam (barium and two liquids which, when combined, create a gas to distend the wall of the oesophagus) or single contrast (non-ionic iodine based). The study is usually tailored to your symptoms so a good history is essential. Attention is paid during the study to the form, structure and configuration of the esophagus, looking for functional disorders (such as aspiration, dysphagia, achalasia, motility and reflux) EXAMINATION You may be asked to change into a gown, depending on the type of swallow being performed. A radiologist and radiographer will perform the procedure. The radiologist will advise you of the type of contrast selected for your procedure and direct you during the exam. You will be asked to stand, sit or lie in several different positions and to hold a small amount of fluid in your mouth before being asked to swallow while the imaging is performed .In some instances you may be asked to swallow barium coated marshmallows to assess the motility of a solid food bolus. The exam can be recorded as a digital or video fluoroscopy procedure. POST PROCEDURE It will take 1-2 days for the barium to pass through your system. To facilitate this, it is important, unless otherwise directed, to increase your fluids for the next 24-48hrs and to resume your normal diet.  This test typically takes about 30 minutes to  perform. __________________________________________________________________________________.  You have been scheduled for an endoscopy. Please follow written instructions given to you at your visit today.  If you use inhalers (even only as needed), please bring them with you on the day of your procedure.  If you take any of the following medications, they will need to be adjusted prior to your procedure:   DO NOT TAKE 7 DAYS PRIOR TO TEST- Trulicity (dulaglutide) Ozempic, Wegovy (semaglutide) Mounjaro, Zepbound (tirzepatide) Bydureon Bcise (exanatide extended release)  DO NOT TAKE 1 DAY PRIOR TO YOUR TEST Rybelsus (semaglutide) Adlyxin (lixisenatide) Victoza (liraglutide) Byetta (exanatide) ___________________________________________________________________________  Due to recent changes in healthcare laws, you may see the results of your imaging and laboratory studies on MyChart before your provider has had a chance to review them.  We understand that in some cases there may be results that are confusing or concerning to you. Not all laboratory results come back in the same time frame and the provider may be waiting for multiple results in order to interpret others.  Please give us  48 hours in order for your provider to thoroughly review all the results before contacting the office for clarification of your results.  _______________________________________________________  If your blood pressure at your visit was 140/90 or greater, please contact your primary care physician to follow up on this.  _______________________________________________________  If you are age 76 or older, your body mass index should be between 23-30. Your Body mass index is 26.14 kg/m. If this is out of the aforementioned range listed, please consider follow up with your Primary Care Provider.  If you are age 76 or younger, your body mass index should be between 19-25. Your Body mass index  is 26.14 kg/m. If  this is out of the aformentioned range listed, please consider follow up with your Primary Care Provider.   ________________________________________________________  The [facility] providers would like to encourage you to use MYCHART to communicate with providers for non-urgent requests or questions.  Due to long hold times on the telephone, sending your provider a message by [facility] may be a faster and more efficient way to get a response.  Please allow 48 business hours for a response.  Please remember that this is for non-urgent requests.  _______________________________________________________  [facility] is using a team-based approach to care.  Your team is made up of your doctor and two to three APPS. Our APPS (Nurse Practitioners and Physician Assistants) work with your physician to ensure care continuity for you. They are fully qualified to address your health concerns and develop a treatment plan. They communicate directly with your gastroenterologist to care for you. Seeing the Advanced Practice Practitioners on your physician's team can help you by facilitating care more promptly, often allowing for earlier appointments, access to diagnostic testing, procedures, and other specialty referrals.   Thank you for trusting me with your gastrointestinal care. Deanna May, FNP-C

## 2024-05-27 NOTE — Progress Notes (Signed)
 Chief Complaint:esophageal dysmotility Primary GI Doctor:Dr. Charlanne  HPI:  Patient is a  78  year old male patient with past medical history of hypertension, who was referred to me by Tanda Bleacher, MD on 03/25/24 for a evaluation of esophageal dysmotility .    Interval History Patient presents for evaluation of esophageal dysphagia, solids only.   Patient reports intermittent issues for about a year. He reports currently occurs if he eats quickly and doesn't take his time. He does admit he is a fast eater. He reports worse with meat and high fibrous foods. He reports history of heartburn  and was taking OTC Tums. He reports since he has had the dysphagia the heartburn has went away.  Never had EGD. He does have intermittent epigastric discomfort where he has pressure in upper abdomen where something feels it needs to come back up. He will regurgitate the food. Appetite is good.   He denies altered BM's or blood in stool.  Former smoker, stopped 5 years ago. No alcohol use  Patients last colonoscopy was with [facility] approximately 12 years ago and per patient was normal. He reports doing Cologuard after that and negative. Not interested in any more colonoscopies.  Patient's family history includes: sister with breast CA  Wt Readings from Last 3 Encounters:  05/27/24 182 lb 3.2 oz (82.6 kg)  03/03/24 173 lb 6.4 oz (78.7 kg)  07/21/13 165 lb 3.2 oz (74.9 kg)    Past Medical History:  Diagnosis Date   Hyperlipidemia    Hypertension     History reviewed. No pertinent surgical history.  Current Outpatient Medications  Medication Sig Dispense Refill   amLODipine (NORVASC) 10 MG tablet      Ascorbic Acid (VITAMIN C) 1000 MG tablet Take 500 mg by mouth daily.     Flaxseed, Linseed, (FLAX SEEDS PO) Take by mouth.     GARLIC PO Take by mouth.     lisinopril (PRINIVIL,ZESTRIL) 10 MG tablet      Multiple Vitamins-Minerals (CENTRUM SILVER PO) Take by mouth.     niacin 500 MG  tablet Take 500 mg by mouth at bedtime.     sildenafil (REVATIO) 20 MG tablet Take 20 mg by mouth as needed.     No current facility-administered medications for this visit.    Allergies as of 05/27/2024   (Not on File)    Family History  Problem Relation Age of Onset   Heart disease Mother    Breast cancer Sister    Prostate cancer Brother     Review of Systems:    Constitutional: No weight loss, fever, chills, weakness or fatigue HEENT: Eyes: No change in vision               Ears, Nose, Throat:  No change in hearing or congestion Skin: No rash or itching Cardiovascular: No chest pain, chest pressure or palpitations   Respiratory: No SOB or cough Gastrointestinal: See HPI and otherwise negative Genitourinary: No dysuria or change in urinary frequency Neurological: No headache, dizziness or syncope Musculoskeletal: No new muscle or joint pain Hematologic: No bleeding or bruising Psychiatric: No history of depression or anxiety    Physical Exam:  Vital signs: BP 126/80   Pulse 75   Ht 5' 10 (1.778 m)   Wt 182 lb 3.2 oz (82.6 kg)   SpO2 97%   BMI 26.14 kg/m   Constitutional:   Pleasant male appears to be in NAD, Well developed, Well nourished, alert and cooperative  Eyes:   PEERL, EOMI. No icterus. Conjunctiva pink. Neck:  Supple Throat: Oral cavity and pharynx without inflammation, swelling or lesion.  Respiratory: Respirations even and unlabored. Lungs clear to auscultation bilaterally.   No wheezes, crackles, or rhonchi.  Cardiovascular: Normal S1, S2. Regular rate and rhythm. No peripheral edema, cyanosis or pallor.  Gastrointestinal:  Soft, nondistended, nontender. No rebound or guarding. Normal bowel sounds. No appreciable masses or hepatomegaly. Rectal:  Not performed.  Msk:  Symmetrical without gross deformities. Without edema, no deformity or joint abnormality.  Neurologic:  Alert and  oriented x4;  grossly normal neurologically.  Skin:   Dry and intact  without significant lesions or rashes.  RELEVANT LABS AND IMAGING:  03/15/24 MBSS FINDINGS: Vestibular Penetration: Occasional transient penetration seen with thin liquids.   Aspiration:  None seen.   Other: Moderate spondylosis of the cervical spine. Esophageal dysmotility with loss of the primary stripping wave. IMPRESSION: No significant penetration or aspiration seen.  Speech report: Pt presents with normal oropharyngeal swallow function. Esophageal sweep revealed barium retention and occasional backflow below the PES. Pt had normal oral function, occasional transient penetration (PAS of 2, considered WFL), and good pharyngeal clearance. Recommend consideration of esophagram (barium swallow) in light of pt's described symptoms. Continue current diet. No SLP f/u needed.    Assessment: Encounter Diagnoses  Name Primary?   Esophageal dysmotility Yes   Esophageal dysphagia    Gastroesophageal reflux disease, unspecified whether esophagitis present      78 year old male patient who presents with esophageal dysphagia and remote history of GERD.  Most recent modified barium swallow study showed esophageal dysmotility with loss of the primary stripping wave. Discussed with Dr. Nandigam (Doc of Day), will proceed with both barium esophagram and EGD with possible dilatation in [facility] with Dr. Charlanne to rule out esophageal structural abnormalities, stricture, EOE, and/or Barrett's esophagus. If negative workup will consider impedence study and eso manometry.  Plan: - Dysphagia precautions -Order barium esophagagram  -Schedule EGD in [facility] with Dr. Charlanne. The risks and benefits of EGD with possible biopsies and esophageal dilation were discussed with the patient who agrees to proceed.   Thank you for the courtesy of this consult. Please call me with any questions or concerns.   Keller Mikels, FNP-C [facility] 05/27/2024, 11:32 AM  Cc: Tanda Bleacher, MD

## 2024-05-27 NOTE — Telephone Encounter (Signed)
 error

## 2024-06-03 ENCOUNTER — Other Ambulatory Visit (INDEPENDENT_AMBULATORY_CARE_PROVIDER_SITE_OTHER)

## 2024-06-03 ENCOUNTER — Other Ambulatory Visit: Payer: Self-pay

## 2024-06-03 ENCOUNTER — Ambulatory Visit: Admitting: Gastroenterology

## 2024-06-03 VITALS — BP 116/72 | HR 84 | Temp 98.0°F | Resp 15 | Ht 70.0 in | Wt 182.0 lb

## 2024-06-03 DIAGNOSIS — C155 Malignant neoplasm of lower third of esophagus: Secondary | ICD-10-CM | POA: Diagnosis not present

## 2024-06-03 DIAGNOSIS — K2289 Other specified disease of esophagus: Secondary | ICD-10-CM

## 2024-06-03 DIAGNOSIS — C159 Malignant neoplasm of esophagus, unspecified: Secondary | ICD-10-CM | POA: Diagnosis not present

## 2024-06-03 DIAGNOSIS — R1319 Other dysphagia: Secondary | ICD-10-CM

## 2024-06-03 DIAGNOSIS — K219 Gastro-esophageal reflux disease without esophagitis: Secondary | ICD-10-CM

## 2024-06-03 LAB — COMPREHENSIVE METABOLIC PANEL WITH GFR
ALT: 12 U/L (ref 3–53)
AST: 14 U/L (ref 5–37)
Albumin: 4.2 g/dL (ref 3.5–5.2)
Alkaline Phosphatase: 54 U/L (ref 39–117)
BUN: 12 mg/dL (ref 6–23)
CO2: 22 meq/L (ref 19–32)
Calcium: 9.3 mg/dL (ref 8.4–10.5)
Chloride: 107 meq/L (ref 96–112)
Creatinine, Ser: 0.96 mg/dL (ref 0.40–1.50)
GFR: 76.65 mL/min (ref >60.00)
Glucose, Bld: 98 mg/dL (ref 70–99)
Potassium: 3.9 meq/L (ref 3.5–5.1)
Sodium: 141 meq/L (ref 135–145)
Total Bilirubin: 0.4 mg/dL (ref 0.2–1.2)
Total Protein: 7.2 g/dL (ref 6.0–8.3)

## 2024-06-03 LAB — CBC WITH DIFFERENTIAL/PLATELET
Basophils Absolute: 0.1 K/uL (ref 0.0–0.1)
Basophils Relative: 1 % (ref 0.0–3.0)
Eosinophils Absolute: 0.4 K/uL (ref 0.0–0.7)
Eosinophils Relative: 4.9 % (ref 0.0–5.0)
HCT: 39.5 % (ref 39.0–52.0)
Hemoglobin: 12.9 g/dL — ABNORMAL LOW (ref 13.0–17.0)
Lymphocytes Relative: 28.8 % (ref 12.0–46.0)
Lymphs Abs: 2.3 K/uL (ref 0.7–4.0)
MCHC: 32.7 g/dL (ref 30.0–36.0)
MCV: 80.8 fl (ref 78.0–100.0)
Monocytes Absolute: 0.8 K/uL (ref 0.1–1.0)
Monocytes Relative: 9.3 % (ref 3.0–12.0)
Neutro Abs: 4.5 K/uL (ref 1.4–7.7)
Neutrophils Relative %: 56 % (ref 43.0–77.0)
Platelets: 325 K/uL (ref 150.0–400.0)
RBC: 4.89 Mil/uL (ref 4.22–5.81)
RDW: 16.9 % — ABNORMAL HIGH (ref 11.5–15.5)
WBC: 8.1 K/uL (ref 4.0–10.5)

## 2024-06-03 MED ORDER — SODIUM CHLORIDE 0.9 % IV SOLN: 500.0000 mL | Freq: Once | INTRAVENOUS | Status: DC

## 2024-06-03 NOTE — Progress Notes (Signed)
 Transferred to PACU via stretcher.  Not responding to stimulation at this time.  VSS upon leaving procedure room.

## 2024-06-03 NOTE — Progress Notes (Unsigned)
 Called to room to assist during endoscopic procedure.  Patient ID and intended procedure confirmed with present staff. Received instructions for my participation in the procedure from the performing physician.

## 2024-06-03 NOTE — Patient Instructions (Signed)
 YOU HAD AN ENDOSCOPIC PROCEDURE TODAY AT THE [facility]:   Refer to the procedure report that was given to you for any specific questions about what was found during the examination.  If the procedure report does not answer your questions, please call your gastroenterologist to clarify.  If you requested that your care partner not be given the details of your procedure findings, then the procedure report has been included in a sealed envelope for you to review at your convenience later.  YOU SHOULD EXPECT: Some feelings of bloating in the abdomen. Passage of more gas than usual.  Walking can help get rid of the air that was put into your GI tract during the procedure and reduce the bloating. If you had a lower endoscopy (such as a colonoscopy or flexible sigmoidoscopy) you may notice spotting of blood in your stool or on the toilet paper. If you underwent a bowel prep for your procedure, you may not have a normal bowel movement for a few days.  Please Note:  You might notice some irritation and congestion in your nose or some drainage.  This is from the oxygen used during your procedure.  There is no need for concern and it should clear up in a day or so.  SYMPTOMS TO REPORT IMMEDIATELY:  Following upper endoscopy (EGD)  Vomiting of blood or coffee ground material  New chest pain or pain under the shoulder blades  Painful or persistently difficult swallowing  New shortness of breath  Fever of 100F or higher  Black, tarry-looking stools   Resume previous full liquid diet Continue present medications Await pathology results. Hold off on barium swallow for tomorrow (barium may interfere with CT) Proceed with CT chest/abdomen/pelvis with contrast Oncology consultation asap.  May need XRT.  Hopefully can avoid esophageal stenting   For urgent or emergent issues, a gastroenterologist can be reached at any hour by calling [phone]. Do not use MyChart messaging for urgent  concerns.    DIET:  We do recommend a small meal at first, but then you may proceed to your regular diet.  Drink plenty of fluids but you should avoid alcoholic beverages for 24 hours.  ACTIVITY:  You should plan to take it easy for the rest of today and you should NOT DRIVE or use heavy machinery until tomorrow (because of the sedation medicines used during the test).    FOLLOW UP: Our staff will call the number listed on your records the next business day following your procedure.  We will call around 7:15- 8:00 am to check on you and address any questions or concerns that you may have regarding the information given to you following your procedure. If we do not reach you, we will leave a message.     If any biopsies were taken you will be contacted by phone or by letter within the next 1-3 weeks.  Please call us  at [phone] if you have not heard about the biopsies in 3 weeks.    SIGNATURES/CONFIDENTIALITY: You and/or your care partner have signed paperwork which will be entered into your electronic medical record.  These signatures attest to the fact that that the information above on your After Visit Summary has been reviewed and is understood.  Full responsibility of the confidentiality of this discharge information lies with you and/or your care-partner.

## 2024-06-03 NOTE — Progress Notes (Unsigned)
 Pt's states no medical or surgical changes since previsit or office visit.

## 2024-06-03 NOTE — Op Note (Addendum)
 [facility] Patient Name: Dustin Maynard Procedure Date: 06/03/2024 11:10 AM MRN: [id number] Endoscopist: Lynnie Bring , MD, [id number] Age: 78 Referring MD:  Date of Birth: 09/08/1945 Gender: Male Account #: [id number] Procedure:                Upper GI endoscopy Indications:              Dysphagia Medicines:                Monitored Anesthesia Care Procedure:                Pre-Anesthesia Assessment:                           - Prior to the procedure, a History and Physical                            was performed, and patient medications and                            allergies were reviewed. [name] tolerance of                            previous anesthesia was also reviewed. The risks                            and benefits of the procedure and the sedation                            options and risks were discussed with the patient.                            All questions were answered, and informed consent                            was obtained. Prior Anticoagulants: The patient has                            taken no anticoagulant or antiplatelet agents. ASA                            Grade Assessment: III - A patient with severe                            systemic disease. After reviewing the risks and                            benefits, the patient was deemed in satisfactory                            condition to undergo the procedure.                           After obtaining informed consent, the endoscope was  passed under direct vision. Throughout the                            procedure, the patient's blood pressure, pulse, and                            oxygen saturations were monitored continuously. The                            GIF [id number] #[id number] was introduced through the                            mouth, and advanced to the middle third of                            esophagus. The upper GI endoscopy was accomplished                             without difficulty. The patient tolerated the                            procedure well. Scope In: Scope Out: Findings:                 A large, circumferential, fungating and ulcerating                            mass was found in the lower third of the esophagus,                            30 cm from the incisors. The mass was partially                            obstructing and would not allow passage of EGD                            scope (diameter 9.9 mm). There was contact                            bleeding. Ultrathin EGD scope was not available in                            [facility]. Multiple biopsies were taken with a cold                            forceps for histology. Estimated blood loss was                            minimal. Complications:            No immediate complications. Estimated Blood Loss:     Estimated blood loss was minimal. 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 Biopsied. Recommendation:           - Patient has a contact number available for                            emergencies. The signs and symptoms of potential                            delayed complications were discussed with the                            patient. Return to normal activities tomorrow.                            Written discharge instructions were provided to the                            patient.                           - Resume previous full liquid diet.                           - Continue present medications.                           - Await pathology results. Sent [facility].                           - Hold off on barium swallow for tomorrow (barium                            may interfere with CT)                           - Proceed with CT chest/Abdo/pelvis with contrast.                           - Oncology  consultation ASAP. May need XRT.                            Hopefully can avoid esophageal stenting.                           - The findings and recommendations were discussed                            with the patient's family. Lynnie Bring, MD 06/03/2024 11:44:59 AM This report has been signed electronically.

## 2024-06-03 NOTE — Progress Notes (Unsigned)
 Expand All Collapse All     Chief Complaint:esophageal dysmotility Primary GI Doctor:Dr. Charlanne   HPI:  Dustin Maynard is a  78  year old male Dustin Maynard with past medical history of hypertension, who was referred to me by Tanda Bleacher, MD on 03/25/24 for a evaluation of esophageal dysmotility .     Interval History Dustin Maynard presents for evaluation of esophageal dysphagia, solids only.    Dustin Maynard reports intermittent issues for about a year. He reports currently occurs if he eats quickly and doesn't take his time. He does admit he is a fast eater. He reports worse with meat and high fibrous foods. He reports history of heartburn  and was taking OTC Tums. He reports since he has had the dysphagia the heartburn has went away.  Never had EGD. He does have intermittent epigastric discomfort where he has pressure in upper abdomen where something feels it needs to come back up. He will regurgitate the food. Appetite is good.    He denies altered BM's or blood in stool.   Former smoker, stopped 5 years ago. No alcohol use   Patients last colonoscopy was with [facility] approximately 12 years ago and per Dustin Maynard was normal. He reports doing Cologuard after that and negative. Not interested in any more colonoscopies.   Dustin Maynard's family history includes: sister with breast CA      Wt Readings from Last 3 Encounters:  05/27/24 182 lb 3.2 oz (82.6 kg)  03/03/24 173 lb 6.4 oz (78.7 kg)  07/21/13 165 lb 3.2 oz (74.9 kg)        Past Medical History:  Diagnosis Date   Hyperlipidemia     Hypertension            History reviewed. No pertinent surgical history.             Current Outpatient Medications  Medication Sig Dispense Refill   amLODipine (NORVASC) 10 MG tablet         Ascorbic Acid (VITAMIN C) 1000 MG tablet Take 500 mg by mouth daily.       Flaxseed, Linseed, (FLAX SEEDS PO) Take by mouth.       GARLIC PO Take by mouth.       lisinopril (PRINIVIL,ZESTRIL) 10 MG tablet          Multiple Vitamins-Minerals (CENTRUM SILVER PO) Take by mouth.       niacin 500 MG tablet Take 500 mg by mouth at bedtime.       sildenafil (REVATIO) 20 MG tablet Take 20 mg by mouth as needed.          No current facility-administered medications for this visit.           Allergies as of 05/27/2024   (Not on File)           Family History  Problem Relation Age of Onset   Heart disease Mother     Breast cancer Sister     Prostate cancer Brother            Review of Systems:    Constitutional: No weight loss, fever, chills, weakness or fatigue HEENT: Eyes: No change in vision               Ears, Nose, Throat:  No change in hearing or congestion Skin: No rash or itching Cardiovascular: No chest pain, chest pressure or palpitations   Respiratory: No SOB or cough Gastrointestinal: See HPI and otherwise negative Genitourinary: No dysuria or change in  urinary frequency Neurological: No headache, dizziness or syncope Musculoskeletal: No new muscle or joint pain Hematologic: No bleeding or bruising Psychiatric: No history of depression or anxiety      Physical Exam:  Vital signs: BP 126/80   Pulse 75   Ht 5' 10 (1.778 m)   Wt 182 lb 3.2 oz (82.6 kg)   SpO2 97%   BMI 26.14 kg/m    Constitutional:   Pleasant male appears to be in NAD, Well developed, Well nourished, alert and cooperative Eyes:   PEERL, EOMI. No icterus. Conjunctiva pink. Neck:  Supple Throat: Oral cavity and pharynx without inflammation, swelling or lesion.  Respiratory: Respirations even and unlabored. Lungs clear to auscultation bilaterally.   No wheezes, crackles, or rhonchi.  Cardiovascular: Normal S1, S2. Regular rate and rhythm. No peripheral edema, cyanosis or pallor.  Gastrointestinal:  Soft, nondistended, nontender. No rebound or guarding. Normal bowel sounds. No appreciable masses or hepatomegaly. Rectal:  Not performed.  Msk:  Symmetrical without gross deformities. Without edema, no deformity  or joint abnormality.  Neurologic:  Alert and  oriented x4;  grossly normal neurologically.  Skin:   Dry and intact without significant lesions or rashes.   RELEVANT LABS AND IMAGING:   03/15/24 MBSS FINDINGS: Vestibular Penetration: Occasional transient penetration seen with thin liquids.   Aspiration:  None seen.   Other: Moderate spondylosis of the cervical spine. Esophageal dysmotility with loss of the primary stripping wave. IMPRESSION: No significant penetration or aspiration seen.   Speech report: Pt presents with normal oropharyngeal swallow function. Esophageal sweep revealed barium retention and occasional backflow below the PES. Pt had normal oral function, occasional transient penetration (PAS of 2, considered WFL), and good pharyngeal clearance. Recommend consideration of esophagram (barium swallow) in light of pt's described symptoms. Continue current diet. No SLP f/u needed.      Assessment:     Encounter Diagnoses  Name Primary?   Esophageal dysmotility Yes   Esophageal dysphagia     Gastroesophageal reflux disease, unspecified whether esophagitis present       78 year old male Dustin Maynard who presents with esophageal dysphagia and remote history of GERD.  Most recent modified barium swallow study showed esophageal dysmotility with loss of the primary stripping wave. Discussed with Dr. Nandigam (Doc of Day), will proceed with both barium esophagram and EGD with possible dilatation in [facility] with Dr. Charlanne to rule out esophageal structural abnormalities, stricture, EOE, and/or Barrett's esophagus. If negative workup will consider impedence study and eso manometry.   Plan: - Dysphagia precautions -Order barium esophagagram  -Schedule EGD in [facility] with Dr. Charlanne. The risks and benefits of EGD with possible biopsies and esophageal dilation were discussed with the Dustin Maynard who agrees to proceed.     Thank you for the courtesy of this consult. Please call me with any questions or  concerns.    Deanna May, FNP-C [facility]       Attending physician's note   I have taken history, reviewed the chart and examined the Dustin Maynard. I performed a substantive portion of this encounter, including complete performance of at least one of the key components, in conjunction with the APP. I agree with the Advanced Practitioner's note, impression and recommendations.    Anselm Charlanne, MD [facility] [phone]

## 2024-06-04 ENCOUNTER — Telehealth: Payer: Self-pay | Admitting: Gastroenterology

## 2024-06-04 ENCOUNTER — Telehealth: Payer: Self-pay | Admitting: *Deleted

## 2024-06-04 ENCOUNTER — Other Ambulatory Visit (HOSPITAL_COMMUNITY)

## 2024-06-04 ENCOUNTER — Ambulatory Visit: Payer: Self-pay | Admitting: Gastroenterology

## 2024-06-04 DIAGNOSIS — C159 Malignant neoplasm of esophagus, unspecified: Secondary | ICD-10-CM

## 2024-06-04 LAB — SURGICAL PATHOLOGY

## 2024-06-04 NOTE — Telephone Encounter (Signed)
" °  Follow up Call-     06/03/2024   10:20 AM  Call back number  Post procedure Call Back phone  # [phone]  Permission to leave phone message Yes     Patient questions:  Do you have a fever, pain , or abdominal swelling? No. Pain Score  0 *  Have you tolerated food without any problems? Yes.    Have you been able to return to your normal activities? Yes.    Do you have any questions about your discharge instructions: Diet   No. Medications  No. Follow up visit  No.  Do you have questions or concerns about your Care? No.  Actions: * If pain score is 4 or above: No action needed, pain <4.   "

## 2024-06-04 NOTE — Telephone Encounter (Signed)
 Pt aware of appointment and confirmed at [facility] 06/07/2024

## 2024-06-04 NOTE — Addendum Note (Signed)
 Addended by: KATHIE BOTTCHER E on: 06/04/2024 12:01 PM   Modules accepted: Orders

## 2024-06-04 NOTE — Telephone Encounter (Signed)
 Esophageal biopsies-signet cell carcinoma I have discussed with patient's daughter Ruthanna in detail  Lyle, Please see EGD note Please set him up for CT chest Abdo pelvis with contrast ASAP Oncology consultation ASAP please  RG

## 2024-06-04 NOTE — Telephone Encounter (Signed)
 Radiology said just iv no oral. LVM  Appt for 12-22 at 2 arrival at 145pm at [facility] for check in. No prep needed but typically it is good not eat 2 hours prior just do liquids

## 2024-06-07 ENCOUNTER — Other Ambulatory Visit: Payer: Self-pay

## 2024-06-07 ENCOUNTER — Ambulatory Visit (HOSPITAL_COMMUNITY)
Admission: RE | Admit: 2024-06-07 | Discharge: 2024-06-07 | Disposition: A | Discharge status: Home / Self Care | Source: Ambulatory Visit | Attending: Gastroenterology | Admitting: Gastroenterology

## 2024-06-07 DIAGNOSIS — C159 Malignant neoplasm of esophagus, unspecified: Secondary | ICD-10-CM | POA: Diagnosis present

## 2024-06-07 DIAGNOSIS — C801 Malignant (primary) neoplasm, unspecified: Secondary | ICD-10-CM

## 2024-06-07 MED ORDER — IOHEXOL 300 MG/ML  SOLN
100.0000 mL | Freq: Once | INTRAMUSCULAR | Status: AC | PRN
  Administered 2024-06-07: 100 mL via INTRAVENOUS

## 2024-06-08 ENCOUNTER — Telehealth: Payer: Self-pay | Admitting: Radiation Oncology

## 2024-06-08 ENCOUNTER — Ambulatory Visit
Admission: RE | Admit: 2024-06-08 | Discharge: 2024-06-08 | Disposition: A | Discharge status: Home / Self Care | Source: Ambulatory Visit | Attending: Radiation Oncology | Admitting: Radiation Oncology

## 2024-06-08 ENCOUNTER — Ambulatory Visit: Payer: Self-pay | Admitting: Physician Assistant

## 2024-06-08 ENCOUNTER — Encounter: Payer: Self-pay | Admitting: Radiation Oncology

## 2024-06-08 VITALS — BP 131/84 | HR 80 | Temp 97.5°F | Resp 18 | Ht 70.0 in | Wt 177.6 lb

## 2024-06-08 DIAGNOSIS — C61 Malignant neoplasm of prostate: Secondary | ICD-10-CM | POA: Insufficient documentation

## 2024-06-08 DIAGNOSIS — Z87891 Personal history of nicotine dependence: Secondary | ICD-10-CM | POA: Insufficient documentation

## 2024-06-08 DIAGNOSIS — Z803 Family history of malignant neoplasm of breast: Secondary | ICD-10-CM | POA: Diagnosis not present

## 2024-06-08 DIAGNOSIS — R918 Other nonspecific abnormal finding of lung field: Secondary | ICD-10-CM | POA: Diagnosis not present

## 2024-06-08 DIAGNOSIS — Z8042 Family history of malignant neoplasm of prostate: Secondary | ICD-10-CM | POA: Insufficient documentation

## 2024-06-08 DIAGNOSIS — C155 Malignant neoplasm of lower third of esophagus: Secondary | ICD-10-CM | POA: Diagnosis not present

## 2024-06-08 DIAGNOSIS — I10 Essential (primary) hypertension: Secondary | ICD-10-CM | POA: Diagnosis not present

## 2024-06-08 DIAGNOSIS — E785 Hyperlipidemia, unspecified: Secondary | ICD-10-CM | POA: Insufficient documentation

## 2024-06-08 NOTE — Progress Notes (Signed)
 " Radiation Oncology         [phone] ________________________________  Name: Dustin Maynard MRN: [id number]  Date: 06/08/2024  DOB: [birth year]  RR:Tpodnw, Raguel, MD  Autumn Millman, MD     REFERRING PHYSICIAN: Autumn Millman, MD   DIAGNOSIS: The encounter diagnosis was Malignant neoplasm of lower third of esophagus (HCC).   HISTORY OF PRESENT ILLNESS::Dustin Maynard is a 78 y.o. male who is seen for an initial consultation visit regarding the patient's diagnosis of esophageal cancer.  The patient was noted to have some dysphagia and further workup included an EGD.  This revealed a large mass within the lower third of the esophagus.  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  The patient has undergone further imaging including a CT scan of the chest abdomen and pelvis.  Thickening involving the distal esophagus was seen as well as a paraesophageal lymph node.  Several small subcentimeter lung nodules are also present which are too small to easily characterize at this time.  I have been asked to see the patient today regarding the possibility of radiation treatment and the patient sees medical oncology tomorrow.  In terms of swallowing, the patient indicates he has not lost a significant amount of weight.  He continues to be able to eat most foods, including such foods as hamburgers if he is careful and cuts this up into smaller pieces.    PREVIOUS RADIATION THERAPY: No   PAST MEDICAL HISTORY:  has a past medical history of Hyperlipidemia and Hypertension.     PAST SURGICAL HISTORY: Past Surgical History:  Procedure Laterality Date   UPPER GASTROINTESTINAL ENDOSCOPY       FAMILY HISTORY: family history includes Breast cancer in his sister; Heart disease in his mother; Prostate cancer in his brother.   SOCIAL HISTORY:  reports that he quit smoking about 13 years ago. His smoking use included cigarettes. He has never used smokeless tobacco. He reports that he does not  drink alcohol and does not use drugs.   ALLERGIES: Patient has no allergy information on record.   MEDICATIONS:  Current Outpatient Medications  Medication Sig Dispense Refill   amLODipine (NORVASC) 10 MG tablet      Ascorbic Acid (VITAMIN C) 1000 MG tablet Take 500 mg by mouth daily.     Flaxseed, Linseed, (FLAX SEEDS PO) Take by mouth.     GARLIC PO Take by mouth.     lisinopril (PRINIVIL,ZESTRIL) 10 MG tablet      Multiple Vitamins-Minerals (CENTRUM SILVER PO) Take by mouth.     niacin 500 MG tablet Take 500 mg by mouth at bedtime.     sildenafil (REVATIO) 20 MG tablet Take 20 mg by mouth as needed.     No current facility-administered medications for this encounter.     REVIEW OF SYSTEMS:  A 15 point review of systems is documented in the electronic medical record. This was obtained by the nursing staff. However, I reviewed this with the patient to discuss relevant findings and make appropriate changes.  Pertinent items are noted in HPI.    PHYSICAL EXAM:  height is 5' 10 (1.778 m) and weight is 177 lb 9.6 oz (80.6 kg). His temperature is 97.5 F (36.4 C) (abnormal). His blood pressure is 131/84 and his pulse is 80. His respiration is 18 and oxygen saturation is 100%.   ECOG = 1  0 - Asymptomatic (Fully active, able to carry on all predisease activities without restriction)  1 -  Symptomatic but completely ambulatory (Restricted in physically strenuous activity but ambulatory and able to carry out work of a light or sedentary nature. For example, light housework, office work)  2 - Symptomatic, <50% in bed during the day (Ambulatory and capable of all self care but unable to carry out any work activities. Up and about more than 50% of waking hours)  3 - Symptomatic, >50% in bed, but not bedbound (Capable of only limited self-care, confined to bed or chair 50% or more of waking hours)  4 - Bedbound (Completely disabled. Cannot carry on any self-care. Totally confined to bed or  chair)  5 - Death   Raylene MM, Creech RH, Tormey DC, et al. [phone]). Toxicity and response criteria of the [facility]. Am. DOROTHA Bridges. Oncol. 5 (6): 649-55  Alert, no acute distress   LABORATORY DATA:  Lab Results  Component Value Date   WBC 8.1 06/03/2024   HGB 12.9 (L) 06/03/2024   HCT 39.5 06/03/2024   MCV 80.8 06/03/2024   PLT 325.0 06/03/2024   Lab Results  Component Value Date   NA 141 06/03/2024   K 3.9 06/03/2024   CL 107 06/03/2024   CO2 22 06/03/2024   Lab Results  Component Value Date   ALT 12 06/03/2024   AST 14 06/03/2024   ALKPHOS 54 06/03/2024   BILITOT 0.4 06/03/2024      RADIOGRAPHY: CT CHEST ABDOMEN PELVIS W CONTRAST Result Date: 06/07/2024 EXAM: CT CHEST, ABDOMEN AND PELVIS WITH CONTRAST 06/07/2024 02:28:01 PM TECHNIQUE: CT of the chest, abdomen and pelvis was performed with the administration of 100 mL of iohexol  (OMNIPAQUE ) 300 MG/ML solution. Multiplanar reformatted images are provided for review. Automated exposure control, iterative reconstruction, and/or weight based adjustment of the mA/kV was utilized to reduce the radiation dose to as low as reasonably achievable. COMPARISON: CT dated 05/05/2024. CLINICAL HISTORY: cancer of esophagus cancer of esophagus Dx'd Thursday last week - please insert all characters * Tracking Code: BO * into the clinical history FINDINGS: CHEST: MEDIASTINUM AND LYMPH NODES: Heart and pericardium are unremarkable. The central airways are clear. No hilar or axillary lymphadenopathy. ESOPHAGUS: Circumferential thickening of the distal esophagus leading up to the gastroesophageal junction. Thickening occurs at approximately 5 cm segment. There is a moderate sized slight type hiatal hernia. There is a paraesophageal lymph node measuring 7 mm on image 50/2 adjacent to the hiatal hernia. Thickened distal esophagus consistent with primary esophageal carcinoma. Single paraesophageal lymph node adjacent to the small  hiatal hernia. LUNGS AND PLEURA: Nodule in the right middle lobe on imaging 85 series 4. No nodule on comparison exam. Single small nodule in the anterior left lower lobe measures 4 mm on image 118 of series 4. This nodule measured 6 mm on comparison CT 05/05/2024. Right Middle lobe pulmonary nodule appears new from prior. Left lobe pulmonary nodule is stable. Indeterminate pulmonary nodules. Recommend close attention and follow up. No focal consolidation or pulmonary edema. No pleural effusion or pneumothorax. ABDOMEN AND PELVIS: LIVER: The liver is unremarkable. No hepatic lesion present. GALLBLADDER AND BILE DUCTS: Gallbladder is unremarkable. No biliary ductal dilatation. SPLEEN: No acute abnormality. PANCREAS: No acute abnormality. ADRENAL GLANDS: No acute abnormality. KIDNEYS, URETERS AND BLADDER: No stones in the kidneys or ureters. No hydronephrosis. No perinephric or periureteral stranding. Urinary bladder is unremarkable. GI AND BOWEL: Stomach demonstrates no acute abnormality. Duodenum and small bowel are normal. Short segment of small bowel intars shallow right inguinal hernia. Multiple diverticula of the  left colon without acute inflammation. There is no bowel obstruction. REPRODUCTIVE ORGANS: No acute abnormality. PERITONEUM AND RETROPERITONEUM: No ascites. No free air. VASCULATURE: Aorta is normal in caliber. ABDOMINAL AND PELVIS LYMPH NODES: No gastrohepatic ligament lymph nodes are identified. No periportal lymph nodes. No other abdominal or pelvic lymphadenopathy. No evidence of metastatic disease in the abdomen or pelvis. BONES AND SOFT TISSUES: No acute osseous abnormality. No focal soft tissue abnormality. No subcutaneous lymphadenopathy. IMPRESSION: 1. Circumferential thickening of the distal esophagus consistent with primary esophageal carcinoma, 2. Single paraesophageal lymph node adjacent to the small hiatal hernia. 3. Indeterminate pulmonary nodules, including a new right middle lobe nodule  and a decreased left lower lobe nodule. 4. No evidence of metastatic disease in the abdomen or pelvis. Electronically signed by: Norleen Boxer MD 06/07/2024 03:39 PM EST RP Workstation: [id number]       IMPRESSION/ PLAN:  The patient has a new diagnosis of locally advanced esophageal cancer within the lower third of the esophagus.  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  The patient continues to swallow reasonably well with some modifications.  I discussed with him that a feeding tube is sometimes placed but it does not appear that this is necessary for him currently.  We discussed possible treatment options today and I described in general the potential role of radiation treatment, chemotherapy, and surgical resection.  He sees medical oncology tomorrow.  In discussing these treatment options in general, the patient indicated that he was not eager to undergo surgery although I believe this is something that he would consider if it was felt absolutely necessary.  It would be very helpful for him to see medical oncology tomorrow and see what systemic treatment options are available for him.  Certainly 1 option would be to treat the patient with a definitive course of chemoradiation treatment, especially if he is hesitant to undergo surgery.  As this was his initial conversation regarding treatment, I believe he is still processing all of this information and we will see what his preferences are as well as we continue.  I have put in an order for a PET scan.  It appears that the patient has locally advanced disease, most likely T3 N1 M0 based on his current information, although we do not have an endoscopic ultrasound at this time.  Depending on pet imaging and further discussions about treatment options, this is something that could be ordered and also it may be helpful to get the patient to further discuss possible resection with cardiothoracic surgery, which I indicated to him today really would be  necessary for him to make any final decisions on this.  All of his questions were answered and he looks forward to seeing medical oncology tomorrow.  The patient was seen in person today in clinic.  The total time spent on the patient's visit today was 60 minutes, including chart review, direct discussion/evaluation with the patient, and coordination of care.      ________________________________   Norleen CANDIE Limes, MD, PhD   **Disclaimer: This note was dictated with voice recognition software. Similar sounding words can inadvertently be transcribed and this note may contain transcription errors which may not have been corrected upon publication of note.**  "

## 2024-06-08 NOTE — Telephone Encounter (Signed)
 Pt returned call and agreeable to consult 12/23.

## 2024-06-08 NOTE — Telephone Encounter (Signed)
LVM to schedule consult with Dr. Moody ?

## 2024-06-08 NOTE — Progress Notes (Signed)
 GI Location of Tumor / Histology: Esophagus  Elsie JAYSON Sharps presented with dysphagia.  CT CAP 06/07/2024: Circumferential thickening of the distal esophagus consistent with primary esophageal carcinoma, Single paraesophageal lymph node adjacent to the small hiatal hernia.  Indeterminate pulmonary nodules, including a new right middle lobe nodule and a decreased left lower lobe nodule.  No evidence of metastatic disease in the abdomen or pelvis.   Biopsies of Esophageal Mass 06/03/2024   Past/Anticipated interventions by surgeon, if any:    Past/Anticipated interventions by medical oncology, if any:  Dr. Autumn 06/09/2024   Weight changes, if any: Stable  Bowel/Bladder complaints, if any: No  Nausea / Vomiting, if any: He reports some vomiting after eating fast.  If he eats slower he has no trouble.  Pain issues, if any:  He reports some pain when he eats fast.  Appetite: Eating well. He has more trouble with very solid meats, as long as he chops his food into small pieces he can get it down with no issues.    SAFETY ISSUES: Prior radiation? No Pacemaker/ICD? No Possible current pregnancy? N/a Is the patient on methotrexate? No  Current Complaints/Details:

## 2024-06-09 ENCOUNTER — Encounter: Payer: Self-pay | Admitting: Oncology

## 2024-06-09 ENCOUNTER — Inpatient Hospital Stay: Attending: Oncology | Admitting: Oncology

## 2024-06-09 ENCOUNTER — Inpatient Hospital Stay

## 2024-06-09 VITALS — BP 120/74 | HR 75 | Temp 98.0°F | Resp 17 | Wt 180.7 lb

## 2024-06-09 DIAGNOSIS — Z803 Family history of malignant neoplasm of breast: Secondary | ICD-10-CM | POA: Diagnosis not present

## 2024-06-09 DIAGNOSIS — Z87891 Personal history of nicotine dependence: Secondary | ICD-10-CM | POA: Diagnosis not present

## 2024-06-09 DIAGNOSIS — C159 Malignant neoplasm of esophagus, unspecified: Secondary | ICD-10-CM | POA: Diagnosis not present

## 2024-06-09 DIAGNOSIS — C801 Malignant (primary) neoplasm, unspecified: Secondary | ICD-10-CM | POA: Insufficient documentation

## 2024-06-09 DIAGNOSIS — N5203 Combined arterial insufficiency and corporo-venous occlusive erectile dysfunction: Secondary | ICD-10-CM | POA: Insufficient documentation

## 2024-06-09 DIAGNOSIS — I10 Essential (primary) hypertension: Secondary | ICD-10-CM | POA: Diagnosis not present

## 2024-06-09 DIAGNOSIS — E7800 Pure hypercholesterolemia, unspecified: Secondary | ICD-10-CM | POA: Insufficient documentation

## 2024-06-09 DIAGNOSIS — Z8042 Family history of malignant neoplasm of prostate: Secondary | ICD-10-CM | POA: Insufficient documentation

## 2024-06-09 DIAGNOSIS — R918 Other nonspecific abnormal finding of lung field: Secondary | ICD-10-CM | POA: Insufficient documentation

## 2024-06-09 DIAGNOSIS — N433 Hydrocele, unspecified: Secondary | ICD-10-CM | POA: Insufficient documentation

## 2024-06-09 NOTE — Progress Notes (Signed)
 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  He presented with dysphagia but able to tolerate modified solid foods. Imaging reveals a large esophageal mass with regional lymphadenopathy and no distant metastases. Multidisciplinary management is planned, with initial concurrent chemoradiation.   Clinically appears to be T3, N1, M0 disease.  He had consultation with radiation oncology Dr. Rush Dewey on 06/08/2024.  Presented to our clinic to establish care on 06/09/2024.  Discussed diagnosis, treatment options, NCCN guidelines.  Recommended evaluation by cardiothoracic surgery, Dr. Lightfoot, in preparation for treatment planning.  Patient prefers to avoid surgery at this time but will consider further after discussing with Dr. Shyrl.  If he does not want to proceed with surgery, we will plan for definitive chemoradiation with weekly carboplatin/paclitaxel.  PET scan currently scheduled on 06/16/2024 to complete staging.  Based on findings, he can be referred for EUS for  accurate staging.  Surgical resection (esophagectomy) may be considered based on response and candidacy, offering the best chance for long-term remission but associated with significant operative morbidity and recovery. Maintenance immunotherapy or targeted therapy may be considered depending on genetic testing and response.   Risks of chemoradiation include fatigue, cytopenias, nausea, neuropathy, and infusion reactions; surgical risks include major morbidity and prolonged recovery.  - Planned next generation sequencing and genetic testing of tumor specimen to identify actionable mutations for future therapy.  - Planned consideration of esophagectomy following chemoradiation if he is a suitable candidate and demonstrates favorable tumor response. - Planned consideration of maintenance immunotherapy or targeted therapy based on genetic testing and response. - Planned palliative care involvement for symptom management as needed.  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  It is also relatively less chemosensitive, compared to conventional adenocarcinoma.  Pulmonary nodules Small pulmonary nodules were detected on CT, currently too small for biopsy and may not be visualized on PET. No evidence of metastatic disease at present, but ongoing surveillance is indicated given his malignancy. - Planned serial imaging to monitor pulmonary nodules for interval change. - Planned review of PET scan for metabolic activity in the nodules.  I reviewed lab results and outside records for this visit and discussed relevant results with the patient. Diagnosis, plan of care and treatment options were also discussed in detail with the patient. Opportunity provided to ask questions and answers provided to his apparent satisfaction. Provided instructions to call our clinic with any problems, questions or concerns prior to return visit. I recommended to  continue follow-up with PCP and sub-specialists. He verbalized understanding and agreed with the plan. No barriers to learning  was detected.  NCCN guidelines have been consulted in the planning of this patients care.  Dustin Patten, MD  06/09/2024 3:47 PM  [facility] - A DEPT OF [facility] [address] [place] [address] Dept: [phone] Dept Fax: [phone]   HISTORY OF PRESENTING ILLNESS:   I have reviewed his chart and materials related to his cancer extensively and collaborated history with the patient. Summary of oncologic history is as follows:  ONCOLOGY HISTORY:  Patient started having intermittent dysphagia, especially to solids approximately in early 2025.  He also had episodic epigastric discomfort.  With these symptoms, he was referred to gastroenterology and was seen by Dr. Charlanne.  EGD on 06/03/2024 showed large, circumferential, fungating and ulcerating mass in the lower third of the esophagus, 30 cm from the incisors.  Mass was partially obstructing and would not allow passage of EGD scope.  There was contact bleeding.  Multiple biopsies were taken with a cold forceps.  Pathology from the esophageal mass came back positive for signet ring cell carcinoma.  He had staging CT chest, abdomen and pelvis with contrast on 06/07/2024 which showed circumferential thickening of the distal esophagus consistent with primary esophageal carcinoma.  Single paraesophageal lymph node adjacent to the small hiatal hernia.  Tiny indeterminate pulmonary nodules including a new right middle lobe lung nodule and a decreased size of left lower lobe nodule, largest measuring 4 mm.  No other evidence of metastatic disease in the abdomen or pelvis.  Clinically appears to be T3, N1, M0 disease.  He had consultation with radiation oncology Dr. Norleen Limes on 06/08/2024.  Presented to our clinic to establish care on  06/09/2024.  Discussed diagnosis, treatment options, NCCN guidelines.  Recommended evaluation by cardiothoracic surgery, Dr. Lightfoot, in preparation for treatment planning.  Patient prefers to avoid surgery at this time but will consider further after discussing with Dr. Shyrl.  If he does not want to proceed with surgery, we will plan for definitive chemoradiation with weekly carboplatin/paclitaxel.  PET scan currently scheduled on 06/16/2024 to complete staging.  Based on findings, he can be referred for EUS for accurate staging.  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  It is also relatively less chemosensitive, compared to conventional adenocarcinoma.  Oncology History  Signet ring cell carcinoma of esophagus  06/09/2024 Initial Diagnosis   Signet ring cell carcinoma of esophagus   06/09/2024 Cancer Staging   Staging form: Exocrine Pancreas, AJCC 8th Edition - Clinical: Stage IIB (cT3, cN1, cM0) - Signed by Maynard Chinita, MD on 06/09/2024     INTERVAL HISTORY:  Discussed the use of AI scribe software for clinical note transcription with the patient, who gave verbal consent to proceed.  History of Present Illness KIARA KEEP is a 78 year old male with newly diagnosed signet ring cell carcinoma of the esophagus who presents for initial oncology consultation and treatment planning.  He has experienced progressive dysphagia over the past year, initially with a transient episode that resolved spontaneously, but since mid-2025 has had persistent symptoms with every meal. He describes a sensation of esophageal congestion when eating rapidly, necessitating slower eating and thorough mastication. Rapid ingestion leads to regurgitation. He remains able to tolerate solid foods, including peanut brittle, and has not required enteral feeding. He denies significant weight loss, odynophagia, hematemesis, persistent  hiccups, changes in bowel habits, melena, or abdominal pain.  The  patient reports that he was told he has signet ring cell carcinoma of the esophagus based on the results of an upper endoscopy performed on 06/03/2024. The patient reports that recent CT imaging showed a large mass in the esophagus, some lymph nodes nearby, and small nodules in the lungs, as communicated to him by his doctors. He has not experienced respiratory symptoms such as cough or shortness of breath.  He maintains oral intake with dietary modifications and has not required nutritional support. He lives alone with strong family support. His exercise tolerance has declined; he previously walked two miles every other day and now walks approximately two-thirds of a mile without claudication. He notes mild ankle edema, which he attributes to antihypertensive therapy. He denies chest pain, dyspnea, palpitations, or current sinus symptoms.   MEDICAL HISTORY:  Past Medical History:  Diagnosis Date   Hyperlipidemia    Hypertension     SURGICAL HISTORY: Past Surgical History:  Procedure Laterality Date   UPPER GASTROINTESTINAL ENDOSCOPY      SOCIAL HISTORY: He reports that he quit smoking about 13 years ago. His smoking use included cigarettes. He has never used smokeless tobacco. He reports that he does not drink alcohol and does not use drugs. Social History   Socioeconomic History   Marital status: Divorced    Spouse name: Not on file   Number of children: 2   Years of education: Not on file   Highest education level: Not on file  Occupational History   Not on file  Tobacco Use   Smoking status: Former    Current packs/day: 0.00    Types: Cigarettes    Quit date: 07/21/2010    Years since quitting: 13.8   Smokeless tobacco: Never  Vaping Use   Vaping status: Never Used  Substance and Sexual Activity   Alcohol use: No    Comment: very rarely   Drug use: No   Sexual activity: Not on file  Other Topics Concern    Not on file  Social History Narrative   Not on file   Social Drivers of Health   Tobacco Use: Medium Risk (06/08/2024)   Patient History    Smoking Tobacco Use: Former    Smokeless Tobacco Use: Never    Passive Exposure: Not on file  Financial Resource Strain: Low Risk (03/03/2024)   Overall Financial Resource Strain (CARDIA)    Difficulty of Paying Living Expenses: Not very hard  Food Insecurity: No Food Insecurity (03/03/2024)   Epic    Worried About Programme Researcher, Broadcasting/film/video in the Last Year: Never true    Ran Out of Food in the Last Year: Never true  Transportation Needs: No Transportation Needs (03/03/2024)   Epic    Lack of Transportation (Medical): No    Lack of Transportation (Non-Medical): No  Physical Activity: Insufficiently Active (03/03/2024)   Exercise Vital Sign    Days of Exercise per Week: 5 days    Minutes of Exercise per Session: 20 min  Stress: No Stress Concern Present (03/03/2024)   [employer] of Occupational Health - Occupational Stress Questionnaire    Feeling of Stress: Not at all  Social Connections: Socially Isolated (03/03/2024)   Social Connection and Isolation Panel    Frequency of Communication with Friends and Family: More than three times a week    Frequency of Social Gatherings with Friends and Family: Once a week    Attends Religious Services: Never    Database Administrator or Organizations: No  Attends Banker Meetings: Never    Marital Status: Divorced  Catering Manager Violence: Not At Risk (03/03/2024)   Epic    Fear of Current or Ex-Partner: No    Emotionally Abused: No    Physically Abused: No    Sexually Abused: No  Depression (PHQ2-9): Low Risk (06/09/2024)   Depression (PHQ2-9)    PHQ-2 Score: 0  Alcohol Screen: Low Risk (03/03/2024)   Alcohol Screen    Last Alcohol Screening Score (AUDIT): 0  Housing: Low Risk (03/03/2024)   Epic    Unable to Pay for Housing in the Last Year: No    Number of Times Moved in the  Last Year: 0    Homeless in the Last Year: No  Utilities: Not At Risk (03/03/2024)   Epic    Threatened with loss of utilities: No  Health Literacy: Adequate Health Literacy (03/03/2024)   B1300 Health Literacy    Frequency of need for help with medical instructions: Never    FAMILY HISTORY: Family History  Problem Relation Age of Onset   Heart disease Mother    Breast cancer Sister    Prostate cancer Brother    Colon cancer Neg Hx    Esophageal cancer Neg Hx    Rectal cancer Neg Hx    Stomach cancer Neg Hx     ALLERGIES:  He has no allergies on file.  MEDICATIONS:  Current Outpatient Medications  Medication Sig Dispense Refill   amLODipine (NORVASC) 10 MG tablet      Ascorbic Acid (VITAMIN C) 1000 MG tablet Take 500 mg by mouth daily.     Flaxseed, Linseed, (FLAX SEEDS PO) Take by mouth.     GARLIC PO Take by mouth.     lisinopril (PRINIVIL,ZESTRIL) 10 MG tablet      Multiple Vitamins-Minerals (CENTRUM SILVER PO) Take by mouth.     niacin 500 MG tablet Take 500 mg by mouth at bedtime.     sildenafil (REVATIO) 20 MG tablet Take 20 mg by mouth as needed.     No current facility-administered medications for this visit.    REVIEW OF SYSTEMS:    Review of Systems - Oncology  All other pertinent systems were reviewed with the patient and are negative.  PHYSICAL EXAMINATION:   Onc Performance Status - 06/09/24 1011       ECOG Perf Status   ECOG Perf Status Fully active, able to carry on all pre-disease performance without restriction      KPS SCALE   KPS % SCORE Able to carry on normal activity, minor s/s of disease          Vitals:   06/09/24 1011  BP: 120/74  Pulse: 75  Resp: 17  Temp: 98 F (36.7 C)  SpO2: 100%   Filed Weights   06/09/24 1011  Weight: 180 lb 11.2 oz (82 kg)    Physical Exam Constitutional:      General: He is not in acute distress.    Appearance: Normal appearance.  HENT:     Head: Normocephalic and atraumatic.  Eyes:      Conjunctiva/sclera: Conjunctivae normal.  Cardiovascular:     Rate and Rhythm: Normal rate and regular rhythm.     Heart sounds: Normal heart sounds.  Pulmonary:     Effort: Pulmonary effort is normal. No respiratory distress.     Breath sounds: Normal breath sounds.  Abdominal:     General: There is no distension.  Lymphadenopathy:  Cervical: No cervical adenopathy.  Neurological:     General: No focal deficit present.     Mental Status: He is alert and oriented to person, place, and time.  Psychiatric:        Mood and Affect: Mood normal.        Behavior: Behavior normal.      LABORATORY DATA:   I have reviewed the data as listed.  No results found for any visits on 06/09/24.  Lab Results  Component Value Date   WBC 8.1 06/03/2024   HGB 12.9 (L) 06/03/2024   HCT 39.5 06/03/2024   MCV 80.8 06/03/2024   PLT 325.0 06/03/2024   Recent Labs    06/03/24 1227  NA 141  K 3.9  CL 107  CO2 22  GLUCOSE 98  BUN 12  CREATININE 0.96  CALCIUM 9.3  PROT 7.2  ALBUMIN 4.2  AST 14  ALT 12  ALKPHOS 54  BILITOT 0.4     RADIOGRAPHIC STUDIES:  I have personally reviewed the radiological images as listed and agree with the findings in the report.  CT CHEST ABDOMEN PELVIS W CONTRAST Result Date: 06/07/2024 EXAM: CT CHEST, ABDOMEN AND PELVIS WITH CONTRAST 06/07/2024 02:28:01 PM TECHNIQUE: CT of the chest, abdomen and pelvis was performed with the administration of 100 mL of iohexol  (OMNIPAQUE ) 300 MG/ML solution. Multiplanar reformatted images are provided for review. Automated exposure control, iterative reconstruction, and/or weight based adjustment of the mA/kV was utilized to reduce the radiation dose to as low as reasonably achievable. COMPARISON: CT dated 05/05/2024. CLINICAL HISTORY: cancer of esophagus cancer of esophagus Dx'd Thursday last week - please insert all characters * Tracking Code: BO * into the clinical history FINDINGS: CHEST: MEDIASTINUM AND LYMPH  NODES: Heart and pericardium are unremarkable. The central airways are clear. No hilar or axillary lymphadenopathy. ESOPHAGUS: Circumferential thickening of the distal esophagus leading up to the gastroesophageal junction. Thickening occurs at approximately 5 cm segment. There is a moderate sized slight type hiatal hernia. There is a paraesophageal lymph node measuring 7 mm on image 50/2 adjacent to the hiatal hernia. Thickened distal esophagus consistent with primary esophageal carcinoma. Single paraesophageal lymph node adjacent to the small hiatal hernia. LUNGS AND PLEURA: Nodule in the right middle lobe on imaging 85 series 4. No nodule on comparison exam. Single small nodule in the anterior left lower lobe measures 4 mm on image 118 of series 4. This nodule measured 6 mm on comparison CT 05/05/2024. Right Middle lobe pulmonary nodule appears new from prior. Left lobe pulmonary nodule is stable. Indeterminate pulmonary nodules. Recommend close attention and follow up. No focal consolidation or pulmonary edema. No pleural effusion or pneumothorax. ABDOMEN AND PELVIS: LIVER: The liver is unremarkable. No hepatic lesion present. GALLBLADDER AND BILE DUCTS: Gallbladder is unremarkable. No biliary ductal dilatation. SPLEEN: No acute abnormality. PANCREAS: No acute abnormality. ADRENAL GLANDS: No acute abnormality. KIDNEYS, URETERS AND BLADDER: No stones in the kidneys or ureters. No hydronephrosis. No perinephric or periureteral stranding. Urinary bladder is unremarkable. GI AND BOWEL: Stomach demonstrates no acute abnormality. Duodenum and small bowel are normal. Short segment of small bowel intars shallow right inguinal hernia. Multiple diverticula of the left colon without acute inflammation. There is no bowel obstruction. REPRODUCTIVE ORGANS: No acute abnormality. PERITONEUM AND RETROPERITONEUM: No ascites. No free air. VASCULATURE: Aorta is normal in caliber. ABDOMINAL AND PELVIS LYMPH NODES: No gastrohepatic  ligament lymph nodes are identified. No periportal lymph nodes. No other abdominal or pelvic lymphadenopathy. No evidence  of metastatic disease in the abdomen or pelvis. BONES AND SOFT TISSUES: No acute osseous abnormality. No focal soft tissue abnormality. No subcutaneous lymphadenopathy. IMPRESSION: 1. Circumferential thickening of the distal esophagus consistent with primary esophageal carcinoma, 2. Single paraesophageal lymph node adjacent to the small hiatal hernia. 3. Indeterminate pulmonary nodules, including a new right middle lobe nodule and a decreased left lower lobe nodule. 4. No evidence of metastatic disease in the abdomen or pelvis. Electronically signed by: Norleen Boxer MD 06/07/2024 03:39 PM EST RP Workstation: [id number]     CODE STATUS:   Future Appointments  Date Time Provider Department Center  06/16/2024 10:00 AM [facility]  06/23/2024  2:30 PM [facility] None  06/23/2024  3:00 PM Kennedy Brines, MD [facility] None  06/25/2024 10:40 AM Lightfoot, Linnie KIDD, MD [facility]     I spent a total of 70 minutes during this encounter with the patient including review of chart and various tests results, discussions about plan of care and coordination of care plan.  This document was completed utilizing speech recognition software. Grammatical errors, random word insertions, pronoun errors, and incomplete sentences are an occasional consequence of this system due to software limitations, ambient noise, and hardware issues. Any formal questions or concerns about the content, text or information contained within the body of this dictation should be directly addressed to the provider for clarification.  "

## 2024-06-09 NOTE — Progress Notes (Addendum)
 PATIENT NAVIGATOR PROGRESS NOTE  Name: Dustin Maynard Date: 06/09/2024 MRN: [id number]  DOB: September 17, 1945   Reason for visit:  Initial Med/Onc Appt with Dr. Chinita Pasam  Comments:   Patient was seen during his initial appt with Dr. Autumn.  Patient was accompanied by his son and daughter. Patient was given Journey's binder with information specific to his diagnosis. Patient was given my direct contact information and was instructed to contact office with any quesitons or concerns after today.   Referrals entered for Nutrition, Social Work, and Surgical Consult with Dr. Shyrl. Orders entered for Port insertion.    Time spent counseling/coordinating care: > 60 minutes

## 2024-06-09 NOTE — Assessment & Plan Note (Addendum)
 Please review oncology history for additional details and timeline of events.  He presented with dysphagia but able to tolerate modified solid foods. Imaging reveals a large esophageal mass with regional lymphadenopathy and no distant metastases. Multidisciplinary management is planned, with initial concurrent chemoradiation.   Clinically appears to be T3, N1, M0 disease.  He had consultation with radiation oncology Dr. Norleen Limes on 06/08/2024.  Presented to our clinic to establish care on 06/09/2024.  Discussed diagnosis, treatment options, NCCN guidelines.  Recommended evaluation by cardiothoracic surgery, Dr. Lightfoot, in preparation for treatment planning.  Patient prefers to avoid surgery at this time but will consider further after discussing with Dr. Shyrl.  If he does not want to proceed with surgery, we will plan for definitive chemoradiation with weekly carboplatin/paclitaxel.  PET scan currently scheduled on 06/16/2024 to complete staging.  Based on findings, he can be referred for EUS for accurate staging.  Surgical resection (esophagectomy) may be considered based on response and candidacy, offering the best chance for long-term remission but associated with significant operative morbidity and recovery. Maintenance immunotherapy or targeted therapy may be considered depending on genetic testing and response.   Risks of chemoradiation include fatigue, cytopenias, nausea, neuropathy, and infusion reactions; surgical risks include major morbidity and prolonged recovery.  - Planned next generation sequencing and genetic testing of tumor specimen to identify actionable mutations for future therapy.  - Planned consideration of esophagectomy following chemoradiation if he is a suitable candidate and demonstrates favorable tumor response. - Planned consideration of maintenance immunotherapy or targeted therapy based on genetic testing and response. - Planned palliative care  involvement for symptom management as needed.  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  It is also relatively less chemosensitive, compared to conventional adenocarcinoma.

## 2024-06-09 NOTE — Addendum Note (Signed)
 Addended by: Mazel Villela L on: 06/09/2024 11:55 AM   Modules accepted: Orders

## 2024-06-14 ENCOUNTER — Inpatient Hospital Stay

## 2024-06-14 ENCOUNTER — Telehealth: Payer: Self-pay | Admitting: Oncology

## 2024-06-14 NOTE — Telephone Encounter (Signed)
 Contacted pt to schedule an appt per Referral/Authorization: [id number] . Unable to reach via phone, voicemail was left.

## 2024-06-14 NOTE — Progress Notes (Signed)
 [facility] Clinical Social Work  Initial Assessment   Dustin Maynard is a 78 y.o. year old male contacted by phone. Clinical Social Work was referred by nurse navigator for assessment of psychosocial needs.   SDOH (Social Determinants of Health) assessments performed: Yes   SDOH Screenings   Food Insecurity: No Food Insecurity (03/03/2024)  Housing: Low Risk (03/03/2024)  Transportation Needs: No Transportation Needs (03/03/2024)  Utilities: Not At Risk (03/03/2024)  Alcohol Screen: Low Risk (03/03/2024)  Depression (PHQ2-9): Low Risk (06/09/2024)  Financial Resource Strain: Low Risk (03/03/2024)  Physical Activity: Insufficiently Active (03/03/2024)  Social Connections: Socially Isolated (03/03/2024)  Stress: No Stress Concern Present (03/03/2024)  Tobacco Use: Medium Risk (06/09/2024)  Health Literacy: Adequate Health Literacy (03/03/2024)    PHQ 2/9:    06/09/2024   10:11 AM 03/03/2024    3:47 PM  Depression screen PHQ 2/9  Decreased Interest 0 0  Down, Depressed, Hopeless 0 0  PHQ - 2 Score 0 0  Altered sleeping  0  Tired, decreased energy  1  Change in appetite  0  Feeling bad or failure about yourself   0  Trouble concentrating  0  Moving slowly or fidgety/restless  1  Suicidal thoughts  0  PHQ-9 Score  2   Difficult doing work/chores  Not difficult at all     Data saved with a previous flowsheet row definition     Distress Screen completed: No     No data to display            Family/Social Information:  Housing Arrangement: patient lives alone Family members/support persons in your life? Family Transportation concerns: no  Employment: Retired.  Income source: Actor concerns: No Type of concern: None Food access concerns: no Religious or spiritual practice: Yes-[employer] Advanced directives: No Services Currently in place:    Coping/ Adjustment to diagnosis: Patient understands treatment plan and what happens next?  yes Concerns about diagnosis and/or treatment: I'm not especially worried about anything Patient reported stressors: None reported. Expected adjustment to diagnosis. Current coping skills/ strengths: Ability for insight , Active sense of humor , Average or above average intelligence , Capable of independent living , Communication skills , Contractor , General fund of knowledge , Motivation for treatment/growth , Physical Health , Religious Affiliation , and Supportive family/friends     SUMMARY: Current SDOH Barriers:  No SDOH barriers  Clinical Social Work Clinical Goal(s):  No clinical social work goals at this time  Interventions: Discussed common feeling and emotions when being diagnosed with cancer, and the importance of support during treatment Informed patient of the support team roles and support services at [facility] Provided CSW contact information and encouraged patient to call with any questions or concerns   Follow Up Plan: Patient will contact CSW with any support or resource needs Patient verbalizes understanding of plan: Yes    Lizbeth Sprague, LCSW Clinical Social Worker [facility] [phone]

## 2024-06-15 ENCOUNTER — Telehealth: Payer: Self-pay

## 2024-06-15 DIAGNOSIS — C801 Malignant (primary) neoplasm, unspecified: Secondary | ICD-10-CM

## 2024-06-15 MED ORDER — MEXILETINE HCL 200 MG PO CAPS: 200.0000 mg | ORAL_CAPSULE | Freq: Two times a day (BID) | ORAL | 3 refills | Status: AC

## 2024-06-15 NOTE — Progress Notes (Signed)
 Guardant 360 orders entered per Dr. Clovis request.

## 2024-06-15 NOTE — Telephone Encounter (Signed)
 Pt called in stating that he needs a refill on his Mexitil 

## 2024-06-15 NOTE — Telephone Encounter (Signed)
 Pt's medication was sent to pt's pharmacy as requested. Confirmation received.

## 2024-06-16 ENCOUNTER — Encounter
Admission: RE | Admit: 2024-06-16 | Discharge: 2024-06-16 | Disposition: A | Source: Ambulatory Visit | Attending: Radiation Oncology | Admitting: Radiation Oncology

## 2024-06-16 DIAGNOSIS — C155 Malignant neoplasm of lower third of esophagus: Secondary | ICD-10-CM | POA: Insufficient documentation

## 2024-06-16 LAB — GLUCOSE, CAPILLARY: Glucose-Capillary: 93 mg/dL (ref 70–99)

## 2024-06-16 MED ORDER — FLUDEOXYGLUCOSE F - 18 (FDG) INJECTION
10.1400 | Freq: Once | INTRAVENOUS | Status: AC | PRN
  Administered 2024-06-16: 10.14 via INTRAVENOUS

## 2024-06-18 ENCOUNTER — Other Ambulatory Visit: Payer: Self-pay | Admitting: Radiology

## 2024-06-18 NOTE — H&P (Signed)
 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  He is scheduled today for Port-A-Cath placement to assist with treatment.   Patient is Full Code  Past Medical History:  Diagnosis Date   Hyperlipidemia    Hypertension     Past Surgical History:  Procedure Laterality Date   UPPER GASTROINTESTINAL ENDOSCOPY      Allergies: Patient has no allergy information on record.  Medications: Prior to Admission medications  Medication Sig Start Date End Date Taking? Authorizing Provider  amLODipine (NORVASC) 10 MG tablet  07/04/13   [provider]  Ascorbic Acid (VITAMIN C) 1000 MG tablet Take 500 mg by mouth daily.    [provider]  Flaxseed, Linseed, (FLAX SEEDS PO) Take by mouth.    [provider]  GARLIC PO Take by mouth.    [provider]  lisinopril (PRINIVIL,ZESTRIL) 10 MG tablet  07/04/13   [provider]  Multiple Vitamins-Minerals (CENTRUM SILVER PO) Take by mouth.    [provider]  niacin 500 MG tablet Take 500 mg by mouth at bedtime.    [provider]  sildenafil (REVATIO) 20 MG tablet Take 20 mg by mouth as needed. 04/13/14   [provider]     Family History  Problem Relation Age of Onset   Heart disease Mother    Breast cancer Sister    Prostate cancer Brother    Colon cancer Neg Hx    Esophageal cancer Neg Hx    Rectal cancer Neg Hx    Stomach cancer Neg Hx     Social History   Socioeconomic History   Marital status: Divorced    Spouse name: Not on  file   Number of children: 2   Years of education: Not on file   Highest education level: Not on file  Occupational History   Not on file  Tobacco Use   Smoking status: Former    Current packs/day: 0.00    Types: Cigarettes    Quit date: 07/21/2010    Years since quitting: 13.9   Smokeless tobacco: Never  Vaping Use   Vaping status: Never Used  Substance and Sexual Activity   Alcohol use: No    Comment: very rarely   Drug use: No   Sexual activity: Not on file  Other Topics Concern   Not on file  Social History Narrative   Not on file   Social Drivers of Health   Tobacco Use: Medium Risk (06/09/2024)   Patient History    Smoking Tobacco Use: Former    Smokeless Tobacco Use: Never    Passive Exposure: Not on file  Financial Resource Strain: Low Risk (03/03/2024)   Overall Financial Resource Strain (CARDIA)    Difficulty of Paying Living Expenses: Not very hard  Food Insecurity: No Food Insecurity (03/03/2024)   Epic    Worried About Radiation Protection Practitioner of Food in the Last Year: Never true    Ran Out of Food in the Last Year: Never true  Transportation Needs: No Transportation Needs (03/03/2024)   Epic    Lack of Transportation (Medical): No  Lack of Transportation (Non-Medical): No  Physical Activity: Insufficiently Active (03/03/2024)   Exercise Vital Sign    Days of Exercise per Week: 5 days    Minutes of Exercise per Session: 20 min  Stress: No Stress Concern Present (03/03/2024)   [employer] of Occupational Health - Occupational Stress Questionnaire    Feeling of Stress: Not at all  Social Connections: Socially Isolated (03/03/2024)   Social Connection and Isolation Panel    Frequency of Communication with Friends and Family: More than three times a week    Frequency of Social Gatherings with Friends and Family: Once a week    Attends Religious Services: Never    Database Administrator or Organizations: No    Attends Banker Meetings: Never    Marital  Status: Divorced  Depression (PHQ2-9): Low Risk (06/09/2024)   Depression (PHQ2-9)    PHQ-2 Score: 0  Alcohol Screen: Low Risk (03/03/2024)   Alcohol Screen    Last Alcohol Screening Score (AUDIT): 0  Housing: Low Risk (03/03/2024)   Epic    Unable to Pay for Housing in the Last Year: No    Number of Times Moved in the Last Year: 0    Homeless in the Last Year: No  Utilities: Not At Risk (03/03/2024)   Epic    Threatened with loss of utilities: No  Health Literacy: Adequate Health Literacy (03/03/2024)   B1300 Health Literacy    Frequency of need for help with medical instructions: Never       Review of Systems denies fever,HA,CP,dyspnea, cough, abd pain,N/V or bleeding; he does have some back pain, occ dysphagia  Vital Signs: temp 98.3 BP 124/83  HR 78  R 18  O2 SATS 98%    Advance Care Plan: no documents on file  Physical Exam; awake/alert; chest- CTA bilat; heart- RRR; abd-soft,+BS,NT; no LE edema  Imaging: CT CHEST ABDOMEN PELVIS W CONTRAST Result Date: 06/07/2024 EXAM: CT CHEST, ABDOMEN AND PELVIS WITH CONTRAST 06/07/2024 02:28:01 PM TECHNIQUE: CT of the chest, abdomen and pelvis was performed with the administration of 100 mL of iohexol  (OMNIPAQUE ) 300 MG/ML solution. Multiplanar reformatted images are provided for review. Automated exposure control, iterative reconstruction, and/or weight based adjustment of the mA/kV was utilized to reduce the radiation dose to as low as reasonably achievable. COMPARISON: CT dated 05/05/2024. CLINICAL HISTORY: cancer of esophagus cancer of esophagus Dx'd Thursday last week - please insert all characters * Tracking Code: BO * into the clinical history FINDINGS: CHEST: MEDIASTINUM AND LYMPH NODES: Heart and pericardium are unremarkable. The central airways are clear. No hilar or axillary lymphadenopathy. ESOPHAGUS: Circumferential thickening of the distal esophagus leading up to the gastroesophageal junction. Thickening occurs at approximately  5 cm segment. There is a moderate sized slight type hiatal hernia. There is a paraesophageal lymph node measuring 7 mm on image 50/2 adjacent to the hiatal hernia. Thickened distal esophagus consistent with primary esophageal carcinoma. Single paraesophageal lymph node adjacent to the small hiatal hernia. LUNGS AND PLEURA: Nodule in the right middle lobe on imaging 85 series 4. No nodule on comparison exam. Single small nodule in the anterior left lower lobe measures 4 mm on image 118 of series 4. This nodule measured 6 mm on comparison CT 05/05/2024. Right Middle lobe pulmonary nodule appears new from prior. Left lobe pulmonary nodule is stable. Indeterminate pulmonary nodules. Recommend close attention and follow up. No focal consolidation or pulmonary edema. No pleural effusion or pneumothorax. ABDOMEN AND PELVIS: LIVER: The  liver is unremarkable. No hepatic lesion present. GALLBLADDER AND BILE DUCTS: Gallbladder is unremarkable. No biliary ductal dilatation. SPLEEN: No acute abnormality. PANCREAS: No acute abnormality. ADRENAL GLANDS: No acute abnormality. KIDNEYS, URETERS AND BLADDER: No stones in the kidneys or ureters. No hydronephrosis. No perinephric or periureteral stranding. Urinary bladder is unremarkable. GI AND BOWEL: Stomach demonstrates no acute abnormality. Duodenum and small bowel are normal. Short segment of small bowel intars shallow right inguinal hernia. Multiple diverticula of the left colon without acute inflammation. There is no bowel obstruction. REPRODUCTIVE ORGANS: No acute abnormality. PERITONEUM AND RETROPERITONEUM: No ascites. No free air. VASCULATURE: Aorta is normal in caliber. ABDOMINAL AND PELVIS LYMPH NODES: No gastrohepatic ligament lymph nodes are identified. No periportal lymph nodes. No other abdominal or pelvic lymphadenopathy. No evidence of metastatic disease in the abdomen or pelvis. BONES AND SOFT TISSUES: No acute osseous abnormality. No focal soft tissue abnormality. No  subcutaneous lymphadenopathy. IMPRESSION: 1. Circumferential thickening of the distal esophagus consistent with primary esophageal carcinoma, 2. Single paraesophageal lymph node adjacent to the small hiatal hernia. 3. Indeterminate pulmonary nodules, including a new right middle lobe nodule and a decreased left lower lobe nodule. 4. No evidence of metastatic disease in the abdomen or pelvis. Electronically signed by: Norleen Boxer MD 06/07/2024 03:39 PM EST RP Workstation: [id number]    Labs:  CBC: Recent Labs    06/03/24 1227  WBC 8.1  HGB 12.9*  HCT 39.5  PLT 325.0    COAGS: No results for input(s): INR, APTT in the last 8760 hours.  BMP: Recent Labs    06/03/24 1227  NA 141  K 3.9  CL 107  CO2 22  GLUCOSE 98  BUN 12  CALCIUM 9.3  CREATININE 0.96    LIVER FUNCTION TESTS: Recent Labs    06/03/24 1227  BILITOT 0.4  AST 14  ALT 12  ALKPHOS 54  PROT 7.2  ALBUMIN 4.2    TUMOR MARKERS: No results for input(s): AFPTM, CEA, CA199, CHROMGRNA in the last 8760 hours.  Assessment and Plan: 79 y.o. 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  He is scheduled today for Port-A-Cath placement to assist with treatment.Risks and benefits of image guided port-a-catheter placement was discussed with the patient including, but not limited to bleeding, infection, pneumothorax, or fibrin sheath development and need for additional procedures.  All of the patient's questions were answered, patient is agreeable to proceed. Consent signed and in chart.    Thank you for allowing our service to participate in Dustin Maynard 's care.  Electronically Signed: D. Franky Rakers, PA-C   06/18/2024, 1:39 PM      I spent a total of  20 minutes   in face to face in clinical consultation, greater than 50% of which was counseling/coordinating care for  port a cath placement   "

## 2024-06-21 ENCOUNTER — Encounter (HOSPITAL_COMMUNITY): Payer: Self-pay

## 2024-06-21 ENCOUNTER — Ambulatory Visit (HOSPITAL_COMMUNITY)
Admission: RE | Admit: 2024-06-21 | Discharge: 2024-06-21 | Disposition: A | Discharge status: Home / Self Care | Source: Ambulatory Visit | Attending: Oncology | Admitting: Oncology

## 2024-06-21 ENCOUNTER — Other Ambulatory Visit: Payer: Self-pay

## 2024-06-21 DIAGNOSIS — Z87891 Personal history of nicotine dependence: Secondary | ICD-10-CM | POA: Insufficient documentation

## 2024-06-21 DIAGNOSIS — C801 Malignant (primary) neoplasm, unspecified: Secondary | ICD-10-CM | POA: Diagnosis present

## 2024-06-21 HISTORY — PX: IR IMAGING GUIDED PORT INSERTION: IMG5740

## 2024-06-21 MED ORDER — FENTANYL CITRATE (PF) 100 MCG/2ML IJ SOLN
INTRAMUSCULAR | Status: AC
  Filled 2024-06-21: qty 2

## 2024-06-21 MED ORDER — FENTANYL CITRATE (PF) 100 MCG/2ML IJ SOLN
INTRAMUSCULAR | Status: AC | PRN
  Administered 2024-06-21: 50 ug via INTRAVENOUS

## 2024-06-21 MED ORDER — MIDAZOLAM HCL (PF) 2 MG/2ML IJ SOLN
INTRAMUSCULAR | Status: AC | PRN
  Administered 2024-06-21: 1 mg via INTRAVENOUS

## 2024-06-21 MED ORDER — MIDAZOLAM HCL 2 MG/2ML IJ SOLN
INTRAMUSCULAR | Status: AC
  Filled 2024-06-21: qty 2

## 2024-06-21 MED ORDER — HEPARIN SOD (PORK) LOCK FLUSH 100 UNIT/ML IV SOLN
500.0000 [IU] | Freq: Once | INTRAVENOUS | Status: AC
  Administered 2024-06-21: 500 [IU] via INTRAVENOUS

## 2024-06-21 MED ORDER — LIDOCAINE HCL 1 % IJ SOLN
INTRAMUSCULAR | Status: AC
  Filled 2024-06-21: qty 20

## 2024-06-21 MED ORDER — HEPARIN SOD (PORK) LOCK FLUSH 100 UNIT/ML IV SOLN
INTRAVENOUS | Status: AC
  Filled 2024-06-21: qty 5

## 2024-06-21 MED ORDER — SODIUM CHLORIDE 0.9 % IV SOLN: INTRAVENOUS | Status: DC

## 2024-06-21 MED ORDER — LIDOCAINE HCL 1 % IJ SOLN
20.0000 mL | Freq: Once | INTRAMUSCULAR | Status: AC
  Administered 2024-06-21: 20 mL via INTRADERMAL

## 2024-06-21 NOTE — Discharge Instructions (Addendum)

## 2024-06-21 NOTE — Procedures (Signed)
 Interventional Radiology Procedure Note  Procedure: Single Lumen Power Port Placement    Access:  Right IJ vein.  Findings: Catheter tip positioned at SVC/RA junction. Port is ready for immediate use.   Complications: None  EBL: < 10 mL  Recommendations:  - Ok to shower in 24 hours - Do not submerge for 7 days - Routine line care   Maxi Carreras T. Fredia Sorrow, M.D Pager:  [phone]

## 2024-06-21 NOTE — Progress Notes (Unsigned)
 "     [address] Dustin Maynard [place] [id number]             [phone]                   Dustin CORTESE [facility] Medical Record #[id number] Date of Birth: 04/20/1946  Referring: Dustin Bleacher, MD Primary Care: Dustin Bleacher, MD Primary Cardiologist: None  Chief Complaint:   No chief complaint on file.   History of Present Illness:    Dustin Maynard 79 y.o. male ***   Dustin Maynard is a 79 y.o. 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   He presented with dysphagia but able to tolerate modified solid foods. Imaging reveals a large esophageal mass with regional lymphadenopathy and no distant metastases. Multidisciplinary management is planned, with initial concurrent chemoradiation.    Clinically appears to be T3, N1, M0 disease.   He had consultation with radiation oncology Dr. Norleen Maynard on 06/08/2024.   Presented to our clinic to establish care on 06/09/2024.  Smoking Hx: ***   Zubrod Score: At the time of surgery this patients most appropriate activity status/level should be described as: []     0    Normal activity, no symptoms []     1    Restricted in physical strenuous activity but ambulatory, able to do out light work []     2    Ambulatory and capable of self care, unable to do work activities, up and about               >50 % of waking hours                              []     3    Only limited self care, in bed greater than 50% of waking hours []     4    Completely disabled, no self care, confined to bed or chair []     5    Moribund   Past Medical History:  Diagnosis Date   Hyperlipidemia    Hypertension     Past Surgical History:  Procedure Laterality Date   UPPER GASTROINTESTINAL  ENDOSCOPY      Family History  Problem Relation Age of Onset   Heart disease Mother    Breast cancer Sister    Prostate cancer Brother    Colon cancer Neg Hx    Esophageal cancer Neg Hx    Rectal cancer Neg Hx    Stomach cancer Neg Hx      Tobacco Use History[1]  Social History   Substance and Sexual Activity  Alcohol Use No   Comment: very rarely     Allergies[2]  Current Outpatient Medications  Medication Sig Dispense Refill   amLODipine (NORVASC) 10 MG tablet      Ascorbic Acid (VITAMIN C) 1000 MG tablet Take 500 mg by mouth daily.     Flaxseed, Linseed, (FLAX SEEDS PO) Take by mouth.     GARLIC PO Take by mouth.     lisinopril (PRINIVIL,ZESTRIL) 10 MG tablet      Multiple Vitamins-Minerals (CENTRUM SILVER PO) Take by mouth.     niacin 500  MG tablet Take 500 mg by mouth at bedtime.     sildenafil (REVATIO) 20 MG tablet Take 20 mg by mouth as needed.     No current facility-administered medications for this visit.   Facility-Administered Medications Ordered in Other Visits  Medication Dose Route Frequency Provider Last Rate Last Admin   0.9 %  sodium chloride  infusion   Intravenous Continuous Maynard, Dustin K, PA-C        ROS   PHYSICAL EXAMINATION: There were no vitals taken for this visit. Physical Exam  Diagnostic Studies & Laboratory data:    CT Scan: *** PET/CT: *** EGD/EUS:  A large, circumferential, fungating and ulcerating mass was found in the lower third of the esophagus, 30 cm from the incisors. The mass was partially obstructing and would not allow passage of EGD scope ( diameter 9. 9 mm) . There was contact bleeding. Ultrathin EGD scope was not available in [facility]. Multiple biopsies were taken with a cold forceps for histology. Estimated blood loss was minimal.   Path:  1. Esophagus, biopsy, mass/polyp :       - SIGNET RING CELL CARCINOMA. SEE NOTE       I have independently reviewed the above radiology studies  and reviewed the findings  with the patient.   Recent Lab Findings: Lab Results  Component Value Date   WBC 8.1 06/03/2024   HGB 12.9 (L) 06/03/2024   HCT 39.5 06/03/2024   PLT 325.0 06/03/2024   GLUCOSE 98 06/03/2024   ALT 12 06/03/2024   AST 14 06/03/2024   NA 141 06/03/2024   Maynard 3.9 06/03/2024   CL 107 06/03/2024   CREATININE 0.96 06/03/2024   BUN 12 06/03/2024   CO2 22 06/03/2024       Problem List: ***  Assessment / Plan:   ***     I  spent {CHL ONC TIME VISIT - DTPQU:[id number]} with the patient face to face counseling and coordination of care.    Dustin Maynard 06/21/2024 8:32 AM           [1]  Social History Tobacco Use  Smoking Status Former   Current packs/day: 0.00   Types: Cigarettes   Quit date: 07/21/2010   Years since quitting: 13.9  Smokeless Tobacco Never  [2] Not on File  "

## 2024-06-21 NOTE — Sedation Documentation (Signed)
 RN Christoffer Currier pulled 2 mg Versed  and 100 mcg Fentanyl  in IR room. Pt. Received 2 mg Versed  and 100 mcg Fentanyl  throughout the procedure.

## 2024-06-23 ENCOUNTER — Inpatient Hospital Stay: Attending: Oncology

## 2024-06-23 ENCOUNTER — Encounter: Payer: Self-pay | Admitting: Oncology

## 2024-06-23 ENCOUNTER — Inpatient Hospital Stay (HOSPITAL_BASED_OUTPATIENT_CLINIC_OR_DEPARTMENT_OTHER): Admitting: Oncology

## 2024-06-23 VITALS — BP 131/80 | HR 90 | Temp 98.0°F | Resp 16 | Ht 70.0 in | Wt 176.0 lb

## 2024-06-23 DIAGNOSIS — C160 Malignant neoplasm of cardia: Secondary | ICD-10-CM | POA: Diagnosis present

## 2024-06-23 DIAGNOSIS — I10 Essential (primary) hypertension: Secondary | ICD-10-CM | POA: Diagnosis not present

## 2024-06-23 DIAGNOSIS — C801 Malignant (primary) neoplasm, unspecified: Secondary | ICD-10-CM | POA: Diagnosis not present

## 2024-06-23 DIAGNOSIS — L729 Follicular cyst of the skin and subcutaneous tissue, unspecified: Secondary | ICD-10-CM | POA: Insufficient documentation

## 2024-06-23 DIAGNOSIS — R918 Other nonspecific abnormal finding of lung field: Secondary | ICD-10-CM | POA: Insufficient documentation

## 2024-06-23 MED ORDER — LIDOCAINE-PRILOCAINE 2.5-2.5 % EX CREA: TOPICAL_CREAM | CUTANEOUS | 3 refills | Status: AC

## 2024-06-23 MED ORDER — DEXAMETHASONE 4 MG PO TABS: ORAL_TABLET | ORAL | 1 refills | Status: AC

## 2024-06-23 MED ORDER — SULFAMETHOXAZOLE-TRIMETHOPRIM 800-160 MG PO TABS: 1.0000 | ORAL_TABLET | Freq: Two times a day (BID) | ORAL | 0 refills | Status: DC

## 2024-06-23 MED ORDER — PROCHLORPERAZINE MALEATE 10 MG PO TABS: 10.0000 mg | ORAL_TABLET | Freq: Four times a day (QID) | ORAL | 1 refills | Status: AC | PRN

## 2024-06-23 MED ORDER — ONDANSETRON HCL 8 MG PO TABS: 8.0000 mg | ORAL_TABLET | Freq: Three times a day (TID) | ORAL | 1 refills | Status: AC | PRN

## 2024-06-23 NOTE — Assessment & Plan Note (Addendum)
 Please review oncology history for additional details and timeline of events.  He presented with dysphagia but able to tolerate modified solid foods. Imaging reveals a large esophageal mass with regional lymphadenopathy and no distant metastases. Multidisciplinary management is planned, with initial concurrent chemoradiation.   Clinically appears to be T3, N1, M0 disease.  He had consultation with radiation oncology Dr. Norleen Limes on 06/08/2024.  Presented to our clinic to establish care on 06/09/2024.  Discussed diagnosis, treatment options, NCCN guidelines.  Recommended evaluation by cardiothoracic surgery, Dr. Lightfoot, in preparation for treatment planning.  Patient prefers to avoid surgery at this time but will consider further after discussing with Dr. Shyrl.  Currently has an appointment with Dr. Shyrl on 06/25/2024.   If he does not want to proceed with surgery, we will plan for definitive chemoradiation with weekly carboplatin/paclitaxel.   Staging PET scan on 06/16/2024 showed hypermetabolic activity in the distal esophagus extending from carina to the GE junction. Concern for tumor extension into the herniated gastric cardia. No hypermetabolic mediastinal, gastrohepatic ligament, or paraesophageal lymph nodes. No evidence of FDG-avid distant metastasis. Small bilateral pulmonary nodules described on the comparison CT are below PET resolution and remain indeterminate. Favor benign.  Surgical resection (esophagectomy) may be considered based on response and candidacy, offering the best chance for long-term remission but associated with significant operative morbidity and recovery. Maintenance immunotherapy or targeted therapy may be considered depending on genetic testing and response.   Risks of chemoradiation include fatigue, cytopenias, nausea, neuropathy, and infusion reactions; surgical risks include major morbidity and prolonged recovery.  - Planned next generation  sequencing and genetic testing of tumor specimen to identify actionable mutations for future therapy. - Planned consideration of esophagectomy following chemoradiation if he is a suitable candidate and demonstrates favorable tumor response. - Planned consideration of maintenance immunotherapy or targeted therapy based on genetic testing and response. - Planned palliative care involvement for symptom management as needed.  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  It is also relatively less chemosensitive, compared to conventional adenocarcinoma.  His case will be discussed in multidisciplinary thoracic tumor board conference on 06/24/2024 for consensus opinion.  If proceeding with chemoradiation, we will arrange chemo education and I will plan to see him during the same week that he is scheduled to begin radiation treatments.  He has had Port-A-Cath placed already.

## 2024-06-23 NOTE — Progress Notes (Signed)
 "  [facility]  ONCOLOGY CLINIC PROGRESS NOTE   Patient Care Team: Tanda Bleacher, MD as PCP - General (Family Medicine) Ardis, Evalene CROME, RN as Oncology Nurse Navigator Charlanne Groom, MD as Consulting Physician (Gastroenterology) Dewey Rush, MD as Consulting Physician (Radiation Oncology) Autumn Millman, MD as Consulting Physician (Oncology)  PATIENT NAME: Dustin Maynard   MR#: [id number] DOB: 1945/12/04  Date of visit: 06/23/2024   ASSESSMENT & PLAN:   TEAGHAN FORMICA is a 79 y.o.  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  He presented with dysphagia but able to tolerate modified solid foods. Imaging reveals a large esophageal mass with regional lymphadenopathy and no distant metastases. Multidisciplinary management is planned, with initial concurrent chemoradiation.   Clinically appears to be T3, N1, M0 disease.  He had consultation with radiation oncology Dr. Rush Dewey on 06/08/2024.  Presented to our clinic to establish care on 06/09/2024.  Discussed diagnosis, treatment options, NCCN guidelines.  Recommended evaluation by cardiothoracic surgery, Dr. Lightfoot, in preparation for treatment planning.  Patient prefers to avoid surgery at this time but will consider further after discussing with Dr. Shyrl.  Currently has an appointment with Dr. Shyrl on 06/25/2024.   If he does not want to proceed with surgery, we will plan for definitive chemoradiation with weekly carboplatin/paclitaxel.   Staging PET scan on 06/16/2024 showed hypermetabolic activity in the distal esophagus extending from carina to the GE junction. Concern for tumor extension into the herniated gastric cardia. No hypermetabolic  mediastinal, gastrohepatic ligament, or paraesophageal lymph nodes. No evidence of FDG-avid distant metastasis. Small bilateral pulmonary nodules described on the comparison CT are below PET resolution and remain indeterminate. Favor benign.  Surgical resection (esophagectomy) may be considered based on response and candidacy, offering the best chance for long-term remission but associated with significant operative morbidity and recovery. Maintenance immunotherapy or targeted therapy may be considered depending on genetic testing and response.   Risks of chemoradiation include fatigue, cytopenias, nausea, neuropathy, and infusion reactions; surgical risks include major morbidity and prolonged recovery.  - Planned next generation sequencing and genetic testing of tumor specimen to identify actionable mutations for future therapy. - Planned consideration of esophagectomy following chemoradiation if he is a suitable candidate and demonstrates favorable tumor response. - Planned consideration of maintenance immunotherapy or targeted therapy based on genetic testing and response. - Planned palliative care involvement for symptom management as needed.  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  It is also relatively less chemosensitive, compared to conventional adenocarcinoma.  His case will be discussed in multidisciplinary thoracic tumor board conference on 06/24/2024 for consensus opinion.  If proceeding with chemoradiation, we will arrange chemo education and I will plan to see him during the same week that he is scheduled to begin radiation treatments.  He has had Port-A-Cath placed already.    Skin cyst with possible infection Chronic gluteal cyst with recent increase in size and intermittent discomfort, without drainage or overt infection. Given planned immunosuppressive therapy, empiric treatment is indicated to reduce risk of  complications during chemotherapy. - Performed physical exam of the cyst. - Prescribed trimethoprim -sulfamethoxazole  (Bactrim ) twice daily for seven days for empiric treatment. - Provided instructions for antibiotic use  and pharmacy coordination. - Discussed conditional plan for incision and drainage if no improvement with antibiotics. - Provided anticipatory guidance regarding monitoring for signs of worsening infection.  Pulmonary nodules, stable Multiple small pulmonary nodules, largest 5 mm, stable on serial imaging and not FDG-avid on PET scan. No evidence of lymph node involvement or metastatic disease. Low suspicion for malignancy; surveillance warranted. - Reviewed PET scan and CT confirming stability and lack of concerning features. - Planned ongoing surveillance of pulmonary nodules. - To be discussed at upcoming tumor board for multidisciplinary input.  Port-a-cath in situ for chemotherapy Port-a-cath recently placed without complications or infection. He is preparing for chemotherapy administration via the port. - Confirmed port placement and absence of infection. - Provided anticipatory guidance regarding port care and future blood draws through the port. - Prescribed numbing cream for port site, with instructions to wait at least two weeks post-placement before use. - Arranged chemotherapy education session to review port care and chemotherapy administration.  I reviewed lab results and outside records for this visit and discussed relevant results with the patient. Diagnosis, plan of care and treatment options were also discussed in detail with the patient. Opportunity provided to ask questions and answers provided to his apparent satisfaction. Provided instructions to call our clinic with any problems, questions or concerns prior to return visit. I recommended to continue follow-up with PCP and sub-specialists. He verbalized understanding and agreed with the plan.   NCCN  guidelines have been consulted in the planning of this patients care.  I spent a total of 40 minutes during this encounter with the patient including review of chart and various tests results, discussions about plan of care and coordination of care plan.   Chinita Patten, MD  06/23/2024 5:00 PM  [facility] - A DEPT OF [place] [address] 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  History of Present Illness TAYDON NASWORTHY is a 79 year old male with localized gastroesophageal junction cancer presenting for oncology follow-up prior to initiation of chemoradiation.  He experiences intermittent dysphagia, particularly with rapid ingestion, resulting in regurgitation of clear fluid and a sensation of food impaction at the proximal stomach. He manages symptoms by eating slowly, chewing thoroughly, and taking small sips of water. He denies odynophagia or pain with swallowing and maintains a good appetite.  He underwent port-a-cath placement on Monday without complications and reports no bleeding, pain, or signs of infection at the port site.  He has a longstanding gluteal cystic lesion that intermittently increases in size but has never produced discharge, purulence, or been associated with insect bites. He is concerned about potential infection in the context of upcoming chemotherapy and immunosuppression. He has not recently used antibiotics and has never taken Bactrim .  He has no respiratory symptoms including cough, dyspnea, or chest pain.  He is scheduled for a  telephone appointment with a dietitian to discuss dietary modifications to facilitate swallowing and is increasing his water intake to 64 ounces daily. He uses Hyzaar for hypertension and Trelegy as a precaution during flu season. He lives independently and  attended the visit alone. He underwent blood draw for mutation testing today from his arm and is aware future draws may be performed via his port.      I have reviewed the past medical history, past surgical history, social history and family history with the patient and they are unchanged from previous note.  HISTORY OF PRESENT ILLNESS:   ONCOLOGY HISTORY:   Patient started having intermittent dysphagia, especially to solids approximately in early 2025.  He also had episodic epigastric discomfort.  With these symptoms, he was referred to gastroenterology and was seen by Dr. Charlanne.   EGD on 06/03/2024 showed large, circumferential, fungating and ulcerating mass in the lower third of the esophagus, 30 cm from the incisors.  Mass was partially obstructing and would not allow passage of EGD scope.  There was contact bleeding.  Multiple biopsies were taken with a cold forceps.   Pathology from the esophageal mass came back positive for signet ring cell carcinoma.   He had staging CT chest, abdomen and pelvis with contrast on 06/07/2024 which showed circumferential thickening of the distal esophagus consistent with primary esophageal carcinoma.  Single paraesophageal lymph node adjacent to the small hiatal hernia.  Tiny indeterminate pulmonary nodules including a new right middle lobe lung nodule and a decreased size of left lower lobe nodule, largest measuring 4 mm.  No other evidence of metastatic disease in the abdomen or pelvis.   Clinically appears to be T3, N1, M0 disease.   He had consultation with radiation oncology Dr. Norleen Limes on 06/08/2024.   Presented to our clinic to establish care on 06/09/2024.   Discussed diagnosis, treatment  options, NCCN guidelines.  Recommended evaluation by cardiothoracic surgery, Dr. Lightfoot, in preparation for treatment planning.   Patient prefers to avoid surgery at this time but will consider further after discussing with Dr. Shyrl.  Currently has an appointment with Dr. Shyrl on 06/25/2024.   If he does not want to proceed with surgery, we will plan for definitive chemoradiation with weekly carboplatin/paclitaxel.   Staging PET scan on 06/16/2024 showed hypermetabolic activity in the distal esophagus extending from carina to the GE junction. Concern for tumor extension into the herniated gastric cardia. No hypermetabolic mediastinal, gastrohepatic ligament, or paraesophageal lymph nodes. No evidence of FDG-avid distant metastasis. Small bilateral pulmonary nodules described on the comparison CT are below PET resolution and remain indeterminate. Favor benign.   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  It is also relatively less chemosensitive, compared to conventional adenocarcinoma.  Oncology History  Signet ring cell carcinoma of esophagus  06/09/2024 Initial Diagnosis   Signet ring cell carcinoma of esophagus   06/09/2024 Cancer Staging   Staging form: Exocrine Pancreas, AJCC 8th Edition - Clinical: Stage IIB (cT3, cN1, cM0) - Signed by Autumn Millman, MD on 06/09/2024   07/07/2024 -  Chemotherapy   Patient is on Treatment Plan : ESOPHAGUS Carboplatin + Paclitaxel Weekly X 6 Weeks with XRT         REVIEW OF SYSTEMS:   Review of Systems - Oncology  All other pertinent systems were reviewed with the patient and are negative.  ALLERGIES: He has no allergies on file.  MEDICATIONS:  Current Outpatient Medications  Medication Sig Dispense Refill   amLODipine (NORVASC) 10 MG tablet      Ascorbic Acid (VITAMIN C) 1000 MG tablet Take 500 mg by mouth daily.     Flaxseed, Linseed, (FLAX SEEDS PO)  Take by  mouth.     GARLIC PO Take by mouth.     lisinopril (PRINIVIL,ZESTRIL) 10 MG tablet      Multiple Vitamins-Minerals (CENTRUM SILVER PO) Take by mouth.     niacin 500 MG tablet Take 500 mg by mouth at bedtime.     sildenafil (REVATIO) 20 MG tablet Take 20 mg by mouth as needed.     sulfamethoxazole -trimethoprim  (BACTRIM  DS) 800-160 MG tablet Take 1 tablet by mouth 2 (two) times daily. 14 tablet 0   dexamethasone  (DECADRON ) 4 MG tablet Take 2 tablets daily for 2 days, start the day after chemotherapy. Take with food. 30 tablet 1   lidocaine -prilocaine  (EMLA ) cream Apply to affected area once 30 g 3   ondansetron  (ZOFRAN ) 8 MG tablet Take 1 tablet (8 mg total) by mouth every 8 (eight) hours as needed for nausea or vomiting. Start on the third day after chemotherapy. 30 tablet 1   prochlorperazine  (COMPAZINE ) 10 MG tablet Take 1 tablet (10 mg total) by mouth every 6 (six) hours as needed for nausea or vomiting. 30 tablet 1   No current facility-administered medications for this visit.     VITALS:   Blood pressure 131/80, pulse 90, temperature 98 F (36.7 C), temperature source Temporal, resp. rate 16, height 5' 10 (1.778 m), weight 176 lb (79.8 kg), SpO2 100%.  Wt Readings from Last 3 Encounters:  06/23/24 176 lb (79.8 kg)  06/21/24 180 lb (81.6 kg)  06/09/24 180 lb 11.2 oz (82 kg)    Body mass index is 25.25 kg/m.    Onc Performance Status - 06/23/24 1446       ECOG Perf Status   ECOG Perf Status Fully active, able to carry on all pre-disease performance without restriction      KPS SCALE   KPS % SCORE Able to carry on normal activity, minor s/s of disease          PHYSICAL EXAM:   Physical Exam Constitutional:      General: He is not in acute distress.    Appearance: Normal appearance.  HENT:     Head: Normocephalic and atraumatic.  Eyes:     Conjunctiva/sclera: Conjunctivae normal.  Cardiovascular:     Rate and Rhythm: Normal rate and regular rhythm.  Pulmonary:      Effort: Pulmonary effort is normal. No respiratory distress.  Chest:     Comments: Right-sided Port-A-Cath in place without any signs of infection Abdominal:     General: There is no distension.  Skin:    Comments: Left-sided gluteal cyst versus subcutaneous abscess without obvious discharge  Neurological:     General: No focal deficit present.     Mental Status: He is alert and oriented to person, place, and time.  Psychiatric:        Mood and Affect: Mood normal.        Behavior: Behavior normal.      LABORATORY DATA:   I have reviewed the data as listed.  No results found for any visits on 06/23/24.    RADIOGRAPHIC STUDIES:  I have personally reviewed the radiological images as listed and agree with the findings in the report.  NM PET Image Initial (PI) Skull Base To Thigh EXAM: PET AND CT SKULL BASE TO MID THIGH 06/16/2024 11:27:29 AM  TECHNIQUE:  RADIOPHARMACEUTICAL: 10.14 mCi F-18 FDG. Given by Right AC IV at 10:03 am by SDH. Uptake time 60 minutes. Glucose level 93 mg/dl.  PET imaging was acquired  from the base of the skull to the mid thighs. Non-contrast enhanced computed tomography was obtained for attenuation correction and anatomic localization.  COMPARISON: CT 06/07/2024.  CLINICAL HISTORY: Esophageal cancer, staging. Malignant neoplasm of lower third of esophagus. NPO, No DM. 06/03/24 Biopsy of esophageal mass.  FINDINGS:  HEAD AND NECK: No metabolically active cervical lymphadenopathy.  CHEST: Hypermetabolic activity in the distal esophagus extending from carina to the GE junction. Activity is moderately intense with SUV max of 6.6. Activity extends into the gastric cardia with SUV max equal to 5.1. There is a small hiatal hernia. The small paraesophageal lymph node described on comparison CT measures 8 mm on image 79. There is no associated metabolic activity. No hypermetabolic mediastinal lymph nodes. The 2 small pulmonary nodules in  the right middle lobe and left lower lobe described on comparison CT do not have associated metabolic activity. These small nodules are 5 mm in size and not well assessed by PET imaging due to small size (image 65 and image 72 in the right middle lobe and left lower lobe respectively).  ABDOMEN AND PELVIS: No hypermetabolic gastrohepatic ligament lymph nodes. No liver metastasis. Physiologic activity within the gastrointestinal and genitourinary systems.  BONES AND SOFT TISSUE: No skeletal metastasis. No metabolically active aggressive osseous lesion.  IMPRESSION: 1. Hypermetabolic activity in the distal esophagus extending from carina to the GE junction. Concern for tumor extension into the herniated gastric cardia. . 2. No hypermetabolic mediastinal, gastrohepatic ligament, or paraesophageal lymph nodes. 3. No evidence of FDG-avid distant metastasis. 4. Small bilateral pulmonary nodules described on the comparison CT are below PET resolution and remain indeterminate. Favor benign. .  Electronically signed by: Norleen Boxer MD 06/21/2024 11:50 AM EST RP Workstation: [id number] IR IMAGING GUIDED PORT INSERTION CLINICAL DATA:  Esophageal carcinoma and need for porta cath for chemotherapy.  EXAM: IMPLANTED PORT A CATH PLACEMENT WITH ULTRASOUND AND FLUOROSCOPIC GUIDANCE  ANESTHESIA/SEDATION: Moderate (conscious) sedation was employed during this procedure. A total of Versed  2.0 mg and Fentanyl  100 mcg was administered intravenously.  Moderate Sedation Time: 43 minutes. [name] level of consciousness and vital signs were monitored continuously by radiology nursing throughout the procedure under my direct supervision.  FLUOROSCOPY: Radiation Exposure Index: 3.0 mGy Kerma  PROCEDURE: The procedure, risks, benefits, and alternatives were explained to the patient. Questions regarding the procedure were encouraged and answered. The patient understands and consents to the  procedure. A time-out was performed prior to initiating the procedure.  Ultrasound was utilized to confirm patency of the right internal jugular vein. An ultrasound image was saved and recorded. The right neck and chest were prepped with chlorhexidine in a sterile fashion, and a sterile drape was applied covering the operative field. Maximum barrier sterile technique with sterile gowns and gloves were used for the procedure. Local anesthesia was provided with 1% lidocaine .  After creating a small venotomy incision, a 21 gauge needle was advanced into the right internal jugular vein under direct, real-time ultrasound guidance. Ultrasound image documentation was performed. After securing guidewire access, an 8 Fr dilator was placed. A J-wire was kinked to measure appropriate catheter length.  A subcutaneous port pocket was then created along the upper chest wall utilizing sharp and blunt dissection. Portable cautery was utilized. The pocket was irrigated with sterile saline.  A single lumen power injectable port was chosen for placement. The 8 Fr catheter was tunneled from the port pocket site to the venotomy incision. The port was placed in the pocket. External catheter was  trimmed to appropriate length based on guidewire measurement.  At the venotomy, an 8 Fr peel-away sheath was placed over a guidewire. The catheter was then placed through the sheath and the sheath removed. Final catheter positioning was confirmed and documented with a fluoroscopic spot image. The port was accessed with a needle and aspirated and flushed with heparinized saline. The access needle was removed.  The venotomy and port pocket incisions were closed with subcutaneous 3-0 Monocryl and subcuticular 4-0 Vicryl. Dermabond was applied to both incisions.  COMPLICATIONS: COMPLICATIONS None  FINDINGS: After catheter placement, the tip lies at the cavo-atrial junction. The catheter aspirates normally and  is ready for immediate use.  IMPRESSION: Placement of single lumen port a cath via right internal jugular vein. The catheter tip lies at the cavo-atrial junction. A power injectable port a cath was placed and is ready for immediate use.  Electronically Signed   By: Marcey Moan M.D.   On: 06/21/2024 11:25   CODE STATUS:  Code Status History     Date Active Date Inactive Code Status Order ID Comments User Context   06/21/2024 1100 06/22/2024 0509 Full Code [id number]  Moan Marcey, MD [facility]    Questions for Most Recent Historical Code Status (Order [id number])     Question Answer   By: Procedural case: previous code status reviewed            Orders Placed This Encounter  Procedures   Consent Attestation for Oncology Treatment    The patient is informed of risks, benefits, side-effects of the prescribed oncology treatment. Potential short term and long term side effects and response rates discussed. After a long discussion, the patient made informed decision to proceed.:   Yes   ONCBCN PHYSICIAN COMMUNICATION 1    0 Number of doses of carboplatin received at [facility] or outside facility. AP signature of Provider. If patient has received greater than 5 doses of carboplatin, the following pre-medications should be ordered: dexamethasone , diphenhydramine, and formulary histamine H2 antagonist. If patient cannot tolerate oral histamine H2 antagonist, IV may be given.      Future Appointments  Date Time Provider Department Center  06/24/2024  3:15 PM Ivonne Harlene RAMAN, RD [facility] None  06/25/2024 10:40 AM Lightfoot, Linnie KIDD, MD [facility]     This document was completed utilizing speech recognition software. Grammatical errors, random word insertions, pronoun errors, and incomplete sentences are an occasional consequence of this system due to software limitations, ambient noise, and hardware issues. Any formal questions or concerns about the content, text or information  contained within the body of this dictation should be directly addressed to the provider for clarification.   "

## 2024-06-24 ENCOUNTER — Telehealth: Payer: Self-pay | Admitting: Oncology

## 2024-06-24 ENCOUNTER — Other Ambulatory Visit: Payer: Self-pay

## 2024-06-24 ENCOUNTER — Inpatient Hospital Stay: Admitting: Dietician

## 2024-06-24 ENCOUNTER — Telehealth: Payer: Self-pay | Admitting: Dietician

## 2024-06-24 NOTE — Telephone Encounter (Signed)
 I spoke with patient and he is scheduled for 06/30/2024 chemo education class per 06/23/2024 los.

## 2024-06-24 NOTE — Telephone Encounter (Signed)
 Nutrition Assessment  Reason for Assessment: Referral    ASSESSMENT: 79 year old male with newly diagnosed signet ring cell carcinoma of esophagus. Treatment plan under work-up pending surgery consult (planned 1/9). If patient does not proceed with surgery, pt will receive definitive chemoradiation with weekly carboplatin/paclitaxel. Patient is under the care of Dr. Autumn  Past medical history includes HTN, inguinal hernia, HLD, hypercholesterolemia   Spoke with patient via telephone. Says he was having spells of dysphagia for months prior to diagnosis. Thought this might be spasms as it would get better for a few months and then come back. He endorses good appetite, approximately 90% from baseline. Dysphagia with solids affect interest in eating at times. Recalls lots of soups, oatmeal, soaked cereal, chocolate, ice cream, premier shakes, small bites of hush puppies, fried eggs cut up in small pieces, bacon, cheese sticks. Patient is working to increase intake of water. Currently drinking 32 ounces. He has one cup of coffee in the morning. Also drinks green tea, orange juice, and milk.    Nutrition Focused Physical Exam: unable to complete (telephone visit)   Medications: amlodipine, lisinopril, MVI, zofran , compazine , revatio, bactrim    Labs: 12/18 outside labs reviewed    Anthropometrics:   Height: 5'10 Weight: 176 lb (1/7) UBW: 180-185 lb  BMI: 25.25   Estimated Energy Needs  Kcals: 2000-2350 Protein: 96-112 Fluid: >/= 2L   NUTRITION DIAGNOSIS: Swallowing difficulties related to esophageal cancer as evidenced by dysphagia with solids per pt report   INTERVENTION:  Continue small frequent meals and choosing high calorie high protein foods Continue premier shakes, suggested adding to coffee in place of creamer or to ice cream for high calorie shake Offered ideas on ways to increase hydration and discussed fluids that count towards goal  Contact information provided    MONITORING, EVALUATION, GOAL: Pt will tolerate adequate calories and protein to minimize wt loss    Next Visit: To be determined with treatment plan

## 2024-06-25 ENCOUNTER — Ambulatory Visit: Admitting: Thoracic Surgery (Cardiothoracic Vascular Surgery)

## 2024-06-26 ENCOUNTER — Other Ambulatory Visit: Payer: Self-pay

## 2024-06-28 ENCOUNTER — Ambulatory Visit

## 2024-06-28 DIAGNOSIS — I442 Atrioventricular block, complete: Secondary | ICD-10-CM | POA: Diagnosis not present

## 2024-06-29 LAB — CUP PACEART REMOTE DEVICE CHECK
Battery Remaining Longevity: 74 mo
Battery Remaining Percentage: 93 %
Battery Voltage: 2.96 V
Brady Statistic AP VP Percent: 98 %
Brady Statistic AP VS Percent: 1 %
Brady Statistic AS VP Percent: 1.3 %
Brady Statistic AS VS Percent: 1 %
Brady Statistic RA Percent Paced: 98 %
Date Time Interrogation Session: 20260112022722
Implantable Lead Connection Status: 753985
Implantable Lead Connection Status: 753985
Implantable Lead Connection Status: 753985
Implantable Lead Implant Date: 20240711
Implantable Lead Implant Date: 20240711
Implantable Lead Implant Date: 20250529
Implantable Lead Location: 753858
Implantable Lead Location: 753859
Implantable Lead Location: 753860
Implantable Pulse Generator Implant Date: 20250529
Lead Channel Impedance Value: 1025 Ohm
Lead Channel Impedance Value: 450 Ohm
Lead Channel Impedance Value: 460 Ohm
Lead Channel Pacing Threshold Amplitude: 0.75 V
Lead Channel Pacing Threshold Amplitude: 0.75 V
Lead Channel Pacing Threshold Amplitude: 1.25 V
Lead Channel Pacing Threshold Pulse Width: 0.5 ms
Lead Channel Pacing Threshold Pulse Width: 0.5 ms
Lead Channel Pacing Threshold Pulse Width: 0.9 ms
Lead Channel Sensing Intrinsic Amplitude: 5 mV
Lead Channel Sensing Intrinsic Amplitude: 5 mV
Lead Channel Setting Pacing Amplitude: 2 V
Lead Channel Setting Pacing Amplitude: 2.25 V
Lead Channel Setting Pacing Amplitude: 2.5 V
Lead Channel Setting Pacing Pulse Width: 0.5 ms
Lead Channel Setting Pacing Pulse Width: 0.9 ms
Lead Channel Setting Sensing Sensitivity: 2 mV
Pulse Gen Model: 3562
Pulse Gen Serial Number: 8279232

## 2024-06-30 ENCOUNTER — Inpatient Hospital Stay

## 2024-06-30 DIAGNOSIS — C801 Malignant (primary) neoplasm, unspecified: Secondary | ICD-10-CM

## 2024-07-01 ENCOUNTER — Emergency Department (HOSPITAL_COMMUNITY)
Admission: EM | Admit: 2024-07-01 | Discharge: 2024-07-01 | Disposition: A | Discharge status: Home / Self Care | Attending: Emergency Medicine | Admitting: Emergency Medicine

## 2024-07-01 ENCOUNTER — Other Ambulatory Visit: Payer: Self-pay

## 2024-07-01 ENCOUNTER — Inpatient Hospital Stay

## 2024-07-01 ENCOUNTER — Encounter: Payer: Self-pay | Admitting: Oncology

## 2024-07-01 ENCOUNTER — Inpatient Hospital Stay: Admitting: Oncology

## 2024-07-01 DIAGNOSIS — Z79899 Other long term (current) drug therapy: Secondary | ICD-10-CM | POA: Insufficient documentation

## 2024-07-01 DIAGNOSIS — L0231 Cutaneous abscess of buttock: Secondary | ICD-10-CM | POA: Insufficient documentation

## 2024-07-01 DIAGNOSIS — I10 Essential (primary) hypertension: Secondary | ICD-10-CM | POA: Diagnosis not present

## 2024-07-01 MED ORDER — LIDOCAINE-EPINEPHRINE (PF) 2 %-1:200000 IJ SOLN
10.0000 mL | Freq: Once | INTRAMUSCULAR | Status: AC
  Administered 2024-07-01: 10 mL via INTRADERMAL
  Filled 2024-07-01: qty 20

## 2024-07-01 MED ORDER — SULFAMETHOXAZOLE-TRIMETHOPRIM 800-160 MG PO TABS: 1.0000 | ORAL_TABLET | Freq: Two times a day (BID) | ORAL | 0 refills | Status: AC

## 2024-07-01 NOTE — Discharge Instructions (Addendum)
 Please apply warm moist compress to the affected area several times daily to aid with healing.  Take antibiotic as prescribed.  You may take over-the-counter Tylenol or ibuprofen as needed for pain.  Return if you have any concern.

## 2024-07-01 NOTE — ED Provider Notes (Signed)
 " [place] EMERGENCY DEPARTMENT AT [facility] Provider Note   CSN: [id number] Arrival date & time: 07/01/24  9240     Patient presents with: Abscess   Dustin Maynard is a 79 y.o. male.   The history is provided by the patient and medical records. No language interpreter was used.  Abscess Associated symptoms: no fever      79 year old male with history of esophageal cancer, will start chemotherapy next week, hypertension, hyperlipidemia, presenting with concerns of skin infection.  Patient states he noticed a bump to his left buttock that appears about a week ago.  He mentioned this to his provider who evaluated and gave him an Bactrim  for which he has been taking for the full duration.  Despite taking antibiotic he noticed it is becoming increasingly more painful and more swollen.  For the past 2 days it had came to ahead and ruptured oozing out fluid but he still endorsed discomfort to the affected area.  He does not endorse any fever denies any trauma no fever or chills and he is up-to-date with tetanus.  Prior to Admission medications  Medication Sig Start Date End Date Taking? Authorizing Provider  amLODipine (NORVASC) 10 MG tablet  07/04/13   [provider]  Ascorbic Acid (VITAMIN C) 1000 MG tablet Take 500 mg by mouth daily.    [provider]  dexamethasone  (DECADRON ) 4 MG tablet Take 2 tablets daily for 2 days, start the day after chemotherapy. Take with food. 06/23/24   Pasam, Avinash, MD  Flaxseed, Linseed, (FLAX SEEDS PO) Take by mouth.    [provider]  GARLIC PO Take by mouth.    [provider]  lidocaine -prilocaine  (EMLA ) cream Apply to affected area once 06/23/24   Pasam, Avinash, MD  lisinopril (PRINIVIL,ZESTRIL) 10 MG tablet  07/04/13   [provider]  Multiple Vitamins-Minerals (CENTRUM SILVER PO) Take by mouth.    [provider]  niacin 500 MG tablet Take 500 mg by mouth at bedtime.    [provider]  ondansetron  (ZOFRAN ) 8 MG tablet Take 1 tablet (8 mg total) by mouth every 8 (eight) hours as needed for nausea or vomiting. Start on the third day after chemotherapy. 06/23/24   Pasam, Chinita, MD  prochlorperazine  (COMPAZINE ) 10 MG tablet Take 1 tablet (10 mg total) by mouth every 6 (six) hours as needed for nausea or vomiting. 06/23/24   Pasam, Avinash, MD  sildenafil (REVATIO) 20 MG tablet Take 20 mg by mouth as needed. 04/13/14   [provider]  sulfamethoxazole -trimethoprim  (BACTRIM  DS) 800-160 MG tablet Take 1 tablet by mouth 2 (two) times daily. 06/23/24   Pasam, Chinita, MD    Allergies: Patient has no known allergies.    Review of Systems  Constitutional:  Negative for fever.    Updated Vital Signs BP 134/76 (BP Location: Right Arm)   Pulse 87   Temp 97.6 F (36.4 C) (Oral)   Resp 16   SpO2 100%   Physical Exam Constitutional:      General: He is not in acute distress.    Appearance: He is well-developed.  HENT:     Head: Atraumatic.  Eyes:     Conjunctiva/sclera: Conjunctivae normal.  Abdominal:     Palpations: Abdomen is soft.     Tenderness: There is no abdominal tenderness.  Genitourinary:    Comments: Chaperone present during exam.  An area of induration and fluctuant with surrounding erythema and tenderness noted just above  the left buttock.  Area is measuring approximately 4 cm in diameter. Musculoskeletal:     Cervical back: Normal range of motion and neck supple.  Skin:    Findings: No rash.  Neurological:     Mental Status: He is alert.     (all labs ordered are listed, but only abnormal results are displayed) Labs Reviewed - No data to display  EKG: None  Radiology: No results found.   .Incision and Drainage  Date/Time: 07/01/2024 9:06 AM  Performed by: Nivia Colon, PA-C Authorized by: Nivia Colon, PA-C   Consent:    Consent obtained:  Verbal   Consent given by:  Patient   Risks discussed:  Bleeding, incomplete  drainage, pain and damage to other organs   Alternatives discussed:  No treatment Universal protocol:    Procedure explained and questions answered to patient or proxy's satisfaction: yes     Relevant documents present and verified: yes     Patient identity confirmed:  Verbally with patient Location:    Type:  Abscess   Size:  4   Location:  Anogenital   Anogenital location: left buttock. Pre-procedure details:    Skin preparation:  Betadine Anesthesia:    Anesthesia method:  Local infiltration   Local anesthetic:  10 mL lidocaine  (XYLOCAINE ) 2 %-EPINEPHrine  1:200,000 (PF) injection Procedure type:    Complexity:  Simple Procedure details:    Incision types:  Single straight   Incision depth:  Subcutaneous   Wound management:  Probed and deloculated, irrigated with saline and extensive cleaning   Drainage:  Purulent   Drainage amount:  Scant   Packing materials:  None Post-procedure details:    Procedure completion:  Tolerated well, no immediate complications    Medications Ordered in the ED  lidocaine -EPINEPHrine  (XYLOCAINE  W/EPI) 2 %-1:200000 (PF) injection 10 mL (has no administration in time range)                                    Medical Decision Making  BP 134/76 (BP Location: Right Arm)   Pulse 87   Temp 97.6 F (36.4 C) (Oral)   Resp 16   SpO2 100%   13:14 AM  79 year old male with history of esophageal cancer, will start chemotherapy next week, hypertension, hyperlipidemia, presenting with concerns of skin infection.  Patient states he noticed a bump to his left buttock that appears about a week ago.  He mentioned this to his provider who evaluated and gave him an Bactrim  for which he has been taking for the full duration.  Despite taking antibiotic he noticed it is becoming increasingly more painful and more swollen.  For the past 2 days it had came to ahead and ruptured oozing out fluid but he still endorsed discomfort to the affected area.  He does not  endorse any fever denies any trauma no fever or chills and he is up-to-date with tetanus.  On exam this is a well-appearing male resting comfortably in no acute discomfort.  He does have evidence of an abscess to his left buttock that has previously ruptured.  Abscess was incised and drained.  Packing not needed.  Will review antibiotic and provide appropriate wound care instruction including regular use of warm moist compresses.  Return precaution given.  Patient stable for discharge.  Symptoms improved after I&D.  Hospital admission considered but patient is overall well-appearing and stable for discharge.  Vital signs normal.  No signs of systemic infection.  Does not think advanced imaging is indicated at this time.     Final diagnoses:  Abscess of left buttock    ED Discharge Orders          Ordered    sulfamethoxazole -trimethoprim  (BACTRIM  DS) 800-160 MG tablet  2 times daily        07/01/24 0910               Nivia Colon, PA-C 07/01/24 0911    Griselda Norris, MD 07/07/24 [phone]  "

## 2024-07-01 NOTE — ED Triage Notes (Signed)
 Pt a CA pt, is supposed to start chemo next week, pt saw PCP and informed him of cyst/ abscess on lower back/upper buttock, pt was given abx and completed them, pt reports site did drain last night but it is still making him uncomfortable.

## 2024-07-02 ENCOUNTER — Ambulatory Visit: Payer: Self-pay | Admitting: Cardiovascular Disease

## 2024-07-02 ENCOUNTER — Telehealth: Payer: Self-pay

## 2024-07-02 NOTE — Telephone Encounter (Signed)
 [facility] Psychosocial Distress Screening Clinical Social Work  Dustin Maynard is a 79 y.o. year old male. Clinical Social Work was referred by distress screen protocol for positive distress screening. The patient scored a 5 on the Psychosocial Distress Thermometer which indicates mild distress. Clinical Social Worker contacted patient by phone to assess for distress and other psychosocial needs.     Distress Screen:    06/30/2024   11:05 AM  ONCBCN DISTRESS SCREENING  Screening Type Initial Screening  How much distress have you been experiencing in the past week? (0-10) 5  Practical concerns type Treatment decisions  Social concerns type Communication with health care team  Physical Concerns Type  Changes in eating     Interventions:  CSW and patient discussed common feeling and emotions when being diagnosed with cancer, and the importance of support during treatment.  CSW informed patient of the support team and support services at [facility].  CSW provided contact information and encouraged patient to call with any questions or concerns.   Follow Up Plan: Patient will contact CSW with any support or resource needs Patient verbalizes understanding of plan: Yes    Lizbeth Sprague, LCSW

## 2024-07-02 NOTE — Progress Notes (Signed)
 Remote PPM Transmission

## 2024-07-06 ENCOUNTER — Other Ambulatory Visit: Payer: Self-pay | Admitting: Cardiology

## 2024-07-06 ENCOUNTER — Encounter: Payer: Self-pay | Admitting: Oncology

## 2024-07-07 LAB — GUARDANT 360

## 2024-07-08 NOTE — Progress Notes (Unsigned)
 "     [address] [place] [address]             [phone]                   Dustin Maynard [facility] Medical Record #[id number] Date of Birth: [birth year]  Referring: Autumn Millman, MD Primary Care: Tanda Bleacher, MD Primary Cardiologist: None  Chief Complaint:   No chief complaint on file.   History of Present Illness:    Dustin Maynard 79 y.o. male ***    Smoking Hx: ***   Zubrod Score: At the time of surgery this patients most appropriate activity status/level should be described as: []     0    Normal activity, no symptoms []     1    Restricted in physical strenuous activity but ambulatory, able to do out light work []     2    Ambulatory and capable of self care, unable to do work activities, up and about               >50 % of waking hours                              []     3    Only limited self care, in bed greater than 50% of waking hours []     4    Completely disabled, no self care, confined to bed or chair []     5    Moribund   Past Medical History:  Diagnosis Date   Hyperlipidemia    Hypertension     Past Surgical History:  Procedure Laterality Date   IR IMAGING GUIDED PORT INSERTION  06/21/2024   UPPER GASTROINTESTINAL ENDOSCOPY      Family History  Problem Relation Age of Onset   Heart disease Mother    Breast cancer Sister    Prostate cancer Brother    Colon cancer Neg Hx    Esophageal cancer Neg Hx    Rectal cancer Neg Hx    Stomach cancer Neg Hx      Tobacco Use History[1]  Social History   Substance and Sexual Activity  Alcohol Use No   Comment: very rarely     Allergies[2]  Current Outpatient Medications  Medication Sig Dispense Refill   amLODipine (NORVASC) 10 MG tablet      Ascorbic Acid (VITAMIN C) 1000 MG tablet Take 500 mg by mouth daily.     dexamethasone  (DECADRON ) 4 MG tablet Take 2 tablets daily for 2 days, start the day after chemotherapy. Take with food. 30 tablet 1   Flaxseed,  Linseed, (FLAX SEEDS PO) Take by mouth.     GARLIC PO Take by mouth.     lidocaine -prilocaine  (EMLA ) cream Apply to affected area once 30 g 3   lisinopril (PRINIVIL,ZESTRIL) 10 MG tablet      Multiple Vitamins-Minerals (CENTRUM SILVER PO) Take by mouth.     niacin 500 MG tablet Take 500 mg by mouth at bedtime.     ondansetron  (ZOFRAN ) 8 MG tablet Take 1 tablet (8 mg total) by mouth every 8 (eight) hours as needed for nausea or vomiting. Start on the third day after chemotherapy. 30 tablet 1   prochlorperazine  (COMPAZINE ) 10 MG tablet Take 1 tablet (10 mg total) by mouth every 6 (six) hours as needed for nausea or vomiting. 30 tablet 1  sildenafil (REVATIO) 20 MG tablet Take 20 mg by mouth as needed.     sulfamethoxazole -trimethoprim  (BACTRIM  DS) 800-160 MG tablet Take 1 tablet by mouth 2 (two) times daily. 14 tablet 0   No current facility-administered medications for this visit.    ROS   PHYSICAL EXAMINATION: There were no vitals taken for this visit. Physical Exam  Diagnostic Studies & Laboratory data:     PET/CT: IMPRESSION: 1. Hypermetabolic activity in the distal esophagus extending from carina to the GE junction. Concern for tumor extension into the herniated gastric cardia. . 2. No hypermetabolic mediastinal, gastrohepatic ligament, or paraesophageal lymph nodes. 3. No evidence of FDG-avid distant metastasis. 4. Small bilateral pulmonary nodules described on the comparison CT are below PET resolution and remain indeterminate. Favor benign. .  EGD/EUS: December 2025 - A large, circumferential, fungating and ulcerating mass was found in the lower third of the esophagus, 30 cm from the incisors. The mass was partially obstructing and would not allow passage of EGD scope ( diameter 9. 9 mm) . There was contact bleeding. Ultrathin EGD scope was not available in [facility]. Multiple biopsies were taken with a cold forceps for histology. Estimated blood loss was minimal.   Path:  1.  Esophagus, biopsy, mass/polyp :       - SIGNET RING CELL CARCINOMA. SEE NOTE       I have independently reviewed the above radiology studies  and reviewed the findings with the patient.   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  Seiwert I/II.  ***     I  spent {CHL ONC TIME VISIT - [id number] with the patient face to face counseling and coordination of care.    Dustin Maynard 07/08/2024 12:39 PM            [1]  Social History Tobacco Use  Smoking Status Former   Current packs/day: 0.00   Types: Cigarettes   Quit date: 07/21/2010   Years since quitting: 13.9  Smokeless Tobacco Never  [2] No Known Allergies  "

## 2024-07-09 ENCOUNTER — Other Ambulatory Visit: Payer: Self-pay

## 2024-07-09 ENCOUNTER — Other Ambulatory Visit: Payer: Self-pay | Admitting: Oncology

## 2024-07-09 ENCOUNTER — Encounter: Payer: Self-pay | Admitting: Thoracic Surgery (Cardiothoracic Vascular Surgery)

## 2024-07-09 ENCOUNTER — Ambulatory Visit
Attending: Thoracic Surgery (Cardiothoracic Vascular Surgery) | Admitting: Thoracic Surgery (Cardiothoracic Vascular Surgery)

## 2024-07-09 VITALS — BP 116/74 | HR 82 | Resp 20 | Ht 70.0 in | Wt 174.7 lb

## 2024-07-09 DIAGNOSIS — C801 Malignant (primary) neoplasm, unspecified: Secondary | ICD-10-CM | POA: Insufficient documentation

## 2024-07-10 ENCOUNTER — Other Ambulatory Visit: Payer: Self-pay

## 2024-07-10 ENCOUNTER — Other Ambulatory Visit: Payer: Self-pay | Admitting: Oncology

## 2024-07-12 ENCOUNTER — Other Ambulatory Visit: Payer: Self-pay

## 2024-07-15 ENCOUNTER — Ambulatory Visit
Admission: RE | Admit: 2024-07-15 | Discharge: 2024-07-15 | Disposition: A | Discharge status: Home / Self Care | Source: Ambulatory Visit | Attending: Radiation Oncology | Admitting: Radiation Oncology

## 2024-07-15 ENCOUNTER — Other Ambulatory Visit: Payer: Self-pay

## 2024-07-15 DIAGNOSIS — C155 Malignant neoplasm of lower third of esophagus: Secondary | ICD-10-CM | POA: Insufficient documentation

## 2024-07-15 DIAGNOSIS — Z87891 Personal history of nicotine dependence: Secondary | ICD-10-CM | POA: Insufficient documentation

## 2024-07-15 NOTE — Progress Notes (Signed)
 Pharmacist Chemotherapy Monitoring - Initial Assessment    Anticipated start date: 07/22/2024  The following has been reviewed per standard work regarding the patient's treatment regimen: [name] diagnosis, treatment plan and drug doses, and organ/hematologic function Lab orders and baseline tests specific to treatment regimen  The treatment plan start date, drug sequencing, and pre-medications Prior authorization status  Patient's documented medication list, including drug-drug interaction screen and prescriptions for anti-emetics and supportive care specific to the treatment regimen The drug concentrations, fluid compatibility, administration routes, and timing of the medications to be used [name] access for treatment and lifetime cumulative dose history, if applicable  [name] medication allergies and previous infusion related reactions, if applicable   Changes made to treatment plan:  N/A  Follow up needed:  N/A   Dustin Maynard, RPH, 07/15/2024  3:35 PM

## 2024-07-18 ENCOUNTER — Other Ambulatory Visit: Payer: Self-pay

## 2024-07-19 ENCOUNTER — Ambulatory Visit: Admitting: Radiation Oncology

## 2024-07-20 ENCOUNTER — Other Ambulatory Visit: Payer: Self-pay

## 2024-07-20 ENCOUNTER — Ambulatory Visit: Admission: RE | Admit: 2024-07-20 | Discharge: 2024-07-20 | Attending: Radiation Oncology

## 2024-07-20 LAB — RAD ONC ARIA SESSION SUMMARY
Course Elapsed Days: 0
Plan Fractions Treated to Date: 1
Plan Prescribed Dose Per Fraction: 1.8 Gy
Plan Total Fractions Prescribed: 25
Plan Total Prescribed Dose: 45 Gy
Reference Point Dosage Given to Date: 1.8 Gy
Reference Point Session Dosage Given: 1.8 Gy
Session Number: 1

## 2024-07-21 ENCOUNTER — Ambulatory Visit
Admission: RE | Admit: 2024-07-21 | Discharge: 2024-07-21 | Disposition: A | Source: Ambulatory Visit | Attending: Radiation Oncology

## 2024-07-21 ENCOUNTER — Other Ambulatory Visit: Payer: Self-pay

## 2024-07-21 LAB — RAD ONC ARIA SESSION SUMMARY
Course Elapsed Days: 1
Plan Fractions Treated to Date: 2
Plan Prescribed Dose Per Fraction: 1.8 Gy
Plan Total Fractions Prescribed: 25
Plan Total Prescribed Dose: 45 Gy
Reference Point Dosage Given to Date: 3.6 Gy
Reference Point Session Dosage Given: 1.8 Gy
Session Number: 2

## 2024-07-22 ENCOUNTER — Other Ambulatory Visit: Payer: Self-pay

## 2024-07-22 ENCOUNTER — Inpatient Hospital Stay

## 2024-07-22 ENCOUNTER — Ambulatory Visit
Admission: RE | Admit: 2024-07-22 | Discharge: 2024-07-22 | Disposition: A | Source: Ambulatory Visit | Attending: Radiation Oncology

## 2024-07-22 ENCOUNTER — Encounter: Payer: Self-pay | Admitting: Oncology

## 2024-07-22 ENCOUNTER — Inpatient Hospital Stay: Admitting: Oncology

## 2024-07-22 ENCOUNTER — Inpatient Hospital Stay: Attending: Oncology | Admitting: Dietician

## 2024-07-22 VITALS — BP 118/68 | HR 84 | Resp 16

## 2024-07-22 VITALS — BP 125/75 | HR 84 | Temp 97.9°F | Resp 17 | Ht 70.0 in | Wt 175.2 lb

## 2024-07-22 DIAGNOSIS — C801 Malignant (primary) neoplasm, unspecified: Secondary | ICD-10-CM

## 2024-07-22 LAB — RAD ONC ARIA SESSION SUMMARY
Course Elapsed Days: 2
Plan Fractions Treated to Date: 3
Plan Prescribed Dose Per Fraction: 1.8 Gy
Plan Total Fractions Prescribed: 25
Plan Total Prescribed Dose: 45 Gy
Reference Point Dosage Given to Date: 5.4 Gy
Reference Point Session Dosage Given: 1.8 Gy
Session Number: 3

## 2024-07-22 LAB — CMP (CANCER CENTER ONLY)
ALT: 15 U/L (ref 0–44)
AST: 17 U/L (ref 15–41)
Albumin: 4.2 g/dL (ref 3.5–5.0)
Alkaline Phosphatase: 70 U/L (ref 38–126)
Anion gap: 10 (ref 5–15)
BUN: 12 mg/dL (ref 8–23)
CO2: 24 mmol/L (ref 22–32)
Calcium: 9 mg/dL (ref 8.9–10.3)
Chloride: 105 mmol/L (ref 98–111)
Creatinine: 0.88 mg/dL (ref 0.61–1.24)
GFR, Estimated: >60 mL/min
Glucose, Bld: 119 mg/dL — ABNORMAL HIGH (ref 70–99)
Potassium: 4.1 mmol/L (ref 3.5–5.1)
Sodium: 139 mmol/L (ref 135–145)
Total Bilirubin: 0.4 mg/dL (ref 0.0–1.2)
Total Protein: 7 g/dL (ref 6.5–8.1)

## 2024-07-22 LAB — CBC WITH DIFFERENTIAL (CANCER CENTER ONLY)
Abs Immature Granulocytes: 0.02 10*3/uL (ref 0.00–0.07)
Basophils Absolute: 0 10*3/uL (ref 0.0–0.1)
Basophils Relative: 0 %
Eosinophils Absolute: 0.1 10*3/uL (ref 0.0–0.5)
Eosinophils Relative: 2 %
HCT: 37.1 % — ABNORMAL LOW (ref 39.0–52.0)
Hemoglobin: 12.4 g/dL — ABNORMAL LOW (ref 13.0–17.0)
Immature Granulocytes: 0 %
Lymphocytes Relative: 19 %
Lymphs Abs: 1.4 10*3/uL (ref 0.7–4.0)
MCH: 27.1 pg (ref 26.0–34.0)
MCHC: 33.4 g/dL (ref 30.0–36.0)
MCV: 81 fL (ref 80.0–100.0)
Monocytes Absolute: 0.7 10*3/uL (ref 0.1–1.0)
Monocytes Relative: 10 %
Neutro Abs: 5.1 10*3/uL (ref 1.7–7.7)
Neutrophils Relative %: 69 %
Platelet Count: 264 10*3/uL (ref 150–400)
RBC: 4.58 MIL/uL (ref 4.22–5.81)
RDW: 15 % (ref 11.5–15.5)
WBC Count: 7.5 10*3/uL (ref 4.0–10.5)
nRBC: 0 % (ref 0.0–0.2)

## 2024-07-22 MED ORDER — SODIUM CHLORIDE 0.9 % IV SOLN: INTRAVENOUS | Status: DC

## 2024-07-22 MED ORDER — PALONOSETRON HCL INJECTION 0.25 MG/5ML
0.2500 mg | Freq: Once | INTRAVENOUS | Status: AC
  Administered 2024-07-22: 0.25 mg via INTRAVENOUS
  Filled 2024-07-22: qty 5

## 2024-07-22 MED ORDER — SODIUM CHLORIDE 0.9 % IV SOLN
50.0000 mg/m2 | Freq: Once | INTRAVENOUS | Status: AC
  Administered 2024-07-22: 102 mg via INTRAVENOUS
  Filled 2024-07-22: qty 17

## 2024-07-22 MED ORDER — DIPHENHYDRAMINE HCL 50 MG/ML IJ SOLN
50.0000 mg | Freq: Once | INTRAMUSCULAR | Status: AC
  Administered 2024-07-22: 50 mg via INTRAVENOUS
  Filled 2024-07-22: qty 1

## 2024-07-22 MED ORDER — DEXAMETHASONE SOD PHOSPHATE PF 10 MG/ML IJ SOLN
10.0000 mg | Freq: Once | INTRAMUSCULAR | Status: AC
  Administered 2024-07-22: 10 mg via INTRAVENOUS
  Filled 2024-07-22: qty 1

## 2024-07-22 MED ORDER — FAMOTIDINE IN NACL 20-0.9 MG/50ML-% IV SOLN
20.0000 mg | Freq: Once | INTRAVENOUS | Status: AC
  Administered 2024-07-22: 20 mg via INTRAVENOUS
  Filled 2024-07-22: qty 50

## 2024-07-22 MED ORDER — SODIUM CHLORIDE 0.9 % IV SOLN
191.8000 mg | Freq: Once | INTRAVENOUS | Status: AC
  Administered 2024-07-22: 190 mg via INTRAVENOUS
  Filled 2024-07-22: qty 19

## 2024-07-22 NOTE — Progress Notes (Signed)
 Nutrition Follow-up:  Pt with signet ring cell carcinoma of esophagus. He is receiving chemoradiation with weekly carboplatin /paclitaxel  (first chemo 2/5). Patient is under the care of Dr. Autumn   Met with patient in infusion. Daughter is present at visit today. Patient reports doing good today. Has cotton mouth and requesting water which RD provided. Patient reports good appetite. Ongoing dysphagia with most solids. He is able to swallow medications without difficulty. Planning to start MVI as diet mostly consist of carbs and ice cream per daughter. Yesterday had oatmeal for breakfast, snacked on mixed nuts, at pint of ice cream for lunch, snacked on cheese puffs and 3 baby bell cheese. Daughter has stocked patient up with different types of protein shakes.    Medications: reviewed   Labs: reviewed   Anthropometrics: Wt 175 lb 3.2 oz today   1/7 - 176 lb    Estimated Energy Needs  Kcals: 2000-2350 Protein: 96-112 Fluid: >/= 2L  NUTRITION DIAGNOSIS: Swallowing difficulties continue    INTERVENTION:  Continue small frequent meals - recommend protein at every meal - soft moist high protein foods list Encourage high calorie high protein foods for wt maintenance - shake/smoothie recipes Recommend daily protein shake, suggested choosing higher calorie shakes - samples of Boost VHC, CIB, Ensure Complete + coupons Briefly discussed possible radiation treatment side effects Contact information    MONITORING, EVALUATION, GOAL: wt trends, intake   NEXT VISIT: Friday February 13 during infusion

## 2024-07-22 NOTE — Patient Instructions (Signed)
 [facility] - A DEPT OF [facility]. [facility]  Discharge Instructions: Thank you for choosing [facility] to provide your oncology and hematology care.   If you have a lab appointment with the Cancer Center, please go directly to the Cancer Center and check in at the registration area.   Wear comfortable clothing and clothing appropriate for easy access to any Portacath or PICC line.   We strive to give you quality time with your provider. You may need to reschedule your appointment if you arrive late (15 or more minutes).  Arriving late affects you and other patients whose appointments are after yours.  Also, if you miss three or more appointments without notifying the office, you may be dismissed from the clinic at the providers discretion.      For prescription refill requests, have your pharmacy contact our office and allow 72 hours for refills to be completed.    Today you received the following chemotherapy and/or immunotherapy agents: Paclitaxel  (Taxol ) and Carboplatin  (Paraplatin )      To help prevent nausea and vomiting after your treatment, we encourage you to take your nausea medication as directed.  BELOW ARE SYMPTOMS THAT SHOULD BE REPORTED IMMEDIATELY: *FEVER GREATER THAN 100.4 F (38 C) OR HIGHER *CHILLS OR SWEATING *NAUSEA AND VOMITING THAT IS NOT CONTROLLED WITH YOUR NAUSEA MEDICATION *UNUSUAL SHORTNESS OF BREATH *UNUSUAL BRUISING OR BLEEDING *URINARY PROBLEMS (pain or burning when urinating, or frequent urination) *BOWEL PROBLEMS (unusual diarrhea, constipation, pain near the anus) TENDERNESS IN MOUTH AND THROAT WITH OR WITHOUT PRESENCE OF ULCERS (sore throat, sores in mouth, or a toothache) UNUSUAL RASH, SWELLING OR PAIN  UNUSUAL VAGINAL DISCHARGE OR ITCHING   Items with * indicate a potential emergency and should be followed up as soon as possible or go to the Emergency Department if any problems should occur.  Please show the  CHEMOTHERAPY ALERT CARD or IMMUNOTHERAPY ALERT CARD at check-in to the Emergency Department and triage nurse.  Should you have questions after your visit or need to cancel or reschedule your appointment, please contact [facility] - A DEPT OF [facility]  Dept: [phone]  and follow the prompts.  Office hours are 8:00 a.m. to 4:30 p.m. Monday - Friday. Please note that voicemails left after 4:00 p.m. may not be returned until the following business day.  We are closed weekends and major holidays. You have access to a nurse at all times for urgent questions. Please call the main number to the clinic Dept: [phone] and follow the prompts.   For any non-urgent questions, you may also contact your provider using MyChart. We now offer e-Visits for anyone 89 and older to request care online for non-urgent symptoms. For details visit [email or web address].   Also download the MyChart app! Go to the app store, search MyChart, open the app, select [facility], and log in with your MyChart username and password.  Paclitaxel  Injection What is this medication? PACLITAXEL  (PAK li TAX el) treats some types of cancer. It works by slowing down the growth of cancer cells. This medicine may be used for other purposes; ask your health care provider or pharmacist if you have questions. COMMON BRAND NAME(S): Onxol, Taxol  What should I tell my care team before I take this medication? They need to know if you have any of these conditions: Heart disease Liver disease Low white blood cell levels An unusual or allergic reaction to  paclitaxel , other medications, foods, dyes, or preservatives If you or your partner are pregnant or trying to get pregnant Breast-feeding How should I use this medication? This medication is infused into a vein. It is given by your care team in a hospital or clinic setting. Talk to your care team about the use of this medication in children. Special  care may be needed. Overdosage: If you think you have taken too much of this medicine contact a poison control center or emergency room at once. NOTE: This medicine is only for you. Do not share this medicine with others. What if I miss a dose? Keep appointments for follow-up doses. It is important not to miss your dose. Call your care team if you are unable to keep an appointment. What may interact with this medication? Do not take this medication with any of the following: Live virus vaccines Other medications may affect the way this medication works. Talk with your care team about all of the medications you take. They may suggest changes to your treatment plan to lower the risk of side effects and to make sure your medications work as intended. This list may not describe all possible interactions. Give your health care provider a list of all the medicines, herbs, non-prescription drugs, or dietary supplements you use. Also tell them if you smoke, drink alcohol, or use illegal drugs. Some items may interact with your medicine. What should I watch for while using this medication? Your condition will be monitored carefully while you are receiving this medication. You may need blood work while taking this medication. This medication may make you feel generally unwell. This is not uncommon as chemotherapy can affect healthy cells as well as cancer cells. Report any side effects. Continue your course of treatment even though you feel ill unless your care team tells you to stop. This medication can cause serious allergic reactions. To reduce the risk, your care team may give you other medications to take before receiving this one. Be sure to follow the directions from your care team. This medication may increase your risk of getting an infection. Call your care team for advice if you get a fever, chills, sore throat, or other symptoms of a cold or flu. Do not treat yourself. Try to avoid being around  people who are sick. This medication may increase your risk to bruise or bleed. Call your care team if you notice any unusual bleeding. Be careful brushing or flossing your teeth or using a toothpick because you may get an infection or bleed more easily. If you have any dental work done, tell your dentist you are receiving this medication. Talk to your care team if you may be pregnant. Serious birth defects can occur if you take this medication during pregnancy. Talk to your care team before breastfeeding. Changes to your treatment plan may be needed. What side effects may I notice from receiving this medication? Side effects that you should report to your care team as soon as possible: Allergic reactions--skin rash, itching, hives, swelling of the face, lips, tongue, or throat Heart rhythm changes--fast or irregular heartbeat, dizziness, feeling faint or lightheaded, chest pain, trouble breathing Increase in blood pressure Infection--fever, chills, cough, sore throat, wounds that don't heal, pain or trouble when passing urine, general feeling of discomfort or being unwell Low blood pressure--dizziness, feeling faint or lightheaded, blurry vision Low red blood cell level--unusual weakness or fatigue, dizziness, headache, trouble breathing Painful swelling, warmth, or redness of the skin, blisters or  sores at the infusion site Pain, tingling, or numbness in the hands or feet Slow heartbeat--dizziness, feeling faint or lightheaded, confusion, trouble breathing, unusual weakness or fatigue Unusual bruising or bleeding Side effects that usually do not require medical attention (report to your care team if they continue or are bothersome): Diarrhea Hair loss Joint pain Loss of appetite Muscle pain Nausea Vomiting This list may not describe all possible side effects. Call your doctor for medical advice about side effects. You may report side effects to FDA at 1-800-FDA-1088. Where should I keep  my medication? This medication is given in a hospital or clinic. It will not be stored at home. NOTE: This sheet is a summary. It may not cover all possible information. If you have questions about this medicine, talk to your doctor, pharmacist, or health care provider.  2025 Elsevier/Gold Standard (2024-04-08 00:00:00)  Carboplatin  Injection What is this medication? CARBOPLATIN  (KAR boe pla tin) treats some types of cancer. It works by slowing down the growth of cancer cells. This medicine may be used for other purposes; ask your health care provider or pharmacist if you have questions. COMMON BRAND NAME(S): Paraplatin  What should I tell my care team before I take this medication? They need to know if you have any of these conditions: Blood disorders Hearing problems Kidney disease Recent or ongoing radiation therapy An unusual or allergic reaction to carboplatin , cisplatin, other medications, foods, dyes, or preservatives Pregnant or trying to get pregnant Breast-feeding How should I use this medication? This medication is injected into a vein. It is given by your care team in a hospital or clinic setting. Talk to your care team about the use of this medication in children. Special care may be needed. Overdosage: If you think you have taken too much of this medicine contact a poison control center or emergency room at once. NOTE: This medicine is only for you. Do not share this medicine with others. What if I miss a dose? Keep appointments for follow-up doses. It is important not to miss your dose. Call your care team if you are unable to keep an appointment. What may interact with this medication? Medications for seizures Some antibiotics, such as amikacin, gentamicin, neomycin, streptomycin, tobramycin Vaccines This list may not describe all possible interactions. Give your health care provider a list of all the medicines, herbs, non-prescription drugs, or dietary supplements you  use. Also tell them if you smoke, drink alcohol, or use illegal drugs. Some items may interact with your medicine. What should I watch for while using this medication? Your condition will be monitored carefully while you are receiving this medication. You may need blood work while taking this medication. This medication may make you feel generally unwell. This is not uncommon, as chemotherapy can affect healthy cells as well as cancer cells. Report any side effects. Continue your course of treatment even though you feel ill unless your care team tells you to stop. In some cases, you may be given additional medications to help with side effects. Follow all directions for their use. This medication may increase your risk of getting an infection. Call your care team for advice if you get a fever, chills, sore throat, or other symptoms of a cold or flu. Do not treat yourself. Try to avoid being around people who are sick. Avoid taking medications that contain aspirin, acetaminophen, ibuprofen, naproxen, or ketoprofen unless instructed by your care team. These medications may hide a fever. Be careful brushing or flossing your teeth  or using a toothpick because you may get an infection or bleed more easily. If you have any dental work done, tell your dentist you are receiving this medication. Talk to your care team if you wish to become pregnant or think you might be pregnant. This medication can cause serious birth defects. Talk to your care team about effective forms of contraception. Do not breast-feed while taking this medication. What side effects may I notice from receiving this medication? Side effects that you should report to your care team as soon as possible: Allergic reactions--skin rash, itching, hives, swelling of the face, lips, tongue, or throat Infection--fever, chills, cough, sore throat, wounds that don't heal, pain or trouble when passing urine, general feeling of discomfort or being  unwell Low red blood cell level--unusual weakness or fatigue, dizziness, headache, trouble breathing Pain, tingling, or numbness in the hands or feet, muscle weakness, change in vision, confusion or trouble speaking, loss of balance or coordination, trouble walking, seizures Unusual bruising or bleeding Side effects that usually do not require medical attention (report to your care team if they continue or are bothersome): Hair loss Nausea Unusual weakness or fatigue Vomiting This list may not describe all possible side effects. Call your doctor for medical advice about side effects. You may report side effects to FDA at 1-800-FDA-1088. Where should I keep my medication? This medication is given in a hospital or clinic. It will not be stored at home. NOTE: This sheet is a summary. It may not cover all possible information. If you have questions about this medicine, talk to your doctor, pharmacist, or health care provider.  2024 Elsevier/Gold Standard (2021-09-25 00:00:00)

## 2024-07-22 NOTE — Progress Notes (Signed)
 "  [facility]  ONCOLOGY CLINIC PROGRESS NOTE   Patient Care Team: Tanda Bleacher, MD as PCP - General (Family Medicine) Ardis, Evalene CROME, RN as Oncology Nurse Navigator Charlanne Groom, MD as Consulting Physician (Gastroenterology) Dewey Rush, MD as Consulting Physician (Radiation Oncology) Autumn Millman, MD as Consulting Physician (Oncology)  PATIENT NAME: Dustin Maynard   MR#: [id number] DOB: 07-Aug-1945  Date of visit: 07/22/2024   ASSESSMENT & PLAN:   Dustin Maynard is a 79 y.o.  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  He presented with dysphagia but able to tolerate modified solid foods. Imaging reveals a large esophageal mass with regional lymphadenopathy and no distant metastases. Multidisciplinary management is planned, with initial concurrent chemoradiation.   Clinically appears to be T3, N1, M0 disease.  He had consultation with radiation oncology Dr. Rush Dewey on 06/08/2024. Presented to our clinic to establish care on 06/09/2024.  Discussed diagnosis, treatment options, NCCN guidelines.  Recommended evaluation by cardiothoracic surgery, Dr. Lightfoot, in preparation for treatment planning.   Staging PET scan on 06/16/2024 showed hypermetabolic activity in the distal esophagus extending from carina to the GE junction. Concern for tumor extension into the herniated gastric cardia. No hypermetabolic mediastinal, gastrohepatic ligament, or paraesophageal lymph nodes. No evidence of FDG-avid distant metastasis. Small bilateral pulmonary nodules described on the comparison CT are below PET resolution and remain indeterminate. Favor benign.  Explained the rarity of signet ring cell carcinoma in the esophagus  and aggressive nature of the malignancy.  It is also relatively less chemosensitive, compared to conventional adenocarcinoma.  Patient had consultation with Dr. Shyrl on 07/09/2024.  Dr. Shyrl felt that he would be a good candidate for minimally invasive Ivor Lewis esophagectomy.  Patient wanted to proceed with chemoradiation first followed by consideration for surgery, if he continues to do well.  Risks of chemoradiation include fatigue, cytopenias, nausea, neuropathy, and infusion reactions.  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  MSI stable disease.  No other actionable mutations.  Tissue testing results pending.  Likely somatic BRCA2 mutation.  Genetic counseling referral will be sent.  He started radiation treatments from 07/20/2024.  Labs reveal no dose-limiting toxicities.  Will proceed with cycle 1 of carboplatin  and paclitaxel  today at standard dosing and plan to continue this weekly during the course of radiation.  We will reach out to Dr. Lang office at least 2 weeks prior to completion of treatments for follow-up evaluation and possible consideration for surgery 4 to 6 weeks after completion of chemoradiation.  Antineoplastic chemotherapy and radiation therapy management Receiving standard dose weekly carboplatin  and paclitaxel  with concurrent external beam radiation, dosed per height, weight, and renal function. Fatigue is present but manageable. No significant complications to date. Neoadjuvant treatment intent, with possible surgery contingent on response and preference. - Continued weekly chemotherapy and daily radiation per protocol. - Instructed to take dexamethasone  4 mg, two tablets daily for two days starting the day after chemotherapy to prevent nausea and improve energy. - Instructed to use ondansetron  and prochlorperazine  as needed for nausea. - Monitored for adverse effects including fatigue, nausea, vomiting, cytopenias,  dysphagia, pain, and weight loss. - Adjusted chemotherapy dose or held treatment as indicated by symptoms and laboratory findings. - Weekly follow-up visits  for toxicity assessment and laboratory monitoring.  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 No acute symptoms attributable to anemia. - Monitored hemoglobin and hematocrit during chemoradiation. - Assessed for symptoms of anemia including fatigue and dyspnea at follow-up. - Adjusted treatment if anemia worsened or became symptomatic.  Pulmonary nodules, stable Multiple small pulmonary nodules, largest 5 mm, stable on serial imaging and not FDG-avid on PET scan. No evidence of lymph node involvement or metastatic disease. Low suspicion for malignancy; surveillance warranted. - Reviewed PET scan and CT confirming stability and lack of concerning features. - Planned ongoing surveillance of pulmonary nodules. - To be discussed at upcoming tumor board for multidisciplinary input.  I reviewed lab results and outside records for this visit and discussed relevant results with the patient. Diagnosis, plan of care and treatment options were also discussed in detail with the patient. Opportunity provided to ask questions and answers provided to his apparent satisfaction. Provided instructions to call our clinic with any problems, questions or concerns prior to return visit. I recommended to continue follow-up with PCP and sub-specialists. He verbalized understanding and agreed with the plan.   NCCN guidelines have been consulted in the planning of this patients care.  I spent a total of 43 minutes during this encounter with the patient including review of chart and various tests results, discussions about plan of care and coordination of care plan.   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 Started treatments from 07/20/2024.  INTERVAL HISTORY:    Discussed the use of AI scribe software for clinical note transcription with the patient, who gave verbal consent to proceed.  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  He is currently on day 3 of his initial cycle of concurrent chemoradiation with carboplatin , paclitaxel , and external beam radiation. He has tolerated treatment well, reporting only mild fatigue and somnolence on the second day after radiation. He denies pain, dysphagia, odynophagia, or significant nausea since starting therapy. Weight remains stable at 175 lbs, with only a slight decrease in belt size.  He has received prescriptions for antiemetics, including prochlorperazine  and dexamethasone , and understands the dosing schedule. Most antiemetics have not yet been required. Laboratory studies reveal mild anemia with hemoglobin of 12.4 g/dL (previously 87.0 g/dL). White blood cell count, platelets, renal and hepatic function, and electrolytes are within normal limits.  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 Discussed diagnosis, treatment options, and NCCN guidelines; planned for concurrent chemoradiation with carboplatin  and paclitaxel , with surgical resection to be considered based on response. PET scan showed hypermetabolic distal esophageal mass without distant  metastases. Port-a-cath placed for chemotherapy, chronic gluteal cyst treated empirically with antibiotics, and pulmonary nodules stable on imaging. Genetic testing and tumor board review planned.      I have reviewed the past medical history,  past surgical history, social history and family history with the patient and they are unchanged from previous note.  HISTORY OF PRESENT ILLNESS:   ONCOLOGY HISTORY:   Patient started having intermittent dysphagia, especially to solids approximately in early 2025.  He also had episodic epigastric discomfort.  With these symptoms, he was referred to gastroenterology and was seen by Dr. Charlanne.   EGD on 06/03/2024 showed large, circumferential, fungating and ulcerating mass in the lower third of the esophagus, 30 cm from the incisors.  Mass was partially obstructing and would not allow passage of EGD scope.  There was contact bleeding.  Multiple biopsies were taken with a cold forceps.   Pathology from the esophageal mass came back positive for signet ring cell carcinoma.   He had staging CT chest, abdomen and pelvis with contrast on 06/07/2024 which showed circumferential thickening of the distal esophagus consistent with primary esophageal carcinoma.  Single paraesophageal lymph node adjacent to the small hiatal hernia.  Tiny indeterminate pulmonary nodules including a new right middle lobe lung nodule and a decreased size of left lower lobe nodule, largest measuring 4 mm.  No other evidence of metastatic disease in the abdomen or pelvis.   Clinically appears to be T3, N1, M0 disease.   He had consultation with radiation oncology Dr. Norleen Limes on 06/08/2024.   Presented to our clinic to establish care on 06/09/2024.   Discussed diagnosis, treatment options, NCCN guidelines.  Recommended evaluation by cardiothoracic surgery, Dr. Lightfoot, in preparation for treatment planning.   Staging PET scan on 06/16/2024 showed hypermetabolic activity in the  distal esophagus extending from carina to the GE junction. Concern for tumor extension into the herniated gastric cardia. No hypermetabolic mediastinal, gastrohepatic ligament, or paraesophageal lymph nodes. No evidence of FDG-avid distant metastasis. Small bilateral pulmonary nodules described on the comparison CT are below PET resolution and remain indeterminate. Favor benign.   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  It is also relatively less chemosensitive, compared to conventional adenocarcinoma.  Patient had consultation with Dr. Shyrl on 07/09/2024.  Dr. Shyrl felt that he would be a good candidate for minimally invasive Ivor Lewis esophagectomy.  Patient wanted to proceed with chemoradiation first followed by consideration for surgery, if he continues to do well.  Started concurrent chemoradiation with weekly carboplatin /paclitaxel  during the week of 07/20/2024.  Guardant 360 liquid biopsy/ NGS testing showed BRCA2 V160V mutation (?  Somatic mutation).  MSI stable disease.  No other actionable mutations.  Tissue testing results pending.  Genetic counseling referral will be sent.  Oncology History  Signet ring cell carcinoma of esophagus  06/09/2024 Initial Diagnosis   Signet ring cell carcinoma of esophagus   06/09/2024 Cancer Staging   Staging form: Exocrine Pancreas, AJCC 8th Edition - Clinical: Stage IIB (cT3, cN1, cM0) - Signed by Autumn Millman, MD on 06/09/2024   07/22/2024 -  Chemotherapy   Patient is on Treatment Plan : ESOPHAGUS Carboplatin  + Paclitaxel  Weekly X 6 Weeks with XRT         REVIEW OF SYSTEMS:   Review of Systems - Oncology  All other pertinent systems were reviewed with the patient and are negative.  ALLERGIES: He has no known allergies.  MEDICATIONS:  Current Outpatient Medications  Medication Sig Dispense Refill   amLODipine (NORVASC) 10 MG tablet      Ascorbic Acid  (VITAMIN C) 1000 MG tablet Take 500 mg by mouth daily.  dexamethasone  (DECADRON ) 4 MG tablet Take 2 tablets daily for 2 days, start the day after chemotherapy. Take with food. 30 tablet 1   Flaxseed, Linseed, (FLAX SEEDS PO) Take by mouth.     GARLIC PO Take by mouth.     lidocaine -prilocaine  (EMLA ) cream Apply to affected area once 30 g 3   lisinopril (PRINIVIL,ZESTRIL) 10 MG tablet      Multiple Vitamins-Minerals (CENTRUM SILVER PO) Take by mouth.     niacin 500 MG tablet Take 500 mg by mouth at bedtime.     ondansetron  (ZOFRAN ) 8 MG tablet Take 1 tablet (8 mg total) by mouth every 8 (eight) hours as needed for nausea or vomiting. Start on the third day after chemotherapy. 30 tablet 1   prochlorperazine  (COMPAZINE ) 10 MG tablet Take 1 tablet (10 mg total) by mouth every 6 (six) hours as needed for nausea or vomiting. 30 tablet 1   sildenafil (REVATIO) 20 MG tablet Take 20 mg by mouth as needed.     sulfamethoxazole -trimethoprim  (BACTRIM  DS) 800-160 MG tablet Take 1 tablet by mouth 2 (two) times daily. 14 tablet 0   No current facility-administered medications for this visit.   Facility-Administered Medications Ordered in Other Visits  Medication Dose Route Frequency Provider Last Rate Last Admin   0.9 %  sodium chloride  infusion   Intravenous Continuous Amy Gothard, MD 10 mL/hr at 07/22/24 1125 New Bag at 07/22/24 1125   CARBOplatin  (PARAPLATIN ) 190 mg in sodium chloride  0.9 % 100 mL chemo infusion  190 mg Intravenous Once Betzabe Bevans, MD       PACLitaxel  (TAXOL ) 102 mg in sodium chloride  0.9 % 250 mL chemo infusion (</= 80mg /m2)  50 mg/m2 (Treatment Plan Recorded) Intravenous Once Pritika Alvarez, MD 267 mL/hr at 07/22/24 1248 102 mg at 07/22/24 1248     VITALS:   Blood pressure 125/75, pulse 84, temperature 97.9 F (36.6 C), temperature source Temporal, resp. rate 17, height 5' 10 (1.778 m), weight 175 lb 3.2 oz (79.5 kg), SpO2 100%.  Wt Readings from Last 3 Encounters:   07/22/24 175 lb 3.2 oz (79.5 kg)  07/09/24 174 lb 11.2 oz (79.2 kg)  06/23/24 176 lb (79.8 kg)    Body mass index is 25.14 kg/m.      PHYSICAL EXAM:   Physical Exam Constitutional:      General: He is not in acute distress.    Appearance: Normal appearance.  HENT:     Head: Normocephalic and atraumatic.  Eyes:     Conjunctiva/sclera: Conjunctivae normal.  Cardiovascular:     Rate and Rhythm: Normal rate and regular rhythm.  Pulmonary:     Effort: Pulmonary effort is normal. No respiratory distress.  Chest:     Comments: Right-sided Port-A-Cath in place without any signs of infection Abdominal:     General: There is no distension.  Skin:    Comments: Left-sided gluteal cyst versus subcutaneous abscess without obvious discharge  Neurological:     General: No focal deficit present.     Mental Status: He is alert and oriented to person, place, and time.  Psychiatric:        Mood and Affect: Mood normal.        Behavior: Behavior normal.      LABORATORY DATA:   I have reviewed the data as listed.  Results for orders placed or performed in visit on 07/22/24  Rad Onc Aria Session Summary  Result Value Ref Range   Course ID [id number]  Course Start Date 07/15/2024    Session Number 3    Course First Treatment Date 07/20/2024 10:36 AM    Course Last Treatment Date 07/22/2024 10:26 AM    Course Elapsed Days 2    Reference Point ID Esoph dp    Reference Point Dosage Given to Date 4.60000002 Gy   Reference Point Session Dosage Given 1.79999999 Gy   Plan ID Esoph    Plan Name simchesteso    Plan Fractions Treated to Date 3    Plan Total Fractions Prescribed 25    Plan Prescribed Dose Per Fraction 1.8 Gy   Plan Total Prescribed Dose 45.000000 Gy   Plan Primary Reference Point Esoph dp   Results for orders placed or performed in visit on 07/22/24  CMP (Cancer Center only)  Result Value Ref Range   Sodium 139 135 - 145 mmol/L   Potassium 4.1 3.5 - 5.1 mmol/L    Chloride 105 98 - 111 mmol/L   CO2 24 22 - 32 mmol/L   Glucose, Bld 119 (H) 70 - 99 mg/dL   BUN 12 8 - 23 mg/dL   Creatinine 9.11 9.38 - 1.24 mg/dL   Calcium 9.0 8.9 - 89.6 mg/dL   Total Protein 7.0 6.5 - 8.1 g/dL   Albumin 4.2 3.5 - 5.0 g/dL   AST 17 15 - 41 U/L   ALT 15 0 - 44 U/L   Alkaline Phosphatase 70 38 - 126 U/L   Total Bilirubin 0.4 0.0 - 1.2 mg/dL   GFR, Estimated >39 >39 mL/min   Anion gap 10 5 - 15  CBC with Differential (Cancer Center Only)  Result Value Ref Range   WBC Count 7.5 4.0 - 10.5 K/uL   RBC 4.58 4.22 - 5.81 MIL/uL   Hemoglobin 12.4 (L) 13.0 - 17.0 g/dL   HCT 62.8 (L) 60.9 - 47.9 %   MCV 81.0 80.0 - 100.0 fL   MCH 27.1 26.0 - 34.0 pg   MCHC 33.4 30.0 - 36.0 g/dL   RDW 84.9 88.4 - 84.4 %   Platelet Count 264 150 - 400 K/uL   nRBC 0.0 0.0 - 0.2 %   Neutrophils Relative % 69 %   Neutro Abs 5.1 1.7 - 7.7 K/uL   Lymphocytes Relative 19 %   Lymphs Abs 1.4 0.7 - 4.0 K/uL   Monocytes Relative 10 %   Monocytes Absolute 0.7 0.1 - 1.0 K/uL   Eosinophils Relative 2 %   Eosinophils Absolute 0.1 0.0 - 0.5 K/uL   Basophils Relative 0 %   Basophils Absolute 0.0 0.0 - 0.1 K/uL   Immature Granulocytes 0 %   Abs Immature Granulocytes 0.02 0.00 - 0.07 K/uL      RADIOGRAPHIC STUDIES:  I have personally reviewed the radiological images as listed and agree with the findings in the report.  NM PET Image Initial (PI) Skull Base To Thigh EXAM: PET AND CT SKULL BASE TO MID THIGH 06/16/2024 11:27:29 AM  TECHNIQUE:  RADIOPHARMACEUTICAL: 10.14 mCi F-18 FDG. Given by Right AC IV at 10:03 am by SDH. Uptake time 60 minutes. Glucose level 93 mg/dl.  PET imaging was acquired from the base of the skull to the mid thighs. Non-contrast enhanced computed tomography was obtained for attenuation correction and anatomic localization.  COMPARISON: CT 06/07/2024.  CLINICAL HISTORY: Esophageal cancer, staging. Malignant neoplasm of lower third of esophagus. NPO, No DM.  06/03/24 Biopsy of esophageal mass.  FINDINGS:  HEAD AND NECK: No metabolically active cervical lymphadenopathy.  CHEST: Hypermetabolic activity in the distal esophagus extending from carina to the GE junction. Activity is moderately intense with SUV max of 6.6. Activity extends into the gastric cardia with SUV max equal to 5.1. There is a small hiatal hernia. The small paraesophageal lymph node described on comparison CT measures 8 mm on image 79. There is no associated metabolic activity. No hypermetabolic mediastinal lymph nodes. The 2 small pulmonary nodules in the right middle lobe and left lower lobe described on comparison CT do not have associated metabolic activity. These small nodules are 5 mm in size and not well assessed by PET imaging due to small size (image 65 and image 72 in the right middle lobe and left lower lobe respectively).  ABDOMEN AND PELVIS: No hypermetabolic gastrohepatic ligament lymph nodes. No liver metastasis. Physiologic activity within the gastrointestinal and genitourinary systems.  BONES AND SOFT TISSUE: No skeletal metastasis. No metabolically active aggressive osseous lesion.  IMPRESSION: 1. Hypermetabolic activity in the distal esophagus extending from carina to the GE junction. Concern for tumor extension into the herniated gastric cardia. . 2. No hypermetabolic mediastinal, gastrohepatic ligament, or paraesophageal lymph nodes. 3. No evidence of FDG-avid distant metastasis. 4. Small bilateral pulmonary nodules described on the comparison CT are below PET resolution and remain indeterminate. Favor benign. .  Electronically signed by: Norleen Boxer MD 06/21/2024 11:50 AM EST RP Workstation: [id number] IR IMAGING GUIDED PORT INSERTION CLINICAL DATA:  Esophageal carcinoma and need for porta cath for chemotherapy.  EXAM: IMPLANTED PORT A CATH PLACEMENT WITH ULTRASOUND AND FLUOROSCOPIC GUIDANCE  ANESTHESIA/SEDATION: Moderate (conscious)  sedation was employed during this procedure. A total of Versed  2.0 mg and Fentanyl  100 mcg was administered intravenously.  Moderate Sedation Time: 43 minutes. [name] level of consciousness and vital signs were monitored continuously by radiology nursing throughout the procedure under my direct supervision.  FLUOROSCOPY: Radiation Exposure Index: 3.0 mGy Kerma  PROCEDURE: The procedure, risks, benefits, and alternatives were explained to the patient. Questions regarding the procedure were encouraged and answered. The patient understands and consents to the procedure. A time-out was performed prior to initiating the procedure.  Ultrasound was utilized to confirm patency of the right internal jugular vein. An ultrasound image was saved and recorded. The right neck and chest were prepped with chlorhexidine in a sterile fashion, and a sterile drape was applied covering the operative field. Maximum barrier sterile technique with sterile gowns and gloves were used for the procedure. Local anesthesia was provided with 1% lidocaine .  After creating a small venotomy incision, a 21 gauge needle was advanced into the right internal jugular vein under direct, real-time ultrasound guidance. Ultrasound image documentation was performed. After securing guidewire access, an 8 Fr dilator was placed. A J-wire was kinked to measure appropriate catheter length.  A subcutaneous port pocket was then created along the upper chest wall utilizing sharp and blunt dissection. Portable cautery was utilized. The pocket was irrigated with sterile saline.  A single lumen power injectable port was chosen for placement. The 8 Fr catheter was tunneled from the port pocket site to the venotomy incision. The port was placed in the pocket. External catheter was trimmed to appropriate length based on guidewire measurement.  At the venotomy, an 8 Fr peel-away sheath was placed over a guidewire. The  catheter was then placed through the sheath and the sheath removed. Final catheter positioning was confirmed and documented with a fluoroscopic spot image. The port was accessed with a needle and aspirated and flushed with heparinized  saline. The access needle was removed.  The venotomy and port pocket incisions were closed with subcutaneous 3-0 Monocryl and subcuticular 4-0 Vicryl. Dermabond was applied to both incisions.  COMPLICATIONS: COMPLICATIONS None  FINDINGS: After catheter placement, the tip lies at the cavo-atrial junction. The catheter aspirates normally and is ready for immediate use.  IMPRESSION: Placement of single lumen port a cath via right internal jugular vein. The catheter tip lies at the cavo-atrial junction. A power injectable port a cath was placed and is ready for immediate use.  Electronically Signed   By: Marcey Moan M.D.   On: 06/21/2024 11:25   CODE STATUS:  Code Status History     Date Active Date Inactive Code Status Order ID Comments User Context   06/21/2024 1100 06/22/2024 0509 Full Code [id number]  Moan Marcey, MD HOV    Questions for Most Recent Historical Code Status (Order [id number])     Question Answer   By: Procedural case: previous code status reviewed            Orders Placed This Encounter  Procedures   Ambulatory referral to Genetics    Referral Priority:   Routine    Referral Type:   Consultation    Referral Reason:   Specialty Services Required    Number of Visits Requested:   1     Future Appointments  Date Time Provider Department Center  07/23/2024  9:45 AM [facility] 3 [facility] None  07/23/2024 10:00 AM [facility] None  07/26/2024 10:15 AM [facility] LINAC 4 [facility] None  07/27/2024 10:15 AM [facility] LINAC 4 [facility] None  07/28/2024 10:15 AM [facility] LINAC 4 [facility] None  07/29/2024 10:45 AM [facility] LINAC 4 [facility] None  07/30/2024 10:30 AM [facility] [id number]  [facility] None  07/30/2024 11:30 AM [facility] FLUSH [facility] None  07/30/2024 11:45 AM Seema Blum, MD [facility] None  07/30/2024 12:30 PM [facility] INFUSION [facility] None  08/02/2024 11:30 AM [facility] LINAC 4 [facility] None  08/03/2024 10:15 AM [facility] [id number] [facility] None  08/04/2024 10:30 AM [facility] LINAC 3 [facility] None  08/05/2024 10:30 AM [facility] LINAC 3 [facility] None  08/05/2024 10:45 AM [facility] FLUSH [facility] None  08/05/2024 11:00 AM Joeanne Robicheaux, MD [facility] None  08/05/2024 11:30 AM [facility] INFUSION [facility] None  08/06/2024 10:30 AM [facility] LINAC 3 [facility] None  08/06/2024  2:00 PM Lightfoot, Linnie KIDD, MD [facility] H&V  08/09/2024 10:15 AM [facility] LINAC 4 [facility] None  08/10/2024 10:15 AM [facility] LINAC 4 [facility] None  08/11/2024 10:15 AM [facility] LINAC 4 [facility] None  08/12/2024  8:45 AM [facility] LINAC 4 [facility] None  08/12/2024  9:15 AM [facility] FLUSH [facility] None  08/12/2024  9:45 AM Tritia Endo, MD [facility] None  08/12/2024 10:15 AM [facility] INFUSION [facility] None  08/13/2024 10:15 AM [facility] LINAC 4 [facility] None  08/16/2024 10:15 AM [facility] LINAC 4 [facility] None  08/17/2024 10:15 AM [facility] [id number] [facility] None  08/18/2024 10:15 AM [facility] LINAC 4 [facility] None  08/19/2024 10:15 AM [facility] LINAC 4 [facility] None  08/20/2024 10:15 AM [facility] LINAC 4 [facility] None  08/23/2024 10:15 AM [facility] LINAC 4 [facility] None  08/24/2024 10:15 AM [facility] LINAC 4 [facility] None  08/25/2024 10:15 AM [facility] LINAC 4 [facility] None  08/26/2024 10:15 AM [facility] LINAC 4 [facility] None     This document was completed utilizing speech recognition software. Grammatical errors, random word insertions, pronoun errors, and incomplete sentences are an occasional consequence of this system due to software limitations, ambient noise, and  hardware issues. Any formal questions or concerns about the content, text or information contained within the body of this dictation should be directly addressed to the provider for clarification.   "

## 2024-07-22 NOTE — Assessment & Plan Note (Addendum)
 Please review oncology history for additional details and timeline of events.  He presented with dysphagia but able to tolerate modified solid foods. Imaging reveals a large esophageal mass with regional lymphadenopathy and no distant metastases. Multidisciplinary management is planned, with initial concurrent chemoradiation.   Clinically appears to be T3, N1, M0 disease.  He had consultation with radiation oncology Dr. Norleen Limes on 06/08/2024. Presented to our clinic to establish care on 06/09/2024.  Discussed diagnosis, treatment options, NCCN guidelines.  Recommended evaluation by cardiothoracic surgery, Dr. Lightfoot, in preparation for treatment planning.   Staging PET scan on 06/16/2024 showed hypermetabolic activity in the distal esophagus extending from carina to the GE junction. Concern for tumor extension into the herniated gastric cardia. No hypermetabolic mediastinal, gastrohepatic ligament, or paraesophageal lymph nodes. No evidence of FDG-avid distant metastasis. Small bilateral pulmonary nodules described on the comparison CT are below PET resolution and remain indeterminate. Favor benign.  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  It is also relatively less chemosensitive, compared to conventional adenocarcinoma.  Patient had consultation with Dr. Shyrl on 07/09/2024.  Dr. Shyrl felt that he would be a good candidate for minimally invasive Ivor Lewis esophagectomy.  Patient wanted to proceed with chemoradiation first followed by consideration for surgery, if he continues to do well.  Risks of chemoradiation include fatigue, cytopenias, nausea, neuropathy, and infusion reactions.  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  MSI stable disease.  No other actionable mutations.  Tissue testing results pending.  Likely somatic BRCA2 mutation.  Genetic counseling referral will be  sent.  He started radiation treatments from 07/20/2024.  Labs reveal no dose-limiting toxicities.  Will proceed with cycle 1 of carboplatin  and paclitaxel  today at standard dosing and plan to continue this weekly during the course of radiation.  We will reach out to Dr. Lang office at least 2 weeks prior to completion of treatments for follow-up evaluation and possible consideration for surgery 4 to 6 weeks after completion of chemoradiation.

## 2024-07-23 ENCOUNTER — Ambulatory Visit: Admission: RE | Admit: 2024-07-23 | Source: Ambulatory Visit

## 2024-07-23 ENCOUNTER — Other Ambulatory Visit: Payer: Self-pay

## 2024-07-23 ENCOUNTER — Ambulatory Visit: Admission: RE | Admit: 2024-07-23

## 2024-07-23 DIAGNOSIS — C155 Malignant neoplasm of lower third of esophagus: Secondary | ICD-10-CM

## 2024-07-23 LAB — RAD ONC ARIA SESSION SUMMARY
Course Elapsed Days: 3
Plan Fractions Treated to Date: 4
Plan Prescribed Dose Per Fraction: 1.8 Gy
Plan Total Fractions Prescribed: 25
Plan Total Prescribed Dose: 45 Gy
Reference Point Dosage Given to Date: 7.2 Gy
Reference Point Session Dosage Given: 1.8 Gy
Session Number: 4

## 2024-07-23 MED ORDER — SONAFINE EX EMUL
1.0000 | Freq: Once | CUTANEOUS | Status: AC
  Administered 2024-07-23: 1 via TOPICAL

## 2024-07-26 ENCOUNTER — Ambulatory Visit

## 2024-07-27 ENCOUNTER — Ambulatory Visit

## 2024-07-28 ENCOUNTER — Ambulatory Visit

## 2024-07-29 ENCOUNTER — Ambulatory Visit

## 2024-07-30 ENCOUNTER — Inpatient Hospital Stay: Admitting: Dietician

## 2024-07-30 ENCOUNTER — Inpatient Hospital Stay

## 2024-07-30 ENCOUNTER — Ambulatory Visit

## 2024-07-30 ENCOUNTER — Inpatient Hospital Stay: Admitting: Oncology

## 2024-08-02 ENCOUNTER — Ambulatory Visit

## 2024-08-03 ENCOUNTER — Ambulatory Visit

## 2024-08-04 ENCOUNTER — Ambulatory Visit

## 2024-08-05 ENCOUNTER — Ambulatory Visit

## 2024-08-05 ENCOUNTER — Inpatient Hospital Stay: Admitting: Oncology

## 2024-08-05 ENCOUNTER — Inpatient Hospital Stay

## 2024-08-06 ENCOUNTER — Telehealth: Admitting: Thoracic Surgery (Cardiothoracic Vascular Surgery)

## 2024-08-06 ENCOUNTER — Ambulatory Visit

## 2024-08-09 ENCOUNTER — Ambulatory Visit

## 2024-08-10 ENCOUNTER — Ambulatory Visit

## 2024-08-11 ENCOUNTER — Ambulatory Visit

## 2024-08-12 ENCOUNTER — Inpatient Hospital Stay: Admitting: Oncology

## 2024-08-12 ENCOUNTER — Ambulatory Visit

## 2024-08-12 ENCOUNTER — Inpatient Hospital Stay

## 2024-08-13 ENCOUNTER — Ambulatory Visit

## 2024-08-16 ENCOUNTER — Ambulatory Visit

## 2024-08-17 ENCOUNTER — Ambulatory Visit

## 2024-08-18 ENCOUNTER — Ambulatory Visit

## 2024-08-19 ENCOUNTER — Inpatient Hospital Stay: Attending: Oncology | Admitting: Oncology

## 2024-08-19 ENCOUNTER — Ambulatory Visit

## 2024-08-20 ENCOUNTER — Ambulatory Visit

## 2024-08-23 ENCOUNTER — Ambulatory Visit

## 2024-08-24 ENCOUNTER — Ambulatory Visit

## 2024-08-25 ENCOUNTER — Ambulatory Visit

## 2024-08-26 ENCOUNTER — Ambulatory Visit

## 2024-09-02 ENCOUNTER — Ambulatory Visit: Admitting: Cardiology

## 2024-09-27 ENCOUNTER — Encounter

## 2024-11-16 ENCOUNTER — Ambulatory Visit: Admitting: Cardiovascular Disease

## 2024-12-27 ENCOUNTER — Encounter

## 2025-03-17 ENCOUNTER — Ambulatory Visit

## 2025-03-17 ENCOUNTER — Ambulatory Visit: Admitting: Family Medicine
